# Patient Record
Sex: Female | Born: 1945 | Race: White | Hispanic: No | Smoking: Former smoker
Health system: Southern US, Community
[De-identification: ages and names within clinical notes are randomized; demographics above are authoritative.]

## PROBLEM LIST (undated history)

## (undated) DIAGNOSIS — Z7901 Long term (current) use of anticoagulants: Secondary | ICD-10-CM

## (undated) DIAGNOSIS — K219 Gastro-esophageal reflux disease without esophagitis: Secondary | ICD-10-CM

## (undated) DIAGNOSIS — K589 Irritable bowel syndrome without diarrhea: Secondary | ICD-10-CM

## (undated) DIAGNOSIS — F329 Major depressive disorder, single episode, unspecified: Secondary | ICD-10-CM

## (undated) DIAGNOSIS — F419 Anxiety disorder, unspecified: Secondary | ICD-10-CM

## (undated) DIAGNOSIS — K644 Residual hemorrhoidal skin tags: Secondary | ICD-10-CM

## (undated) DIAGNOSIS — K5790 Diverticulosis of intestine, part unspecified, without perforation or abscess without bleeding: Secondary | ICD-10-CM

## (undated) DIAGNOSIS — I480 Paroxysmal atrial fibrillation: Secondary | ICD-10-CM

## (undated) DIAGNOSIS — K209 Esophagitis, unspecified: Secondary | ICD-10-CM

## (undated) DIAGNOSIS — F32A Depression, unspecified: Secondary | ICD-10-CM

## (undated) DIAGNOSIS — I10 Essential (primary) hypertension: Secondary | ICD-10-CM

## (undated) DIAGNOSIS — T4145XA Adverse effect of unspecified anesthetic, initial encounter: Secondary | ICD-10-CM

## (undated) DIAGNOSIS — M1990 Unspecified osteoarthritis, unspecified site: Secondary | ICD-10-CM

## (undated) DIAGNOSIS — I495 Sick sinus syndrome: Secondary | ICD-10-CM

## (undated) DIAGNOSIS — Z950 Presence of cardiac pacemaker: Secondary | ICD-10-CM

## (undated) DIAGNOSIS — L03317 Cellulitis of buttock: Secondary | ICD-10-CM

## (undated) DIAGNOSIS — E039 Hypothyroidism, unspecified: Secondary | ICD-10-CM

## (undated) DIAGNOSIS — T8859XA Other complications of anesthesia, initial encounter: Secondary | ICD-10-CM

## (undated) DIAGNOSIS — Z9289 Personal history of other medical treatment: Secondary | ICD-10-CM

## (undated) HISTORY — DX: Residual hemorrhoidal skin tags: K64.4

## (undated) HISTORY — PX: OVARIAN CYST REMOVAL: SHX89

## (undated) HISTORY — PX: CATARACT EXTRACTION, BILATERAL: SHX1313

## (undated) HISTORY — DX: Esophagitis, unspecified: K20.9

## (undated) HISTORY — DX: Depression, unspecified: F32.A

## (undated) HISTORY — PX: TONSILLECTOMY: SUR1361

## (undated) HISTORY — PX: FACIAL COSMETIC SURGERY: SHX629

## (undated) HISTORY — DX: Major depressive disorder, single episode, unspecified: F32.9

## (undated) HISTORY — PX: VAGINAL HYSTERECTOMY: SUR661

## (undated) HISTORY — PX: BREAST REDUCTION SURGERY: SHX8

## (undated) HISTORY — PX: FOOT SURGERY: SHX648

## (undated) HISTORY — DX: Irritable bowel syndrome, unspecified: K58.9

## (undated) HISTORY — PX: LIPOSUCTION: SHX10

## (undated) HISTORY — PX: HAND SURGERY: SHX662

## (undated) HISTORY — DX: Diverticulosis of intestine, part unspecified, without perforation or abscess without bleeding: K57.90

---

## 1999-08-08 ENCOUNTER — Encounter: Admission: RE | Admit: 1999-08-08 | Discharge: 1999-08-08 | Payer: Self-pay | Admitting: Orthopedic Surgery

## 1999-08-08 ENCOUNTER — Encounter: Payer: Self-pay | Admitting: Orthopedic Surgery

## 2005-01-10 ENCOUNTER — Emergency Department (HOSPITAL_COMMUNITY): Admission: RE | Admit: 2005-01-10 | Discharge: 2005-01-11 | Payer: Self-pay | Admitting: Internal Medicine

## 2006-04-07 ENCOUNTER — Ambulatory Visit: Payer: Self-pay | Admitting: Gastroenterology

## 2006-07-20 ENCOUNTER — Ambulatory Visit: Payer: Self-pay | Admitting: Gastroenterology

## 2006-09-17 ENCOUNTER — Encounter (INDEPENDENT_AMBULATORY_CARE_PROVIDER_SITE_OTHER): Payer: Self-pay | Admitting: *Deleted

## 2006-09-17 ENCOUNTER — Ambulatory Visit: Payer: Self-pay | Admitting: Gastroenterology

## 2006-09-17 DIAGNOSIS — K209 Esophagitis, unspecified without bleeding: Secondary | ICD-10-CM

## 2006-09-17 HISTORY — DX: Esophagitis, unspecified without bleeding: K20.90

## 2006-12-18 ENCOUNTER — Encounter: Payer: Self-pay | Admitting: Emergency Medicine

## 2006-12-18 ENCOUNTER — Inpatient Hospital Stay (HOSPITAL_COMMUNITY): Admission: EM | Admit: 2006-12-18 | Discharge: 2006-12-20 | Payer: Self-pay | Admitting: Cardiovascular Disease

## 2006-12-29 HISTORY — PX: NM MYOCAR PERF WALL MOTION: HXRAD629

## 2006-12-30 ENCOUNTER — Observation Stay (HOSPITAL_COMMUNITY): Admission: EM | Admit: 2006-12-30 | Discharge: 2006-12-31 | Payer: Self-pay | Admitting: Emergency Medicine

## 2006-12-30 ENCOUNTER — Encounter (INDEPENDENT_AMBULATORY_CARE_PROVIDER_SITE_OTHER): Payer: Self-pay | Admitting: Cardiovascular Disease

## 2008-10-13 IMAGING — CR DG CHEST 1V PORT
1 series · 1 of 1 positions shown · non-contrast
Comparison: None.

CLINICAL DATA: Shortness of breath. 

PORTABLE CHEST - 1 VIEW

[view not recorded]
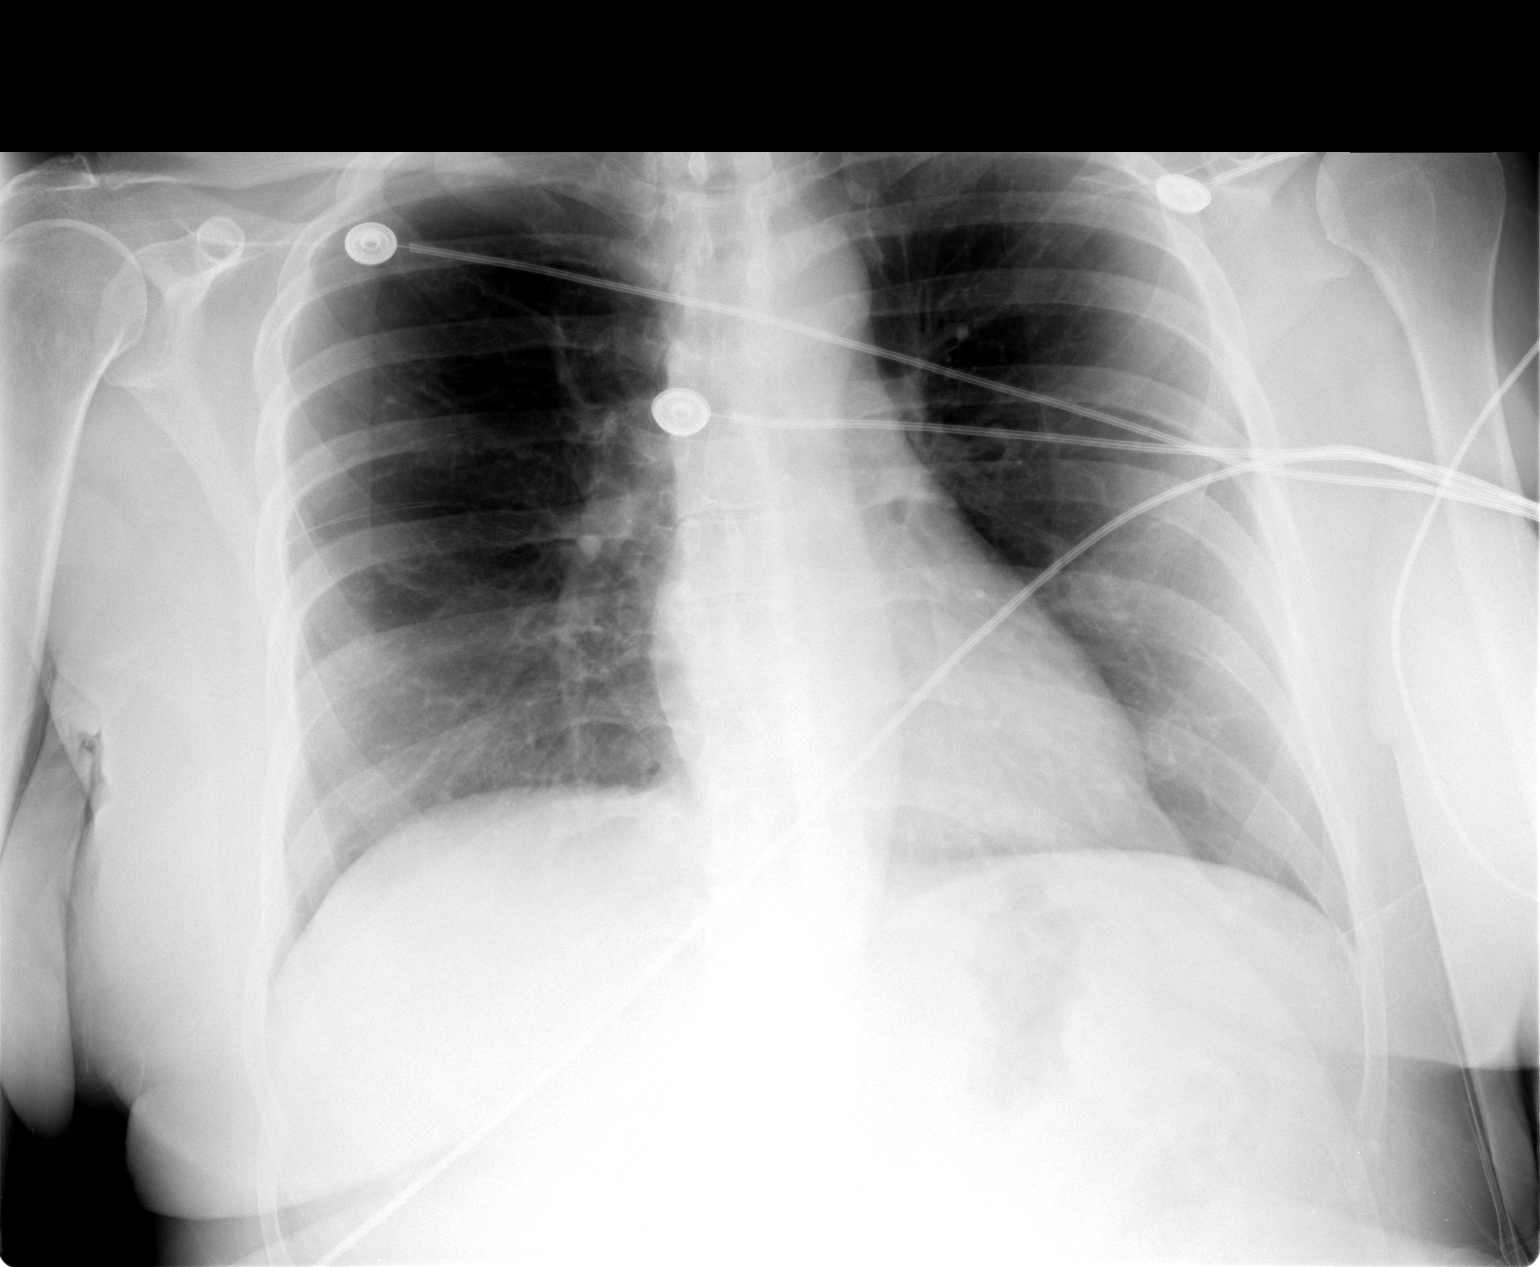

[1 of 1 positions shown; findings below may reference images not displayed]

FINDINGS: Normal sized heart. Clear lungs with normal vascularity. Mildly
tortuous aorta. Minimal scoliosis.  

IMPRESSION

No acute abnormality.

## 2008-12-29 ENCOUNTER — Emergency Department (HOSPITAL_COMMUNITY): Admission: EM | Admit: 2008-12-29 | Discharge: 2008-12-29 | Payer: Self-pay | Admitting: Emergency Medicine

## 2009-12-02 ENCOUNTER — Emergency Department (HOSPITAL_COMMUNITY): Admission: EM | Admit: 2009-12-02 | Discharge: 2009-12-02 | Payer: Self-pay | Admitting: Emergency Medicine

## 2009-12-09 ENCOUNTER — Emergency Department (HOSPITAL_COMMUNITY): Admission: EM | Admit: 2009-12-09 | Discharge: 2009-12-09 | Payer: Self-pay | Admitting: Family Medicine

## 2010-09-01 LAB — CBC
HCT: 37.5 % (ref 36.0–46.0)
Hemoglobin: 12.9 g/dL (ref 12.0–15.0)
MCHC: 34.4 g/dL (ref 30.0–36.0)
RDW: 13.7 % (ref 11.5–15.5)

## 2010-09-01 LAB — DIFFERENTIAL
Basophils Absolute: 0 10*3/uL (ref 0.0–0.1)
Basophils Relative: 1 % (ref 0–1)
Monocytes Relative: 13 % — ABNORMAL HIGH (ref 3–12)
Neutro Abs: 2.7 10*3/uL (ref 1.7–7.7)
Neutrophils Relative %: 56 % (ref 43–77)

## 2010-09-01 LAB — POCT I-STAT, CHEM 8
BUN: 17 mg/dL (ref 6–23)
Calcium, Ion: 1.15 mmol/L (ref 1.12–1.32)
Creatinine, Ser: 1 mg/dL (ref 0.4–1.2)
TCO2: 22 mmol/L (ref 0–100)

## 2010-09-01 LAB — COMPREHENSIVE METABOLIC PANEL
Alkaline Phosphatase: 25 U/L — ABNORMAL LOW (ref 39–117)
BUN: 14 mg/dL (ref 6–23)
Calcium: 9.1 mg/dL (ref 8.4–10.5)
GFR calc non Af Amer: >60 mL/min (ref >60)
Glucose, Bld: 100 mg/dL — ABNORMAL HIGH (ref 70–99)
Potassium: 4.5 mEq/L (ref 3.5–5.1)
Total Protein: 6.1 g/dL (ref 6.0–8.3)

## 2010-09-01 LAB — APTT: aPTT: 26 seconds (ref 24–37)

## 2010-09-01 LAB — PROTIME-INR
INR: 1.28 (ref 0.00–1.49)
Prothrombin Time: 15.9 seconds — ABNORMAL HIGH (ref 11.6–15.2)

## 2010-09-01 LAB — POCT CARDIAC MARKERS: Myoglobin, poc: 49 ng/mL (ref 12–200)

## 2010-09-22 LAB — COMPREHENSIVE METABOLIC PANEL
ALT: 17 U/L (ref 0–35)
AST: 27 U/L (ref 0–37)
Alkaline Phosphatase: 33 U/L — ABNORMAL LOW (ref 39–117)
CO2: 24 mEq/L (ref 19–32)
Chloride: 105 mEq/L (ref 96–112)
GFR calc non Af Amer: >60 mL/min (ref >60)
Glucose, Bld: 96 mg/dL (ref 70–99)
Potassium: 4.2 mEq/L (ref 3.5–5.1)
Sodium: 137 mEq/L (ref 135–145)
Total Bilirubin: 0.3 mg/dL (ref 0.3–1.2)

## 2010-09-22 LAB — PROTIME-INR: Prothrombin Time: 23.5 seconds — ABNORMAL HIGH (ref 11.6–15.2)

## 2010-10-29 NOTE — H&P (Signed)
Jacqueline Kim, Jacqueline Kim                ACCOUNT NO.:  [id number]   MEDICAL RECORD NO.:  [id number]          PATIENT TYPE:  INP   LOCATION:  1823                         FACILITY:  [facility]   PHYSICIAN:  Ulyses Amor, MD DATE OF BIRTH:  01-05-1946   DATE OF ADMISSION:  12/30/2006  DATE OF DISCHARGE:                              HISTORY & PHYSICAL   HISTORY OF PRESENT ILLNESS:  Jacqueline Kim is a 65 year old white woman  who is again admitted to [facility] with a recurrence of atrial  fibrillation with a rapid ventricular rate.  She was last hospitalized  here July 4-6, 2008, for the same issue.  This will make the patient's  third episode of paroxysmal atrial fibrillation.   The patient experienced onset of a rapid and irregular heartbeat this  evening soon after returning home.  There was no associated chest pain,  tightness, heaviness, pressure or squeezing.  No wheezing or any  dyspnea, diaphoresis, nausea, dizziness, lightheadedness, near syncope  or syncope.  She presented to the emergency department where she was  found to be in atrial fibrillation with a rapid ventricular rate.   The patient has no known history of coronary artery disease.  In fact,  she underwent nuclear stress testing yesterday.  The results are  pending.  She does have a history of hypertension.  There is no history  of diabetes mellitus, smoking or dyslipidemia.  There is a family  history of coronary artery disease.  Since presenting to the emergency  department, she has been treated with intravenous Diltiazem which has  resulted in a slowing of her ventricular rate.   OTHER MEDICAL PROBLEMS:  1. Hypothyroidism.  2. Gastroesophageal reflux disease.  3. Glaucoma.   ALLERGIES:  PENICILLIN.   MEDICATIONS:  1. Premarin.  2. Wellbutrin XL.  3. Cozaar.  4. Amlodipine.  5. Toprol XL.  6. Synthroid .  7. Prevacid.  8. Xalatan eye drops.  9. Trusopt eye drops.   SOCIAL HISTORY:  The patient is  married.  She has three children.  She  does not smoke cigarettes.  She drinks occasional alcohol.   FAMILY HISTORY:  Notable for coronary artery disease.   OPERATIONS:  1. Breast surgery.  2. Hand surgery.  3. Left foot surgery.  4. Partial hysterectomy .  5. Tonsillectomy and adenoidectomy.   REVIEW OF SYSTEMS:  No new problems related to her Head, Eyes, Ears,  Nose, Mouth, Throat, Lungs, Gastrointestinal, Genitourinary,  Extremities.  There is no history of neurological/psychiatric disorder.  There is no history of fever, chills or weight loss.   PHYSICAL EXAMINATION:  VITAL SIGNS:  Blood pressure 118/87, pulse 70 and  irregularly irregular, respirations 18, temperature 97.2.  GENERAL:  The patient was a middle age white woman in no discomfort.  She as alert, oriented, appropriate and responsive.  HEENT:  Normal.  NECK:  Without thyromegaly or adenopathy.  Carotid pulses were palpable  bilaterally and without bruits.  CARDIAC:  Irregularly irregular rhythm.  No murmurs, rubs, gallops or  clicks.  No chest wall tenderness was noted.  LUNGS:  Clear.  ABDOMEN:  Soft and nontender.  No mass, hepatosplenomegaly, bruits,  distention, rebound, guarding or rigidity.  Bowel sounds were normal.  BREASTS/PELVIC/RECTAL:  Not performed as not pertinent to the recent  acute care hospitalization.  EXTREMITIES:  Without edema, deviation or deformity.  Radial and  dorsalis pedal pulses were palpable bilaterally.  Brief screening at  neurologic survey was unremarkable.   STUDIES:  Electrocardiogram reveals atrial fibrillation with a  ventricular rate of 132 beats per minute, given nonspecific ST-T wave  changes, probably related to rate.  Potassium 3.2, BUN 11, creatinine  0.8.  Initial set of cardiac markers revealed myoglobin of 34.7, CK MB  less than 1.0, troponin less than 0.05.  Chest radiograph was pending at  the time of this dictation.  The remaining laboratory tests were pending   at the time of this dictation.   IMPRESSION:  1. Recurrent atrial fibrillation with rapid ventricular rate.  History      of paroxysmal atrial fibrillation.  Last hospitalization for atrial      fibrillation was July 4-6, 2008.  2. Hypertension.  3. Hypothyroidism.  4. Gastroesophageal reflux disease.  5. Glaucoma.   PLAN:  1. Telemetry.  2. Serial cardiac enzymes.  3. Aspirin.  4. Intravenous heparin.  5. Intravenous diltiazem.  6. Further measures per Dr. [name].      Ulyses Amor, MD  Electronically Signed     MSC/MEDQ  D:  12/30/2006  T:  12/30/2006  Job:  [id number]   cc:   Nicki Guadalajara, M.D.

## 2010-10-29 NOTE — Discharge Summary (Signed)
Jacqueline Kim, Jacqueline Kim                ACCOUNT NO.:  [id number]   MEDICAL RECORD NO.:  [id number]          PATIENT TYPE:  INP   LOCATION:  2901                         FACILITY:  [facility]   PHYSICIAN:  Dani Gobble, MD       DATE OF BIRTH:  14-May-1946   DATE OF ADMISSION:  12/18/2006  DATE OF DISCHARGE:  12/20/2006                               DISCHARGE SUMMARY   The patient is a 65 year old white married female patient of Dr. Nicki Guadalajara with known paroxysmal atrial fibrillation and hypertension who  came here to [facility] ER with complaints of tachy palpitations.  She  was found to be in atrial fib with rapid ventricular response.  She was  given IV Lopressor 5 mg and did not slow her rhythm and she was then  started on Cardizem. Her heart rate came down after 15 mg an hour. Later  that evening she was transferred to [facility]. At about 7:20, she  apparently converted. However she had a 6-second pause and then a heart  rate in the 30s. Her IV Diltiazem had been at 10.  It was discontinued,  she was given 1/2 Amp atropine and she was seen by Dr. Domingo Sep. She was  then moved to [employer]. Her Toprol was held.  However, she did receive it  December 19, 2006, and her heart rate maintained in the rate of 50-60 and  when she got up and walked it increased to 70.  Her blood pressure was  elevated and her Cozaar was increased and then amlodipine was added on  December 20, 2006. She is pending discharge this afternoon after she gets up  and walks. Her heart rate has been 43 to 50 per nurse at times, this  morning otherwise is running 58-60.  We will decrease his Toprol to 25  mg a day.  Her blood pressure also ranges at rest from 105-130 and Dr.  Domingo Sep suggests outpatient workup including possible catheterization  versus outpatient nuclear test, a 2-D echo and renal Dopplers. All tests  will be deferred to Dr. [name]. She will see him in the office first.   DISCHARGE MEDICATIONS:  1. Premarin 0.3 mg a  day.  2. Wellbutrin XL 300 mg a day.  3. Cozaar 50 mg twice a day.  4. Amlodipine 2.5 mg a day.  5. Toprol XL 25 mg a day.  6. Synthroid 100 mg a day.  7. Prevacid 30 mg a day.  8. Xalatan eye drops both eyes daily.  9. Trusopt eye drops both eyes twice daily.   LABS:  Sodium 142, potassium 3.5, BUN 9, creatinine 0.64, chloride 111,  CO2 27. Her hemoglobin was 11.6, hematocrit 33.8, WBCs 4.7 and platelets  were 197.  Glycosylated hemoglobin was 5.4, CK-MB and troponins were all  negative. Total cholesterol 172, triglycerides 293, HDL was 37, LDL was  76. TSH was 1.886, T3 uptake was 45 which is elevated, normal high range  is 37, and free T4 1.20.   DISCHARGE DIAGNOSES:  1. A paroxysmal atrial fibrillation with rapid ventricular response  recurring.  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  3. Hypertension.  Medications titrated upward.  4. Stress anxiety.  5. Hypothyroidism.  6. Gastroesophageal reflux disease.  7. Chest pain and shortness of breath with palpitations. CK-MB and      troponin negative.  8. History of glaucoma.      Lezlie Octave, N.P.    ______________________________  Dani Gobble, MD    BB/MEDQ  D:  12/20/2006  T:  12/20/2006  Job:  [id number]

## 2010-10-29 NOTE — H&P (Signed)
Jacqueline Kim                ACCOUNT NO.:  [id number]   MEDICAL RECORD NO.:  [id number]          PATIENT TYPE:  OBV   LOCATION:  6523                         FACILITY:  [facility]   PHYSICIAN:  Jacqueline Kim, M.D.     DATE OF BIRTH:  1946/05/20   DATE OF ADMISSION:  12/30/2006  DATE OF DISCHARGE:  12/31/2006                              HISTORY & PHYSICAL   HISTORY OF PRESENT ILLNESS:  Jacqueline Kim is a 65 year old female patient  of Dr. Nicki Kim who came into the hospital again with atrial  fibrillation, rapid ventricular response.  He was seen by Dr. Waldon Kim  who was on call for our service.  She was placed on IV heparin.  She was  given Diltiazem, which slowed her rate.  She was seen by Dr. Tresa Kim and  placed on propafenone 150 mg 2 times a day.  She had converted to sinus  rhythm prior to being started on propafenone.  Her IV Diltiazem was  discontinued.  She underwent 2D echocardiogram pending at this time.  She recently has had a Cardiolite study in the office, which was  negative for any ischemia.  She was seen again by Dr. Tresa Kim on December 31, 2006.  However, her ECG showed normal sinus rhythm.  Her QTc was 469.  Her sodium was 140, her potassium was 3.6.  BUN was 9, and creatinine  was 0.57.  Her blood pressure is 126/52, her pulse is 56, O2 sats were  96%.  Now Dr. Tresa Kim decided to give her a Lovenox injection and continue  her Coumadin that had been started the day before as an outpatient.  She  will not need Lovenox-Coumadin crossover though.  She is maintaining  sinus rhythm.  He will see her in the office on Monday for an EKG.  She  will also need a pro time, INR next week.   DISCHARGE MEDICATIONS:  1. Coumadin 5 mg every day.  2. Premarin 0.3 mg every day.  3. Wellbutrin XL 300 mg every day.  4. Synthroid 100 mcg every day.  5. Aspirin 81 mg every day.  6. Propafenone 150 mg 2 times per day.  7. Cozaar 50 mg every day.  8. Toprol XL 25 mg a day.  9. Xalatan and  Trusopt eye drops, both eyes daily.  Trusopt is twice a      day.  10.Prevacid 30 mg a day.    She will have her blood drawn Monday for Coumadin dosing.  Our office will call her with an appointment to see Dr. Tresa Kim next week,  early.   DISCHARGE DIAGNOSES:  1. Recurrent paroxysmal atrial fibrillation with rapid ventricular      response, now put on antiarrhythmic medication and propafenone.      She is in sinus rhythm at the time of discharge.  2. Anticoagulation:  She was started on Coumadin.  She will have had 2      days of Coumadin by the end of today.  Discharged home on 5 mg a      day.  3. Hypertension.  4. Anxiety.  5. Hypothyroidism.  6. Gastroesophageal reflux disease.  7. Glaucoma.      Jacqueline Kim, N.P.    ______________________________  Jacqueline Kim, M.D.    BB/MEDQ  D:  12/31/2006  T:  01/01/2007  Job:  [id number]   cc:   Jacqueline Kim. Jacqueline Kim, M.D.

## 2010-11-01 NOTE — Assessment & Plan Note (Signed)
[facility]                           GASTROENTEROLOGY OFFICE NOTE   Jacqueline, Kim              MRN:          [id number]  DATE:04/07/2006                            DOB:          1945-09-07    Jacqueline Kim is a patient of Dr. Merri Brunette.   Jacqueline Kim is a patient I have seen in the past and did several procedures on  her.  Unfortunately, I do not have her chart available to me to review her  medical situation at the time, and the results of the procedure.  I will  obtain those, however, to accomplish this.  In any case, she comes in now,  she states that she has had some abdominal pain intermittently with gas and  bloating.  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  She denies any other GI symptoms.  Denies any upper GI  symptoms.   Her family history is noncontributory.   Her past medical history reveals that she has hypertension and has had some  arrhythmias diagnosed six months previous.  She is treated for thyroid  disorder, has some arthritis, and is otherwise in fairly good health.   The social history is only positive for some occasional alcohol.   Review of systems reveals some arthritis, some swelling of her legs,  otherwise is noncontributory.   PHYSICAL EXAMINATION:  She is 5 feet 5-1/2 inches.  Weight is 159.  Blood  pressure 122/70.  Pulse 74 and regular.  Neck, head and extremities are all  basically unremarkable except for her abdomen being somewhat tender to  palpation diffusely.  She had no lymphadenopathy.  Her supraclavicular area  was negative.  Extremities were unremarkable.  Rectal was deferred.   IMPRESSION:  1. Probable irritable bowel syndrome.  2. Minimal gastroesophageal reflux disease symptoms, on Prevacid.  3. Hypertension.  4. Hypothyroidism.  5. Some mild anxiety and depression.  6.  Hypertriglyceridemia.  7. History of allergy to PENICILLIN.  8. Status post tonsillectomy, ovarian cystectomy, foot surgery,      liposuction, multiple hand surgeries, breast reduction, and face lift.   It should be noted that a CT scan was done because of her complaint of  periumbilical pain and showed no  acute findings in the pelvis or the  abdomen.   My recommendation was that we scheduled her for an endo/colon, but I first  would review her previous records.  In the meantime, I would keep her on the  Prevacid or other medications, which include Corgard, Toprol, Prevacid,  Wellbutrin, Synthroid, Premarin, eye drops, multivitamins, fish oil,  Maxzide, and folic acid.            ______________________________  Ulyess Mort, MD      SML/MedQ  DD:  04/07/2006  DT:  04/08/2006  Job #:  [id number]   cc:   Soyla Murphy. Renne Crigler, M.D.

## 2010-11-06 ENCOUNTER — Other Ambulatory Visit: Payer: Self-pay | Admitting: Internal Medicine

## 2010-11-07 ENCOUNTER — Ambulatory Visit
Admission: RE | Admit: 2010-11-07 | Discharge: 2010-11-07 | Disposition: A | Discharge status: Home / Self Care | Payer: Medicare Other | Source: Ambulatory Visit | Attending: Internal Medicine | Admitting: Internal Medicine

## 2010-11-07 MED ORDER — IOHEXOL 300 MG/ML  SOLN
100.0000 mL | Freq: Once | INTRAMUSCULAR | Status: AC | PRN
  Administered 2010-11-07: 100 mL via INTRAVENOUS

## 2011-02-08 ENCOUNTER — Emergency Department (HOSPITAL_COMMUNITY): Payer: Medicare Other

## 2011-02-08 ENCOUNTER — Emergency Department (HOSPITAL_COMMUNITY)
Admission: EM | Admit: 2011-02-08 | Discharge: 2011-02-08 | Disposition: A | Discharge status: Home / Self Care | Payer: Medicare Other | Attending: Emergency Medicine | Admitting: Emergency Medicine

## 2011-02-08 DIAGNOSIS — R000 Tachycardia, unspecified: Secondary | ICD-10-CM | POA: Insufficient documentation

## 2011-02-08 DIAGNOSIS — F329 Major depressive disorder, single episode, unspecified: Secondary | ICD-10-CM | POA: Insufficient documentation

## 2011-02-08 DIAGNOSIS — R9431 Abnormal electrocardiogram [ECG] [EKG]: Secondary | ICD-10-CM | POA: Insufficient documentation

## 2011-02-08 DIAGNOSIS — I10 Essential (primary) hypertension: Secondary | ICD-10-CM | POA: Insufficient documentation

## 2011-02-08 DIAGNOSIS — R42 Dizziness and giddiness: Secondary | ICD-10-CM | POA: Insufficient documentation

## 2011-02-08 DIAGNOSIS — I4891 Unspecified atrial fibrillation: Secondary | ICD-10-CM | POA: Insufficient documentation

## 2011-02-08 DIAGNOSIS — R259 Unspecified abnormal involuntary movements: Secondary | ICD-10-CM | POA: Insufficient documentation

## 2011-02-08 DIAGNOSIS — F3289 Other specified depressive episodes: Secondary | ICD-10-CM | POA: Insufficient documentation

## 2011-02-08 DIAGNOSIS — E039 Hypothyroidism, unspecified: Secondary | ICD-10-CM | POA: Insufficient documentation

## 2011-02-08 DIAGNOSIS — Z79899 Other long term (current) drug therapy: Secondary | ICD-10-CM | POA: Insufficient documentation

## 2011-02-08 LAB — CBC
HCT: 34.9 % — ABNORMAL LOW (ref 36.0–46.0)
Hemoglobin: 12.3 g/dL (ref 12.0–15.0)
MCH: 33 pg (ref 26.0–34.0)
MCHC: 35.2 g/dL (ref 30.0–36.0)
MCV: 93.6 fL (ref 78.0–100.0)
Platelets: 222 K/uL (ref 150–400)
RBC: 3.73 MIL/uL — ABNORMAL LOW (ref 3.87–5.11)
RDW: 13 % (ref 11.5–15.5)
WBC: 4.5 K/uL (ref 4.0–10.5)

## 2011-02-08 LAB — CK TOTAL AND CKMB (NOT AT ARMC): CK, MB: 3.8 ng/mL (ref 0.3–4.0)

## 2011-02-08 LAB — URINALYSIS, MICROSCOPIC ONLY
Glucose, UA: NEGATIVE mg/dL
Ketones, ur: NEGATIVE mg/dL
Leukocytes, UA: NEGATIVE
Nitrite: NEGATIVE
Protein, ur: NEGATIVE mg/dL
pH: 5.5 (ref 5.0–8.0)

## 2011-02-08 LAB — BASIC METABOLIC PANEL
CO2: 24 mEq/L (ref 19–32)
Calcium: 9.7 mg/dL (ref 8.4–10.5)
Chloride: 102 mEq/L (ref 96–112)
Creatinine, Ser: 0.54 mg/dL (ref 0.50–1.10)
Glucose, Bld: 100 mg/dL — ABNORMAL HIGH (ref 70–99)

## 2011-02-08 LAB — TROPONIN I: Troponin I: <0.3 ng/mL (ref <0.30)

## 2011-03-31 LAB — B-NATRIURETIC PEPTIDE (CONVERTED LAB): Pro B Natriuretic peptide (BNP): 125 — ABNORMAL HIGH

## 2011-03-31 LAB — CBC
Hemoglobin: 11.3 — ABNORMAL LOW
Hemoglobin: 11.9 — ABNORMAL LOW
Hemoglobin: 12
MCHC: 33.9
MCHC: 34
Platelets: 243
Platelets: 262
RDW: 13.2
RDW: 13.2
RDW: 13.3

## 2011-03-31 LAB — I-STAT 8, (EC8 V) (CONVERTED LAB)
BUN: 11
Bicarbonate: 20.8
HCT: 39
Operator id: 282201
pCO2, Ven: 35.5 — ABNORMAL LOW
pH, Ven: 7.376 — ABNORMAL HIGH

## 2011-03-31 LAB — CK TOTAL AND CKMB (NOT AT ARMC)
CK, MB: 1.8
Relative Index: INVALID
Total CK: 69

## 2011-03-31 LAB — DIFFERENTIAL
Lymphocytes Relative: 43
Monocytes Absolute: 0.3
Monocytes Relative: 8
Neutro Abs: 1.7
Neutrophils Relative %: 43

## 2011-03-31 LAB — BASIC METABOLIC PANEL
CO2: 23
Calcium: 8.6
Creatinine, Ser: 0.57
GFR calc Af Amer: >60
GFR calc non Af Amer: >60
Glucose, Bld: 98
Sodium: 140

## 2011-03-31 LAB — COMPREHENSIVE METABOLIC PANEL
Albumin: 3.2 — ABNORMAL LOW
BUN: 10
Calcium: 8.6
Creatinine, Ser: 0.71
Total Protein: 5.6 — ABNORMAL LOW

## 2011-03-31 LAB — POCT CARDIAC MARKERS
CKMB, poc: <1 — ABNORMAL LOW
Myoglobin, poc: 34.7
Troponin i, poc: <0.05

## 2011-03-31 LAB — CARDIAC PANEL(CRET KIN+CKTOT+MB+TROPI)
CK, MB: 1.8
CK, MB: 1.8
Relative Index: INVALID
Total CK: 64

## 2011-03-31 LAB — PROTIME-INR
INR: 0.9
INR: 1
Prothrombin Time: 12.7
Prothrombin Time: 13.7

## 2011-03-31 LAB — TSH: TSH: 3.268

## 2011-03-31 LAB — POCT I-STAT CREATININE
Creatinine, Ser: 0.7
Creatinine, Ser: 0.8

## 2011-03-31 LAB — HEPARIN LEVEL (UNFRACTIONATED)
Heparin Unfractionated: 0.37
Heparin Unfractionated: 0.9 — ABNORMAL HIGH

## 2011-03-31 LAB — APTT: aPTT: 24

## 2011-04-01 ENCOUNTER — Observation Stay (HOSPITAL_COMMUNITY)
Admission: EM | Admit: 2011-04-01 | Discharge: 2011-04-02 | Disposition: A | Discharge status: Home / Self Care | Payer: Medicare Other | Attending: Cardiology | Admitting: Cardiology

## 2011-04-01 ENCOUNTER — Emergency Department (HOSPITAL_COMMUNITY): Payer: Medicare Other

## 2011-04-01 DIAGNOSIS — F329 Major depressive disorder, single episode, unspecified: Secondary | ICD-10-CM | POA: Insufficient documentation

## 2011-04-01 DIAGNOSIS — Z9119 Patient's noncompliance with other medical treatment and regimen: Secondary | ICD-10-CM | POA: Insufficient documentation

## 2011-04-01 DIAGNOSIS — F3289 Other specified depressive episodes: Secondary | ICD-10-CM | POA: Insufficient documentation

## 2011-04-01 DIAGNOSIS — Z91199 Patient's noncompliance with other medical treatment and regimen due to unspecified reason: Secondary | ICD-10-CM | POA: Insufficient documentation

## 2011-04-01 DIAGNOSIS — R002 Palpitations: Secondary | ICD-10-CM | POA: Insufficient documentation

## 2011-04-01 DIAGNOSIS — I4891 Unspecified atrial fibrillation: Principal | ICD-10-CM | POA: Insufficient documentation

## 2011-04-01 DIAGNOSIS — R0602 Shortness of breath: Secondary | ICD-10-CM | POA: Insufficient documentation

## 2011-04-01 DIAGNOSIS — E039 Hypothyroidism, unspecified: Secondary | ICD-10-CM | POA: Insufficient documentation

## 2011-04-01 DIAGNOSIS — F411 Generalized anxiety disorder: Secondary | ICD-10-CM | POA: Insufficient documentation

## 2011-04-01 DIAGNOSIS — Z7901 Long term (current) use of anticoagulants: Secondary | ICD-10-CM | POA: Insufficient documentation

## 2011-04-01 DIAGNOSIS — I10 Essential (primary) hypertension: Secondary | ICD-10-CM | POA: Insufficient documentation

## 2011-04-01 LAB — DIFFERENTIAL
Basophils Absolute: 0 10*3/uL (ref 0.0–0.1)
Basophils Relative: 1 % (ref 0–1)
Basophils Relative: 1
Eosinophils Absolute: 0.3 10*3/uL (ref 0.0–0.7)
Eosinophils Absolute: 0.2
Eosinophils Relative: 6 — ABNORMAL HIGH
Lymphocytes Relative: 40
Lymphs Abs: 1.9
Lymphs Abs: 1.9
Monocytes Absolute: 0.4
Monocytes Absolute: 0.4
Monocytes Relative: 15 % — ABNORMAL HIGH (ref 3–12)
Monocytes Relative: 8
Monocytes Relative: 9
Neutro Abs: 1 10*3/uL — ABNORMAL LOW (ref 1.7–7.7)
Neutro Abs: 1.7
Neutrophils Relative %: 33 % — ABNORMAL LOW (ref 43–77)
Neutrophils Relative %: 45

## 2011-04-01 LAB — CBC
HCT: 34.4 — ABNORMAL LOW
Hemoglobin: 12.7 g/dL (ref 12.0–15.0)
Hemoglobin: 11.6 — ABNORMAL LOW
Hemoglobin: 11.8 — ABNORMAL LOW
MCH: 32.4 pg (ref 26.0–34.0)
MCHC: 34.5
MCHC: 34.3
MCV: 90.5
MCV: 91.5
MCV: 92.5
Platelets: 201 10*3/uL (ref 150–400)
RBC: 3.92 MIL/uL (ref 3.87–5.11)
RBC: 4.19
RBC: 3.66 — ABNORMAL LOW
RBC: 3.75 — ABNORMAL LOW
RDW: 13.3
WBC: 3.1 10*3/uL — ABNORMAL LOW (ref 4.0–10.5)
WBC: 4.2

## 2011-04-01 LAB — BASIC METABOLIC PANEL
BUN: 15 mg/dL (ref 6–23)
BUN: 13
CO2: 24 mEq/L (ref 19–32)
CO2: 27
Calcium: 10.1 mg/dL (ref 8.4–10.5)
Calcium: 8.6
Chloride: 106
Chloride: 111
GFR calc Af Amer: >60
GFR calc Af Amer: >60
Glucose, Bld: 92 mg/dL (ref 70–99)
Potassium: 3.5
Potassium: 3.8
Sodium: 139 mEq/L (ref 135–145)
Sodium: 142

## 2011-04-01 LAB — POCT CARDIAC MARKERS
Myoglobin, poc: 34.6
Operator id: 1627
Troponin i, poc: <0.05

## 2011-04-01 LAB — LIPID PANEL
LDL Cholesterol: 76
Total CHOL/HDL Ratio: 4.6
VLDL: 59 — ABNORMAL HIGH

## 2011-04-01 LAB — COMPREHENSIVE METABOLIC PANEL
AST: 29
CO2: 26
Calcium: 9.2
Creatinine, Ser: 0.79
GFR calc Af Amer: >60
GFR calc non Af Amer: >60
Glucose, Bld: 116 — ABNORMAL HIGH
Total Protein: 6.3

## 2011-04-01 LAB — TSH
TSH: 1.293
TSH: 1.886
TSH: 9.156 u[IU]/mL — ABNORMAL HIGH (ref 0.350–4.500)

## 2011-04-01 LAB — CARDIAC PANEL(CRET KIN+CKTOT+MB+TROPI): CK, MB: 1.3

## 2011-04-01 LAB — CK TOTAL AND CKMB (NOT AT ARMC)
CK, MB: 3.2 ng/mL (ref 0.3–4.0)
CK, MB: 2.9 ng/mL (ref 0.3–4.0)
Relative Index: INVALID (ref 0.0–2.5)

## 2011-04-01 LAB — HEPATIC FUNCTION PANEL
Bilirubin, Direct: <0.1 mg/dL (ref 0.0–0.3)
Total Bilirubin: 0.3 mg/dL (ref 0.3–1.2)

## 2011-04-01 LAB — APTT: aPTT: 33 seconds (ref 24–37)

## 2011-04-01 LAB — POCT I-STAT TROPONIN I: Troponin i, poc: 0.01 ng/mL (ref 0.00–0.08)

## 2011-04-01 LAB — HEMOGLOBIN A1C: Mean Plasma Glucose: 105 mg/dL (ref <117)

## 2011-04-01 LAB — PROTIME-INR: INR: 1.12 (ref 0.00–1.49)

## 2011-04-01 LAB — TROPONIN I: Troponin I: <0.3 ng/mL (ref <0.30)

## 2011-04-02 LAB — CK TOTAL AND CKMB (NOT AT ARMC)
CK, MB: 2.5 ng/mL (ref 0.3–4.0)
Relative Index: INVALID (ref 0.0–2.5)

## 2011-04-02 LAB — CBC
HCT: 32.1 % — ABNORMAL LOW (ref 36.0–46.0)
Hemoglobin: 10.7 g/dL — ABNORMAL LOW (ref 12.0–15.0)
RBC: 3.32 MIL/uL — ABNORMAL LOW (ref 3.87–5.11)

## 2011-04-02 LAB — BASIC METABOLIC PANEL
CO2: 26 mEq/L (ref 19–32)
Glucose, Bld: 84 mg/dL (ref 70–99)
Potassium: 3.8 mEq/L (ref 3.5–5.1)
Sodium: 140 mEq/L (ref 135–145)

## 2011-04-02 LAB — TROPONIN I: Troponin I: <0.3 ng/mL (ref <0.30)

## 2011-04-04 NOTE — Discharge Summary (Signed)
Jacqueline Kim, Jacqueline Kim                ACCOUNT NO.:  [id number]  MEDICAL RECORD NO.:  [id number]  LOCATION:  2040                         FACILITY:  [facility]  PHYSICIAN:  Landry Corporal, MD DATE OF BIRTH:  10-Sep-1945  DATE OF ADMISSION:  04/01/2011 DATE OF DISCHARGE:  04/02/2011                              DISCHARGE SUMMARY   DISCHARGE DIAGNOSES: 1. Paroxysmal atrial fibrillation with rapid ventricular response with     conversion to sinus rhythm with an approximate 7.5-second pause. 2. Noncompliance with medications and missing doses of medications. 3. Hypothyroidism. 4. Second episode of paroxysmal atrial fibrillation this year, first     also followed with presumed pause that caused near syncope. 5. Anxiety.  DISCHARGE CONDITION:  Improved.  PROCEDURES:  None.  DISCHARGE MEDICATIONS:  See medication reconciliation.  We did not change any medications.  We did ask her to take medications as instructed.  DISCHARGE INSTRUCTIONS:  Activity as tolerated.  Heart-healthy diet. Follow up with Dr. [name] on previously arranged appointment, April 23, 2011.  HOSPITAL COURSE:  A 65 year old female who came to [facility] Emergency Room on April 01, 2011, after being awakened from sleep around 3 am with palpitations.  She was seen in the ER and started on IV Cardizem for atrial fibrillation with RVR.  She had shortness of breath and minimal chest discomfort with this, otherwise at rest, she was asymptomatic.  With activity, she had dyspnea on exertion.  She had seen Dr. Alanda Amass who was on-call for Dr. [name] in the office after her most recent episode of AFib in August 2012.  Dr. Alanda Amass related in his note that he would prefer the patient to be admitted overnight if she had any recurrent atrial fibrillation, so we could monitor her conversion because she states they are very profound, and she almost passed out.  In the emergency room, she received 10-mg bolus of Cardizem and  a drip at 5 mg.  We then increased it to 10 mg but when she was admitted to tele bed, she would have tachycardia and then would have up to 2-second pauses, so we decreased the rate of the Cardizem to 5 mg an hour.  She was restarted on her usual dose of Rythmol 150 t.i.d. and metoprolol 25 b.i.d. and continued on her Pradaxa.  She then converted later that day to sinus bradycardia and then up to sinus rhythm.  She does relate at home that her pulse normally runs in the 50s, sometimes she is just very fatigued with that and this is probably why she does not take her medications appropriately.  On her Rythmol, she is only taking it twice a day instead of 3 times a day.  On the metoprolol, she forgot to get it refilled and then forgot to pick it up once it was refilled, so she missed a total of 5 doses of Lopressor.  The patient is stable on the morning of October, 17, 2012, and was seen by Dr. Clarene Duke.  He discussed with her possibility of permanent pacemaker with long pauses of conversions.  Strips of that conversion will be taken to Dr. [name], so when he sees her in the office, he  will decide if he wants her to proceed with pacemaker.  LABORATORY DATA:  Sodium 140, potassium 3.8, BUN 14, creatinine 0.81, glucose 84.  Hemoglobin 10.7, hematocrit 32.1, platelets 174, WBC 3.5. TSH is elevated at 9.156.  She has had multiple thyroid adjustments.  We will have her follow up with Dr. Thea Silversmith for follow up of that. Hemoglobin A1c was 5.3.  Magnesium was 1.6.  We will give her a dose of magnesium prior to her discharge.  Dr. Clarene Duke saw her and assessed her and felt she was stable for discharge.  We will have her ambulate in the hall and she will be discharged home with follow up with Dr. [name]. Please note chest x-ray had minimal bronchitic changes, right basilar atelectasis.     Jacqueline Kim, N.P.   ______________________________ Landry Corporal, MD    LRI/MEDQ  D:   04/02/2011  T:  04/02/2011  Job:  [id number]  cc:   Nicki Guadalajara, M.D. Thayer Headings, M.D.  Electronically Signed by Nada Boozer N.P. on 04/03/2011 01:23:04 PM Electronically Signed by Bryan Lemma MD on 04/04/2011 03:14:18 PM

## 2011-04-06 ENCOUNTER — Inpatient Hospital Stay (HOSPITAL_COMMUNITY)
Admission: EM | Admit: 2011-04-06 | Discharge: 2011-04-09 | DRG: 244 | Disposition: A | Discharge status: Home / Self Care | Payer: Medicare Other | Attending: Internal Medicine | Admitting: Internal Medicine

## 2011-04-06 DIAGNOSIS — Z79899 Other long term (current) drug therapy: Secondary | ICD-10-CM

## 2011-04-06 DIAGNOSIS — I10 Essential (primary) hypertension: Secondary | ICD-10-CM | POA: Diagnosis present

## 2011-04-06 DIAGNOSIS — I4891 Unspecified atrial fibrillation: Secondary | ICD-10-CM | POA: Diagnosis present

## 2011-04-06 DIAGNOSIS — K219 Gastro-esophageal reflux disease without esophagitis: Secondary | ICD-10-CM | POA: Diagnosis present

## 2011-04-06 DIAGNOSIS — I498 Other specified cardiac arrhythmias: Principal | ICD-10-CM | POA: Diagnosis present

## 2011-04-06 DIAGNOSIS — E039 Hypothyroidism, unspecified: Secondary | ICD-10-CM | POA: Diagnosis present

## 2011-04-06 LAB — CBC
Hemoglobin: 11.3 g/dL — ABNORMAL LOW (ref 12.0–15.0)
MCHC: 33.7 g/dL (ref 30.0–36.0)
RBC: 3.52 MIL/uL — ABNORMAL LOW (ref 3.87–5.11)
WBC: 3.4 10*3/uL — ABNORMAL LOW (ref 4.0–10.5)

## 2011-04-06 LAB — COMPREHENSIVE METABOLIC PANEL
AST: 20 U/L (ref 0–37)
Albumin: 3.5 g/dL (ref 3.5–5.2)
Chloride: 102 mEq/L (ref 96–112)
Creatinine, Ser: 0.98 mg/dL (ref 0.50–1.10)
Total Bilirubin: 0.4 mg/dL (ref 0.3–1.2)

## 2011-04-06 LAB — DIFFERENTIAL
Basophils Absolute: 0 10*3/uL (ref 0.0–0.1)
Basophils Relative: 1 % (ref 0–1)
Monocytes Absolute: 0.4 10*3/uL (ref 0.1–1.0)
Neutro Abs: 1.4 10*3/uL — ABNORMAL LOW (ref 1.7–7.7)
Neutrophils Relative %: 40 % — ABNORMAL LOW (ref 43–77)

## 2011-04-06 LAB — POCT I-STAT TROPONIN I: Troponin i, poc: 0 ng/mL (ref 0.00–0.08)

## 2011-04-07 LAB — BASIC METABOLIC PANEL
BUN: 17 mg/dL (ref 6–23)
Chloride: 106 mEq/L (ref 96–112)
GFR calc Af Amer: 84 mL/min — ABNORMAL LOW (ref >90)
Potassium: 3.8 mEq/L (ref 3.5–5.1)

## 2011-04-07 LAB — PROTIME-INR
INR: 1.21 (ref 0.00–1.49)
Prothrombin Time: 15.6 seconds — ABNORMAL HIGH (ref 11.6–15.2)

## 2011-04-07 LAB — TSH: TSH: 8.342 u[IU]/mL — ABNORMAL HIGH (ref 0.350–4.500)

## 2011-04-07 LAB — APTT: aPTT: 38 seconds — ABNORMAL HIGH (ref 24–37)

## 2011-04-07 LAB — MAGNESIUM: Magnesium: 1.8 mg/dL (ref 1.5–2.5)

## 2011-04-08 HISTORY — PX: PACEMAKER INSERTION: SHX728

## 2011-04-08 LAB — CBC
Platelets: 201 10*3/uL (ref 150–400)
RBC: 3.7 MIL/uL — ABNORMAL LOW (ref 3.87–5.11)
WBC: 3.6 10*3/uL — ABNORMAL LOW (ref 4.0–10.5)

## 2011-04-08 LAB — BASIC METABOLIC PANEL
Calcium: 9.6 mg/dL (ref 8.4–10.5)
GFR calc non Af Amer: 85 mL/min — ABNORMAL LOW (ref >90)
Sodium: 140 mEq/L (ref 135–145)

## 2011-04-08 LAB — APTT: aPTT: 26 seconds (ref 24–37)

## 2011-04-08 LAB — PROTIME-INR
INR: 0.97 (ref 0.00–1.49)
Prothrombin Time: 13.1 seconds (ref 11.6–15.2)

## 2011-04-08 NOTE — H&P (Signed)
NAMEWYNDI, Jacqueline Kim                ACCOUNT NO.:  [id number]  MEDICAL RECORD NO.:  [id number]  LOCATION:  2041                         FACILITY:  [facility]  PHYSICIAN:  Italy Hilty, MD         DATE OF BIRTH:  1945-12-10  DATE OF ADMISSION:  04/06/2011 DATE OF DISCHARGE:                             HISTORY & PHYSICAL   CHIEF COMPLAINT:  Irregular heart rate, very dizzy.  HISTORY OF PRESENT ILLNESS:  A 65 year old white married female, just discharged from [facility] on April 02, 2011, after admission for paroxysmal AFib with RVR with hospital course of started on Cardizem drip at that time.  Her outpatient meds were restarted.  She had missed 5 doses of Lopressor and was only taking her sotalol twice a day instead of q.8 hours.  Later that same day, she converted to sinus rhythm  after a long pause of approximately 7.5 seconds, though there was a small junctional beat between these pauses I am not sure that it was hemodynamicallyhelpful to her.  Initially, she did well but on April 05, 2011, she called our office and stated she was out shopping and felt suddenly very very dizzy and thought she might pass out.  She thought she had gone back in the AFib and had come out  again, but she was not aware of any AFib.  She was offered to come to the emergency room versus follow up visit Monday, she wanted to wait until Monday if she could, she felt better after she got home and rested.  Then this morning she was up and about and was very dizzy.  Her husband stated she was having 2 beats and then no beat, so we asked her to come in for further evaluation.  She came in and is in symptomatic bradycardia with heart rates down to 39 at times.  When she is lying flat, she has no complaints.  At the last visit, we had discussed permanent pacemaker.  She does have sick sinus syndrome, i.e. tachybrady syndrome with both bradycardia and AFib with rapid ventricular response.  When she takes her  medications appropriately, she develops bradycardia.  Plan will be for permanent pacemaker on Tuesday of this week.  Additionally, the patient had some how come off her Synthroid for her hypothyroidism and on her last admission, her TSH was 9.  She has resumed her Thyroid.  PAST MEDICAL HISTORY:  Paroxysmal AFib as stated.  No coronary disease and had a negative nuclear stress test in July of 2008, with EF of 69%.  Her last echo in May of 2010 revealed mild mitral regurg, mild tricuspid regurg, EF was 55%.  Other history includes hypothyroidism as stated previously, hypertension, and gastroesophageal reflux disease.  She had a history of glaucoma, but after cataracts were removed, her pressures have been stable.  ALLERGY:  Anaphylaxis with PENICILLIN, AMBIEN she cannot take.  FAMILY HISTORY:  No coronary disease.  SOCIAL HISTORY:  Married with children and grandchildren.  Does not use tobacco or alcohol.  She is active, but does not follow exercise plans.  REVIEW OF SYSTEMS:  GENERAL:  Very dizzy today, lightheaded secondary to arrhythmia.  Otherwise no complaints.  No colds or fevers.  SKIN:  No rashes or ulcers.  HEENT:  No blurred vision.  GI:  No diarrhea, constipation, or melena.  GU: No hematuria or dysuria.  ENDOCRINE:  With elevated TSH, she had somehow come off her Synthroid.  No history of diabetes.  MUSCULOSKELETAL:  Negative.  CARDIOVASCULAR:  As stated. PULMONARY:  Negative for shortness of breath.  NEUROLOGIC:  No syncope.  OUTPATIENT MEDICATIONS: 1. Pradaxa 150 mg p.o. b.i.d. 2. Losartan 100 mg half a tablet daily. 3. Metoprolol tartrate 25 mg b.i.d. 4. Bupropion XL 300 mg daily. 5. Rythmol 150 mg 1 every 8 hours. 6. Synthroid 0.1 mg daily. 7. Sonata 10 mg 1 p.o. at bedtime p.r.n. 8. Trazodone 150 mg 1 p.o. at bedtime daily. 9. Biotin over-the-counter 1 daily. 10.Calcium carbonate with vitamin D 1 daily. 11.Multivitamin 1 daily. 12.Mega Red OTC  daily. 13.Omega-3 1 g daily.  PHYSICAL EXAMINATION:  VITAL SIGNS:  Blood pressure 181/85, pulse 56, respirations 16, temp 98.5, oxygen saturation 97%. GENERAL:  Alert, oriented white female, in no acute distress, but extremely anxious. SKIN:  Warm and dry.  Brisk capillary refill. EYES:  Pupils equal, round, and reactive to light.  Acuity, sclerae clear. NECK:  Supple.  No JVD. HEART:  Bradycardic, S1 and S2.  No S3. LUNGS:  Clear without rales, rhonchi, or wheezes. ABDOMEN:  Soft, nontender, positive bowel sounds. EXTREMITIES:  2+ pedals bilaterally.  No edema. NEURO:  Alert and oriented x3.  Moves all extremities.  No focal defects.  IMPRESSION: 1. Symptomatic bradycardia. 2. Atrial fibrillation with tachybrady syndrome with need for     permanent transvenous pacemaker.  PLAN:  We will decrease her Lopressor to 12.5 mg b.i.d. and continue Rythmol per Dr. Rennis Golden.  We will plan pacemaker on Tuesday with Dr. Royann Shivers.  The patient is agreeable to the plan.  Laboratory data is pending.  We will admit her to a telemetry bed as an inpatient for further monitoring, also give her something for her anxiety as well.     Darcella Gasman. Annie Paras, N.P.   ______________________________ [place] Hilty, MD    LRI/MEDQ  D:  04/06/2011  T:  04/06/2011  Job:  [id number]  cc:   Dr. Lorrene Reid, M.D.  Electronically Signed by Nada Boozer N.P. on 04/07/2011 01:50:35 PM Electronically Signed by Kirtland Bouchard. HILTY M.D. on 04/08/2011 08:25:32 AM

## 2011-04-09 ENCOUNTER — Inpatient Hospital Stay (HOSPITAL_COMMUNITY): Payer: Medicare Other

## 2011-04-09 NOTE — Op Note (Signed)
Jacqueline Kim, Jacqueline Kim                ACCOUNT NO.:  [id number]  MEDICAL RECORD NO.:  [id number]  LOCATION:  2041                         FACILITY:  [facility]  PHYSICIAN:  Thurmon Fair, MD     DATE OF BIRTH:  11-Jul-1945  DATE OF PROCEDURE:  04/08/2011 DATE OF DISCHARGE:                              OPERATIVE REPORT   PROCEDURES PERFORMED: 1. Implantation of new dual-chamber permanent pacemaker. 2. Moderate sedation. 3. Fluoroscopy.  REASON FOR THE PROCEDURE: 1. Sinus node dysfunction with sinus pauses in excess of 6 seconds and     syncope. 2. 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 3. Paroxysmal atrial fibrillation with rapid ventricular response. 4. Symptomatic bradycardia due to necessary medications.  MEDICATIONS ADMINISTERED DURING THE PROCEDURE:  Ancef 1 g intravenously, Versed and fentanyl intravenously for moderate sedation, lidocaine 1% 30 mL locally.  SURGEON:  Thurmon Fair, MD  ASSISTANT:  Smokey Twine, RCIS COMPLICATIONS:  None.  ESTIMATED BLOOD LOSS:  Less than 10 mL.  DESCRIPTION OF PROCEDURE:  After risks and benefits of the procedure were described, the patient provided informed consent and was prepped and draped in the usual sterile fashion.  Local anesthesia with 1% lidocaine was administered to the left infraclavicular area and a 5-6-cm horizontal incision was made roughly 3 cm caudal to and parallel with the inferior border of the left clavicle.  Using electrocautery and blunt dissection, a pocket was created down to the level of the pectoralis major fascia with great care being taken to ensure good hemostasis. An antibiotic-soaked sponge was placed in the pocket.  Under fluoroscopic guidance and using 2 separate venipunctures, 2-separate J tipped guidewires were placed in the left subclavian vein and subsequently exchanged for 8-French safe sheaths.  Under fluoroscopic guidance, the ventricular lead was advanced to the level  of the mid-to-apical right ventricular septum and the active fixation helix was deployed.  There was prominent current of injury. Stimulation at maximum device output did not produce any diaphragmatic/phrenic nerve stimulation.  Good sensing and pacing parameters were seen.  The safe sheath was peeled away and the lead was secured in place using 2-0 silk.  In a similar fashion, the atrial lead was advanced to level of the right atrial appendage.  The active fixation helix was deployed.  Prominent current of injury was seen. Good sensing, pacing, and impedance parameters were noted.  The safe sheath was peeled away and the lead was secured in place using 2-0 silk.  The antibiotic-soaked sponge was then removed from the pocket and the pocket was flushed with copious amounts of antibiotic solution. Reinspection showed excellent hemostasis.  The generator was then attached to the leads with appropriate ventricular pacing and subsequently atrial paced ventricular sensed rhythm noted on the monitor.  The generator was placed in the pocket with care being taken that the lead be located deep to the generator.  The pocket was then closed in layers using 2 layers of 2-0 Vicryl and 1 layer of 4-0 Vicryl.  Steri-Strips and then a sterile dressing were applied.  No immediate complications occurred.  DEVICE DETAILS:  The generator is a Medtronic MRI safe Revo dual-chamber pacemaker, model #RVDR01, serial [id number].  The right  atrial lead is an MRI safe lead Medtronic [id number], MRI - 45 cm serial #[id number].  The ventricular lead is a Medtronic [id number], MRI - 52 cm, serial #[id number].  The following electronic parameters were recorded at the end of the case.  Right atrial lead sensed P-waves between 1.7 and 2.1 mV, impedance 1325 ohms, threshold of 0.7 V at 0.5 msec pulse width.  Right ventricular lead sensed R-waves 10 mV, impedance 1321 ohms, threshold 1 V at 0.5 msec pulse  width.     Thurmon Fair, MD     MC/MEDQ  D:  04/08/2011  T:  04/08/2011  Job:  [id number]  cc:   [facility]  Electronically Signed by Thurmon Fair M.D. on 04/09/2011 08:19:42 AM

## 2011-04-17 NOTE — Discharge Summary (Signed)
  NAMEBECKIE, Kim                ACCOUNT NO.:  [id number]  MEDICAL RECORD NO.:  [id number]  LOCATION:  2041                         FACILITY:  [facility]  PHYSICIAN:  Jacqueline Fair, MD     DATE OF BIRTH:  May 22, 1946  DATE OF ADMISSION:  04/06/2011 DATE OF DISCHARGE:  04/09/2011                              DISCHARGE SUMMARY   DISCHARGE DIAGNOSES: 1. Symptomatic bradycardia, status post elective Medtronic pacemaker     implant this admission by Dr. Royann Shivers. 2. Paroxysmal atrial fibrillation with brady-tachy syndrome, the     patient is on Rythmol and Pradaxa. 3. Treated hypertension. 4. Treated hypothyroidism.  HOSPITAL COURSE:  The patient is a 65 year old female who was recently admitted to [facility] with paroxysmal atrial fibrillation.  She had missed several of her outpatient medications.  She converted to sinus rhythm during her last admission after a long pause.  She was sent home in sinus rhythm.  New medications did well until April 05, 2011, when she called our office and said she had a near syncopal spell.  She was urged to go the emergency room then, but she wanted to wait until Monday and see [place] in the office.  She was seen April 06, 2011, and was noted to be somewhat bradycardic.  It is felt she would require a pacemaker. She was admitted.  Her metoprolol was cut back to 25 mg b.i.d., and her Rythmol was continued.  She was set up for pacemaker implant which was done on April 08, 2011, by Dr. Royann Shivers.  She tolerated this well.  We feel she can be discharged April 09, 2011.  We have increased her metoprolol back to her 25 mg b.i.d.  For now, we will continue her Rythmol.  She is also on Pradaxa which had been held on admission.  This will be resumed.  Please see med rec for complete discharge medications.  LABORATORY DATA:  TSH 8.32, apparently her Synthroid had recently been resumed as an outpatient.  Sodium 140, potassium 3.8, BUN 17, creatinine 0.8,  CO2 27, chloride 106 and INR 1.2.  White count 3.6, hemoglobin 11.8, hematocrit 35.6 and platelets 201.  Chest x-ray shows a pacemaker in place with no pneumothorax.  Telemetry is paced.  DISPOSITION:  The patient is discharged in stable condition and will follow up in a week with Dr. Royann Shivers.  She knows to contact us if she has any increased swelling or pain or drainage at her pacer site.     Jacqueline Kim, P.A.   ______________________________ Jacqueline Fair, MD    LKK/MEDQ  D:  04/09/2011  T:  04/09/2011  Job:  [id number]  cc:   Jacqueline Fair, MD Jacqueline Kim, M.D. Jacqueline Kim, M.D.  Electronically Signed by Jacqueline Kim P.A. on 04/14/2011 09:55:11 AM Electronically Signed by Jacqueline Kim M.D. on 04/17/2011 11:07:07 AM

## 2011-05-27 ENCOUNTER — Inpatient Hospital Stay (HOSPITAL_COMMUNITY): Payer: Medicare Other

## 2011-05-27 ENCOUNTER — Encounter (HOSPITAL_COMMUNITY): Payer: Self-pay | Admitting: Cardiology

## 2011-05-27 ENCOUNTER — Inpatient Hospital Stay (HOSPITAL_COMMUNITY)
Admission: AD | Admit: 2011-05-27 | Discharge: 2011-05-28 | DRG: 310 | Disposition: A | Discharge status: Home / Self Care | Payer: Medicare Other | Source: Ambulatory Visit | Attending: Cardiovascular Disease | Admitting: Cardiovascular Disease

## 2011-05-27 DIAGNOSIS — Z7901 Long term (current) use of anticoagulants: Secondary | ICD-10-CM

## 2011-05-27 DIAGNOSIS — Z9849 Cataract extraction status, unspecified eye: Secondary | ICD-10-CM

## 2011-05-27 DIAGNOSIS — I495 Sick sinus syndrome: Secondary | ICD-10-CM | POA: Diagnosis present

## 2011-05-27 DIAGNOSIS — I4891 Unspecified atrial fibrillation: Principal | ICD-10-CM

## 2011-05-27 DIAGNOSIS — R000 Tachycardia, unspecified: Secondary | ICD-10-CM | POA: Diagnosis present

## 2011-05-27 DIAGNOSIS — F411 Generalized anxiety disorder: Secondary | ICD-10-CM | POA: Diagnosis present

## 2011-05-27 DIAGNOSIS — M1990 Unspecified osteoarthritis, unspecified site: Secondary | ICD-10-CM | POA: Diagnosis present

## 2011-05-27 DIAGNOSIS — R51 Headache: Secondary | ICD-10-CM | POA: Diagnosis present

## 2011-05-27 DIAGNOSIS — F419 Anxiety disorder, unspecified: Secondary | ICD-10-CM | POA: Diagnosis present

## 2011-05-27 DIAGNOSIS — H409 Unspecified glaucoma: Secondary | ICD-10-CM | POA: Diagnosis present

## 2011-05-27 DIAGNOSIS — Z79899 Other long term (current) drug therapy: Secondary | ICD-10-CM

## 2011-05-27 DIAGNOSIS — I480 Paroxysmal atrial fibrillation: Secondary | ICD-10-CM | POA: Diagnosis present

## 2011-05-27 DIAGNOSIS — Z950 Presence of cardiac pacemaker: Secondary | ICD-10-CM | POA: Diagnosis present

## 2011-05-27 DIAGNOSIS — I10 Essential (primary) hypertension: Secondary | ICD-10-CM | POA: Diagnosis present

## 2011-05-27 DIAGNOSIS — M129 Arthropathy, unspecified: Secondary | ICD-10-CM | POA: Diagnosis present

## 2011-05-27 DIAGNOSIS — E039 Hypothyroidism, unspecified: Secondary | ICD-10-CM | POA: Diagnosis present

## 2011-05-27 DIAGNOSIS — K219 Gastro-esophageal reflux disease without esophagitis: Secondary | ICD-10-CM | POA: Diagnosis present

## 2011-05-27 HISTORY — DX: Long term (current) use of anticoagulants: Z79.01

## 2011-05-27 HISTORY — DX: Other complications of anesthesia, initial encounter: T88.59XA

## 2011-05-27 HISTORY — DX: Hypothyroidism, unspecified: E03.9

## 2011-05-27 HISTORY — DX: Adverse effect of unspecified anesthetic, initial encounter: T41.45XA

## 2011-05-27 HISTORY — DX: Paroxysmal atrial fibrillation: I48.0

## 2011-05-27 HISTORY — DX: Sick sinus syndrome: I49.5

## 2011-05-27 HISTORY — DX: Gastro-esophageal reflux disease without esophagitis: K21.9

## 2011-05-27 HISTORY — DX: Unspecified osteoarthritis, unspecified site: M19.90

## 2011-05-27 HISTORY — DX: Essential (primary) hypertension: I10

## 2011-05-27 HISTORY — DX: Anxiety disorder, unspecified: F41.9

## 2011-05-27 LAB — CBC
HCT: 37 % (ref 36.0–46.0)
Hemoglobin: 12.5 g/dL (ref 12.0–15.0)
MCV: 94.9 fL (ref 78.0–100.0)
RBC: 3.9 MIL/uL (ref 3.87–5.11)
RDW: 12.6 % (ref 11.5–15.5)
WBC: 4.3 10*3/uL (ref 4.0–10.5)

## 2011-05-27 LAB — DIFFERENTIAL
Basophils Absolute: 0 10*3/uL (ref 0.0–0.1)
Eosinophils Relative: 4 % (ref 0–5)
Lymphocytes Relative: 39 % (ref 12–46)
Lymphs Abs: 1.7 10*3/uL (ref 0.7–4.0)
Monocytes Absolute: 0.6 10*3/uL (ref 0.1–1.0)
Monocytes Relative: 13 % — ABNORMAL HIGH (ref 3–12)
Neutro Abs: 1.9 10*3/uL (ref 1.7–7.7)

## 2011-05-27 LAB — URINALYSIS, ROUTINE W REFLEX MICROSCOPIC
Glucose, UA: NEGATIVE mg/dL
Hgb urine dipstick: NEGATIVE
Specific Gravity, Urine: 1.012 (ref 1.005–1.030)

## 2011-05-27 LAB — URINE MICROSCOPIC-ADD ON

## 2011-05-27 LAB — COMPREHENSIVE METABOLIC PANEL
AST: 22 U/L (ref 0–37)
CO2: 22 mEq/L (ref 19–32)
Chloride: 106 mEq/L (ref 96–112)
Creatinine, Ser: 0.69 mg/dL (ref 0.50–1.10)
GFR calc Af Amer: >90 mL/min (ref >90)
GFR calc non Af Amer: 88 mL/min — ABNORMAL LOW (ref >90)
Glucose, Bld: 84 mg/dL (ref 70–99)
Total Bilirubin: 0.3 mg/dL (ref 0.3–1.2)

## 2011-05-27 LAB — PROTIME-INR: INR: 1.42 (ref 0.00–1.49)

## 2011-05-27 LAB — CARDIAC PANEL(CRET KIN+CKTOT+MB+TROPI)
Relative Index: INVALID (ref 0.0–2.5)
Troponin I: <0.3 ng/mL (ref <0.30)

## 2011-05-27 MED ORDER — DABIGATRAN ETEXILATE MESYLATE 150 MG PO CAPS
150.0000 mg | ORAL_CAPSULE | Freq: Two times a day (BID) | ORAL | Status: DC
  Administered 2011-05-27 – 2011-05-28 (×2): 150 mg via ORAL
  Filled 2011-05-27 (×3): qty 1

## 2011-05-27 MED ORDER — POLYETHYLENE GLYCOL 3350 17 G PO PACK
17.0000 g | PACK | Freq: Every day | ORAL | Status: DC | PRN
  Filled 2011-05-27: qty 1

## 2011-05-27 MED ORDER — SODIUM CHLORIDE 0.9 % IV SOLN
INTRAVENOUS | Status: AC
  Administered 2011-05-27: 15:00:00 via INTRAVENOUS

## 2011-05-27 MED ORDER — LOSARTAN POTASSIUM 50 MG PO TABS
50.0000 mg | ORAL_TABLET | Freq: Every day | ORAL | Status: DC
  Filled 2011-05-27 (×2): qty 1

## 2011-05-27 MED ORDER — PANTOPRAZOLE SODIUM 40 MG PO TBEC
40.0000 mg | DELAYED_RELEASE_TABLET | Freq: Every day | ORAL | Status: DC
  Administered 2011-05-27 – 2011-05-28 (×2): 40 mg via ORAL
  Filled 2011-05-27 (×2): qty 1

## 2011-05-27 MED ORDER — ONDANSETRON HCL 4 MG/2ML IJ SOLN: 4.0000 mg | Freq: Four times a day (QID) | INTRAMUSCULAR | Status: DC | PRN

## 2011-05-27 MED ORDER — DILTIAZEM HCL 100 MG IV SOLR
5.0000 mg/h | INTRAVENOUS | Status: DC
  Administered 2011-05-27: 5 mg/h via INTRAVENOUS
  Administered 2011-05-28: 10 mg/h via INTRAVENOUS
  Filled 2011-05-27 (×3): qty 100

## 2011-05-27 MED ORDER — ESZOPICLONE 2 MG PO TABS
3.0000 mg | ORAL_TABLET | Freq: Every evening | ORAL | Status: DC | PRN
  Administered 2011-05-27: 3 mg via ORAL
  Filled 2011-05-27: qty 3

## 2011-05-27 MED ORDER — PROPAFENONE HCL ER 225 MG PO CP12
225.0000 mg | ORAL_CAPSULE | Freq: Two times a day (BID) | ORAL | Status: DC
  Administered 2011-05-27 – 2011-05-28 (×2): 225 mg via ORAL
  Filled 2011-05-27 (×4): qty 1

## 2011-05-27 MED ORDER — METOPROLOL SUCCINATE ER 50 MG PO TB24
50.0000 mg | ORAL_TABLET | Freq: Every day | ORAL | Status: DC
  Administered 2011-05-28: 50 mg via ORAL
  Filled 2011-05-27 (×2): qty 1

## 2011-05-27 MED ORDER — NON FORMULARY: 3.0000 mg | Freq: Every evening | Status: DC | PRN

## 2011-05-27 MED ORDER — ALUM & MAG HYDROXIDE-SIMETH 200-200-20 MG/5ML PO SUSP: 30.0000 mL | Freq: Four times a day (QID) | ORAL | Status: DC | PRN

## 2011-05-27 MED ORDER — DILTIAZEM LOAD VIA INFUSION
10.0000 mg | Freq: Once | INTRAVENOUS | Status: AC
  Administered 2011-05-27: 10 mg via INTRAVENOUS
  Filled 2011-05-27: qty 10

## 2011-05-27 MED ORDER — TRAZODONE HCL 50 MG PO TABS
50.0000 mg | ORAL_TABLET | Freq: Every evening | ORAL | Status: DC | PRN
  Filled 2011-05-27: qty 1

## 2011-05-27 MED ORDER — BUPROPION HCL ER (XL) 300 MG PO TB24
300.0000 mg | ORAL_TABLET | Freq: Every day | ORAL | Status: DC
  Administered 2011-05-28: 300 mg via ORAL
  Filled 2011-05-27 (×2): qty 1

## 2011-05-27 MED ORDER — THERA M PLUS PO TABS
1.0000 | ORAL_TABLET | Freq: Every day | ORAL | Status: DC
  Administered 2011-05-28: 1 via ORAL
  Filled 2011-05-27 (×2): qty 1

## 2011-05-27 MED ORDER — ALPRAZOLAM 0.25 MG PO TABS
0.2500 mg | ORAL_TABLET | Freq: Three times a day (TID) | ORAL | Status: DC | PRN
  Administered 2011-05-27 – 2011-05-28 (×2): 0.25 mg via ORAL
  Filled 2011-05-27 (×2): qty 1

## 2011-05-27 MED ORDER — BUTALBITAL-APAP-CAFFEINE 50-325-40 MG PO TABS
2.0000 | ORAL_TABLET | Freq: Four times a day (QID) | ORAL | Status: DC | PRN
  Administered 2011-05-27 – 2011-05-28 (×3): 2 via ORAL
  Filled 2011-05-27 (×3): qty 2

## 2011-05-27 MED ORDER — ESTROGENS CONJUGATED 0.3 MG PO TABS
0.3000 mg | ORAL_TABLET | Freq: Every day | ORAL | Status: DC
  Filled 2011-05-27 (×2): qty 1

## 2011-05-27 MED ORDER — SODIUM CHLORIDE 0.9 % IV SOLN
INTRAVENOUS | Status: DC
  Administered 2011-05-27: 12:00:00 via INTRAVENOUS

## 2011-05-27 MED ORDER — ONDANSETRON HCL 4 MG PO TABS: 4.0000 mg | ORAL_TABLET | Freq: Four times a day (QID) | ORAL | Status: DC | PRN

## 2011-05-27 NOTE — H&P (Signed)
65 yoWF, admitted with recurrent Pafib with RVR.  Rythmol d/c'd last week at her request.  Today she woke in A fib, did take a ryhmol and an extra half of toprol.  Was then seen by Dr. Alanda Amass and admitted to hospital.  Currently stable with RVR and a headache and anxiety.  She prefers a. Fib ablation. Will have EP see to evaluate.  Perm. Pacer placed in October of this year, for SSS.  She is on Pradaxa for anticoagulation.  See Dr. Kandis Cocking H&P for full details.  No history of CAD, neg. nuc study July of 2008.  2D ECHO   10/2008 WITH EF 55% Agree with note written by Nada Boozer RNP Runell Gess 05/27/2011 2:36 PM

## 2011-05-27 NOTE — Consult Note (Addendum)
ELECTROPHYSIOLOGY CONSULT NOTE  Primary Care Physician: Thayer Headings, MD, MD Referring Physician:  Dr Rubie Maid  Admit Date: 05/27/2011  Reason for consultation:  Atrial fibrillation  Jacqueline Kim is a 65 y.o. female with a h/o paroxysmal atrial fibrillation and tachycardia bradycardia syndrome who was admitted with recurrent afib.  She reports initially being diagnosed with atrial fibrillation 12/2006.  She was placed on Rhythmol by Dr [name] and did well for some time.  She developed recurrent afib 10/12 for which she was admitted to [facility].  She was observed to have a prolonged post conversion pause.  She subsequently developed worsening bradycardia and therefore had a PPM implanted by Dr Rubie Maid 04/08/11.  She did very well s/p PPM.  She presented to Dr Croitoru's office 05/22/11 doing well.  At that time, she decides to stop rhythmol.  She denies any symptoms with this medications but was concerned about long term side effects.  She took her last dose Friday evening and reports that she returned to afib last night.  She reports symptoms of palpitations and fatigue.  She restarted her Rhythmol this morning but did not convert to sinus rhythm.  She is therefore admitted to [facility] for further management.  Today, she denies symptoms of  chest pain, shortness of breath, orthopnea, PND, lower extremity edema, dizziness, presyncope, syncope, or neurologic sequela. The patient is tolerating medications without difficulties and is otherwise without complaint today.   Past Medical History  Diagnosis Date  . Paroxysmal a-fib diagnosed 2008  . 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 Hypothyroid 05/27/2011  . Pacemaker Medtronic  (REVO) placed10/23/12 04/08/11  . Anxiety   . Hypertension   . GERD (gastroesophageal reflux disease)   . Glaucoma   . Complication of anesthesia     DIFFICULTY WAKING UP  . Shortness of breath     AF LAST 3 EPI1SODES SINCE AUG    . Arthritis   . Headache     OCCASIONALLY  . Anticoagulant long-term use  WITH PRADAXA 05/27/2011  . DJD (degenerative joint disease)    Past Surgical History  Procedure Date  . Pacemaker insertion 04/08/11    MDT Revo implanted by Dr Rubie Maid  . Tonsillectomy   . Eye surgery     CATERACTS  . Abdominal hysterectomy        . buPROPion  300 mg Oral Daily  . dabigatran  150 mg Oral Q12H  . diltiazem  10 mg Intravenous Once  . estrogens (conjugated)  0.3 mg Oral Daily  . losartan  50 mg Oral Daily  . metoprolol succinate  50 mg Oral Daily  . multivitamins ther. w/minerals  1 tablet Oral Daily  . pantoprazole  40 mg Oral Q1200      . sodium chloride 125 mL/hr at 05/27/11 1434  . diltiazem (CARDIZEM) infusion 10 mg/hr (05/27/11 1538)  . DISCONTD: sodium chloride 50 mL/hr at 05/27/11 1215    Allergies  Allergen Reactions  . Penicillins Anaphylaxis    History   Social History  . Marital Status: Married    Spouse Name: N/A    Number of Children: N/A  . Years of Education: N/A   Occupational History  . Not on file.   Social History Main Topics  . Smoking status: Never Smoker   . Smokeless tobacco: Never Used  . Alcohol Use: 6.0 oz/week    10 Glasses of wine per week  . Drug Use: No  . Sexually Active:  Yes   Other Topics Concern  . Not on file   Social History Narrative   Lives in [place],  Former [employer],  Married    Family History  Problem Relation Age of Onset  . Anesthesia problems Mother     ROS- All systems are reviewed and negative except as per the HPI above  Physical Exam: Telemetry: Filed Vitals:   05/27/11 1100 05/27/11 1329 05/27/11 1423 05/27/11 1535  BP: 133/94 121/80 96/62 118/75  Pulse: 120 112 122 121  Temp: 97.4 F (36.3 C) 98.3 F (36.8 C)    TempSrc: Oral Oral    Resp: 20 18    Height:  5\' 5"  (1.651 m)    Weight:  147 lb (66.679 kg)    SpO2: 99% 94% 95%     GEN- The patient is well appearing, alert and  oriented x 3 today.   Head- normocephalic, atraumatic Eyes-  Sclera clear, conjunctiva pink Ears- hearing intact Oropharynx- clear Neck- supple, no JVP Lymph- no cervical lymphadenopathy Lungs- Clear to ausculation bilaterally, normal work of breathing Heart- irregular rate and rhythm, no murmurs, rubs or gallops, PMI not laterally displaced GI- soft, NT, ND, + BS Extremities- no clubbing, cyanosis, or edema Jacqueline- no significant deformity or atrophy Skin- no rash or lesion Psych- euthymic mood, full affect Neuro- strength and sensation are intact  Echo 11/07/08 reveals normal LV size and function, normal LA size, no significant valvular disease  ekg from today reveals afib, V rate 120 bpm, QRS 78 Jacqueline, QTc480,   Labs:   Lab Results  Component Value Date   WBC 4.3 05/27/2011   HGB 12.5 05/27/2011   HCT 37.0 05/27/2011   MCV 94.9 05/27/2011   PLT 178 05/27/2011    Lab 05/27/11 1130  NA 139  K 3.7  CL 106  CO2 22  BUN 15  CREATININE 0.69  CALCIUM 9.5  PROT 6.2  BILITOT 0.3  ALKPHOS 28*  ALT 14  AST 22  GLUCOSE 84   Lab Results  Component Value Date   CKTOTAL 80 05/27/2011   CKMB 3.5 05/27/2011   TROPONINI <0.30 05/27/2011    Lab Results  Component Value Date   CHOL  Value: 172        ATP III CLASSIFICATION:  <200     mg/dL   Desirable  098-119  mg/dL   Borderline High  >=147    mg/dL   High 01/15/9561   Lab Results  Component Value Date   HDL 37* 12/19/2006   Lab Results  Component Value Date   LDLCALC  Value: 76        Total Cholesterol/HDL:CHD Risk Coronary Heart Disease Risk Table                     Men   Women  1/2 Average Risk   3.4   3.3 12/19/2006   Lab Results  Component Value Date   TRIG 293* 12/19/2006   Lab Results  Component Value Date   CHOLHDL 4.6 12/19/2006   No results found for this basename: LDLDIRECT    ASSESSMENT AND PLAN:   Jacqueline Kim is a pleasant 65 yo WF with a h/o HTN, paroxysmal atrial fibrillation, and tachycardia/ bradycardia  syndrome s/p PPM who is now admitted with symptomatic afib.  She recently stopped rhythmol due to concerns about taking this medication long term.  She admits to tolerating the medicine well.  She is complaint with pradaxa.  Therapeutic  strategies for afib including medicine and ablation were discussed in detail with the patient today. We discussed different AAD options including flecainide, tikosyn, multaq and sotalol.  Risk, benefits, and alternatives to EP study and radiofrequency ablation for afib were also discussed in detail today.  At this time, she is very clear that she wishes to restart rhythmol.  She would like to further contemplate afib ablation through the holidays and then follow-up with me in the office for further discussion thereafter.  She is reluctant to consider the procedure at this time.  Recs: 1. Resume rhythmol 150mg  TID 2. Resume pradaxa 150mg  BID 3. Continue IV cardizem overnight 4. Repeat cardioversion tomorrow if she remains in afib 5. Follow-up with me in the office (as per her request in several months to further consider ablation)  I will see as needed while here.  Please call with questions.  Thank you. Hillis Range, MD 05/27/2011 4:52 PM   Pt wishes to try twice daily rhythmol (admits to frequently missing the third daily dose). Will therefore start rhythmol 225mg  BID.    Hillis Range, MD 05/27/2011  4:41 PM

## 2011-05-27 NOTE — Progress Notes (Signed)
Patient admitted from MD office with atrial fibrillation with rapid ventricular response rates 130-150's.  Nada Boozer NP paged an made aware of patient's arrival.  Initiated on IV cardizem drip at 5 mg/hr after 10 mg bolus.  Heart rate continued in 120-130's, SBP down to 90's.  Nada Boozer NP notified and orders received for 250 NS bolus.  Bolus given over 2 hours, SBP up to 110's and Cardizem drip increased to 10 mg/hr.  Heart rate continued in 100-120's.  Dr. Johney Frame up to see patient and ordered Rhythmol.  Rhythmol given, patient heart rate in 80-90's atrial fib.  Will continue to monitor.  Colman Cater

## 2011-05-28 ENCOUNTER — Other Ambulatory Visit: Payer: Self-pay

## 2011-05-28 LAB — BASIC METABOLIC PANEL
CO2: 24 mEq/L (ref 19–32)
Glucose, Bld: 104 mg/dL — ABNORMAL HIGH (ref 70–99)
Potassium: 3.7 mEq/L (ref 3.5–5.1)
Sodium: 141 mEq/L (ref 135–145)

## 2011-05-28 LAB — CBC
Hemoglobin: 12.3 g/dL (ref 12.0–15.0)
MCH: 31.9 pg (ref 26.0–34.0)
MCV: 95.3 fL (ref 78.0–100.0)
RBC: 3.86 MIL/uL — ABNORMAL LOW (ref 3.87–5.11)

## 2011-05-28 MED ORDER — DILTIAZEM HCL 100 MG IV SOLR
5.0000 mg/h | INTRAVENOUS | Status: AC
  Filled 2011-05-28: qty 100

## 2011-05-28 MED ORDER — METOPROLOL SUCCINATE ER 50 MG PO TB24: 50.0000 mg | ORAL_TABLET | Freq: Every day | ORAL | Status: DC

## 2011-05-28 MED ORDER — ESTROGENS CONJUGATED 0.3 MG PO TABS
0.3000 mg | ORAL_TABLET | Freq: Every day | ORAL | Status: DC
  Administered 2011-05-28: 0.3 mg via ORAL
  Filled 2011-05-28: qty 1

## 2011-05-28 MED ORDER — DILTIAZEM HCL ER COATED BEADS 240 MG PO CP24: 240.0000 mg | ORAL_CAPSULE | Freq: Every day | ORAL | Status: DC

## 2011-05-28 MED ORDER — OXYCODONE-ACETAMINOPHEN 5-325 MG PO TABS: 1.0000 | ORAL_TABLET | ORAL | Status: DC | PRN

## 2011-05-28 MED ORDER — DILTIAZEM HCL ER COATED BEADS 240 MG PO CP24
240.0000 mg | ORAL_CAPSULE | Freq: Every day | ORAL | Status: DC
  Administered 2011-05-28: 240 mg via ORAL
  Filled 2011-05-28 (×2): qty 1

## 2011-05-28 MED ORDER — PROPAFENONE HCL ER 225 MG PO CP12: 225.0000 mg | ORAL_CAPSULE | Freq: Two times a day (BID) | ORAL | Status: DC

## 2011-05-28 NOTE — Progress Notes (Signed)
Pt. Seen and examined. Agree with the NP/PA-C note as written. She declines cardioversion. Rate controlled on cardizem. On pradaxa and rhythmol.  Change to po cardizem today.  Follow-up in office and with Dr. Johney Frame.  Chrystie Nose, MD Attending Cardiologist [facility]

## 2011-05-28 NOTE — Discharge Summary (Signed)
Patient ID: Jacqueline Kim,  MRN: [id number], DOB/AGE: May 27, 1946 65 y.o.  Admit date: 05/27/2011 Discharge date: 05/28/2011  Primary Care Provider: Dr Gillermina Phy Primary Cardiologist: Dr [name]  Discharge Diagnoses Active Problems:  Paroxysmal a-fib  Sinus node dysfunction  Tachycardia  Hypothyroid  Pacemaker Medtronic  (REVO) placed10/23/12  Anxiety  Anticoagulant long-term use  WITH PRADAXA    Procedures: None  History of Present Illness:  78 yoWF, admitted with recurrent Pafib with RVR. Rythmol d/c'd last week at her request. Today she woke in A fib, did take a ryhmol and an extra half of toprol. Was then seen by Dr. Alanda Amass and admitted to hospital. Currently stable with RVR and a headache and anxiety. She prefers a. Fib ablation. Will have EP see to evaluate.     Hospital Course :  Ms. Jacqueline Kim is a 65 year old female followed by Dr. [name]. She has no history of coronary disease. She had a low risk Myoview 2007. She has good LV function. She does have atrial fibrillation. She had a Medtronic pacemaker implanted 04/08/2011 because she was having pauses going in and out of fibrillation. She was put on Rythmol to help keep her in sinus rhythm. She had seen Dr. Royann Shivers as an outpatient and requested that the Rythmol be discontinued, she does not like to take medications. He explained that there was a risk of recurrent atrial fibrillation and she accepted that. She showed up in the office on 05/27/2011 in atrial fibrillation. She is protected by Pradaxa. She was admitted to telemetry and started on IV diltiazem for rate control. We asked Dr. Hillis Range to see her in consult as the patient was interested in radiofrequency ablation of her atrial fibrillation. Dr. Johney Frame did see her in consult, at this time the patient will contact Dr. Johney Frame after the holidays if she wished wishes to pursue this. Her rate did come under good control with diltiazem. The Rythmol has been resumed. We feel  she can be discharged later today. She did not want a cardioversion prior to discharge.  Discharge Vitals:  Blood pressure 106/64, pulse 70, temperature 97.5 F (36.4 C), temperature source Oral, resp. rate 18, height 5\' 5"  (1.651 m), weight 66.679 kg (147 lb), SpO2 97.00%.    Labs: Results for orders placed during the hospital encounter of 05/27/11 (from the past 48 hour(s))  CBC     Status: Normal   Collection Time   05/27/11 11:30 AM      Component Value Range Comment   WBC 4.3  4.0 - 10.5 (K/uL)    RBC 3.90  3.87 - 5.11 (MIL/uL)    Hemoglobin 12.5  12.0 - 15.0 (g/dL)    HCT 13.0  86.5 - 78.4 (%)    MCV 94.9  78.0 - 100.0 (fL)    MCH 32.1  26.0 - 34.0 (pg)    MCHC 33.8  30.0 - 36.0 (g/dL)    RDW 69.6  29.5 - 28.4 (%)    Platelets 178  150 - 400 (K/uL)   DIFFERENTIAL     Status: Abnormal   Collection Time   05/27/11 11:30 AM      Component Value Range Comment   Neutrophils Relative 44  43 - 77 (%)    Neutro Abs 1.9  1.7 - 7.7 (K/uL)    Lymphocytes Relative 39  12 - 46 (%)    Lymphs Abs 1.7  0.7 - 4.0 (K/uL)    Monocytes Relative 13 (*) 3 - 12 (%)  Monocytes Absolute 0.6  0.1 - 1.0 (K/uL)    Eosinophils Relative 4  0 - 5 (%)    Eosinophils Absolute 0.2  0.0 - 0.7 (K/uL)    Basophils Relative 1  0 - 1 (%)    Basophils Absolute 0.0  0.0 - 0.1 (K/uL)   COMPREHENSIVE METABOLIC PANEL     Status: Abnormal   Collection Time   05/27/11 11:30 AM      Component Value Range Comment   Sodium 139  135 - 145 (mEq/L)    Potassium 3.7  3.5 - 5.1 (mEq/L)    Chloride 106  96 - 112 (mEq/L)    CO2 22  19 - 32 (mEq/L)    Glucose, Bld 84  70 - 99 (mg/dL)    BUN 15  6 - 23 (mg/dL)    Creatinine, Ser 1.61  0.50 - 1.10 (mg/dL)    Calcium 9.5  8.4 - 10.5 (mg/dL)    Total Protein 6.2  6.0 - 8.3 (g/dL)    Albumin 3.7  3.5 - 5.2 (g/dL)    AST 22  0 - 37 (U/L)    ALT 14  0 - 35 (U/L)    Alkaline Phosphatase 28 (*) 39 - 117 (U/L)    Total Bilirubin 0.3  0.3 - 1.2 (mg/dL)    GFR calc non Af  Amer 88 (*) >90 (mL/min)    GFR calc Af Amer >90  >90 (mL/min)   PROTIME-INR     Status: Abnormal   Collection Time   05/27/11 11:30 AM      Component Value Range Comment   Prothrombin Time 17.6 (*) 11.6 - 15.2 (seconds)    INR 1.42  0.00 - 1.49    APTT     Status: Abnormal   Collection Time   05/27/11 11:30 AM      Component Value Range Comment   aPTT 49 (*) 24 - 37 (seconds)   CARDIAC PANEL(CRET KIN+CKTOT+MB+TROPI)     Status: Normal   Collection Time   05/27/11 11:30 AM      Component Value Range Comment   Total CK 80  7 - 177 (U/L)    CK, MB 3.5  0.3 - 4.0 (ng/mL)    Troponin I <0.30  <0.30 (ng/mL)    Relative Index RELATIVE INDEX IS INVALID  0.0 - 2.5    TSH     Status: Normal   Collection Time   05/27/11 11:30 AM      Component Value Range Comment   TSH 3.203  0.350 - 4.500 (uIU/mL)   URINALYSIS, ROUTINE W REFLEX MICROSCOPIC     Status: Abnormal   Collection Time   05/27/11  5:12 PM      Component Value Range Comment   Color, Urine YELLOW  YELLOW     APPearance HAZY (*) CLEAR     Specific Gravity, Urine 1.012  1.005 - 1.030     pH 6.0  5.0 - 8.0     Glucose, UA NEGATIVE  NEGATIVE (mg/dL)    Hgb urine dipstick NEGATIVE  NEGATIVE     Bilirubin Urine NEGATIVE  NEGATIVE     Ketones, ur NEGATIVE  NEGATIVE (mg/dL)    Protein, ur NEGATIVE  NEGATIVE (mg/dL)    Urobilinogen, UA 0.2  0.0 - 1.0 (mg/dL)    Nitrite NEGATIVE  NEGATIVE     Leukocytes, UA MODERATE (*) NEGATIVE    URINE MICROSCOPIC-ADD ON     Status: Abnormal   Collection Time  05/27/11  5:12 PM      Component Value Range Comment   Squamous Epithelial / LPF FEW (*) RARE     WBC, UA 21-50  <3 (WBC/hpf)    Bacteria, UA FEW (*) RARE     Casts HYALINE CASTS (*) NEGATIVE    BASIC METABOLIC PANEL     Status: Abnormal   Collection Time   05/28/11  6:21 AM      Component Value Range Comment   Sodium 141  135 - 145 (mEq/L)    Potassium 3.7  3.5 - 5.1 (mEq/L)    Chloride 108  96 - 112 (mEq/L)    CO2 24  19 - 32  (mEq/L)    Glucose, Bld 104 (*) 70 - 99 (mg/dL)    BUN 12  6 - 23 (mg/dL)    Creatinine, Ser 6.96  0.50 - 1.10 (mg/dL)    Calcium 8.7  8.4 - 10.5 (mg/dL)    GFR calc non Af Amer 88 (*) >90 (mL/min)    GFR calc Af Amer >90  >90 (mL/min)   CBC     Status: Abnormal   Collection Time   05/28/11  6:21 AM      Component Value Range Comment   WBC 3.9 (*) 4.0 - 10.5 (K/uL)    RBC 3.86 (*) 3.87 - 5.11 (MIL/uL)    Hemoglobin 12.3  12.0 - 15.0 (g/dL)    HCT 29.5  28.4 - 13.2 (%)    MCV 95.3  78.0 - 100.0 (fL)    MCH 31.9  26.0 - 34.0 (pg)    MCHC 33.4  30.0 - 36.0 (g/dL)    RDW 44.0  10.2 - 72.5 (%)    Platelets 188  150 - 400 (K/uL)     Disposition:    Discharge Medications:  Current Discharge Medication List    START taking these medications   Details  diltiazem (CARDIZEM CD) 240 MG 24 hr capsule Take 1 capsule (240 mg total) by mouth daily. Qty: 30 capsule, Refills: 5    propafenone (RYTHMOL SR) 225 MG 12 hr capsule Take 1 capsule (225 mg total) by mouth every 12 (twelve) hours. Qty: 60 capsule, Refills: 5      CONTINUE these medications which have CHANGED   Details  metoprolol (TOPROL-XL) 50 MG 24 hr tablet Take 1 tablet (50 mg total) by mouth daily. Qty: 30 tablet, Refills: 5      CONTINUE these medications which have NOT CHANGED   Details  Biotin 10 MG TABS Take 1 tablet by mouth at bedtime.      buPROPion (WELLBUTRIN XL) 300 MG 24 hr tablet Take 300 mg by mouth daily.      celecoxib (CELEBREX) 200 MG capsule Take 200 mg by mouth daily.      conjugated estrogens (PREMARIN) vaginal cream Place 1.5 g vaginally 3 (three) times a week.      dabigatran (PRADAXA) 150 MG CAPS Take 150 mg by mouth every 12 (twelve) hours.      estrogens, conjugated, (PREMARIN) 0.3 MG tablet Take 0.3 mg by mouth daily. Take daily for 21 days then do not take for 7 days.     hydrocodone-acetaminophen (LORCET-HD) 5-500 MG per capsule Take 1 capsule by mouth every 6 (six) hours as needed. For  pain     levothyroxine (SYNTHROID, LEVOTHROID) 100 MCG tablet Take 100 mcg by mouth daily.      Multiple Vitamins-Minerals (MULTIVITAMINS THER. W/MINERALS) TABS Take 1 tablet by mouth at  bedtime.      traZODone (DESYREL) 150 MG tablet Take 150 mg by mouth at bedtime.      zaleplon (SONATA) 10 MG capsule Take 10 mg by mouth at bedtime.        STOP taking these medications     losartan (COZAAR) 100 MG tablet      propafenone (RYTHMOL) 150 MG tablet         Outstanding Labs/Studies  Duration of Discharge Encounter: Greater than 30 minutes including physician time.  Jolene Provost PA-C 05/28/2011 11:30 AM

## 2011-05-28 NOTE — Discharge Summary (Signed)
Kenneth C. Hilty, MD Attending Cardiologist [facility]  

## 2011-05-28 NOTE — Progress Notes (Signed)
Subjective:  No complaints  Objective:  Vital Signs in the last 24 hours: Temp:  [97.4 F (36.3 C)-98.5 F (36.9 C)] 97.4 F (36.3 C) (12/12 0500) Pulse Rate:  [61-122] 61  (12/12 0500) Resp:  [16-20] 18  (12/12 0500) BP: (95-133)/(62-94) 118/81 mmHg (12/12 0500) SpO2:  [94 %-99 %] 96 % (12/12 0500) Weight:  [66.679 kg (147 lb)] 147 lb (66.679 kg) (12/11 1329)  Intake/Output from previous day:  Intake/Output Summary (Last 24 hours) at 05/28/11 0858 Last data filed at 05/27/11 2005  Gross per 24 hour  Intake 841.67 ml  Output      0 ml  Net 841.67 ml    Physical Exam: General appearance: alert, cooperative and no distress Lungs: clear to auscultation bilaterally Heart: irregularly irregular rhythm   Rate: 78  Rhythm: atrial fibrillation  Lab Results:  Basename 05/28/11 0621 05/27/11 1130  WBC 3.9* 4.3  HGB 12.3 12.5  PLT 188 178    Basename 05/28/11 0621 05/27/11 1130  NA 141 139  K 3.7 3.7  CL 108 106  CO2 24 22  GLUCOSE 104* 84  BUN 12 15  CREATININE 0.70 0.69    Basename 05/27/11 1130  TROPONINI <0.30   Hepatic Function Panel  Basename 05/27/11 1130  PROT 6.2  ALBUMIN 3.7  AST 22  ALT 14  ALKPHOS 28*  BILITOT 0.3  BILIDIR --  IBILI --   No results found for this basename: CHOL in the last 72 hours  Basename 05/27/11 1130  INR 1.42    Imaging: Portable Chest 1 View  05/27/2011  *RADIOLOGY REPORT*  Clinical Data: Atrial fibrillation  PORTABLE CHEST - 1 VIEW  Comparison: 04/09/2011  Findings: Dual lead left subclavian pacemaker appears stable in the frontal projection. Cardiomediastinal silhouette within normal limits for portable technique.  Normal pulmonary vascularity.  The lungs are clear. No visible pleural effusion or pneumothorax.  No acute osseous abnormality  IMPRESSION: . 1.  No acute cardiopulmonary disease. 2.  Dual lead pacemaker.  Original Report Authenticated By: Britta Mccreedy, M.D.    Cardiac Studies:  Assessment/Plan:    Active Problems:  Paroxysmal a-fib  Sinus node dysfunction  Tachycardia  Hypothyroid  Pacemaker Medtronic  (REVO) placed10/23/12  Anxiety  Anticoagulant long-term use  WITH PRADAXA    Plan- Discussed with Dr Tillie Rung. Pt does not want CV today. She wants to go home on Rythmol. She will f/u with Dr Tillie Rung after the [facility] if she wants to pursue RFA. Change IV Dilt to PO. D/C later today. Will stop Cozaar, start PO Diltiazem, (b/p a little soft).  Corine Shelter PA-C 05/28/2011, 8:58 AM

## 2011-05-28 NOTE — Progress Notes (Signed)
Reviewed discharge instructions with patient, she stated her understanding.  Discussed atrial fibrillation and importance of medication, including rythmol.  Patient did not want to another off the beat booklet.  IV discontinued, cath intact.  Patient discharged via wheelchair to car.  Colman Cater

## 2011-09-09 ENCOUNTER — Encounter (HOSPITAL_COMMUNITY): Payer: Self-pay | Admitting: *Deleted

## 2011-09-09 ENCOUNTER — Other Ambulatory Visit: Payer: Self-pay

## 2011-09-09 ENCOUNTER — Inpatient Hospital Stay (HOSPITAL_COMMUNITY)
Admission: EM | Admit: 2011-09-09 | Discharge: 2011-09-11 | DRG: 310 | Disposition: A | Discharge status: Home / Self Care | Payer: Medicare Other | Attending: Cardiovascular Disease | Admitting: Cardiovascular Disease

## 2011-09-09 ENCOUNTER — Emergency Department (HOSPITAL_COMMUNITY): Payer: Medicare Other

## 2011-09-09 DIAGNOSIS — E876 Hypokalemia: Secondary | ICD-10-CM | POA: Diagnosis present

## 2011-09-09 DIAGNOSIS — R7989 Other specified abnormal findings of blood chemistry: Secondary | ICD-10-CM | POA: Diagnosis present

## 2011-09-09 DIAGNOSIS — Z880 Allergy status to penicillin: Secondary | ICD-10-CM

## 2011-09-09 DIAGNOSIS — K219 Gastro-esophageal reflux disease without esophagitis: Secondary | ICD-10-CM | POA: Diagnosis present

## 2011-09-09 DIAGNOSIS — I4891 Unspecified atrial fibrillation: Principal | ICD-10-CM | POA: Diagnosis present

## 2011-09-09 DIAGNOSIS — F411 Generalized anxiety disorder: Secondary | ICD-10-CM | POA: Diagnosis present

## 2011-09-09 DIAGNOSIS — S8010XA Contusion of unspecified lower leg, initial encounter: Secondary | ICD-10-CM | POA: Diagnosis present

## 2011-09-09 DIAGNOSIS — R079 Chest pain, unspecified: Secondary | ICD-10-CM | POA: Diagnosis present

## 2011-09-09 DIAGNOSIS — I059 Rheumatic mitral valve disease, unspecified: Secondary | ICD-10-CM | POA: Diagnosis present

## 2011-09-09 DIAGNOSIS — E039 Hypothyroidism, unspecified: Secondary | ICD-10-CM | POA: Diagnosis present

## 2011-09-09 DIAGNOSIS — Z87892 Personal history of anaphylaxis: Secondary | ICD-10-CM

## 2011-09-09 DIAGNOSIS — Z950 Presence of cardiac pacemaker: Secondary | ICD-10-CM | POA: Diagnosis present

## 2011-09-09 DIAGNOSIS — T46905A Adverse effect of unspecified agents primarily affecting the cardiovascular system, initial encounter: Secondary | ICD-10-CM | POA: Diagnosis present

## 2011-09-09 DIAGNOSIS — M1990 Unspecified osteoarthritis, unspecified site: Secondary | ICD-10-CM | POA: Diagnosis present

## 2011-09-09 DIAGNOSIS — Z9849 Cataract extraction status, unspecified eye: Secondary | ICD-10-CM

## 2011-09-09 DIAGNOSIS — Z79899 Other long term (current) drug therapy: Secondary | ICD-10-CM

## 2011-09-09 DIAGNOSIS — I959 Hypotension, unspecified: Secondary | ICD-10-CM | POA: Diagnosis present

## 2011-09-09 DIAGNOSIS — I079 Rheumatic tricuspid valve disease, unspecified: Secondary | ICD-10-CM | POA: Diagnosis present

## 2011-09-09 DIAGNOSIS — I495 Sick sinus syndrome: Secondary | ICD-10-CM | POA: Diagnosis present

## 2011-09-09 DIAGNOSIS — X58XXXA Exposure to other specified factors, initial encounter: Secondary | ICD-10-CM | POA: Diagnosis present

## 2011-09-09 DIAGNOSIS — Z7901 Long term (current) use of anticoagulants: Secondary | ICD-10-CM

## 2011-09-09 DIAGNOSIS — IMO0002 Reserved for concepts with insufficient information to code with codable children: Secondary | ICD-10-CM

## 2011-09-09 DIAGNOSIS — I10 Essential (primary) hypertension: Secondary | ICD-10-CM | POA: Diagnosis present

## 2011-09-09 DIAGNOSIS — R0789 Other chest pain: Secondary | ICD-10-CM | POA: Diagnosis present

## 2011-09-09 LAB — BASIC METABOLIC PANEL
BUN: 22 mg/dL (ref 6–23)
Creatinine, Ser: 0.73 mg/dL (ref 0.50–1.10)
GFR calc Af Amer: >90 mL/min (ref >90)
GFR calc non Af Amer: 86 mL/min — ABNORMAL LOW (ref >90)

## 2011-09-09 LAB — CBC
HCT: 37.7 % (ref 36.0–46.0)
Hemoglobin: 12.7 g/dL (ref 12.0–15.0)
MCH: 31.8 pg (ref 26.0–34.0)
MCHC: 33.7 g/dL (ref 30.0–36.0)
RDW: 12.8 % (ref 11.5–15.5)

## 2011-09-09 LAB — CARDIAC PANEL(CRET KIN+CKTOT+MB+TROPI)
Relative Index: INVALID (ref 0.0–2.5)
Total CK: 55 U/L (ref 7–177)

## 2011-09-09 LAB — D-DIMER, QUANTITATIVE: D-Dimer, Quant: 0.71 ug/mL-FEU — ABNORMAL HIGH (ref 0.00–0.48)

## 2011-09-09 LAB — MAGNESIUM: Magnesium: 1.6 mg/dL (ref 1.5–2.5)

## 2011-09-09 LAB — PROTIME-INR: Prothrombin Time: 17.3 seconds — ABNORMAL HIGH (ref 11.6–15.2)

## 2011-09-09 LAB — DIFFERENTIAL
Basophils Absolute: 0 10*3/uL (ref 0.0–0.1)
Basophils Relative: 1 % (ref 0–1)
Eosinophils Absolute: 0.3 10*3/uL (ref 0.0–0.7)
Monocytes Absolute: 0.5 10*3/uL (ref 0.1–1.0)
Monocytes Relative: 10 % (ref 3–12)
Neutrophils Relative %: 51 % (ref 43–77)

## 2011-09-09 MED ORDER — ADULT MULTIVITAMIN W/MINERALS CH
1.0000 | ORAL_TABLET | Freq: Every day | ORAL | Status: DC
  Administered 2011-09-10 – 2011-09-11 (×2): 1 via ORAL
  Filled 2011-09-09 (×2): qty 1

## 2011-09-09 MED ORDER — LEVOTHYROXINE SODIUM 100 MCG PO TABS
100.0000 ug | ORAL_TABLET | Freq: Every day | ORAL | Status: DC
  Administered 2011-09-10 – 2011-09-11 (×2): 100 ug via ORAL
  Filled 2011-09-09 (×2): qty 1

## 2011-09-09 MED ORDER — ONDANSETRON HCL 4 MG PO TABS: 4.0000 mg | ORAL_TABLET | Freq: Four times a day (QID) | ORAL | Status: DC | PRN

## 2011-09-09 MED ORDER — BIOTIN 10 MG PO TABS: 1.0000 | ORAL_TABLET | Freq: Every day | ORAL | Status: DC

## 2011-09-09 MED ORDER — THERA M PLUS PO TABS: 1.0000 | ORAL_TABLET | Freq: Every day | ORAL | Status: DC

## 2011-09-09 MED ORDER — PROPAFENONE HCL ER 225 MG PO CP12
225.0000 mg | ORAL_CAPSULE | Freq: Two times a day (BID) | ORAL | Status: DC
  Administered 2011-09-09 – 2011-09-10 (×2): 225 mg via ORAL
  Filled 2011-09-09 (×4): qty 1

## 2011-09-09 MED ORDER — DABIGATRAN ETEXILATE MESYLATE 150 MG PO CAPS
150.0000 mg | ORAL_CAPSULE | Freq: Two times a day (BID) | ORAL | Status: DC
  Administered 2011-09-09 – 2011-09-11 (×4): 150 mg via ORAL
  Filled 2011-09-09 (×5): qty 1

## 2011-09-09 MED ORDER — SODIUM CHLORIDE 0.9 % IJ SOLN
3.0000 mL | Freq: Two times a day (BID) | INTRAMUSCULAR | Status: DC
  Administered 2011-09-10 – 2011-09-11 (×3): 3 mL via INTRAVENOUS

## 2011-09-09 MED ORDER — BUPROPION HCL ER (XL) 300 MG PO TB24
300.0000 mg | ORAL_TABLET | Freq: Every day | ORAL | Status: DC
  Administered 2011-09-10 – 2011-09-11 (×2): 300 mg via ORAL
  Filled 2011-09-09 (×5): qty 1

## 2011-09-09 MED ORDER — ACETAMINOPHEN 325 MG PO TABS
650.0000 mg | ORAL_TABLET | Freq: Once | ORAL | Status: AC
  Administered 2011-09-09: 650 mg via ORAL

## 2011-09-09 MED ORDER — SODIUM CHLORIDE 0.9 % IV BOLUS (SEPSIS)
500.0000 mL | Freq: Once | INTRAVENOUS | Status: AC
  Administered 2011-09-09: 500 mL via INTRAVENOUS

## 2011-09-09 MED ORDER — DILTIAZEM HCL 100 MG IV SOLR
5.0000 mg/h | INTRAVENOUS | Status: DC
  Administered 2011-09-09 – 2011-09-10 (×2): 5 mg/h via INTRAVENOUS
  Filled 2011-09-09: qty 100

## 2011-09-09 MED ORDER — ACETAMINOPHEN 325 MG PO TABS
ORAL_TABLET | ORAL | Status: AC
  Administered 2011-09-09: 650 mg via ORAL
  Filled 2011-09-09: qty 2

## 2011-09-09 MED ORDER — ONDANSETRON HCL 4 MG/2ML IJ SOLN: 4.0000 mg | Freq: Four times a day (QID) | INTRAMUSCULAR | Status: DC | PRN

## 2011-09-09 MED ORDER — ACETAMINOPHEN 325 MG PO TABS: 650.0000 mg | ORAL_TABLET | Freq: Four times a day (QID) | ORAL | Status: DC | PRN

## 2011-09-09 MED ORDER — HYDROCODONE-ACETAMINOPHEN 5-325 MG PO TABS
1.0000 | ORAL_TABLET | Freq: Four times a day (QID) | ORAL | Status: DC | PRN
  Administered 2011-09-09 – 2011-09-10 (×3): 1 via ORAL
  Filled 2011-09-09 (×3): qty 1

## 2011-09-09 MED ORDER — TRAZODONE HCL 150 MG PO TABS
150.0000 mg | ORAL_TABLET | Freq: Every day | ORAL | Status: DC
  Administered 2011-09-09 – 2011-09-10 (×2): 150 mg via ORAL
  Filled 2011-09-09 (×3): qty 1

## 2011-09-09 MED ORDER — HYDROCODONE-ACETAMINOPHEN 5-500 MG PO CAPS: 1.0000 | ORAL_CAPSULE | Freq: Four times a day (QID) | ORAL | Status: DC | PRN

## 2011-09-09 MED ORDER — ZOLPIDEM TARTRATE 5 MG PO TABS
5.0000 mg | ORAL_TABLET | Freq: Every evening | ORAL | Status: DC | PRN
  Administered 2011-09-10 (×2): 5 mg via ORAL
  Filled 2011-09-09 (×2): qty 1

## 2011-09-09 MED ORDER — SODIUM CHLORIDE 0.9 % IJ SOLN
3.0000 mL | Freq: Two times a day (BID) | INTRAMUSCULAR | Status: DC
  Administered 2011-09-10 – 2011-09-11 (×2): 3 mL via INTRAVENOUS

## 2011-09-09 MED ORDER — METOPROLOL SUCCINATE ER 50 MG PO TB24
50.0000 mg | ORAL_TABLET | Freq: Every day | ORAL | Status: DC
  Administered 2011-09-10 – 2011-09-11 (×2): 50 mg via ORAL
  Filled 2011-09-09 (×2): qty 1

## 2011-09-09 MED ORDER — ACETAMINOPHEN 650 MG RE SUPP: 650.0000 mg | Freq: Four times a day (QID) | RECTAL | Status: DC | PRN

## 2011-09-09 MED ORDER — DILTIAZEM HCL 25 MG/5ML IV SOLN
10.0000 mg | Freq: Once | INTRAVENOUS | Status: AC
  Administered 2011-09-09: 10 mg via INTRAVENOUS
  Filled 2011-09-09: qty 5

## 2011-09-09 MED ORDER — DILTIAZEM HCL ER COATED BEADS 240 MG PO CP24
240.0000 mg | ORAL_CAPSULE | Freq: Every day | ORAL | Status: DC
  Administered 2011-09-10 – 2011-09-11 (×2): 240 mg via ORAL
  Filled 2011-09-09 (×2): qty 1

## 2011-09-09 NOTE — ED Notes (Addendum)
Patient reports sudden onset of palpitations.  She states she could not count her pulse due to irregular and fast rate.  Patient forgot to take her meds this morning.  She took her meds at 1530.  Heart rate is 100's to 160's at rest.

## 2011-09-09 NOTE — ED Notes (Signed)
Pt refused wheelchair to bathroom. Pt had to stop three times while ambulating for shooting pains in chest. Wheelchair taken back to bed for patient

## 2011-09-09 NOTE — ED Notes (Signed)
Titrated Cardizem drip started at 5 mg/hr.

## 2011-09-09 NOTE — H&P (Addendum)
Jacqueline Kim is an 66 y.o. female.   Cardiologist - Dr.Weintrobe [facility]. Chief Complaint: Palpitations. HPI: 66 year-old female with known history of tachybradycardia syndrome status post pacemaker placement, hypothyroidism, osteoarthritis presented to the ER because of palpitations. Patient's palpitations are around 4 PM and was persisting. Patient felt slightly short of breath with chest tightness and dizziness at the time of onset. In the ER patient was found to be in A. fib with RVR. The initial Cardizem bolus control the heart rate but after a few minutes it started going back into RVR. Cardizem infusion is started now and patient has been admitted for further management. Cardiologist on call for [facility], Dr. Allyson Sabal was notified by the ER physician and at this time they have requested hospitalist admission. Presently patient is chest pain-free, denies any shortness of breath, dizziness, focal deficits, nausea vomiting abdominal pain, or headaches.  Past Medical History  Diagnosis Date  . Paroxysmal a-fib diagnosed 2008  . 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 Hypothyroid 05/27/2011  . Pacemaker Medtronic  (REVO) placed10/23/12 04/08/11  . Anxiety   . Hypertension   . GERD (gastroesophageal reflux disease)   . Glaucoma   . Complication of anesthesia     DIFFICULTY WAKING UP  . Shortness of breath     AF LAST 3 EPI1SODES SINCE AUG  . Arthritis   . Headache     OCCASIONALLY  . Anticoagulant long-term use  WITH PRADAXA 05/27/2011  . DJD (degenerative joint disease)     Past Surgical History  Procedure Date  . Pacemaker insertion 04/08/11    MDT Revo implanted by Dr Rubie Maid  . Tonsillectomy   . Eye surgery     CATERACTS  . Abdominal hysterectomy     Family History  Problem Relation Age of Onset  . Anesthesia problems Mother    Social History:  reports that she has never smoked. She has never used smokeless tobacco.  She reports that she drinks about 6 ounces of alcohol per week. She reports that she does not use illicit drugs.  Allergies:  Allergies  Allergen Reactions  . Penicillins Anaphylaxis    Medications Prior to Admission  Medication Dose Route Frequency Provider Last Rate Last Dose  . acetaminophen (TYLENOL) tablet 650 mg  650 mg Oral Once Loren Racer, MD   650 mg at 09/09/11 1907  . diltiazem (CARDIZEM) 100 mg in dextrose 5 % 100 mL infusion  5-15 mg/hr Intravenous Titrated Loren Racer, MD 5 mL/hr at 09/09/11 1941 5 mg/hr at 09/09/11 1941  . diltiazem (CARDIZEM) injection 10 mg  10 mg Intravenous Once Loren Racer, MD   10 mg at 09/09/11 1730  . sodium chloride 0.9 % bolus 500 mL  500 mL Intravenous Once Loren Racer, MD   500 mL at 09/09/11 1730   Medications Prior to Admission  Medication Sig Dispense Refill  . Biotin 10 MG TABS Take 1 tablet by mouth daily.       Marland Kitchen buPROPion (WELLBUTRIN XL) 300 MG 24 hr tablet Take 300 mg by mouth daily.        . celecoxib (CELEBREX) 200 MG capsule Take 200 mg by mouth daily.       Marland Kitchen conjugated estrogens (PREMARIN) vaginal cream Place 1.5 g vaginally 3 (three) times a week.        . dabigatran (PRADAXA) 150 MG CAPS Take 150 mg by mouth every 12 (twelve) hours.        Marland Kitchen  estrogens, conjugated, (PREMARIN) 0.3 MG tablet Take 0.3 mg by mouth daily. Take daily for 21 days then do not take for 7 days.       . hydrocodone-acetaminophen (LORCET-HD) 5-500 MG per capsule Take 1 capsule by mouth every 6 (six) hours as needed. For pain       . levothyroxine (SYNTHROID, LEVOTHROID) 100 MCG tablet Take 100 mcg by mouth daily.        . metoprolol succinate (TOPROL-XL) 50 MG 24 hr tablet Take 50 mg by mouth daily.      . Multiple Vitamins-Minerals (MULTIVITAMINS THER. W/MINERALS) TABS Take 1 tablet by mouth at bedtime.        . propafenone (RYTHMOL SR) 225 MG 12 hr capsule Take 225 mg by mouth every 12 (twelve) hours.      . traZODone (DESYREL) 150 MG tablet  Take 150 mg by mouth at bedtime.        . zaleplon (SONATA) 10 MG capsule Take 10 mg by mouth at bedtime.          Results for orders placed during the hospital encounter of 09/09/11 (from the past 48 hour(s))  CBC     Status: Normal   Collection Time   09/09/11  4:55 PM      Component Value Range Comment   WBC 4.6  4.0 - 10.5 (K/uL)    RBC 4.00  3.87 - 5.11 (MIL/uL)    Hemoglobin 12.7  12.0 - 15.0 (g/dL)    HCT 84.6  96.2 - 95.2 (%)    MCV 94.3  78.0 - 100.0 (fL)    MCH 31.8  26.0 - 34.0 (pg)    MCHC 33.7  30.0 - 36.0 (g/dL)    RDW 84.1  32.4 - 40.1 (%)    Platelets 218  150 - 400 (K/uL)   DIFFERENTIAL     Status: Normal   Collection Time   09/09/11  4:55 PM      Component Value Range Comment   Neutrophils Relative 51  43 - 77 (%)    Neutro Abs 2.3  1.7 - 7.7 (K/uL)    Lymphocytes Relative 33  12 - 46 (%)    Lymphs Abs 1.5  0.7 - 4.0 (K/uL)    Monocytes Relative 10  3 - 12 (%)    Monocytes Absolute 0.5  0.1 - 1.0 (K/uL)    Eosinophils Relative 5  0 - 5 (%)    Eosinophils Absolute 0.3  0.0 - 0.7 (K/uL)    Basophils Relative 1  0 - 1 (%)    Basophils Absolute 0.0  0.0 - 0.1 (K/uL)   BASIC METABOLIC PANEL     Status: Abnormal   Collection Time   09/09/11  4:55 PM      Component Value Range Comment   Sodium 136  135 - 145 (mEq/L)    Potassium 3.6  3.5 - 5.1 (mEq/L)    Chloride 102  96 - 112 (mEq/L)    CO2 23  19 - 32 (mEq/L)    Glucose, Bld 130 (*) 70 - 99 (mg/dL)    BUN 22  6 - 23 (mg/dL)    Creatinine, Ser 0.27  0.50 - 1.10 (mg/dL)    Calcium 9.9  8.4 - 10.5 (mg/dL)    GFR calc non Af Amer 86 (*) >90 (mL/min)    GFR calc Af Amer >90  >90 (mL/min)   PROTIME-INR     Status: Abnormal   Collection Time  09/09/11  4:55 PM      Component Value Range Comment   Prothrombin Time 17.3 (*) 11.6 - 15.2 (seconds)    INR 1.39  0.00 - 1.49    APTT     Status: Abnormal   Collection Time   09/09/11  4:55 PM      Component Value Range Comment   aPTT 47 (*) 24 - 37 (seconds)     CARDIAC PANEL(CRET KIN+CKTOT+MB+TROPI)     Status: Normal   Collection Time   09/09/11  4:55 PM      Component Value Range Comment   Total CK 62  7 - 177 (U/L)    CK, MB 2.6  0.3 - 4.0 (ng/mL)    Troponin I <0.30  <0.30 (ng/mL)    Relative Index RELATIVE INDEX IS INVALID  0.0 - 2.5     Dg Chest Port 1 View  09/09/2011  *RADIOLOGY REPORT*  Clinical Data: 66 year old female with palpitations.  PORTABLE CHEST - 1 VIEW  Comparison: 05/27/2011  Findings: The cardiomediastinal silhouette is unremarkable. A left-sided pacemaker is again identified. There is no evidence of focal airspace disease, pulmonary edema, suspicious pulmonary nodule/mass, pleural effusion, or pneumothorax. No acute bony abnormalities are identified.  IMPRESSION: No evidence of active cardiopulmonary disease.  Original Report Authenticated By: Rosendo Gros, M.D.    Review of Systems  Constitutional: Negative.   HENT: Negative.   Eyes: Negative.   Respiratory: Negative.   Cardiovascular: Positive for palpitations.  Gastrointestinal: Negative.   Genitourinary: Negative.   Musculoskeletal: Negative.   Skin: Negative.   Neurological: Positive for dizziness.  Endo/Heme/Allergies: Negative.   Psychiatric/Behavioral: Negative.     Blood pressure 133/90, pulse 114, temperature 98.2 F (36.8 C), temperature source Oral, resp. rate 17, height 5' 4.75" (1.645 m), weight 64.411 kg (142 lb), SpO2 98.00%. Physical Exam  Constitutional: She is oriented to person, place, and time. She appears well-developed and well-nourished. No distress.  HENT:  Head: Normocephalic and atraumatic.  Eyes: Conjunctivae are normal. Pupils are equal, round, and reactive to light. Right eye exhibits no discharge. Left eye exhibits no discharge.  Neck: Normal range of motion. Neck supple.  Cardiovascular:       Irregularly irregular rhythm.  Respiratory: Effort normal and breath sounds normal. No respiratory distress. She has no wheezes. She has  no rales.  GI: Soft. Bowel sounds are normal. She exhibits no distension. There is no tenderness. There is no rebound.  Musculoskeletal: Normal range of motion. She exhibits no edema and no tenderness.  Neurological: She is alert and oriented to person, place, and time.       Moves all extremities.  Skin: Skin is warm and dry. No rash noted. She is not diaphoretic. No erythema.  Psychiatric: Her behavior is normal.     Assessment/Plan #1. Atrial fibrillation with rapid ventricular rate - patient states she did not take the Rythmol until late in the evening which may have precipitated the A. fib with RVR. At this time we will continue the Cardizem infusion and slowly taper off once he takes the regular by mouth medications. Check TSH d-dimer and cardiac enzymes. #2. History of hypothyroidism and osteoarthritis - continue present medications and check TSH. #3.Patient states she drinks 2 to 3 glasses of wine every night but can stay without it. If patient gets agitated or anxious may need ativan.  CODE STATUS - full code.  Eduard Clos. 09/09/2011, 8:59 PM

## 2011-09-09 NOTE — ED Provider Notes (Signed)
History     CSN: [id number]  Arrival date & time 09/09/11  1616   First MD Initiated Contact with Patient 09/09/11 1626      Chief Complaint  Patient presents with  . Irregular Heart Beat    (Consider location/radiation/quality/duration/timing/severity/associated sxs/prior treatment) HPI Pt has history of afib and states she started having palpitations at 1600 today. She denies CP, SOB. Mild dizziness when she sits up. No recent illness. She is followed by Dr [name] [facility].   Past Medical History  Diagnosis Date  . Paroxysmal a-fib diagnosed 2008  . 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 Hypothyroid 05/27/2011  . Pacemaker Medtronic  (REVO) placed10/23/12 04/08/11  . Anxiety   . Hypertension   . GERD (gastroesophageal reflux disease)   . Glaucoma   . Complication of anesthesia     DIFFICULTY WAKING UP  . Shortness of breath     AF LAST 3 EPI1SODES SINCE AUG  . Arthritis   . Headache     OCCASIONALLY  . Anticoagulant long-term use  WITH PRADAXA 05/27/2011  . DJD (degenerative joint disease)     Past Surgical History  Procedure Date  . Pacemaker insertion 04/08/11    MDT Revo implanted by Dr Rubie Maid  . Tonsillectomy   . Eye surgery     CATERACTS  . Abdominal hysterectomy     Family History  Problem Relation Age of Onset  . Anesthesia problems Mother     History  Substance Use Topics  . Smoking status: Never Smoker   . Smokeless tobacco: Never Used  . Alcohol Use: 6.0 oz/week    10 Glasses of wine per week    OB History    Grav Para Term Preterm Abortions TAB SAB Ect Mult Living                  Review of Systems  Constitutional: Negative for fever and chills.  Respiratory: Negative for cough, shortness of breath and wheezing.   Cardiovascular: Positive for palpitations. Negative for chest pain and leg swelling.  Gastrointestinal: Negative for nausea, vomiting and abdominal pain.  Musculoskeletal:  Negative for back pain.  Neurological: Positive for dizziness and light-headedness. Negative for numbness and headaches.    Allergies  Penicillins  Home Medications   Current Outpatient Rx  Name Route Sig Dispense Refill  . BIOTIN 10 MG PO TABS Oral Take 1 tablet by mouth daily.     . BUPROPION HCL ER (XL) 300 MG PO TB24 Oral Take 300 mg by mouth daily.      . CELECOXIB 200 MG PO CAPS Oral Take 200 mg by mouth daily.     Marland Kitchen ESTROGENS, CONJUGATED 0.625 MG/GM VA CREA Vaginal Place 1.5 g vaginally 3 (three) times a week.      Marland Kitchen DABIGATRAN ETEXILATE MESYLATE 150 MG PO CAPS Oral Take 150 mg by mouth every 12 (twelve) hours.      . ESTROGENS CONJUGATED 0.3 MG PO TABS Oral Take 0.3 mg by mouth daily. Take daily for 21 days then do not take for 7 days.     Marland Kitchen HYDROCODONE-ACETAMINOPHEN 5-500 MG PO CAPS Oral Take 1 capsule by mouth every 6 (six) hours as needed. For pain     . LEVOTHYROXINE SODIUM 100 MCG PO TABS Oral Take 100 mcg by mouth daily.      Marland Kitchen METOPROLOL SUCCINATE ER 50 MG PO TB24 Oral Take 50 mg by mouth daily.    Marland Kitchen  THERA M PLUS PO TABS Oral Take 1 tablet by mouth at bedtime.      Marland Kitchen PROPAFENONE HCL ER 225 MG PO CP12 Oral Take 225 mg by mouth every 12 (twelve) hours.    . TRAZODONE HCL 150 MG PO TABS Oral Take 150 mg by mouth at bedtime.      Marland Kitchen ZALEPLON 10 MG PO CAPS Oral Take 10 mg by mouth at bedtime.        BP 132/89  Pulse 117  Temp(Src) 98.2 F (36.8 C) (Oral)  Resp 19  Ht 5' 4.75" (1.645 m)  Wt 142 lb (64.411 kg)  BMI 23.81 kg/m2  SpO2 97%  Physical Exam  Nursing note and vitals reviewed. Constitutional: She is oriented to person, place, and time. She appears well-developed and well-nourished. No distress.  HENT:  Head: Normocephalic and atraumatic.  Mouth/Throat: Oropharynx is clear and moist.  Eyes: EOM are normal. Pupils are equal, round, and reactive to light.  Neck: Normal range of motion. Neck supple.  Cardiovascular:       irreg irreg  Pulmonary/Chest: Effort  normal and breath sounds normal. No respiratory distress. She has no wheezes. She has no rales.  Abdominal: Soft. Bowel sounds are normal. There is no tenderness. There is no rebound and no guarding.  Musculoskeletal: Normal range of motion. She exhibits no edema and no tenderness.  Neurological: She is alert and oriented to person, place, and time.       5/5 motor, sensation intact  Skin: Skin is warm and dry. No rash noted. No erythema.  Psychiatric: She has a normal mood and affect. Her behavior is normal.    ED Course  Procedures (including critical care time)  Labs Reviewed  BASIC METABOLIC PANEL - Abnormal; Notable for the following:    Glucose, Bld 130 (*)    GFR calc non Af Amer 86 (*)    All other components within normal limits  PROTIME-INR - Abnormal; Notable for the following:    Prothrombin Time 17.3 (*)    All other components within normal limits  APTT - Abnormal; Notable for the following:    aPTT 47 (*)    All other components within normal limits  CBC  DIFFERENTIAL  CARDIAC PANEL(CRET KIN+CKTOT+MB+TROPI)   Dg Chest Port 1 View  09/09/2011  *RADIOLOGY REPORT*  Clinical Data: 66 year old female with palpitations.  PORTABLE CHEST - 1 VIEW  Comparison: 05/27/2011  Findings: The cardiomediastinal silhouette is unremarkable. A left-sided pacemaker is again identified. There is no evidence of focal airspace disease, pulmonary edema, suspicious pulmonary nodule/mass, pleural effusion, or pneumothorax. No acute bony abnormalities are identified.  IMPRESSION: No evidence of active cardiopulmonary disease.  Original Report Authenticated By: Rosendo Gros, M.D.     1. Atrial fibrillation with RVR      Date: 09/09/2011  Rate: 109  Rhythm: atrial fibrillation  QRS Axis: normal  Intervals: normal  ST/T Wave abnormalities: normal  Conduction Disutrbances:none  Narrative Interpretation:   Old EKG Reviewed: none available    MDM  Pt HR improved with cardizem bolus to  100's then back up to 140's. IV drip started. Discussed with Dr Allyson Sabal who asked to [facility] and will see in AM.         Loren Racer, MD 09/09/11 [phone]

## 2011-09-09 NOTE — ED Notes (Signed)
Called and gave report to [place].

## 2011-09-09 NOTE — ED Notes (Signed)
Pt helped to the bathroom to void again.  Family is at the bedside.  No distress

## 2011-09-10 ENCOUNTER — Encounter (HOSPITAL_COMMUNITY): Payer: Self-pay | Admitting: Cardiology

## 2011-09-10 LAB — COMPREHENSIVE METABOLIC PANEL
AST: 18 U/L (ref 0–37)
Albumin: 3.2 g/dL — ABNORMAL LOW (ref 3.5–5.2)
Alkaline Phosphatase: 26 U/L — ABNORMAL LOW (ref 39–117)
BUN: 16 mg/dL (ref 6–23)
CO2: 24 mEq/L (ref 19–32)
Chloride: 107 mEq/L (ref 96–112)
GFR calc non Af Amer: 88 mL/min — ABNORMAL LOW (ref >90)
Potassium: 3.3 mEq/L — ABNORMAL LOW (ref 3.5–5.1)
Total Bilirubin: 0.2 mg/dL — ABNORMAL LOW (ref 0.3–1.2)

## 2011-09-10 LAB — CBC
MCH: 32.2 pg (ref 26.0–34.0)
MCV: 94.4 fL (ref 78.0–100.0)
Platelets: 203 10*3/uL (ref 150–400)
RDW: 12.8 % (ref 11.5–15.5)

## 2011-09-10 MED ORDER — PROPAFENONE HCL ER 325 MG PO CP12
325.0000 mg | ORAL_CAPSULE | Freq: Two times a day (BID) | ORAL | Status: DC
  Administered 2011-09-10 – 2011-09-11 (×2): 325 mg via ORAL
  Filled 2011-09-10 (×3): qty 1

## 2011-09-10 MED ORDER — POTASSIUM CHLORIDE CRYS ER 20 MEQ PO TBCR
40.0000 meq | EXTENDED_RELEASE_TABLET | Freq: Once | ORAL | Status: AC
  Administered 2011-09-10: 40 meq via ORAL
  Filled 2011-09-10: qty 2

## 2011-09-10 MED ORDER — PROPAFENONE HCL 150 MG PO TABS
150.0000 mg | ORAL_TABLET | Freq: Once | ORAL | Status: AC
  Administered 2011-09-10: 150 mg via ORAL
  Filled 2011-09-10: qty 1

## 2011-09-10 NOTE — Progress Notes (Signed)
Pt converted from A-fib to A-paced which is her normal rhythm. MD notified and Cardizem gtt has been turned off.

## 2011-09-10 NOTE — Progress Notes (Signed)
UR Completed. Jacqueline Kim, Jacqueline Kim [phone]  

## 2011-09-10 NOTE — Consult Note (Signed)
Reason for Consult: A. Fib with RVR   Referring Physician: Dr. Jackie Plum is an 66 y.o. female.    Chief Complaint: Palpitations with associated alight SOB, chest tightness and dizziness.  HPI: 66 year-old female with known history of tachybradycardia syndrome status post pacemaker placement, hypothyroidism, osteoarthritis presented to the ER  because of palpitations. Patient's palpitations began around 4 PM and was persisting. Patient felt slightly short of breath with chest tightness and dizziness- to near syncope, at the time of onset. She feels this was the worst onset of the atrial fib with the chest pressure and dizziness, and near syncope.  In the ER 09/09/11 patient was found to be in A. fib with RVR. The initial Cardizem bolus controlled the heart rate but after a few minutes it started going back into RVR. Cardizem infusion is started now and patient was admitted for further management. Cardiologist on call for [facility] heart and vascular, Dr. Allyson Sabal was notified by the ER physician and at this time they had requested hospitalist admission. On admit patient was chest pain-free, denied any shortness of breath, dizziness, focal deficits, nausea, vomiting, abdominal pain, or headaches.  She does note that she took her am Rythmol around 3:00 pm yesterday.    She continues in A. Fib.  Heart rate in 90's to 110's.  No chest pain, no SOB. She has occ. Paced beat.  Her cardizem drip was d/c'd due to hypotension down to 86 systolic.  BP has improved since that time.  Cardizem 240 po has been added to her Rythmol and toprol.      Cardiac History: She does have sick sinus syndrome, i.e. tachybrady syndrome with both bradycardia and  AFib with rapid ventricular response. When she took her medications appropriately, she developed bradycardia. She underwent  Implantation of dual chamber PPM with a Medtronic MRI safe Revo pacer, placed 04/07/12 by Dr. Royann Shivers.  She has also had EP  consult for a. Fib ablation with Dr. Johney Frame in December 2012.    At that time she wished to resume her rythmol which she had stopped precipitating an episode of A. Fib.    No History of coronary disease and had a negative nuclear stress test in July of  2008, with EF of 69%. Her last echo in May of 2010 revealed mild mitral  regurg, mild tricuspid regurg, EF was 55%.  Other history includes hypothyroidism as stated previously,  hypertension, and gastroesophageal reflux disease. She had a history of  glaucoma, but after cataracts were removed, her pressures have been  stable.  Past Medical History  Diagnosis Date  . Paroxysmal a-fib diagnosed 2008  . 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 Hypothyroid 05/27/2011  . Pacemaker Medtronic  (REVO) placed10/23/12 04/08/11  . Anxiety   . Hypertension   . GERD (gastroesophageal reflux disease)   . Glaucoma   . Complication of anesthesia     DIFFICULTY WAKING UP  . Shortness of breath     AF LAST 3 [id number] SINCE AUG  . Arthritis   . Headache     OCCASIONALLY  . Anticoagulant long-term use  WITH PRADAXA 05/27/2011  . DJD (degenerative joint disease)     Past Surgical History  Procedure Date  . Pacemaker insertion 04/08/11    MDT Revo implanted by Dr Rubie Maid  . Tonsillectomy   . Eye surgery     CATERACTS  . Abdominal hysterectomy   . Insert / replace /  remove pacemaker     Family History  Problem Relation Age of Onset  . Anesthesia problems Mother    Social History:  reports that she has never smoked. She has never used smokeless tobacco. She reports that she drinks about 6 ounces of alcohol per week. She reports that she does not use illicit drugs. Married, 3 children.  Allergies:  Allergies  Allergen Reactions  . Penicillins Anaphylaxis    Medications Prior to Admission  Medication Dose Route Frequency Provider Last Rate Last Dose  . acetaminophen (TYLENOL) tablet 650 mg  650 mg Oral  Q6H PRN Eduard Clos, MD       Or  . acetaminophen (TYLENOL) suppository 650 mg  650 mg Rectal Q6H PRN Eduard Clos, MD      . acetaminophen (TYLENOL) tablet 650 mg  650 mg Oral Once Loren Racer, MD   650 mg at 09/09/11 1907  . buPROPion (WELLBUTRIN XL) 24 hr tablet 300 mg  300 mg Oral Daily Eduard Clos, MD      . dabigatran (PRADAXA) capsule 150 mg  150 mg Oral Q12H Eduard Clos, MD   150 mg at 09/09/11 2252  . diltiazem (CARDIZEM CD) 24 hr capsule 240 mg  240 mg Oral Daily Eduard Clos, MD      . diltiazem (CARDIZEM) 100 mg in dextrose 5 % 100 mL infusion  5-15 mg/hr Intravenous Titrated Loren Racer, MD 5 mL/hr at 09/10/11 0800 5 mg/hr at 09/10/11 0800  . diltiazem (CARDIZEM) injection 10 mg  10 mg Intravenous Once Loren Racer, MD   10 mg at 09/09/11 1730  . HYDROcodone-acetaminophen (NORCO) 5-325 MG per tablet 1 tablet  1 tablet Oral Q6H PRN Lonia Blood, MD   1 tablet at 09/09/11 2253  . levothyroxine (SYNTHROID, LEVOTHROID) tablet 100 mcg  100 mcg Oral Daily Eduard Clos, MD      . metoprolol succinate (TOPROL-XL) 24 hr tablet 50 mg  50 mg Oral Daily Eduard Clos, MD      . mulitivitamin with minerals tablet 1 tablet  1 tablet Oral Daily Lonia Blood, MD      . ondansetron [facility]) tablet 4 mg  4 mg Oral Q6H PRN Eduard Clos, MD       Or  . ondansetron [facility]) injection 4 mg  4 mg Intravenous Q6H PRN Eduard Clos, MD      . propafenone (RYTHMOL SR) 12 hr capsule 225 mg  225 mg Oral Q12H Eduard Clos, MD   225 mg at 09/09/11 2254  . sodium chloride 0.9 % bolus 500 mL  500 mL Intravenous Once Loren Racer, MD   500 mL at 09/09/11 1730  . sodium chloride 0.9 % injection 3 mL  3 mL Intravenous Q12H Eduard Clos, MD      . sodium chloride 0.9 % injection 3 mL  3 mL Intravenous Q12H Eduard Clos, MD      . traZODone (DESYREL) tablet 150 mg  150 mg Oral QHS Eduard Clos, MD   150 mg  at 09/09/11 2253  . zolpidem (AMBIEN) tablet 5 mg  5 mg Oral QHS PRN Eduard Clos, MD   5 mg at 09/10/11 0008  . DISCONTD: Biotin TABS 10 mg  1 tablet Oral Daily Eduard Clos, MD      . DISCONTD: hydrocodone-acetaminophen (LORCET-HD) 5-500 MG per capsule 1 capsule  1 capsule Oral Q6H PRN Eduard Clos, MD      .  DISCONTD: multivitamins ther. w/minerals tablet 1 tablet  1 tablet Oral QHS Eduard Clos, MD       Medications Prior to Admission  Medication Sig Dispense Refill  . Biotin 10 MG TABS Take 1 tablet by mouth daily.       Marland Kitchen buPROPion (WELLBUTRIN XL) 300 MG 24 hr tablet Take 300 mg by mouth daily.        . celecoxib (CELEBREX) 200 MG capsule Take 200 mg by mouth daily.       Marland Kitchen conjugated estrogens (PREMARIN) vaginal cream Place 1.5 g vaginally 3 (three) times a week.        . dabigatran (PRADAXA) 150 MG CAPS Take 150 mg by mouth every 12 (twelve) hours.        Marland Kitchen estrogens, conjugated, (PREMARIN) 0.3 MG tablet Take 0.3 mg by mouth daily. Take daily for 21 days then do not take for 7 days.       . hydrocodone-acetaminophen (LORCET-HD) 5-500 MG per capsule Take 1 capsule by mouth every 6 (six) hours as needed. For pain       . levothyroxine (SYNTHROID, LEVOTHROID) 100 MCG tablet Take 100 mcg by mouth daily.        . metoprolol succinate (TOPROL-XL) 50 MG 24 hr tablet Take 50 mg by mouth daily.      . Multiple Vitamins-Minerals (MULTIVITAMINS THER. W/MINERALS) TABS Take 1 tablet by mouth at bedtime.        . propafenone (RYTHMOL SR) 225 MG 12 hr capsule Take 225 mg by mouth every 12 (twelve) hours.      . traZODone (DESYREL) 150 MG tablet Take 150 mg by mouth at bedtime.        . zaleplon (SONATA) 10 MG capsule Take 10 mg by mouth at bedtime.          Results for orders placed during the hospital encounter of 09/09/11 (from the past 48 hour(s))  CBC     Status: Normal   Collection Time   09/09/11  4:55 PM      Component Value Range Comment   WBC 4.6  4.0 - 10.5  (K/uL)    RBC 4.00  3.87 - 5.11 (MIL/uL)    Hemoglobin 12.7  12.0 - 15.0 (g/dL)    HCT 16.1  09.6 - 04.5 (%)    MCV 94.3  78.0 - 100.0 (fL)    MCH 31.8  26.0 - 34.0 (pg)    MCHC 33.7  30.0 - 36.0 (g/dL)    RDW 40.9  81.1 - 91.4 (%)    Platelets 218  150 - 400 (K/uL)   DIFFERENTIAL     Status: Normal   Collection Time   09/09/11  4:55 PM      Component Value Range Comment   Neutrophils Relative 51  43 - 77 (%)    Neutro Abs 2.3  1.7 - 7.7 (K/uL)    Lymphocytes Relative 33  12 - 46 (%)    Lymphs Abs 1.5  0.7 - 4.0 (K/uL)    Monocytes Relative 10  3 - 12 (%)    Monocytes Absolute 0.5  0.1 - 1.0 (K/uL)    Eosinophils Relative 5  0 - 5 (%)    Eosinophils Absolute 0.3  0.0 - 0.7 (K/uL)    Basophils Relative 1  0 - 1 (%)    Basophils Absolute 0.0  0.0 - 0.1 (K/uL)   BASIC METABOLIC PANEL     Status: Abnormal   Collection Time  09/09/11  4:55 PM      Component Value Range Comment   Sodium 136  135 - 145 (mEq/L)    Potassium 3.6  3.5 - 5.1 (mEq/L)    Chloride 102  96 - 112 (mEq/L)    CO2 23  19 - 32 (mEq/L)    Glucose, Bld 130 (*) 70 - 99 (mg/dL)    BUN 22  6 - 23 (mg/dL)    Creatinine, Ser 1.61  0.50 - 1.10 (mg/dL)    Calcium 9.9  8.4 - 10.5 (mg/dL)    GFR calc non Af Amer 86 (*) >90 (mL/min)    GFR calc Af Amer >90  >90 (mL/min)   PROTIME-INR     Status: Abnormal   Collection Time   09/09/11  4:55 PM      Component Value Range Comment   Prothrombin Time 17.3 (*) 11.6 - 15.2 (seconds)    INR 1.39  0.00 - 1.49    APTT     Status: Abnormal   Collection Time   09/09/11  4:55 PM      Component Value Range Comment   aPTT 47 (*) 24 - 37 (seconds)   CARDIAC PANEL(CRET KIN+CKTOT+MB+TROPI)     Status: Normal   Collection Time   09/09/11  4:55 PM      Component Value Range Comment   Total CK 62  7 - 177 (U/L)    CK, MB 2.6  0.3 - 4.0 (ng/mL)    Troponin I <0.30  <0.30 (ng/mL)    Relative Index RELATIVE INDEX IS INVALID  0.0 - 2.5    MRSA PCR SCREENING     Status: Normal    Collection Time   09/09/11  9:12 PM      Component Value Range Comment   MRSA by PCR NEGATIVE  NEGATIVE    CARDIAC PANEL(CRET KIN+CKTOT+MB+TROPI)     Status: Normal   Collection Time   09/09/11  9:41 PM      Component Value Range Comment   Total CK 55  7 - 177 (U/L)    CK, MB 2.7  0.3 - 4.0 (ng/mL)    Troponin I <0.30  <0.30 (ng/mL)    Relative Index RELATIVE INDEX IS INVALID  0.0 - 2.5    D-DIMER, QUANTITATIVE     Status: Abnormal   Collection Time   09/09/11  9:41 PM      Component Value Range Comment   D-Dimer, Quant 0.71 (*) 0.00 - 0.48 (ug/mL-FEU)   TSH     Status: Normal   Collection Time   09/09/11  9:41 PM      Component Value Range Comment   TSH 3.226  0.350 - 4.500 (uIU/mL)   MAGNESIUM     Status: Normal   Collection Time   09/09/11  9:41 PM      Component Value Range Comment   Magnesium 1.6  1.5 - 2.5 (mg/dL)   COMPREHENSIVE METABOLIC PANEL     Status: Abnormal   Collection Time   09/10/11  3:50 AM      Component Value Range Comment   Sodium 141  135 - 145 (mEq/L)    Potassium 3.3 (*) 3.5 - 5.1 (mEq/L)    Chloride 107  96 - 112 (mEq/L)    CO2 24  19 - 32 (mEq/L)    Glucose, Bld 150 (*) 70 - 99 (mg/dL)    BUN 16  6 - 23 (mg/dL)    Creatinine, Ser 0.96  0.50 - 1.10 (mg/dL)    Calcium 8.9  8.4 - 10.5 (mg/dL)    Total Protein 5.5 (*) 6.0 - 8.3 (g/dL)    Albumin 3.2 (*) 3.5 - 5.2 (g/dL)    AST 18  0 - 37 (U/L)    ALT 11  0 - 35 (U/L)    Alkaline Phosphatase 26 (*) 39 - 117 (U/L)    Total Bilirubin 0.2 (*) 0.3 - 1.2 (mg/dL)    GFR calc non Af Amer 88 (*) >90 (mL/min)    GFR calc Af Amer >90  >90 (mL/min)   CBC     Status: Normal   Collection Time   09/10/11  3:50 AM      Component Value Range Comment   WBC 4.4  4.0 - 10.5 (K/uL)    RBC 3.95  3.87 - 5.11 (MIL/uL)    Hemoglobin 12.7  12.0 - 15.0 (g/dL)    HCT 16.1  09.6 - 04.5 (%)    MCV 94.4  78.0 - 100.0 (fL)    MCH 32.2  26.0 - 34.0 (pg)    MCHC 34.0  30.0 - 36.0 (g/dL)    RDW 40.9  81.1 - 91.4 (%)     Platelets 203  150 - 400 (K/uL)    Dg Chest Port 1 View  09/09/2011  *RADIOLOGY REPORT*  Clinical Data: 66 year old female with palpitations.  PORTABLE CHEST - 1 VIEW  Comparison: 05/27/2011  Findings: The cardiomediastinal silhouette is unremarkable. A left-sided pacemaker is again identified. There is no evidence of focal airspace disease, pulmonary edema, suspicious pulmonary nodule/mass, pleural effusion, or pneumothorax. No acute bony abnormalities are identified.  IMPRESSION: No evidence of active cardiopulmonary disease.  Original Report Authenticated By: Rosendo Gros, M.D.    ROS: General:No colds or fevers, had been doing well until yesterday, stated she has had more energy since the PPM. Skin:no rashes or ulcers. HEENT:no blurred vision CV:see HPI, but only chest pressure assoc. With the atrial fib.  PUL:See HPI NW:GNFAOZH last week of bright blood in the toilet, was seen by Dr. Ronne Binning and dx. With collapsed hemorrhoid, no bleeding since.  GU:no hematuria, no dysuria MS:no joint pain Neuro:near syncope yesterday with new onset a. Fib, but no other dizziness Endo:Thyroid is stable, no diabetes.   Blood pressure 101/69, pulse 96, temperature 98.6 F (37 C), temperature source Oral, resp. rate 17, height 5\' 5"  (1.651 m), weight 66.1 kg (145 lb 11.6 oz), SpO2 96.00%. PE: General:Alert and oriented X 3, MAE, follows commands, pleasant affect. Skin:W&D, brisk capillary refill HEENT:normocephalic, sclera clear. Neck:supple, no JVD, no carotid bruits Heart:S1S2 Irreg, irreg. No murmur. Lungs: clear without rales, rhonchi or wheezes Abd:+ BS, soft, non tender Ext:no edema, 2+ pedal pulses Neuro:A&O X 3, MAE.    Assessment/Plan Patient Active Problem List  Diagnoses  . Paroxysmal a-fib  . Sinus node dysfunction  . Tachycardia  . Hypothyroid  . Pacemaker Medtronic  (REVO) placed10/23/12  . Anxiety  . Anticoagulant long-term use  WITH PRADAXA  . Atrial fibrillation with RVR     PLAN:Continue current meds.  Negative cardiac enzymes.  No further chest pain.  ? Cause secondary to taking meds late.    She has had BK this Am.  If not converted by am then ?DCCV.  To receive meds at 1000.  Continue Pradaxa.  Monitor for rectal bleeding.    See Dr. Jenel Lucks consult note from 05/2012, copy in shadow chart. ? Out patient stress test once back in SR.  Last Nuc. Stress in 2008.  Dr. [name] to see for further eval.    We will be glad to take pt. On our service if [facility] hospitalist would like.    INGOLD,LAURA R 09/10/2011, 9:12 AM   Patient seen and examined. Agree with assessment and plan.  Pt is well known to me. She presents with recurrent AF late yesterday after missing her am dose of 225 SR propofenone.  Yesterday she was rushed with many commitments and suspect was in setting of increased  Catecholamine stimulation.  Telemetry now show AF rate 100 on cardizem drip at 5 mg/hr.  Will increase her Rhythmol SR to 325 mg bid, but since she has already received  a 225 mg dose this am, will give a short acting dose of 150 mg now. Need to replete K 3.2, and asses Mg. F/U ECG and follow QTc interval.   [name], MD, [facility] 09/10/2011 11:12 AM

## 2011-09-11 DIAGNOSIS — R7989 Other specified abnormal findings of blood chemistry: Secondary | ICD-10-CM | POA: Diagnosis present

## 2011-09-11 DIAGNOSIS — R079 Chest pain, unspecified: Secondary | ICD-10-CM | POA: Diagnosis present

## 2011-09-11 LAB — BASIC METABOLIC PANEL
GFR calc non Af Amer: 87 mL/min — ABNORMAL LOW (ref >90)
Glucose, Bld: 134 mg/dL — ABNORMAL HIGH (ref 70–99)
Potassium: 4.1 mEq/L (ref 3.5–5.1)
Sodium: 138 mEq/L (ref 135–145)

## 2011-09-11 MED ORDER — PROPAFENONE HCL ER 325 MG PO CP12: 325.0000 mg | ORAL_CAPSULE | Freq: Two times a day (BID) | ORAL | Status: DC

## 2011-09-11 MED ORDER — DILTIAZEM HCL ER COATED BEADS 240 MG PO CP24: 240.0000 mg | ORAL_CAPSULE | Freq: Every day | ORAL | Status: DC

## 2011-09-11 NOTE — Progress Notes (Signed)
Subjective: No complaints  Objective: Vital signs in last 24 hours: Temp:  [97.5 F (36.4 C)-98.4 F (36.9 C)] 97.5 F (36.4 C) (03/28 0800) Pulse Rate:  [60-93] 60  (03/28 0400) Resp:  [13-18] 13  (03/28 0400) BP: (107-142)/(66-80) 130/77 mmHg (03/28 0800) SpO2:  [92 %-98 %] 92 % (03/28 0400) Weight change:  Last BM Date: 09/09/11 Intake/Output from previous day: 03/27 0701 - 03/28 0700 In: 3 [I.V.:3] Out: -  Intake/Output this shift:    PE:     Lab Results:  Basename 09/10/11 0350 09/09/11 1655  WBC 4.4 4.6  HGB 12.7 12.7  HCT 37.3 37.7  PLT 203 218   BMET  Basename 09/10/11 0350 09/09/11 1655  NA 141 136  K 3.3* 3.6  CL 107 102  CO2 24 23  GLUCOSE 150* 130*  BUN 16 22  CREATININE 0.67 0.73  CALCIUM 8.9 9.9    Basename 09/09/11 2141 09/09/11 1655  TROPONINI <0.30 <0.30    Lab Results  Component Value Date   CHOL  Value: 172        ATP III CLASSIFICATION:  <200     mg/dL   Desirable  914-782  mg/dL   Borderline High  >=956    mg/dL   High 07/17/3084   HDL 37* 12/19/2006   LDLCALC  Value: 76        Total Cholesterol/HDL:CHD Risk Coronary Heart Disease Risk Table                     Men   Women  1/2 Average Risk   3.4   3.3 12/19/2006   TRIG 293* 12/19/2006   CHOLHDL 4.6 12/19/2006   Lab Results  Component Value Date   HGBA1C 5.3 04/01/2011     Lab Results  Component Value Date   TSH 3.226 09/09/2011    Hepatic Function Panel  Basename 09/10/11 0350  PROT 5.5*  ALBUMIN 3.2*  AST 18  ALT 11  ALKPHOS 26*  BILITOT 0.2*  BILIDIR --  IBILI --   No results found for this basename: CHOL in the last 72 hours No results found for this basename: PROTIME in the last 72 hours    EKG: Orders placed during the hospital encounter of 09/09/11  . EKG 12-LEAD  . EKG  . EKG 12-LEAD    Studies/Results: Dg Chest Port 1 View  09/09/2011  *RADIOLOGY REPORT*  Clinical Data: 66 year old female with palpitations.  PORTABLE CHEST - 1 VIEW  Comparison: 05/27/2011   Findings: The cardiomediastinal silhouette is unremarkable. A left-sided pacemaker is again identified. There is no evidence of focal airspace disease, pulmonary edema, suspicious pulmonary nodule/mass, pleural effusion, or pneumothorax. No acute bony abnormalities are identified.  IMPRESSION: No evidence of active cardiopulmonary disease.  Original Report Authenticated By: Rosendo Gros, M.D.    Medications: I have reviewed the patient's current medications.    Marland Kitchen buPROPion  300 mg Oral Daily  . dabigatran  150 mg Oral Q12H  . diltiazem  240 mg Oral Daily  . levothyroxine  100 mcg Oral Daily  . metoprolol succinate  50 mg Oral Daily  . mulitivitamin with minerals  1 tablet Oral Daily  . potassium chloride  40 mEq Oral Once  . propafenone  325 mg Oral Q12H  . propafenone  150 mg Oral Once  . sodium chloride  3 mL Intravenous Q12H  . sodium chloride  3 mL Intravenous Q12H  . traZODone  150 mg Oral QHS  .  DISCONTD: propafenone  225 mg Oral Q12H   Assessment/Plan: Patient Active Problem List  Diagnoses  . Paroxysmal a-fib  . Sinus node dysfunction  . Hypothyroid  . Pacemaker Medtronic  (REVO) placed10/23/12  . Anxiety  . Anticoagulant long-term use  WITH PRADAXA  . Atrial fibrillation with RVR, converted to SR 09/11/11 with IV cardizem and increased dose rythmol.  . D-dimer, elevated   PLAN:   Recheck K+ level and Magnesium.  Maintaining SR (converted yesterday) with increased rythmol and continuing po cardizem, and toprol. No further chest pain, negative enzymes. Plan for d/c home -continue pradaxa.  Will have pacemaker interrogated prior to discharge.  Pt. Seen and examined by Dr. Royann Shivers.  LOS: 2 days   INGOLD,LAURA R 09/11/2011, 9:25 AM   I have seen and examined the patient along with [facility] R, NP.  I have reviewed the chart, notes and new data.  I agree with NP's note.  Key new complaints: asymptomatic  Key examination changes: small-medium right upper calf  hematoma. No clinical signs of DVT Key new findings / data: Ddimer elevation may be hematoma related.   PLAN: Very low suspicion for VTE. DC after PM check, on higher propafenone dose. Asked her to reconsider RF ablation as an option.  Thurmon Fair, MD, [facility] [phone] 09/11/2011, 10:37 AM

## 2011-09-11 NOTE — Discharge Instructions (Signed)
Atrial Fibrillation Your caregiver has diagnosed you with atrial fibrillation (AFib). The heart normally beats very regularly; AFib is a type of irregular heartbeat. The heart rate may be faster or slower than normal. This can prevent your heart from pumping as well as it should. AFib can be constant (chronic) or intermittent (paroxysmal). CAUSES  Atrial fibrillation may be caused by:  Heart disease, including heart attack, coronary artery disease, heart failure, diseases of the heart valves, and others.   Blood clot in the lungs (pulmonary embolism).   Pneumonia or other infections.   Chronic lung disease.   Thyroid disease.   Toxins. These include alcohol, some medications (such as decongestant medications or diet pills), and caffeine.  In some people, no cause for AFib can be found. This is referred to as Lone Atrial Fibrillation. SYMPTOMS   Palpitations or a fluttering in your chest.   A vague sense of chest discomfort.   Shortness of breath.   Sudden onset of lightheadedness or weakness.  Sometimes, the first sign of AFib can be a complication of the condition. This could be a stroke or heart failure. DIAGNOSIS  Your description of your condition may make your caregiver suspicious of atrial fibrillation. Your caregiver will examine your pulse to determine if fibrillation is present. An EKG (electrocardiogram) will confirm the diagnosis. Further testing may help determine what caused you to have atrial fibrillation. This may include chest x-ray, echocardiogram, blood tests, or CT scans. PREVENTION  If you have previously had atrial fibrillation, your caregiver may advise you to avoid substances known to cause the condition (such as stimulant medications, and possibly caffeine or alcohol). You may be advised to use medications to prevent recurrence. Proper treatment of any underlying condition is important to help prevent recurrence. PROGNOSIS  Atrial fibrillation does tend to  become a chronic condition over time. It can cause significant complications (see below). Atrial fibrillation is not usually immediately life-threatening, but it can shorten your life expectancy. This seems to be worse in women. If you have lone atrial fibrillation and are under 60 years old, the risk of complications is very low, and life expectancy is not shortened. RISKS AND COMPLICATIONS  Complications of atrial fibrillation can include stroke, chest pain, and heart failure. Your caregiver will recommend treatments for the atrial fibrillation, as well as for any underlying conditions, to help minimize risk of complications. TREATMENT  Treatment for AFib is divided into several categories:  Treatment of any underlying condition.   Converting you out of AFib into a regular (sinus) rhythm.   Controlling rapid heart rate.   Prevention of blood clots and stroke.  Medications and procedures are available to convert your atrial fibrillation to sinus rhythm. However, recent studies have shown that this may not offer you any advantage, and cardiac experts are continuing research and debate on this topic. More important is controlling your rapid heartbeat. The rapid heartbeat causes more symptoms, and places strain on your heart. Your caregiver will advise you on the use of medications that can control your heart rate. Atrial fibrillation is a strong stroke risk. You can lessen this risk by taking blood thinning medications such as Coumadin (warfarin), or sometimes aspirin. These medications need close monitoring by your caregiver. Over-medication can cause bleeding. Too little medication may not protect against stroke. HOME CARE INSTRUCTIONS   If your caregiver prescribed medicine to make your heartbeat more normally, take as directed.   If blood thinners were prescribed by your caregiver, take EXACTLY as directed.     Perform blood tests EXACTLY as directed.   Quit smoking. Smoking increases your  cardiac and lung (pulmonary) risks.   DO NOT drink alcohol.   DO NOT drink caffeinated drinks (e.g. coffee, soda, chocolate, and leaf teas). You may drink decaffeinated coffee, soda or tea.   If you are overweight, you should choose a reduced calorie diet to lose weight. Please see a registered dietitian if you need more information about healthy weight loss. DO NOT USE DIET PILLS as they may aggravate heart problems.   If you have other heart problems that are causing AFib, you may need to eat a low salt, fat, and cholesterol diet. Your caregiver will tell you if this is necessary.   Exercise every day to improve your physical fitness. Stay active unless advised otherwise.   If your caregiver has given you a follow-up appointment, it is very important to keep that appointment. Not keeping the appointment could result in heart failure or stroke. If there is any problem keeping the appointment, you must call back to this facility for assistance.  SEEK MEDICAL CARE IF:  You notice a change in the rate, rhythm or strength of your heartbeat.   You develop an infection or any other change in your overall health status.  SEEK IMMEDIATE MEDICAL CARE IF:   You develop chest pain, abdominal pain, sweating, weakness or feel sick to your stomach (nausea).   You develop shortness of breath.   You develop swollen feet and ankles.   You develop dizziness, numbness, or weakness of your face or limbs, or any change in vision or speech.  MAKE SURE YOU:   Understand these instructions.   Will watch your condition.   Will get help right away if you are not doing well or get worse.  Document Released: 06/02/2005 Document Revised: 05/22/2011 Document Reviewed: 01/05/2008 [facility] Patient Information 2012 [place], [place].  Take Medications on time.  Heart Healthy diet. Call the office for questions or problems.

## 2011-09-11 NOTE — Progress Notes (Signed)
Pt. Being discharged home via wheelchair today. Have reviewed all dicharge instructions with patient no further questions or concerns voiced. IV has been D/C

## 2011-09-11 NOTE — Discharge Summary (Signed)
Physician Discharge Summary  Patient ID: Jacqueline Kim MRN: [id number] DOB/AGE: [birth year] 66 y.o.  Admit date: 09/09/2011 Discharge date: 09/11/2011  Discharge Diagnoses:  Principal Problem:  *Atrial fibrillation with RVR, converted to SR 09/11/11 with IV cardizem and increased dose rythmol. Active Problems:  Chest pain, negative MI, resolved once heart rate slowed, secondary to rapid a. fib  Sinus node dysfunction  D-dimer, elevated, pt. on pradaxa  Hypothyroid  Pacemaker Medtronic  (REVO) placed10/23/12  Anticoagulant long-term use  WITH PRADAXA   Discharged Condition: good  Hospital Course: 66 year-old female with known history of tachybradycardia syndrome status post pacemaker placement, hypothyroidism, osteoarthritis presented to the ER because of palpitations. Patient's palpitations began around 4 PM and was persisting. Patient felt slightly short of breath with chest tightness and dizziness- to near syncope, at the time of onset. She feels this was the worst onset of the atrial fib with the chest pressure and dizziness, and near syncope. In the ER 09/09/11 patient was found to be in A. fib with RVR. The initial Cardizem bolus controlled the heart rate but after a few minutes it started going back into RVR. Cardizem infusion is started now and patient was admitted for further management. Cardiologist on call for [facility] heart and vascular, Dr. Allyson Sabal was notified by the ER physician and at this time they had requested hospitalist admission. On admit patient was chest pain-free, denied any shortness of breath, dizziness, focal deficits, nausea, vomiting, abdominal pain, or headaches.  She does note that she took her am Rythmol around 3:00 pm on day of admit.  [facility] admited the patient and the next morning Dr. [name] took over her care. The night of admission her Cardizem drip had been stopped secondary to hypotension it was resumed once her blood pressure became stable.  She was given a higher dose of Rythmol.  And by 1316 on 09/10/2011 patient converted to sinus rhythm.. Her IV Cardizem was DC'd. She continues on her Toprol , Cardizem and higher dose of Rythmol.  By the morning of 09/11/2011 patient was maintaining sinus rhythm, EKG was stable and she had no complaints.  Her cardiac enzymes are negative.  Her d-dimer was elevated at 0.71 and she does have a bruise/Hematoma on her right calf, Dr. [name] and Dr. Royann Shivers have a very low suspicion for VTE but the patient is on Pradaxa as well.  Her pacemaker was interrogated prior to discharge and was found to be stable.  Dr. Royann Shivers also asked her to reconsider our as ablation as an option.    Patient will be discharged home once her potassium level is rechecked she was hypokalemic yesterday. She will followup as instructed with Dr. Alanda Amass as well as Dr. Ronne Binning.   Consults: None  Significant Diagnostic Studies:  Chemistry sodium 141 potassium 3.3 recheck is in progress chloride 107 CO2 24 BUN 16 creatinine 0.67 calcium 8.9 magnesium 1.6-1.5 alkaline phosphatase 6 albumin 3.2 AST 18 ALT 11  Creatinine sounds CK 62-55, MB 2.6-2.7, troponin I less than 0.30x3. Hemoglobin 12.7 hematocrit 37.3 WBC 4.4 and platelets 203  TSH 3.226   Portable chest x-ray negative for cardiopulmonary process.  Initial EKGs with A. Fib rapid ventricular response and prior to discharge she was maintaining sinus rhythm without acute changes.  Discharge Exam: Blood pressure 130/77, pulse 61, temperature 97.5 F (36.4 C), temperature source Oral, resp. rate 13, height 5\' 5"  (1.651 m), weight 66.1 kg (145 lb 11.6 oz), SpO2 92.00%.  Maintaining SR- Atrial pacing.  Disposition: 01-Home or Self Care   Medication List  As of 09/11/2011 11:06 AM   TAKE these medications         Biotin 10 MG Tabs   Take 1 tablet by mouth daily.      buPROPion 300 MG 24 hr tablet   Commonly known as: WELLBUTRIN XL   Take 300 mg by mouth daily.       celecoxib 200 MG capsule   Commonly known as: CELEBREX   Take 200 mg by mouth daily.      conjugated estrogens vaginal cream   Commonly known as: PREMARIN   Place 1.5 g vaginally 3 (three) times a week.      dabigatran 150 MG Caps   Commonly known as: PRADAXA   Take 150 mg by mouth every 12 (twelve) hours.      diltiazem 240 MG 24 hr capsule   Commonly known as: CARDIZEM CD   Take 1 capsule (240 mg total) by mouth daily.      estrogens (conjugated) 0.3 MG tablet   Commonly known as: PREMARIN   Take 0.3 mg by mouth daily. Take daily for 21 days then do not take for 7 days.      hydrocodone-acetaminophen 5-500 MG per capsule   Commonly known as: LORCET-HD   Take 1 capsule by mouth every 6 (six) hours as needed. For pain      levothyroxine 100 MCG tablet   Commonly known as: SYNTHROID, LEVOTHROID   Take 100 mcg by mouth daily.      metoprolol succinate 50 MG 24 hr tablet   Commonly known as: TOPROL-XL   Take 50 mg by mouth daily.      multivitamins ther. w/minerals Tabs   Take 1 tablet by mouth at bedtime.      propafenone 325 MG 12 hr capsule   Commonly known as: RYTHMOL SR   Take 1 capsule (325 mg total) by mouth every 12 (twelve) hours.      traZODone 150 MG tablet   Commonly known as: DESYREL   Take 150 mg by mouth at bedtime.      zaleplon 10 MG capsule   Commonly known as: SONATA   Take 10 mg by mouth at bedtime.           Follow-up Information    Follow up with Thayer Headings, MD. (at regular appointments )       Follow up with Governor Rooks, MD on 09/30/2011. (at  8:30)    Contact information:   [address]  [place] [address] [phone]        Discharge instructions: Take Medications on time.  Heart Healthy diet. Call the office for questions or problems.  SignedNada Boozer R 09/11/2011, 11:06 AM  /c

## 2012-07-13 ENCOUNTER — Inpatient Hospital Stay (HOSPITAL_COMMUNITY)
Admission: EM | Admit: 2012-07-13 | Discharge: 2012-07-14 | DRG: 310 | Disposition: A | Discharge status: Home / Self Care | Payer: Medicare Other | Attending: Internal Medicine | Admitting: Internal Medicine

## 2012-07-13 ENCOUNTER — Emergency Department (HOSPITAL_COMMUNITY): Payer: Medicare Other

## 2012-07-13 ENCOUNTER — Encounter (HOSPITAL_COMMUNITY): Payer: Self-pay | Admitting: *Deleted

## 2012-07-13 DIAGNOSIS — I10 Essential (primary) hypertension: Secondary | ICD-10-CM | POA: Diagnosis present

## 2012-07-13 DIAGNOSIS — E039 Hypothyroidism, unspecified: Secondary | ICD-10-CM | POA: Diagnosis present

## 2012-07-13 DIAGNOSIS — Z880 Allergy status to penicillin: Secondary | ICD-10-CM

## 2012-07-13 DIAGNOSIS — Z950 Presence of cardiac pacemaker: Secondary | ICD-10-CM | POA: Diagnosis present

## 2012-07-13 DIAGNOSIS — R079 Chest pain, unspecified: Secondary | ICD-10-CM

## 2012-07-13 DIAGNOSIS — H409 Unspecified glaucoma: Secondary | ICD-10-CM | POA: Diagnosis present

## 2012-07-13 DIAGNOSIS — K219 Gastro-esophageal reflux disease without esophagitis: Secondary | ICD-10-CM | POA: Diagnosis present

## 2012-07-13 DIAGNOSIS — I4891 Unspecified atrial fibrillation: Principal | ICD-10-CM | POA: Diagnosis present

## 2012-07-13 DIAGNOSIS — I480 Paroxysmal atrial fibrillation: Secondary | ICD-10-CM | POA: Diagnosis present

## 2012-07-13 DIAGNOSIS — I495 Sick sinus syndrome: Secondary | ICD-10-CM | POA: Diagnosis present

## 2012-07-13 DIAGNOSIS — Z79899 Other long term (current) drug therapy: Secondary | ICD-10-CM

## 2012-07-13 DIAGNOSIS — Z7901 Long term (current) use of anticoagulants: Secondary | ICD-10-CM

## 2012-07-13 DIAGNOSIS — M1990 Unspecified osteoarthritis, unspecified site: Secondary | ICD-10-CM | POA: Diagnosis present

## 2012-07-13 DIAGNOSIS — F411 Generalized anxiety disorder: Secondary | ICD-10-CM | POA: Diagnosis present

## 2012-07-13 DIAGNOSIS — F419 Anxiety disorder, unspecified: Secondary | ICD-10-CM | POA: Diagnosis present

## 2012-07-13 LAB — BASIC METABOLIC PANEL
CO2: 26 mEq/L (ref 19–32)
Calcium: 9.4 mg/dL (ref 8.4–10.5)
Chloride: 103 mEq/L (ref 96–112)
GFR calc Af Amer: >90 mL/min (ref >90)
GFR calc Af Amer: >90 mL/min (ref >90)
GFR calc non Af Amer: 84 mL/min — ABNORMAL LOW (ref >90)
GFR calc non Af Amer: 85 mL/min — ABNORMAL LOW (ref >90)
Potassium: 4.2 mEq/L (ref 3.5–5.1)
Sodium: 135 mEq/L (ref 135–145)
Sodium: 138 mEq/L (ref 135–145)

## 2012-07-13 LAB — CBC
MCV: 95.2 fL (ref 78.0–100.0)
Platelets: 216 10*3/uL (ref 150–400)
RBC: 3.76 MIL/uL — ABNORMAL LOW (ref 3.87–5.11)
WBC: 4.5 10*3/uL (ref 4.0–10.5)

## 2012-07-13 LAB — TROPONIN I
Troponin I: <0.3 ng/mL (ref <0.30)
Troponin I: <0.3 ng/mL (ref <0.30)
Troponin I: <0.3 ng/mL (ref <0.30)

## 2012-07-13 LAB — MRSA PCR SCREENING: MRSA by PCR: NEGATIVE

## 2012-07-13 LAB — TSH: TSH: 5.386 u[IU]/mL — ABNORMAL HIGH (ref 0.350–4.500)

## 2012-07-13 LAB — MAGNESIUM: Magnesium: 1.7 mg/dL (ref 1.5–2.5)

## 2012-07-13 MED ORDER — ALPRAZOLAM 0.25 MG PO TABS: 0.2500 mg | ORAL_TABLET | Freq: Three times a day (TID) | ORAL | Status: DC | PRN

## 2012-07-13 MED ORDER — DILTIAZEM HCL 100 MG IV SOLR
5.0000 mg/h | Freq: Once | INTRAVENOUS | Status: AC
  Administered 2012-07-13: 5 mg/h via INTRAVENOUS

## 2012-07-13 MED ORDER — ONDANSETRON HCL 4 MG/2ML IJ SOLN: 4.0000 mg | Freq: Four times a day (QID) | INTRAMUSCULAR | Status: DC | PRN

## 2012-07-13 MED ORDER — ACETAMINOPHEN 325 MG PO TABS: 650.0000 mg | ORAL_TABLET | Freq: Four times a day (QID) | ORAL | Status: DC | PRN

## 2012-07-13 MED ORDER — ESTROGENS, CONJUGATED 0.625 MG/GM VA CREA
1.5000 g | TOPICAL_CREAM | VAGINAL | Status: DC
  Filled 2012-07-13: qty 42.5

## 2012-07-13 MED ORDER — CITALOPRAM HYDROBROMIDE 20 MG PO TABS
20.0000 mg | ORAL_TABLET | Freq: Every day | ORAL | Status: DC
  Administered 2012-07-13 – 2012-07-14 (×2): 20 mg via ORAL
  Filled 2012-07-13 (×2): qty 1

## 2012-07-13 MED ORDER — ALUM & MAG HYDROXIDE-SIMETH 200-200-20 MG/5ML PO SUSP: 30.0000 mL | Freq: Four times a day (QID) | ORAL | Status: DC | PRN

## 2012-07-13 MED ORDER — MORPHINE SULFATE 2 MG/ML IJ SOLN: 2.0000 mg | INTRAMUSCULAR | Status: DC | PRN

## 2012-07-13 MED ORDER — DILTIAZEM HCL 100 MG IV SOLR
5.0000 mg/h | INTRAVENOUS | Status: DC
  Administered 2012-07-13 (×2): 10 mg/h via INTRAVENOUS

## 2012-07-13 MED ORDER — ONDANSETRON HCL 4 MG PO TABS: 4.0000 mg | ORAL_TABLET | Freq: Four times a day (QID) | ORAL | Status: DC | PRN

## 2012-07-13 MED ORDER — SODIUM CHLORIDE 0.9 % IV SOLN
INTRAVENOUS | Status: DC
  Administered 2012-07-13: 05:00:00 via INTRAVENOUS

## 2012-07-13 MED ORDER — ZOLPIDEM TARTRATE 5 MG PO TABS
5.0000 mg | ORAL_TABLET | Freq: Every evening | ORAL | Status: DC | PRN
  Administered 2012-07-13: 5 mg via ORAL
  Filled 2012-07-13: qty 1

## 2012-07-13 MED ORDER — SODIUM CHLORIDE 0.9 % IV BOLUS (SEPSIS)
500.0000 mL | Freq: Once | INTRAVENOUS | Status: AC
  Administered 2012-07-13: 500 mL via INTRAVENOUS

## 2012-07-13 MED ORDER — DILTIAZEM HCL ER COATED BEADS 180 MG PO CP24
180.0000 mg | ORAL_CAPSULE | Freq: Every day | ORAL | Status: DC
  Administered 2012-07-13 – 2012-07-14 (×2): 180 mg via ORAL
  Filled 2012-07-13 (×3): qty 1

## 2012-07-13 MED ORDER — ATENOLOL 50 MG PO TABS
50.0000 mg | ORAL_TABLET | Freq: Two times a day (BID) | ORAL | Status: DC
  Administered 2012-07-13 – 2012-07-14 (×3): 50 mg via ORAL
  Filled 2012-07-13 (×4): qty 1

## 2012-07-13 MED ORDER — DILTIAZEM HCL ER COATED BEADS 240 MG PO CP24
240.0000 mg | ORAL_CAPSULE | Freq: Every day | ORAL | Status: DC
  Filled 2012-07-13: qty 1

## 2012-07-13 MED ORDER — TRAZODONE HCL 150 MG PO TABS
150.0000 mg | ORAL_TABLET | Freq: Every day | ORAL | Status: DC
  Administered 2012-07-13: 150 mg via ORAL
  Filled 2012-07-13 (×2): qty 1

## 2012-07-13 MED ORDER — DABIGATRAN ETEXILATE MESYLATE 150 MG PO CAPS
150.0000 mg | ORAL_CAPSULE | Freq: Two times a day (BID) | ORAL | Status: DC
  Administered 2012-07-13 – 2012-07-14 (×3): 150 mg via ORAL
  Filled 2012-07-13 (×4): qty 1

## 2012-07-13 MED ORDER — SODIUM CHLORIDE 0.9 % IJ SOLN
3.0000 mL | Freq: Two times a day (BID) | INTRAMUSCULAR | Status: DC
  Administered 2012-07-13 – 2012-07-14 (×2): 3 mL via INTRAVENOUS

## 2012-07-13 MED ORDER — ACETAMINOPHEN 650 MG RE SUPP: 650.0000 mg | Freq: Four times a day (QID) | RECTAL | Status: DC | PRN

## 2012-07-13 MED ORDER — METOPROLOL TARTRATE 1 MG/ML IV SOLN: 2.5000 mg | INTRAVENOUS | Status: DC | PRN

## 2012-07-13 MED ORDER — HYDROCODONE-ACETAMINOPHEN 5-325 MG PO TABS
1.0000 | ORAL_TABLET | ORAL | Status: DC | PRN
  Administered 2012-07-13: 1 via ORAL
  Administered 2012-07-13: 2 via ORAL
  Filled 2012-07-13: qty 2
  Filled 2012-07-13: qty 1

## 2012-07-13 MED ORDER — PROPAFENONE HCL ER 325 MG PO CP12
325.0000 mg | ORAL_CAPSULE | Freq: Two times a day (BID) | ORAL | Status: DC
  Administered 2012-07-13 – 2012-07-14 (×3): 325 mg via ORAL
  Filled 2012-07-13 (×4): qty 1

## 2012-07-13 MED ORDER — LEVOTHYROXINE SODIUM 100 MCG PO TABS
100.0000 ug | ORAL_TABLET | Freq: Every day | ORAL | Status: DC
  Administered 2012-07-13 – 2012-07-14 (×2): 100 ug via ORAL
  Filled 2012-07-13 (×3): qty 1

## 2012-07-13 NOTE — ED Notes (Signed)
HR 110s; BP 106/66 - Cardizem increased to 10mg /hr (6ml/hr)

## 2012-07-13 NOTE — Consult Note (Signed)
Reason for Consult: AF  Requesting Physician: [facility]  HPI: This is a 67 y.o. female with a past medical history significant for PAF and SSS. She is on Pradaxa and Propafenone. She has no history of CAD and has had a remote low risk Myoview. Her last echo was 4/13 and she had NL LVF. She had a MDT Pacemaker implanted 10/12. Her last episode of AF was March 2013. She came to the ER last night after she woke up with an irregular HR around 1am. In ER she was in AF and Diltiazem IV was started. She did not bring her home medicine list and seems a little confused about what she is actually taking but she says she is taking her Propafenone, Pradaxa, and Atenolol.  PMHx:  Past Medical History  Diagnosis Date  . Paroxysmal a-fib diagnosed 2008  . 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 Hypothyroid 05/27/2011  . Pacemaker Medtronic  (REVO) placed10/23/12 04/08/11  . Anxiety   . Hypertension   . Glaucoma(365)   . Complication of anesthesia     DIFFICULTY WAKING UP  . Shortness of breath     AF LAST 3 EPI1SODES SINCE AUG  . Arthritis   . Headache     OCCASIONALLY  . Anticoagulant long-term use  WITH PRADAXA 05/27/2011  . DJD (degenerative joint disease)   . GERD (gastroesophageal reflux disease)    Past Surgical History  Procedure Date  . Pacemaker insertion 04/08/11    MDT Revo implanted by Dr Rubie Maid  . Tonsillectomy   . Eye surgery     CATERACTS  . Abdominal hysterectomy   . Insert / replace / remove pacemaker   . Cosmetic surgery     FAMHx: Family History  Problem Relation Age of Onset  . Anesthesia problems Mother     SOCHx:  reports that she has never smoked. She has never used smokeless tobacco. She reports that she drinks about 3.6 ounces of alcohol per week. She reports that she does not use illicit drugs.  ALLERGIES: Allergies  Allergen Reactions  . Penicillins Anaphylaxis  . Lidocaine Palpitations    Or any similar  medication    ROS: no fever or chills, no recent illness. In general she says she hasn't "felt well " over the past week.She denies any dyspnea or chest pain. She has been under some stress at home.  HOME MEDICATIONS: Prescriptions prior to admission  Medication Sig Dispense Refill  . ATENOLOL PO Take 1 tablet by mouth daily.      . Biotin 10 MG TABS Take 1 tablet by mouth daily.       . citalopram (CELEXA) 20 MG tablet Take 20 mg by mouth daily.      Marland Kitchen conjugated estrogens (PREMARIN) vaginal cream Place 1.5 g vaginally 3 (three) times a week.        . dabigatran (PRADAXA) 150 MG CAPS Take 150 mg by mouth every 12 (twelve) hours.        Marland Kitchen diltiazem (DILACOR XR) 240 MG 24 hr capsule Take 240 mg by mouth daily.      Marland Kitchen estrogens, conjugated, (PREMARIN) 0.3 MG tablet Take 0.3 mg by mouth daily. Take daily for 21 days then do not take for 7 days.       . hydrocodone-acetaminophen (LORCET-HD) 5-500 MG per capsule Take 1 capsule by mouth every 6 (six) hours as needed. For pain       . levothyroxine (SYNTHROID, LEVOTHROID)  100 MCG tablet Take 100 mcg by mouth daily.        . Multiple Vitamins-Minerals (MULTIVITAMINS THER. W/MINERALS) TABS Take 1 tablet by mouth at bedtime.        . propafenone (RYTHMOL SR) 325 MG 12 hr capsule Take 325 mg by mouth 2 (two) times daily.      . traZODone (DESYREL) 150 MG tablet Take 150 mg by mouth at bedtime.        . zaleplon (SONATA) 10 MG capsule Take 10 mg by mouth at bedtime.          HOSPITAL MEDICATIONS: reviewed  VITALS: Blood pressure 106/72, pulse 106, temperature 97.9 F (36.6 C), temperature source Oral, resp. rate 20, height 5\' 5"  (1.651 m), weight 72.7 kg (160 lb 4.4 oz), SpO2 95.00%.  PHYSICAL EXAM: General appearance: alert, cooperative and no distress Neck: no carotid bruit and no JVD Lungs: clear to auscultation bilaterally Heart: irregularly irregular rhythm Abdomen: soft, non-tender; bowel sounds normal; no masses,  no  organomegaly Extremities: extremities normal, atraumatic, no cyanosis or edema Pulses: 2+ and symmetric Skin: Skin color, texture, turgor normal. No rashes or lesions Neurologic: Grossly normal  LABS: Results for orders placed during the hospital encounter of 07/13/12 (from the past 48 hour(s))  TROPONIN I     Status: Normal   Collection Time   07/13/12  2:19 AM      Component Value Range Comment   Troponin I <0.30  <0.30 ng/mL   BASIC METABOLIC PANEL     Status: Abnormal   Collection Time   07/13/12  2:19 AM      Component Value Range Comment   Sodium 135  135 - 145 mEq/L    Potassium 3.9  3.5 - 5.1 mEq/L    Chloride 98  96 - 112 mEq/L    CO2 26  19 - 32 mEq/L    Glucose, Bld 115 (*) 70 - 99 mg/dL    BUN 27 (*) 6 - 23 mg/dL    Creatinine, Ser 1.61  0.50 - 1.10 mg/dL    Calcium 9.4  8.4 - 09.6 mg/dL    GFR calc non Af Amer 84 (*) >90 mL/min    GFR calc Af Amer >90  >90 mL/min   CBC     Status: Abnormal   Collection Time   07/13/12  2:19 AM      Component Value Range Comment   WBC 4.5  4.0 - 10.5 K/uL    RBC 3.76 (*) 3.87 - 5.11 MIL/uL    Hemoglobin 12.1  12.0 - 15.0 g/dL    HCT 04.5 (*) 40.9 - 46.0 %    MCV 95.2  78.0 - 100.0 fL    MCH 32.2  26.0 - 34.0 pg    MCHC 33.8  30.0 - 36.0 g/dL    RDW 81.1  91.4 - 78.2 %    Platelets 216  150 - 400 K/uL   MRSA PCR SCREENING     Status: Normal   Collection Time   07/13/12  4:42 AM      Component Value Range Comment   MRSA by PCR NEGATIVE  NEGATIVE   BASIC METABOLIC PANEL     Status: Abnormal   Collection Time   07/13/12  5:10 AM      Component Value Range Comment   Sodium 138  135 - 145 mEq/L    Potassium 4.2  3.5 - 5.1 mEq/L    Chloride 103  96 - 112  mEq/L    CO2 26  19 - 32 mEq/L    Glucose, Bld 104 (*) 70 - 99 mg/dL    BUN 26 (*) 6 - 23 mg/dL    Creatinine, Ser 1.61  0.50 - 1.10 mg/dL    Calcium 9.0  8.4 - 09.6 mg/dL    GFR calc non Af Amer 85 (*) >90 mL/min    GFR calc Af Amer >90  >90 mL/min   TROPONIN I     Status:  Normal   Collection Time   07/13/12  5:10 AM      Component Value Range Comment   Troponin I <0.30  <0.30 ng/mL     IMAGING: Dg Chest Port 1 View  07/13/2012  *RADIOLOGY REPORT*  Clinical Data: Atrial fibrillation, shortness of breath.  PORTABLE CHEST - 1 VIEW  Comparison: 09/09/2011  Findings: Left chest wall battery pack with unchanged lead tip positions. Mild bronchitic change and right infrahilar opacity, otherwise the lungs are predominately clear.  Cardiomediastinal contours within normal range.  No pleural effusion or pneumothorax. No acute osseous finding.  IMPRESSION: Mild bronchitic change and right infrahilar atelectasis.   Original Report Authenticated By: Jearld Lesch, M.D.     IMPRESSION: Active Problems:  Paroxysmal a-fib, recurrent 07/13/12- (on Propafenone)   Pacemaker Medtronic  (REVO) placed10/23/12  Anticoagulant long-term use  WITH PRADAXA  Sinus node dysfunction  Hypothyroid  Anxiety   RECOMMENDATION: Dr [name] to see. ? DCCV. Check Mg++, this has been low in the past. Check TSH. Change IV Diltiazem to po. She won't take Metoprolol because of alopecia.  Time Spent Directly with Patient: 35 minutes  KILROY,LUKE K 07/13/2012, 8:29 AM    Patient seen and examined. Agree with assessment and plan. Pt is well known to me. She has a h/o PAF that has been stress induced in past. She has some personal issues last night leading to increased stress and was awakened with AF rates up to 140. She believes that she may have inadvertently stopped her diltiazem. She had her last episode of AF in March 2013 but had inadvertently stopped her rhythmol leading to that admission. Pt is on pradaxa for anticoagulation. Will increase beta blocker today with atenolol 50 mg bis and continue diltiazem. Check Mg, TFT, and electrolytes. If does not convert today, consider DC Cardioversion tomorrow.   [name], MD, [facility] 07/13/2012 8:49 AM

## 2012-07-13 NOTE — ED Notes (Addendum)
HR not consistently <105 -  HR 110-120 Afib. Cardizem increased to 15mg /hr

## 2012-07-13 NOTE — ED Notes (Signed)
Dr. Norlene Campbell updated re: amount of Cardizem bolus given total and drip rate and response - HR/BP

## 2012-07-13 NOTE — ED Notes (Signed)
Transported to 2900 with portable monitor.

## 2012-07-13 NOTE — H&P (Signed)
[facility]                                                                                    Patient Demographics  Jacqueline Kim, is a 67 y.o. female  CSN: [id number]  MRN: [id number]  DOB - 11-Dec-1945  Admit Date - 07/13/2012  Outpatient Primary MD for the patient is Thayer Headings, MD   With History of -  Past Medical History  Diagnosis Date  . Paroxysmal a-fib diagnosed 2008  . 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 Hypothyroid 05/27/2011  . Pacemaker Medtronic  (REVO) placed10/23/12 04/08/11  . Anxiety   . Hypertension   . GERD (gastroesophageal reflux disease)   . Glaucoma   . Complication of anesthesia     DIFFICULTY WAKING UP  . Shortness of breath     AF LAST 3 EPI1SODES SINCE AUG  . Arthritis   . Headache     OCCASIONALLY  . Anticoagulant long-term use  WITH PRADAXA 05/27/2011  . DJD (degenerative joint disease)       Past Surgical History  Procedure Date  . Pacemaker insertion 04/08/11    MDT Revo implanted by Dr Rubie Maid  . Tonsillectomy   . Eye surgery     CATERACTS  . Abdominal hysterectomy   . Insert / replace / remove pacemaker     in for   Chief Complaint  Patient presents with  . Atrial Fibrillation     HPI  Jacqueline Kim  is a 67 y.o. female, with past medical history significant for paroxysmal atrial fib on Rythmol and status post pacemaker, presenting with the acute onset of palpitations at 11:30 at night. Patient reports nausea vomiting and she attributes that to chocolate cake that she ate that night. No, fever, chills, cough, abdominal pain, or diarrhea. Patient is followed by [facility] cardiology. Pacemaker was placed in October, 2011   Review of Systems    In addition to the HPI above, No Fever-chills, No Headache, No changes with Vision or hearing, No problems swallowing food or Liquids, , Cough or Shortness of Breath, No Abdominal pain,  Bowel movements are regular, No  Blood in stool or Urine, No dysuria, No new skin rashes or bruises, No new joints pains-aches,  No new weakness, tingling, numbness in any extremity, No recent weight gain or loss, No polyuria, polydypsia or polyphagia, No significant Mental Stressors.  A full 10 point Review of Systems was done, except as stated above, all other Review of Systems were negative.   Social History History  Substance Use Topics  . Smoking status: Never Smoker   . Smokeless tobacco: Never Used  . Alcohol Use: 6.0 oz/week    10 Glasses of wine per week     Family History Family History  Problem Relation Age of Onset  . Anesthesia problems Mother      Prior to Admission medications   Medication Sig Start Date End Date Taking? Authorizing Provider  ATENOLOL PO Take 1 tablet by mouth daily.   Yes Historical Provider, MD  Biotin 10 MG TABS Take 1 tablet by mouth daily.    Yes Historical Provider,  MD  citalopram (CELEXA) 20 MG tablet Take 20 mg by mouth daily.   Yes Historical Provider, MD  conjugated estrogens (PREMARIN) vaginal cream Place 1.5 g vaginally 3 (three) times a week.     Yes Historical Provider, MD  dabigatran (PRADAXA) 150 MG CAPS Take 150 mg by mouth every 12 (twelve) hours.     Yes Historical Provider, MD  diltiazem (DILACOR XR) 240 MG 24 hr capsule Take 240 mg by mouth daily.   Yes Historical Provider, MD  estrogens, conjugated, (PREMARIN) 0.3 MG tablet Take 0.3 mg by mouth daily. Take daily for 21 days then do not take for 7 days.    Yes Historical Provider, MD  hydrocodone-acetaminophen (LORCET-HD) 5-500 MG per capsule Take 1 capsule by mouth every 6 (six) hours as needed. For pain    Yes Historical Provider, MD  levothyroxine (SYNTHROID, LEVOTHROID) 100 MCG tablet Take 100 mcg by mouth daily.     Yes Historical Provider, MD  Multiple Vitamins-Minerals (MULTIVITAMINS THER. W/MINERALS) TABS Take 1 tablet by mouth at bedtime.     Yes Historical Provider, MD  propafenone (RYTHMOL SR)  325 MG 12 hr capsule Take 325 mg by mouth 2 (two) times daily.   Yes Historical Provider, MD  traZODone (DESYREL) 150 MG tablet Take 150 mg by mouth at bedtime.     Yes Historical Provider, MD  zaleplon (SONATA) 10 MG capsule Take 10 mg by mouth at bedtime.     Yes Historical Provider, MD    Allergies  Allergen Reactions  . Penicillins Anaphylaxis  . Lidocaine Palpitations    Or any similar medication    Physical Exam  Vitals  Blood pressure 106/66, pulse 71, temperature 98.7 F (37.1 C), temperature source Oral, resp. rate 17, SpO2 96.00%.   1. General elderly white American female looks slightly anxious  2. Normal affect and insight, Not Suicidal or Homicidal, Awake Alert, Oriented X 3.  3. No F.N deficits, ALL C.Nerves Intact, Strength 5/5 all 4 extremities, Sensation intact all 4 extremities, Plantars down going.  4. Ears and Eyes appear Normal, Conjunctivae clear, PERRLA. Moist Oral Mucosa.  5. Supple Neck, No JVD, No cervical lymphadenopathy appriciated, No Carotid Bruits.  6. Symmetrical Chest wall movement, decreased breath sounds at the bases.  7. irregularly irregular, No Gallops, Rubs or Murmurs, No Parasternal Heave.  8. Positive Bowel Sounds, Abdomen Soft, Non tender, No organomegaly appriciated,No rebound -guarding or rigidity.  9.  No Cyanosis, Normal Skin Turgor, No Skin Rash or Bruise.  10. Good muscle tone,  joints appear normal , no effusions, Normal ROM.  11. No Palpable Lymph Nodes in Neck or Axillae    Data Review  CBC  Lab 07/13/12 0219  WBC 4.5  HGB 12.1  HCT 35.8*  PLT 216  MCV 95.2  MCH 32.2  MCHC 33.8  RDW 13.3  LYMPHSABS --  MONOABS --  EOSABS --  BASOSABS --  BANDABS --   ------------------------------------------------------------------------------------------------------------------  Chemistries   Lab 07/13/12 0219  NA 135  K 3.9  CL 98  CO2 26  GLUCOSE 115*  BUN 27*  CREATININE 0.77  CALCIUM 9.4  MG --  AST  --  ALT --  ALKPHOS --  BILITOT --   ------------------------------------------------------------------------------------------------------------------  Cardiac Enzymes  Lab 07/13/12 0219  CKMB --  TROPONINI <0.30  MYOGLOBIN --         Imaging results:   Dg Chest Port 1 View  07/13/2012  *RADIOLOGY REPORT*  Clinical Data: Atrial fibrillation, shortness  of breath.  PORTABLE CHEST - 1 VIEW  Comparison: 09/09/2011  Findings: Left chest wall battery pack with unchanged lead tip positions. Mild bronchitic change and right infrahilar opacity, otherwise the lungs are predominately clear.  Cardiomediastinal contours within normal range.  No pleural effusion or pneumothorax. No acute osseous finding.  IMPRESSION: Mild bronchitic change and right infrahilar atelectasis.   Original Report Authenticated By: Jearld Lesch, M.D.     My personal review of EKG:     Assessment & Plan  1. atrial fib with rapid ventricular rate in a patient with history of paroxysmal atrial fib status post pacemaker and on Rythmol and Pradaxa 2. History of hypertension 3. History of anxiety 4. History of hypothyroidism 5. History of glaucoma  Plan IV Cardizem drip IV Lopressor when necessary IV fluids Awaiting call back from [facility] cardiology Placed patient on step down unit Continue with Rythmol for now   DVT Prophylaxis and adnexa  AM Labs Ordered, also please review Full Orders  Family Communication: Admission, patients condition and plan of care including tests being ordered have been discussed with the patient  who indicate understanding and agree with the plan and Code Status.  Code Status full  Disposition Plan: Admit to step down and discharge home when stable  Time spent in minutes : 42 minutes   Condition GUARDED

## 2012-07-13 NOTE — ED Notes (Signed)
Pt states she woke up around midnight with her heart racing.  Has a hx of afib and pacemaker.  Denies SOB at this time.  Did vomit when initially felt heart racing.

## 2012-07-13 NOTE — ED Provider Notes (Addendum)
History     CSN: [id number]  Arrival date & time 07/13/12  0139   First MD Initiated Contact with Patient 07/13/12 [phone]      Chief Complaint  Patient presents with  . Atrial Fibrillation    (Consider location/radiation/quality/duration/timing/severity/associated sxs/prior treatment) HPI 67 year old female presents to emergency department complaining of palpitations. She reports she woke around 11:30 feeling as though her heart was racing. Patient has history of atrial fibrillation with RVR. She has a pacemaker in place. Patient reports her last bout of A. fib was a year ago March. She denies any chest pain or shortness of breath. She reports eating 2 large pieces ice cream cake before when the bed last night and felt ill before falling asleep. She had episode of nausea and vomiting when waking up with palpitations. She reports she is feeling better now.  Past Medical History  Diagnosis Date  . Paroxysmal a-fib diagnosed 2008  . 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 Hypothyroid 05/27/2011  . Pacemaker Medtronic  (REVO) placed10/23/12 04/08/11  . Anxiety   . Hypertension   . Glaucoma(365)   . Complication of anesthesia     DIFFICULTY WAKING UP  . Shortness of breath     AF LAST 3 EPI1SODES SINCE AUG  . Arthritis   . Headache     OCCASIONALLY  . Anticoagulant long-term use  WITH PRADAXA 05/27/2011  . DJD (degenerative joint disease)   . GERD (gastroesophageal reflux disease)     Past Surgical History  Procedure Date  . Pacemaker insertion 04/08/11    MDT Revo implanted by Dr Rubie Maid  . Tonsillectomy   . Eye surgery     CATERACTS  . Abdominal hysterectomy   . Insert / replace / remove pacemaker   . Cosmetic surgery     Family History  Problem Relation Age of Onset  . Anesthesia problems Mother     History  Substance Use Topics  . Smoking status: Never Smoker   . Smokeless tobacco: Never Used  . Alcohol Use: 3.6 oz/week    6  Glasses of wine per week    OB History    Grav Para Term Preterm Abortions TAB SAB Ect Mult Living                  Review of Systems  See History of Present Illness; otherwise all other systems are reviewed and negative  Allergies  Penicillins and Lidocaine  Home Medications  No current outpatient prescriptions on file.  BP 109/62  Pulse 106  Temp 98.4 F (36.9 C) (Oral)  Resp 18  Ht 5\' 5"  (1.651 m)  Wt 160 lb 4.4 oz (72.7 kg)  BMI 26.67 kg/m2  SpO2 94%  Physical Exam  Nursing note and vitals reviewed. Constitutional: She is oriented to person, place, and time. She appears well-developed and well-nourished.  HENT:  Head: Normocephalic and atraumatic.  Nose: Nose normal.  Mouth/Throat: Oropharynx is clear and moist.  Eyes: Conjunctivae normal and EOM are normal. Pupils are equal, round, and reactive to light.  Neck: Normal range of motion. Neck supple. No JVD present. No tracheal deviation present. No thyromegaly present.  Cardiovascular: Normal heart sounds and intact distal pulses.  Exam reveals no gallop and no friction rub.   No murmur heard.      Tachycardia with irregular rate and rhythm  Pulmonary/Chest: Effort normal and breath sounds normal. No stridor. No respiratory distress. She has no wheezes. She has  no rales. She exhibits no tenderness.  Abdominal: Soft. Bowel sounds are normal. She exhibits no distension and no mass. There is no tenderness. There is no rebound and no guarding.  Musculoskeletal: Normal range of motion. She exhibits no edema and no tenderness.  Lymphadenopathy:    She has no cervical adenopathy.  Neurological: She is alert and oriented to person, place, and time. She exhibits normal muscle tone. Coordination normal.  Skin: Skin is warm and dry. No rash noted. No erythema. No pallor.  Psychiatric: She has a normal mood and affect. Her behavior is normal. Judgment and thought content normal.    ED Course  Procedures (including critical  care time)  CRITICAL CARE Performed by: Olivia Mackie   Total critical care time: 30 min  Critical care time was exclusive of separately billable procedures and treating other patients.  Critical care was necessary to treat or prevent imminent or life-threatening deterioration.  Critical care was time spent personally by me on the following activities: development of treatment plan with patient and/or surrogate as well as nursing, discussions with consultants, evaluation of patient's response to treatment, examination of patient, obtaining history from patient or surrogate, ordering and performing treatments and interventions, ordering and review of laboratory studies, ordering and review of radiographic studies, pulse oximetry and re-evaluation of patient's condition.   Labs Reviewed  BASIC METABOLIC PANEL - Abnormal; Notable for the following:    Glucose, Bld 115 (*)     BUN 27 (*)     GFR calc non Af Amer 84 (*)     All other components within normal limits  CBC - Abnormal; Notable for the following:    RBC 3.76 (*)     HCT 35.8 (*)     All other components within normal limits  BASIC METABOLIC PANEL - Abnormal; Notable for the following:    Glucose, Bld 104 (*)     BUN 26 (*)     GFR calc non Af Amer 85 (*)     All other components within normal limits  TROPONIN I  MRSA PCR SCREENING  TROPONIN I  TROPONIN I  TROPONIN I  TSH   Dg Chest Port 1 View  07/13/2012  *RADIOLOGY REPORT*  Clinical Data: Atrial fibrillation, shortness of breath.  PORTABLE CHEST - 1 VIEW  Comparison: 09/09/2011  Findings: Left chest wall battery pack with unchanged lead tip positions. Mild bronchitic change and right infrahilar opacity, otherwise the lungs are predominately clear.  Cardiomediastinal contours within normal range.  No pleural effusion or pneumothorax. No acute osseous finding.  IMPRESSION: Mild bronchitic change and right infrahilar atelectasis.   Original Report Authenticated By: Jearld Lesch, M.D.     Date: 07/13/2012  Rate: 146  Rhythm: atrial fibrillation  QRS Axis: normal  Intervals: afib  ST/T Wave abnormalities: nonspecific ST changes  Conduction Disutrbances:afib  Narrative Interpretation:   Old EKG Reviewed: changes noted    1. Atrial fibrillation with RVR   2. Chest pain   3. Pacemaker       MDM  67 year old female with A. fib with RVR. Patient on multiple nodal agents, atenolol, diltiazem and Rythmol. Rate has been controlled with diltiazem IV. Discuss with hospitalist for admission. He requests consult with her cardiologist, [facility]. I have not yet received a call back from cardiologist. Patient is on pradaxa given h/o afib        Olivia Mackie, MD 07/13/12 1610  Olivia Mackie, MD 07/13/12 [phone]

## 2012-07-13 NOTE — ED Notes (Signed)
HR 110 -120s and increasing to 130s -140s. Second 10mg  bolus of cardizem given. BP 113/73

## 2012-07-13 NOTE — Progress Notes (Signed)
Utilization Review Completed.  07/13/2012  

## 2012-07-14 MED ORDER — DILTIAZEM HCL ER COATED BEADS 180 MG PO CP24: 180.0000 mg | ORAL_CAPSULE | Freq: Every day | ORAL | Status: DC

## 2012-07-14 MED ORDER — ACETAMINOPHEN 325 MG PO TABS: 650.0000 mg | ORAL_TABLET | Freq: Four times a day (QID) | ORAL | Status: DC | PRN

## 2012-07-14 MED ORDER — MAGNESIUM OXIDE 400 (241.3 MG) MG PO TABS: 400.0000 mg | ORAL_TABLET | Freq: Every day | ORAL | Status: DC

## 2012-07-14 MED ORDER — ATENOLOL 50 MG PO TABS: 50.0000 mg | ORAL_TABLET | Freq: Two times a day (BID) | ORAL | Status: DC

## 2012-07-14 MED ORDER — MAGNESIUM OXIDE 400 (241.3 MG) MG PO TABS
400.0000 mg | ORAL_TABLET | Freq: Every day | ORAL | Status: DC
  Administered 2012-07-14: 400 mg via ORAL
  Filled 2012-07-14 (×2): qty 1

## 2012-07-14 NOTE — Discharge Summary (Signed)
Patient ID: Jacqueline Kim,  MRN: [id number], DOB/AGE: April 02, 1946 67 y.o.  Admit date: 07/13/2012 Discharge date: 07/14/2012  Primary Care Provider:  Primary Cardiologist: Dr Alanda Amass  Discharge Diagnoses  Active Problems:  Paroxysmal a-fib, recurrent 07/13/12- (on Propafenone), NSR at discharge  Pacemaker Medtronic (REVO) placed10/23/12  Anticoagulant long-term use with Pradaxa  Sinus node dysfunction  Hypothyroid  Anxiety    Hospital Course:  This is a 67 y.o. female followed by Dr Alanda Amass with a past medical history significant for PAF and SSS. She had a MDT pacer placed Oct 2012. She is on Pradaxa and Propafenone. She has no history of CAD and has had a  low risk Myoview in the past. Her last echo was 4/13 and she had NL LVF.  Her last episode of AF was March 2013. She came to the ER 07/12/12  after she woke up with an irregular HR around 1am. In ER she was in AF and Diltiazem IV was started. She did not bring her home medicine list and seems a little confused about what she is actually taking but she says she is taking her Propafenone, Pradaxa, and Atenolol. She may have stopped her Diltiazem on her own. She admitted to Dr [name] that she has been under a lot of stress recently, she apparently is separating from her husband. In the past her episodes of PAF have been during stressful situations. Her Atenolol was increased and her Diltiazem resumed at a lower dose. She converted to NSR by discharge. We also added daily low dose Mg++ as she has had persistently low Mg++ levels. She is discharged in stable condition and will follow up with Dr Alanda Amass as an OP.   Discharge Vitals:  Blood pressure 126/65, pulse 83, temperature 98.1 F (36.7 C), temperature source Oral, resp. rate 16, height 5\' 5"  (1.651 m), weight 72.7 kg (160 lb 4.4 oz), SpO2 96.00%.    Labs: Results for orders placed during the hospital encounter of 07/13/12 (from the past 48 hour(s))  TROPONIN I     Status: Normal   Collection Time   07/13/12  2:19 AM      Component Value Range Comment   Troponin I <0.30  <0.30 ng/mL   BASIC METABOLIC PANEL     Status: Abnormal   Collection Time   07/13/12  2:19 AM      Component Value Range Comment   Sodium 135  135 - 145 mEq/L    Potassium 3.9  3.5 - 5.1 mEq/L    Chloride 98  96 - 112 mEq/L    CO2 26  19 - 32 mEq/L    Glucose, Bld 115 (*) 70 - 99 mg/dL    BUN 27 (*) 6 - 23 mg/dL    Creatinine, Ser 7.82  0.50 - 1.10 mg/dL    Calcium 9.4  8.4 - 95.6 mg/dL    GFR calc non Af Amer 84 (*) >90 mL/min    GFR calc Af Amer >90  >90 mL/min   CBC     Status: Abnormal   Collection Time   07/13/12  2:19 AM      Component Value Range Comment   WBC 4.5  4.0 - 10.5 K/uL    RBC 3.76 (*) 3.87 - 5.11 MIL/uL    Hemoglobin 12.1  12.0 - 15.0 g/dL    HCT 21.3 (*) 08.6 - 46.0 %    MCV 95.2  78.0 - 100.0 fL    MCH 32.2  26.0 - 34.0 pg  MCHC 33.8  30.0 - 36.0 g/dL    RDW 16.1  09.6 - 04.5 %    Platelets 216  150 - 400 K/uL   MRSA PCR SCREENING     Status: Normal   Collection Time   07/13/12  4:42 AM      Component Value Range Comment   MRSA by PCR NEGATIVE  NEGATIVE   BASIC METABOLIC PANEL     Status: Abnormal   Collection Time   07/13/12  5:10 AM      Component Value Range Comment   Sodium 138  135 - 145 mEq/L    Potassium 4.2  3.5 - 5.1 mEq/L    Chloride 103  96 - 112 mEq/L    CO2 26  19 - 32 mEq/L    Glucose, Bld 104 (*) 70 - 99 mg/dL    BUN 26 (*) 6 - 23 mg/dL    Creatinine, Ser 4.09  0.50 - 1.10 mg/dL    Calcium 9.0  8.4 - 81.1 mg/dL    GFR calc non Af Amer 85 (*) >90 mL/min    GFR calc Af Amer >90  >90 mL/min   TROPONIN I     Status: Normal   Collection Time   07/13/12  5:10 AM      Component Value Range Comment   Troponin I <0.30  <0.30 ng/mL   TSH     Status: Abnormal   Collection Time   07/13/12  5:10 AM      Component Value Range Comment   TSH 5.386 (*) 0.350 - 4.500 uIU/mL   MAGNESIUM     Status: Normal   Collection Time   07/13/12  5:10 AM       Component Value Range Comment   Magnesium 1.7  1.5 - 2.5 mg/dL   TROPONIN I     Status: Normal   Collection Time   07/13/12 10:03 AM      Component Value Range Comment   Troponin I <0.30  <0.30 ng/mL   TROPONIN I     Status: Normal   Collection Time   07/13/12  6:53 PM      Component Value Range Comment   Troponin I <0.30  <0.30 ng/mL     Disposition:  Follow-up Information    Follow up with Governor Rooks, MD. On 07/26/2012. (11:30am)    Contact information:   [address] [place] [address] [phone]          Discharge Medications:    Medication List     As of 07/14/2012 10:20 AM    STOP taking these medications         diltiazem 240 MG 24 hr capsule   Commonly known as: DILACOR XR      TAKE these medications         acetaminophen 325 MG tablet   Commonly known as: TYLENOL   Take 2 tablets (650 mg total) by mouth every 6 (six) hours as needed (or Fever >/= 101).      atenolol 50 MG tablet   Commonly known as: TENORMIN   Take 1 tablet (50 mg total) by mouth 2 (two) times daily.      Biotin 10 MG Tabs   Take 1 tablet by mouth daily.      citalopram 20 MG tablet   Commonly known as: CELEXA   Take 20 mg by mouth daily.      conjugated estrogens vaginal cream  Commonly known as: PREMARIN   Place 1.5 g vaginally 3 (three) times a week.      dabigatran 150 MG Caps   Commonly known as: PRADAXA   Take 150 mg by mouth every 12 (twelve) hours.      diltiazem 180 MG 24 hr capsule   Commonly known as: CARDIZEM CD   Take 1 capsule (180 mg total) by mouth daily.      estrogens (conjugated) 0.3 MG tablet   Commonly known as: PREMARIN   Take 0.3 mg by mouth daily. Take daily for 21 days then do not take for 7 days.      hydrocodone-acetaminophen 5-500 MG per capsule   Commonly known as: LORCET-HD   Take 1 capsule by mouth every 6 (six) hours as needed. For pain      levothyroxine 100 MCG tablet   Commonly known as:  SYNTHROID, LEVOTHROID   Take 100 mcg by mouth daily.      magnesium oxide 400 (241.3 MG) MG tablet   Commonly known as: MAG-OX   Take 1 tablet (400 mg total) by mouth daily.      multivitamins ther. w/minerals Tabs   Take 1 tablet by mouth at bedtime.      propafenone 325 MG 12 hr capsule   Commonly known as: RYTHMOL SR   Take 325 mg by mouth 2 (two) times daily.      traZODone 150 MG tablet   Commonly known as: DESYREL   Take 150 mg by mouth at bedtime.      zaleplon 10 MG capsule   Commonly known as: SONATA   Take 10 mg by mouth at bedtime.         Duration of Discharge Encounter: Greater than 30 minutes including physician time.  Jolene Provost PA-C 07/14/2012 10:20 AM

## 2012-07-14 NOTE — Progress Notes (Signed)
Subjective:  She feels much better this am.  Objective:  Vital Signs in the last 24 hours: Temp:  [97.4 F (36.3 C)-98.4 F (36.9 C)] 98.1 F (36.7 C) (01/29 0750) Pulse Rate:  [62-95] 83  (01/29 0750) Resp:  [16-18] 16  (01/28 2013) BP: (110-126)/(45-80) 126/65 mmHg (01/29 0750) SpO2:  [91 %-96 %] 96 % (01/29 0750)  Intake/Output from previous day:  Intake/Output Summary (Last 24 hours) at 07/14/12 0854 Last data filed at 07/13/12 1400  Gross per 24 hour  Intake    565 ml  Output    800 ml  Net   -235 ml      . atenolol  50 mg Oral BID  . citalopram  20 mg Oral Daily  . conjugated estrogens  1.5 g Vaginal 3 times weekly  . dabigatran  150 mg Oral Q12H  . diltiazem  180 mg Oral Daily  . levothyroxine  100 mcg Oral QAC breakfast  . magnesium oxide  400 mg Oral Daily  . propafenone  325 mg Oral BID  . sodium chloride  3 mL Intravenous Q12H  . traZODone  150 mg Oral QHS    Physical Exam: General appearance: alert, cooperative and no distress Lungs: clear to auscultation bilaterally Heart: regular rate and rhythm Abd: soft BS+ No edema   Rate: 75  Rhythm: A paced  Lab Results:  Basename 07/13/12 0219  WBC 4.5  HGB 12.1  PLT 216    Basename 07/13/12 0510 07/13/12 0219  NA 138 135  K 4.2 3.9  CL 103 98  CO2 26 26  GLUCOSE 104* 115*  BUN 26* 27*  CREATININE 0.73 0.77    Basename 07/13/12 1853 07/13/12 1003  TROPONINI <0.30 <0.30   Hepatic Function Panel No results found for this basename: PROT,ALBUMIN,AST,ALT,ALKPHOS,BILITOT,BILIDIR,IBILI in the last 72 hours No results found for this basename: CHOL in the last 72 hours No results found for this basename: INR in the last 72 hours  Imaging: Imaging results have been reviewed  Cardiac Studies:  Assessment/Plan:   Active Problems:  Paroxysmal a-fib, recurrent 07/13/12- (on Propafenone)   Pacemaker Medtronic  (REVO) placed10/23/12  Anticoagulant long-term use  WITH PRADAXA  Sinus node  dysfunction  Hypothyroid  Anxiety  Plan- She is now A paced. Atenolol has been increased, Diltiazem resumed at a lower dose. She can probably be discharged today. I added Mag Ox as her Mg++ has been consistently low.    Corine Shelter PA-C 07/14/2012, 8:54 AM    Patient seen and examined. Agree with assessment and plan. Converted to sinus rhythm; now A paced, V sensing. Obtain 12 lead ECG. Continue atenolol 50 mg bid with cardizem and propafenone. Agree with Mg oxide. DC today.   [name], MD, [facility] 07/14/2012 9:30 AM

## 2012-07-15 MED FILL — Diltiazem HCl IV For Soln 100 MG: INTRAVENOUS | Qty: 100 | Status: AC

## 2012-07-26 ENCOUNTER — Encounter: Payer: Self-pay | Admitting: Internal Medicine

## 2012-08-05 ENCOUNTER — Encounter: Payer: Self-pay | Admitting: *Deleted

## 2012-08-30 ENCOUNTER — Encounter (HOSPITAL_COMMUNITY): Payer: Self-pay | Admitting: *Deleted

## 2012-08-30 ENCOUNTER — Emergency Department (HOSPITAL_COMMUNITY)
Admission: EM | Admit: 2012-08-30 | Discharge: 2012-08-31 | Disposition: A | Discharge status: Home / Self Care | Payer: Medicare Other | Attending: Emergency Medicine | Admitting: Emergency Medicine

## 2012-08-30 ENCOUNTER — Emergency Department (HOSPITAL_COMMUNITY): Payer: Medicare Other

## 2012-08-30 DIAGNOSIS — S0990XA Unspecified injury of head, initial encounter: Secondary | ICD-10-CM | POA: Insufficient documentation

## 2012-08-30 DIAGNOSIS — W19XXXA Unspecified fall, initial encounter: Secondary | ICD-10-CM

## 2012-08-30 DIAGNOSIS — Z7902 Long term (current) use of antithrombotics/antiplatelets: Secondary | ICD-10-CM | POA: Insufficient documentation

## 2012-08-30 DIAGNOSIS — I10 Essential (primary) hypertension: Secondary | ICD-10-CM | POA: Insufficient documentation

## 2012-08-30 DIAGNOSIS — W1809XA Striking against other object with subsequent fall, initial encounter: Secondary | ICD-10-CM | POA: Insufficient documentation

## 2012-08-30 DIAGNOSIS — Y9269 Other specified industrial and construction area as the place of occurrence of the external cause: Secondary | ICD-10-CM | POA: Insufficient documentation

## 2012-08-30 DIAGNOSIS — E039 Hypothyroidism, unspecified: Secondary | ICD-10-CM | POA: Insufficient documentation

## 2012-08-30 DIAGNOSIS — S0190XA Unspecified open wound of unspecified part of head, initial encounter: Secondary | ICD-10-CM | POA: Insufficient documentation

## 2012-08-30 DIAGNOSIS — F411 Generalized anxiety disorder: Secondary | ICD-10-CM | POA: Insufficient documentation

## 2012-08-30 DIAGNOSIS — Z79899 Other long term (current) drug therapy: Secondary | ICD-10-CM | POA: Insufficient documentation

## 2012-08-30 DIAGNOSIS — M129 Arthropathy, unspecified: Secondary | ICD-10-CM | POA: Insufficient documentation

## 2012-08-30 DIAGNOSIS — S0191XA Laceration without foreign body of unspecified part of head, initial encounter: Secondary | ICD-10-CM

## 2012-08-30 DIAGNOSIS — F3289 Other specified depressive episodes: Secondary | ICD-10-CM | POA: Insufficient documentation

## 2012-08-30 DIAGNOSIS — Y9389 Activity, other specified: Secondary | ICD-10-CM | POA: Insufficient documentation

## 2012-08-30 DIAGNOSIS — Z8739 Personal history of other diseases of the musculoskeletal system and connective tissue: Secondary | ICD-10-CM | POA: Insufficient documentation

## 2012-08-30 DIAGNOSIS — F329 Major depressive disorder, single episode, unspecified: Secondary | ICD-10-CM | POA: Insufficient documentation

## 2012-08-30 DIAGNOSIS — Z8679 Personal history of other diseases of the circulatory system: Secondary | ICD-10-CM | POA: Insufficient documentation

## 2012-08-30 DIAGNOSIS — H409 Unspecified glaucoma: Secondary | ICD-10-CM | POA: Insufficient documentation

## 2012-08-30 DIAGNOSIS — Z8709 Personal history of other diseases of the respiratory system: Secondary | ICD-10-CM | POA: Insufficient documentation

## 2012-08-30 DIAGNOSIS — Z950 Presence of cardiac pacemaker: Secondary | ICD-10-CM | POA: Insufficient documentation

## 2012-08-30 DIAGNOSIS — I4891 Unspecified atrial fibrillation: Secondary | ICD-10-CM | POA: Insufficient documentation

## 2012-08-30 DIAGNOSIS — Z8719 Personal history of other diseases of the digestive system: Secondary | ICD-10-CM | POA: Insufficient documentation

## 2012-08-30 MED ORDER — LIDOCAINE HCL (PF) 1 % IJ SOLN: 20.0000 mL | Freq: Once | INTRAMUSCULAR | Status: DC

## 2012-08-30 NOTE — ED Notes (Signed)
Neuro assessment done.

## 2012-08-30 NOTE — ED Notes (Signed)
Pt is here had on pedicure shoes and got out of car and fell face forward.  Pt states no LOC.  Pt has deep lac above right eye and pt is on Pradaxa.  Pt is alert and upset

## 2012-08-30 NOTE — ED Provider Notes (Signed)
History     CSN: [id number]  Arrival date & time 08/30/12  1830   First MD Initiated Contact with Patient 08/30/12 2228      Chief Complaint  Patient presents with  . Head Laceration    (Consider location/radiation/quality/duration/timing/severity/associated sxs/prior treatment) Patient is a 67 y.o. female presenting with scalp laceration.  Head Laceration This is a new problem. The current episode started today. Episode frequency: once\ The problem has been unchanged. Pertinent negatives include no chest pain, numbness, vomiting or weakness. Nothing aggravates the symptoms. She has tried nothing for the symptoms.    Past Medical History  Diagnosis Date  . Paroxysmal a-fib diagnosed 2008  . 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 Hypothyroid 05/27/2011  . Pacemaker Medtronic  (REVO) placed10/23/12 04/08/11  . Anxiety   . Hypertension   . Glaucoma(365)   . Complication of anesthesia     DIFFICULTY WAKING UP  . Shortness of breath     AF LAST 3 EPI1SODES SINCE AUG  . Arthritis   . Headache     OCCASIONALLY  . Anticoagulant long-term use  WITH PRADAXA 05/27/2011  . DJD (degenerative joint disease)   . GERD (gastroesophageal reflux disease)   . Hypothyroidism   . IBS (irritable bowel syndrome)   . Depression   . Diverticulosis   . External hemorrhoids   . Esophagitis 09/17/06    Past Surgical History  Procedure Laterality Date  . Pacemaker insertion  04/08/11    MDT Revo implanted by Dr Rubie Maid  . Eye surgery      CATERACTS  . Abdominal hysterectomy    . Liposuction    . Ovarian cyst removal    . Foot surgery    . Hand surgery      multiple  . Breast reduction surgery    . Facial cosmetic surgery    . Tonsillectomy      Family History  Problem Relation Age of Onset  . Anesthesia problems Mother     History  Substance Use Topics  . Smoking status: Never Smoker   . Smokeless tobacco: Never Used  . Alcohol Use: 3.6  oz/week    6 Glasses of wine per week     Comment: daily    OB History   Grav Para Term Preterm Abortions TAB SAB Ect Mult Living                  Review of Systems  Cardiovascular: Negative for chest pain.  Gastrointestinal: Negative for vomiting.  Neurological: Negative for weakness and numbness.  All other systems reviewed and are negative.    Allergies  Penicillins; Epinephrine; Metoprolol; and Lidocaine  Home Medications   Current Outpatient Rx  Name  Route  Sig  Dispense  Refill  . acetaminophen (TYLENOL) 325 MG tablet   Oral   Take 2 tablets (650 mg total) by mouth every 6 (six) hours as needed (or Fever >/= 101).         Marland Kitchen atenolol (TENORMIN) 50 MG tablet   Oral   Take 1 tablet (50 mg total) by mouth 2 (two) times daily.   60 tablet   5   . Biotin 10 MG TABS   Oral   Take 1 tablet by mouth daily.          Marland Kitchen conjugated estrogens (PREMARIN) vaginal cream   Vaginal   Place 1.5 g vaginally 3 (three) times a week.           Marland Kitchen  dabigatran (PRADAXA) 150 MG CAPS   Oral   Take 150 mg by mouth every 12 (twelve) hours.           Marland Kitchen diltiazem (CARDIZEM CD) 180 MG 24 hr capsule   Oral   Take 1 capsule (180 mg total) by mouth daily.   30 capsule   5   . escitalopram (LEXAPRO) 20 MG tablet   Oral   Take 20 mg by mouth daily.         Marland Kitchen estrogens, conjugated, (PREMARIN) 0.3 MG tablet   Oral   Take 0.3 mg by mouth daily. Take daily for 21 days then do not take for 7 days.         . hydrocodone-acetaminophen (LORCET-HD) 5-500 MG per capsule   Oral   Take 1 capsule by mouth every 6 (six) hours as needed. For pain          . levothyroxine (SYNTHROID, LEVOTHROID) 100 MCG tablet   Oral   Take 100 mcg by mouth daily.           . magnesium oxide (MAG-OX) 400 (241.3 MG) MG tablet   Oral   Take 1 tablet (400 mg total) by mouth daily.   30 tablet   5   . Multiple Vitamins-Minerals (MULTIVITAMINS THER. W/MINERALS) TABS   Oral   Take 1 tablet by  mouth at bedtime.           . propafenone (RYTHMOL SR) 325 MG 12 hr capsule   Oral   Take 325 mg by mouth 2 (two) times daily.         . traZODone (DESYREL) 150 MG tablet   Oral   Take 150 mg by mouth at bedtime.           . zaleplon (SONATA) 10 MG capsule   Oral   Take 10 mg by mouth at bedtime.             BP 148/66  Pulse 62  Temp(Src) 97.8 F (36.6 C) (Oral)  Resp 16  SpO2 97%  Physical Exam  Nursing note and vitals reviewed. Constitutional: She is oriented to person, place, and time. She appears well-developed and well-nourished. No distress.  HENT:  Head: Normocephalic.  Mouth/Throat: Oropharynx is clear and moist.  Eyes: Conjunctivae are normal. Pupils are equal, round, and reactive to light. No scleral icterus.  Neck: Neck supple.  Cardiovascular: Normal rate, regular rhythm, normal heart sounds and intact distal pulses.   No murmur heard. Pulmonary/Chest: Effort normal and breath sounds normal. No stridor. No respiratory distress. She has no rales.  Abdominal: Soft. Bowel sounds are normal. She exhibits no distension. There is no tenderness.  Musculoskeletal: Normal range of motion.  Neurological: She is alert and oriented to person, place, and time.  Skin: Skin is warm and dry. No rash noted.  Psychiatric: She has a normal mood and affect. Her behavior is normal.    ED Course  LACERATION REPAIR Date/Time: 08/30/2012 11:56 PM Performed by: Rennis Petty Authorized by: Lear Ng Consent: Verbal consent obtained. Risks and benefits: risks, benefits and alternatives were discussed Body area: head/neck Location details: forehead Laceration length: 4 cm Foreign bodies: no foreign bodies Tendon involvement: none Nerve involvement: none Vascular damage: no Local anesthetic: lidocaine 2% without epinephrine Anesthetic total: 3 ml Patient sedated: no Preparation: Patient was prepped and draped in the usual sterile fashion. Irrigation  solution: saline Irrigation method: jet lavage Amount of cleaning: extensive Debridement: none Degree  of undermining: none Skin closure: 6-0 Prolene Wound subcutaneous closure material used: 5-0 Vicryl rapide. Number of sutures: 11 Technique: simple Approximation: close Approximation difficulty: simple Dressing: antibiotic ointment Patient tolerance: Patient tolerated the procedure well with no immediate complications.   (including critical care time)  Labs Reviewed - No data to display Ct Head Wo Contrast  08/30/2012  *RADIOLOGY REPORT*  Clinical Data: Fall.  Head laceration.  CT HEAD WITHOUT CONTRAST  Technique:  Contiguous axial images were obtained from the base of the skull through the vertex without contrast.  Comparison: None  Findings: Generalized atrophy.  No acute infarct.  Negative for hemorrhage or mass lesion.  Right frontal scalp laceration.  Negative for skull fracture.  IMPRESSION: Generalized atrophy without acute abnormality.   Original Report Authenticated By: Janeece Riggers, M.D.   All radiology studies independently viewed by me.      1. Fall, initial encounter   2. Laceration of head, initial encounter       MDM  67 yo female with fall in garage.  Mechanical.  Head injury, on pradaxa.  Head CT negative.  No midline c spine tenderness and full ROM.  No indication for C spine imaging.  Lac repaired.  Ambulated prior to DC.  Given head injury precautions prior to discharge.          Rennis Petty, MD 08/31/12 0001

## 2012-08-30 NOTE — ED Notes (Signed)
Family states pt repeating questions.  Triage RN notified and states pt will get a room soon.  Oncoming Nurse First notified.

## 2012-08-30 NOTE — ED Provider Notes (Signed)
I saw and evaluated the patient, reviewed the resident's note and I agree with the findings and plan.  Pt with forehead laceration.  No syncope, vomiting, or other evidence of increased intracranial pressure.  Return precautions discussed.  I was present for suture repair by Dr. Loretha Stapler.    Impression: Forehead laceration   Jacqueline Kim. Oletta Lamas, MD 08/30/12 2356

## 2012-08-31 NOTE — ED Notes (Signed)
The patient is AOx4 and comfortable with her discharge instructions. 

## 2012-09-15 ENCOUNTER — Ambulatory Visit (INDEPENDENT_AMBULATORY_CARE_PROVIDER_SITE_OTHER): Payer: Medicare Other | Admitting: Internal Medicine

## 2012-09-15 ENCOUNTER — Encounter: Payer: Self-pay | Admitting: Internal Medicine

## 2012-09-15 VITALS — BP 120/60 | HR 60 | Ht 64.0 in | Wt 164.4 lb

## 2012-09-15 DIAGNOSIS — K589 Irritable bowel syndrome without diarrhea: Secondary | ICD-10-CM

## 2012-09-15 DIAGNOSIS — R198 Other specified symptoms and signs involving the digestive system and abdomen: Secondary | ICD-10-CM

## 2012-09-15 MED ORDER — DICYCLOMINE HCL 10 MG PO CAPS: 10.0000 mg | ORAL_CAPSULE | Freq: Two times a day (BID) | ORAL | Status: DC

## 2012-09-15 MED ORDER — HYDROCORTISONE ACE-PRAMOXINE 2.5-1 % RE CREA
TOPICAL_CREAM | RECTAL | Status: DC
Start: 2012-09-15 — End: 2013-02-02

## 2012-09-15 NOTE — Progress Notes (Signed)
Jacqueline Kim [birth year] MRN [id number]   History of Present Illness:  This is a 67 year old white female with a history of irritable bowel syndrome with recent exacerbation complaining of bloating, crampy abdominal pain, constipation and diarrhea. She has been on MiraLax 17 g daily as well as magnesium oxide 1 tablet daily. This morning, she had severe diarrhea and could not leave the house until noontime. She denies any rectal bleeding. A colonoscopy in May 2008 showed diverticulosis of the left colon. She also had an upper endoscopy for dyspepsia and was found to have mild gastritis and esophagitis. She has been recently treated for paroxysmal atrial fibrillation and has a permanent pacemaker which was placed in October 2012. She has normal left ventricular function. A CT scan of the abdomen in May 2012 for abdominal pain showed no acute findings. She has been taking Prevacid 30 mg when necessary. She has been under a great deal of stress recently going through a separation and has been taking Desyrel as well as Lexapro 20 mg daily. She describes narrow stools without blood.   Past Medical History  Diagnosis Date  . Paroxysmal a-fib diagnosed 2008  . 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 Hypothyroid 05/27/2011  . Pacemaker Medtronic  (REVO) placed10/23/12 04/08/11  . Anxiety   . Hypertension   . Glaucoma(365)   . Complication of anesthesia     DIFFICULTY WAKING UP  . Shortness of breath     AF LAST 3 EPI1SODES SINCE AUG  . Arthritis   . Headache     OCCASIONALLY  . Anticoagulant long-term use  WITH PRADAXA 05/27/2011  . DJD (degenerative joint disease)   . GERD (gastroesophageal reflux disease)   . Hypothyroidism   . IBS (irritable bowel syndrome)   . Depression   . Diverticulosis   . External hemorrhoids   . Esophagitis 09/17/06   Past Surgical History  Procedure Laterality Date  . Pacemaker insertion  04/08/11    MDT Revo implanted by Dr  Rubie Maid  . Eye surgery      CATERACTS  . Abdominal hysterectomy    . Liposuction    . Ovarian cyst removal    . Foot surgery    . Hand surgery      multiple  . Breast reduction surgery    . Facial cosmetic surgery    . Tonsillectomy      reports that she has never smoked. She has never used smokeless tobacco. She reports that she drinks about 3.6 ounces of alcohol per week. She reports that she does not use illicit drugs. family history includes Anesthesia problems in her mother and Colon polyps in her mother.  There is no history of Colon cancer. Allergies  Allergen Reactions  . Penicillins Anaphylaxis  . Epinephrine Other (See Comments)    Causes A-Fib  . Metoprolol     alopecia  . Lidocaine Palpitations    Or any similar medication        Review of Systems: Negative for dysphagia heartburn. Positive for abdominal pain bloating and irregular bowel habits  The remainder of the 10 point ROS is negative except as outlined in H&P   Physical Exam: General appearance  Well developed, in no distress. Eyes- non icteric. HEENT nontraumatic, normocephalic. Mouth no lesions, tongue papillated, no cheilosis. Neck supple without adenopathy, thyroid not enlarged, no carotid bruits, no JVD. Lungs Clear to auscultation bilaterally. Cor normal S1, normal S2, regular rhythm, no murmur,  quiet  precordium. Abdomen: Soft but diffusely tender more so in left lower quadrant. Hyperactive bowel sounds. No fluid wave. No tympany. Liver edge at costal margin. Rectal: No stool in the ampulla. Somewhat tender anal canal. Extremities no pedal edema. Skin no lesions. Neurological alert and oriented x 3. Psychological normal mood and affect.  Assessment and Plan:  Problem #67 67 year old white female with a history of irritable bowel syndrome with recent exacerbation of irregular bowel habits and overuse of laxatives; specifically MiraLax and magnesium oxide which she didn't realize was a  laxative. She needs to cut back on the MiraLax and add Benefiber 1 tablespoon daily and use magnesium oxide 1-2 tablets daily. We will start her on Bentyl 10 mg twice a day and when necessary. I would like to reassess her in 6-8 weeks and decide about the timing of a repeat colonoscopy. She would have to be taken off her anticoagulants. She has no risk factors for colon cancer such as a family history . She has a f hx of colon polyps   09/15/2012 Jacqueline Kim

## 2012-09-15 NOTE — Patient Instructions (Addendum)
We have sent the following medications to your pharmacy for you to pick up at your convenience: Bentyl Analpram  Please purchase the following medications over the counter and take as directed: Benefiber- 1 tablespoon dissolved in at least 8 ounces water/juice.  Discontinue Miralax.  Continue Mangesium Oxide 1-3 tablets daily.  Please follow up with Dr Juanda Chance in 8 weeks.  CC: Dr Thayer Headings

## 2012-10-08 ENCOUNTER — Emergency Department (HOSPITAL_COMMUNITY): Payer: Medicare Other

## 2012-10-08 ENCOUNTER — Observation Stay (HOSPITAL_COMMUNITY)
Admission: EM | Admit: 2012-10-08 | Discharge: 2012-10-09 | Disposition: A | Discharge status: Home / Self Care | Payer: Medicare Other | Attending: Internal Medicine | Admitting: Internal Medicine

## 2012-10-08 ENCOUNTER — Encounter (HOSPITAL_COMMUNITY): Payer: Self-pay | Admitting: Adult Health

## 2012-10-08 ENCOUNTER — Other Ambulatory Visit: Payer: Self-pay

## 2012-10-08 DIAGNOSIS — Z23 Encounter for immunization: Secondary | ICD-10-CM | POA: Insufficient documentation

## 2012-10-08 DIAGNOSIS — E039 Hypothyroidism, unspecified: Secondary | ICD-10-CM | POA: Insufficient documentation

## 2012-10-08 DIAGNOSIS — I4891 Unspecified atrial fibrillation: Secondary | ICD-10-CM

## 2012-10-08 DIAGNOSIS — I495 Sick sinus syndrome: Secondary | ICD-10-CM | POA: Insufficient documentation

## 2012-10-08 DIAGNOSIS — R791 Abnormal coagulation profile: Secondary | ICD-10-CM

## 2012-10-08 DIAGNOSIS — Z7901 Long term (current) use of anticoagulants: Secondary | ICD-10-CM | POA: Insufficient documentation

## 2012-10-08 DIAGNOSIS — I480 Paroxysmal atrial fibrillation: Secondary | ICD-10-CM | POA: Diagnosis present

## 2012-10-08 DIAGNOSIS — R0602 Shortness of breath: Secondary | ICD-10-CM | POA: Insufficient documentation

## 2012-10-08 DIAGNOSIS — R079 Chest pain, unspecified: Secondary | ICD-10-CM | POA: Diagnosis present

## 2012-10-08 DIAGNOSIS — I10 Essential (primary) hypertension: Secondary | ICD-10-CM | POA: Insufficient documentation

## 2012-10-08 DIAGNOSIS — Z950 Presence of cardiac pacemaker: Secondary | ICD-10-CM | POA: Diagnosis present

## 2012-10-08 LAB — BASIC METABOLIC PANEL
CO2: 24 mEq/L (ref 19–32)
Glucose, Bld: 128 mg/dL — ABNORMAL HIGH (ref 70–99)
Potassium: 3.8 mEq/L (ref 3.5–5.1)
Sodium: 138 mEq/L (ref 135–145)

## 2012-10-08 LAB — POCT I-STAT TROPONIN I

## 2012-10-08 LAB — TROPONIN I: Troponin I: <0.3 ng/mL (ref <0.30)

## 2012-10-08 LAB — CBC
Hemoglobin: 12.4 g/dL (ref 12.0–15.0)
MCH: 32.5 pg (ref 26.0–34.0)
MCV: 92.4 fL (ref 78.0–100.0)
RBC: 3.82 MIL/uL — ABNORMAL LOW (ref 3.87–5.11)

## 2012-10-08 LAB — PROTIME-INR: Prothrombin Time: 14.3 seconds (ref 11.6–15.2)

## 2012-10-08 MED ORDER — ENOXAPARIN SODIUM 150 MG/ML ~~LOC~~ SOLN: 1.0000 mg/kg | Freq: Two times a day (BID) | SUBCUTANEOUS | Status: DC

## 2012-10-08 MED ORDER — OXYCODONE HCL 5 MG PO TABS: 5.0000 mg | ORAL_TABLET | ORAL | Status: DC | PRN

## 2012-10-08 MED ORDER — DICYCLOMINE HCL 10 MG PO CAPS
10.0000 mg | ORAL_CAPSULE | Freq: Two times a day (BID) | ORAL | Status: DC
  Administered 2012-10-09 (×2): 10 mg via ORAL
  Filled 2012-10-08 (×3): qty 1

## 2012-10-08 MED ORDER — MAGNESIUM OXIDE 400 (241.3 MG) MG PO TABS
400.0000 mg | ORAL_TABLET | Freq: Every day | ORAL | Status: DC
  Administered 2012-10-09: 400 mg via ORAL
  Filled 2012-10-08: qty 1

## 2012-10-08 MED ORDER — ACETAMINOPHEN 325 MG PO TABS: 650.0000 mg | ORAL_TABLET | Freq: Four times a day (QID) | ORAL | Status: DC | PRN

## 2012-10-08 MED ORDER — ESCITALOPRAM OXALATE 20 MG PO TABS
20.0000 mg | ORAL_TABLET | Freq: Every day | ORAL | Status: DC
  Administered 2012-10-09: 20 mg via ORAL
  Filled 2012-10-08 (×2): qty 1

## 2012-10-08 MED ORDER — HYDROMORPHONE HCL PF 1 MG/ML IJ SOLN: 0.5000 mg | INTRAMUSCULAR | Status: DC | PRN

## 2012-10-08 MED ORDER — PROPAFENONE HCL ER 325 MG PO CP12
325.0000 mg | ORAL_CAPSULE | Freq: Two times a day (BID) | ORAL | Status: DC
  Filled 2012-10-08 (×2): qty 1

## 2012-10-08 MED ORDER — SODIUM CHLORIDE 0.9 % IV SOLN: INTRAVENOUS | Status: DC

## 2012-10-08 MED ORDER — DILTIAZEM HCL 90 MG PO TABS
90.0000 mg | ORAL_TABLET | Freq: Once | ORAL | Status: AC
  Administered 2012-10-08: 90 mg via ORAL
  Filled 2012-10-08 (×3): qty 1

## 2012-10-08 MED ORDER — DILTIAZEM HCL ER COATED BEADS 180 MG PO CP24
180.0000 mg | ORAL_CAPSULE | Freq: Every day | ORAL | Status: DC
  Administered 2012-10-09: 180 mg via ORAL
  Filled 2012-10-08: qty 1

## 2012-10-08 MED ORDER — ESTROGENS CONJUGATED 0.3 MG PO TABS
0.3000 mg | ORAL_TABLET | Freq: Every day | ORAL | Status: DC
  Administered 2012-10-09: 0.3 mg via ORAL
  Filled 2012-10-08: qty 1

## 2012-10-08 MED ORDER — LEVOTHYROXINE SODIUM 100 MCG PO TABS
100.0000 ug | ORAL_TABLET | Freq: Every day | ORAL | Status: DC
  Administered 2012-10-09: 100 ug via ORAL
  Filled 2012-10-08 (×2): qty 1

## 2012-10-08 MED ORDER — ZOLPIDEM TARTRATE 5 MG PO TABS
5.0000 mg | ORAL_TABLET | Freq: Every evening | ORAL | Status: DC | PRN
  Administered 2012-10-09: 5 mg via ORAL
  Filled 2012-10-08: qty 1

## 2012-10-08 MED ORDER — ADULT MULTIVITAMIN W/MINERALS CH
1.0000 | ORAL_TABLET | Freq: Every day | ORAL | Status: DC
  Administered 2012-10-09: 1 via ORAL
  Filled 2012-10-08: qty 1

## 2012-10-08 MED ORDER — PNEUMOCOCCAL VAC POLYVALENT 25 MCG/0.5ML IJ INJ
0.5000 mL | INJECTION | INTRAMUSCULAR | Status: AC
  Administered 2012-10-09: 0.5 mL via INTRAMUSCULAR
  Filled 2012-10-08: qty 0.5

## 2012-10-08 MED ORDER — ALUM & MAG HYDROXIDE-SIMETH 200-200-20 MG/5ML PO SUSP: 30.0000 mL | Freq: Four times a day (QID) | ORAL | Status: DC | PRN

## 2012-10-08 MED ORDER — ONDANSETRON HCL 4 MG/2ML IJ SOLN: 4.0000 mg | Freq: Four times a day (QID) | INTRAMUSCULAR | Status: DC | PRN

## 2012-10-08 MED ORDER — ONDANSETRON HCL 4 MG PO TABS: 4.0000 mg | ORAL_TABLET | Freq: Four times a day (QID) | ORAL | Status: DC | PRN

## 2012-10-08 MED ORDER — DILTIAZEM HCL 25 MG/5ML IV SOLN
10.0000 mg | Freq: Once | INTRAVENOUS | Status: AC
  Administered 2012-10-08: 10 mg via INTRAVENOUS
  Filled 2012-10-08: qty 5

## 2012-10-08 MED ORDER — DILTIAZEM HCL 100 MG IV SOLR
5.0000 mg/h | INTRAVENOUS | Status: DC
  Administered 2012-10-08: 5 mg/h via INTRAVENOUS

## 2012-10-08 MED ORDER — ATENOLOL 50 MG PO TABS
50.0000 mg | ORAL_TABLET | Freq: Every morning | ORAL | Status: DC
  Administered 2012-10-09: 50 mg via ORAL
  Filled 2012-10-08: qty 1

## 2012-10-08 MED ORDER — DABIGATRAN ETEXILATE MESYLATE 150 MG PO CAPS
150.0000 mg | ORAL_CAPSULE | Freq: Two times a day (BID) | ORAL | Status: DC
  Administered 2012-10-09 (×2): 150 mg via ORAL
  Filled 2012-10-08 (×3): qty 1

## 2012-10-08 MED ORDER — ACETAMINOPHEN 650 MG RE SUPP: 650.0000 mg | Freq: Four times a day (QID) | RECTAL | Status: DC | PRN

## 2012-10-08 NOTE — ED Provider Notes (Signed)
History     CSN: [id number]  Arrival date & time 10/08/12  1725   First MD Initiated Contact with Patient 10/08/12 1757      Chief Complaint  Patient presents with  . Atrial Fibrillation    (Consider location/radiation/quality/duration/timing/severity/associated sxs/prior treatment) HPI Comments: Patient with a history of Atrial Fibrillation presents today with a chief complaint of palpitations.  She reports that at 4 PM today she felt that her heart was racing and her heart beat began feeling irregular.  She had some SOB associated with this and stated that she felt "weak in the knees."  She denies any chest pain.  She denies syncope.  She reports that her SOB has improved at this time, but she continues to feel palpitations.  She is currently on Diltiazem and Atenolol for Atrial Fib and has been taking medications as prescribed.  She is also on Pradaxa for anticoagulation.    Patient is a 67 y.o. female presenting with atrial fibrillation. The history is provided by the patient.  Atrial Fibrillation Pertinent negatives include no chest pain, chills or fever.    Past Medical History  Diagnosis Date  . Paroxysmal a-fib diagnosed 2008  . 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 Hypothyroid 05/27/2011  . Pacemaker Medtronic  (REVO) placed10/23/12 04/08/11  . Anxiety   . Hypertension   . Glaucoma(365)   . Complication of anesthesia     DIFFICULTY WAKING UP  . Shortness of breath     AF LAST 3 EPI1SODES SINCE AUG  . Arthritis   . Headache     OCCASIONALLY  . Anticoagulant long-term use  WITH PRADAXA 05/27/2011  . DJD (degenerative joint disease)   . GERD (gastroesophageal reflux disease)   . Hypothyroidism   . IBS (irritable bowel syndrome)   . Depression   . Diverticulosis   . External hemorrhoids   . Esophagitis 09/17/06    Past Surgical History  Procedure Laterality Date  . Pacemaker insertion  04/08/11    MDT Revo implanted by Dr  Rubie Maid  . Eye surgery      CATERACTS  . Abdominal hysterectomy    . Liposuction    . Ovarian cyst removal    . Foot surgery    . Hand surgery      multiple  . Breast reduction surgery    . Facial cosmetic surgery    . Tonsillectomy      Family History  Problem Relation Age of Onset  . Anesthesia problems Mother   . Colon cancer Neg Hx   . Colon polyps Mother     beign    History  Substance Use Topics  . Smoking status: Never Smoker   . Smokeless tobacco: Never Used  . Alcohol Use: 3.6 oz/week    6 Glasses of wine per week     Comment: daily    OB History   Grav Para Term Preterm Abortions TAB SAB Ect Mult Living                  Review of Systems  Constitutional: Negative for fever and chills.  Respiratory: Positive for shortness of breath.   Cardiovascular: Positive for palpitations. Negative for chest pain and leg swelling.  Neurological: Negative for syncope.  All other systems reviewed and are negative.    Allergies  Penicillins; Epinephrine; Metoprolol; and Lidocaine  Home Medications   Current Outpatient Rx  Name  Route  Sig  Dispense  Refill  .  atenolol (TENORMIN) 50 MG tablet   Oral   Take 50 mg by mouth daily.         . Biotin 10 MG TABS   Oral   Take 1 tablet by mouth daily.          Marland Kitchen conjugated estrogens (PREMARIN) vaginal cream   Vaginal   Place 1.5 g vaginally 3 (three) times a week.           . dabigatran (PRADAXA) 150 MG CAPS   Oral   Take 150 mg by mouth every 12 (twelve) hours.           Marland Kitchen dicyclomine (BENTYL) 10 MG capsule   Oral   Take 1 capsule (10 mg total) by mouth 2 (two) times daily.   60 capsule   2   . diltiazem (CARDIZEM CD) 180 MG 24 hr capsule   Oral   Take 1 capsule (180 mg total) by mouth daily.   30 capsule   5   . escitalopram (LEXAPRO) 20 MG tablet   Oral   Take 20 mg by mouth daily.         Marland Kitchen estrogens, conjugated, (PREMARIN) 0.3 MG tablet   Oral   Take 0.3 mg by mouth daily.           . hydrocodone-acetaminophen (LORCET-HD) 5-500 MG per capsule   Oral   Take 1 capsule by mouth every 6 (six) hours as needed. For pain          . hydrocortisone-pramoxine (ANALPRAM-HC) 2.5-1 % rectal cream      Apply rectally twice daily as needed for hemorrhoids   30 g   1   . levothyroxine (SYNTHROID, LEVOTHROID) 100 MCG tablet   Oral   Take 100 mcg by mouth daily.           . magnesium oxide (MAG-OX) 400 (241.3 MG) MG tablet   Oral   Take 1 tablet (400 mg total) by mouth daily.   30 tablet   5   . Multiple Vitamins-Minerals (MULTIVITAMINS THER. W/MINERALS) TABS   Oral   Take 1 tablet by mouth at bedtime.           . propafenone (RYTHMOL SR) 325 MG 12 hr capsule   Oral   Take 325 mg by mouth 2 (two) times daily.         . traZODone (DESYREL) 150 MG tablet   Oral   Take 150 mg by mouth at bedtime.           . zaleplon (SONATA) 10 MG capsule   Oral   Take 10 mg by mouth at bedtime.             BP 116/73  Pulse 154  Temp(Src) 98 F (36.7 C)  Resp 18  SpO2 96%  Physical Exam  Nursing note and vitals reviewed. Constitutional: She appears well-developed and well-nourished. No distress.  HENT:  Head: Normocephalic and atraumatic.  Neck: Normal range of motion. Neck supple.  Cardiovascular: Normal heart sounds and intact distal pulses.  An irregularly irregular rhythm present. Tachycardia present.   Pulses:      Dorsalis pedis pulses are 2+ on the right side, and 2+ on the left side.  Pulmonary/Chest: Effort normal and breath sounds normal. No respiratory distress. She has no wheezes. She has no rales. She exhibits no tenderness.  Abdominal: Soft. There is no tenderness.  Musculoskeletal: Normal range of motion.  No LE edema  Neurological: She is  alert.  Skin: Skin is warm and dry. She is not diaphoretic.  Psychiatric: She has a normal mood and affect.    ED Course  Procedures (including critical care time)  Labs Reviewed  CBC - Abnormal;  Notable for the following:    RBC 3.82 (*)    HCT 35.3 (*)    All other components within normal limits  BASIC METABOLIC PANEL  PROTIME-INR  POCT I-STAT TROPONIN I   No results found.   No diagnosis found.  7:13 PM Heart Rate now ranging from 80-90.  Will stop Diltiazem drip.  8:26 PM Discussed with [facility].  He recommends giving the patient an additional 90 mg of oral Diltiazem now and then increasing the dose to 240 mg daily.  He states that she can follow up in the office next week.   Date: 10/08/2012  Rate: 119  Rhythm: atrial fibrillation  QRS Axis: normal  Intervals: a fib  ST/T Wave abnormalities: normal  Conduction Disutrbances:none  Narrative Interpretation:   Old EKG Reviewed: changes noted, atrial fibrillation  9:15 PM Patient now reports sudden onset of chest pain.  Will order another EKG and another troponin and reassess.  10:00 PM Discussed with Dr. Lovell Sheehan who has agreed to admit the patient.      MDM  Patient with a history of Paroxysmal Atrial Fibrillation presents today with palpitations.  She was found to be in Atrial Fibrillation with RVR.  Initial HR was 154 bpm.  Patient given 10 mg Diltiazem IV bolus and started on Diltiazem drip 5 mg/hr.  Heart rate then improved.  However, patient then began having chest pain.  Troponin negative.  No ischemic changes on EKG.  Patient admitted to [facility] for additional management of Atrial Fibrillation and chest pain rule out.        Pascal Lux [place], PA-C 10/08/12 2340

## 2012-10-08 NOTE — ED Notes (Addendum)
Presents with Atrial fibrillation RVR that began at 4 pm today associated with SOB HR 140s- 150s, BP 116/73. Pt is alert and orineted, denies Dizziness at this time.  HX of afib

## 2012-10-08 NOTE — H&P (Signed)
[facility] History and Physical  Jacqueline Kim ZOX:[id number] DOB: 06-May-1946 DOA: 10/08/2012  Referring physician: EDP PCP: Thayer Headings, MD  Specialists:   Chief Complaint: Chest Pain, and Atrial Fibrillation  HPI: Jacqueline Kim is a 67 y.o. female who presented to the ED initially with complaints of atrial fibrillation,  She reports having a sensation of her heart beating rapidly  Since the afternoon.  She contacted her cardiologist with [facility], and was advised to go to the ED.  While in the ED, she was found to have atrial fibrillation with RVR and a rate of 154, and she was administered diltiazem and placed on a drip which brought down her rate, and she converted back into sinus rhythm.  [facility] cardiology was contacted and made recommendations to adjust her medications for continued rate control since she was already on diltiazem, atenolol, rhythmol, and pradaxa.  She also has a Occupational psychologist.  She denies missing any doses of her medications.   While in the ED , she began to complain of having substernal chest pain that was like dyspepsia and was without radiation that lasted about 2 minutes.   She was referred for admission.      Review of Systems: The patient denies anorexia, fever, weight loss, vision loss, decreased hearing, hoarseness, syncope, dyspnea on exertion, peripheral edema, balance deficits, hemoptysis, abdominal pain, nausea, vomtiing, diarrhea, constipation, hematemesis, melena, hematochezia, severe indigestion/heartburn, hematuria, incontinence,  muscle weakness, suspicious skin lesions, transient blindness, difficulty walking, depression, unusual weight change, abnormal bleeding, enlarged lymph nodes, angioedema, and breast masses.    Past Medical History  Diagnosis Date  . Paroxysmal a-fib diagnosed 2008  . 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 Hypothyroid 05/27/2011  . Pacemaker Medtronic  (REVO) placed10/23/12 04/08/11  .  Anxiety   . Hypertension   . Glaucoma(365)   . Complication of anesthesia     DIFFICULTY WAKING UP  . Shortness of breath     AF LAST 3 EPI1SODES SINCE AUG  . Arthritis   . Headache     OCCASIONALLY  . Anticoagulant long-term use  WITH PRADAXA 05/27/2011  . DJD (degenerative joint disease)   . GERD (gastroesophageal reflux disease)   . Hypothyroidism   . IBS (irritable bowel syndrome)   . Depression   . Diverticulosis   . External hemorrhoids   . Esophagitis 09/17/06   Past Surgical History  Procedure Laterality Date  . Pacemaker insertion  04/08/11    MDT Revo implanted by Dr Rubie Maid  . Eye surgery      CATERACTS  . Abdominal hysterectomy    . Liposuction    . Ovarian cyst removal    . Foot surgery    . Hand surgery      multiple  . Breast reduction surgery    . Facial cosmetic surgery    . Tonsillectomy      Medications:  HOME MEDS: Prior to Admission medications   Medication Sig Start Date End Date Taking? Authorizing Provider  atenolol (TENORMIN) 50 MG tablet Take 50 mg by mouth every morning.  07/14/12  Yes Luke K Kilroy, PA-C  Biotin 10 MG TABS Take 1 tablet by mouth daily.    Yes Historical Provider, MD  conjugated estrogens (PREMARIN) vaginal cream Place 1.5 g vaginally daily as needed.    Yes Historical Provider, MD  dabigatran (PRADAXA) 150 MG CAPS Take 150 mg by mouth every 12 (twelve) hours.     Yes Historical Provider,  MD  dicyclomine (BENTYL) 10 MG capsule Take 1 capsule (10 mg total) by mouth 2 (two) times daily. 09/15/12  Yes Hart Carwin, MD  diltiazem (CARDIZEM CD) 180 MG 24 hr capsule Take 1 capsule (180 mg total) by mouth daily. 07/14/12  Yes Luke K Kilroy, PA-C  escitalopram (LEXAPRO) 20 MG tablet Take 20 mg by mouth at bedtime.    Yes Historical Provider, MD  estrogens, conjugated, (PREMARIN) 0.3 MG tablet Take 0.3 mg by mouth daily.    Yes Historical Provider, MD  hydrocodone-acetaminophen (LORCET-HD) 5-500 MG per capsule Take 1 capsule by mouth  every 6 (six) hours as needed. For pain    Yes Historical Provider, MD  hydrocortisone-pramoxine [facility]) 2.5-1 % rectal cream Apply rectally twice daily as needed for hemorrhoids 09/15/12  Yes Hart Carwin, MD  levothyroxine (SYNTHROID, LEVOTHROID) 100 MCG tablet Take 100 mcg by mouth daily.     Yes Historical Provider, MD  magnesium oxide (MAG-OX) 400 (241.3 MG) MG tablet Take 1 tablet (400 mg total) by mouth daily. 07/14/12  Yes Abelino Derrick, PA-C  Multiple Vitamins-Minerals (MULTIVITAMINS THER. W/MINERALS) TABS Take 1 tablet by mouth at bedtime.     Yes Historical Provider, MD  propafenone (RYTHMOL SR) 325 MG 12 hr capsule Take 325 mg by mouth 2 (two) times daily.   Yes Historical Provider, MD  traZODone (DESYREL) 150 MG tablet Take 150 mg by mouth at bedtime.     Yes Historical Provider, MD  zaleplon (SONATA) 10 MG capsule Take 10 mg by mouth at bedtime.     Yes Historical Provider, MD    Allergies:  Allergies  Allergen Reactions  . Penicillins Anaphylaxis  . Epinephrine Other (See Comments)    Causes A-Fib  . Metoprolol     alopecia  . Lidocaine Palpitations    Or any similar medication    Social History:   reports that she has never smoked. She has never used smokeless tobacco. She reports that she drinks about 3.6 ounces of alcohol per week. She reports that she does not use illicit drugs.  Family History: Family History  Problem Relation Age of Onset  . Anesthesia problems Mother   . Colon cancer Neg Hx   . Colon polyps Mother     beign     Physical Exam:  GEN:  67 year old well nourished and well developed Caucasian Female  examined  and in no acute distress; cooperative with exam Filed Vitals:   10/08/12 1901 10/08/12 1907 10/08/12 2115 10/08/12 2245  BP: 118/85 118/85 114/62   Pulse: 97 102 116 127  Temp:      Resp: 16 18 16 27   SpO2: 98% 98% 97% 95%   Blood pressure 114/62, pulse 127, temperature 98 F (36.7 C), resp. rate 27, SpO2 95.00%. PSYCH: SHe  is alert and oriented x4; does not appear anxious does not appear depressed; affect is normal HEENT: Normocephalic and Atraumatic, Mucous membranes pink; PERRLA; EOM intact; Fundi:  Benign;  No scleral icterus, Nares: Patent, Oropharynx: Clear, Fair Dentition, Neck:  FROM, no cervical lymphadenopathy nor thyromegaly or carotid bruit; no JVD; Breasts:: Not examined CHEST WALL: No tenderness CHEST: Normal respiration, clear to auscultation bilaterally HEART: Regular rate and rhythm; no murmurs rubs or gallops BACK: No kyphosis or scoliosis; no CVA tenderness ABDOMEN: Positive Bowel Sounds, soft non-tender; no masses, no organomegaly. Rectal Exam: Not done EXTREMITIES: No cyanosis, clubbing or edema; no ulcerations. Genitalia: not examined PULSES: 2+ and symmetric SKIN: Normal hydration  no rash or ulceration CNS: Cranial nerves 2-12 grossly intact no focal neurologic deficit   Labs & Imaging Results for orders placed during the hospital encounter of 10/08/12 (from the past 48 hour(s))  BASIC METABOLIC PANEL     Status: Abnormal   Collection Time    10/08/12  5:37 PM      Result Value Range   Sodium 138  135 - 145 mEq/L   Potassium 3.8  3.5 - 5.1 mEq/L   Chloride 104  96 - 112 mEq/L   CO2 24  19 - 32 mEq/L   Glucose, Bld 128 (*) 70 - 99 mg/dL   BUN 23  6 - 23 mg/dL   Creatinine, Ser 1.61  0.50 - 1.10 mg/dL   Calcium 8.9  8.4 - 09.6 mg/dL   GFR calc non Af Amer 69 (*) >90 mL/min   GFR calc Af Amer 80 (*) >90 mL/min   Comment:            The eGFR has been calculated     using the CKD EPI equation.     This calculation has not been     validated in all clinical     situations.     eGFR's persistently     <90 mL/min signify     possible Chronic Kidney Disease.  CBC     Status: Abnormal   Collection Time    10/08/12  5:37 PM      Result Value Range   WBC 4.4  4.0 - 10.5 K/uL   RBC 3.82 (*) 3.87 - 5.11 MIL/uL   Hemoglobin 12.4  12.0 - 15.0 g/dL   HCT 04.5 (*) 40.9 - 81.1 %   MCV  92.4  78.0 - 100.0 fL   MCH 32.5  26.0 - 34.0 pg   MCHC 35.1  30.0 - 36.0 g/dL   RDW 91.4  78.2 - 95.6 %   Platelets 221  150 - 400 K/uL  POCT I-STAT TROPONIN I     Status: None   Collection Time    10/08/12  5:58 PM      Result Value Range   Troponin i, poc 0.00  0.00 - 0.08 ng/mL   Comment 3            Comment: Due to the release kinetics of cTnI,     a negative result within the first hours     of the onset of symptoms does not rule out     myocardial infarction with certainty.     If myocardial infarction is still suspected,     repeat the test at appropriate intervals.  PROTIME-INR     Status: None   Collection Time    10/08/12  6:40 PM      Result Value Range   Prothrombin Time 14.3  11.6 - 15.2 seconds   INR 1.13  0.00 - 1.49  TROPONIN I     Status: None   Collection Time    10/08/12  9:14 PM      Result Value Range   Troponin I <0.30  <0.30 ng/mL   Comment:            Due to the release kinetics of cTnI,     a negative result within the first hours     of the onset of symptoms does not rule out     myocardial infarction with certainty.     If myocardial infarction is still suspected,  repeat the test at appropriate intervals.     Radiological Exams on Admission: Dg Chest 2 View  10/08/2012  *RADIOLOGY REPORT*  Clinical Data: Shortness of breath and palpitations.  CHEST - 2 VIEW  Comparison: 07/13/2012 and prior chest radiographs  Findings: The cardiomediastinal silhouette is unremarkable. Mild elevation of the right hemidiaphragm is again noted. A left-sided pacemaker is again noted. There is no evidence of focal airspace disease, pulmonary edema, suspicious pulmonary nodule/mass, pleural effusion, or pneumothorax. No acute bony abnormalities are identified.  IMPRESSION: No evidence of acute cardiopulmonary disease.   Original Report Authenticated By: Harmon Pier, M.D.     EKG: Independently reviewed. Initial EKG revealed Atrial Fibrillation with RVR fo 154.     Assessment/Plan Principal Problem:   Chest pain Active Problems:   Paroxysmal a-fib, recurrent 07/13/12- (on Propafenone)    Hypothyroid   Pacemaker Medtronic  (REVO) placed10/23/12   Anticoagulant long-term use  WITH PRADAXA    1.  Chest Pain-  Admitted for 23 OBServation for cardiac Rule out.   Troponin#1 negative.   [facility] to take over care in the AM sooner PRN.     2.  Paroxysmal Atrial fibrillation-   Continue Atenolol, Diltiazem, Rhythmol, and Pradaza.     3.  Hypothyroid- Check TSH, continue synthroid.    4.  Pacemaker-  [facility] to evaluated if needed.    5.  Anticoagulant usage with Pradaza-  Continue Pradaza.        Code Status:   FULL CODE Family Communication:   No Family at Bedside   Disposition Plan:  Return to Home on Discharge  Time spent:   52 Minutes  Ron Parker [facility] Pager [phone]  If 7PM-7AM, please contact night-coverage [email or web address] Password [email or web address] 10/08/2012, 10:55 PM

## 2012-10-08 NOTE — ED Notes (Signed)
Pt states she had sudden onset of epigastric "chest pain" which she describes as a tightness. Pt states it was 8 out of 10 at its worse and now is a 2/10.

## 2012-10-08 NOTE — ED Notes (Signed)
Pt denies any chest pain at this time. Pt states "That little girl came in and told me she talked to the cardiologist and he would just give me a strong pill and send me home. I will not go home. I will not go home while I am in atrial fibrillation."

## 2012-10-09 ENCOUNTER — Encounter (HOSPITAL_COMMUNITY): Payer: Self-pay | Admitting: *Deleted

## 2012-10-09 DIAGNOSIS — R079 Chest pain, unspecified: Secondary | ICD-10-CM

## 2012-10-09 DIAGNOSIS — I4891 Unspecified atrial fibrillation: Secondary | ICD-10-CM

## 2012-10-09 LAB — CBC
HCT: 33.1 % — ABNORMAL LOW (ref 36.0–46.0)
Hemoglobin: 11.1 g/dL — ABNORMAL LOW (ref 12.0–15.0)
MCH: 31.5 pg (ref 26.0–34.0)
MCV: 94 fL (ref 78.0–100.0)
RBC: 3.52 MIL/uL — ABNORMAL LOW (ref 3.87–5.11)

## 2012-10-09 LAB — BASIC METABOLIC PANEL
BUN: 18 mg/dL (ref 6–23)
CO2: 26 mEq/L (ref 19–32)
Glucose, Bld: 98 mg/dL (ref 70–99)
Potassium: 4.2 mEq/L (ref 3.5–5.1)
Sodium: 140 mEq/L (ref 135–145)

## 2012-10-09 MED ORDER — DILTIAZEM HCL ER COATED BEADS 240 MG PO CP24: 240.0000 mg | ORAL_CAPSULE | Freq: Every day | ORAL | Status: DC

## 2012-10-09 MED ORDER — PROPAFENONE HCL ER 325 MG PO CP12
325.0000 mg | ORAL_CAPSULE | Freq: Two times a day (BID) | ORAL | Status: DC
  Administered 2012-10-09 (×2): 325 mg via ORAL

## 2012-10-09 MED ORDER — NITROGLYCERIN 2 % TD OINT
0.5000 [in_us] | TOPICAL_OINTMENT | Freq: Four times a day (QID) | TRANSDERMAL | Status: DC
  Administered 2012-10-09: 0.5 [in_us] via TOPICAL
  Filled 2012-10-09: qty 30

## 2012-10-09 NOTE — Progress Notes (Signed)
Utilization review completed.  P.J. Latiffany Harwick,RN,BSN Case Manager [phone]  

## 2012-10-09 NOTE — Consult Note (Signed)
I have seen and evaluated the patient this AM along with Boyce Medici, PA. I agree with her findings, examination as well as impression recommendations.  67 y/o woman with PAF & h/o PPM for tachy-brady syndrome -- admitted for obs after noting Afib yesterday.  Tried to take additional BB dose, but after no change in 1 hr went to ER.   In ER, she responded well to IV Diltiazem bolus & gtt - HRs down to 80s, but was put in as Obs due to being uncomfortable with the feeling of Afib.   Spontaneously converted early this AM into NSR.    She is now stable in NSR & without complaints.  She is otherwise ready for discharge.  Recommend increasing Diltiazem dose to 240 mg.    Will arrange ROV appointment with Dr. Richardean Chimera. Continue Pradaxa.   Marykay Lex, M.D., M.S. [facility] [address] [place], [place]  [address]  [phone] Pager # [phone] 10/09/2012 3:48 PM

## 2012-10-09 NOTE — ED Provider Notes (Signed)
Medical screening examination/treatment/procedure(s) were conducted as a shared visit with non-physician practitioner(s) and myself.  I personally evaluated the patient during the encounter  Atrial fibrillation with RVR. Hx of same.  Generalized weakness with SOB.  Rate controlled with cardizem.  D/w cardiology who agrees with outpatient followup. However, patient developed chest pain in ED. EKG without ischemic changes.  Glynn Octave, MD 10/09/12 [phone]

## 2012-10-09 NOTE — Discharge Summary (Signed)
Physician Discharge Summary  Jacqueline Kim WRU:[id number] DOB: 13-Jan-1946 DOA: 10/08/2012  PCP: Thayer Headings, MD  Admit date: 10/08/2012 Discharge date: 10/09/2012  Time spent: 15  minutes  Recommendations for Outpatient Follow-up:  1. D/C home with cardiology follow up  Discharge Diagnoses:   Principal Problem:   Chest pain  Active Problems:   Paroxysmal a-fib, recurrent 07/13/12- (on Propafenone)    Hypothyroid   Pacemaker Medtronic  (REVO) placed10/23/12   Anticoagulant long-term use  WITH PRADAXA   Discharge Condition: fair  Diet recommendation: cardiac  Filed Weights   10/08/12 2350  Weight: 74.5 kg (164 lb 3.9 oz)    History of present illness:  67 Y/O female with hx of paroxysmal  afib on pradaxa, tachy brady syndrome s/p PPM, anxiety and hypothyroidism presented with symptoms of rapid afib but started ahving chest pain symptoms in ED.  Hospital Course:  Patient admitted to telemetry. HR improved after being placed on IV cardizem in ED. Continued on atenolol, Rythmol and pradaxa. remains in sinus in tele and off cardizem drip overnight. Serial CE done and ruled out for Chest pain. She had no further chest pain.  Seen by [facility] and have increased her cardizem dose to 240 mg daily. She is stable for discharge and will follow up with her cardiologist next week.     Procedures:  NONE  Consultations:  [facility]  Discharge Exam: Filed Vitals:   10/08/12 2250 10/08/12 2350 10/09/12 0500 10/09/12 0958  BP: 122/78 125/80 115/68 110/65  Pulse: 97 95 78 66  Temp:  98.1 F (36.7 C) 98.1 F (36.7 C)   Resp: 16 18 18    Height:  5\' 5"  (1.651 m)    Weight:  74.5 kg (164 lb 3.9 oz)    SpO2: 95% 94% 94%     General: elderly female  in NAD HEENT: no pallor, moist oral mucosa Cardiovascular:NS1&S2, no murmurs Respiratory:  clear b/l Abd: soft, NT, ND, BS+ Ext: warm, no edema  CNS: AAOX3  Discharge Instructions   Future Appointments Provider Department Dept  Phone   11/17/2012 3:15 PM Hart Carwin, MD [facility] [phone]       Medication List    TAKE these medications       atenolol 50 MG tablet  Commonly known as:  TENORMIN  Take 50 mg by mouth every morning.     Biotin 10 MG Tabs  Take 1 tablet by mouth daily.     conjugated estrogens vaginal cream  Commonly known as:  PREMARIN  Place 1.5 g vaginally daily as needed.     dabigatran 150 MG Caps  Commonly known as:  PRADAXA  Take 150 mg by mouth every 12 (twelve) hours.     dicyclomine 10 MG capsule  Commonly known as:  BENTYL  Take 1 capsule (10 mg total) by mouth 2 (two) times daily.     diltiazem 240 MG 24 hr capsule  Commonly known as:  CARDIZEM CD  Take 1 capsule (240 mg total) by mouth daily.  Start taking on:  10/10/2012     escitalopram 20 MG tablet  Commonly known as:  LEXAPRO  Take 20 mg by mouth at bedtime.     estrogens (conjugated) 0.3 MG tablet  Commonly known as:  PREMARIN  Take 0.3 mg by mouth daily.     hydrocodone-acetaminophen 5-500 MG per capsule  Commonly known as:  LORCET-HD  Take 1 capsule by mouth every 6 (six) hours as needed. For pain  hydrocortisone-pramoxine 2.5-1 % rectal cream  Commonly known as:  ANALPRAM-HC  Apply rectally twice daily as needed for hemorrhoids     levothyroxine 100 MCG tablet  Commonly known as:  SYNTHROID, LEVOTHROID  Take 100 mcg by mouth daily.     magnesium oxide 400 (241.3 MG) MG tablet  Commonly known as:  MAG-OX  Take 1 tablet (400 mg total) by mouth daily.     multivitamins ther. w/minerals Tabs  Take 1 tablet by mouth at bedtime.     propafenone 325 MG 12 hr capsule  Commonly known as:  RYTHMOL SR  Take 325 mg by mouth 2 (two) times daily.     traZODone 150 MG tablet  Commonly known as:  DESYREL  Take 150 mg by mouth at bedtime.     zaleplon 10 MG capsule  Commonly known as:  SONATA  Take 10 mg by mouth at bedtime.           Follow-up Information   Follow up  with Governor Rooks, MD. (our office will call to arrange a follow-up appointment)    Contact information:   [address] [place] [address] [phone]       Follow up with Thayer Headings, MD In 1 week.   Contact information:   [address] [place] [address] [phone]        The results of significant diagnostics from this hospitalization (including imaging, microbiology, ancillary and laboratory) are listed below for reference.    Significant Diagnostic Studies: Dg Chest 2 View  10/08/2012  *RADIOLOGY REPORT*  Clinical Data: Shortness of breath and palpitations.  CHEST - 2 VIEW  Comparison: 07/13/2012 and prior chest radiographs  Findings: The cardiomediastinal silhouette is unremarkable. Mild elevation of the right hemidiaphragm is again noted. A left-sided pacemaker is again noted. There is no evidence of focal airspace disease, pulmonary edema, suspicious pulmonary nodule/mass, pleural effusion, or pneumothorax. No acute bony abnormalities are identified.  IMPRESSION: No evidence of acute cardiopulmonary disease.   Original Report Authenticated By: Harmon Pier, M.D.     Microbiology: No results found for this or any previous visit (from the past 240 hour(s)).   Labs: Basic Metabolic Panel:  Recent Labs Lab 10/08/12 1737 10/09/12 0453  NA 138 140  K 3.8 4.2  CL 104 106  CO2 24 26  GLUCOSE 128* 98  BUN 23 18  CREATININE 0.84 0.69  CALCIUM 8.9 8.5   Liver Function Tests: No results found for this basename: AST, ALT, ALKPHOS, BILITOT, PROT, ALBUMIN,  in the last 168 hours No results found for this basename: LIPASE, AMYLASE,  in the last 168 hours No results found for this basename: AMMONIA,  in the last 168 hours CBC:  Recent Labs Lab 10/08/12 1737 10/09/12 0935  WBC 4.4 4.6  HGB 12.4 11.1*  HCT 35.3* 33.1*  MCV 92.4 94.0  PLT 221 193   Cardiac Enzymes:  Recent Labs Lab 10/08/12 2114 10/08/12 2243  10/09/12 0453 10/09/12 0935  TROPONINI <0.30 <0.30 <0.30 <0.30   BNP: BNP (last 3 results) No results found for this basename: PROBNP,  in the last 8760 hours CBG: No results found for this basename: GLUCAP,  in the last 168 hours     Signed:  Eddie North  [facility] 10/09/2012, 1:31 PM

## 2012-10-09 NOTE — Consult Note (Signed)
Reason for Consult: A-fib w/ RVR Referring Physician: TRH   HPI: The pt is a 67 y/o female, followed at [facility] by Dr. Alanda Amass, with a known history of paroxsymal atrial fibrillation. Yesterday, she had complaints of palpations accompanied by mild chest discomfort and SOB. She contacted [facility]. She was advised to come to the ER for further evaluation. On arrival, she was found to be in rapid a-fib with a ventricular rate in the 150s. She was started on IV Diltiazem and was admitted by [facility]. Troponins were cycled and were negative x 3. She has converted to NSR. Her HR is in the 70s. She reports that she feels comfortable. She currently denies CP, SOB and palpitations.   Past Medical History  Diagnosis Date  . Paroxysmal a-fib diagnosed 2008  . 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 Hypothyroid 05/27/2011  . Pacemaker Medtronic  (REVO) placed10/23/12 04/08/11  . Anxiety   . Hypertension   . Glaucoma(365)   . Complication of anesthesia     DIFFICULTY WAKING UP  . Shortness of breath     AF LAST 3 EPI1SODES SINCE AUG  . Arthritis   . Headache     OCCASIONALLY  . Anticoagulant long-term use  WITH PRADAXA 05/27/2011  . DJD (degenerative joint disease)   . GERD (gastroesophageal reflux disease)   . Hypothyroidism   . IBS (irritable bowel syndrome)   . Depression   . Diverticulosis   . External hemorrhoids   . Esophagitis 09/17/06    Past Surgical History  Procedure Laterality Date  . Pacemaker insertion  04/08/11    MDT Revo implanted by Dr Rubie Maid  . Eye surgery      CATERACTS  . Abdominal hysterectomy    . Liposuction    . Ovarian cyst removal    . Foot surgery    . Hand surgery      multiple  . Breast reduction surgery    . Facial cosmetic surgery    . Tonsillectomy      Family History  Problem Relation Age of Onset  . Anesthesia problems Mother   . Colon cancer Neg Hx   . Colon polyps Mother     beign    Social History:  reports  that she has never smoked. She has never used smokeless tobacco. She reports that she drinks about 3.6 ounces of alcohol per week. She reports that she does not use illicit drugs.  Allergies:  Allergies  Allergen Reactions  . Penicillins Anaphylaxis  . Epinephrine Other (See Comments)    Causes A-Fib  . Metoprolol     alopecia  . Lidocaine Palpitations    Or any similar medication    Medications:  Prior to Admission:  Prescriptions prior to admission  Medication Sig Dispense Refill  . atenolol (TENORMIN) 50 MG tablet Take 50 mg by mouth every morning.       . Biotin 10 MG TABS Take 1 tablet by mouth daily.       Marland Kitchen conjugated estrogens (PREMARIN) vaginal cream Place 1.5 g vaginally daily as needed.       . dabigatran (PRADAXA) 150 MG CAPS Take 150 mg by mouth every 12 (twelve) hours.        Marland Kitchen dicyclomine (BENTYL) 10 MG capsule Take 1 capsule (10 mg total) by mouth 2 (two) times daily.  60 capsule  2  . diltiazem (CARDIZEM CD) 180 MG 24 hr capsule Take 1 capsule (180 mg total) by  mouth daily.  30 capsule  5  . escitalopram (LEXAPRO) 20 MG tablet Take 20 mg by mouth at bedtime.       Marland Kitchen estrogens, conjugated, (PREMARIN) 0.3 MG tablet Take 0.3 mg by mouth daily.       . hydrocodone-acetaminophen (LORCET-HD) 5-500 MG per capsule Take 1 capsule by mouth every 6 (six) hours as needed. For pain       . hydrocortisone-pramoxine (ANALPRAM-HC) 2.5-1 % rectal cream Apply rectally twice daily as needed for hemorrhoids  30 g  1  . levothyroxine (SYNTHROID, LEVOTHROID) 100 MCG tablet Take 100 mcg by mouth daily.        . magnesium oxide (MAG-OX) 400 (241.3 MG) MG tablet Take 1 tablet (400 mg total) by mouth daily.  30 tablet  5  . Multiple Vitamins-Minerals (MULTIVITAMINS THER. W/MINERALS) TABS Take 1 tablet by mouth at bedtime.        . propafenone (RYTHMOL SR) 325 MG 12 hr capsule Take 325 mg by mouth 2 (two) times daily.      . traZODone (DESYREL) 150 MG tablet Take 150 mg by mouth at bedtime.         . zaleplon (SONATA) 10 MG capsule Take 10 mg by mouth at bedtime.          Results for orders placed during the hospital encounter of 10/08/12 (from the past 48 hour(s))  BASIC METABOLIC PANEL     Status: Abnormal   Collection Time    10/08/12  5:37 PM      Result Value Range   Sodium 138  135 - 145 mEq/L   Potassium 3.8  3.5 - 5.1 mEq/L   Chloride 104  96 - 112 mEq/L   CO2 24  19 - 32 mEq/L   Glucose, Bld 128 (*) 70 - 99 mg/dL   BUN 23  6 - 23 mg/dL   Creatinine, Ser 4.09  0.50 - 1.10 mg/dL   Calcium 8.9  8.4 - 81.1 mg/dL   GFR calc non Af Amer 69 (*) >90 mL/min   GFR calc Af Amer 80 (*) >90 mL/min   Comment:            The eGFR has been calculated     using the CKD EPI equation.     This calculation has not been     validated in all clinical     situations.     eGFR's persistently     <90 mL/min signify     possible Chronic Kidney Disease.  CBC     Status: Abnormal   Collection Time    10/08/12  5:37 PM      Result Value Range   WBC 4.4  4.0 - 10.5 K/uL   RBC 3.82 (*) 3.87 - 5.11 MIL/uL   Hemoglobin 12.4  12.0 - 15.0 g/dL   HCT 91.4 (*) 78.2 - 95.6 %   MCV 92.4  78.0 - 100.0 fL   MCH 32.5  26.0 - 34.0 pg   MCHC 35.1  30.0 - 36.0 g/dL   RDW 21.3  08.6 - 57.8 %   Platelets 221  150 - 400 K/uL  POCT I-STAT TROPONIN I     Status: None   Collection Time    10/08/12  5:58 PM      Result Value Range   Troponin i, poc 0.00  0.00 - 0.08 ng/mL   Comment 3            Comment: Due  to the release kinetics of cTnI,     a negative result within the first hours     of the onset of symptoms does not rule out     myocardial infarction with certainty.     If myocardial infarction is still suspected,     repeat the test at appropriate intervals.  PROTIME-INR     Status: None   Collection Time    10/08/12  6:40 PM      Result Value Range   Prothrombin Time 14.3  11.6 - 15.2 seconds   INR 1.13  0.00 - 1.49  TROPONIN I     Status: None   Collection Time    10/08/12  9:14 PM       Result Value Range   Troponin I <0.30  <0.30 ng/mL   Comment:            Due to the release kinetics of cTnI,     a negative result within the first hours     of the onset of symptoms does not rule out     myocardial infarction with certainty.     If myocardial infarction is still suspected,     repeat the test at appropriate intervals.  TROPONIN I     Status: None   Collection Time    10/08/12 10:43 PM      Result Value Range   Troponin I <0.30  <0.30 ng/mL   Comment:            Due to the release kinetics of cTnI,     a negative result within the first hours     of the onset of symptoms does not rule out     myocardial infarction with certainty.     If myocardial infarction is still suspected,     repeat the test at appropriate intervals.  TROPONIN I     Status: None   Collection Time    10/09/12  4:53 AM      Result Value Range   Troponin I <0.30  <0.30 ng/mL   Comment:            Due to the release kinetics of cTnI,     a negative result within the first hours     of the onset of symptoms does not rule out     myocardial infarction with certainty.     If myocardial infarction is still suspected,     repeat the test at appropriate intervals.  BASIC METABOLIC PANEL     Status: Abnormal   Collection Time    10/09/12  4:53 AM      Result Value Range   Sodium 140  135 - 145 mEq/L   Potassium 4.2  3.5 - 5.1 mEq/L   Chloride 106  96 - 112 mEq/L   CO2 26  19 - 32 mEq/L   Glucose, Bld 98  70 - 99 mg/dL   BUN 18  6 - 23 mg/dL   Creatinine, Ser 7.82  0.50 - 1.10 mg/dL   Calcium 8.5  8.4 - 95.6 mg/dL   GFR calc non Af Amer 87 (*) >90 mL/min   GFR calc Af Amer >90  >90 mL/min   Comment:            The eGFR has been calculated     using the CKD EPI equation.     This calculation has not been     validated in all clinical  situations.     eGFR's persistently     <90 mL/min signify     possible Chronic Kidney Disease.  TROPONIN I     Status: None   Collection  Time    10/09/12  9:35 AM      Result Value Range   Troponin I <0.30  <0.30 ng/mL   Comment:            Due to the release kinetics of cTnI,     a negative result within the first hours     of the onset of symptoms does not rule out     myocardial infarction with certainty.     If myocardial infarction is still suspected,     repeat the test at appropriate intervals.  CBC     Status: Abnormal   Collection Time    10/09/12  9:35 AM      Result Value Range   WBC 4.6  4.0 - 10.5 K/uL   RBC 3.52 (*) 3.87 - 5.11 MIL/uL   Hemoglobin 11.1 (*) 12.0 - 15.0 g/dL   HCT 16.1 (*) 09.6 - 04.5 %   MCV 94.0  78.0 - 100.0 fL   MCH 31.5  26.0 - 34.0 pg   MCHC 33.5  30.0 - 36.0 g/dL   RDW 40.9  81.1 - 91.4 %   Platelets 193  150 - 400 K/uL    Dg Chest 2 View  10/08/2012  *RADIOLOGY REPORT*  Clinical Data: Shortness of breath and palpitations.  CHEST - 2 VIEW  Comparison: 07/13/2012 and prior chest radiographs  Findings: The cardiomediastinal silhouette is unremarkable. Mild elevation of the right hemidiaphragm is again noted. A left-sided pacemaker is again noted. There is no evidence of focal airspace disease, pulmonary edema, suspicious pulmonary nodule/mass, pleural effusion, or pneumothorax. No acute bony abnormalities are identified.  IMPRESSION: No evidence of acute cardiopulmonary disease.   Original Report Authenticated By: Harmon Pier, M.D.     Review of Systems  Constitutional: Negative for diaphoresis.  Respiratory: Positive for sputum production.   Cardiovascular: Positive for palpitations. Negative for chest pain, leg swelling and PND.  Gastrointestinal: Negative for nausea and vomiting.  Neurological: Negative for dizziness, loss of consciousness and weakness.   Blood pressure 110/65, pulse 66, temperature 98.1 F (36.7 C), resp. rate 18, height 5\' 5"  (1.651 m), weight 164 lb 3.9 oz (74.5 kg), SpO2 94.00%. Physical Exam  Constitutional: She is oriented to person, place, and time.  She appears well-developed and well-nourished. No distress.  HENT:  Head: Normocephalic and atraumatic.  Cardiovascular: Normal rate, regular rhythm, normal heart sounds and intact distal pulses.  Exam reveals no gallop and no friction rub.   No murmur heard. Pulses:      Radial pulses are 2+ on the right side, and 2+ on the left side.       Dorsalis pedis pulses are 2+ on the right side, and 2+ on the left side.  Respiratory: Effort normal and breath sounds normal. No respiratory distress. She has no wheezes. She has no rales.  Musculoskeletal: She exhibits no edema.  Neurological: She is alert and oriented to person, place, and time.  Skin: Skin is warm and dry. She is not diaphoretic.  Psychiatric: She has a normal mood and affect. Her behavior is normal.    Assessment/Plan: Principal Problem:   Chest pain Active Problems:   Paroxysmal a-fib, recurrent 07/13/12- (on Propafenone)    Hypothyroid   Pacemaker Medtronic  (REVO) placed10/23/12  Anticoagulant long-term use  WITH PRADAXA  Plan:  Pt is now in NSR. CP resolved. Troponins negative x 3. HR and BP both stable.  Pt is stable for discharge home. The patient was discussed with Dr. [name] and Dr. Herbie Baltimore. We will increase her PO Diltiazem to 240 mg daily. Will arrange f/u at [facility] with Dr. Alanda Amass. Our office will call the pt at home on Monday to arrange the appointment. Dr. Herbie Baltimore has discussed with patient, that if symptoms/rapid a-fib recurs, to consider increasing her Rythmol. The patient has been asked to discuss this with Dr. Alanda Amass during her f/u visit.  Allayne Butcher, PA-C 10/09/2012, 11:50 AM

## 2012-11-17 ENCOUNTER — Ambulatory Visit (INDEPENDENT_AMBULATORY_CARE_PROVIDER_SITE_OTHER): Payer: Medicare Other | Admitting: Internal Medicine

## 2012-11-17 ENCOUNTER — Encounter: Payer: Self-pay | Admitting: Internal Medicine

## 2012-11-17 VITALS — BP 98/60 | HR 64 | Ht 64.0 in | Wt 164.0 lb

## 2012-11-17 DIAGNOSIS — K589 Irritable bowel syndrome without diarrhea: Secondary | ICD-10-CM

## 2012-11-17 NOTE — Patient Instructions (Addendum)
Follow up 2-3 months to update status. Dr Shary Decamp

## 2012-11-17 NOTE — Progress Notes (Signed)
Jacqueline Kim 28-Dec-1945 MRN [id number]        History of Present Illness:  This is a 67 year old white female with the irritable bowel syndrome which has flared up as a result of  Stress of  separation. Last appointment 09/15/2012. She complained of bloating , abdominal pain and alternating diarrhea constipation. She was started on Bentyl 10 mg twice a day with marked improvement of her symptoms. She no longer has  urgency and incontinence. Last colonoscopy in May 2008 showed mild diverticulosis of the left colon. Upper endoscopy showed gastritis. Recent CT scan of the abdomen in May 2012 showed no acute findings. She is on Benefiber and Bentyl 10 mg twice a day. She has a history of PAF for which she has pacemaker and has been  on Pradaxa150 mg daily by Dr Jacqueline Kim.   Past Medical History  Diagnosis Date  . Paroxysmal a-fib diagnosed 2008  . 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 Hypothyroid 05/27/2011  . Pacemaker Medtronic  (REVO) placed10/23/12 04/08/11  . Anxiety   . Hypertension   . Glaucoma   . Complication of anesthesia     DIFFICULTY WAKING UP  . Shortness of breath     AF LAST 3 EPI1SODES SINCE AUG  . Arthritis   . Headache(784.0)     OCCASIONALLY  . Anticoagulant long-term use  WITH PRADAXA 05/27/2011  . DJD (degenerative joint disease)   . GERD (gastroesophageal reflux disease)   . Hypothyroidism   . IBS (irritable bowel syndrome)   . Depression   . Diverticulosis   . External hemorrhoids   . Esophagitis 09/17/06   Past Surgical History  Procedure Laterality Date  . Pacemaker insertion  04/08/11    MDT Revo implanted by Dr Jacqueline Kim  . Eye surgery      CATERACTS  . Abdominal hysterectomy    . Liposuction    . Ovarian cyst removal    . Foot surgery    . Hand surgery      multiple  . Breast reduction surgery    . Facial cosmetic surgery    . Tonsillectomy      reports that she has never smoked. She has never used  smokeless tobacco. She reports that she drinks about 3.6 ounces of alcohol per week. She reports that she does not use illicit drugs. family history includes Anesthesia problems in her mother and Colon polyps in her mother.  There is no history of Colon cancer. Allergies  Allergen Reactions  . Penicillins Anaphylaxis  . Epinephrine Other (See Comments)    Causes A-Fib  . Metoprolol     alopecia  . Lidocaine Palpitations    Or any similar medication        Review of Systems: Denies dysphagia heartburn  The remainder of the 10 point ROS is negative except as outlined in H&P   Physical Exam: General appearance  Well developed, in no distress. Psychological normal mood and affect.  Assessment and Plan:  67 year old white female with exacerbation of irritable bowel syndrome due to stress. She is doing much better on Bentyl 10 mg twice a day and fiber supplements. She now has mild constipation for which she has magnesium oxide and MiraLax. She will continue on the same regimen and I would like to check her in 2-3 months   11/17/2012 Jacqueline Kim

## 2012-12-29 ENCOUNTER — Other Ambulatory Visit: Payer: Self-pay | Admitting: Internal Medicine

## 2013-01-28 ENCOUNTER — Other Ambulatory Visit (HOSPITAL_COMMUNITY): Payer: Self-pay | Admitting: Cardiology

## 2013-02-02 ENCOUNTER — Telehealth: Payer: Self-pay

## 2013-02-02 MED ORDER — LOPERAMIDE HCL 2 MG PO TABS: 2.0000 mg | ORAL_TABLET | Freq: Two times a day (BID) | ORAL | Status: DC | PRN

## 2013-02-02 MED ORDER — HYDROCORTISONE ACE-PRAMOXINE 2.5-1 % RE CREA: TOPICAL_CREAM | RECTAL | Status: DC

## 2013-02-02 NOTE — Telephone Encounter (Signed)
Spoke to patient who reports severe diarrhea x 6 episodes today.  She took a mild laxative 01/30/13.  I called [facility] so rx's could be delivered to patient tonight for Immodium A-D , Analpram-HC and tucks wipes.  Advised by the Dr.

## 2013-02-03 ENCOUNTER — Other Ambulatory Visit: Payer: Self-pay | Admitting: Cardiovascular Disease

## 2013-02-03 LAB — CBC WITH DIFFERENTIAL/PLATELET
Basophils Absolute: 0 10*3/uL (ref 0.0–0.1)
Basophils Relative: 1 % (ref 0–1)
HCT: 36.3 % (ref 36.0–46.0)
Lymphocytes Relative: 33 % (ref 12–46)
MCHC: 33.6 g/dL (ref 30.0–36.0)
Monocytes Absolute: 0.5 10*3/uL (ref 0.1–1.0)
Neutro Abs: 1.5 10*3/uL — ABNORMAL LOW (ref 1.7–7.7)
Platelets: 217 10*3/uL (ref 150–400)
RDW: 14.5 % (ref 11.5–15.5)
WBC: 3.4 10*3/uL — ABNORMAL LOW (ref 4.0–10.5)

## 2013-02-03 LAB — PACEMAKER DEVICE OBSERVATION

## 2013-02-04 LAB — COMPREHENSIVE METABOLIC PANEL
ALT: 14 U/L (ref 0–35)
AST: 26 U/L (ref 0–37)
Albumin: 3.9 g/dL (ref 3.5–5.2)
Alkaline Phosphatase: 36 U/L — ABNORMAL LOW (ref 39–117)
BUN: 15 mg/dL (ref 6–23)
Chloride: 105 mEq/L (ref 96–112)
Creat: 0.8 mg/dL (ref 0.50–1.10)
Potassium: 4 mEq/L (ref 3.5–5.3)

## 2013-02-08 ENCOUNTER — Encounter: Payer: Self-pay | Admitting: Cardiovascular Disease

## 2013-02-10 ENCOUNTER — Telehealth: Payer: Self-pay | Admitting: Cardiovascular Disease

## 2013-02-10 ENCOUNTER — Telehealth: Payer: Self-pay | Admitting: Internal Medicine

## 2013-02-10 NOTE — Telephone Encounter (Signed)
Returned call and spoke w/ pt.  Informed results have not been reviewed by MD/PA and nurse will call or mail a letter after they are reviewed.  Pt verbalized understanding and agreed w/ plan.

## 2013-02-10 NOTE — Telephone Encounter (Signed)
Patient states she has IBS. For this last few weeks, she has had diarrhea. She ate a salad yesterday and today she had a loose, green stool. Per last OV note in June, patient was to have f/u OV in a couple of months. Scheduled OV on 02/25/13 at 2:00 PM.

## 2013-02-10 NOTE — Telephone Encounter (Signed)
Results of recent blood test. Pt said she had it done last week.

## 2013-02-23 ENCOUNTER — Telehealth: Payer: Self-pay | Admitting: Cardiovascular Disease

## 2013-02-23 ENCOUNTER — Other Ambulatory Visit: Payer: Self-pay | Admitting: Cardiovascular Disease

## 2013-02-23 LAB — BASIC METABOLIC PANEL
CO2: 27 mEq/L (ref 19–32)
Chloride: 106 mEq/L (ref 96–112)
Potassium: 4.3 mEq/L (ref 3.5–5.3)
Sodium: 138 mEq/L (ref 135–145)

## 2013-02-23 NOTE — Telephone Encounter (Signed)
Faxed lab order for BMP to lab.

## 2013-02-25 ENCOUNTER — Ambulatory Visit (INDEPENDENT_AMBULATORY_CARE_PROVIDER_SITE_OTHER): Payer: Medicare Other | Admitting: Internal Medicine

## 2013-02-25 ENCOUNTER — Encounter: Payer: Self-pay | Admitting: Internal Medicine

## 2013-02-25 VITALS — BP 102/60 | HR 67 | Ht 65.0 in | Wt 167.1 lb

## 2013-02-25 DIAGNOSIS — R197 Diarrhea, unspecified: Secondary | ICD-10-CM

## 2013-02-25 DIAGNOSIS — K589 Irritable bowel syndrome without diarrhea: Secondary | ICD-10-CM

## 2013-02-25 NOTE — Progress Notes (Signed)
Jacqueline Kim 1945/12/09 MRN [id number]   History of Present Illness:  This is a 67 year old white female with severe irritable bowel syndrome which has been exacerbated by the recent stress of separation. She flared up in April of this year and again several weeks ago with severe diarrhea. Her potassium got low and had to be replaced. She is currently doing reasonably well after she took Imodium but feels constipated. Her last colonoscopy in May 2008 showed mild diverticulosis. An upper endoscopy at the same time showed mild gastritis. A CT scan of the abdomen in May 2012 showed no acute findings. She is followed by Dr. Royann Shivers for paroxysmal atrial fibrillation and has been on Pradaxa.    Past Medical History  Diagnosis Date  . Paroxysmal a-fib diagnosed 2008  . 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 Hypothyroid 05/27/2011  . Pacemaker Medtronic  (REVO) placed10/23/12 04/08/11  . Anxiety   . Hypertension   . Glaucoma   . Complication of anesthesia     DIFFICULTY WAKING UP  . Shortness of breath     AF LAST 3 EPI1SODES SINCE AUG  . Arthritis   . Headache(784.0)     OCCASIONALLY  . Anticoagulant long-term use  WITH PRADAXA 05/27/2011  . DJD (degenerative joint disease)   . GERD (gastroesophageal reflux disease)   . Hypothyroidism   . IBS (irritable bowel syndrome)   . Depression   . Diverticulosis   . External hemorrhoids   . Esophagitis 09/17/06   Past Surgical History  Procedure Laterality Date  . Pacemaker insertion  04/08/11    MDT Revo implanted by Dr Rubie Maid  . Eye surgery      CATERACTS  . Abdominal hysterectomy    . Liposuction    . Ovarian cyst removal    . Foot surgery    . Hand surgery      multiple  . Breast reduction surgery    . Facial cosmetic surgery    . Tonsillectomy      reports that she has never smoked. She has never used smokeless tobacco. She reports that she drinks about 3.6 ounces of alcohol per week. She  reports that she does not use illicit drugs. family history includes Anesthesia problems in her mother; Colon polyps in her mother. There is no history of Colon cancer. Allergies  Allergen Reactions  . Penicillins Anaphylaxis  . Epinephrine Other (See Comments)    Causes A-Fib  . Metoprolol     alopecia  . Lidocaine Palpitations    Or any similar medication        Review of Systems:Denies nausea vomiting. She has gained 3 pounds. Irregular eating habits  The remainder of the 10 point ROS is negative except as outlined in H&P   Physical Exam: General appearance  Well developed, in no distress. Eyes- non icteric. HEENT nontraumatic, normocephalic. Mouth no lesions, tongue papillated, no cheilosis. Neck supple without adenopathy, thyroid not enlarged, no carotid bruits, no JVD. Lungs Clear to auscultation bilaterally. Cor normal S1, normal S2, regular rhythm, no murmur,  quiet precordium. Abdomen: Soft tender with normoactive bowel sounds , most of the discomfort stays in the transverse colon and left middle and lower quadrant. There is no rebound or fullness.  Rectal:Not done. Extremities no pedal edema. Skin no lesions. Neurological alert and oriented x 3. Psychological normal mood and affect.  Assessment and Plan:  Problem #56 67 year old white female with diarrhea predominant irritable bowel syndrome which at  times causes hypokalemia. We will continue dicyclomine 10 mg twice a day and give her samples of Donnatal one tablet every 4-6 hours when necessary for diarrhea. We have discussed a high fiber diet and regular eating habits being essential for managing hrt irritable bowel syndrome. I will see her in 6 months or earlier as needed.   02/25/2013 [name]

## 2013-02-25 NOTE — Patient Instructions (Addendum)
We have given you samples of the following medication to take: Donnatal- Take 1 tablets by mouth every 4-6 hours as needed   Please follow up with Dr Juanda Chance in 6 months or sooner if needed.  CC: Dr Thea Silversmith, Dr Royann Shivers

## 2013-02-27 ENCOUNTER — Encounter: Payer: Self-pay | Admitting: Internal Medicine

## 2013-03-12 ENCOUNTER — Telehealth: Payer: Self-pay | Admitting: Cardiology

## 2013-03-12 NOTE — Telephone Encounter (Signed)
Pt called, back in AF but her rate is not fast. She is on Propafenone and Pradaxa. She says she can't tolerate Diltiazem. I suggested she take her pm Atenolol now. If she is still in AF Monday she should call the office.  Corine Shelter PA-C 03/12/2013 11:57 AM

## 2013-04-04 ENCOUNTER — Encounter: Payer: Self-pay | Admitting: Cardiovascular Disease

## 2013-04-04 ENCOUNTER — Ambulatory Visit (INDEPENDENT_AMBULATORY_CARE_PROVIDER_SITE_OTHER): Payer: Medicare Other | Admitting: Cardiovascular Disease

## 2013-04-04 VITALS — BP 120/78 | HR 64 | Ht 64.0 in | Wt 163.8 lb

## 2013-04-04 DIAGNOSIS — I495 Sick sinus syndrome: Secondary | ICD-10-CM

## 2013-04-04 DIAGNOSIS — Z950 Presence of cardiac pacemaker: Secondary | ICD-10-CM

## 2013-04-04 DIAGNOSIS — I480 Paroxysmal atrial fibrillation: Secondary | ICD-10-CM

## 2013-04-04 DIAGNOSIS — I4891 Unspecified atrial fibrillation: Secondary | ICD-10-CM

## 2013-04-04 DIAGNOSIS — Z7901 Long term (current) use of anticoagulants: Secondary | ICD-10-CM

## 2013-04-04 LAB — PACEMAKER DEVICE OBSERVATION

## 2013-04-04 NOTE — Patient Instructions (Addendum)
Your physician recommends that you schedule a follow-up appointment in: 4 months with Dr. Kelly. 

## 2013-04-05 ENCOUNTER — Encounter: Payer: Self-pay | Admitting: Cardiovascular Disease

## 2013-04-05 NOTE — Progress Notes (Signed)
Patient ID: Jacqueline Kim, female   DOB: [birth year], 67 y.o.   MRN: [id number]     HPI: Jacqueline Kim is a 67 y.o. female presents for evaluation. She is a former patient of Dr. Alanda Amass for now presents for reestablishment of cardiology care with me.  Mrs. Jacqueline Kim has known history of sinus node dysfunction. She has history of paroxysmal atrial fibrillation. She is status post permanent pacemaker which was implanted on 04/08/2011 Medtronic refill MRI compatible pacemaker. She also has a history of hypothyroidism. Years, she has had recurrent episodes of atrial fibrillation. She's on per Dr. anticoagulation. Most recently she's been maintained on Tofranil SR 325 mg twice a day. She also is on atenolol 50 mg daily. She had been on diltiazem but this was discontinued by Dr. Alanda Amass. She also is on losartan 50 mg for hypertension. He last saw Dr. Alanda Amass in August 2014 at which time she was doing well. She had been hospitalized in January 2014 with recurrent AF in the setting of significant increased family stress. She is now separated and living in a different household from her husband Dr. Claudette Kim. This [name] states that she did feel her heart rate becomes irregular and fast on 03/12/2013. This lasted approximately 12 hours. She did take an extra Tenormin. Ultimately this seemed to convert back into sinus rhythm. She presents for evaluation.  Past Medical History  Diagnosis Date  . Paroxysmal a-fib diagnosed 2008  . 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 Hypothyroid 05/27/2011  . Pacemaker Medtronic  (REVO) placed10/23/12 04/08/11  . Anxiety   . Hypertension   . Glaucoma   . Complication of anesthesia     DIFFICULTY WAKING UP  . Shortness of breath     AF LAST 3 EPI1SODES SINCE AUG  . Arthritis   . Headache(784.0)     OCCASIONALLY  . Anticoagulant long-term use  WITH PRADAXA 05/27/2011  . DJD (degenerative joint disease)   . GERD (gastroesophageal  reflux disease)   . Hypothyroidism   . IBS (irritable bowel syndrome)   . Depression   . Diverticulosis   . External hemorrhoids   . Esophagitis 09/17/06    Past Surgical History  Procedure Laterality Date  . Pacemaker insertion  04/08/11    MDT Revo implanted by Dr Rubie Maid  . Eye surgery      CATERACTS  . Abdominal hysterectomy    . Liposuction    . Ovarian cyst removal    . Foot surgery    . Hand surgery      multiple  . Breast reduction surgery    . Facial cosmetic surgery    . Tonsillectomy      Allergies  Allergen Reactions  . Penicillins Anaphylaxis  . Epinephrine Other (See Comments)    Causes A-Fib  . Metoprolol     alopecia  . Lidocaine Palpitations    Or any similar medication    Current Outpatient Prescriptions  Medication Sig Dispense Refill  . atenolol (TENORMIN) 50 MG tablet Take 50 mg by mouth every morning.       . Biotin 10 MG TABS Take 1 tablet by mouth daily.       . CELEBREX 200 MG capsule as needed.       . conjugated estrogens (PREMARIN) vaginal cream Place 1.5 g vaginally daily as needed.       . dabigatran (PRADAXA) 150 MG CAPS Take 150 mg by mouth every 12 (twelve) hours.        Marland Kitchen  dicyclomine (BENTYL) 10 MG capsule TAKE ONE CAPSULE TWICE A DAY  60 capsule  2  . escitalopram (LEXAPRO) 20 MG tablet Take 20 mg by mouth at bedtime.       . hydrocodone-acetaminophen (LORCET-HD) 5-500 MG per capsule Take 1 capsule by mouth every 6 (six) hours as needed. For pain      . hydrocortisone-pramoxine (ANALPRAM-HC) 2.5-1 % rectal cream Apply rectally twice daily as needed for hemorrhoids  30 g  0  . levothyroxine (SYNTHROID, LEVOTHROID) 112 MCG tablet Take 112 mcg by mouth daily before breakfast.      . losartan (COZAAR) 50 MG tablet       . magnesium oxide (MAG-OX) 400 (241.3 MG) MG tablet Take 1 tablet (400 mg total) by mouth daily.  30 tablet  5  . Multiple Vitamins-Minerals (MULTIVITAMINS THER. W/MINERALS) TABS Take 1 tablet by mouth at bedtime.          Marland Kitchen omeprazole (PRILOSEC) 40 MG capsule Take 40 mg by mouth as needed.       . propafenone (RYTHMOL SR) 325 MG 12 hr capsule Take 325 mg by mouth 2 (two) times daily.      . traZODone (DESYREL) 150 MG tablet Take 150 mg by mouth at bedtime.        . zaleplon (SONATA) 10 MG capsule Take 10 mg by mouth at bedtime.        Marland Kitchen estrogens, conjugated, (PREMARIN) 0.3 MG tablet Take 0.3 mg by mouth daily.        No current facility-administered medications for this visit.    History   Social History  . Marital Status: Married    Spouse Name: N/A    Number of Children: 3  . Years of Education: N/A   Occupational History  . retired    Social History Main Topics  . Smoking status: Never Smoker   . Smokeless tobacco: Never Used  . Alcohol Use: 3.6 oz/week    6 Glasses of wine per week     Comment: daily  . Drug Use: No  . Sexual Activity: Yes   Other Topics Concern  . Not on file   Social History Narrative   Lives in [place],  Former Real Chief Technology Officer,  Married    Family History  Problem Relation Age of Onset  . Anesthesia problems Mother   . Colon cancer Neg Hx   . Colon polyps Mother     beign    ROS is negative for fevers, chills or night sweats. She denies cough, wheezing or significant sputum production. She denies further episodes of dictation since the day of September 27 but she felt that she was back out of rhythm. She denies recent chest pain. She denies abdominal pain. She denies nausea vomiting or diarrhea. She denies hematuria or hematochezia. She denies claudication. She denies myalgias. She denies tremors. She denies significant change in weight.  Other comprehensive 12 point system review is negative.  PE BP 120/78  Pulse 64  Ht 5\' 4"  (1.626 m)  Wt 163 lb 12.8 oz (74.299 kg)  BMI 28.1 kg/m2  General: Alert, oriented, no distress.  Skin: normal turgor, no rashes HEENT: Normocephalic, atraumatic. Pupils round and reactive; sclera anicteric;no lid lag.   Nose without nasal septal hypertrophy Mouth/Parynx benign; Mallinpatti scale 2 Neck: No JVD, no carotid briuts Lungs: clear to ausculatation and percussion; no wheezing or rales Heart: RRR, s1 s2 normal 1/6 systolic murmur Abdomen: soft, nontender; no hepatosplenomehaly, BS+; abdominal aorta nontender  and not dilated by palpation. Pulses 2+ Extremities: no clubbing cyanosis or edema, Homan's sign negative  Neurologic: grossly nonfocal Psychologic: normal affect and mood.  ECG: Atrial paced rhythm at 64 beats per minute with ventricular sensing.  I did interrogate her pacemaker today. This reveals approximate 12 hour episode of atrial flutter lasting from 7:29 AM to 6:55 PM. The patient was symptomatic.  LABS:  BMET    Component Value Date/Time   NA 138 02/23/2013 1550   K 4.3 02/23/2013 1550   CL 106 02/23/2013 1550   CO2 27 02/23/2013 1550   GLUCOSE 95 02/23/2013 1550   BUN 22 02/23/2013 1550   CREATININE 1.05 02/23/2013 1550   CREATININE 0.69 10/09/2012 0453   CALCIUM 9.7 02/23/2013 1550   GFRNONAA 87* 10/09/2012 0453   GFRAA >90 10/09/2012 0453     Hepatic Function Panel     Component Value Date/Time   PROT 6.3 02/03/2013 1549   ALBUMIN 3.9 02/03/2013 1549   AST 26 02/03/2013 1549   ALT 14 02/03/2013 1549   ALKPHOS 36* 02/03/2013 1549   BILITOT 0.3 02/03/2013 1549   BILIDIR <0.1 04/01/2011 1147   IBILI NOT CALCULATED 04/01/2011 1147     CBC    Component Value Date/Time   WBC 3.4* 02/03/2013 1549   RBC 3.96 02/03/2013 1549   HGB 12.2 02/03/2013 1549   HCT 36.3 02/03/2013 1549   PLT 217 02/03/2013 1549   MCV 91.7 02/03/2013 1549   MCH 30.8 02/03/2013 1549   MCHC 33.6 02/03/2013 1549   RDW 14.5 02/03/2013 1549   LYMPHSABS 1.1 02/03/2013 1549   MONOABS 0.5 02/03/2013 1549   EOSABS 0.2 02/03/2013 1549   BASOSABS 0.0 02/03/2013 1549     BNP    Component Value Date/Time   PROBNP 125.0* 12/30/2006 0530    Lipid Panel     Component Value Date/Time   CHOL  Value: 172         ATP III CLASSIFICATION:  <200     mg/dL   Desirable  644-034  mg/dL   Borderline High  >=742    mg/dL   High 10/22/5636 7564   TRIG 293* 12/19/2006 0315   HDL 37* 12/19/2006 0315   CHOLHDL 4.6 12/19/2006 0315   VLDL 59* 12/19/2006 0315   LDLCALC  Value: 76        Total Cholesterol/HDL:CHD Risk Coronary Heart Disease Risk Table                     Men   Women  1/2 Average Risk   3.4   3.3 12/19/2006 0315     RADIOLOGY: No results found.    ASSESSMENT AND PLAN: Ms. Meshell Abdulaziz has a history of PAF and status post prior pacemaker first sinus node dysfunction. She is anticoagulated with her Pradaxa/. She did have a 12 hour episode of atrial tachycardia with a maximum rate of 176 beats per minute on 03/12/2013. She is on Propofenone SR 325 twice a day. She stopped taking her diltiazem since this did not make her feel well and she is on atenolol 50 mg. I have suggested that she can continue to take her atenolol up to 50 twice a day if necessary or just when necessary return episode of tachycardia dysrhythmia. Her blood pressure is controlled. Did review her recent laboratory from September 2014. Electrolytes and renal function were normal. Hemoglobin 12.2 hematocrit 36.3. I believe she is followed by Dr. Leslie Dales for her thyroid abnormality.  She prefers not to do home telephonic monitoring of her pacemaker, as such she will be seen in 4 months for followup evaluation which time her pacemaker will be reinterrogated.     [name], MD, [facility]  04/05/2013 7:58 AM

## 2013-04-20 ENCOUNTER — Other Ambulatory Visit: Payer: Self-pay | Admitting: *Deleted

## 2013-04-20 MED ORDER — LOSARTAN POTASSIUM 50 MG PO TABS: 50.0000 mg | ORAL_TABLET | Freq: Every day | ORAL | Status: DC

## 2013-04-20 NOTE — Telephone Encounter (Signed)
Rx was sent to pharmacy electronically. 

## 2013-05-10 ENCOUNTER — Other Ambulatory Visit: Payer: Self-pay | Admitting: Internal Medicine

## 2013-05-16 ENCOUNTER — Telehealth: Payer: Self-pay | Admitting: Internal Medicine

## 2013-05-16 NOTE — Telephone Encounter (Signed)
Patient states she has IBS and had problems over the holiday. She also has hemorrhoids and the diarrhea she had has caused bleeding. She is on a blood thinner also. She states the blood is dripping into toilet and down her legs when she goes to the bathroom. Offered OV in AM but she had rather have an afternoon OV. Scheduled with Mike Gip, PA tomorrow at 3:00 PM.

## 2013-05-16 NOTE — Telephone Encounter (Signed)
Left a message for patient to call back. 

## 2013-05-17 ENCOUNTER — Ambulatory Visit: Payer: Medicare Other | Admitting: Physician Assistant

## 2013-05-27 ENCOUNTER — Telehealth: Payer: Self-pay | Admitting: Cardiovascular Disease

## 2013-05-27 MED ORDER — PROPAFENONE HCL ER 325 MG PO CP12: 325.0000 mg | ORAL_CAPSULE | Freq: Two times a day (BID) | ORAL | Status: DC

## 2013-05-27 MED ORDER — DABIGATRAN ETEXILATE MESYLATE 150 MG PO CAPS: 150.0000 mg | ORAL_CAPSULE | Freq: Two times a day (BID) | ORAL | Status: DC

## 2013-05-27 NOTE — Telephone Encounter (Signed)
Would like a 90 day prescription written for her Pradaxa 150 mg capsules twice a day and Propasenone HCl ER.. 320 mg because it is less expensive for her to get the 90 day.Michae Kava on [address][phone].Marland KitchenPlease Call if you have any questions

## 2013-05-27 NOTE — Telephone Encounter (Signed)
Refill(s) sent to pharmacy for 90-day supplies.

## 2013-07-12 ENCOUNTER — Encounter: Payer: Self-pay | Admitting: *Deleted

## 2013-07-15 ENCOUNTER — Encounter: Payer: Medicare Other | Admitting: Cardiovascular Disease

## 2013-07-28 ENCOUNTER — Other Ambulatory Visit: Payer: Self-pay | Admitting: Cardiovascular Disease

## 2013-07-28 NOTE — Telephone Encounter (Signed)
Rx was sent to pharmacy electronically. 

## 2013-07-29 ENCOUNTER — Other Ambulatory Visit: Payer: Self-pay | Admitting: *Deleted

## 2013-07-29 MED ORDER — ATENOLOL 50 MG PO TABS: 50.0000 mg | ORAL_TABLET | Freq: Every day | ORAL | Status: DC

## 2013-08-03 ENCOUNTER — Ambulatory Visit: Payer: Medicare Other | Admitting: Cardiovascular Disease

## 2013-08-11 ENCOUNTER — Encounter: Payer: Medicare Other | Admitting: Cardiovascular Disease

## 2013-08-12 ENCOUNTER — Ambulatory Visit: Payer: Medicare Other | Admitting: Cardiovascular Disease

## 2013-09-01 ENCOUNTER — Encounter: Payer: Medicare Other | Admitting: Cardiovascular Disease

## 2013-09-05 ENCOUNTER — Encounter: Payer: Self-pay | Admitting: Cardiovascular Disease

## 2013-09-05 ENCOUNTER — Ambulatory Visit (INDEPENDENT_AMBULATORY_CARE_PROVIDER_SITE_OTHER): Payer: Medicare Other | Admitting: Cardiovascular Disease

## 2013-09-05 VITALS — BP 120/74 | HR 66 | Resp 16 | Ht 65.0 in | Wt 156.9 lb

## 2013-09-05 DIAGNOSIS — I495 Sick sinus syndrome: Secondary | ICD-10-CM

## 2013-09-05 DIAGNOSIS — I480 Paroxysmal atrial fibrillation: Secondary | ICD-10-CM

## 2013-09-05 DIAGNOSIS — R079 Chest pain, unspecified: Secondary | ICD-10-CM

## 2013-09-05 DIAGNOSIS — I4891 Unspecified atrial fibrillation: Secondary | ICD-10-CM

## 2013-09-05 LAB — MDC_IDC_ENUM_SESS_TYPE_INCLINIC
Battery Voltage: 3.02 V
Brady Statistic RA Percent Paced: 37.5 %
Brady Statistic RV Percent Paced: 0 %
Lead Channel Impedance Value: 456 Ohm
Lead Channel Pacing Threshold Amplitude: 1 V
Lead Channel Pacing Threshold Pulse Width: 0.4 ms
Lead Channel Sensing Intrinsic Amplitude: 2.2 mV
Lead Channel Sensing Intrinsic Amplitude: 9.7 mV
Lead Channel Setting Pacing Amplitude: 2 V
Lead Channel Setting Pacing Pulse Width: 0.4 ms
MDC IDC MSMT LEADCHNL RV IMPEDANCE VALUE: 424 Ohm
MDC IDC MSMT LEADCHNL RV PACING THRESHOLD AMPLITUDE: 1 V
MDC IDC MSMT LEADCHNL RV PACING THRESHOLD PULSEWIDTH: 0.4 ms
MDC IDC SET LEADCHNL RA PACING AMPLITUDE: 2 V
MDC IDC SET LEADCHNL RV SENSING SENSITIVITY: 0.9 mV

## 2013-09-05 LAB — PACEMAKER DEVICE OBSERVATION

## 2013-09-05 NOTE — Patient Instructions (Signed)
Your physician recommends that you schedule a follow-up appointment in: With Shakila in 3 months.  Your physician recommends that you schedule a follow-up appointment in: One year with Dr. Royann Shivers.

## 2013-09-09 ENCOUNTER — Encounter: Payer: Self-pay | Admitting: Cardiovascular Disease

## 2013-09-09 NOTE — Progress Notes (Signed)
Patient ID: Jacqueline Kim, female   DOB: 07-19-1945, 68 y.o.   MRN: [id number]     Reason for office visit Sinus node dysfunction, atrial fibrillation, dual chamber pacemaker followup  Jacqueline Kim has no complaints at this time. She has not had any clinical evidence of atrial fibrillation and interrogation of her device confirms no episodes since last September. Reduce frequency of atrial fibrillation is noticed since she stopped taking diltiazem. She has not had syncope, dizziness, shortness of breath or angina. She denies ankle swelling.  Allergies  Allergen Reactions  . Penicillins Anaphylaxis  . Epinephrine Other (See Comments)    Causes A-Fib  . Metoprolol     alopecia  . Lidocaine Palpitations    Or any similar medication    Current Outpatient Prescriptions  Medication Sig Dispense Refill  . atenolol (TENORMIN) 50 MG tablet Take 1 tablet (50 mg total) by mouth daily.  90 tablet  2  . Biotin 10 MG TABS Take 1 tablet by mouth daily.       . CELEBREX 200 MG capsule as needed.       . conjugated estrogens (PREMARIN) vaginal cream Place 1.5 g vaginally daily as needed.       . dabigatran (PRADAXA) 150 MG CAPS capsule Take 1 capsule (150 mg total) by mouth every 12 (twelve) hours.  180 capsule  2  . dicyclomine (BENTYL) 10 MG capsule TAKE ONE CAPSULE TWICE A DAY  60 capsule  2  . escitalopram (LEXAPRO) 20 MG tablet Take 20 mg by mouth at bedtime.       Marland Kitchen estrogens, conjugated, (PREMARIN) 0.3 MG tablet Take 0.3 mg by mouth daily.       . hydrocodone-acetaminophen (LORCET-HD) 5-500 MG per capsule Take 1 capsule by mouth every 6 (six) hours as needed. For pain      . hydrocortisone-pramoxine (ANALPRAM-HC) 2.5-1 % rectal cream Apply rectally twice daily as needed for hemorrhoids  30 g  0  . levothyroxine (SYNTHROID, LEVOTHROID) 112 MCG tablet Take 112 mcg by mouth daily before breakfast.      . losartan (COZAAR) 50 MG tablet Take 1 tablet (50 mg total) by mouth daily.  90 tablet  3  .  magnesium oxide (MAG-OX) 400 (241.3 MG) MG tablet Take 1 tablet (400 mg total) by mouth daily.  30 tablet  5  . Multiple Vitamins-Minerals (MULTIVITAMINS THER. W/MINERALS) TABS Take 1 tablet by mouth at bedtime.        . propafenone (RYTHMOL SR) 325 MG 12 hr capsule Take 1 capsule (325 mg total) by mouth 2 (two) times daily.  180 capsule  2  . traZODone (DESYREL) 150 MG tablet Take 150 mg by mouth at bedtime.        . zaleplon (SONATA) 10 MG capsule Take 10 mg by mouth at bedtime.         No current facility-administered medications for this visit.    Past Medical History  Diagnosis Date  . Paroxysmal a-fib diagnosed 2008  . 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 Hypothyroid 05/27/2011  . Pacemaker Medtronic  (REVO) placed10/23/12 04/08/11  . Anxiety   . Hypertension   . Glaucoma   . Complication of anesthesia     DIFFICULTY WAKING UP  . Shortness of breath     AF LAST 3 EPI1SODES SINCE AUG  . Arthritis   . Headache(784.0)     OCCASIONALLY  . Anticoagulant long-term use  WITH PRADAXA 05/27/2011  .  DJD (degenerative joint disease)   . GERD (gastroesophageal reflux disease)   . Hypothyroidism   . IBS (irritable bowel syndrome)   . Depression   . Diverticulosis   . External hemorrhoids   . Esophagitis 09/17/06    Past Surgical History  Procedure Laterality Date  . Pacemaker insertion  04/08/11    MDT Revo implanted by Dr Rubie Maid  . Eye surgery      CATERACTS  . Abdominal hysterectomy    . Liposuction    . Ovarian cyst removal    . Foot surgery    . Hand surgery      multiple  . Breast reduction surgery    . Facial cosmetic surgery    . Tonsillectomy    . US echocardiography  10/01/2011    LA mild-mod dilated,borderline RA enlargement,mild MR,trace TR & PI  . Nm myocar perf wall motion  12/29/2006    No significant ischemia; EF 69%    Family History  Problem Relation Age of Onset  . Anesthesia problems Mother   . Colon cancer Neg  Hx   . Colon polyps Mother     beign    History   Social History  . Marital Status: Married    Spouse Name: N/A    Number of Children: 3  . Years of Education: N/A   Occupational History  . retired    Social History Main Topics  . Smoking status: Never Smoker   . Smokeless tobacco: Never Used  . Alcohol Use: 3.6 oz/week    6 Glasses of wine per week     Comment: daily  . Drug Use: No  . Sexual Activity: Yes   Other Topics Concern  . Not on file   Social History Narrative   Lives in [place],  Former Engineer, civil (consulting),  Married    Review of systems: The patient specifically denies any chest pain at rest or with exertion, dyspnea at rest or with exertion, orthopnea, paroxysmal nocturnal dyspnea, syncope, palpitations, focal neurological deficits, intermittent claudication, lower extremity edema, unexplained weight gain, cough, hemoptysis or wheezing.  The patient also denies abdominal pain, nausea, vomiting, dysphagia, diarrhea, constipation, polyuria, polydipsia, dysuria, hematuria, frequency, urgency, abnormal bleeding or bruising, fever, chills, unexpected weight changes, mood swings, change in skin or hair texture, change in voice quality, auditory or visual problems, allergic reactions or rashes, new musculoskeletal complaints other than usual "aches and pains".   PHYSICAL EXAM BP 120/74  Pulse 66  Resp 16  Ht 5\' 5"  (1.651 m)  Wt 71.169 kg (156 lb 14.4 oz)  BMI 26.11 kg/m2  General: Alert, oriented x3, no distress Head: no evidence of trauma, PERRL, EOMI, no exophtalmos or lid lag, no myxedema, no xanthelasma; normal ears, nose and oropharynx Neck: normal jugular venous pulsations and no hepatojugular reflux; brisk carotid pulses without delay and no carotid bruits Chest: clear to auscultation, no signs of consolidation by percussion or palpation, normal fremitus, symmetrical and full respiratory excursions, healthy left subclavian pacemaker  site Cardiovascular: normal position and quality of the apical impulse, regular rhythm, normal first and second heart sounds, no murmurs, rubs or gallops Abdomen: no tenderness or distention, no masses by palpation, no abnormal pulsatility or arterial bruits, normal bowel sounds, no hepatosplenomegaly Extremities: no clubbing, cyanosis or edema; 2+ radial, ulnar and brachial pulses bilaterally; 2+ right femoral, posterior tibial and dorsalis pedis pulses; 2+ left femoral, posterior tibial and dorsalis pedis pulses; no subclavian or femoral bruits Neurological: grossly nonfocal  EKG: atrial paced ventricular sensed  Lipid Panel     Component Value Date/Time   CHOL  Value: 172        ATP III CLASSIFICATION:  <200     mg/dL   Desirable  284-132  mg/dL   Borderline High  >=440    mg/dL   High 1/0/2725 3664   TRIG 293* 12/19/2006 0315   HDL 37* 12/19/2006 0315   CHOLHDL 4.6 12/19/2006 0315   VLDL 59* 12/19/2006 0315   LDLCALC  Value: 76        Total Cholesterol/HDL:CHD Risk Coronary Heart Disease Risk Table                     Men   Women  1/2 Average Risk   3.4   3.3 12/19/2006 0315    BMET    Component Value Date/Time   NA 138 02/23/2013 1550   K 4.3 02/23/2013 1550   CL 106 02/23/2013 1550   CO2 27 02/23/2013 1550   GLUCOSE 95 02/23/2013 1550   BUN 22 02/23/2013 1550   CREATININE 1.05 02/23/2013 1550   CREATININE 0.69 10/09/2012 0453   CALCIUM 9.7 02/23/2013 1550   GFRNONAA 87* 10/09/2012 0453   GFRAA >90 10/09/2012 0453     ASSESSMENT AND PLAN  Normal dual chamber permanent pacemaker function. Very infrequent episodes of atrial fibrillation without documented recurrence in 6 months. Encouraged her to consider remote pacemaker monitoring but she prefers to come to the office every 3 months. I gently pointed out that she has missed a few appointments. She might actually find remote monitoring easier to comply with. Her last pacemaker check was last October.  She has been paying a large sum of money  for anticoagulant ($130 for Pradaxa). She definitely does not want to switch to Coumadin. I gave her the names and doses of alternative agents that may be less expensive.  Orders Placed This Encounter  Procedures  . EKG 12-Lead   No orders of the defined types were placed in this encounter.    Junious Silk, MD, [facility] [phone] office [phone] pager

## 2013-09-17 ENCOUNTER — Observation Stay (HOSPITAL_COMMUNITY)
Admission: EM | Admit: 2013-09-17 | Discharge: 2013-09-19 | Disposition: A | Discharge status: Home / Self Care | Payer: Medicare Other | Attending: Cardiology | Admitting: Cardiology

## 2013-09-17 ENCOUNTER — Other Ambulatory Visit: Payer: Self-pay

## 2013-09-17 ENCOUNTER — Encounter (HOSPITAL_COMMUNITY): Payer: Self-pay | Admitting: Emergency Medicine

## 2013-09-17 ENCOUNTER — Emergency Department (HOSPITAL_COMMUNITY): Payer: Medicare Other

## 2013-09-17 DIAGNOSIS — R0602 Shortness of breath: Secondary | ICD-10-CM | POA: Insufficient documentation

## 2013-09-17 DIAGNOSIS — F3289 Other specified depressive episodes: Secondary | ICD-10-CM | POA: Insufficient documentation

## 2013-09-17 DIAGNOSIS — I480 Paroxysmal atrial fibrillation: Secondary | ICD-10-CM

## 2013-09-17 DIAGNOSIS — K219 Gastro-esophageal reflux disease without esophagitis: Secondary | ICD-10-CM | POA: Insufficient documentation

## 2013-09-17 DIAGNOSIS — E039 Hypothyroidism, unspecified: Secondary | ICD-10-CM | POA: Insufficient documentation

## 2013-09-17 DIAGNOSIS — K644 Residual hemorrhoidal skin tags: Secondary | ICD-10-CM | POA: Insufficient documentation

## 2013-09-17 DIAGNOSIS — M1990 Unspecified osteoarthritis, unspecified site: Secondary | ICD-10-CM | POA: Insufficient documentation

## 2013-09-17 DIAGNOSIS — R079 Chest pain, unspecified: Secondary | ICD-10-CM

## 2013-09-17 DIAGNOSIS — Z950 Presence of cardiac pacemaker: Secondary | ICD-10-CM | POA: Diagnosis present

## 2013-09-17 DIAGNOSIS — I495 Sick sinus syndrome: Secondary | ICD-10-CM | POA: Insufficient documentation

## 2013-09-17 DIAGNOSIS — K5730 Diverticulosis of large intestine without perforation or abscess without bleeding: Secondary | ICD-10-CM | POA: Insufficient documentation

## 2013-09-17 DIAGNOSIS — F329 Major depressive disorder, single episode, unspecified: Secondary | ICD-10-CM | POA: Insufficient documentation

## 2013-09-17 DIAGNOSIS — F411 Generalized anxiety disorder: Secondary | ICD-10-CM | POA: Insufficient documentation

## 2013-09-17 DIAGNOSIS — H409 Unspecified glaucoma: Secondary | ICD-10-CM | POA: Insufficient documentation

## 2013-09-17 DIAGNOSIS — I10 Essential (primary) hypertension: Secondary | ICD-10-CM | POA: Insufficient documentation

## 2013-09-17 DIAGNOSIS — I4891 Unspecified atrial fibrillation: Secondary | ICD-10-CM

## 2013-09-17 DIAGNOSIS — K209 Esophagitis, unspecified without bleeding: Secondary | ICD-10-CM | POA: Insufficient documentation

## 2013-09-17 DIAGNOSIS — Z87891 Personal history of nicotine dependence: Secondary | ICD-10-CM | POA: Insufficient documentation

## 2013-09-17 DIAGNOSIS — Z7901 Long term (current) use of anticoagulants: Secondary | ICD-10-CM

## 2013-09-17 DIAGNOSIS — K589 Irritable bowel syndrome without diarrhea: Secondary | ICD-10-CM | POA: Insufficient documentation

## 2013-09-17 DIAGNOSIS — F419 Anxiety disorder, unspecified: Secondary | ICD-10-CM

## 2013-09-17 HISTORY — DX: Personal history of other medical treatment: Z92.89

## 2013-09-17 LAB — BASIC METABOLIC PANEL
BUN: 21 mg/dL (ref 6–23)
CHLORIDE: 98 meq/L (ref 96–112)
CO2: 22 meq/L (ref 19–32)
Calcium: 9.6 mg/dL (ref 8.4–10.5)
Creatinine, Ser: 0.99 mg/dL (ref 0.50–1.10)
GFR calc Af Amer: 65 mL/min — ABNORMAL LOW (ref >90)
GFR calc non Af Amer: 56 mL/min — ABNORMAL LOW (ref >90)
Glucose, Bld: 85 mg/dL (ref 70–99)
Potassium: 4.1 mEq/L (ref 3.7–5.3)
SODIUM: 139 meq/L (ref 137–147)

## 2013-09-17 LAB — CBC
HEMATOCRIT: 36.9 % (ref 36.0–46.0)
Hemoglobin: 12.4 g/dL (ref 12.0–15.0)
MCH: 32 pg (ref 26.0–34.0)
MCHC: 33.6 g/dL (ref 30.0–36.0)
MCV: 95.3 fL (ref 78.0–100.0)
PLATELETS: 234 10*3/uL (ref 150–400)
RBC: 3.87 MIL/uL (ref 3.87–5.11)
RDW: 13.3 % (ref 11.5–15.5)
WBC: 6.6 10*3/uL (ref 4.0–10.5)

## 2013-09-17 LAB — I-STAT TROPONIN, ED: Troponin i, poc: 0 ng/mL (ref 0.00–0.08)

## 2013-09-17 MED ORDER — GI COCKTAIL ~~LOC~~
30.0000 mL | Freq: Once | ORAL | Status: AC
  Administered 2013-09-17: 30 mL via ORAL
  Filled 2013-09-17: qty 30

## 2013-09-17 MED ORDER — ASPIRIN 325 MG PO TABS
325.0000 mg | ORAL_TABLET | Freq: Once | ORAL | Status: AC
  Administered 2013-09-17: 325 mg via ORAL
  Filled 2013-09-17: qty 1

## 2013-09-17 MED ORDER — DILTIAZEM HCL 25 MG/5ML IV SOLN
20.0000 mg | Freq: Once | INTRAVENOUS | Status: AC
  Administered 2013-09-17: 20 mg via INTRAVENOUS
  Filled 2013-09-17: qty 5

## 2013-09-17 MED ORDER — DILTIAZEM HCL 100 MG IV SOLR
5.0000 mg/h | Freq: Once | INTRAVENOUS | Status: AC
  Administered 2013-09-17: 5 mg/h via INTRAVENOUS

## 2013-09-17 NOTE — ED Notes (Addendum)
Pt. reports mid chest pain with palpitations onset this evening with SOB , denies nausea or diaphoresis . Pt. stated history of Atrial Fibrillation / pacemaker - her cardiologist is Dr. Bishop Limbo .

## 2013-09-17 NOTE — ED Provider Notes (Signed)
CSN: [id number]     Arrival date & time 09/17/13  1924 History   First MD Initiated Contact with Patient 09/17/13 2021     Chief Complaint  Patient presents with  . Chest Pain  . Palpitations     (Consider location/radiation/quality/duration/timing/severity/associated sxs/prior Treatment) Patient is a 68 y.o. female presenting with chest pain. The history is provided by the patient. No language interpreter was used.  Chest Pain Pain location:  Substernal area Pain quality: pressure   Pain radiates to:  L shoulder, R shoulder and upper back Pain radiates to the back: yes   Pain severity:  Moderate Onset quality:  Sudden Duration:  2 hours Timing:  Constant Progression:  Unchanged Chronicity:  Recurrent Context: at rest   Relieved by:  Nothing Worsened by:  Nothing tried Ineffective treatments:  None tried Associated symptoms: anxiety, back pain and palpitations   Associated symptoms: no abdominal pain, no anorexia, no cough, no diaphoresis, no dizziness, no fatigue, no fever, no headache, no lower extremity edema, no nausea, no numbness, no shortness of breath, not vomiting and no weakness   Risk factors: hypertension   Risk factors comment:  Tachy-brady syndrome, parox afib   Past Medical History  Diagnosis Date  . Paroxysmal a-fib diagnosed 2008  . 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 Hypothyroid 05/27/2011  . Pacemaker [employer]  (REVO) placed10/23/12 04/08/11  . Anxiety   . Hypertension   . Glaucoma   . Complication of anesthesia     DIFFICULTY WAKING UP  . Shortness of breath     AF LAST 3 [id number] SINCE AUG  . Arthritis   . Headache(784.0)     OCCASIONALLY  . Anticoagulant long-term use  WITH PRADAXA 05/27/2011  . DJD (degenerative joint disease)   . GERD (gastroesophageal reflux disease)   . Hypothyroidism   . IBS (irritable bowel syndrome)   . Depression   . Diverticulosis   . External hemorrhoids   . Esophagitis  09/17/06   Past Surgical History  Procedure Laterality Date  . Pacemaker insertion  04/08/11    MDT Revo implanted by Dr Rubie Maid  . Eye surgery      CATERACTS  . Abdominal hysterectomy    . Liposuction    . Ovarian cyst removal    . Foot surgery    . Hand surgery      multiple  . Breast reduction surgery    . Facial cosmetic surgery    . Tonsillectomy    . US echocardiography  10/01/2011    LA mild-mod dilated,borderline RA enlargement,mild MR,trace TR & PI  . Nm myocar perf wall motion  12/29/2006    No significant ischemia; EF 69%   Family History  Problem Relation Age of Onset  . Anesthesia problems Mother   . Colon cancer Neg Hx   . Colon polyps Mother     beign   History  Substance Use Topics  . Smoking status: Never Smoker   . Smokeless tobacco: Never Used  . Alcohol Use: 3.6 oz/week    6 Glasses of wine per week     Comment: daily   OB History   Grav Para Term Preterm Abortions TAB SAB Ect Mult Living                 Review of Systems  Constitutional: Negative for fever, chills, diaphoresis, activity change, appetite change and fatigue.  HENT: Negative for congestion, facial swelling, rhinorrhea and sore throat.  Eyes: Negative for photophobia and discharge.  Respiratory: Negative for cough, chest tightness and shortness of breath.   Cardiovascular: Positive for chest pain and palpitations. Negative for leg swelling.  Gastrointestinal: Negative for nausea, vomiting, abdominal pain, diarrhea and anorexia.  Endocrine: Negative for polydipsia and polyuria.  Genitourinary: Negative for dysuria, frequency, difficulty urinating and pelvic pain.  Musculoskeletal: Positive for back pain. Negative for arthralgias, neck pain and neck stiffness.  Skin: Negative for color change and wound.  Allergic/Immunologic: Negative for immunocompromised state.  Neurological: Negative for dizziness, facial asymmetry, weakness, numbness and headaches.  Hematological: Does not  bruise/bleed easily.  Psychiatric/Behavioral: Negative for confusion and agitation.      Allergies  Penicillins; Epinephrine; Metoprolol; and Lidocaine  Home Medications   Current Outpatient Rx  Name  Route  Sig  Dispense  Refill  . atenolol (TENORMIN) 50 MG tablet   Oral   Take 1 tablet (50 mg total) by mouth daily.   90 tablet   2   . Biotin 10 MG TABS   Oral   Take 1 tablet by mouth daily.          . CELEBREX 200 MG capsule      as needed.          . conjugated estrogens (PREMARIN) vaginal cream   Vaginal   Place 1.5 g vaginally daily as needed.          . dabigatran (PRADAXA) 150 MG CAPS capsule   Oral   Take 1 capsule (150 mg total) by mouth every 12 (twelve) hours.   180 capsule   2   . dicyclomine (BENTYL) 10 MG capsule      TAKE ONE CAPSULE TWICE A DAY   60 capsule   2   . escitalopram (LEXAPRO) 20 MG tablet   Oral   Take 20 mg by mouth at bedtime.          Marland Kitchen estrogens, conjugated, (PREMARIN) 0.3 MG tablet   Oral   Take 0.3 mg by mouth daily.          . hydrocodone-acetaminophen (LORCET-HD) 5-500 MG per capsule   Oral   Take 1 capsule by mouth every 6 (six) hours as needed. For pain         . hydrocortisone-pramoxine (ANALPRAM-HC) 2.5-1 % rectal cream      Apply rectally twice daily as needed for hemorrhoids   30 g   0   . levothyroxine (SYNTHROID, LEVOTHROID) 112 MCG tablet   Oral   Take 112 mcg by mouth daily before breakfast.         . losartan (COZAAR) 50 MG tablet   Oral   Take 1 tablet (50 mg total) by mouth daily.   90 tablet   3   . magnesium oxide (MAG-OX) 400 (241.3 MG) MG tablet   Oral   Take 1 tablet (400 mg total) by mouth daily.   30 tablet   5   . Multiple Vitamins-Minerals (MULTIVITAMINS THER. W/MINERALS) TABS   Oral   Take 1 tablet by mouth at bedtime.           . propafenone (RYTHMOL SR) 325 MG 12 hr capsule   Oral   Take 1 capsule (325 mg total) by mouth 2 (two) times daily.   180 capsule    2   . traZODone (DESYREL) 150 MG tablet   Oral   Take 150 mg by mouth at bedtime.           Marland Kitchen  zaleplon (SONATA) 10 MG capsule   Oral   Take 10 mg by mouth at bedtime.            BP 142/79  Pulse 153  Temp(Src) 98.8 F (37.1 C) (Oral)  Resp 18  Ht 5\' 4"  (1.626 m)  Wt 152 lb (68.947 kg)  BMI 26.08 kg/m2  SpO2 97% Physical Exam  Constitutional: She is oriented to person, place, and time. She appears well-developed and well-nourished. No distress.  HENT:  Head: Normocephalic and atraumatic.  Mouth/Throat: No oropharyngeal exudate.  Eyes: Pupils are equal, round, and reactive to light.  Neck: Normal range of motion. Neck supple.  Cardiovascular: Normal heart sounds.  An irregularly irregular rhythm present. Tachycardia present.  Exam reveals no gallop and no friction rub.   No murmur heard. Pulmonary/Chest: Effort normal and breath sounds normal. No respiratory distress. She has no wheezes. She has no rales.  Abdominal: Soft. Bowel sounds are normal. She exhibits no distension and no mass. There is no tenderness. There is no rebound and no guarding.  Musculoskeletal: Normal range of motion. She exhibits no edema and no tenderness.  Neurological: She is alert and oriented to person, place, and time.  Skin: Skin is warm and dry.  Psychiatric: She has a normal mood and affect.    ED Course  Procedures (including critical care time) Labs Review Labs Reviewed  CBC  PRO B NATRIURETIC PEPTIDE  BASIC METABOLIC PANEL  I-STAT TROPOININ, ED  I-STAT TROPOININ, ED   Imaging Review No results found.   EKG Interpretation None      Date: 09/17/2013  Rate: 154  Rhythm: atrial fibrillation  QRS Axis: normal  Intervals: indeterminate PR due to a fib  ST/T Wave abnormalities: nonspecific ST/T changes  Conduction Disutrbances:a fib  Narrative Interpretation:   Old EKG Reviewed: a fib has replaced atrial paced rhythm.    MDM   Final diagnoses:  Atrial fibrillation with  rapid ventricular response    Pt is a 68 y.o. female with Pmhx as above including tachy-brady syndrome, paroxysmal afib, who presents with CP described as central chest tightness and palpitaitons starting suddenly at 6:45PM tonight, c/w prior episodes of paroxysmal afib. On PE, pt in afib w/ RVR HR 130-150's, BP stable, nml mentation. Lungs clear, no LE edema. ASA, 20-mg IV dilt to be given.   Pt had brief response to dilt, then HR back up to 120-130's.  Dilt gtt started, titrated up to 10.  Cardiology consulted for admission. CP has since resolved. First trop neg, CXR nml.         64, MD 09/18/13 [phone]

## 2013-09-17 NOTE — H&P (Signed)
CARDIOLOGY ADMISSION NOTE  Patient ID: Jacqueline Kim MRN: [id number] DOB/AGE: [birth year] 70 y.o.  Admit date: 09/17/2013 Primary Physician   Thayer Headings, MD Primary Cardiologist   Dr. Harlin Heys. [name] Chief Complaint    Palpitations and chest pain  HPI:  The patient presented to the ED with palpitations starting about 6:45 this evening.  This was her usual atrial fibrillation with rapid heart rate.  However, this time she had chest pain.  She has not had this symptom before.  She reports 5/10 mid sternal discomfort with no radiation.  It was heavy.  She did have some dyspnea.  She had no radiation to her jaw or her arms.  She has never had this kind of discomfort.  She was essentially pain free after presenting to the Ed. Upon presentation the rhythm was atrial fib with rate in the 150s.  In the ED the troponin was not elevated.  EKG shows no acute ST T wave changes.  She did exert herself today carrying groceries up a flight of stairs.  However, she has not had any symptoms this this kind of activity other than some mild DOE.  She does not have PND or orthopnea.  She has not had palpitations since last fall and has had no syncope or presyncope.  She has had some diarrhea recently which she ascribes to something she has eaten.    Past Medical History  Diagnosis Date  . Paroxysmal a-fib diagnosed 2008  . 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 Hypothyroid 05/27/2011  . Anxiety   . Hypertension   . Glaucoma   . Complication of anesthesia     DIFFICULTY WAKING UP  . Arthritis   . Anticoagulant long-term use  WITH PRADAXA 05/27/2011  . DJD (degenerative joint disease)   . GERD (gastroesophageal reflux disease)   . IBS (irritable bowel syndrome)   . Depression   . Diverticulosis   . External hemorrhoids   . Esophagitis 09/17/06    Past Surgical History  Procedure Laterality Date  . Pacemaker insertion  04/08/11    MDT Revo implanted by Dr  Rubie Maid  . Eye surgery      cataracts  . Abdominal hysterectomy    . Liposuction    . Ovarian cyst removal    . Foot surgery    . Hand surgery      multiple  . Breast reduction surgery    . Facial cosmetic surgery    . Tonsillectomy    . Nm myocar perf wall motion  12/29/2006    No significant ischemia; EF 69%    Allergies  Allergen Reactions  . Penicillins Anaphylaxis  . Epinephrine Other (See Comments)    Causes A-Fib  . Metoprolol     alopecia  . Lidocaine Palpitations    Or any similar medication   No current facility-administered medications on file prior to encounter.   Current Outpatient Prescriptions on File Prior to Encounter  Medication Sig Dispense Refill  . atenolol (TENORMIN) 50 MG tablet Take 1 tablet (50 mg total) by mouth daily.  90 tablet  2  . Biotin 10 MG TABS Take 1 tablet by mouth daily.       . CELEBREX 200 MG capsule Take 200 mg by mouth as needed.       . conjugated estrogens (PREMARIN) vaginal cream Place 1.5 g vaginally daily as needed.       . dabigatran (PRADAXA) 150 MG CAPS capsule Take  1 capsule (150 mg total) by mouth every 12 (twelve) hours.  180 capsule  2  . dicyclomine (BENTYL) 10 MG capsule TAKE ONE CAPSULE TWICE A DAY  60 capsule  2  . escitalopram (LEXAPRO) 20 MG tablet Take 20 mg by mouth at bedtime.       Marland Kitchen estrogens, conjugated, (PREMARIN) 0.3 MG tablet Take 0.3 mg by mouth daily.       Marland Kitchen levothyroxine (SYNTHROID, LEVOTHROID) 112 MCG tablet Take 112 mcg by mouth daily before breakfast.      . losartan (COZAAR) 50 MG tablet Take 1 tablet (50 mg total) by mouth daily.  90 tablet  3  . magnesium oxide (MAG-OX) 400 (241.3 MG) MG tablet Take 1 tablet (400 mg total) by mouth daily.  30 tablet  5  . Multiple Vitamins-Minerals (MULTIVITAMINS THER. W/MINERALS) TABS Take 1 tablet by mouth at bedtime.        . propafenone (RYTHMOL SR) 325 MG 12 hr capsule Take 1 capsule (325 mg total) by mouth 2 (two) times daily.  180 capsule  2  . traZODone  (DESYREL) 150 MG tablet Take 150 mg by mouth at bedtime.        . zaleplon (SONATA) 10 MG capsule Take 10 mg by mouth at bedtime.         History   Social History  . Marital Status: Married    Spouse Name: N/A    Number of Children: 3  . Years of Education: N/A   Occupational History  . retired    Social History Main Topics  . Smoking status: Former Games developer  . Smokeless tobacco: Never Used     Comment: Quit 15 years ago  . Alcohol Use: 3.6 oz/week    6 Glasses of wine per week     Comment: daily  . Drug Use: No  . Sexual Activity: Yes   Other Topics Concern  . Not on file   Social History Narrative   Lives in [place],  Former [employer],  Separated.      Family History  Problem Relation Age of Onset  . Anesthesia problems Mother   . Colon cancer Neg Hx   . Colon polyps Mother     beign     ROS:  Diahrea and bleeding hemorrhoids.  Otherwise as stated in the HPI and negative for all other systems.  Physical Exam: Blood pressure 123/79, pulse 107, temperature 98.8 F (37.1 C), temperature source Oral, resp. rate 19, height 5\' 4"  (1.626 m), weight 152 lb (68.947 kg), SpO2 95.00%.  GENERAL:  Well appearing HEENT:  Pupils equal round and reactive, fundi not visualized, oral mucosa unremarkable NECK:  No jugular venous distention, waveform within normal limits, carotid upstroke brisk and symmetric, no bruits, no thyromegaly LYMPHATICS:  No cervical, inguinal adenopathy LUNGS:  Clear to auscultation bilaterally BACK:  No CVA tenderness CHEST:  Unremarkable HEART:  PMI not displaced or sustained,S1 and S2 within normal limits, no S3,  no clicks, no rubs, no murmurs, irregular ABD:  Flat, positive bowel sounds normal in frequency in pitch, no bruits, no rebound, no guarding, no midline pulsatile mass, no hepatomegaly, no splenomegaly EXT:  2 plus pulses throughout, no edema, no cyanosis no clubbing SKIN:  No rashes no nodules NEURO:  Cranial nerves II through  XII grossly intact, motor grossly intact throughout PSYCH:  Cognitively intact, oriented to person place and time  Labs: Lab Results  Component Value Date   BUN 21 09/17/2013  Lab Results  Component Value Date   CREATININE 0.99 09/17/2013   Lab Results  Component Value Date   NA 139 09/17/2013   K 4.1 09/17/2013   CL 98 09/17/2013   CO2 22 09/17/2013    Lab Results  Component Value Date   WBC 6.6 09/17/2013   HGB 12.4 09/17/2013   HCT 36.9 09/17/2013   MCV 95.3 09/17/2013   PLT 234 09/17/2013    Radiology:  CXR:  Lungs are clear. Heart size and pulmonary vascularity are normal. No  adenopathy. Pacemaker leads are attached to the right atrium and  right ventricle. No pneumothorax. There is thoracolumbar  levoscoliosis.    EKG:  Atrial fib, rate 154, possible old anteroseptal MI, QT borderline prolonged.  No acute ST T wave changes.    ASSESSMENT AND PLAN:    ATRIAL FIB: I will observe the patient overnight on IV diltiazem.  I suspect that she will convert to NSR.  It is unlikely that she will need DCCV.  For now she will continue her previous meds.  Of note she has been taken off of Cardizem to avoid over utilization of her pacemaker by her report.   CHEST PAIN:  Cycle enzymes.  If she has negative enzymes and no further pain I would not suggest that further ischemia work up would be indicated.    SignedRollene Rotunda 09/18/2013, 12:10 AM

## 2013-09-17 NOTE — ED Notes (Signed)
Dr Micheline Maze at the bedside updating the pt.

## 2013-09-18 ENCOUNTER — Encounter (HOSPITAL_COMMUNITY): Payer: Self-pay

## 2013-09-18 DIAGNOSIS — I4891 Unspecified atrial fibrillation: Secondary | ICD-10-CM | POA: Diagnosis present

## 2013-09-18 LAB — TROPONIN I
Troponin I: <0.3 ng/mL (ref <0.30)
Troponin I: <0.3 ng/mL (ref <0.30)
Troponin I: <0.3 ng/mL (ref <0.30)

## 2013-09-18 LAB — T4, FREE: Free T4: 1.2 ng/dL (ref 0.80–1.80)

## 2013-09-18 LAB — TSH: TSH: 4.26 u[IU]/mL (ref 0.350–4.500)

## 2013-09-18 LAB — PRO B NATRIURETIC PEPTIDE: Pro B Natriuretic peptide (BNP): 1110 pg/mL — ABNORMAL HIGH (ref 0–125)

## 2013-09-18 MED ORDER — LEVOTHYROXINE SODIUM 112 MCG PO TABS
112.0000 ug | ORAL_TABLET | Freq: Every day | ORAL | Status: DC
  Administered 2013-09-18 – 2013-09-19 (×2): 112 ug via ORAL
  Filled 2013-09-18 (×3): qty 1

## 2013-09-18 MED ORDER — ATENOLOL 50 MG PO TABS
50.0000 mg | ORAL_TABLET | Freq: Every day | ORAL | Status: DC
  Administered 2013-09-18: 50 mg via ORAL
  Filled 2013-09-18 (×2): qty 1

## 2013-09-18 MED ORDER — DILTIAZEM HCL 100 MG IV SOLR
5.0000 mg/h | INTRAVENOUS | Status: DC
  Administered 2013-09-18: 15 mg/h via INTRAVENOUS
  Filled 2013-09-18: qty 100

## 2013-09-18 MED ORDER — DILTIAZEM HCL 60 MG PO TABS
60.0000 mg | ORAL_TABLET | Freq: Four times a day (QID) | ORAL | Status: DC
  Administered 2013-09-18 – 2013-09-19 (×4): 60 mg via ORAL
  Filled 2013-09-18 (×8): qty 1

## 2013-09-18 MED ORDER — LOSARTAN POTASSIUM 50 MG PO TABS
50.0000 mg | ORAL_TABLET | Freq: Every day | ORAL | Status: DC
  Filled 2013-09-18: qty 1

## 2013-09-18 MED ORDER — MAGNESIUM OXIDE 400 (241.3 MG) MG PO TABS
400.0000 mg | ORAL_TABLET | Freq: Every day | ORAL | Status: DC
  Administered 2013-09-18: 400 mg via ORAL
  Filled 2013-09-18 (×2): qty 1

## 2013-09-18 MED ORDER — PROPAFENONE HCL ER 325 MG PO CP12: 325.0000 mg | ORAL_CAPSULE | Freq: Two times a day (BID) | ORAL | Status: DC

## 2013-09-18 MED ORDER — DABIGATRAN ETEXILATE MESYLATE 150 MG PO CAPS: 150.0000 mg | ORAL_CAPSULE | Freq: Two times a day (BID) | ORAL | Status: DC

## 2013-09-18 MED ORDER — ESCITALOPRAM OXALATE 20 MG PO TABS
20.0000 mg | ORAL_TABLET | Freq: Every day | ORAL | Status: DC
  Administered 2013-09-18 (×2): 20 mg via ORAL
  Filled 2013-09-18 (×3): qty 1

## 2013-09-18 MED ORDER — BIOTIN 10 MG PO TABS: 1.0000 | ORAL_TABLET | Freq: Every day | ORAL | Status: DC

## 2013-09-18 MED ORDER — ESCITALOPRAM OXALATE 20 MG PO TABS: 20.0000 mg | ORAL_TABLET | Freq: Every day | ORAL | Status: DC

## 2013-09-18 MED ORDER — ZOLPIDEM TARTRATE 5 MG PO TABS
5.0000 mg | ORAL_TABLET | Freq: Every evening | ORAL | Status: DC | PRN
  Administered 2013-09-18: 5 mg via ORAL
  Filled 2013-09-18: qty 1

## 2013-09-18 MED ORDER — DICYCLOMINE HCL 10 MG PO CAPS
10.0000 mg | ORAL_CAPSULE | Freq: Two times a day (BID) | ORAL | Status: DC
  Administered 2013-09-18 (×2): 10 mg via ORAL
  Filled 2013-09-18 (×4): qty 1

## 2013-09-18 MED ORDER — HYDROCODONE-ACETAMINOPHEN 5-325 MG PO TABS
1.0000 | ORAL_TABLET | Freq: Four times a day (QID) | ORAL | Status: DC | PRN
  Administered 2013-09-18 (×2): 1 via ORAL
  Filled 2013-09-18 (×2): qty 1

## 2013-09-18 MED ORDER — TRAZODONE HCL 150 MG PO TABS
150.0000 mg | ORAL_TABLET | Freq: Every day | ORAL | Status: DC
  Administered 2013-09-18 (×2): 150 mg via ORAL
  Filled 2013-09-18 (×3): qty 1

## 2013-09-18 MED ORDER — DABIGATRAN ETEXILATE MESYLATE 150 MG PO CAPS
150.0000 mg | ORAL_CAPSULE | Freq: Two times a day (BID) | ORAL | Status: DC
  Administered 2013-09-18 (×3): 150 mg via ORAL
  Filled 2013-09-18 (×5): qty 1

## 2013-09-18 MED ORDER — PROPAFENONE HCL ER 325 MG PO CP12
325.0000 mg | ORAL_CAPSULE | Freq: Two times a day (BID) | ORAL | Status: DC
  Administered 2013-09-18 (×3): 325 mg via ORAL
  Filled 2013-09-18 (×5): qty 1

## 2013-09-18 NOTE — Progress Notes (Signed)
Utilization review completed.  

## 2013-09-18 NOTE — Progress Notes (Addendum)
Patient: Jacqueline Kim / Admit Date: 09/17/2013 / Date of Encounter: 09/18/2013, 8:20 AM  Subjective  No longer away of palpitations - only when she counts her pulse. No orthopnea, chest pain, LEE. Ran out of her hydrocodone on Friday and awaiting PCP refill on Monday. Did not sleep well because she did not get her sleeping pill.  Objective   Telemetry: remains in AF, rate gradually controlled. Currently 65s  Physical Exam: Blood pressure 121/71, pulse 65, temperature 97.8 F (36.6 C), temperature source Oral, resp. rate 18, height 5\' 5"  (1.651 m), weight 153 lb (69.4 kg), SpO2 93.00%. General: Well developed, well nourished WF in no acute distress. Head: Normocephalic, atraumatic, sclera non-icteric, no xanthomas, nares are without discharge. Neck: Negative for carotid bruits. JVP not elevated. Lungs: Clear bilaterally to auscultation without wheezes, rales, or rhonchi. Breathing is unlabored. Heart: Irreg irreg, rate controlled, S1 S2 without murmurs, rubs, or gallops.  Abdomen: Soft, non-tender, non-distended with normoactive bowel sounds. No rebound/guarding. Extremities: No clubbing or cyanosis. No edema. Distal pedal pulses are 2+ and equal bilaterally. Neuro: Alert and oriented X 3. Moves all extremities spontaneously. Psych:  Responds to questions appropriately with a normal affect.   Intake/Output Summary (Last 24 hours) at 09/18/13 0820 Last data filed at 09/18/13 0800  Gross per 24 hour  Intake 135.92 ml  Output    600 ml  Net -464.08 ml    Inpatient Medications:  . atenolol  50 mg Oral Daily  . dabigatran  150 mg Oral Q12H  . dicyclomine  10 mg Oral BID  . escitalopram  20 mg Oral QHS  . levothyroxine  112 mcg Oral QAC breakfast  . losartan  50 mg Oral Daily  . magnesium oxide  400 mg Oral Daily  . propafenone  325 mg Oral BID  . traZODone  150 mg Oral QHS   Infusions:  . diltiazem (CARDIZEM) infusion 10 mg/hr (09/18/13 0800)    Labs:  Recent Labs   09/17/13 2010  NA 139  K 4.1  CL 98  CO2 22  GLUCOSE 85  BUN 21  CREATININE 0.99  CALCIUM 9.6   No results found for this basename: AST, ALT, ALKPHOS, BILITOT, PROT, ALBUMIN,  in the last 72 hours  Recent Labs  09/17/13 2010  WBC 6.6  HGB 12.4  HCT 36.9  MCV 95.3  PLT 234    Recent Labs  09/18/13 0332  TROPONINI <0.30   No components found with this basename: POCBNP,  No results found for this basename: HGBA1C,  in the last 72 hours   Radiology/Studies:  Dg Chest 2 View  09/17/2013   CLINICAL DATA:  Atrial fibrillation  EXAM: CHEST  2 VIEW  COMPARISON:  October 08, 2012  FINDINGS: Lungs are clear. Heart size and pulmonary vascularity are normal. No adenopathy. Pacemaker leads are attached to the right atrium and right ventricle. No pneumothorax. There is thoracolumbar levoscoliosis.  IMPRESSION: No edema or consolidation.   Electronically Signed   By: October 10, 2012 M.D.   On: 09/17/2013 21:12     Assessment and Plan  1. Recurrent paroxysmal atrial fibrillation - remains in AF this AM. Has been taken off Cardizem by Dr. 11/17/2013 in the past to avoid overutilization of pacemaker. Remains on IV dilt. Last pacer check indicates infrequent episodes of AF without documented recurrence in 6 months - until now. It sounds like she tends to spontaneously convert when she does go into this. She is on propafenone and Pradaxa. Will  discuss further plans with MD.  2. Tachybrady syndrome s/p PPM - followed by Dr. Salena Saner, pacemaker function OK 09/05/13. 3. Chest pain - troponins negative x 2 thus far. No further CP. F/u troponins pending. 4. Hypothyroidism - normal TSH. 5. Elevated pBNP - no clinical evidence of CHF. May be rate related to PAF. Can consider echo (last in 2013), may be able to do as outpt. 6. Arthritis - will ask pharmacy to clarify hydrocodone and add to med list if appropriate. Have ordered her Remus Loffler in place of home Sonata in case she stays tonight.  Signed, Ronie Spies  PA-C  As above; patient seen and examined; patient with no chest pain and no dyspnea; remains in atrial fibrillation. Plan change cardizem to po. Continue pradaxa (she has not missed any doses). Keep NPO after MN. If she does not convert, proceed with DCCV in AM. Continue propafenone. If atrial fibrillation becomes more frequent, change antiarrhythmics or consider ablation in the future. Given addition of cardizem, DC cozaar and follow BP. Olga Millers

## 2013-09-19 ENCOUNTER — Encounter (HOSPITAL_COMMUNITY): Payer: Self-pay | Admitting: Physician Assistant

## 2013-09-19 ENCOUNTER — Encounter (HOSPITAL_COMMUNITY)
Admission: EM | Disposition: A | Discharge status: Home / Self Care | Payer: Self-pay | Source: Home / Self Care | Attending: Emergency Medicine

## 2013-09-19 ENCOUNTER — Telehealth: Payer: Self-pay | Admitting: Internal Medicine

## 2013-09-19 DIAGNOSIS — R079 Chest pain, unspecified: Secondary | ICD-10-CM

## 2013-09-19 DIAGNOSIS — F411 Generalized anxiety disorder: Secondary | ICD-10-CM

## 2013-09-19 DIAGNOSIS — Z7901 Long term (current) use of anticoagulants: Secondary | ICD-10-CM

## 2013-09-19 DIAGNOSIS — Z950 Presence of cardiac pacemaker: Secondary | ICD-10-CM

## 2013-09-19 SURGERY — CARDIOVERSION
Anesthesia: LOCAL

## 2013-09-19 MED ORDER — DICYCLOMINE HCL 10 MG PO CAPS: ORAL_CAPSULE | ORAL | Status: DC

## 2013-09-19 NOTE — Progress Notes (Signed)
Patient Name: Jacqueline Kim Date of Encounter: 09/19/2013  Active Problems:   Atrial fibrillation   Patient Profile: 68 yo female with PAF and MDT PPM for tachy/brady was admitted 04/04 w/ RVR and chest pain.   SUBJECTIVE: No more palpitations, no chest pain.   OBJECTIVE Filed Vitals:   09/18/13 0539 09/18/13 1400 09/18/13 2124 09/19/13 0603  BP: 121/71 105/68 108/60 123/70  Pulse: 65 72 63 63  Temp: 97.8 F (36.6 C) 97.7 F (36.5 C) 97.8 F (36.6 C) 97.3 F (36.3 C)  TempSrc: Oral Oral Oral Oral  Resp: 18 18 18 16   Height:      Weight:      SpO2: 93% 95% 97% 96%   No intake or output data in the 24 hours ending 09/19/13 1009 Filed Weights   09/17/13 1932 09/18/13 0112  Weight: 152 lb (68.947 kg) 153 lb (69.4 kg)    PHYSICAL EXAM General: Well developed, well nourished, female in no acute distress. Head: Normocephalic, atraumatic.  Neck: Supple without bruits, JVD not elevated . Lungs:  Resp regular and unlabored, CTA. Heart: RRR, S1, S2, no S3, S4, or murmur; no rub. Abdomen: Soft, non-tender, non-distended, BS + x 4.  Extremities: No clubbing, cyanosis, no edema.  Neuro: Alert and oriented X 3. Moves all extremities spontaneously. Psych: Normal affect.  LABS: CBC: Recent Labs  09/17/13 2010  WBC 6.6  HGB 12.4  HCT 36.9  MCV 95.3  PLT 234   Basic Metabolic Panel: Recent Labs  09/17/13 2010  NA 139  K 4.1  CL 98  CO2 22  GLUCOSE 85  BUN 21  CREATININE 0.99  CALCIUM 9.6   Cardiac Enzymes: Recent Labs  09/18/13 0332 09/18/13 0845 09/18/13 1311  TROPONINI <0.30 <0.30 <0.30    Recent Labs  09/17/13 2028  TROPIPOC 0.00   BNP: Pro B Natriuretic peptide (BNP)  Date/Time Value Ref Range Status  09/18/2013  3:32 AM 1110.0* 0 - 125 pg/mL Final  12/30/2006  5:30 AM 125.0*  Final   Thyroid Function Tests: Recent Labs  09/18/13 0330  TSH 4.260   TELE:  Spontaneous conversion to SR.      Radiology/Studies: Dg Chest 2  View 09/17/2013   CLINICAL DATA:  Atrial fibrillation  EXAM: CHEST  2 VIEW  COMPARISON:  October 08, 2012  FINDINGS: Lungs are clear. Heart size and pulmonary vascularity are normal. No adenopathy. Pacemaker leads are attached to the right atrium and right ventricle. No pneumothorax. There is thoracolumbar levoscoliosis.  IMPRESSION: No edema or consolidation.   Electronically Signed   By: October 10, 2012 M.D.   On: 09/17/2013 21:12     Current Medications:  . atenolol  50 mg Oral Daily  . dabigatran  150 mg Oral Q12H  . dicyclomine  10 mg Oral BID  . diltiazem  60 mg Oral 4 times per day  . escitalopram  20 mg Oral QHS  . levothyroxine  112 mcg Oral QAC breakfast  . magnesium oxide  400 mg Oral Daily  . propafenone  325 mg Oral BID  . traZODone  150 mg Oral QHS      ASSESSMENT AND PLAN: Active Problems:   Atrial fibrillation - paroxysmal - spontaneously converted to SR. No further inpatient workup. She does not want long-acting Cardizem because she had bradycardia and side effects. Continue Atenolol at current dose, she is not tachycardic when in SR. F/u w/ Dr. 11/17/2013 as scheduled. Continue Pradaxa.    Chronic  anticoagulation - She is concerned about cost of Pradaxa, but C.M. Checked at her copay is 45$/mo. Discussed use of Coumadin, she currently prefers Pradaxa. Everything else would be more.     S/p MDT PPM - f/u w/ Dr. Royann Shivers as scheduled.  Plan - D/C today and f/u w/ Dr. [name].  SignedTheodore Demark , PA-C 10:09 AM 09/19/2013  Agree with note by Theodore Demark PA-C. Pt of Drs TK and MCR with PAF s/p PTVPM on Pradaxa and propafenone. Converted to NSR. OK for D/C home this AM. ROV with TK.  Runell Gess, M.D., FACP, [facility], [facility] [place] [address] [place], [place]  [address]  [phone] 09/19/2013 11:03 AM

## 2013-09-19 NOTE — Care Management Note (Signed)
    Page 1 of 1   09/19/2013     2:23:43 PM   CARE MANAGEMENT NOTE 09/19/2013  Patient:  Jacqueline Kim, Jacqueline Kim   Account Number:  [id number]  Date Initiated:  09/19/2013  Documentation initiated by:  Angelina Venard  Subjective/Objective Assessment:   PT ADM ON 4/4 WITH AFIB     Action/Plan:   PT CONCERNED ABOUT COST OF PRADAXA, PER CARDIOLOGY PA   Anticipated DC Date:  09/19/2013   Anticipated DC Plan:  HOME/SELF CARE      DC Planning Services  CM consult  Medication Assistance      Choice offered to / List presented to:             Status of service:  Completed, signed off Medicare Important Message given?   (If response is "NO", the following Medicare IM given date fields will be blank) Date Medicare IM given:   Date Additional Medicare IM given:    Discharge Disposition:  HOME/SELF CARE  Per UR Regulation:  Reviewed for med. necessity/level of care/duration of stay  If discussed at Long Length of Stay Meetings, dates discussed:    Comments:  09/19/13 Myrle Dues,RN,BSN [phone] CHECKED WITH PT'S PHARMACY, [facility]: PHARMACIST STATES PT PICKED UP 90 DAY SUPPLY OF PRADAX ABOUT 2 WEEKS AGO, FOR THE COST OF $135.  PA UPDATED.

## 2013-09-19 NOTE — Progress Notes (Signed)
Patient discharged to home.  Patient alert, oriented, verbally responsive, breathing regular and non-labored throughout, no s/s of distress noted throughout, no c/o pain throughout.  Discharge instructions verbalized to patient.  Patient verbalized understanding throughout.  Patient refused all morning medicines when offered and stated that she would take them at home.  Patient informed of all risks associated with refusing.  Patient left unit per wheelchair accompanied by volunteer transporter.  VS WNL.  [facility] 09/19/2013 12:54 PM

## 2013-09-19 NOTE — Discharge Summary (Signed)
CARDIOLOGY DISCHARGE SUMMARY   Patient ID: Jacqueline Kim MRN: [id number] DOB/AGE: 1945/11/20 68 y.o.  Admit date: 09/17/2013 Discharge date: 09/19/2013  PCP: Thayer Headings, MD Primary Cardiologist:  Dr. Royann Shivers, Dr. [name]  Primary Discharge Diagnosis:  Atrial fibrillation with rapid ventricular response; paroxysmal Secondary Discharge Diagnosis:    Pacemaker Medtronic  (REVO) placed10/23/12   Anticoagulant long-term use  WITH PRADAXA   Hypothyroidism  Hospital Course: Jacqueline Kim is a 68 y.o. female with no history of CAD. She had sudden onset of rapid palpitations on the evening of admission. It was associated with 5/10 chest pain. She came to the emergency room and was in atrial fibrillation with a heart rate in the 150s. Her initial troponin was not elevated and she had no acute ischemic ECG changes. She was admitted for further evaluation and treatment.  She was started on IV Cardizem for rate control. She is reluctant to take this medication long term because of bradycardia and side effects, but it is helpful short-term for rate control. She was continued on her home dose of atenolol. Her pacemaker was interrogated 2 weeks ago and she had had no atrial fibrillation recurrence in 6 months.  Her BNP was elevated but her chest x-ray and physical exam did not demonstrate volume overload. Her last echocardiogram was in 2013 and Dr. [name] is to make a decision about this as an outpatient. Her TSH was checked and was normal. She was continued on her home dose of Synthroid. She was continued on her Pradaxa for anticoagulation. A case management consult was called to help her evaluate the cost of the different anticoagulants but her copay is $45/mo for the Pradaxa and she is tolerating it well. No changes will be made at this time.  On 4/6, she spontaneously converted to sinus rhythm. Her chest pain resolved once her heart rate slowed and her cardiac enzymes remain negative. A decision  on stress testing can be made as an outpatient as she has no history of exertional chest pain. No further inpatient workup is indicated and she is considered stable for discharge, to follow up as an outpatient as scheduled.  Labs:  Lab Results  Component Value Date   WBC 6.6 09/17/2013   HGB 12.4 09/17/2013   HCT 36.9 09/17/2013   MCV 95.3 09/17/2013   PLT 234 09/17/2013     Recent Labs Lab 09/17/13 2010  NA 139  K 4.1  CL 98  CO2 22  BUN 21  CREATININE 0.99  CALCIUM 9.6  GLUCOSE 85    Recent Labs  09/18/13 0332 09/18/13 0845 09/18/13 1311  TROPONINI <0.30 <0.30 <0.30   Pro B Natriuretic peptide (BNP)  Date/Time Value Ref Range Status  09/18/2013  3:32 AM 1110.0* 0 - 125 pg/mL Final  12/30/2006  5:30 AM 125.0*  Final   Lab Results  Component Value Date   TSH 4.260 09/18/2013      Radiology: Dg Chest 2 View 09/17/2013   CLINICAL DATA:  Atrial fibrillation  EXAM: CHEST  2 VIEW  COMPARISON:  October 08, 2012  FINDINGS: Lungs are clear. Heart size and pulmonary vascularity are normal. No adenopathy. Pacemaker leads are attached to the right atrium and right ventricle. No pneumothorax. There is thoracolumbar levoscoliosis.  IMPRESSION: No edema or consolidation.   Electronically Signed   By: Bretta Bang M.D.   On: 09/17/2013 21:12   EKG: 17-Sep-2013 19:29:44 Atrial fibrillation with rapid ventricular response Nonspecific ST and T wave abnormality ,  probably digitalis effect Vent. rate 154 BPM PR interval * ms QRS duration 80 ms QT/QTc 294/470 ms P-R-T axes * 8 10511:   FOLLOW UP PLANS AND APPOINTMENTS Allergies  Allergen Reactions  . Penicillins Anaphylaxis  . Epinephrine Other (See Comments)    Causes A-Fib  . Metoprolol     alopecia  . Lidocaine Palpitations    Or any similar medication     Medication List         atenolol 50 MG tablet  Commonly known as:  TENORMIN  Take 1 tablet (50 mg total) by mouth daily.     Biotin 10 MG Tabs  Take 1 tablet by mouth  daily.     CELEBREX 200 MG capsule  Generic drug:  celecoxib  Take 200 mg by mouth as needed.     conjugated estrogens vaginal cream  Commonly known as:  PREMARIN  Place 1.5 g vaginally daily as needed.     dabigatran 150 MG Caps capsule  Commonly known as:  PRADAXA  Take 1 capsule (150 mg total) by mouth every 12 (twelve) hours.     escitalopram 20 MG tablet  Commonly known as:  LEXAPRO  Take 20 mg by mouth at bedtime.     estrogens (conjugated) 0.3 MG tablet  Commonly known as:  PREMARIN  Take 0.3 mg by mouth daily.     levothyroxine 112 MCG tablet  Commonly known as:  SYNTHROID, LEVOTHROID  Take 112 mcg by mouth daily before breakfast.     losartan 50 MG tablet  Commonly known as:  COZAAR  Take 1 tablet (50 mg total) by mouth daily.     magnesium oxide 400 (241.3 MG) MG tablet  Commonly known as:  MAG-OX  Take 1 tablet (400 mg total) by mouth daily.     multivitamins ther. w/minerals Tabs tablet  Take 1 tablet by mouth at bedtime.     propafenone 325 MG 12 hr capsule  Commonly known as:  RYTHMOL SR  Take 1 capsule (325 mg total) by mouth 2 (two) times daily.     traZODone 150 MG tablet  Commonly known as:  DESYREL  Take 150 mg by mouth at bedtime.     zaleplon 10 MG capsule  Commonly known as:  SONATA  Take 10 mg by mouth at bedtime.        Discharge Orders   Future Appointments Provider Department Dept Phone   09/22/2013 11:30 AM [name], MD [facility] [phone]   Future Orders Complete By Expires   Diet - low sodium heart healthy  As directed    Increase activity slowly  As directed      Follow-up Information   Follow up with [name], MD On 09/22/2013. (at 11:30 am)    Specialty:  Cardiology   Contact information:   [address] [place] [address] [phone]       BRING ALL MEDICATIONS WITH YOU TO FOLLOW UP APPOINTMENTS  Time spent with patient to include physician time: 36  min Signed: Theodore Demark, PA-C 09/19/2013, 3:15 PM Co-Sign MD

## 2013-09-19 NOTE — Telephone Encounter (Signed)
Rx sent. Patient needs office visit for further refills. 

## 2013-09-22 ENCOUNTER — Encounter: Payer: Self-pay | Admitting: Cardiovascular Disease

## 2013-09-22 ENCOUNTER — Ambulatory Visit (INDEPENDENT_AMBULATORY_CARE_PROVIDER_SITE_OTHER): Payer: Medicare Other | Admitting: Cardiovascular Disease

## 2013-09-22 VITALS — BP 159/74 | HR 63 | Ht 65.0 in | Wt 161.5 lb

## 2013-09-22 DIAGNOSIS — I480 Paroxysmal atrial fibrillation: Secondary | ICD-10-CM

## 2013-09-22 DIAGNOSIS — I10 Essential (primary) hypertension: Secondary | ICD-10-CM

## 2013-09-22 DIAGNOSIS — Z7901 Long term (current) use of anticoagulants: Secondary | ICD-10-CM

## 2013-09-22 DIAGNOSIS — I4891 Unspecified atrial fibrillation: Secondary | ICD-10-CM

## 2013-09-22 DIAGNOSIS — Z950 Presence of cardiac pacemaker: Secondary | ICD-10-CM

## 2013-09-22 DIAGNOSIS — I495 Sick sinus syndrome: Secondary | ICD-10-CM

## 2013-09-22 DIAGNOSIS — E039 Hypothyroidism, unspecified: Secondary | ICD-10-CM

## 2013-09-22 NOTE — Patient Instructions (Signed)
Your physician recommends that you schedule a follow-up appointment in: 3 Months  You have been referred to Dr Hillis Range  Your physician has recommended you make the following change in your medication: Increase :Losartan to 50 mg twice a day

## 2013-09-27 ENCOUNTER — Encounter: Payer: Medicare Other | Admitting: Cardiovascular Disease

## 2013-09-28 ENCOUNTER — Encounter: Payer: Self-pay | Admitting: Cardiovascular Disease

## 2013-09-28 DIAGNOSIS — I10 Essential (primary) hypertension: Secondary | ICD-10-CM | POA: Insufficient documentation

## 2013-09-28 NOTE — Progress Notes (Signed)
Patient ID: [name], female   DOB: December 11, 1945, 68 y.o.   MRN: [id number]      HPI: Jacqueline Kim is a 68 y.o. female  who presents for Cardiologic followup evaluation after recently being hospital with recurrent AF.  Jacqueline Kim has known history of sinus node dysfunction. She has history of paroxysmal atrial fibrillation. She is status post permanent pacemaker which was implanted on 04/08/2011 Medtronic Revo MRI compatible pacemaker. She also has a history of hypothyroidism. She has had recurrent episodes of paroxysmal atrial fibrillation and has been on pradaxa anticoagulation for several years after initially having been on warfarin.  Most recently she's been maintained on rhythmol SR 325 mg twice a day. She also is on atenolol 50 mg daily. She had been on diltiazem but this was discontinued by Dr. Alanda Amass. She also is on losartan 50 mg for hypertension. He last saw Dr. Alanda Amass in August 2014 at which time she was doing well. She had been hospitalized in January 2014 with recurrent AF in the setting of significant increased family stress. She is now separated and living in a different household from her husband Dr. [name]. Jacqueline Kim states that she did feel her heart rate becomes irregular and fast on 03/12/2013. This lasted approximately 12 hours. She did take an extra Tenormin. Ultimately this seemed to convert back into sinus rhythm.  I had seen her last in the office several months ago, when she reestablish care with me after Dr. Kandis Cocking retirement.  On 09/17/2013, she recurrent rapid palpitations when she was prepared for disease.  Her dinner with family members.  She was hospitalized and presented with a heart rate at 150.  She was started on IV Cardizem for rate control.  She spontaneously cardioverted to sinus rhythm on April 6 prior to a planned TEE cardioversion.  She presents now for followup evaluation.  She denies any chest pressure.  She is unaware of any recurrent AF since  been discharged.  She believes she is sleeping well.  She is unaware of any apneic spells.  She denies residual daytime sleepiness or nonrestorative sleep.  Past Medical History  Diagnosis Date  . Paroxysmal a-fib diagnosed 2008    a. On propafenone, Pradaxa.  . 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 Hypothyroid   . Anxiety   . Hypertension   . Glaucoma   . Complication of anesthesia     DIFFICULTY WAKING UP  . Arthritis   . Anticoagulant long-term use  WITH PRADAXA 05/27/2011  . DJD (degenerative joint disease)   . GERD (gastroesophageal reflux disease)   . IBS (irritable bowel syndrome)   . Depression   . Diverticulosis   . External hemorrhoids   . Esophagitis 09/17/06  . History of stress test     a. 2008 - normal nuc.    Past Surgical History  Procedure Laterality Date  . Pacemaker insertion  04/08/11    MDT Revo implanted by Dr Rubie Maid  . Eye surgery      cataracts  . Abdominal hysterectomy    . Liposuction    . Ovarian cyst removal    . Foot surgery    . Hand surgery      multiple  . Breast reduction surgery    . Facial cosmetic surgery    . Tonsillectomy    . Nm myocar perf wall motion  12/29/2006    No significant ischemia; EF 69%  Allergies  Allergen Reactions  . Penicillins Anaphylaxis  . Epinephrine Other (See Comments)    Causes A-Fib  . Metoprolol     alopecia  . Lidocaine Palpitations    Or any similar medication    Current Outpatient Prescriptions  Medication Sig Dispense Refill  . atenolol (TENORMIN) 50 MG tablet Take 1 tablet (50 mg total) by mouth daily.  90 tablet  2  . Biotin 10 MG TABS Take 1 tablet by mouth daily.       . CELEBREX 200 MG capsule Take 200 mg by mouth as needed.       . conjugated estrogens (PREMARIN) vaginal cream Place 1.5 g vaginally daily as needed.       . dabigatran (PRADAXA) 150 MG CAPS capsule Take 1 capsule (150 mg total) by mouth every 12 (twelve) hours.  180 capsule  2    . dicyclomine (BENTYL) 10 MG capsule TAKE ONE CAPSULE TWICE A DAY. MUST HAVE OFFICE VISIT FOR FURTHER REFILLS  60 capsule  0  . escitalopram (LEXAPRO) 20 MG tablet Take 20 mg by mouth at bedtime.       Marland Kitchen estrogens, conjugated, (PREMARIN) 0.3 MG tablet Take 0.3 mg by mouth daily.       . furosemide (LASIX) 20 MG tablet Take 20 mg by mouth as needed.      Marland Kitchen HYDROcodone-acetaminophen (NORCO/VICODIN) 5-325 MG per tablet Take 1 tablet by mouth every 6 (six) hours as needed.      Marland Kitchen levothyroxine (SYNTHROID, LEVOTHROID) 112 MCG tablet Take 112 mcg by mouth daily before breakfast.      . losartan (COZAAR) 50 MG tablet Take 50 mg by mouth 2 (two) times daily.      . magnesium oxide (MAG-OX) 400 (241.3 MG) MG tablet Take 1 tablet (400 mg total) by mouth daily.  30 tablet  5  . Multiple Vitamins-Minerals (MULTIVITAMINS THER. W/MINERALS) TABS Take 1 tablet by mouth at bedtime.        . propafenone (RYTHMOL SR) 325 MG 12 hr capsule Take 1 capsule (325 mg total) by mouth 2 (two) times daily.  180 capsule  2  . traZODone (DESYREL) 150 MG tablet Take 150 mg by mouth at bedtime.        . zaleplon (SONATA) 10 MG capsule Take 10 mg by mouth at bedtime.        No current facility-administered medications for this visit.    History   Social History  . Marital Status: Married    Spouse Name: N/A    Number of Children: 3  . Years of Education: N/A   Occupational History  . retired    Social History Main Topics  . Smoking status: Former Games developer  . Smokeless tobacco: Never Used     Comment: Quit 15 years ago  . Alcohol Use: 3.6 oz/week    6 Glasses of wine per week     Comment: daily  . Drug Use: No  . Sexual Activity: Yes   Other Topics Concern  . Not on file   Social History Narrative   Lives in [place],  Former [employer],  Separated.      Family History  Problem Relation Age of Onset  . Anesthesia problems Mother   . Colon cancer Neg Hx   . Colon polyps Mother     beign     ROS is negative for fevers, chills or night sweats.  He denies change in vision or hearing.  She is  unaware of lymphadenopathy.  She denies cough, wheezing or significant sputum production. S episodes of dictation  She denies recent chest pain. She denies abdominal pain. She denies nausea vomiting or diarrhea. She denies hematuria or hematochezia. She denies claudication. She denies myalgias. She denies tremors. She denies significant change in weight.  Other comprehensive 14 point system review is negative.  PE BP 159/74  Pulse 63  Ht 5\' 5"  (1.651 m)  Wt 161 lb 8 oz (73.256 kg)  BMI 26.88 kg/m2  General: Alert, oriented, no distress.  Skin: normal turgor, no rashes HEENT: Normocephalic, atraumatic. Pupils round and reactive; sclera anicteric;no lid lag.  Nose without nasal septal hypertrophy Mouth/Parynx benign; Mallinpatti scale 2 Neck: No JVD, no carotid briuts Lungs: clear to ausculatation and percussion; no wheezing or rales Heart: RRR, s1 s2 normal 1/6 systolic murmur Abdomen: soft, nontender; no hepatosplenomehaly, BS+; abdominal aorta nontender and not dilated by palpation. Pulses 2+ Extremities: no clubbing cyanosis or edema, Homan's sign negative  Neurologic: grossly nonfocal Psychologic: normal affect and mood.  ECG (independently read by me): Atrial paced rhythm at 63 beats per minute with ventricular sensing.   LABS:  BMET    Component Value Date/Time   NA 139 09/17/2013 2010   K 4.1 09/17/2013 2010   CL 98 09/17/2013 2010   CO2 22 09/17/2013 2010   GLUCOSE 85 09/17/2013 2010   BUN 21 09/17/2013 2010   CREATININE 0.99 09/17/2013 2010   CREATININE 1.05 02/23/2013 1550   CALCIUM 9.6 09/17/2013 2010   GFRNONAA 56* 09/17/2013 2010   GFRAA 65* 09/17/2013 2010     Hepatic Function Panel     Component Value Date/Time   PROT 6.3 02/03/2013 1549   ALBUMIN 3.9 02/03/2013 1549   AST 26 02/03/2013 1549   ALT 14 02/03/2013 1549   ALKPHOS 36* 02/03/2013 1549   BILITOT 0.3 02/03/2013  1549   BILIDIR <0.1 04/01/2011 1147   IBILI NOT CALCULATED 04/01/2011 1147     CBC    Component Value Date/Time   WBC 6.6 09/17/2013 2010   RBC 3.87 09/17/2013 2010   HGB 12.4 09/17/2013 2010   HCT 36.9 09/17/2013 2010   PLT 234 09/17/2013 2010   MCV 95.3 09/17/2013 2010   MCH 32.0 09/17/2013 2010   MCHC 33.6 09/17/2013 2010   RDW 13.3 09/17/2013 2010   LYMPHSABS 1.1 02/03/2013 1549   MONOABS 0.5 02/03/2013 1549   EOSABS 0.2 02/03/2013 1549   BASOSABS 0.0 02/03/2013 1549     BNP    Component Value Date/Time   PROBNP 1110.0* 09/18/2013 0332    Lipid Panel     Component Value Date/Time   CHOL  Value: 172        ATP III CLASSIFICATION:  <200     mg/dL   Desirable  989-211  mg/dL   Borderline High  >=941    mg/dL   High 12/17/812 4818   TRIG 293* 12/19/2006 0315   HDL 37* 12/19/2006 0315   CHOLHDL 4.6 12/19/2006 0315   VLDL 59* 12/19/2006 0315   LDLCALC  Value: 76        Total Cholesterol/HDL:CHD Risk Coronary Heart Disease Risk Table                     Men   Women  1/2 Average Risk   3.4   3.3 12/19/2006 0315     RADIOLOGY: No results found.    ASSESSMENT AND PLAN: Jacqueline Kim has a  history of PAF and status post permanent pacemaker for sinus node dysfunction. She is anticoagulated with her Pradaxa/.  When I last saw her in the office in October  I interrogated her pacemaker at that time found that she had  a 12 hour episode of atrial tachycardia with a maximum rate of 176 beats per minute on 03/12/2013. She is on Propofenone SR 325 twice a day. She stopped taking her diltiazem since this did not make her feel well and she is on atenolol 50 mg. she again has had a recurrent episode of AF with a rate up to 150 beats per minute, leading to a two-day hospitalization from April 4 through 09/19/2013.  She is anticoagulated.  Blood pressure today is elevated and I recommended titration of her losartan from 50 mg daily to 50 mg twice a day.  Most often her AF episodes are precipitated by stress.   I discussed with her evaluation for potential candidacy for atrial fibrillation ablation.  I have suggested that she see Dr. Hillis Range to discuss this matter in more detail.  We will try to arrange this to be scheduled over the next several weeks.    [name], MD, [facility]  09/28/2013 5:49 PM

## 2013-10-13 ENCOUNTER — Encounter: Payer: Self-pay | Admitting: Internal Medicine

## 2013-10-13 ENCOUNTER — Ambulatory Visit (INDEPENDENT_AMBULATORY_CARE_PROVIDER_SITE_OTHER): Payer: Medicare Other | Admitting: Internal Medicine

## 2013-10-13 ENCOUNTER — Other Ambulatory Visit: Payer: Self-pay

## 2013-10-13 VITALS — BP 109/66 | HR 64 | Ht 65.0 in | Wt 154.0 lb

## 2013-10-13 DIAGNOSIS — I10 Essential (primary) hypertension: Secondary | ICD-10-CM

## 2013-10-13 DIAGNOSIS — I4891 Unspecified atrial fibrillation: Secondary | ICD-10-CM

## 2013-10-13 DIAGNOSIS — Z950 Presence of cardiac pacemaker: Secondary | ICD-10-CM

## 2013-10-13 DIAGNOSIS — I480 Paroxysmal atrial fibrillation: Secondary | ICD-10-CM

## 2013-10-13 DIAGNOSIS — I495 Sick sinus syndrome: Secondary | ICD-10-CM

## 2013-10-13 MED ORDER — LOSARTAN POTASSIUM 50 MG PO TABS: 50.0000 mg | ORAL_TABLET | Freq: Two times a day (BID) | ORAL | Status: DC

## 2013-10-13 NOTE — Telephone Encounter (Signed)
Rx was sent to pharmacy electronically. 

## 2013-10-13 NOTE — Patient Instructions (Signed)
Your physician has recommended that you have an ablation. Catheter ablation is a medical procedure used to treat some cardiac arrhythmias (irregular heartbeats). During catheter ablation, a long, thin, flexible tube is put into a blood vessel in your groin (upper thigh), or neck. This tube is called an ablation catheter. It is then guided to your heart through the blood vessel. Radio frequency waves destroy small areas of heart tissue where abnormal heartbeats may cause an arrhythmia to start. Please see the instruction sheet given to you today.  CAll [phone] if you decide to proceed with procedure    Your physician recommends that you schedule a follow-up appointment as needed with Dr Johney Frame ans as scheduled with Dr [name]  Your physician has requested that you have an echocardiogram. Echocardiography is a painless test that uses sound waves to create images of your heart. It provides your doctor with information about the size and shape of your heart and how well your heart's chambers and valves are working. This procedure takes approximately one hour. There are no restrictions for this procedure.

## 2013-10-16 NOTE — Progress Notes (Signed)
Primary Care Physician: Jacqueline Headings, MD Referring Physician:  Dr Livia Snellen is a 68 y.o. female with a h/o paroxysmal atrial fibrillation and tachycardia bradycardia syndrome who presents today for EP consultation.  She reports having atrial fibrillation for several years.  She underwent PPM (MDT) implant in 2012 for tachy/brady syndrome.  She has done well since that time with only rare afib.  She takes propafenone twice daily with good control typically.  She is appropriately anticoagulated.  She reports having afi 1/14, 9/14, and most recently 4/15.  During afib, she has tachypalpitations with chest discomfort.  During her most recent episodes, she spontaneously converted to sinus rhythm.  She has been reluctant to consider changes in her medicine. Today, she denies symptoms of  Exertional chest pain, shortness of breath, orthopnea, PND, lower extremity edema, dizziness, presyncope, syncope, or neurologic sequela. The patient is tolerating medications without difficulties and is otherwise without complaint today.   Past Medical History  Diagnosis Date  . Paroxysmal a-fib diagnosed 2008    a. On propafenone, Pradaxa.  . 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 Hypothyroid   . Anxiety   . Hypertension   . Glaucoma   . Complication of anesthesia     DIFFICULTY WAKING UP  . Arthritis   . Anticoagulant long-term use  WITH PRADAXA 05/27/2011  . DJD (degenerative joint disease)   . GERD (gastroesophageal reflux disease)   . IBS (irritable bowel syndrome)   . Depression   . Diverticulosis   . External hemorrhoids   . Esophagitis 09/17/06  . History of stress test     a. 2008 - normal nuc.   Past Surgical History  Procedure Laterality Date  . Pacemaker insertion  04/08/11    MDT Revo implanted by Dr Rubie Maid  . Eye surgery      cataracts  . Abdominal hysterectomy    . Liposuction    . Ovarian cyst removal    . Foot surgery    .  Hand surgery      multiple  . Breast reduction surgery    . Facial cosmetic surgery    . Tonsillectomy    . Nm myocar perf wall motion  12/29/2006    No significant ischemia; EF 69%    Current Outpatient Prescriptions  Medication Sig Dispense Refill  . atenolol (TENORMIN) 50 MG tablet Take 1 tablet (50 mg total) by mouth daily.  90 tablet  2  . Biotin 5000 MCG TABS Take 1 tablet by mouth daily.      . CELEBREX 200 MG capsule Take 200 mg by mouth as needed.       . conjugated estrogens (PREMARIN) vaginal cream Place 1.5 g vaginally daily as needed.       . dabigatran (PRADAXA) 150 MG CAPS capsule Take 1 capsule (150 mg total) by mouth every 12 (twelve) hours.  180 capsule  2  . dicyclomine (BENTYL) 10 MG capsule TAKE ONE CAPSULE TWICE A DAY.      Marland Kitchen escitalopram (LEXAPRO) 20 MG tablet Take 20 mg by mouth daily.       Marland Kitchen estrogens, conjugated, (PREMARIN) 0.3 MG tablet Take 0.3 mg by mouth daily.       . furosemide (LASIX) 20 MG tablet Take 20 mg by mouth as needed.      Marland Kitchen HYDROcodone-acetaminophen (NORCO/VICODIN) 5-325 MG per tablet Take 1 tablet by mouth every 6 (six) hours as needed.      Marland Kitchen  levothyroxine (SYNTHROID, LEVOTHROID) 112 MCG tablet Take 112 mcg by mouth daily before breakfast.      . losartan (COZAAR) 50 MG tablet Take 1 tablet (50 mg total) by mouth 2 (two) times daily.  180 tablet  3  . magnesium oxide (MAG-OX) 400 (241.3 MG) MG tablet Take 1 tablet (400 mg total) by mouth daily.  30 tablet  5  . propafenone (RYTHMOL SR) 325 MG 12 hr capsule Take 1 capsule (325 mg total) by mouth 2 (two) times daily.  180 capsule  2  . traZODone (DESYREL) 150 MG tablet Take 150 mg by mouth at bedtime.        . zaleplon (SONATA) 10 MG capsule Take 10 mg by mouth at bedtime.        No current facility-administered medications for this visit.    Allergies  Allergen Reactions  . Penicillins Anaphylaxis  . Epinephrine Other (See Comments)    Causes A-Fib  . Metoprolol     alopecia  .  Lidocaine Palpitations    Or any similar medication    History   Social History  . Marital Status: Married    Spouse Name: N/A    Number of Children: 3  . Years of Education: N/A   Occupational History  . retired    Social History Main Topics  . Smoking status: Former Games developer  . Smokeless tobacco: Never Used     Comment: Quit 15 years ago  . Alcohol Use: 3.6 oz/week    6 Glasses of wine per week     Comment: daily  . Drug Use: No  . Sexual Activity: Yes   Other Topics Concern  . Not on file   Social History Narrative   Lives in [place],  Former [facility] [employer],  Separated.      Family History  Problem Relation Age of Onset  . Anesthesia problems Mother   . Colon cancer Neg Hx   . Colon polyps Mother     beign    ROS- All systems are reviewed and negative except as per the HPI above  Physical Exam: Filed Vitals:   10/13/13 1421  BP: 109/66  Pulse: 64  Height: 5\' 5"  (1.651 m)  Weight: 154 lb (69.854 kg)    GEN- The patient is well appearing, alert and oriented x 3 today.   Head- normocephalic, atraumatic Eyes-  Sclera clear, conjunctiva pink Ears- hearing intact Oropharynx- clear Neck- supple, no JVP Lymph- no cervical lymphadenopathy Lungs- Clear to ausculation bilaterally, normal work of breathing Heart- Regular rate and rhythm, no murmurs, rubs or gallops, PMI not laterally displaced GI- soft, NT, ND, + BS Extremities- no clubbing, cyanosis, or edema MS- no significant deformity or atrophy Skin- no rash or lesion Psych- euthymic mood, full affect Neuro- strength and sensation are intact  EKG today reveals atrial pacing, PR 184 msec, poor R wave progression Epic records including Dr notes and recent hospital records are reviewed PPM interrogation today is reviewed (See paceart).  Afib burden is 2.4%   Assessment and Plan:  1. Paroxysmal atrial fibrillation The patient has symptomatic recurrent paroxysmal atrial fibrillation.   Her afib is reasonably controlled with propafenone.  Her Chads2vasc score is at least 3.  She is appropriately anticoagulated with pradaxa. Therapeutic strategies for afib including medicine and ablation were discussed in detail with the patient today. Risk, benefits, and alternatives to EP study and radiofrequency ablation for afib were also discussed in detail today. She is clear that  she does not wish to pursue ablation at this time.  She would prefer to continue her current medicine strategy without adjustment.  I have therefore made no changes today.  2. Tachy/brady syndrome Normal pacemaker function See Pace Art report No changes today  3. HTN Stable No change required today  She will follow-up with Dr [name] as scheduled and I will see as needed going forward

## 2013-10-17 ENCOUNTER — Inpatient Hospital Stay (HOSPITAL_COMMUNITY): Admission: RE | Admit: 2013-10-17 | Payer: Medicare Other | Source: Ambulatory Visit

## 2013-10-21 ENCOUNTER — Encounter: Payer: Self-pay | Admitting: Internal Medicine

## 2013-10-21 ENCOUNTER — Ambulatory Visit (INDEPENDENT_AMBULATORY_CARE_PROVIDER_SITE_OTHER): Payer: Medicare Other | Admitting: Internal Medicine

## 2013-10-21 VITALS — BP 100/70 | HR 66 | Ht 65.0 in | Wt 155.6 lb

## 2013-10-21 DIAGNOSIS — R197 Diarrhea, unspecified: Secondary | ICD-10-CM

## 2013-10-21 DIAGNOSIS — K589 Irritable bowel syndrome without diarrhea: Secondary | ICD-10-CM

## 2013-10-21 MED ORDER — DICYCLOMINE HCL 10 MG PO CAPS: 10.0000 mg | ORAL_CAPSULE | Freq: Three times a day (TID) | ORAL | Status: DC

## 2013-10-21 NOTE — Patient Instructions (Signed)
Please discontinue magnesium oxide.  We have sent the following medications to your pharmacy for you to pick up at your convenience: Bentyl 10 mg three times daily  CC:Dr Thayer Headings

## 2013-10-21 NOTE — Progress Notes (Signed)
Jacqueline Kim [birth year] [id number]  Note: This dictation was prepared with Dragon digital system. Any transcriptional errors that result from this procedure are unintentional.   History of Present Illness: This is a 68 year old white female with  severe irritable bowel syndrome. Comes for refills of dicyclomine. She has a history of paroxysmal atrial fibrillation for which she takes Pradaxa. Last colonoscopy in May 2008 showed mild diverticulosis. Last CT scan of the abdomen in 2012 showed no active process. She continues to have a crampy abdominal pain and constipation alternating with diarrhea. She has been on magnesium oxide 400 mg daily which she does not realize is a laxative. She denies  rectal bleeding. She has intentionally lost about 10 pounds. She takes Bentyl 10 mg twice a day with the some relief but ran out of it 3 days ago. Today she was late for her appointment because of severe crampy abdominal pain just before she left the house. She had to sit down on her bed and wit till  her cramps eased up. .    Past Medical History  Diagnosis Date  . Paroxysmal a-fib diagnosed 2008    a. On propafenone, Pradaxa.  . 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 Hypothyroid   . Anxiety   . Hypertension   . Glaucoma   . Complication of anesthesia     DIFFICULTY WAKING UP  . Arthritis   . Anticoagulant long-term use  WITH PRADAXA 05/27/2011  . DJD (degenerative joint disease)   . GERD (gastroesophageal reflux disease)   . IBS (irritable bowel syndrome)   . Depression   . Diverticulosis   . External hemorrhoids   . Esophagitis 09/17/06  . History of stress test     a. 2008 - normal nuc.    Past Surgical History  Procedure Laterality Date  . Pacemaker insertion  04/08/11    MDT Revo implanted by Dr Rubie Maid  . Eye surgery Bilateral     cataracts  . Vaginal hysterectomy    . Liposuction    . Ovarian cyst removal    . Foot surgery    . Hand  surgery      multiple  . Breast reduction surgery    . Facial cosmetic surgery    . Tonsillectomy    . Nm myocar perf wall motion  12/29/2006    No significant ischemia; EF 69%    Allergies  Allergen Reactions  . Penicillins Anaphylaxis  . Epinephrine Other (See Comments)    Causes A-Fib  . Metoprolol     alopecia  . Lidocaine Palpitations    Or any similar medication    Family history and social history have been reviewed.  Review of Systems:   The remainder of the 10 point ROS is negative except as outlined in the H&P  Physical Exam: General Appearance Well developed, in no distress Eyes  Non icteric  HEENT  Non traumatic, normocephalic  Mouth No lesion, tongue papillated, no cheilosis Neck Supple without adenopathy, thyroid not enlarged, no carotid bruits, no JVD Lungs Clear to auscultation bilaterally COR Normal S1, normal S2, regular rhythm, no murmur, quiet precordium Abdomen soft nontender abdomen with hyperactive bowel sounds. No distention or tympany. Rectal not done Extremities  No pedal edema Skin No lesions Neurological Alert and oriented x 3 Psychological Normal mood and affect  Assessment and Plan:   68 year old white female with irritable bowel syndrome with predominant diarrhea and  crampy abdominal pain. We  will refill Bentyl and will increase the dose to 10 mg 3 times a day. If the symptoms continue consider repeat colonoscopy to rule out microscopic colitis. Shewill discontinue magnesium oxide for now and take it only for  constipation    Hart Carwin 10/21/2013

## 2013-10-22 ENCOUNTER — Encounter: Payer: Self-pay | Admitting: Internal Medicine

## 2013-10-24 ENCOUNTER — Ambulatory Visit (HOSPITAL_COMMUNITY)
Admission: RE | Admit: 2013-10-24 | Discharge: 2013-10-24 | Disposition: A | Discharge status: Home / Self Care | Payer: Medicare Other | Source: Ambulatory Visit | Attending: Internal Medicine | Admitting: Internal Medicine

## 2013-10-24 DIAGNOSIS — I369 Nonrheumatic tricuspid valve disorder, unspecified: Secondary | ICD-10-CM

## 2013-10-24 DIAGNOSIS — I4891 Unspecified atrial fibrillation: Secondary | ICD-10-CM

## 2013-10-24 NOTE — Progress Notes (Signed)
2D Echo Performed 10/24/2013    Kista Robb, RCS  

## 2013-11-09 ENCOUNTER — Encounter: Payer: Self-pay | Admitting: Cardiovascular Disease

## 2013-12-08 ENCOUNTER — Encounter: Payer: Self-pay | Admitting: Cardiovascular Disease

## 2013-12-08 ENCOUNTER — Ambulatory Visit (INDEPENDENT_AMBULATORY_CARE_PROVIDER_SITE_OTHER): Payer: Medicare Other | Admitting: Cardiovascular Disease

## 2013-12-08 VITALS — BP 147/80 | HR 62 | Ht 65.0 in | Wt 155.0 lb

## 2013-12-08 DIAGNOSIS — E039 Hypothyroidism, unspecified: Secondary | ICD-10-CM

## 2013-12-08 DIAGNOSIS — Z950 Presence of cardiac pacemaker: Secondary | ICD-10-CM

## 2013-12-08 DIAGNOSIS — I495 Sick sinus syndrome: Secondary | ICD-10-CM

## 2013-12-08 DIAGNOSIS — I10 Essential (primary) hypertension: Secondary | ICD-10-CM

## 2013-12-08 DIAGNOSIS — I4891 Unspecified atrial fibrillation: Secondary | ICD-10-CM

## 2013-12-08 DIAGNOSIS — Z7901 Long term (current) use of anticoagulants: Secondary | ICD-10-CM

## 2013-12-08 NOTE — Patient Instructions (Signed)
Your physician recommends that you schedule a follow-up appointment in: 6 months. No changes were made today in your therapy. 

## 2013-12-09 ENCOUNTER — Encounter: Payer: Self-pay | Admitting: Cardiovascular Disease

## 2013-12-09 NOTE — Progress Notes (Signed)
Patient ID: Jacqueline Kim, female   DOB: 21-Jul-1945, 68 y.o.   MRN: [id number]      HPI: Jacqueline Kim is a 68 y.o. female  who presents for follow-up cardiologic evaluation.  Since I last saw her she did see Dr. Johney Frame for EP evaluation.  Jacqueline Kim has known history of sinus node dysfunction. She has history of paroxysmal atrial fibrillation. She is status post permanent pacemaker implanted on 04/08/2011 Medtronic Revo MRI compatible pacemaker. She also has a history of hypothyroidism. She has had recurrent episodes of paroxysmal atrial fibrillation and has been on pradaxa anticoagulation for several years after initially having been on warfarin.  Most recently she's been maintained on rhythmol SR 325 mg twice a day. She also is on atenolol 50 mg daily. She had been on diltiazem but this was discontinued by Dr. Alanda Amass. She also is on losartan 50 mg for hypertension. He last saw Dr. Alanda Amass in August 2014 at which time she was doing well. She had been hospitalized in January 2014 with recurrent AF in the setting of significant increased family stress. She is now separated and living in a different household from her husband Dr. [name].  Jacqueline Kim states that she did feel her heart rate becomes irregular and fast on 03/12/2013. This lasted approximately 12 hours.  Her last episode of PAF on the Saturday before Easter when she was rushing with family members and trying to prepare dinner.  She was hospitalized and presented with a heart rate at 150.  She was started on IV Cardizem for rate control.  She spontaneously cardioverted to sinus rhythm on April 6 prior to a planned TEE cardioversion.  I saw her in followup of that hospitalization, I did suggest an EP evaluation.  She subsequently did see Dr. Johney Frame for discussion concerning atrial fibrillation ablation.  Apparently, at that time, the patient was feeling well.  She opted against pursuing AF ablation presently.  She presents now for followup  evaluation.  She has not had any further episodes of AF since April.  She feels that there is less stress in her life now.  She is living in [facility].  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  She is still working on her book and is hopeful that this is a final preparation prior to trying to get it published.  Did have a followup echo Doppler study on 10/24/2013.  He showed an ejection fraction of 60-65% and there was evidence for grade 1 diastolic function without wall motion abnormalities.  Her left atrial dimension was normal.  She presents now for evaluation.   Past Medical History  Diagnosis Date  . Paroxysmal a-fib diagnosed 2008    a. On propafenone, Pradaxa.  . 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 Hypothyroid   . Anxiety   . Hypertension   . Glaucoma   . Complication of anesthesia     DIFFICULTY WAKING UP  . Arthritis   . Anticoagulant long-term use  WITH PRADAXA 05/27/2011  . DJD (degenerative joint disease)   . GERD (gastroesophageal reflux disease)   . IBS (irritable bowel syndrome)   . Depression   . Diverticulosis   . External hemorrhoids   . Esophagitis 09/17/06  . History of stress test     a. 2008 - normal nuc.    Past Surgical History  Procedure Laterality  Date  . Pacemaker insertion  04/08/11    MDT Revo implanted by Dr Rubie Maid  . Eye surgery Bilateral     cataracts  . Vaginal hysterectomy    . Liposuction    . Ovarian cyst removal    . Foot surgery    . Hand surgery      multiple  . Breast reduction surgery    . Facial cosmetic surgery    . Tonsillectomy    . Nm myocar perf wall motion  12/29/2006    No significant ischemia; EF 69%    Allergies  Allergen Reactions  . Penicillins Anaphylaxis  . Epinephrine Other (See Comments)    Causes A-Fib  . Metoprolol     alopecia  . Lidocaine Palpitations    Or any similar medication     Current Outpatient Prescriptions  Medication Sig Dispense Refill  . atenolol (TENORMIN) 50 MG tablet Take 1 tablet (50 mg total) by mouth daily.  90 tablet  2  . Biotin 5000 MCG TABS Take 1 tablet by mouth daily.      . CELEBREX 200 MG capsule Take 200 mg by mouth as needed.       . conjugated estrogens (PREMARIN) vaginal cream Place 1.5 g vaginally daily as needed.       . dabigatran (PRADAXA) 150 MG CAPS capsule Take 1 capsule (150 mg total) by mouth every 12 (twelve) hours.  180 capsule  2  . dicyclomine (BENTYL) 10 MG capsule Take 1 capsule (10 mg total) by mouth 3 (three) times daily. TAKE ONE CAPSULE TWICE A DAY.  90 capsule  2  . escitalopram (LEXAPRO) 20 MG tablet Take 20 mg by mouth daily.       Marland Kitchen estrogens, conjugated, (PREMARIN) 0.3 MG tablet Take 0.3 mg by mouth daily.       . furosemide (LASIX) 20 MG tablet Take 20 mg by mouth as needed.      Marland Kitchen HYDROcodone-acetaminophen (NORCO/VICODIN) 5-325 MG per tablet Take 1 tablet by mouth every 6 (six) hours as needed.      Marland Kitchen levothyroxine (SYNTHROID, LEVOTHROID) 112 MCG tablet Take 112 mcg by mouth daily before breakfast.      . losartan (COZAAR) 50 MG tablet Take 1 tablet (50 mg total) by mouth 2 (two) times daily.  180 tablet  3  . propafenone (RYTHMOL SR) 325 MG 12 hr capsule Take 1 capsule (325 mg total) by mouth 2 (two) times daily.  180 capsule  2  . traZODone (DESYREL) 150 MG tablet Take 150 mg by mouth at bedtime.        . zaleplon (SONATA) 10 MG capsule Take 10 mg by mouth at bedtime.        No current facility-administered medications for this visit.    History   Social History  . Marital Status: Legally Separated    Spouse Name: N/A    Number of Children: 3  . Years of Education: N/A   Occupational History  . retired    Social History Main Topics  . Smoking status: Former Games developer  . Smokeless tobacco: Never Used     Comment: Quit 15 years ago  . Alcohol Use: 3.6 oz/week    6 Glasses of wine per week      Comment: daily  . Drug Use: No  . Sexual Activity: Yes   Other Topics Concern  . Not on file   Social History Narrative   Lives in [place],  Former [employer],  Separated.      Family History  Problem Relation Age of Onset  . Anesthesia problems Mother   . Colon cancer Neg Hx   . Colon polyps Mother     beign   ROS General: Negative; No fevers, chills, or night sweats;  HEENT: Negative; No changes in vision or hearing, sinus congestion, difficulty swallowing Pulmonary: Negative; No cough, wheezing, shortness of breath, hemoptysis Cardiovascular: Negative; No chest pain, presyncope, syncope, palpatations; no awareness of her current AF GI: Negative; No nausea, vomiting, diarrhea, or abdominal pain GU: Negative; No dysuria, hematuria, or difficulty voiding Musculoskeletal: Negative; no myalgias, joint pain, or weakness Hematologic/Oncology: Negative; no easy bruising, bleeding Endocrine: Negative; no heat/cold intolerance; no diabetes Neuro: Negative; no changes in balance, headaches Skin: Negative; No rashes or skin lesions Psychiatric: There is less stress and anxiety; No behavioral problems, depression Sleep: Negative; No snoring, daytime sleepiness, hypersomnolence, bruxism, restless legs, hypnogognic hallucinations, no cataplexy Other comprehensive 14 point system review is negative.   PE BP 147/80  Pulse 62  Ht 5\' 5"  (1.651 m)  Wt 155 lb (70.308 kg)  BMI 25.79 kg/m2  General: Alert, oriented, no distress.  Skin: normal turgor, no rashes HEENT: Normocephalic, atraumatic. Pupils round and reactive; sclera anicteric;no lid lag.  Nose without nasal septal hypertrophy Mouth/Parynx benign; Mallinpatti scale 2 Neck: No JVD, no carotid bruits with normal carotid upstroke. Lungs: clear to ausculatation and percussion; no wheezing or rales Chest wall: Nontender to palpation Heart: PMI is not displaced RRR, s1 s2 normal 1/6 systolic murmur; no S3 or S4 gallop.   No rubs, thrills or heaves. Abdomen: soft, nontender; no hepatosplenomehaly, BS+; abdominal aorta nontender and not dilated by palpation. Back: No CVA tenderness Pulses 2+ Extremities: no clubbing cyanosis or edema, Homan's sign negative  Neurologic: grossly nonfocal Psychologic: normal affect and mood.  ECG (independently read by me): Atrially paced rhythm at 62 beats per minute with prolonged AV conduction with a PR interval of 244 Jacqueline.  QTc interval 444 Jacqueline.  Prior 09/22/2013 ECG (independently read by me): Atrial paced rhythm at 63 beats per minute with ventricular sensing.   LABS:  BMET    Component Value Date/Time   NA 139 09/17/2013 2010   K 4.1 09/17/2013 2010   CL 98 09/17/2013 2010   CO2 22 09/17/2013 2010   GLUCOSE 85 09/17/2013 2010   BUN 21 09/17/2013 2010   CREATININE 0.99 09/17/2013 2010   CREATININE 1.05 02/23/2013 1550   CALCIUM 9.6 09/17/2013 2010   GFRNONAA 56* 09/17/2013 2010   GFRAA 65* 09/17/2013 2010     Hepatic Function Panel     Component Value Date/Time   PROT 6.3 02/03/2013 1549   ALBUMIN 3.9 02/03/2013 1549   AST 26 02/03/2013 1549   ALT 14 02/03/2013 1549   ALKPHOS 36* 02/03/2013 1549   BILITOT 0.3 02/03/2013 1549   BILIDIR <0.1 04/01/2011 1147   IBILI NOT CALCULATED 04/01/2011 1147     CBC    Component Value Date/Time   WBC 6.6 09/17/2013 2010   RBC 3.87 09/17/2013 2010   HGB 12.4 09/17/2013 2010   HCT 36.9 09/17/2013 2010   PLT 234 09/17/2013 2010   MCV 95.3 09/17/2013 2010   MCH 32.0 09/17/2013 2010   MCHC 33.6 09/17/2013 2010   RDW 13.3 09/17/2013 2010   LYMPHSABS 1.1 02/03/2013 1549   MONOABS 0.5 02/03/2013 1549   EOSABS 0.2 02/03/2013 1549   BASOSABS 0.0 02/03/2013 1549     BNP  Component Value Date/Time   PROBNP 1110.0* 09/18/2013 0332    Lipid Panel     Component Value Date/Time   CHOL  Value: 172        ATP III CLASSIFICATION:  <200     mg/dL   Desirable  161-096200-239  mg/dL   Borderline High  >=045>=240    mg/dL   High 4/0/98117/10/2006 [id number]   TRIG 293* 12/19/2006 0315    HDL 37* 12/19/2006 0315   CHOLHDL 4.6 12/19/2006 0315   VLDL 59* 12/19/2006 0315   LDLCALC  Value: 76        Total Cholesterol/HDL:CHD Risk Coronary Heart Disease Risk Table                     Men   Women  1/2 Average Risk   3.4   3.3 12/19/2006 0315     RADIOLOGY: No results found.    ASSESSMENT AND PLAN: Jacqueline Kim has a history of PAF and status post permanent pacemaker for sinus node dysfunction. She is anticoagulated with her Pradaxa and has a Chads2vasc score of at least 3.  I did review her echo Doppler study, which was done on 10/24/2013.  This continued to show normal systolic function with an ejection fraction of 60-65%.  There was grade 1 diastolic dysfunction.  There were no regional wall motion abnormalities.  The left atrium was normal in size.  She did see Dr. Johney FrameAllred.  Presently, she feels that she is doing better and has less stress in her life.  Previously, her recurrence of AF T. typically have been in situations where there has been significantly increased stress and anxiety.  She would prefer to stand.  Her current therapy.  I did discuss with her that if she did feel more palpitations.  She can take an extra atenolol at the start of this since we do not have to worry about her heart rate getting too slow with her pacemaker in place.  Her blood pressure is stable on current therapy although initially today was elevated, but she had not taken her morning dose of atenolol.  If she does develop recurrent AF, it may be worthwhile to consider switching her to, flecainide.  We'll consider Tikosyn if she is not wishing to pursue atrial fibrillation ablation.  As long as she remains, stable we'll see her in 6 months for followup evaluation.  Lennette Biharihomas A. Kelly, MD, [facility]  12/09/2013 9:44 AM

## 2014-01-23 ENCOUNTER — Other Ambulatory Visit (HOSPITAL_COMMUNITY): Payer: Self-pay | Admitting: Cardiology

## 2014-01-23 NOTE — Telephone Encounter (Signed)
Rx refill denied to patient pharmacy   

## 2014-02-06 ENCOUNTER — Other Ambulatory Visit: Payer: Self-pay | Admitting: Cardiovascular Disease

## 2014-02-06 NOTE — Telephone Encounter (Signed)
Rx refill sent to patient pharmacy   

## 2014-03-03 ENCOUNTER — Telehealth: Payer: Self-pay | Admitting: Cardiovascular Disease

## 2014-03-03 NOTE — Telephone Encounter (Signed)
Message sent to scheduler to set up appointment. Patient was agreeable to seeing an APP (next week), depending on who had soonest available appointment.

## 2014-03-03 NOTE — Telephone Encounter (Signed)
Returned call to patient. Complaints are as follows:  1. 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 Lightheadedness with change in position (as in when she stands up) 3. She reports while doing housework, taking out trash, etc that she got SOB and she got worried 4. She reports her BP was low last week when she took it at drug store 99/70?, today was 176/99 (before meds) 5. Chest tightness, but she thinks is related to her back 6. She reports O2 of 98% (taken at her place of work)  Patient would like to see Dr. [name] in the office next week if possible.   She is scheduled for a device check on 10/13 (she missed her 3 month check)

## 2014-03-03 NOTE — Telephone Encounter (Signed)
Jacqueline Kim is calling because she is having some shortness of breath and sweating , and a little dizziness, states that her oxygen level is high , please call   Thanks

## 2014-03-06 ENCOUNTER — Ambulatory Visit (INDEPENDENT_AMBULATORY_CARE_PROVIDER_SITE_OTHER): Payer: Medicare Other | Admitting: Physician Assistant

## 2014-03-06 ENCOUNTER — Encounter: Payer: Self-pay | Admitting: Physician Assistant

## 2014-03-06 ENCOUNTER — Other Ambulatory Visit: Payer: Self-pay | Admitting: Internal Medicine

## 2014-03-06 VITALS — BP 114/78 | HR 61 | Ht 66.0 in | Wt 154.2 lb

## 2014-03-06 DIAGNOSIS — I480 Paroxysmal atrial fibrillation: Secondary | ICD-10-CM

## 2014-03-06 DIAGNOSIS — Z7901 Long term (current) use of anticoagulants: Secondary | ICD-10-CM

## 2014-03-06 DIAGNOSIS — I4891 Unspecified atrial fibrillation: Secondary | ICD-10-CM

## 2014-03-06 DIAGNOSIS — I10 Essential (primary) hypertension: Secondary | ICD-10-CM

## 2014-03-06 DIAGNOSIS — R5383 Other fatigue: Secondary | ICD-10-CM | POA: Insufficient documentation

## 2014-03-06 DIAGNOSIS — R5381 Other malaise: Secondary | ICD-10-CM

## 2014-03-06 NOTE — Assessment & Plan Note (Signed)
Well controlled 

## 2014-03-06 NOTE — Assessment & Plan Note (Signed)
Maintaining NSR 61 beats per minute

## 2014-03-06 NOTE — Progress Notes (Signed)
Date:  03/06/2014   ID:  [name], DOB January 29, 1946, MRN [id number]  PCP:  Thayer Headings, MD  Primary Cardiologist:  [name]    History of Present Illness: Jacqueline Kim is a 68 y.o. female with a history of sinus node dysfunction. She has history of paroxysmal atrial fibrillation for which she takes propafanone. She is status post permanent pacemaker which was implanted on 04/08/2011 [employer] refill MRI compatible pacemaker. She also has a history of hypothyroidism. Years, she has had recurrent episodes of atrial fibrillation. She's on anticoagulation with pradaxa.  She also is on atenolol 50 mg daily.She had been hospitalized in January 2014 with recurrent AF in the setting of significant increased family stress.     Patient presents today for evaluation of fatigue. She states for the last 2 weeks she is still tired and perhaps feels a little more short of breath when walking up 2 flights of stairs to her apartment. She denies any chest pain when this happens. She also denies any orthopnea PND or lower extremity edema. 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 She reports over the weekend she actually felt better. She also denies nausea, vomiting, fever,  dizziness, cough, congestion, abdominal pain, hematochezia, melena, lower extremity edema, claudication.  Wt Readings from Last 3 Encounters:  03/06/14 154 lb 3.2 oz (69.945 kg)  12/08/13 155 lb (70.308 kg)  10/21/13 155 lb 9.6 oz (70.58 kg)     Past Medical History  Diagnosis Date  . Paroxysmal a-fib diagnosed 2008    a. On propafenone, Pradaxa.  . 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 Hypothyroid   . Anxiety   . Hypertension   . Glaucoma   . Complication of anesthesia     DIFFICULTY WAKING UP  . Arthritis   . Anticoagulant long-term use  WITH PRADAXA 05/27/2011  . DJD (degenerative joint disease)   . GERD (gastroesophageal reflux disease)   . IBS (irritable bowel  syndrome)   . Depression   . Diverticulosis   . External hemorrhoids   . Esophagitis 09/17/06  . History of stress test     a. 2008 - normal nuc.    Current Outpatient Prescriptions  Medication Sig Dispense Refill  . atenolol (TENORMIN) 50 MG tablet Take 1 tablet (50 mg total) by mouth daily.  90 tablet  2  . Biotin 5000 MCG TABS Take 1 tablet by mouth daily.      . CELEBREX 200 MG capsule Take 200 mg by mouth as needed.       . conjugated estrogens (PREMARIN) vaginal cream Place 1.5 g vaginally daily as needed.       . dicyclomine (BENTYL) 10 MG capsule Take 1 capsule (10 mg total) by mouth 3 (three) times daily before meals.  90 capsule  2  . escitalopram (LEXAPRO) 20 MG tablet Take 20 mg by mouth daily.       Marland Kitchen estrogens, conjugated, (PREMARIN) 0.3 MG tablet Take 0.3 mg by mouth daily.       . furosemide (LASIX) 20 MG tablet Take 20 mg by mouth as needed.      Marland Kitchen HYDROcodone-acetaminophen (NORCO/VICODIN) 5-325 MG per tablet Take 1 tablet by mouth every 6 (six) hours as needed.      Marland Kitchen levothyroxine (SYNTHROID, LEVOTHROID) 112 MCG tablet Take 112 mcg by mouth daily before breakfast.      . losartan (COZAAR) 50 MG tablet Take 1  tablet (50 mg total) by mouth 2 (two) times daily.  180 tablet  3  . PRADAXA 150 MG CAPS capsule TAKE ONE CAPSULE EVERY 12 HOURS  180 capsule  1  . propafenone (RYTHMOL SR) 325 MG 12 hr capsule Take 1 capsule (325 mg total) by mouth 2 (two) times daily.  180 capsule  2  . traZODone (DESYREL) 150 MG tablet Take 150 mg by mouth at bedtime.        . zaleplon (SONATA) 10 MG capsule Take 10 mg by mouth at bedtime.        No current facility-administered medications for this visit.    Allergies:    Allergies  Allergen Reactions  . Penicillins Anaphylaxis  . Epinephrine Other (See Comments)    Causes A-Fib  . Metoprolol     alopecia  . Lidocaine Palpitations    Or any similar medication    Social History:  The patient  reports that she has quit smoking. She has  never used smokeless tobacco. She reports that she drinks about 3.6 ounces of alcohol per week. She reports that she does not use illicit drugs.   Family history:   Family History  Problem Relation Age of Onset  . Anesthesia problems Mother   . Colon cancer Neg Hx   . Colon polyps Mother     beign    ROS:  Please see the history of present illness.  All other systems reviewed and negative.   PHYSICAL EXAM: VS:  BP 114/78  Pulse 61  Ht 5\' 6"  (1.676 m)  Wt 154 lb 3.2 oz (69.945 kg)  BMI 24.90 kg/m2 Well nourished, well developed, in no acute distress HEENT: Pupils are equal round react to light accommodation extraocular movements are intact.  Neck: no JVDNo cervical lymphadenopathy. Cardiac: Regular rate and rhythm without murmurs rubs or gallops. Lungs:  clear to auscultation bilaterally, no wheezing, rhonchi or rales Abd: soft, nontender, positive bowel sounds all quadrants, no hepatosplenomegaly Ext: no lower extremity edema.  2+ radial and dorsalis pedis pulses.  No cyanosis or clubbing. Conjunctiva are pink. Skin: warm and dry Neuro:  Grossly normal  EKG:  Normal sinus rhythm with a rate of 61 beats per minute  ASSESSMENT AND PLAN:  Problem List Items Addressed This Visit   Paroxysmal a-fib, recurrent 07/13/12- (on Propafenone)  (Chronic)     Maintaining NSR 61 beats per minute    Anticoagulant long-term use  WITH PRADAXA (Chronic)   HTN (hypertension)     Well controlled    Fatigue     No signs of anemia, CHF, ischemia.  She felt better over the weekend.  I think she is probably just deconditioned.  Recommended working up to 60 minutes of cardiovascular work daily and also doing 2-3 days of weight training.  She has not been getting much sunlight and vitamin D may be helpful. She is due for labs for annual physical in October. I do not think it is required now.      Other Visit Diagnoses   PAF (paroxysmal atrial fibrillation)    -  Primary    Relevant Orders        EKG 12-Lead

## 2014-03-06 NOTE — Patient Instructions (Signed)
1.  Start exercising!!  You should be doing cardio for 60 everyday.  Work up to it.  Also, 2-3 days a week of weights.  2.  Follow up with Dr. [name]

## 2014-03-06 NOTE — Assessment & Plan Note (Signed)
No signs of anemia, CHF, ischemia.  She felt better over the weekend.  I think she is probably just deconditioned.  Recommended working up to 60 minutes of cardiovascular work daily and also doing 2-3 days of weight training.  She has not been getting much sunlight and vitamin D may be helpful. She is due for labs for annual physical in October. I do not think it is required now.

## 2014-03-17 ENCOUNTER — Other Ambulatory Visit: Payer: Self-pay | Admitting: Cardiovascular Disease

## 2014-03-17 NOTE — Telephone Encounter (Signed)
Rx was sent to pharmacy electronically. 

## 2014-03-24 ENCOUNTER — Other Ambulatory Visit: Payer: Self-pay | Admitting: Cardiovascular Disease

## 2014-03-24 NOTE — Telephone Encounter (Signed)
SPOKE WITH DR [name]. FILL LOSARTAN 50 MG BID, INSTEAD OF ONCE DAY MEDICATION.  INFORMED PHARMACIST - MARY ANN -TO FILL LOSARTAN BID (IT WAS ON FILE FROM 4/15) PATIENT AWARE.

## 2014-03-26 ENCOUNTER — Emergency Department (HOSPITAL_COMMUNITY): Payer: Medicare Other

## 2014-03-26 ENCOUNTER — Encounter (HOSPITAL_COMMUNITY): Payer: Self-pay | Admitting: Emergency Medicine

## 2014-03-26 ENCOUNTER — Emergency Department (HOSPITAL_COMMUNITY)
Admission: EM | Admit: 2014-03-26 | Discharge: 2014-03-26 | Discharge status: Absent without leave | Payer: Medicare Other | Source: Home / Self Care

## 2014-03-26 ENCOUNTER — Emergency Department (HOSPITAL_COMMUNITY)
Admission: EM | Admit: 2014-03-26 | Discharge: 2014-03-26 | Disposition: A | Discharge status: Home / Self Care | Payer: Medicare Other | Attending: Emergency Medicine | Admitting: Emergency Medicine

## 2014-03-26 DIAGNOSIS — I480 Paroxysmal atrial fibrillation: Secondary | ICD-10-CM | POA: Diagnosis not present

## 2014-03-26 DIAGNOSIS — J209 Acute bronchitis, unspecified: Secondary | ICD-10-CM | POA: Insufficient documentation

## 2014-03-26 DIAGNOSIS — Z950 Presence of cardiac pacemaker: Secondary | ICD-10-CM | POA: Insufficient documentation

## 2014-03-26 DIAGNOSIS — I10 Essential (primary) hypertension: Secondary | ICD-10-CM | POA: Insufficient documentation

## 2014-03-26 DIAGNOSIS — R05 Cough: Secondary | ICD-10-CM | POA: Diagnosis present

## 2014-03-26 DIAGNOSIS — M1990 Unspecified osteoarthritis, unspecified site: Secondary | ICD-10-CM | POA: Insufficient documentation

## 2014-03-26 DIAGNOSIS — K589 Irritable bowel syndrome without diarrhea: Secondary | ICD-10-CM | POA: Diagnosis not present

## 2014-03-26 DIAGNOSIS — J40 Bronchitis, not specified as acute or chronic: Secondary | ICD-10-CM

## 2014-03-26 DIAGNOSIS — Z87891 Personal history of nicotine dependence: Secondary | ICD-10-CM | POA: Diagnosis not present

## 2014-03-26 DIAGNOSIS — Z7902 Long term (current) use of antithrombotics/antiplatelets: Secondary | ICD-10-CM | POA: Insufficient documentation

## 2014-03-26 DIAGNOSIS — Z880 Allergy status to penicillin: Secondary | ICD-10-CM | POA: Diagnosis not present

## 2014-03-26 DIAGNOSIS — Z79899 Other long term (current) drug therapy: Secondary | ICD-10-CM | POA: Diagnosis not present

## 2014-03-26 DIAGNOSIS — F419 Anxiety disorder, unspecified: Secondary | ICD-10-CM | POA: Diagnosis not present

## 2014-03-26 DIAGNOSIS — E039 Hypothyroidism, unspecified: Secondary | ICD-10-CM | POA: Insufficient documentation

## 2014-03-26 DIAGNOSIS — F329 Major depressive disorder, single episode, unspecified: Secondary | ICD-10-CM | POA: Diagnosis not present

## 2014-03-26 LAB — CBC
HCT: 34.2 % — ABNORMAL LOW (ref 36.0–46.0)
Hemoglobin: 11.3 g/dL — ABNORMAL LOW (ref 12.0–15.0)
MCH: 31.3 pg (ref 26.0–34.0)
MCHC: 33 g/dL (ref 30.0–36.0)
MCV: 94.7 fL (ref 78.0–100.0)
Platelets: 245 10*3/uL (ref 150–400)
RBC: 3.61 MIL/uL — ABNORMAL LOW (ref 3.87–5.11)
RDW: 12.9 % (ref 11.5–15.5)
WBC: 6.2 10*3/uL (ref 4.0–10.5)

## 2014-03-26 LAB — BASIC METABOLIC PANEL
Anion gap: 15 (ref 5–15)
BUN: 16 mg/dL (ref 6–23)
CO2: 24 mEq/L (ref 19–32)
Calcium: 9.2 mg/dL (ref 8.4–10.5)
Chloride: 99 mEq/L (ref 96–112)
Creatinine, Ser: 0.91 mg/dL (ref 0.50–1.10)
GFR calc Af Amer: 72 mL/min — ABNORMAL LOW (ref >90)
GFR calc non Af Amer: 62 mL/min — ABNORMAL LOW (ref >90)
Glucose, Bld: 84 mg/dL (ref 70–99)
Potassium: 4.5 mEq/L (ref 3.7–5.3)
Sodium: 138 mEq/L (ref 137–147)

## 2014-03-26 LAB — I-STAT TROPONIN, ED: Troponin i, poc: 0 ng/mL (ref 0.00–0.08)

## 2014-03-26 MED ORDER — BENZONATATE 100 MG PO CAPS: 100.0000 mg | ORAL_CAPSULE | Freq: Three times a day (TID) | ORAL | Status: DC

## 2014-03-26 MED ORDER — PREDNISONE 10 MG PO TABS: 20.0000 mg | ORAL_TABLET | Freq: Every day | ORAL | Status: DC

## 2014-03-26 MED ORDER — PREDNISONE 20 MG PO TABS
60.0000 mg | ORAL_TABLET | Freq: Once | ORAL | Status: AC
  Administered 2014-03-26: 60 mg via ORAL
  Filled 2014-03-26: qty 3

## 2014-03-26 MED ORDER — DOXYCYCLINE HYCLATE 100 MG PO CAPS: 100.0000 mg | ORAL_CAPSULE | Freq: Two times a day (BID) | ORAL | Status: DC

## 2014-03-26 MED ORDER — LEVALBUTEROL TARTRATE 45 MCG/ACT IN AERO: 2.0000 | INHALATION_SPRAY | Freq: Four times a day (QID) | RESPIRATORY_TRACT | Status: DC

## 2014-03-26 MED ORDER — ALBUTEROL SULFATE HFA 108 (90 BASE) MCG/ACT IN AERS: 2.0000 | INHALATION_SPRAY | Freq: Once | RESPIRATORY_TRACT | Status: DC

## 2014-03-26 MED ORDER — LEVALBUTEROL TARTRATE 45 MCG/ACT IN AERO: 2.0000 | INHALATION_SPRAY | RESPIRATORY_TRACT | Status: DC | PRN

## 2014-03-26 NOTE — ED Provider Notes (Signed)
CSN: [id number]     Arrival date & time 03/26/14  1543 History   First MD Initiated Contact with Patient 03/26/14 1847     Chief Complaint  Patient presents with  . Cough  . Chest Pain     (Consider location/radiation/quality/duration/timing/severity/associated sxs/prior Treatment) HPI Jacqueline Kim is a 68 y.o. female with history of A. fib, tachybrady syndrome, hypertension, anxiety, who presents to emergency department complaining of cough and congestion. Patient states she developed body aches, nasal congestion, sore throat, cough 4 days ago. Patient states that she has not been taking anything for her symptoms. She is a former smoker. States she's coughing up thick green sputum. She denies any shortness of breath. She states she is having chest pain but only when she's coughing. Denies any back pain. No fever but admits to chills. No recent travel or surgeries. No swelling in her extremities. States her mother just got over pneumonia last week. She is worried that she may have the same.  Past Medical History  Diagnosis Date  . Paroxysmal a-fib diagnosed 2008    a. On propafenone, Pradaxa.  . 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 Hypothyroid   . Anxiety   . Hypertension   . Glaucoma   . Complication of anesthesia     DIFFICULTY WAKING UP  . Arthritis   . Anticoagulant long-term use  WITH PRADAXA 05/27/2011  . DJD (degenerative joint disease)   . GERD (gastroesophageal reflux disease)   . IBS (irritable bowel syndrome)   . Depression   . Diverticulosis   . External hemorrhoids   . Esophagitis 09/17/06  . History of stress test     a. 2008 - normal nuc.   Past Surgical History  Procedure Laterality Date  . Pacemaker insertion  04/08/11    MDT Revo implanted by Dr Rubie Maid  . Eye surgery Bilateral     cataracts  . Vaginal hysterectomy    . Liposuction    . Ovarian cyst removal    . Foot surgery    . Hand surgery      multiple   . Breast reduction surgery    . Facial cosmetic surgery    . Tonsillectomy    . Nm myocar perf wall motion  12/29/2006    No significant ischemia; EF 69%   Family History  Problem Relation Age of Onset  . Anesthesia problems Mother   . Colon cancer Neg Hx   . Colon polyps Mother     beign   History  Substance Use Topics  . Smoking status: Former Games developer  . Smokeless tobacco: Never Used     Comment: Quit 15 years ago  . Alcohol Use: 3.6 oz/week    6 Glasses of wine per week     Comment: daily   OB History   Grav Para Term Preterm Abortions TAB SAB Ect Mult Living                 Review of Systems  Constitutional: Positive for chills. Negative for fever.  HENT: Positive for congestion and sore throat. Negative for ear pain.   Respiratory: Positive for cough. Negative for chest tightness, shortness of breath, wheezing and stridor.   Cardiovascular: Positive for chest pain. Negative for palpitations and leg swelling.  Gastrointestinal: Negative for nausea and abdominal pain.  Genitourinary: Negative for dysuria.  Musculoskeletal: Negative for arthralgias and myalgias.  Skin: Negative for rash.  Neurological: Negative for dizziness,  weakness and numbness.  All other systems reviewed and are negative.     Allergies  Penicillins; Epinephrine; Metoprolol; and Lidocaine  Home Medications   Prior to Admission medications   Medication Sig Start Date End Date Taking? Authorizing Provider  atenolol (TENORMIN) 50 MG tablet Take 1 tablet (50 mg total) by mouth daily. 07/29/13   Chrystie Nose, MD  Biotin 5000 MCG TABS Take 1 tablet by mouth daily.    Historical Provider, MD  CELEBREX 200 MG capsule Take 200 mg by mouth as needed.  10/28/12   Historical Provider, MD  conjugated estrogens (PREMARIN) vaginal cream Place 1.5 g vaginally daily as needed.     Historical Provider, MD  dicyclomine (BENTYL) 10 MG capsule Take 1 capsule (10 mg total) by mouth 3 (three) times daily before  meals. 03/06/14   Hart Carwin, MD  escitalopram (LEXAPRO) 20 MG tablet Take 20 mg by mouth daily.     Historical Provider, MD  estrogens, conjugated, (PREMARIN) 0.3 MG tablet Take 0.3 mg by mouth daily.     Historical Provider, MD  furosemide (LASIX) 20 MG tablet Take 20 mg by mouth as needed. 09/21/13   Historical Provider, MD  HYDROcodone-acetaminophen (NORCO/VICODIN) 5-325 MG per tablet Take 1 tablet by mouth every 6 (six) hours as needed. 09/19/13   Historical Provider, MD  levothyroxine (SYNTHROID, LEVOTHROID) 112 MCG tablet Take 112 mcg by mouth daily before breakfast.    Historical Provider, MD  losartan (COZAAR) 50 MG tablet Take 1 tablet (50 mg total) by mouth 2 (two) times daily. 10/13/13   [name], MD  PRADAXA 150 MG CAPS capsule TAKE ONE CAPSULE EVERY 12 HOURS 02/06/14   [name], MD  propafenone (RYTHMOL SR) 325 MG 12 hr capsule Take 1 capsule (325 mg total) by mouth 2 (two) times daily. 03/17/14   [name], MD  traZODone (DESYREL) 150 MG tablet Take 150 mg by mouth at bedtime.      Historical Provider, MD  zaleplon (SONATA) 10 MG capsule Take 10 mg by mouth at bedtime.     Historical Provider, MD   BP 96/62  Pulse 64  Temp(Src) 99.2 F (37.3 C) (Oral)  Resp 18  Ht 5\' 6"  (1.676 m)  Wt 150 lb 1.6 oz (68.085 kg)  BMI 24.24 kg/m2  SpO2 94% Physical Exam  Nursing note and vitals reviewed. Constitutional: She is oriented to person, place, and time. She appears well-developed and well-nourished. No distress.  HENT:  Head: Normocephalic.  Right Ear: Tympanic membrane, external ear and ear canal normal.  Left Ear: Tympanic membrane, external ear and ear canal normal.  Nose: Rhinorrhea present.  Mouth/Throat: Uvula is midline, oropharynx is clear and moist and mucous membranes are normal.  Postnasal drainage present  Eyes: Conjunctivae are normal.  Neck: Normal range of motion. Neck supple.  Cardiovascular: Normal rate, regular rhythm and normal heart sounds.    Pulmonary/Chest: Effort normal and breath sounds normal. No respiratory distress. She has no wheezes. She has no rales. She exhibits no tenderness.  Abdominal: Soft. Bowel sounds are normal. She exhibits no distension. There is no tenderness. There is no rebound.  Musculoskeletal: She exhibits no edema.  Neurological: She is alert and oriented to person, place, and time.  Skin: Skin is warm and dry.  Psychiatric: She has a normal mood and affect. Her behavior is normal.    ED Course  Procedures (including critical care time) Labs Review Labs Reviewed  CBC - Abnormal;  Notable for the following:    RBC 3.61 (*)    Hemoglobin 11.3 (*)    HCT 34.2 (*)    All other components within normal limits  BASIC METABOLIC PANEL - Abnormal; Notable for the following:    GFR calc non Af Amer 62 (*)    GFR calc Af Amer 72 (*)    All other components within normal limits  I-STAT TROPOININ, ED    Imaging Review Dg Chest 2 View  03/26/2014   CLINICAL DATA:  Chest pain, headache, productive cough which mucus for 4 days  EXAM: CHEST  2 VIEW  COMPARISON:  09/17/2013  FINDINGS: Cardiomediastinal silhouette is stable. No acute infiltrate or pleural effusion. No pulmonary edema. Dual lead cardiac pacemaker with leads in right atrium and right ventricle. Mild degenerative changes thoracic spine.  IMPRESSION: No active cardiopulmonary disease. Dual lead cardiac pacemaker in place.   Electronically Signed   By: Natasha Mead M.D.   On: 03/26/2014 17:09     EKG Interpretation   Date/Time:  Sunday March 26 2014 15:59:28 EDT Ventricular Rate:  63 PR Interval:  204 QRS Duration: 80 QT Interval:  428 QTC Calculation: 437 R Axis:   43 Text Interpretation:  Atrial-paced rhythm Low voltage QRS Septal infarct ,  age undetermined Abnormal ECG Confirmed by KOHUT  MD, STEPHEN ([id number]) on  03/26/2014 11:29:35 PM      MDM   Final diagnoses:  Bronchitis    patient with URI symptoms cough with thick green  productive sputum. Chest x-ray negative. Labs unremarkable. No chest pain unless she's coughing. Doubt coronary disease. Vital signs are normal doubt PE. Will treat with bronchodilators, Xopenex given history of A. fib, will try a short course of prednisone, and Zithromax. At this time stable for discharge home with close followup with her primary care Dr. Return precautions discussed. Patient was also discussed with Dr. Juleen China who agrees to plan.  Filed Vitals:   03/26/14 1600 03/26/14 1919 03/26/14 1923 03/26/14 1924  BP: 96/62 127/69  127/69  Pulse: 64  60 61  Temp: 99.2 F (37.3 C)   98 F (36.7 C)  TempSrc: Oral   Oral  Resp: 18  11 13   Height: 5\' 6"  (1.676 m)     Weight: 150 lb 1.6 oz (68.085 kg)     SpO2: 94%  96% 100%     Jymir Dunaj A Giulian Goldring, PA-C 03/27/14 0000

## 2014-03-26 NOTE — Discharge Instructions (Signed)
Doxycycline until all gone for infection. Prednisone for cough and inflammation. Tessalon as needed for cough. Inhaler 2 puffs every  4-6 hrs for cough. Follow up with your doctor.   Acute Bronchitis Bronchitis is inflammation of the airways that extend from the windpipe into the lungs (bronchi). The inflammation often causes mucus to develop. This leads to a cough, which is the most common symptom of bronchitis.  In acute bronchitis, the condition usually develops suddenly and goes away over time, usually in a couple weeks. Smoking, allergies, and asthma can make bronchitis worse. Repeated episodes of bronchitis may cause further lung problems.  CAUSES Acute bronchitis is most often caused by the same virus that causes a cold. The virus can spread from person to person (contagious) through coughing, sneezing, and touching contaminated objects. SIGNS AND SYMPTOMS   Cough.   Fever.   Coughing up mucus.   Body aches.   Chest congestion.   Chills.   Shortness of breath.   Sore throat.  DIAGNOSIS  Acute bronchitis is usually diagnosed through a physical exam. Your health care provider will also ask you questions about your medical history. Tests, such as chest X-rays, are sometimes done to rule out other conditions.  TREATMENT  Acute bronchitis usually goes away in a couple weeks. Oftentimes, no medical treatment is necessary. Medicines are sometimes given for relief of fever or cough. Antibiotic medicines are usually not needed but may be prescribed in certain situations. In some cases, an inhaler may be recommended to help reduce shortness of breath and control the cough. A cool mist vaporizer may also be used to help thin bronchial secretions and make it easier to clear the chest.  HOME CARE INSTRUCTIONS  Get plenty of rest.   Drink enough fluids to keep your urine clear or pale yellow (unless you have a medical condition that requires fluid restriction). Increasing fluids may  help thin your respiratory secretions (sputum) and reduce chest congestion, and it will prevent dehydration.   Take medicines only as directed by your health care provider.  If you were prescribed an antibiotic medicine, finish it all even if you start to feel better.  Avoid smoking and secondhand smoke. Exposure to cigarette smoke or irritating chemicals will make bronchitis worse. If you are a smoker, consider using nicotine gum or skin patches to help control withdrawal symptoms. Quitting smoking will help your lungs heal faster.   Reduce the chances of another bout of acute bronchitis by washing your hands frequently, avoiding people with cold symptoms, and trying not to touch your hands to your mouth, nose, or eyes.   Keep all follow-up visits as directed by your health care provider.  SEEK MEDICAL CARE IF: Your symptoms do not improve after 1 week of treatment.  SEEK IMMEDIATE MEDICAL CARE IF:  You develop an increased fever or chills.   You have chest pain.   You have severe shortness of breath.  You have bloody sputum.   You develop dehydration.  You faint or repeatedly feel like you are going to pass out.  You develop repeated vomiting.  You develop a severe headache. MAKE SURE YOU:   Understand these instructions.  Will watch your condition.  Will get help right away if you are not doing well or get worse. Document Released: 07/10/2004 Document Revised: 10/17/2013 Document Reviewed: 11/23/2012 [facility] Patient Information 2015 [place], [place]. This information is not intended to replace advice given to you by your health care provider. Make sure you discuss any  questions you have with your health care provider. ° °

## 2014-03-26 NOTE — ED Notes (Signed)
Pt states she started coughing Wednesday, had blood work last week. Pt has been sweaty and clammy but doesn't think she has been running a fever. Has a productive cough with green sputum.

## 2014-03-28 ENCOUNTER — Ambulatory Visit (INDEPENDENT_AMBULATORY_CARE_PROVIDER_SITE_OTHER): Payer: Medicare Other | Admitting: Cardiovascular Disease

## 2014-03-28 ENCOUNTER — Encounter: Payer: Self-pay | Admitting: Cardiovascular Disease

## 2014-03-28 VITALS — BP 178/96 | HR 61 | Resp 22 | Ht 66.0 in | Wt 153.0 lb

## 2014-03-28 DIAGNOSIS — I495 Sick sinus syndrome: Secondary | ICD-10-CM

## 2014-03-28 DIAGNOSIS — Z950 Presence of cardiac pacemaker: Secondary | ICD-10-CM

## 2014-03-28 DIAGNOSIS — Z7901 Long term (current) use of anticoagulants: Secondary | ICD-10-CM

## 2014-03-28 DIAGNOSIS — I480 Paroxysmal atrial fibrillation: Secondary | ICD-10-CM

## 2014-03-28 DIAGNOSIS — I10 Essential (primary) hypertension: Secondary | ICD-10-CM

## 2014-03-28 DIAGNOSIS — I4891 Unspecified atrial fibrillation: Secondary | ICD-10-CM

## 2014-03-28 NOTE — Progress Notes (Signed)
Patient ID: Jacqueline Kim, female   DOB: 10/20/1945, 68 y.o.   MRN: [id number]     Reason for office visit Pacemaker followup, paroxysmal mitral fibrillation  Jacqueline Kim returns for routine followup, but has been suffering from acute bronchitis for several days. She has been prescribed Xopenex inhalers but has been reluctant to use them secondary to concern about their arrhythmogenic potential. She does have wheezing, cough productive of scanty amounts of thick sputum. She does not have orthopnea or PND or lower extremity edema. She has not had fever or chills. Her chest x-ray as they did not show evidence of pneumonia or pulmonary edema.  She has not had palpitations since late April when she had a couple of long episodes of atrial fibrillation lasting almost 24 hours in aggregate. No arrhythmia has been detected since.  Interrogation of her pacemaker shows roughly 45% atrial paced rhythm and no ventricular pacing. In alignment of the severe sinus bradycardia around 36 beats per minute. Lead parameters all appear stable and heart rate histogram distribution is normal.  Her rhythm today is atrial paced ventricular sensed.  Allergies  Allergen Reactions  . Penicillins Anaphylaxis  . Azithromycin Other (See Comments)  . Epinephrine Other (See Comments)    Causes A-Fib  . Metoprolol     alopecia  . Lidocaine Palpitations    Or any similar medication    Current Outpatient Prescriptions  Medication Sig Dispense Refill  . atenolol (TENORMIN) 50 MG tablet Take 1 tablet (50 mg total) by mouth daily.  90 tablet  2  . benzonatate (TESSALON) 100 MG capsule Take 1 capsule (100 mg total) by mouth every 8 (eight) hours.  21 capsule  0  . Biotin 5000 MCG TABS Take 1 tablet by mouth daily.      . CELEBREX 200 MG capsule Take 200 mg by mouth as needed.       . conjugated estrogens (PREMARIN) vaginal cream Place 1.5 g vaginally daily as needed.       . dicyclomine (BENTYL) 10 MG capsule Take 1  capsule (10 mg total) by mouth 3 (three) times daily before meals.  90 capsule  2  . doxycycline (VIBRAMYCIN) 100 MG capsule Take 1 capsule (100 mg total) by mouth 2 (two) times daily.  14 capsule  0  . escitalopram (LEXAPRO) 20 MG tablet Take 20 mg by mouth daily.       Marland Kitchen estrogens, conjugated, (PREMARIN) 0.3 MG tablet Take 0.3 mg by mouth daily.       . furosemide (LASIX) 20 MG tablet Take 20 mg by mouth as needed.      Marland Kitchen HYDROcodone-acetaminophen (NORCO/VICODIN) 5-325 MG per tablet Take 1 tablet by mouth every 6 (six) hours as needed.      Marland Kitchen levothyroxine (SYNTHROID, LEVOTHROID) 112 MCG tablet Take 112 mcg by mouth daily before breakfast.      . losartan (COZAAR) 50 MG tablet Take 1 tablet (50 mg total) by mouth 2 (two) times daily.  180 tablet  3  . PRADAXA 150 MG CAPS capsule TAKE ONE CAPSULE EVERY 12 HOURS  180 capsule  1  . predniSONE (DELTASONE) 10 MG tablet Take 2 tablets (20 mg total) by mouth daily.  8 tablet  0  . propafenone (RYTHMOL SR) 325 MG 12 hr capsule Take 1 capsule (325 mg total) by mouth 2 (two) times daily.  180 capsule  2  . traZODone (DESYREL) 150 MG tablet Take 150 mg by mouth at bedtime.        Marland Kitchen  zaleplon (SONATA) 10 MG capsule Take 10 mg by mouth at bedtime.       . levalbuterol (XOPENEX HFA) 45 MCG/ACT inhaler Inhale 2 puffs into the lungs every 4 (four) hours as needed for wheezing.  1 Inhaler  12   No current facility-administered medications for this visit.    Past Medical History  Diagnosis Date  . Paroxysmal a-fib diagnosed 2008    a. On propafenone, Pradaxa.  . 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 Hypothyroid   . Anxiety   . Hypertension   . Glaucoma   . Complication of anesthesia     DIFFICULTY WAKING UP  . Arthritis   . Anticoagulant long-term use  WITH PRADAXA 05/27/2011  . DJD (degenerative joint disease)   . GERD (gastroesophageal reflux disease)   . IBS (irritable bowel syndrome)   . Depression   .  Diverticulosis   . External hemorrhoids   . Esophagitis 09/17/06  . History of stress test     a. 2008 - normal nuc.    Past Surgical History  Procedure Laterality Date  . Pacemaker insertion  04/08/11    MDT Revo implanted by Dr Rubie Maid  . Eye surgery Bilateral     cataracts  . Vaginal hysterectomy    . Liposuction    . Ovarian cyst removal    . Foot surgery    . Hand surgery      multiple  . Breast reduction surgery    . Facial cosmetic surgery    . Tonsillectomy    . Nm myocar perf wall motion  12/29/2006    No significant ischemia; EF 69%    Family History  Problem Relation Age of Onset  . Anesthesia problems Mother   . Colon cancer Neg Hx   . Colon polyps Mother     beign    History   Social History  . Marital Status: Legally Separated    Spouse Name: N/A    Number of Children: 3  . Years of Education: N/A   Occupational History  . retired    Social History Main Topics  . Smoking status: Former Games developer  . Smokeless tobacco: Never Used     Comment: Quit 15 years ago  . Alcohol Use: 3.6 oz/week    6 Glasses of wine per week     Comment: daily  . Drug Use: No  . Sexual Activity: Yes   Other Topics Concern  . Not on file   Social History Narrative   Lives in [place],  Former [employer],  Separated.      Review of systems: Cough, wheezing, scanty sputum production, mild dyspnea The patient specifically denies any chest pain at rest or with exertion, orthopnea, paroxysmal nocturnal dyspnea, syncope, palpitations, focal neurological deficits, intermittent claudication, lower extremity edema, unexplained weight gain, hemoptysis.  The patient also denies abdominal pain, nausea, vomiting, dysphagia, diarrhea, constipation, polyuria, polydipsia, dysuria, hematuria, frequency, urgency, abnormal bleeding or bruising, fever, chills, unexpected weight changes, mood swings, change in skin or hair texture, change in voice quality, auditory or visual  problems, allergic reactions or rashes, new musculoskeletal complaints other than usual "aches and pains".   PHYSICAL EXAM BP 178/96  Pulse 61  Resp 22  Ht 5\' 6"  (1.676 m)  Wt 69.4 kg (153 lb)  BMI 24.71 kg/m2 General: Alert, oriented x3, no distress  Head: no evidence of trauma, PERRL, EOMI, no exophtalmos or lid lag, no myxedema,  no xanthelasma; normal ears, nose and oropharynx  Neck: normal jugular venous pulsations and no hepatojugular reflux; brisk carotid pulses without delay and no carotid bruits  Chest: Scattered bilateral wheezing, no rales, no signs of consolidation by percussion or palpation, normal fremitus, symmetrical and full respiratory excursions, healthy left subclavian pacemaker site  Cardiovascular: normal position and quality of the apical impulse, regular rhythm, normal first and second heart sounds, no murmurs, rubs or gallops  Abdomen: no tenderness or distention, no masses by palpation, no abnormal pulsatility or arterial bruits, normal bowel sounds, no hepatosplenomegaly  Extremities: no clubbing, cyanosis or edema; 2+ radial, ulnar and brachial pulses bilaterally; 2+ right femoral, posterior tibial and dorsalis pedis pulses; 2+ left femoral, posterior tibial and dorsalis pedis pulses; no subclavian or femoral bruits  Neurological: grossly nonfocal   EKG: Atrial paced, ventricular sensed, QTC 448 ms, otherwise normal  Lipid Panel     Component Value Date/Time   CHOL  Value: 172        ATP III CLASSIFICATION:  <200     mg/dL   Desirable  601-093  mg/dL   Borderline High  >=235    mg/dL   High 10/21/3218 2542   TRIG 293* 12/19/2006 0315   HDL 37* 12/19/2006 0315   CHOLHDL 4.6 12/19/2006 0315   VLDL 59* 12/19/2006 0315   LDLCALC  Value: 76        Total Cholesterol/HDL:CHD Risk Coronary Heart Disease Risk Table                     Men   Women  1/2 Average Risk   3.4   3.3 12/19/2006 0315    BMET    Component Value Date/Time   NA 138 03/26/2014 1614   K 4.5 03/26/2014  1614   CL 99 03/26/2014 1614   CO2 24 03/26/2014 1614   GLUCOSE 84 03/26/2014 1614   BUN 16 03/26/2014 1614   CREATININE 0.91 03/26/2014 1614   CREATININE 1.05 02/23/2013 1550   CALCIUM 9.2 03/26/2014 1614   GFRNONAA 62* 03/26/2014 1614   GFRAA 72* 03/26/2014 1614     ASSESSMENT AND PLAN Normal dual chamber permanent pacemaker function. She does not like to do remote pacemaker monitoring and will return in 3 months for another pacemaker check Infrequent and well rate-controlled paroxysmal mitral fibrillation, no recurrence in the last 6 months or so. Acute bronchitis with bronchospasm. I told her that she can use the Xopenex and that this will have a lesser chance of causing arrhythmia compared to albuterol.  Patient Instructions  Your physician recommends that you schedule a follow-up appointment in: 3 months with Dr.Sevan Mcbroom      Aryanah Enslow  Thurmon Fair, MD, [facility] [phone] office [phone] pager

## 2014-03-28 NOTE — Patient Instructions (Signed)
Your physician recommends that you schedule a follow-up appointment in: 3 months with Dr. Croitoru.  

## 2014-03-31 LAB — MDC_IDC_ENUM_SESS_TYPE_INCLINIC
Battery Voltage: 3 V
Brady Statistic AP VP Percent: 0 %
Brady Statistic AP VS Percent: 43.72 %
Brady Statistic AS VS Percent: 56.28 %
Brady Statistic RA Percent Paced: 43.72 %
Brady Statistic RV Percent Paced: 0 %
Lead Channel Impedance Value: 424 Ohm
Lead Channel Impedance Value: 464 Ohm
Lead Channel Pacing Threshold Amplitude: 1 V
Lead Channel Pacing Threshold Amplitude: 1 V
Lead Channel Pacing Threshold Pulse Width: 0.4 ms
Lead Channel Sensing Intrinsic Amplitude: 3.956
Lead Channel Setting Pacing Amplitude: 2 V
Lead Channel Setting Pacing Amplitude: 2.5 V
Lead Channel Setting Pacing Pulse Width: 0.6 ms
MDC IDC MSMT LEADCHNL RV PACING THRESHOLD PULSEWIDTH: 0.6 ms
MDC IDC MSMT LEADCHNL RV SENSING INTR AMPL: 10.3763
MDC IDC SESS DTM: 20151013131105
MDC IDC SET LEADCHNL RV SENSING SENSITIVITY: 0.9 mV
MDC IDC SET ZONE DETECTION INTERVAL: 400 ms
MDC IDC STAT BRADY AS VP PERCENT: 0 %
Zone Setting Detection Interval: 350 ms

## 2014-04-02 NOTE — ED Provider Notes (Signed)
Medical screening examination/treatment/procedure(s) were performed by non-physician practitioner and as supervising physician I was immediately available for consultation/collaboration.   EKG Interpretation   Date/Time:  Sunday March 26 2014 15:59:28 EDT Ventricular Rate:  63 PR Interval:  204 QRS Duration: 80 QT Interval:  428 QTC Calculation: 437 R Axis:   43 Text Interpretation:  Atrial-paced rhythm Low voltage QRS Septal infarct ,  age undetermined Abnormal ECG Confirmed by Juleen China  MD, Sanjay Broadfoot ([id number]) on  03/26/2014 11:29:35 PM       Raeford Razor, MD 04/02/14 [phone]

## 2014-04-20 ENCOUNTER — Other Ambulatory Visit: Payer: Self-pay | Admitting: Cardiovascular Disease

## 2014-04-20 ENCOUNTER — Encounter: Payer: Self-pay | Admitting: Cardiovascular Disease

## 2014-05-25 ENCOUNTER — Other Ambulatory Visit: Payer: Self-pay | Admitting: Internal Medicine

## 2014-06-10 ENCOUNTER — Other Ambulatory Visit: Payer: Self-pay | Admitting: Cardiovascular Disease

## 2014-06-12 ENCOUNTER — Ambulatory Visit (INDEPENDENT_AMBULATORY_CARE_PROVIDER_SITE_OTHER): Payer: Medicare Other | Admitting: Cardiovascular Disease

## 2014-06-12 ENCOUNTER — Encounter: Payer: Self-pay | Admitting: Cardiovascular Disease

## 2014-06-12 VITALS — BP 90/62 | HR 65 | Ht 66.0 in | Wt 155.6 lb

## 2014-06-12 DIAGNOSIS — Z950 Presence of cardiac pacemaker: Secondary | ICD-10-CM

## 2014-06-12 DIAGNOSIS — I495 Sick sinus syndrome: Secondary | ICD-10-CM

## 2014-06-12 DIAGNOSIS — Z7901 Long term (current) use of anticoagulants: Secondary | ICD-10-CM

## 2014-06-12 DIAGNOSIS — E039 Hypothyroidism, unspecified: Secondary | ICD-10-CM

## 2014-06-12 DIAGNOSIS — K589 Irritable bowel syndrome without diarrhea: Secondary | ICD-10-CM

## 2014-06-12 DIAGNOSIS — I10 Essential (primary) hypertension: Secondary | ICD-10-CM

## 2014-06-12 DIAGNOSIS — I480 Paroxysmal atrial fibrillation: Secondary | ICD-10-CM

## 2014-06-12 DIAGNOSIS — I4891 Unspecified atrial fibrillation: Secondary | ICD-10-CM

## 2014-06-12 NOTE — Patient Instructions (Signed)
NO CHANGE IN MEDICATIONS  SEE DR Royann Shivers- IN FEB 2016  Your physician wants you to follow-up in 6 MONTH DR [name] You will receive a reminder letter in the mail two months in advance. If you don't receive a letter, please call our office to schedule the follow-up appointment.

## 2014-06-12 NOTE — Progress Notes (Signed)
Patient ID: [name], female   DOB: February 04, 1946, 68 y.o.   MRN: [id number]      HPI: TREYANA HARD is a 68 y.o. female  who presents for follow-up cardiologic evaluation.    Mrs. Hellums has known history of sinus node dysfunction. She has history of paroxysmal atrial fibrillation. She is status post permanent pacemaker implanted on 04/08/2011 Medtronic Revo MRI compatible pacemaker. She also has a history of hypothyroidism. She has had recurrent episodes of paroxysmal atrial fibrillation and has been on pradaxa anticoagulation for several years after initially having been on warfarin.  Most recently she's been maintained on rhythmol SR 325 mg twice a day. She also is on atenolol 50 mg daily. She had been on diltiazem but this was discontinued by Dr. Alanda Amass. She also is on losartan 50 mg for hypertension. He last saw Dr. Alanda Amass in August 2014 at which time she was doing well. She had been hospitalized in January 2014 with recurrent AF in the setting of significant increased family stress. She is now separated and living in a different household from her husband Dr. [name].  Ms Catallo states that she did feel her heart rate becomes irregular and fast on 03/12/2013. This lasted approximately 12 hours.  Her last episode of PAF on the Saturday before Easter when she was rushing with family members and trying to prepare dinner.  She was hospitalized and presented with a heart rate at 150.  She was started on IV Cardizem for rate control.  She spontaneously cardioverted to sinus rhythm on April 6 prior to a planned TEE cardioversion.  I saw her in followup of that hospitalization and suggested an EP evaluation.  She saw Dr. Johney Frame for discussion concerning atrial fibrillation ablation.  Apparently, at that time, the patient was feeling well.  She opted against pursuing AF ablation presently.  She presents now for followup evaluation.  She has not had any further episodes of AF since April.  She feels  that there is less stress in her life now.  She is living in [facility].  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  She is still working on her book and is hopeful that this is a final preparation prior to trying to get it published.  A followup echo Doppler study on 10/24/2013.  He showed an ejection fraction of 60-65% and there was evidence for grade 1 diastolic function without wall motion abnormalities.  Her left atrial dimension was normal.    Since I last saw her, she has seen Dr. Lieutenant Diego for pacemaker interrogation on 03/28/2014.  No further episodes of atrial fibrillation since April or demonstrated.  Interrogation of her pacemaker revealed a proximally 45%, atrially paced rhythm and no ventricular pacing.  Lead parameters were stable and she had a normal heart rate histogram distribution.  She denies any chest pain.  She is unaware of recent palpitations.  She does have irritable bowel syndrome.  She has a maintained on Rythmol SR 325 mg twice a day and is tolerating this well.  She denies edema, but takes Lasix if needed.  She has been on Synthroid 112 g for hypothyroidism.  She states her blood pressure has been controlled on losartan 50 g twice a day in addition to her atenolol 50 mg daily.   Past Medical History  Diagnosis Date  . Paroxysmal a-fib diagnosed 2008    a. On propafenone,  Pradaxa.  . 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 Hypothyroid   . Anxiety   . Hypertension   . Glaucoma   . Complication of anesthesia     DIFFICULTY WAKING UP  . Arthritis   . Anticoagulant long-term use  WITH PRADAXA 05/27/2011  . DJD (degenerative joint disease)   . GERD (gastroesophageal reflux disease)   . IBS (irritable bowel syndrome)   . Depression   . Diverticulosis   . External hemorrhoids   . Esophagitis 09/17/06  . History of stress test     a. 2008 - normal nuc.    Past  Surgical History  Procedure Laterality Date  . Pacemaker insertion  04/08/11    MDT Revo implanted by Dr Rubie Maid  . Eye surgery Bilateral     cataracts  . Vaginal hysterectomy    . Liposuction    . Ovarian cyst removal    . Foot surgery    . Hand surgery      multiple  . Breast reduction surgery    . Facial cosmetic surgery    . Tonsillectomy    . Nm myocar perf wall motion  12/29/2006    No significant ischemia; EF 69%    Allergies  Allergen Reactions  . Penicillins Anaphylaxis  . Azithromycin Other (See Comments)  . Epinephrine Other (See Comments)    Causes A-Fib  . Metoprolol     alopecia  . Lidocaine Palpitations    Or any similar medication    Current Outpatient Prescriptions  Medication Sig Dispense Refill  . atenolol (TENORMIN) 50 MG tablet Take 1 tablet (50 mg total) by mouth daily. 90 tablet 2  . atenolol (TENORMIN) 50 MG tablet TAKE ONE TABLET EACH DAY 90 tablet 2  . benzonatate (TESSALON) 100 MG capsule Take 1 capsule (100 mg total) by mouth every 8 (eight) hours. 21 capsule 0  . Biotin 5000 MCG TABS Take 1 tablet by mouth daily.    . CELEBREX 200 MG capsule Take 200 mg by mouth as needed.     . conjugated estrogens (PREMARIN) vaginal cream Place 1.5 g vaginally daily as needed.     . dicyclomine (BENTYL) 10 MG capsule TAKE ONE CAPSULE 3 TIMES DAILY BEFORE MEALS 90 capsule 2  . doxycycline (VIBRAMYCIN) 100 MG capsule Take 1 capsule (100 mg total) by mouth 2 (two) times daily. 14 capsule 0  . escitalopram (LEXAPRO) 20 MG tablet Take 20 mg by mouth daily.     Marland Kitchen estrogens, conjugated, (PREMARIN) 0.3 MG tablet Take 0.3 mg by mouth daily.     . furosemide (LASIX) 20 MG tablet Take 20 mg by mouth as needed.    Marland Kitchen HYDROcodone-acetaminophen (NORCO/VICODIN) 5-325 MG per tablet Take 1 tablet by mouth every 6 (six) hours as needed.    . levalbuterol (XOPENEX HFA) 45 MCG/ACT inhaler Inhale 2 puffs into the lungs every 4 (four) hours as needed for wheezing. 1 Inhaler 12  .  levothyroxine (SYNTHROID, LEVOTHROID) 112 MCG tablet Take 112 mcg by mouth daily before breakfast.    . losartan (COZAAR) 50 MG tablet Take 1 tablet (50 mg total) by mouth 2 (two) times daily. 180 tablet 3  . PRADAXA 150 MG CAPS capsule TAKE ONE CAPSULE EVERY 12 HOURS 180 capsule 1  . predniSONE (DELTASONE) 10 MG tablet Take 2 tablets (20 mg total) by mouth daily. 8 tablet 0  . propafenone (RYTHMOL SR) 325 MG 12 hr capsule Take 1 capsule (  325 mg total) by mouth 2 (two) times daily. 180 capsule 2  . traZODone (DESYREL) 150 MG tablet Take 150 mg by mouth at bedtime.      . zaleplon (SONATA) 10 MG capsule Take 10 mg by mouth at bedtime.      No current facility-administered medications for this visit.    History   Social History  . Marital Status: Legally Separated    Spouse Name: N/A    Number of Children: 3  . Years of Education: N/A   Occupational History  . retired    Social History Main Topics  . Smoking status: Former Games developer  . Smokeless tobacco: Never Used     Comment: Quit 15 years ago  . Alcohol Use: 3.6 oz/week    6 Glasses of wine per week     Comment: daily  . Drug Use: No  . Sexual Activity: Yes   Other Topics Concern  . Not on file   Social History Narrative   Lives in [place],  Former [employer],  Separated.      Family History  Problem Relation Age of Onset  . Anesthesia problems Mother   . Colon cancer Neg Hx   . Colon polyps Mother     beign   ROS General: Negative; No fevers, chills, or night sweats;  HEENT: Negative; No changes in vision or hearing, sinus congestion, difficulty swallowing Pulmonary: Negative; No cough, wheezing, shortness of breath, hemoptysis Cardiovascular: Negative; No chest pain, presyncope, syncope, palpatations; no awareness of recurrent AF GI: Positive for irritable bowel syndrome for which she takes Bentyl before meals;  GU: Negative; No dysuria, hematuria, or difficulty voiding Musculoskeletal: Negative; no  myalgias, joint pain, or weakness Hematologic/Oncology: Negative; no easy bruising, bleeding Endocrine: Negative; no heat/cold intolerance; no diabetes Neuro: Negative; no changes in balance, headaches Skin: Negative; No rashes or skin lesions Psychiatric: There is less stress and anxiety; No behavioral problems, depression Sleep: Negative; No snoring, daytime sleepiness, hypersomnolence, bruxism, restless legs, hypnogognic hallucinations, no cataplexy Other comprehensive 14 point system review is negative.   PE BP 90/62 mmHg  Pulse 65  Ht 5\' 6"  (1.676 m)  Wt 155 lb 9.6 oz (70.58 kg)  BMI 25.13 kg/m2  General: Alert, oriented, no distress.  Skin: normal turgor, no rashes HEENT: Normocephalic, atraumatic. Pupils round and reactive; sclera anicteric;no lid lag.  Nose without nasal septal hypertrophy Mouth/Parynx benign; Mallinpatti scale 2 Neck: No JVD, no carotid bruits with normal carotid upstroke. Lungs: clear to ausculatation and percussion; no wheezing or rales Chest wall: Nontender to palpation Heart: PMI is not displaced RRR, s1 s2 normal 1/6 systolic murmur; no S3 or S4 gallop.  No rubs, thrills or heaves. Abdomen: soft, nontender; no hepatosplenomehaly, BS+; abdominal aorta nontender and not dilated by palpation. Back: No CVA tenderness Pulses 2+ Extremities: no clubbing cyanosis or edema, Homan's sign negative  Neurologic: grossly nonfocal Psychologic: normal affect and mood.  ECG (independently read by me): Atrially paced rhythm at 65 with prolonged AV conduction with a PR interval at 270 ms.  No significant ST segment changes.   June 2015 ECG (independently read by me): Atrially paced rhythm at 62 beats per minute with prolonged AV conduction with a PR interval of 244 ms.  QTc interval 444 ms.  Prior 09/22/2013 ECG (independently read by me): Atrial paced rhythm at 63 beats per minute with ventricular sensing.   LABS:  BMET    Component Value Date/Time   NA 138  03/26/2014 1614  K 4.5 03/26/2014 1614   CL 99 03/26/2014 1614   CO2 24 03/26/2014 1614   GLUCOSE 84 03/26/2014 1614   BUN 16 03/26/2014 1614   CREATININE 0.91 03/26/2014 1614   CREATININE 1.05 02/23/2013 1550   CALCIUM 9.2 03/26/2014 1614   GFRNONAA 62* 03/26/2014 1614   GFRAA 72* 03/26/2014 1614     Hepatic Function Panel     Component Value Date/Time   PROT 6.3 02/03/2013 1549   ALBUMIN 3.9 02/03/2013 1549   AST 26 02/03/2013 1549   ALT 14 02/03/2013 1549   ALKPHOS 36* 02/03/2013 1549   BILITOT 0.3 02/03/2013 1549   BILIDIR <0.1 04/01/2011 1147   IBILI NOT CALCULATED 04/01/2011 1147     CBC    Component Value Date/Time   WBC 6.2 03/26/2014 1614   RBC 3.61* 03/26/2014 1614   HGB 11.3* 03/26/2014 1614   HCT 34.2* 03/26/2014 1614   PLT 245 03/26/2014 1614   MCV 94.7 03/26/2014 1614   MCH 31.3 03/26/2014 1614   MCHC 33.0 03/26/2014 1614   RDW 12.9 03/26/2014 1614   LYMPHSABS 1.1 02/03/2013 1549   MONOABS 0.5 02/03/2013 1549   EOSABS 0.2 02/03/2013 1549   BASOSABS 0.0 02/03/2013 1549     BNP    Component Value Date/Time   PROBNP 1110.0* 09/18/2013 0332    Lipid Panel     Component Value Date/Time   CHOL  12/19/2006 0315    172        ATP III CLASSIFICATION:  <200     mg/dL   Desirable  161-096  mg/dL   Borderline High  >=045    mg/dL   High   TRIG 409* 81/19/1478 0315   HDL 37* 12/19/2006 0315   CHOLHDL 4.6 12/19/2006 0315   VLDL 59* 12/19/2006 0315   LDLCALC  12/19/2006 0315    76        Total Cholesterol/HDL:CHD Risk Coronary Heart Disease Risk Table                     Men   Women  1/2 Average Risk   3.4   3.3     RADIOLOGY: No results found.    ASSESSMENT AND PLAN: Ms. Karenza Elbon has a history of PAF and status post permanent pacemaker for sinus node dysfunction. She is anticoagulated with her Pradaxa and has a Chads2vasc score of at least 3. Her echo Doppler study on 10/24/2013 showed normal systolic function with an  ejection fraction of 60-65%.  There was grade 1 diastolic dysfunction.  There were no regional wall motion abnormalities.  The left atrium was normal in size.  She has not had any recurrent episodes of atrial fibrillation since April when she was under increased stress around Easter.  She is tolerating her chronic anticoagulation with Pradaxa without bleeding.  She has a history of hypothyroidism but stable on Synthroid replacement at 112 g.  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  She is separated from her husband, Dr. Dione Booze and seems to have less stress now that she is by herself, not caring for multiple children.  She is tolerating per [place] known.  Her QTc interval is 465 ms.  Her blood pressure today is stable with losartan.  In addition to atenolol.  I again discussed that she can take an extra 25-50 mg of atenolol on a when necessary basis.  If recurrent palpitations were  to develop.  She does not want to have telephonic recording.  As result, will be seen Dr. Royann Shivers  forpacemaker reevaluation in February.  I will see her in 6 months for cardiology reevaluation.  Time spent: 25 minutes  [name], MD, [facility]  06/12/2014 2:28 PM

## 2014-06-14 DIAGNOSIS — K589 Irritable bowel syndrome without diarrhea: Secondary | ICD-10-CM | POA: Insufficient documentation

## 2014-07-03 ENCOUNTER — Other Ambulatory Visit (HOSPITAL_COMMUNITY): Payer: Self-pay | Admitting: Internal Medicine

## 2014-07-03 ENCOUNTER — Other Ambulatory Visit (HOSPITAL_COMMUNITY): Payer: Self-pay | Admitting: Unknown Physician Specialty

## 2014-07-03 ENCOUNTER — Other Ambulatory Visit: Payer: Self-pay | Admitting: Internal Medicine

## 2014-07-03 DIAGNOSIS — M25562 Pain in left knee: Secondary | ICD-10-CM

## 2014-07-03 DIAGNOSIS — M7989 Other specified soft tissue disorders: Secondary | ICD-10-CM

## 2014-07-04 ENCOUNTER — Ambulatory Visit (HOSPITAL_COMMUNITY): Payer: Medicare Other

## 2014-07-05 ENCOUNTER — Ambulatory Visit (HOSPITAL_COMMUNITY)
Admission: RE | Admit: 2014-07-05 | Discharge: 2014-07-05 | Disposition: A | Discharge status: Home / Self Care | Payer: Medicare Other | Source: Ambulatory Visit | Attending: Internal Medicine | Admitting: Internal Medicine

## 2014-07-05 DIAGNOSIS — M25562 Pain in left knee: Secondary | ICD-10-CM

## 2014-07-05 DIAGNOSIS — M7989 Other specified soft tissue disorders: Secondary | ICD-10-CM | POA: Insufficient documentation

## 2014-07-05 DIAGNOSIS — Z7901 Long term (current) use of anticoagulants: Secondary | ICD-10-CM

## 2014-07-05 NOTE — Progress Notes (Signed)
Left lower extremity venous duplex completed.  Left:  No evidence of DVT, superficial thrombosis, or Baker's cyst.  Right:  Negative for DVT in the common femoral vein.  

## 2014-07-31 ENCOUNTER — Encounter (HOSPITAL_COMMUNITY): Payer: Self-pay | Admitting: Emergency Medicine

## 2014-07-31 ENCOUNTER — Inpatient Hospital Stay (HOSPITAL_COMMUNITY)
Admission: EM | Admit: 2014-07-31 | Discharge: 2014-08-02 | DRG: 310 | Disposition: A | Discharge status: Home / Self Care | Payer: Medicare Other | Attending: Cardiology | Admitting: Cardiology

## 2014-07-31 DIAGNOSIS — R002 Palpitations: Secondary | ICD-10-CM | POA: Diagnosis not present

## 2014-07-31 DIAGNOSIS — Z87891 Personal history of nicotine dependence: Secondary | ICD-10-CM

## 2014-07-31 DIAGNOSIS — K589 Irritable bowel syndrome without diarrhea: Secondary | ICD-10-CM | POA: Diagnosis present

## 2014-07-31 DIAGNOSIS — I4891 Unspecified atrial fibrillation: Secondary | ICD-10-CM

## 2014-07-31 DIAGNOSIS — Z95810 Presence of automatic (implantable) cardiac defibrillator: Secondary | ICD-10-CM

## 2014-07-31 DIAGNOSIS — E039 Hypothyroidism, unspecified: Secondary | ICD-10-CM | POA: Diagnosis present

## 2014-07-31 DIAGNOSIS — K219 Gastro-esophageal reflux disease without esophagitis: Secondary | ICD-10-CM | POA: Diagnosis present

## 2014-07-31 DIAGNOSIS — Z7901 Long term (current) use of anticoagulants: Secondary | ICD-10-CM

## 2014-07-31 DIAGNOSIS — I495 Sick sinus syndrome: Secondary | ICD-10-CM | POA: Diagnosis present

## 2014-07-31 DIAGNOSIS — D649 Anemia, unspecified: Secondary | ICD-10-CM | POA: Diagnosis present

## 2014-07-31 DIAGNOSIS — Z791 Long term (current) use of non-steroidal anti-inflammatories (NSAID): Secondary | ICD-10-CM

## 2014-07-31 DIAGNOSIS — Z950 Presence of cardiac pacemaker: Secondary | ICD-10-CM | POA: Diagnosis present

## 2014-07-31 DIAGNOSIS — Z880 Allergy status to penicillin: Secondary | ICD-10-CM

## 2014-07-31 DIAGNOSIS — Z881 Allergy status to other antibiotic agents status: Secondary | ICD-10-CM

## 2014-07-31 DIAGNOSIS — Z79899 Other long term (current) drug therapy: Secondary | ICD-10-CM

## 2014-07-31 DIAGNOSIS — I480 Paroxysmal atrial fibrillation: Principal | ICD-10-CM | POA: Diagnosis present

## 2014-07-31 DIAGNOSIS — I10 Essential (primary) hypertension: Secondary | ICD-10-CM | POA: Diagnosis present

## 2014-07-31 DIAGNOSIS — Z888 Allergy status to other drugs, medicaments and biological substances status: Secondary | ICD-10-CM

## 2014-07-31 DIAGNOSIS — F419 Anxiety disorder, unspecified: Secondary | ICD-10-CM | POA: Diagnosis present

## 2014-07-31 LAB — CBC WITH DIFFERENTIAL/PLATELET
BASOS ABS: 0 10*3/uL (ref 0.0–0.1)
Basophils Relative: 0 % (ref 0–1)
EOS ABS: 0 10*3/uL (ref 0.0–0.7)
EOS PCT: 0 % (ref 0–5)
HEMATOCRIT: 31.5 % — AB (ref 36.0–46.0)
HEMOGLOBIN: 10.3 g/dL — AB (ref 12.0–15.0)
LYMPHS ABS: 0.8 10*3/uL (ref 0.7–4.0)
Lymphocytes Relative: 8 % — ABNORMAL LOW (ref 12–46)
MCH: 30.8 pg (ref 26.0–34.0)
MCHC: 32.7 g/dL (ref 30.0–36.0)
MCV: 94.3 fL (ref 78.0–100.0)
Monocytes Absolute: 0.9 10*3/uL (ref 0.1–1.0)
Monocytes Relative: 9 % (ref 3–12)
NEUTROS ABS: 8.4 10*3/uL — AB (ref 1.7–7.7)
Neutrophils Relative %: 83 % — ABNORMAL HIGH (ref 43–77)
Platelets: 297 10*3/uL (ref 150–400)
RBC: 3.34 MIL/uL — AB (ref 3.87–5.11)
RDW: 13.6 % (ref 11.5–15.5)
WBC: 10.1 10*3/uL (ref 4.0–10.5)

## 2014-07-31 LAB — I-STAT TROPONIN, ED: Troponin i, poc: 0.01 ng/mL (ref 0.00–0.08)

## 2014-07-31 MED ORDER — DILTIAZEM HCL 100 MG IV SOLR
5.0000 mg/h | Freq: Once | INTRAVENOUS | Status: AC
  Administered 2014-07-31: 5 mg/h via INTRAVENOUS

## 2014-07-31 MED ORDER — DILTIAZEM HCL 25 MG/5ML IV SOLN
10.0000 mg | Freq: Once | INTRAVENOUS | Status: AC
  Administered 2014-07-31: 10 mg via INTRAVENOUS
  Filled 2014-07-31: qty 5

## 2014-07-31 NOTE — ED Notes (Addendum)
Per EMS: pt reports a-fib starting tonight with flutter in her chest.  Pt has permanent pacemaker paced at 60, HR on arrival was 178 and dropped to 109 after 10mg  cardizem.  Pt also has left knee injury and takes hydrocodone for pain.  Pt reports sob on exertion, no cp.

## 2014-07-31 NOTE — ED Provider Notes (Signed)
CSN: [id number]     Arrival date & time 07/31/14  2249 History   First MD Initiated Contact with Patient 07/31/14 2256     Chief Complaint  Patient presents with  . Atrial Fibrillation     Patient is a 69 y.o. female presenting with atrial fibrillation. The history is provided by the patient.  Atrial Fibrillation This is a recurrent problem. The current episode started 1 to 2 hours ago. The problem occurs constantly. The problem has been gradually worsening. Associated symptoms include shortness of breath. Pertinent negatives include no chest pain and no abdominal pain. Nothing aggravates the symptoms. Nothing relieves the symptoms. Treatments tried: cardizem. The treatment provided no relief.  Patient presents from home for atrial fibrillation that started approximately 90 minutes ago She reports this occurred at home She has h/o atrial fibrillation and this is similar to prior episodes She reports palpitations and shortness of breath She denies CP She denies syncope She called ambulance for her atrial fibrillation She was given cardizem by EMS without any continued response   She also reports she takes vicodin everyday for knee pain.  She reports today she may "have taken too much" but not intentionally to harm herself.    Past Medical History  Diagnosis Date  . Paroxysmal a-fib diagnosed 2008    a. On propafenone, Pradaxa.  . 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 Hypothyroid   . Anxiety   . Hypertension   . Glaucoma   . Complication of anesthesia     DIFFICULTY WAKING UP  . Arthritis   . Anticoagulant long-term use  WITH PRADAXA 05/27/2011  . DJD (degenerative joint disease)   . GERD (gastroesophageal reflux disease)   . IBS (irritable bowel syndrome)   . Depression   . Diverticulosis   . External hemorrhoids   . Esophagitis 09/17/06  . History of stress test     a. 2008 - normal nuc.   Past Surgical History  Procedure  Laterality Date  . Pacemaker insertion  04/08/11    MDT Revo implanted by Dr Rubie Maid  . Eye surgery Bilateral     cataracts  . Vaginal hysterectomy    . Liposuction    . Ovarian cyst removal    . Foot surgery    . Hand surgery      multiple  . Breast reduction surgery    . Facial cosmetic surgery    . Tonsillectomy    . Nm myocar perf wall motion  12/29/2006    No significant ischemia; EF 69%   Family History  Problem Relation Age of Onset  . Anesthesia problems Mother   . Colon cancer Neg Hx   . Colon polyps Mother     beign   History  Substance Use Topics  . Smoking status: Former Games developer  . Smokeless tobacco: Never Used     Comment: Quit 15 years ago  . Alcohol Use: 3.6 oz/week    6 Glasses of wine per week     Comment: daily   OB History    No data available     Review of Systems  Constitutional: Negative for fever.  Respiratory: Positive for shortness of breath.   Cardiovascular: Negative for chest pain.  Gastrointestinal: Negative for abdominal pain.  Musculoskeletal: Positive for arthralgias. Negative for back pain.  Psychiatric/Behavioral: The patient is nervous/anxious.   All other systems reviewed and are negative.     Allergies  Penicillins; Azithromycin; Epinephrine;  Metoprolol; and Lidocaine  Home Medications   Prior to Admission medications   Medication Sig Start Date End Date Taking? Authorizing Provider  atenolol (TENORMIN) 50 MG tablet Take 1 tablet (50 mg total) by mouth daily. 07/29/13   Chrystie Nose, MD  benzonatate (TESSALON) 100 MG capsule Take 1 capsule (100 mg total) by mouth every 8 (eight) hours. 03/26/14   Tatyana A Kirichenko, PA-C  Biotin 5000 MCG TABS Take 1 tablet by mouth daily.    Historical Provider, MD  CELEBREX 200 MG capsule Take 200 mg by mouth as needed.  10/28/12   Historical Provider, MD  conjugated estrogens (PREMARIN) vaginal cream Place 1.5 g vaginally daily as needed.     Historical Provider, MD  escitalopram  (LEXAPRO) 20 MG tablet Take 20 mg by mouth daily.     Historical Provider, MD  estrogens, conjugated, (PREMARIN) 0.3 MG tablet Take 0.3 mg by mouth daily.     Historical Provider, MD  furosemide (LASIX) 20 MG tablet Take 20 mg by mouth as needed. 09/21/13   Historical Provider, MD  HYDROcodone-acetaminophen (NORCO/VICODIN) 5-325 MG per tablet Take 1 tablet by mouth every 6 (six) hours as needed. 09/19/13   Historical Provider, MD  levothyroxine (SYNTHROID, LEVOTHROID) 112 MCG tablet Take 112 mcg by mouth daily before breakfast.    Historical Provider, MD  losartan (COZAAR) 50 MG tablet Take 1 tablet (50 mg total) by mouth 2 (two) times daily. 10/13/13   [name], MD  predniSONE (DELTASONE) 10 MG tablet Take 2 tablets (20 mg total) by mouth daily. 03/26/14   Tatyana A Kirichenko, PA-C  propafenone (RYTHMOL SR) 325 MG 12 hr capsule Take 1 capsule (325 mg total) by mouth 2 (two) times daily. 03/17/14   [name], MD  traZODone (DESYREL) 150 MG tablet Take 150 mg by mouth at bedtime.      Historical Provider, MD  zaleplon (SONATA) 10 MG capsule Take 10 mg by mouth at bedtime.     Historical Provider, MD  atenolol (TENORMIN) 50 MG tablet TAKE ONE TABLET EACH DAY 04/20/14   [name], MD  dicyclomine (BENTYL) 10 MG capsule TAKE ONE CAPSULE 3 TIMES DAILY BEFORE MEALS 05/25/14   Hart Carwin, MD  levalbuterol [facility] HFA) 45 MCG/ACT inhaler Inhale 2 puffs into the lungs every 4 (four) hours as needed for wheezing. 03/26/14   Tatyana A Kirichenko, PA-C  PRADAXA 150 MG CAPS capsule TAKE ONE CAPSULE EVERY 12 HOURS 06/12/14   [name], MD   BP 132/83 mmHg  Pulse 128  Temp(Src) 98 F (36.7 C) (Oral)  Resp 14  Ht 5\' 5"  (1.651 m)  Wt 152 lb (68.947 kg)  BMI 25.29 kg/m2  SpO2 100% Physical Exam CONSTITUTIONAL: Well developed/well nourished HEAD: Normocephalic/atraumatic EYES: EOMI/PERRL ENMT: Mucous membranes moist NECK: supple no meningeal signs SPINE/BACK:entire spine nontender CV:  tachycardic, irregular Chest - pacemaker to left upper chest LUNGS: Lungs are clear to auscultation bilaterally, no apparent distress ABDOMEN: soft, nontender, no rebound or guarding, bowel sounds noted throughout abdomen GU:no cva tenderness NEURO: Pt is awake/alert/appropriate, moves all extremitiesx4.   EXTREMITIES: pulses normal/equal, full ROM, no LE edema is noted SKIN: warm, color normal PSYCH: she appears anxious  ED Course  Procedures  CRITICAL CARE Performed by: Total critical care time: 33 Critical care time was exclusive of separately billable procedures and treating other patients. Critical care was necessary to treat or prevent imminent or life-threatening deterioration. Critical care was time  spent personally by me on the following activities: development of treatment plan with patient and/or surrogate as well as nursing, discussions with consultants, evaluation of patient's response to treatment, examination of patient, obtaining history from patient or surrogate, ordering and performing treatments and interventions, ordering and review of laboratory studies, ordering and review of radiographic studies, pulse oximetry and re-evaluation of patient's condition. PATIENT WITH ATRIAL FIBRILLATION WITH RVR, HR>135, SHE REQUIRED IV CARDIZEM BOLUS AS WELL AS IV CARDIZEM DRIP.  CARDIOLOGY CONSULTED  Medications  diltiazem (CARDIZEM) 100 mg in dextrose 5 % 100 mL (1 mg/mL) infusion (10 mg/hr Intravenous Rate/Dose Change 08/01/14 0015)  diltiazem (CARDIZEM) injection 10 mg (0 mg Intravenous Stopped 07/31/14 2328)    Labs Review Labs Reviewed  COMPREHENSIVE METABOLIC PANEL - Abnormal; Notable for the following:    Glucose, Bld 114 (*)    BUN 29 (*)    Total Protein 5.9 (*)    Albumin 3.3 (*)    GFR calc non Af Amer 55 (*)    GFR calc Af Amer 64 (*)    All other components within normal limits  CBC WITH DIFFERENTIAL/PLATELET - Abnormal; Notable for the following:     RBC 3.34 (*)    Hemoglobin 10.3 (*)    HCT 31.5 (*)    Neutrophils Relative % 83 (*)    Neutro Abs 8.4 (*)    Lymphocytes Relative 8 (*)    All other components within normal limits  ACETAMINOPHEN LEVEL - Abnormal; Notable for the following:    Acetaminophen (Tylenol), Serum <10.0 (*)    All other components within normal limits  I-STAT TROPOININ, ED      EKG Interpretation   Date/Time:  Monday July 31 2014 22:59:52 EST Ventricular Rate:  116 PR Interval:    QRS Duration: 81 QT Interval:  320 QTC Calculation: 444 R Axis:   10 Text Interpretation:  Atrial fibrillation Non-specific ST-t changes pt now  in atrial fibrillation when compared to prior Confirmed by [facility]  MD,  Natalyah Cummiskey ([id number]) on 07/31/2014 11:13:04 PM     11:24 PM Pt presents with atrial fibrillation with RVR with HR >130 She did not respond to cardizem bolus by EMS, will give another 10mg  cardizem bolus and start cardizem drip She also reports taking vicodin and reports "losing track" of how much she took but did not intentionally harm herself.  Last usage was over 3 hrs ago.  APAP/LFTs ordered. 12:17 AM PT STILL TACHYCARDIC I HAVE ASKED TO ESCALATE CARDIZEM DRIP TO 10MG /HR 12:24 AM D/w cardiology on call to evaluate patient BP 139/90 mmHg  Pulse 126  Temp(Src) 98 F (36.7 C) (Oral)  Resp 24  Ht 5\' 5"  (1.651 m)  Wt 152 lb (68.947 kg)  BMI 25.29 kg/m2  SpO2 98%  MDM   Final diagnoses:  Atrial fibrillation with rapid ventricular response    Nursing notes including past medical history and social history reviewed and considered in documentation Labs/vital reviewed myself and considered during evaluation Previous records reviewed and considered     , MD 08/01/14 [phone]

## 2014-07-31 NOTE — ED Notes (Signed)
pt reports a-fib starting tonight with flutter in her chest.  Pt has permanent pacemaker paced at 60, HR on arrival was 178 and dropped to 109 after 10mg  cardizem.  Pt also has left knee injury and takes hydrocodone for pain.  Pt reports sob on exertion, no cp.

## 2014-08-01 ENCOUNTER — Observation Stay (HOSPITAL_COMMUNITY): Payer: Medicare Other

## 2014-08-01 DIAGNOSIS — Z87891 Personal history of nicotine dependence: Secondary | ICD-10-CM | POA: Diagnosis not present

## 2014-08-01 DIAGNOSIS — K589 Irritable bowel syndrome without diarrhea: Secondary | ICD-10-CM | POA: Diagnosis present

## 2014-08-01 DIAGNOSIS — R002 Palpitations: Secondary | ICD-10-CM | POA: Diagnosis present

## 2014-08-01 DIAGNOSIS — Z95810 Presence of automatic (implantable) cardiac defibrillator: Secondary | ICD-10-CM | POA: Diagnosis not present

## 2014-08-01 DIAGNOSIS — Z880 Allergy status to penicillin: Secondary | ICD-10-CM | POA: Diagnosis not present

## 2014-08-01 DIAGNOSIS — F419 Anxiety disorder, unspecified: Secondary | ICD-10-CM | POA: Diagnosis present

## 2014-08-01 DIAGNOSIS — Z791 Long term (current) use of non-steroidal anti-inflammatories (NSAID): Secondary | ICD-10-CM | POA: Diagnosis not present

## 2014-08-01 DIAGNOSIS — I4891 Unspecified atrial fibrillation: Secondary | ICD-10-CM

## 2014-08-01 DIAGNOSIS — I495 Sick sinus syndrome: Secondary | ICD-10-CM

## 2014-08-01 DIAGNOSIS — Z7901 Long term (current) use of anticoagulants: Secondary | ICD-10-CM | POA: Diagnosis not present

## 2014-08-01 DIAGNOSIS — Z79899 Other long term (current) drug therapy: Secondary | ICD-10-CM | POA: Diagnosis not present

## 2014-08-01 DIAGNOSIS — D649 Anemia, unspecified: Secondary | ICD-10-CM | POA: Diagnosis present

## 2014-08-01 DIAGNOSIS — Z881 Allergy status to other antibiotic agents status: Secondary | ICD-10-CM | POA: Diagnosis not present

## 2014-08-01 DIAGNOSIS — I480 Paroxysmal atrial fibrillation: Secondary | ICD-10-CM | POA: Diagnosis present

## 2014-08-01 DIAGNOSIS — I10 Essential (primary) hypertension: Secondary | ICD-10-CM | POA: Diagnosis present

## 2014-08-01 DIAGNOSIS — K219 Gastro-esophageal reflux disease without esophagitis: Secondary | ICD-10-CM | POA: Diagnosis present

## 2014-08-01 DIAGNOSIS — Z888 Allergy status to other drugs, medicaments and biological substances status: Secondary | ICD-10-CM | POA: Diagnosis not present

## 2014-08-01 DIAGNOSIS — E039 Hypothyroidism, unspecified: Secondary | ICD-10-CM | POA: Diagnosis present

## 2014-08-01 LAB — TSH: TSH: 6.378 u[IU]/mL — ABNORMAL HIGH (ref 0.350–4.500)

## 2014-08-01 LAB — COMPREHENSIVE METABOLIC PANEL
ALT: 11 U/L (ref 0–35)
AST: 16 U/L (ref 0–37)
Albumin: 3.3 g/dL — ABNORMAL LOW (ref 3.5–5.2)
Alkaline Phosphatase: 39 U/L (ref 39–117)
Anion gap: 5 (ref 5–15)
BILIRUBIN TOTAL: 0.4 mg/dL (ref 0.3–1.2)
BUN: 29 mg/dL — ABNORMAL HIGH (ref 6–23)
CHLORIDE: 108 mmol/L (ref 96–112)
CO2: 24 mmol/L (ref 19–32)
CREATININE: 1 mg/dL (ref 0.50–1.10)
Calcium: 8.9 mg/dL (ref 8.4–10.5)
GFR calc Af Amer: 64 mL/min — ABNORMAL LOW (ref >90)
GFR, EST NON AFRICAN AMERICAN: 55 mL/min — AB (ref >90)
GLUCOSE: 114 mg/dL — AB (ref 70–99)
Potassium: 4.1 mmol/L (ref 3.5–5.1)
SODIUM: 137 mmol/L (ref 135–145)
Total Protein: 5.9 g/dL — ABNORMAL LOW (ref 6.0–8.3)

## 2014-08-01 LAB — ACETAMINOPHEN LEVEL: Acetaminophen (Tylenol), Serum: <10 ug/mL — ABNORMAL LOW (ref 10–30)

## 2014-08-01 LAB — CBC
HCT: 31.8 % — ABNORMAL LOW (ref 36.0–46.0)
Hemoglobin: 10.3 g/dL — ABNORMAL LOW (ref 12.0–15.0)
MCH: 30.8 pg (ref 26.0–34.0)
MCHC: 32.4 g/dL (ref 30.0–36.0)
MCV: 95.2 fL (ref 78.0–100.0)
Platelets: 316 10*3/uL (ref 150–400)
RBC: 3.34 MIL/uL — AB (ref 3.87–5.11)
RDW: 13.5 % (ref 11.5–15.5)
WBC: 9.6 10*3/uL (ref 4.0–10.5)

## 2014-08-01 LAB — T4, FREE: FREE T4: 0.9 ng/dL (ref 0.80–1.80)

## 2014-08-01 LAB — TROPONIN I

## 2014-08-01 LAB — FERRITIN: FERRITIN: 32 ng/mL (ref 10–291)

## 2014-08-01 MED ORDER — SODIUM CHLORIDE 0.9 % IV SOLN: 250.0000 mL | INTRAVENOUS | Status: DC

## 2014-08-01 MED ORDER — ESCITALOPRAM OXALATE 10 MG PO TABS
20.0000 mg | ORAL_TABLET | Freq: Every day | ORAL | Status: DC
  Administered 2014-08-01 – 2014-08-02 (×2): 20 mg via ORAL
  Filled 2014-08-01 (×2): qty 2

## 2014-08-01 MED ORDER — SODIUM CHLORIDE 0.9 % IJ SOLN
3.0000 mL | Freq: Two times a day (BID) | INTRAMUSCULAR | Status: DC
Start: 2014-08-01 — End: 2014-08-02

## 2014-08-01 MED ORDER — PROPAFENONE HCL ER 325 MG PO CP12
325.0000 mg | ORAL_CAPSULE | Freq: Two times a day (BID) | ORAL | Status: DC
  Filled 2014-08-01: qty 1

## 2014-08-01 MED ORDER — DABIGATRAN ETEXILATE MESYLATE 150 MG PO CAPS
150.0000 mg | ORAL_CAPSULE | Freq: Two times a day (BID) | ORAL | Status: DC
  Administered 2014-08-01 – 2014-08-02 (×4): 150 mg via ORAL
  Filled 2014-08-01 (×6): qty 1

## 2014-08-01 MED ORDER — SODIUM CHLORIDE 0.9 % IJ SOLN: 3.0000 mL | INTRAMUSCULAR | Status: DC | PRN

## 2014-08-01 MED ORDER — ESTROGENS CONJUGATED 0.3 MG PO TABS
0.3000 mg | ORAL_TABLET | Freq: Every day | ORAL | Status: DC
  Administered 2014-08-01 – 2014-08-02 (×2): 0.3 mg via ORAL
  Filled 2014-08-01 (×2): qty 1

## 2014-08-01 MED ORDER — SODIUM CHLORIDE 0.9 % IJ SOLN: 3.0000 mL | Freq: Two times a day (BID) | INTRAMUSCULAR | Status: DC

## 2014-08-01 MED ORDER — ATENOLOL 25 MG PO TABS
50.0000 mg | ORAL_TABLET | Freq: Every day | ORAL | Status: DC
  Administered 2014-08-01 – 2014-08-02 (×2): 50 mg via ORAL
  Filled 2014-08-01 (×2): qty 2

## 2014-08-01 MED ORDER — DILTIAZEM HCL 100 MG IV SOLR: 5.0000 mg/h | Freq: Once | INTRAVENOUS | Status: DC

## 2014-08-01 MED ORDER — PROPAFENONE HCL ER 325 MG PO CP12
325.0000 mg | ORAL_CAPSULE | Freq: Two times a day (BID) | ORAL | Status: AC
  Administered 2014-08-01: 325 mg via ORAL
  Filled 2014-08-01: qty 1

## 2014-08-01 MED ORDER — PROPAFENONE HCL ER 225 MG PO CP12
225.0000 mg | ORAL_CAPSULE | Freq: Two times a day (BID) | ORAL | Status: AC
  Administered 2014-08-01: 225 mg via ORAL
  Filled 2014-08-01: qty 1

## 2014-08-01 MED ORDER — ZOLPIDEM TARTRATE 5 MG PO TABS
5.0000 mg | ORAL_TABLET | Freq: Every evening | ORAL | Status: DC | PRN
  Administered 2014-08-01: 5 mg via ORAL
  Filled 2014-08-01: qty 1

## 2014-08-01 MED ORDER — SODIUM CHLORIDE 0.9 % IV SOLN
250.0000 mL | INTRAVENOUS | Status: DC | PRN
  Administered 2014-08-02: 10:00:00 via INTRAVENOUS

## 2014-08-01 MED ORDER — HYDROCODONE-ACETAMINOPHEN 5-325 MG PO TABS
1.0000 | ORAL_TABLET | Freq: Four times a day (QID) | ORAL | Status: DC | PRN
  Administered 2014-08-01 (×3): 1 via ORAL
  Filled 2014-08-01 (×3): qty 1

## 2014-08-01 MED ORDER — TRAZODONE HCL 100 MG PO TABS
150.0000 mg | ORAL_TABLET | Freq: Every evening | ORAL | Status: DC | PRN
  Administered 2014-08-01: 150 mg via ORAL
  Filled 2014-08-01 (×2): qty 1

## 2014-08-01 MED ORDER — PROPAFENONE HCL ER 325 MG PO CP12
325.0000 mg | ORAL_CAPSULE | Freq: Two times a day (BID) | ORAL | Status: DC
  Administered 2014-08-01: 325 mg via ORAL
  Filled 2014-08-01 (×4): qty 1

## 2014-08-01 MED ORDER — PROPAFENONE HCL ER 325 MG PO CP12
325.0000 mg | ORAL_CAPSULE | Freq: Two times a day (BID) | ORAL | Status: DC
  Administered 2014-08-02: 325 mg via ORAL
  Filled 2014-08-01 (×2): qty 1

## 2014-08-01 MED ORDER — DICYCLOMINE HCL 10 MG PO CAPS
10.0000 mg | ORAL_CAPSULE | Freq: Three times a day (TID) | ORAL | Status: DC | PRN
  Administered 2014-08-01: 10 mg via ORAL
  Filled 2014-08-01: qty 1

## 2014-08-01 MED ORDER — OFF THE BEAT BOOK
Freq: Once | Status: DC
  Filled 2014-08-01: qty 1

## 2014-08-01 MED ORDER — LEVOTHYROXINE SODIUM 112 MCG PO TABS
112.0000 ug | ORAL_TABLET | Freq: Every day | ORAL | Status: DC
  Administered 2014-08-01 – 2014-08-02 (×2): 112 ug via ORAL
  Filled 2014-08-01 (×2): qty 1

## 2014-08-01 MED ORDER — DILTIAZEM HCL 100 MG IV SOLR
5.0000 mg/h | INTRAVENOUS | Status: DC
  Administered 2014-08-01 – 2014-08-02 (×2): 10 mg/h via INTRAVENOUS

## 2014-08-01 NOTE — ED Notes (Signed)
Cardizem drip increased to 15 per cardiology, unable to document rate/dose change due to others in chart.

## 2014-08-01 NOTE — H&P (Signed)
History and Physical  Patient ID: Jacqueline Kim MRN: [id number], SOB: December 28, 1945 69 y.o. Date of Encounter: 08/01/2014, 1:17 AM  Primary Physician: Thayer Headings, MD Primary Cardiologist: Dr. Nicholaus Bloom  Chief Complaint: palpitations  HPI: 69 y.o. female w/ PMHx significant for paroxysmal afib on pradaxa and propafenone, HTN, anxiety who presented to [facility] on 08/01/2014 with complaints of palpitations in her neck which is known to her as being back in atrial fibrillation. She has had similar sensation before and recognizes that at 8:30 pm tonight, she reverted to afib. Recently has been healthy without problems except for her left knee in which has had worsening arthritis resulting in her taking more vicodin. Otherwise, has been active without chest pain or CHF symptoms.    Previously, she has waited before presented to the ER in hopes of converting on her own. However, she reports this time her heart rate felt more "erratic" than before prompting her to come to the ER soon rather than later. Mild SOB but no chest pain, syncope or pre-syncope.    Has not missed any medications. No new medications. No fevers, chills, illness, bleeding.  EKG revealed afib with RVR, no ST or TW abnormalities. Labs are significant for mild anemia. Normal troponin. Cxray not done; now ordered.  In the ER, was given dilt 10 iv x 1 with improvement of rates from the 140s to 110s and now on dilt gtt at 10 mg/hr, rates in the 110s to 120s. Rare v pacing on telemetry.   Past Medical History  Diagnosis Date  . Paroxysmal a-fib diagnosed 2008    a. On propafenone, Pradaxa.  . 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 Hypothyroid   . Anxiety   . Hypertension   . Glaucoma   . Complication of anesthesia     DIFFICULTY WAKING UP  . Arthritis   . Anticoagulant long-term use  WITH PRADAXA 05/27/2011  . DJD (degenerative joint disease)   . GERD (gastroesophageal reflux  disease)   . IBS (irritable bowel syndrome)   . Depression   . Diverticulosis   . External hemorrhoids   . Esophagitis 09/17/06  . History of stress test     a. 2008 - normal nuc.     Surgical History:  Past Surgical History  Procedure Laterality Date  . Pacemaker insertion  04/08/11    MDT Revo implanted by Dr Rubie Maid  . Eye surgery Bilateral     cataracts  . Vaginal hysterectomy    . Liposuction    . Ovarian cyst removal    . Foot surgery    . Hand surgery      multiple  . Breast reduction surgery    . Facial cosmetic surgery    . Tonsillectomy    . Nm myocar perf wall motion  12/29/2006    No significant ischemia; EF 69%     Home Meds: Prior to Admission medications   Medication Sig Start Date End Date Taking? Authorizing Provider  atenolol (TENORMIN) 50 MG tablet Take 1 tablet (50 mg total) by mouth daily. 07/29/13  Yes Chrystie Nose, MD  atenolol (TENORMIN) 50 MG tablet TAKE ONE TABLET EACH DAY Patient not taking: Reported on 08/01/2014 04/20/14   [name], MD  benzonatate (TESSALON) 100 MG capsule Take 1 capsule (100 mg total) by mouth every 8 (eight) hours. Patient not taking: Reported on 08/01/2014 03/26/14   Tatyana A Kirichenko, PA-C  Biotin 5000 MCG TABS  Take 1 tablet by mouth daily.   Yes Historical Provider, MD  CELEBREX 200 MG capsule Take 200 mg by mouth 2 (two) times daily as needed for mild pain.  10/28/12  Yes Historical Provider, MD  conjugated estrogens (PREMARIN) vaginal cream Place 1.5 g vaginally daily as needed.    Yes Historical Provider, MD  dicyclomine (BENTYL) 10 MG capsule TAKE ONE CAPSULE 3 TIMES DAILY BEFORE MEALS 05/25/14  Yes Hart Carwin, MD  escitalopram (LEXAPRO) 20 MG tablet Take 20 mg by mouth daily.    Yes Historical Provider, MD  estrogens, conjugated, (PREMARIN) 0.3 MG tablet Take 0.3 mg by mouth daily.    Yes Historical Provider, MD  furosemide (LASIX) 20 MG tablet Take 20 mg by mouth daily as needed for fluid.  09/21/13  Yes  Historical Provider, MD  HYDROcodone-acetaminophen (NORCO/VICODIN) 5-325 MG per tablet Take 1 tablet by mouth every 6 (six) hours as needed for moderate pain.  09/19/13  Yes Historical Provider, MD  levalbuterol [facility] HFA) 45 MCG/ACT inhaler Inhale 2 puffs into the lungs every 4 (four) hours as needed for wheezing. 03/26/14  Yes Tatyana A Kirichenko, PA-C  levothyroxine (SYNTHROID, LEVOTHROID) 112 MCG tablet Take 112 mcg by mouth daily before breakfast.   Yes Historical Provider, MD  losartan (COZAAR) 50 MG tablet Take 1 tablet (50 mg total) by mouth 2 (two) times daily. 10/13/13  Yes [name], MD  PRADAXA 150 MG CAPS capsule TAKE ONE CAPSULE EVERY 12 HOURS 06/12/14  Yes [name], MD  predniSONE (DELTASONE) 10 MG tablet Take 2 tablets (20 mg total) by mouth daily. Patient not taking: Reported on 08/01/2014 03/26/14   Tatyana A Kirichenko, PA-C  propafenone (RYTHMOL SR) 325 MG 12 hr capsule Take 1 capsule (325 mg total) by mouth 2 (two) times daily. 03/17/14  Yes [name], MD  traZODone (DESYREL) 150 MG tablet Take 150 mg by mouth at bedtime.     Yes Historical Provider, MD  zaleplon (SONATA) 10 MG capsule Take 10 mg by mouth at bedtime.    Yes Historical Provider, MD    Allergies:  Allergies  Allergen Reactions  . Penicillins Anaphylaxis  . Azithromycin Other (See Comments)  . Epinephrine Other (See Comments)    Causes A-Fib  . Metoprolol     alopecia  . Lidocaine Palpitations    Or any similar medication    History   Social History  . Marital Status: Legally Separated    Spouse Name: N/A  . Number of Children: 3  . Years of Education: N/A   Occupational History  . retired    Social History Main Topics  . Smoking status: Former Games developer  . Smokeless tobacco: Never Used     Comment: Quit 15 years ago  . Alcohol Use: 3.6 oz/week    6 Glasses of wine per week     Comment: daily  . Drug Use: No  . Sexual Activity: Yes   Other Topics Concern  . Not on file    Social History Narrative   Lives in [place],  Former [employer],  Separated.       Family History  Problem Relation Age of Onset  . Anesthesia problems Mother   . Colon cancer Neg Hx   . Colon polyps Mother     beign    Review of Systems: General: negative for chills, fever, night sweats or weight changes.  Cardiovascular: see HPI Dermatological: negative for rash Respiratory: negative for cough  or wheezing Urologic: negative for hematuria Abdominal: negative for nausea, vomiting, diarrhea, bright red blood per rectum, melena, or hematemesis Neurologic: negative for visual changes, syncope, or dizziness All other systems reviewed and are otherwise negative except as noted above.  Labs:   Lab Results  Component Value Date   WBC 10.1 07/31/2014   HGB 10.3* 07/31/2014   HCT 31.5* 07/31/2014   MCV 94.3 07/31/2014   PLT 297 07/31/2014    Recent Labs Lab 07/31/14 2315  NA 137  K 4.1  CL 108  CO2 24  BUN 29*  CREATININE 1.00  CALCIUM 8.9  PROT 5.9*  BILITOT 0.4  ALKPHOS 39  ALT 11  AST 16  GLUCOSE 114*   No results for input(s): CKTOTAL, CKMB, TROPONINI in the last 72 hours.  Radiology/Studies:  No results found.   EKG: see HPI.  Physical Exam: Blood pressure 151/99, pulse 139, temperature 98 F (36.7 C), temperature source Oral, resp. rate 15, height 5\' 5"  (1.651 m), weight 68.947 kg (152 lb), SpO2 97 %. General: Well developed, well nourished, in no acute distress. Head: Normocephalic, atraumatic, sclera non-icteric, nares are without discharge Neck: Supple. Negative for carotid bruits. JVD not elevated. Lungs: Clear bilaterally to auscultation without wheezes, rales, or rhonchi. Breathing is unlabored. Heart: irreg and tachy Abdomen: Soft, non-tender, non-distended with normoactive bowel sounds. No rebound/guarding. No obvious abdominal masses. Msk:  Strength and tone appear normal for age. Extremities: No edema. No clubbing or cyanosis.  Distal pedal pulses are 2+ and equal bilaterally. Neuro: Alert and oriented X 3. Moves all extremities spontaneously. Psych:  Responds to questions appropriately with a normal affect.   Problem List 1. Atrial fibrillation with RVR 2. Chronic anticoagulation, on propafenone 3. HTN 4. Anemia, normocytic 5. HTN 6. Anxiety, arthritis, IBS  ASSESSMENT AND PLAN:  69 y.o. female w/ PMHx significant for paroxysmal afib on pradaxa and propafenone, HTN, anxiety who presented to [facility] on 08/01/2014 with complaints of palpitations in her neck which is known to her as being back in atrial fibrillation.  Rates improving on dilt gtt and prior to transporting to floor bed, increased dilt gtt to 15 for goal rates < 100-110 bpm. Continue atenolol and propafenone and perhaps she will convert on her own. Otherwise, will keep on clear liquids for the possibility of DCCV (compliant with pradaxa anticoagulation) if she does not self convert. Has done reasonably well on propafenone with last hospitalization for afib back in April per pt. Has previously discussed ablation and understands that afib is progressive but is not yet interested in more aggressive treatments of afib.  Noted to be trending over the last year to more anemic, now with normocytic anemia with Hct of 31%. No evidence of bleeding. Will check ferritin but also need to check if up to date with colon cancer screening. On celebrex.  Continue home meds for HTN except for losartan to provide room for dilt gtt.  Continue home meds for pain and IBS and menopause.  Fully anticoagulated. Clear liquid diet\ Full code.  Signed, Sigourney Portillo C. MD 08/01/2014, 1:17 AM

## 2014-08-01 NOTE — Progress Notes (Addendum)
       Patient Name: Jacqueline Kim Date of Encounter: 08/01/2014    SUBJECTIVE:Still in AF this AM but asymptomatic as rate better controlled.   TELEMETRY:  A fib with average rate around seventy and intermittent pacing noted. Filed Vitals:   08/01/14 0130 08/01/14 0231 08/01/14 0506 08/01/14 0803  BP: 119/69 139/92 145/76 152/79  Pulse: 138 136 82 93  Temp:  98.4 F (36.9 C) 98 F (36.7 C) 98.9 F (37.2 C)  TempSrc:  Oral Oral Oral  Resp: 22 22 20 20   Height:  5\' 5"  (1.651 m)    Weight:  157 lb 11.2 oz (71.532 kg) 157 lb 11.2 oz (71.532 kg)   SpO2: 96% 97% 98% 97%    Intake/Output Summary (Last 24 hours) at 08/01/14 0950 Last data filed at 08/01/14 08/03/14  Gross per 24 hour  Intake      0 ml  Output    850 ml  Net   -850 ml   LABS: Basic Metabolic Panel:  Recent Labs  08/03/14 2315  NA 137  K 4.1  CL 108  CO2 24  GLUCOSE 114*  BUN 29*  CREATININE 1.00  CALCIUM 8.9   CBC:  Recent Labs  07/31/14 2315 08/01/14 0339  WBC 10.1 9.6  NEUTROABS 8.4*  --   HGB 10.3* 10.3*  HCT 31.5* 31.8*  MCV 94.3 95.2  PLT 297 316   Cardiac Enzymes:  Recent Labs  08/01/14 0339  TROPONINI <0.03   Radiology/Studies:  CXR is okay  Physical Exam: Blood pressure 152/79, pulse 93, temperature 98.9 F (37.2 C), temperature source Oral, resp. rate 20, height 5\' 5"  (1.651 m), weight 157 lb 11.2 oz (71.532 kg), SpO2 97 %. Weight change:   Wt Readings from Last 3 Encounters:  08/01/14 157 lb 11.2 oz (71.532 kg)  06/12/14 155 lb 9.6 oz (70.58 kg)  03/28/14 153 lb (69.4 kg)   Chest is clear Cardiac exam irregularly irregular.. No murmur or gallop.  ASSESSMENT:  1. 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 2. PAF with RVR now controlled on IV diltiazem. CHADS-VASC score is 3. 3. Anticoagulated with Pradaxa continuously.  Plan:  1. Prefers conservative therapy and not cardioversion if she can avoid. "I have always gone back into rhythm on my  own." 2. May require Cardioversion in AM if no spontaneous conversion 3. Verify Propafenone was given last night and this AM(she says around 4 AM) 4. Will give slightly more Propafenone today Signed, 08/03/14 08/01/2014, 9:50 AM

## 2014-08-01 NOTE — Progress Notes (Signed)
UR completed 

## 2014-08-01 NOTE — Progress Notes (Signed)
DCCV scheduled for 02/17 at 10:00 for Dr Mayford Knife.

## 2014-08-02 ENCOUNTER — Inpatient Hospital Stay (HOSPITAL_COMMUNITY): Payer: Medicare Other | Admitting: Anesthesiology

## 2014-08-02 ENCOUNTER — Encounter (HOSPITAL_COMMUNITY): Payer: Self-pay | Admitting: Anesthesiology

## 2014-08-02 ENCOUNTER — Encounter (HOSPITAL_COMMUNITY)
Admission: EM | Disposition: A | Discharge status: Home / Self Care | Payer: Self-pay | Source: Home / Self Care | Attending: Cardiology

## 2014-08-02 DIAGNOSIS — Z7901 Long term (current) use of anticoagulants: Secondary | ICD-10-CM

## 2014-08-02 DIAGNOSIS — I4891 Unspecified atrial fibrillation: Secondary | ICD-10-CM

## 2014-08-02 HISTORY — PX: CARDIOVERSION: SHX1299

## 2014-08-02 SURGERY — CARDIOVERSION
Anesthesia: Monitor Anesthesia Care

## 2014-08-02 MED ORDER — PROPOFOL 10 MG/ML IV BOLUS
INTRAVENOUS | Status: DC | PRN
  Administered 2014-08-02: 50 mg via INTRAVENOUS

## 2014-08-02 MED ORDER — LIDOCAINE HCL (CARDIAC) 20 MG/ML IV SOLN
INTRAVENOUS | Status: DC | PRN
  Administered 2014-08-02: 70 mg via INTRAVENOUS

## 2014-08-02 MED ORDER — LOSARTAN POTASSIUM 50 MG PO TABS: 50.0000 mg | ORAL_TABLET | Freq: Two times a day (BID) | ORAL | Status: DC

## 2014-08-02 NOTE — Progress Notes (Signed)
Pt is ready for DC home accompanied by family. Pt reports she understands all DC instructions, follow up appointments, and medications.   Torie Sidney Kann, RN  

## 2014-08-02 NOTE — Anesthesia Preprocedure Evaluation (Addendum)
Anesthesia Evaluation  Patient identified by MRN, date of birth, ID band Patient awake    Reviewed: Allergy & Precautions, NPO status , Patient's Chart, lab work & pertinent test results  History of Anesthesia Complications Negative for: history of anesthetic complications  Airway Mallampati: II  TM Distance: >3 FB Neck ROM: Full    Dental  (+) Teeth Intact, Dental Advidsory Given,    Pulmonary shortness of breath, neg sleep apnea, neg COPDneg recent URI, former smoker,  breath sounds clear to auscultation        Cardiovascular hypertension, Pt. on medications and Pt. on home beta blockers + dysrhythmias Atrial Fibrillation + pacemaker Rhythm:Irregular     Neuro/Psych PSYCHIATRIC DISORDERS Anxiety Depression negative neurological ROS     GI/Hepatic Neg liver ROS, GERD-  Controlled,  Endo/Other  Hypothyroidism   Renal/GU negative Renal ROS     Musculoskeletal  (+) Arthritis -,   Abdominal   Peds  Hematology  (+) anemia ,   Anesthesia Other Findings   Reproductive/Obstetrics                           Anesthesia Physical Anesthesia Plan  ASA: III  Anesthesia Plan: MAC   Post-op Pain Management:    Induction: Intravenous  Airway Management Planned: Natural Airway and Mask  Additional Equipment: None  Intra-op Plan:   Post-operative Plan:   Informed Consent: I have reviewed the patients History and Physical, chart, labs and discussed the procedure including the risks, benefits and alternatives for the proposed anesthesia with the patient or authorized representative who has indicated his/her understanding and acceptance.   Dental advisory given and Dental Advisory Given  Plan Discussed with: Surgeon, CRNA and Anesthesiologist  Anesthesia Plan Comments:       Anesthesia Quick Evaluation

## 2014-08-02 NOTE — Interval H&P Note (Signed)
History and Physical Interval Note:  08/02/2014 10:03 AM  Jacqueline Kim  has presented today for surgery, with the diagnosis of a fib  The various methods of treatment have been discussed with the patient and family. After consideration of risks, benefits and other options for treatment, the patient has consented to  Procedure(s): CARDIOVERSION (N/A) as a surgical intervention .  The patient's history has been reviewed, patient examined, no change in status, stable for surgery.  I have reviewed the patient's chart and labs.  Questions were answered to the patient's satisfaction.     Kymoni Lesperance R

## 2014-08-02 NOTE — Discharge Instructions (Signed)
Heart Healthy diet  Continue current meds.  Follow up as instructed.   Atrial Fibrillation Atrial fibrillation is a type of irregular heart rhythm (arrhythmia). During atrial fibrillation, the upper chambers of the heart (atria) quiver continuously in a chaotic pattern. This causes an irregular and often rapid heart rate.  Atrial fibrillation is the result of the heart becoming overloaded with disorganized signals that tell it to beat. These signals are normally released one at a time by a part of the right atrium called the sinoatrial node. They then travel from the atria to the lower chambers of the heart (ventricles), causing the atria and ventricles to contract and pump blood as they pass. In atrial fibrillation, parts of the atria outside of the sinoatrial node also release these signals. This results in two problems. First, the atria receive so many signals that they do not have time to fully contract. Second, the ventricles, which can only receive one signal at a time, beat irregularly and out of rhythm with the atria.  There are three types of atrial fibrillation:   Paroxysmal. Paroxysmal atrial fibrillation starts suddenly and stops on its own within a week.  Persistent. Persistent atrial fibrillation lasts for more than a week. It may stop on its own or with treatment.  Permanent. Permanent atrial fibrillation does not go away. Episodes of atrial fibrillation may lead to permanent atrial fibrillation. Atrial fibrillation can prevent your heart from pumping blood normally. It increases your risk of stroke and can lead to heart failure.  CAUSES   Heart conditions, including a heart attack, heart failure, coronary artery disease, and heart valve conditions.   Inflammation of the sac that surrounds the heart (pericarditis).  Blockage of an artery in the lungs (pulmonary embolism).  Pneumonia or other infections.  Chronic lung disease.  Thyroid problems, especially if the thyroid is  overactive (hyperthyroidism).  Caffeine, excessive alcohol use, and use of some illegal drugs.   Use of some medicines, including certain decongestants and diet pills.  Heart surgery.   Birth defects.  Sometimes, no cause can be found. When this happens, the atrial fibrillation is called lone atrial fibrillation. The risk of complications from atrial fibrillation increases if you have lone atrial fibrillation and you are age 32 years or older. RISK FACTORS  Heart failure.  Coronary artery disease.  Diabetes mellitus.   High blood pressure (hypertension).   Obesity.   Other arrhythmias.   Increased age. SIGNS AND SYMPTOMS   A feeling that your heart is beating rapidly or irregularly.   A feeling of discomfort or pain in your chest.   Shortness of breath.   Sudden light-headedness or weakness.   Getting tired easily when exercising.   Urinating more often than normal (mainly when atrial fibrillation first begins).  In paroxysmal atrial fibrillation, symptoms may start and suddenly stop. DIAGNOSIS  Your health care provider may be able to detect atrial fibrillation when taking your pulse. Your health care provider may have you take a test called an ambulatory electrocardiogram (ECG). An ECG records your heartbeat patterns over a 24-hour period. You may also have other tests, such as:  Transthoracic echocardiogram (TTE). During echocardiography, sound waves are used to evaluate how blood flows through your heart.  Transesophageal echocardiogram (TEE).  Stress test. There is more than one type of stress test. If a stress test is needed, ask your health care provider about which type is best for you.  Chest X-ray exam.  Blood tests.  Computed tomography (CT).  TREATMENT  Treatment may include:  Treating any underlying conditions. For example, if you have an overactive thyroid, treating the condition may correct atrial fibrillation.  Taking medicine.  Medicines may be given to control a rapid heart rate or to prevent blood clots, heart failure, or a stroke.  Having a procedure to correct the rhythm of the heart:  Electrical cardioversion. During electrical cardioversion, a controlled, low-energy shock is delivered to the heart through your skin. If you have chest pain, very low blood pressure, or sudden heart failure, this procedure may need to be done as an emergency.  Catheter ablation. During this procedure, heart tissues that send the signals that cause atrial fibrillation are destroyed.  Surgical ablation. During this surgery, thin lines of heart tissue that carry the abnormal signals are destroyed. This procedure can either be an open-heart surgery or a minimally invasive surgery. With the minimally invasive surgery, small cuts are made to access the heart instead of a large opening.  Pulmonary venous isolation. During this surgery, tissue around the veins that carry blood from the lungs (pulmonary veins) is destroyed. This tissue is thought to carry the abnormal signals. HOME CARE INSTRUCTIONS   Take medicines only as directed by your health care provider. Some medicines can make atrial fibrillation worse or recur.  If blood thinners were prescribed by your health care provider, take them exactly as directed. Too much blood-thinning medicine can cause bleeding. If you take too little, you will not have the needed protection against stroke and other problems.  Perform blood tests at home if directed by your health care provider. Perform blood tests exactly as directed.  Quit smoking if you smoke.  Do not drink alcohol.  Do not drink caffeinated beverages such as coffee, soda, and some teas. You may drink decaffeinated coffee, soda, or tea.   Maintain a healthy weight.Do not use diet pills unless your health care provider approves. They may make heart problems worse.   Follow diet instructions as directed by your health care  provider.  Exercise regularly as directed by your health care provider.  Keep all follow-up visits as directed by your health care provider. This is important. PREVENTION  The following substances can cause atrial fibrillation to recur:   Caffeinated beverages.  Alcohol.  Certain medicines, especially those used for breathing problems.  Certain herbs and herbal medicines, such as those containing ephedra or ginseng.  Illegal drugs, such as cocaine and amphetamines. Sometimes medicines are given to prevent atrial fibrillation from recurring. Proper treatment of any underlying condition is also important in helping prevent recurrence.  SEEK MEDICAL CARE IF:  You notice a change in the rate, rhythm, or strength of your heartbeat.  You suddenly begin urinating more frequently.  You tire more easily when exerting yourself or exercising. SEEK IMMEDIATE MEDICAL CARE IF:   You have chest pain, abdominal pain, sweating, or weakness.  You feel nauseous.  You have shortness of breath.  You suddenly have swollen feet and ankles.  You feel dizzy.  Your face or limbs feel numb or weak.  You have a change in your vision or speech. MAKE SURE YOU:   Understand these instructions.  Will watch your condition.  Will get help right away if you are not doing well or get worse. Document Released: 06/02/2005 Document Revised: 10/17/2013 Document Reviewed: 07/13/2012 [facility] Patient Information 2015 [place], [place]. This information is not intended to replace advice given to you by your health care provider. Make sure you discuss any  questions you have with your health care provider.  Electrical Cardioversion Electrical cardioversion is the delivery of a jolt of electricity to change the rhythm of the heart. Sticky patches or metal paddles are placed on the chest to deliver the electricity from a device. This is done to restore a normal rhythm. A rhythm that is too fast or not regular  keeps the heart from pumping well. Electrical cardioversion is done in an emergency if:   There is low or no blood pressure as a result of the heart rhythm.   Normal rhythm must be restored as fast as possible to protect the brain and heart from further damage.   It may save a life. Cardioversion may be done for heart rhythms that are not immediately life threatening, such as atrial fibrillation or flutter, in which:   The heart is beating too fast or is not regular.   Medicine to change the rhythm has not worked.   It is safe to wait in order to allow time for preparation.  Symptoms of the abnormal rhythm are bothersome.  The risk of stroke and other serious problems can be reduced. LET [facility] CARE PROVIDER KNOW ABOUT:   Any allergies you have.  All medicines you are taking, including vitamins, herbs, eye drops, creams, and over-the-counter medicines.  Previous problems you or members of your family have had with the use of anesthetics.   Any blood disorders you have.   Previous surgeries you have had.   Medical conditions you have. RISKS AND COMPLICATIONS  Generally, this is a safe procedure. However, problems can occur and include:   Breathing problems related to the anesthetic used.  A blood clot that breaks free and travels to other parts of your body. This could cause a stroke or other problems. The risk of this is lowered by use of blood-thinning medicine (anticoagulant) prior to the procedure.  Cardiac arrest (rare). BEFORE THE PROCEDURE   You may have tests to detect blood clots in your heart and to evaluate heart function.  You may start taking anticoagulants so your blood does not clot as easily.   Medicines may be given to help stabilize your heart rate and rhythm. PROCEDURE  You will be given medicine through an IV tube to reduce discomfort and make you sleepy (sedative).   An electrical shock will be delivered. AFTER THE PROCEDURE Your  heart rhythm will be watched to make sure it does not change.  Document Released: 05/23/2002 Document Revised: 10/17/2013 Document Reviewed: 12/15/2012 [facility] Patient Information 2015 [place], [place]. This information is not intended to replace advice given to you by your health care provider. Make sure you discuss any questions you have with your health care provider.

## 2014-08-02 NOTE — H&P (View-Only) (Signed)
       Patient Name: Jacqueline Kim Date of Encounter: 08/01/2014    SUBJECTIVE:Still in AF this AM but asymptomatic as rate better controlled.   TELEMETRY:  A fib with average rate around seventy and intermittent pacing noted. Filed Vitals:   08/01/14 0130 08/01/14 0231 08/01/14 0506 08/01/14 0803  BP: 119/69 139/92 145/76 152/79  Pulse: 138 136 82 93  Temp:  98.4 F (36.9 C) 98 F (36.7 C) 98.9 F (37.2 C)  TempSrc:  Oral Oral Oral  Resp: 22 22 20 20  Height:  5' 5" (1.651 m)    Weight:  157 lb 11.2 oz (71.532 kg) 157 lb 11.2 oz (71.532 kg)   SpO2: 96% 97% 98% 97%    Intake/Output Summary (Last 24 hours) at 08/01/14 0950 Last data filed at 08/01/14 0938  Gross per 24 hour  Intake      0 ml  Output    850 ml  Net   -850 ml   LABS: Basic Metabolic Panel:  Recent Labs  07/31/14 2315  NA 137  K 4.1  CL 108  CO2 24  GLUCOSE 114*  BUN 29*  CREATININE 1.00  CALCIUM 8.9   CBC:  Recent Labs  07/31/14 2315 08/01/14 0339  WBC 10.1 9.6  NEUTROABS 8.4*  --   HGB 10.3* 10.3*  HCT 31.5* 31.8*  MCV 94.3 95.2  PLT 297 316   Cardiac Enzymes:  Recent Labs  08/01/14 0339  TROPONINI <0.03   Radiology/Studies:  CXR is okay  Physical Exam: Blood pressure 152/79, pulse 93, temperature 98.9 F (37.2 C), temperature source Oral, resp. rate 20, height 5' 5" (1.651 m), weight 157 lb 11.2 oz (71.532 kg), SpO2 97 %. Weight change:   Wt Readings from Last 3 Encounters:  08/01/14 157 lb 11.2 oz (71.532 kg)  06/12/14 155 lb 9.6 oz (70.58 kg)  03/28/14 153 lb (69.4 kg)   Chest is clear Cardiac exam irregularly irregular.. No murmur or gallop.  ASSESSMENT:  1. 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 2. PAF with RVR now controlled on IV diltiazem. CHADS-VASC score is 3. 3. Anticoagulated with Pradaxa continuously.  Plan:  1. Prefers conservative therapy and not cardioversion if she can avoid. "I have always gone back into rhythm on my  own." 2. May require Cardioversion in AM if no spontaneous conversion 3. Verify Propafenone was given last night and this AM(she says around 4 AM) 4. Will give slightly more Propafenone today Signed, Toben Acuna III,Rodricus Candelaria W 08/01/2014, 9:50 AM 

## 2014-08-02 NOTE — Discharge Summary (Signed)
Physician Discharge Summary       Patient ID: Jacqueline Kim MRN: [id number] DOB/AGE: [birth year] 69 y.o.  Admit date: 07/31/2014 Discharge date: 08/02/2014 Primary Cardiologist:Dr. 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 Lenise Arena Course: 69 y.o. female w/ PMHx significant for paroxysmal afib on pradaxa and propafenone, HTN, anxiety who presented to [facility] on 08/01/2014 with complaints of palpitations in her neck which is known to her as being back in atrial fibrillation. She has had similar sensation before and recognizes that at 8:30 pm tonight, she reverted to afib. Recently has been healthy without problems except for her left knee in which has had worsening arthritis resulting in her taking more vicodin. Otherwise, has been active without chest pain or CHF symptoms.  Previously, she has waited before presented to the ER in hopes of converting on her own. However, she reports this time her heart rate felt more "erratic" than before prompting her to come to the ER soon rather than later. Mild SOB but no chest pain, syncope or pre-syncope.  Has not missed any medications. No new medications. No fevers, chills, illness, bleeding.  She was admitted and did not convert on her own, she was made NPO and underwent DCCV on the 17th with 150 watts to SR.    At discharge back in her room without complaints, dilt drip d/c'd.  She is seen by Dr. Katrinka Blazing and plan for discharge.  She has appt with Dr. Judie Petit. Croitoru for pacer check 08/08/14 and we will have her keep appt.     Consults: cardiology  Significant Diagnostic Studies:  BMP Latest Ref Rng 07/31/2014 03/26/2014 09/17/2013  Glucose 70 - 99  mg/dL 270(W) 84 85  BUN 6 - 23 mg/dL 23(J) 16 21  Creatinine 0.50 - 1.10 mg/dL 6.28 3.15 1.76  Sodium 135 - 145 mmol/L 137 138 139  Potassium 3.5 - 5.1 mmol/L 4.1 4.5 4.1  Chloride 96 - 112 mmol/L 108 99 98  CO2 19 - 32 mmol/L 24 24 22   Calcium 8.4 - 10.5 mg/dL 8.9 9.2 9.6   CBC    Component Value Date/Time   WBC 9.6 08/01/2014 0339   RBC 3.34* 08/01/2014 0339   HGB 10.3* 08/01/2014 0339   HCT 31.8* 08/01/2014 0339   PLT 316 08/01/2014 0339   MCV 95.2 08/01/2014 0339   MCH 30.8 08/01/2014 0339   MCHC 32.4 08/01/2014 0339   RDW 13.5 08/01/2014 0339   LYMPHSABS 0.8 07/31/2014 2315   MONOABS 0.9 07/31/2014 2315   EOSABS 0.0 07/31/2014 2315   BASOSABS 0.0 07/31/2014 2315     Troponin <0.03 Ferritin 32 TSH 6.378  Free T4 0.90  CHEST 2 VIEW COMPARISON: 03/26/2014 FINDINGS: There are intact appearances of the transvenous leads. Heart size is unchanged, normal. Hilar, mediastinal and cardiac contours are unremarkable and unchanged. The lungs are clear except for minor interstitial coarsening. There are no airspace opacities. There are no pleural effusions. IMPRESSION: No acute findings  Discharge Exam: Blood pressure 145/70, pulse 61, temperature 97.9 F (36.6 C), temperature source Oral, resp. rate 17, height 5\' 5"  (1.651 m), weight 152 lb 3.2 oz (69.037 kg), SpO2 96 %.  Per Dr. 05/26/2014 General:Pleasant affect, NAD Skin:Warm and  dry, brisk capillary refill, no burn HEENT:normocephalic, sclera clear, mucus membranes moist Neck:supple, no JVD, no bruits  Heart:S1S2 RRR without murmur, gallup, rub or click Lungs:clear without rales, rhonchi, or wheezes SHF:WYOV, non tender, + BS, do not palpate liver spleen or masses Ext:no lower ext edema, 2+ pedal pulses, 2+ radial pulses Neuro:alert and oriented, MAE, follows commands, + facial symmetry Tele: maintaining SR   Disposition: 01-Home or Self Care     Medication List    STOP taking these medications         benzonatate 100 MG capsule  Commonly known as:  TESSALON     levalbuterol 45 MCG/ACT inhaler  Commonly known as:  XOPENEX HFA     predniSONE 10 MG tablet  Commonly known as:  DELTASONE      TAKE these medications        atenolol 50 MG tablet  Commonly known as:  TENORMIN  Take 1 tablet (50 mg total) by mouth daily.     Biotin 5000 MCG Tabs  Take 1 tablet by mouth daily.     CELEBREX 200 MG capsule  Generic drug:  celecoxib  Take 200 mg by mouth 2 (two) times daily as needed for mild pain.     conjugated estrogens vaginal cream  Commonly known as:  PREMARIN  Place 1.5 g vaginally daily as needed.     dicyclomine 10 MG capsule  Commonly known as:  BENTYL  TAKE ONE CAPSULE 3 TIMES DAILY BEFORE MEALS     escitalopram 20 MG tablet  Commonly known as:  LEXAPRO  Take 20 mg by mouth daily.     estrogens (conjugated) 0.3 MG tablet  Commonly known as:  PREMARIN  Take 0.3 mg by mouth daily.     furosemide 20 MG tablet  Commonly known as:  LASIX  Take 20 mg by mouth daily as needed for fluid.     HYDROcodone-acetaminophen 5-325 MG per tablet  Commonly known as:  NORCO/VICODIN  Take 1 tablet by mouth every 6 (six) hours as needed for moderate pain.     levothyroxine 112 MCG tablet  Commonly known as:  SYNTHROID, LEVOTHROID  Take 112 mcg by mouth daily before breakfast.     losartan 50 MG tablet  Commonly known as:  COZAAR  Take 1 tablet (50 mg total) by mouth 2 (two) times daily.     PRADAXA 150 MG Caps capsule  Generic drug:  dabigatran  TAKE ONE CAPSULE EVERY 12 HOURS     propafenone 325 MG 12 hr capsule  Commonly known as:  RYTHMOL SR  Take 1 capsule (325 mg total) by mouth 2 (two) times daily.     traZODone 150 MG tablet  Commonly known as:  DESYREL  Take 150 mg by mouth at bedtime.     zaleplon 10 MG capsule  Commonly known as:  SONATA  Take 10 mg by mouth at bedtime.       Follow-up Information    Follow up with Thurmon Fair, MD On 08/08/2014.    Specialty:  Cardiology   Why:  keep appointment with Dr. Salena Saner.  at 8:45 AM   Contact information:   [address] [place] [address] [phone]        Discharge Instructions: Heart Healthy diet  Continue current meds.  Follow up as instructed.   Signed: Leone Brand Nurse Practitioner-Certified [facility]: [facility]ART[facility] 08/02/2014, 11:46 AM  Time spent on discharge : >30 minutes.

## 2014-08-02 NOTE — Transfer of Care (Signed)
Immediate Anesthesia Transfer of Care Note  Patient: Jacqueline Kim  Procedure(s) Performed: Procedure(s): CARDIOVERSION (N/A)  Patient Location: Endoscopy Unit  Anesthesia Type:MAC  Level of Consciousness: awake, alert  and oriented  Airway & Oxygen Therapy: Patient Spontanous Breathing and Patient connected to nasal cannula oxygen  Post-op Assessment: Report given to RN, Post -op Vital signs reviewed and stable and Patient moving all extremities X 4  Post vital signs: Reviewed and stable  Last Vitals:  Filed Vitals:   08/02/14 0914  BP: 141/81  Pulse: 110  Temp: 36.6 C  Resp: 18    Complications: No apparent anesthesia complications

## 2014-08-02 NOTE — Anesthesia Postprocedure Evaluation (Signed)
  Anesthesia Post-op Note  Patient: Jacqueline Kim  Procedure(s) Performed: Procedure(s): CARDIOVERSION (N/A)  Patient Location: Endoscopy Unit  Anesthesia Type:MAC  Level of Consciousness: awake, alert  and oriented  Airway and Oxygen Therapy: Patient Spontanous Breathing and Patient connected to nasal cannula oxygen  Post-op Pain: none  Post-op Assessment: Post-op Vital signs reviewed, Patient's Cardiovascular Status Stable, Respiratory Function Stable, Patent Airway, No signs of Nausea or vomiting and Pain level controlled  Post-op Vital Signs: Reviewed and stable  Last Vitals:  Filed Vitals:   08/02/14 0914  BP: 141/81  Pulse: 110  Temp: 36.6 C  Resp: 18    Complications: No apparent anesthesia complications

## 2014-08-02 NOTE — CV Procedure (Signed)
   Electrical Cardioversion Procedure Note ABIOLA BEHRING [id number] September 02, 1945  Procedure: Electrical Cardioversion Indications:  Atrial Fibrillation  Time Out: Verified patient identification, verified procedure,medications/allergies/relevent history reviewed, required imaging and test results available.  Performed  Procedure Details  The patient was NPO after midnight. Anesthesia was administered at the beside  by Dr.Moser with 50mg  of propofol and 70mg  of Lidocaine.  Cardioversion was done with synchronized biphasic defibrillation with AP pads with 150watts.  The patient converted to normal sinus rhythm. The patient tolerated the procedure well   IMPRESSION:  Successful cardioversion of atrial fibrillation to AV paced rhythm.    TURNER,TRACI R 08/02/2014, 10:04 AM

## 2014-08-03 ENCOUNTER — Encounter (HOSPITAL_COMMUNITY): Payer: Self-pay | Admitting: Cardiology

## 2014-08-08 ENCOUNTER — Encounter: Payer: Self-pay | Admitting: Cardiovascular Disease

## 2014-08-08 ENCOUNTER — Ambulatory Visit (INDEPENDENT_AMBULATORY_CARE_PROVIDER_SITE_OTHER): Payer: Medicare Other | Admitting: Cardiovascular Disease

## 2014-08-08 VITALS — BP 116/78 | HR 66 | Resp 16 | Ht 65.0 in | Wt 153.2 lb

## 2014-08-08 DIAGNOSIS — Z950 Presence of cardiac pacemaker: Secondary | ICD-10-CM

## 2014-08-08 DIAGNOSIS — I10 Essential (primary) hypertension: Secondary | ICD-10-CM

## 2014-08-08 DIAGNOSIS — I495 Sick sinus syndrome: Secondary | ICD-10-CM

## 2014-08-08 DIAGNOSIS — I480 Paroxysmal atrial fibrillation: Secondary | ICD-10-CM

## 2014-08-08 DIAGNOSIS — I4891 Unspecified atrial fibrillation: Secondary | ICD-10-CM

## 2014-08-08 LAB — MDC_IDC_ENUM_SESS_TYPE_INCLINIC
Brady Statistic AP VS Percent: 59.7 %
Brady Statistic AS VS Percent: 40.1 %
Lead Channel Impedance Value: 440 Ohm
Lead Channel Sensing Intrinsic Amplitude: 9.7 mV
Lead Channel Setting Pacing Amplitude: 2 V
MDC IDC MSMT LEADCHNL RA SENSING INTR AMPL: 2.4 mV
MDC IDC MSMT LEADCHNL RV IMPEDANCE VALUE: 416 Ohm
MDC IDC SET LEADCHNL RV PACING AMPLITUDE: 2.5 V
MDC IDC SET LEADCHNL RV PACING PULSEWIDTH: 0.6 ms
MDC IDC SET LEADCHNL RV SENSING SENSITIVITY: 0.9 mV
MDC IDC SET ZONE DETECTION INTERVAL: 350 ms
MDC IDC STAT BRADY AP VP PERCENT: 0.1 %
MDC IDC STAT BRADY AS VP PERCENT: 0.1 % — AB
Zone Setting Detection Interval: 400 ms

## 2014-08-08 NOTE — Patient Instructions (Addendum)
Your physician recommends that you schedule a follow-up appointment in: 6 months with Dr.Croitoru on a day we can also check your [facility]

## 2014-08-10 ENCOUNTER — Encounter: Payer: Self-pay | Admitting: Cardiovascular Disease

## 2014-08-10 ENCOUNTER — Other Ambulatory Visit: Payer: Self-pay | Admitting: Internal Medicine

## 2014-08-10 NOTE — Addendum Note (Signed)
Addended by: Abram Sander on: 08/10/2014 02:02 PM   Modules accepted: Orders

## 2014-08-10 NOTE — Progress Notes (Signed)
Patient ID: [name], female   DOB: Dec 04, 1945, 69 y.o.   MRN: [id number]     Reason for office visit Atrial fibrillation Pacemaker check  Mrs. Horkey was recently hospitalized for a prolonged episode of atrial fibrillation with rapid ventricular response that was quite symptomatic. She underwent cardioversion on February 11 and a successfully maintained normal sinus rhythm until then. This is the first time she has actually undergone cardioversion since the previous episodes terminated spontaneously. This is her first significant breakthrough episode while on treatment with Rythmol but she felt quite poorly with it. She is interested in possibly undergoing radiofrequency ablation  Interrogation of her dual-chamber permanent pacemaker (Medtronic Revo MRI conditional) shows a 28 hour episode of atrial fibrillation without recurrence since the cardioversion. There is 60% atrial pacing and only 0.2% ventricular pacing. She has a borderline long QT interval ounces QTC 464 ms) on treatment with propafenone.  Allergies  Allergen Reactions  . Penicillins Anaphylaxis  . Azithromycin Other (See Comments)  . Epinephrine Other (See Comments)    Causes A-Fib  . Metoprolol     alopecia  . Lidocaine Palpitations    Or any similar medication    Current Outpatient Prescriptions  Medication Sig Dispense Refill  . atenolol (TENORMIN) 50 MG tablet Take 1 tablet (50 mg total) by mouth daily. 90 tablet 2  . Biotin 5000 MCG TABS Take 1 tablet by mouth daily.    . CELEBREX 200 MG capsule Take 200 mg by mouth 2 (two) times daily as needed for mild pain.     Marland Kitchen conjugated estrogens (PREMARIN) vaginal cream Place 1.5 g vaginally daily as needed.     . dicyclomine (BENTYL) 10 MG capsule TAKE ONE CAPSULE 3 TIMES DAILY BEFORE MEALS 90 capsule 2  . escitalopram (LEXAPRO) 20 MG tablet Take 20 mg by mouth daily.     Marland Kitchen estrogens, conjugated, (PREMARIN) 0.3 MG tablet Take 0.3 mg by mouth daily.     . furosemide  (LASIX) 20 MG tablet Take 20 mg by mouth daily as needed for fluid.     Marland Kitchen HYDROcodone-acetaminophen (NORCO/VICODIN) 5-325 MG per tablet Take 1 tablet by mouth every 6 (six) hours as needed for moderate pain.     Marland Kitchen levothyroxine (SYNTHROID, LEVOTHROID) 112 MCG tablet Take 112 mcg by mouth daily before breakfast.    . losartan (COZAAR) 50 MG tablet Take 1 tablet (50 mg total) by mouth 2 (two) times daily. 180 tablet 3  . PRADAXA 150 MG CAPS capsule TAKE ONE CAPSULE EVERY 12 HOURS 180 capsule 1  . propafenone (RYTHMOL SR) 325 MG 12 hr capsule Take 1 capsule (325 mg total) by mouth 2 (two) times daily. 180 capsule 2  . traZODone (DESYREL) 150 MG tablet Take 150 mg by mouth at bedtime.      . zaleplon (SONATA) 10 MG capsule Take 10 mg by mouth at bedtime.      No current facility-administered medications for this visit.    Past Medical History  Diagnosis Date  . Paroxysmal a-fib diagnosed 2008    a. On propafenone, Pradaxa.  . 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 Hypothyroid   . Anxiety   . Hypertension   . Glaucoma   . Complication of anesthesia     DIFFICULTY WAKING UP  . Arthritis   . Anticoagulant long-term use  WITH PRADAXA 05/27/2011  . DJD (degenerative joint disease)   . GERD (gastroesophageal reflux disease)   .  IBS (irritable bowel syndrome)   . Depression   . Diverticulosis   . External hemorrhoids   . Esophagitis 09/17/06  . History of stress test     a. 2008 - normal nuc.    Past Surgical History  Procedure Laterality Date  . Pacemaker insertion  04/08/11    MDT Revo implanted by Dr Rubie Maid  . Eye surgery Bilateral     cataracts  . Vaginal hysterectomy    . Liposuction    . Ovarian cyst removal    . Foot surgery    . Hand surgery      multiple  . Breast reduction surgery    . Facial cosmetic surgery    . Tonsillectomy    . Nm myocar perf wall motion  12/29/2006    No significant ischemia; EF 69%  . Cardioversion N/A  08/02/2014    Procedure: CARDIOVERSION;  Surgeon: Quintella Reichert, MD;  Location: [facility];  Service: Cardiovascular;  Laterality: N/A;    Family History  Problem Relation Age of Onset  . Anesthesia problems Mother   . Colon cancer Neg Hx   . Colon polyps Mother     beign    History   Social History  . Marital Status: Legally Separated    Spouse Name: N/A  . Number of Children: 3  . Years of Education: N/A   Occupational History  . retired    Social History Main Topics  . Smoking status: Former Games developer  . Smokeless tobacco: Never Used     Comment: Quit 15 years ago  . Alcohol Use: 3.6 oz/week    6 Glasses of wine per week     Comment: daily  . Drug Use: No  . Sexual Activity: Yes   Other Topics Concern  . Not on file   Social History Narrative   Lives in [place],  Former [employer],  Separated.      Review of systems: The patient specifically denies any chest pain at rest or with exertion, dyspnea at rest or with exertion, orthopnea, paroxysmal nocturnal dyspnea, syncope, palpitations, focal neurological deficits, intermittent claudication, lower extremity edema, unexplained weight gain, cough, hemoptysis or wheezing.  The patient also denies abdominal pain, nausea, vomiting, dysphagia, diarrhea, constipation, polyuria, polydipsia, dysuria, hematuria, frequency, urgency, abnormal bleeding or bruising, fever, chills, unexpected weight changes, mood swings, change in skin or hair texture, change in voice quality, auditory or visual problems, allergic reactions or rashes, new musculoskeletal complaints other than usual "aches and pains".   PHYSICAL EXAM BP 116/78 mmHg  Pulse 66  Resp 16  Ht 5\' 5"  (1.651 m)  Wt 153 lb 3.2 oz (69.491 kg)  BMI 25.49 kg/m2  General: Alert, oriented x3, no distress Head: no evidence of trauma, PERRL, EOMI, no exophtalmos or lid lag, no myxedema, no xanthelasma; normal ears, nose and oropharynx Neck: normal jugular venous  pulsations and no hepatojugular reflux; brisk carotid pulses without delay and no carotid bruits Chest: clear to auscultation, no signs of consolidation by percussion or palpation, normal fremitus, symmetrical and full respiratory excursions Cardiovascular: normal position and quality of the apical impulse, regular rhythm, normal first and second heart sounds, no murmurs, rubs or gallops Abdomen: no tenderness or distention, no masses by palpation, no abnormal pulsatility or arterial bruits, normal bowel sounds, no hepatosplenomegaly Extremities: no clubbing, cyanosis or edema; 2+ radial, ulnar and brachial pulses bilaterally; 2+ right femoral, posterior tibial and dorsalis pedis pulses; 2+ left femoral, posterior tibial and dorsalis pedis  pulses; no subclavian or femoral bruits Neurological: grossly nonfocal   EKG: Atrial paced, ventricular sensed, QRS slightly broadened 96 ms, QTC 464 ms, otherwise normal  Lipid Panel     Component Value Date/Time   CHOL  12/19/2006 0315    172        ATP III CLASSIFICATION:  <200     mg/dL   Desirable  824-235  mg/dL   Borderline High  >=361    mg/dL   High   TRIG 443* 15/40/0867 0315   HDL 37* 12/19/2006 0315   CHOLHDL 4.6 12/19/2006 0315   VLDL 59* 12/19/2006 0315   LDLCALC  12/19/2006 0315    76        Total Cholesterol/HDL:CHD Risk Coronary Heart Disease Risk Table                     Men   Women  1/2 Average Risk   3.4   3.3    BMET    Component Value Date/Time   NA 137 07/31/2014 2315   K 4.1 07/31/2014 2315   CL 108 07/31/2014 2315   CO2 24 07/31/2014 2315   GLUCOSE 114* 07/31/2014 2315   BUN 29* 07/31/2014 2315   CREATININE 1.00 07/31/2014 2315   CREATININE 1.05 02/23/2013 1550   CALCIUM 8.9 07/31/2014 2315   GFRNONAA 55* 07/31/2014 2315   GFRAA 64* 07/31/2014 2315     ASSESSMENT AND PLAN Normally functioning dual-chamber permanent pacemaker Medtronic Revo 2012 or tachycardia-bradycardia syndrome Recurrent paroxysmal  atrial fibrillation requiring cardioversion She is considering possible radiofrequency ablation and asked several questions about it. She has seen Dr. Johney Frame to discuss this about a year ago. On appropriate anticoagulation (chadsvasc at least 3) Orders Placed This Encounter  Procedures  . Implantable device check   No orders of the defined types were placed in this encounter.    Junious Silk, MD, [facility] [phone] office [phone] pager

## 2014-08-29 ENCOUNTER — Encounter (HOSPITAL_COMMUNITY): Payer: Self-pay | Admitting: Adult Health

## 2014-08-29 ENCOUNTER — Emergency Department (HOSPITAL_COMMUNITY)
Admission: EM | Admit: 2014-08-29 | Discharge: 2014-08-30 | Disposition: A | Discharge status: Home / Self Care | Payer: Medicare Other | Attending: Emergency Medicine | Admitting: Emergency Medicine

## 2014-08-29 ENCOUNTER — Emergency Department (HOSPITAL_COMMUNITY): Payer: Medicare Other

## 2014-08-29 DIAGNOSIS — F419 Anxiety disorder, unspecified: Secondary | ICD-10-CM | POA: Diagnosis not present

## 2014-08-29 DIAGNOSIS — F329 Major depressive disorder, single episode, unspecified: Secondary | ICD-10-CM | POA: Insufficient documentation

## 2014-08-29 DIAGNOSIS — E039 Hypothyroidism, unspecified: Secondary | ICD-10-CM | POA: Insufficient documentation

## 2014-08-29 DIAGNOSIS — Z8739 Personal history of other diseases of the musculoskeletal system and connective tissue: Secondary | ICD-10-CM | POA: Diagnosis not present

## 2014-08-29 DIAGNOSIS — I4891 Unspecified atrial fibrillation: Secondary | ICD-10-CM | POA: Insufficient documentation

## 2014-08-29 DIAGNOSIS — Z880 Allergy status to penicillin: Secondary | ICD-10-CM | POA: Diagnosis not present

## 2014-08-29 DIAGNOSIS — Z8669 Personal history of other diseases of the nervous system and sense organs: Secondary | ICD-10-CM | POA: Diagnosis not present

## 2014-08-29 DIAGNOSIS — I10 Essential (primary) hypertension: Secondary | ICD-10-CM | POA: Diagnosis not present

## 2014-08-29 DIAGNOSIS — Z87891 Personal history of nicotine dependence: Secondary | ICD-10-CM | POA: Insufficient documentation

## 2014-08-29 DIAGNOSIS — Z7901 Long term (current) use of anticoagulants: Secondary | ICD-10-CM | POA: Diagnosis not present

## 2014-08-29 DIAGNOSIS — Z950 Presence of cardiac pacemaker: Secondary | ICD-10-CM | POA: Insufficient documentation

## 2014-08-29 DIAGNOSIS — Z79899 Other long term (current) drug therapy: Secondary | ICD-10-CM | POA: Diagnosis not present

## 2014-08-29 DIAGNOSIS — Z8719 Personal history of other diseases of the digestive system: Secondary | ICD-10-CM | POA: Insufficient documentation

## 2014-08-29 LAB — CBC
HCT: 36 % (ref 36.0–46.0)
Hemoglobin: 11.6 g/dL — ABNORMAL LOW (ref 12.0–15.0)
MCH: 30.8 pg (ref 26.0–34.0)
MCHC: 32.2 g/dL (ref 30.0–36.0)
MCV: 95.5 fL (ref 78.0–100.0)
PLATELETS: 317 10*3/uL (ref 150–400)
RBC: 3.77 MIL/uL — AB (ref 3.87–5.11)
RDW: 13.3 % (ref 11.5–15.5)
WBC: 7.5 10*3/uL (ref 4.0–10.5)

## 2014-08-29 LAB — I-STAT TROPONIN, ED: TROPONIN I, POC: 0 ng/mL (ref 0.00–0.08)

## 2014-08-29 MED ORDER — DILTIAZEM HCL 25 MG/5ML IV SOLN
15.0000 mg | Freq: Once | INTRAVENOUS | Status: AC
  Administered 2014-08-30: 15 mg via INTRAVENOUS
  Filled 2014-08-29: qty 5

## 2014-08-29 NOTE — ED Notes (Signed)
Dr. Oni at bedside. 

## 2014-08-29 NOTE — ED Notes (Signed)
PResents with onset of fast heart rate-hx of RVR- rate is 150s, endorses back pain and bilateral shoulder pain.

## 2014-08-29 NOTE — ED Provider Notes (Signed)
CSN: [id number]     Arrival date & time 08/29/14  2228 History  This chart was scribed for Tomasita Crumble, MD by Bronson Curb, ED Scribe. This patient was seen in room D36C/D36C and the patient's care was started at 11:10 PM.     Chief Complaint  Patient presents with  . Atrial Fibrillation   The history is provided by the patient. No language interpreter was used.     HPI Comments: Jacqueline Kim is a 69 y.o. female, with history of A-fib, who presents to the Emergency Department complaining of palpitations (triage heart rate 151) that began 1 hour ago. Patient has a pacemaker in place, but is unable to determine if the AICd went off. She notes difficulty catching her breathing with onset, and also reports diarrhea yesterday. She reports history of A-fib and reports the last episode was months ago. However, she was informed episodes occur more frequently with age. Patient is currently on propafenone and atenolol. She denies vomiting, cold symptoms, or any change in appetite.   Patient has an appointment scheduled, with Dr. [name] (cardiologist), in 2 days.   Past Medical History  Diagnosis Date  . Paroxysmal a-fib diagnosed 2008    a. On propafenone, Pradaxa.  . 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 Hypothyroid   . Anxiety   . Hypertension   . Glaucoma   . Complication of anesthesia     DIFFICULTY WAKING UP  . Arthritis   . Anticoagulant long-term use  WITH PRADAXA 05/27/2011  . DJD (degenerative joint disease)   . GERD (gastroesophageal reflux disease)   . IBS (irritable bowel syndrome)   . Depression   . Diverticulosis   . External hemorrhoids   . Esophagitis 09/17/06  . History of stress test     a. 2008 - normal nuc.   Past Surgical History  Procedure Laterality Date  . Pacemaker insertion  04/08/11    MDT Revo implanted by Dr Rubie Maid  . Eye surgery Bilateral     cataracts  . Vaginal hysterectomy    . Liposuction    . Ovarian  cyst removal    . Foot surgery    . Hand surgery      multiple  . Breast reduction surgery    . Facial cosmetic surgery    . Tonsillectomy    . Nm myocar perf wall motion  12/29/2006    No significant ischemia; EF 69%  . Cardioversion N/A 08/02/2014    Procedure: CARDIOVERSION;  Surgeon: Quintella Reichert, MD;  Location: [facility];  Service: Cardiovascular;  Laterality: N/A;   Family History  Problem Relation Age of Onset  . Anesthesia problems Mother   . Colon cancer Neg Hx   . Colon polyps Mother     beign   History  Substance Use Topics  . Smoking status: Former Games developer  . Smokeless tobacco: Never Used     Comment: Quit 15 years ago  . Alcohol Use: 3.6 oz/week    6 Glasses of wine per week     Comment: daily   OB History    No data available     Review of Systems  A complete 10 system review of systems was obtained and all systems are negative except as noted in the HPI and PMH.     Allergies  Penicillins; Azithromycin; Epinephrine; Metoprolol; and Lidocaine  Home Medications   Prior to Admission medications   Medication Sig Start Date  End Date Taking? Authorizing Provider  atenolol (TENORMIN) 50 MG tablet Take 1 tablet (50 mg total) by mouth daily. 07/29/13   Chrystie Nose, MD  Biotin 5000 MCG TABS Take 1 tablet by mouth daily.    Historical Provider, MD  CELEBREX 200 MG capsule Take 200 mg by mouth 2 (two) times daily as needed for mild pain.  10/28/12   Historical Provider, MD  conjugated estrogens (PREMARIN) vaginal cream Place 1.5 g vaginally daily as needed.     Historical Provider, MD  dicyclomine (BENTYL) 10 MG capsule TAKE ONE CAPSULE 3 TIMES DAILY BEFORE MEALS 08/11/14   Hart Carwin, MD  escitalopram (LEXAPRO) 20 MG tablet Take 20 mg by mouth daily.     Historical Provider, MD  estrogens, conjugated, (PREMARIN) 0.3 MG tablet Take 0.3 mg by mouth daily.     Historical Provider, MD  furosemide (LASIX) 20 MG tablet Take 20 mg by mouth daily as needed for  fluid.  09/21/13   Historical Provider, MD  HYDROcodone-acetaminophen (NORCO/VICODIN) 5-325 MG per tablet Take 1 tablet by mouth every 6 (six) hours as needed for moderate pain.  09/19/13   Historical Provider, MD  levothyroxine (SYNTHROID, LEVOTHROID) 112 MCG tablet Take 112 mcg by mouth daily before breakfast.    Historical Provider, MD  losartan (COZAAR) 50 MG tablet Take 1 tablet (50 mg total) by mouth 2 (two) times daily. 10/13/13   [name], MD  PRADAXA 150 MG CAPS capsule TAKE ONE CAPSULE EVERY 12 HOURS 06/12/14   [name], MD  propafenone (RYTHMOL SR) 325 MG 12 hr capsule Take 1 capsule (325 mg total) by mouth 2 (two) times daily. 03/17/14   [name], MD  traZODone (DESYREL) 150 MG tablet Take 150 mg by mouth at bedtime.      Historical Provider, MD  zaleplon (SONATA) 10 MG capsule Take 10 mg by mouth at bedtime.     Historical Provider, MD   Triage Vitals: BP 119/95 mmHg  Pulse 151  Temp(Src) 98.7 F (37.1 C) (Oral)  Resp 18  Wt 153 lb (69.4 kg)  SpO2 96%  Physical Exam  Constitutional: She is oriented to person, place, and time. She appears well-developed and well-nourished. No distress.  HENT:  Head: Normocephalic and atraumatic.  Nose: Nose normal.  Mouth/Throat: Oropharynx is clear and moist. No oropharyngeal exudate.  Eyes: Conjunctivae and EOM are normal. Pupils are equal, round, and reactive to light. No scleral icterus.  Neck: Normal range of motion. Neck supple. No JVD present. No tracheal deviation present. No thyromegaly present.  Cardiovascular: Normal heart sounds.  An irregularly irregular rhythm present. Tachycardia present.  Exam reveals no gallop and no friction rub.   No murmur heard. Pulmonary/Chest: Effort normal and breath sounds normal. No respiratory distress. She has no wheezes. She exhibits no tenderness.  Abdominal: Soft. Bowel sounds are normal. She exhibits no distension and no mass. There is no tenderness. There is no rebound and no  guarding.  Musculoskeletal: Normal range of motion. She exhibits no edema or tenderness.  Lymphadenopathy:    She has no cervical adenopathy.  Neurological: She is alert and oriented to person, place, and time. No cranial nerve deficit. She exhibits normal muscle tone.  Skin: Skin is warm and dry. No rash noted. No erythema. No pallor.  Nursing note and vitals reviewed.   ED Course  Procedures (including critical care time)  DIAGNOSTIC STUDIES: Oxygen Saturation is 96% on room air, adequate by my interpretation.  COORDINATION OF CARE: At 2315 Discussed treatment plan with patient which includes regulation of heartrate. Patient agrees.   At 0014 Upon re-evaluation, patient states her symptoms have improved. Heart rate is now 108-110.  At 0050 Informed patient of abnormal electrolytes. Will discuss with cardiology of possible medication adjustment.  Labs Review Labs Reviewed  CBC - Abnormal; Notable for the following:    RBC 3.77 (*)    Hemoglobin 11.6 (*)    All other components within normal limits  BASIC METABOLIC PANEL - Abnormal; Notable for the following:    Glucose, Bld 121 (*)    BUN 29 (*)    GFR calc non Af Amer 52 (*)    GFR calc Af Amer 60 (*)    All other components within normal limits  URINALYSIS, ROUTINE W REFLEX MICROSCOPIC - Abnormal; Notable for the following:    Color, Urine STRAW (*)    All other components within normal limits  MAGNESIUM  I-STAT TROPOININ, ED    Imaging Review Dg Chest Port 1 View  08/30/2014   CLINICAL DATA:  Initial evaluation for atrial fibrillation, shortness of breath.  EXAM: PORTABLE CHEST - 1 VIEW  COMPARISON:  Prior study from 08/01/2014  FINDINGS: Left-sided transvenous pacemaker/ AICD is stable. Cardiac and mediastinal silhouettes are unchanged, and remain within normal limits.  Lungs are normally inflated. No focal infiltrate, pulmonary edema, or pleural effusion. There is no pneumothorax.  No acute osseus abnormality.   IMPRESSION: No active cardiopulmonary disease.   Electronically Signed   By: Rise Mu M.D.   On: 08/30/2014 00:34     EKG Interpretation   Date/Time:  Wednesday August 30 2014 00:21:17 EDT Ventricular Rate:  117 PR Interval:    QRS Duration: 79 QT Interval:  356 QTC Calculation: 497 R Axis:   17 Text Interpretation:  Atrial fibrillation Anteroseptal infarct, old  Confirmed by Erroll Luna [phone]) on 08/30/2014 12:38:54 AM      MDM   Final diagnoses:  None    Patient since emergency department with palpitations and shortness of breath. EKG reveals A. fib with RVR. Patient takes atenolol and propafenone at home for control. She has an appointment in 2 days with her cardiologist Dr. [name]. She states she has gone into RVR more often over the last couple of months. Patient was previously controlled with diltiazem during her last admission, this is given to her in the emergency department. Will also evaluate electrolytes and infectious workup for etiology of her A. fib with RVR.  Electrolytes are within normal limits however I will replace her potassium and magnesium so that they are 4 and 2 respectively. Infectious workup was negative. I spoke with Dr. Sampson Goon who is on-call for Dr. [name] who states it is okay to change the patient to diltiazem as this medication has worked twice in the past for rate control. She has a follow-up appointment in 2 days with Dr. [name], she is advised to attend this appointment for discussion for ablation. At this time the patient's vital signs remain within her normal limits, A. fib with RVR has resolved, patient safe for discharge.  CRITICAL CARE Performed by: Tomasita Crumble   Total critical care time:  A fib with RVR  Critical care time was exclusive of separately billable procedures and treating other patients.  Critical care was necessary to treat or prevent imminent or life-threatening deterioration.  Critical care was time  spent personally by me on the following activities: development of treatment plan  with patient and/or surrogate as well as nursing, discussions with consultants, evaluation of patient's response to treatment, examination of patient, obtaining history from patient or surrogate, ordering and performing treatments and interventions, ordering and review of laboratory studies, ordering and review of radiographic studies, pulse oximetry and re-evaluation of patient's condition.   I personally performed the services described in this documentation, which was scribed in my presence. The recorded information has been reviewed and is accurate.   Tomasita Crumble, MD 08/30/14 [phone]

## 2014-08-30 ENCOUNTER — Encounter: Payer: Self-pay | Admitting: Internal Medicine

## 2014-08-30 DIAGNOSIS — I4891 Unspecified atrial fibrillation: Secondary | ICD-10-CM | POA: Diagnosis not present

## 2014-08-30 LAB — T4, FREE: FREE T4: 1.01 ng/dL (ref 0.80–1.80)

## 2014-08-30 LAB — TSH: TSH: 6.63 u[IU]/mL — ABNORMAL HIGH (ref 0.350–4.500)

## 2014-08-30 LAB — URINALYSIS, ROUTINE W REFLEX MICROSCOPIC
Bilirubin Urine: NEGATIVE
Glucose, UA: NEGATIVE mg/dL
Hgb urine dipstick: NEGATIVE
Ketones, ur: NEGATIVE mg/dL
LEUKOCYTES UA: NEGATIVE
Nitrite: NEGATIVE
PH: 6 (ref 5.0–8.0)
Protein, ur: NEGATIVE mg/dL
Specific Gravity, Urine: 1.006 (ref 1.005–1.030)
Urobilinogen, UA: 0.2 mg/dL (ref 0.0–1.0)

## 2014-08-30 LAB — BASIC METABOLIC PANEL
Anion gap: 6 (ref 5–15)
BUN: 29 mg/dL — ABNORMAL HIGH (ref 6–23)
CALCIUM: 9.3 mg/dL (ref 8.4–10.5)
CO2: 26 mmol/L (ref 19–32)
Chloride: 103 mmol/L (ref 96–112)
Creatinine, Ser: 1.06 mg/dL (ref 0.50–1.10)
GFR, EST AFRICAN AMERICAN: 60 mL/min — AB (ref >90)
GFR, EST NON AFRICAN AMERICAN: 52 mL/min — AB (ref >90)
Glucose, Bld: 121 mg/dL — ABNORMAL HIGH (ref 70–99)
Potassium: 3.7 mmol/L (ref 3.5–5.1)
SODIUM: 135 mmol/L (ref 135–145)

## 2014-08-30 LAB — MAGNESIUM: Magnesium: 1.8 mg/dL (ref 1.5–2.5)

## 2014-08-30 MED ORDER — POTASSIUM CHLORIDE CRYS ER 20 MEQ PO TBCR
40.0000 meq | EXTENDED_RELEASE_TABLET | Freq: Once | ORAL | Status: AC
  Administered 2014-08-30: 40 meq via ORAL
  Filled 2014-08-30: qty 2

## 2014-08-30 MED ORDER — MAGNESIUM SULFATE 2 GM/50ML IV SOLN
2.0000 g | Freq: Once | INTRAVENOUS | Status: AC
  Administered 2014-08-30: 2 g via INTRAVENOUS
  Filled 2014-08-30: qty 50

## 2014-08-30 MED ORDER — DILTIAZEM HCL ER COATED BEADS 240 MG PO CP24: 240.0000 mg | ORAL_CAPSULE | Freq: Every day | ORAL | Status: DC

## 2014-08-30 NOTE — ED Notes (Signed)
Pt. Left with all belongings 

## 2014-08-30 NOTE — Discharge Instructions (Signed)
Atrial Fibrillation Jacqueline Kim,  I spoke with cardiology who suggested for you to stop taking atenolol and start taking diltiazem for your atrial fibrillation. Follow-up with Dr. [name] during your appointment on Thursday. If symptoms worsen come back to the emergency department immediately. Thank you. Atrial fibrillation is a condition that causes your heart to beat irregularly. It may also cause your heart to beat faster than normal. Atrial fibrillation can prevent your heart from pumping blood normally. It increases your risk of stroke and heart problems. HOME CARE  Take medications as told by your doctor.  Only take medications that your doctor says are safe. Some medications can make the condition worse or happen again.  If blood thinners were prescribed by your doctor, take them exactly as told. Too much can cause bleeding. Too little and you will not have the needed protection against stroke and other problems.  Perform blood tests at home if told by your doctor.  Perform blood tests exactly as told by your doctor.  Do not drink alcohol.  Do not drink beverages with caffeine such as coffee, soda, and some teas.  Maintain a healthy weight.  Do not use diet pills unless your doctor says they are safe. They may make heart problems worse.  Follow diet instructions as told by your doctor.  Exercise regularly as told by your doctor.  Keep all follow-up appointments. GET HELP IF:  You notice a change in the speed, rhythm, or strength of your heartbeat.  You suddenly begin peeing (urinating) more often.  You get tired more easily when moving or exercising. GET HELP RIGHT AWAY IF:   You have chest or belly (abdominal) pain.  You feel sick to your stomach (nauseous).  You are short of breath.  You suddenly have swollen feet and ankles.  You feel dizzy.  You face, arms, or legs feel numb or weak.  There is a change in your vision or speech. MAKE SURE YOU:   Understand  these instructions.  Will watch your condition.  Will get help right away if you are not doing well or get worse. Document Released: 03/11/2008 Document Revised: 10/17/2013 Document Reviewed: 07/13/2012 [facility] Patient Information 2015 [place], [place]. This information is not intended to replace advice given to you by your health care provider. Make sure you discuss any questions you have with your health care provider.

## 2014-08-31 ENCOUNTER — Ambulatory Visit (INDEPENDENT_AMBULATORY_CARE_PROVIDER_SITE_OTHER): Payer: Medicare Other | Admitting: Cardiovascular Disease

## 2014-08-31 ENCOUNTER — Encounter: Payer: Self-pay | Admitting: Cardiovascular Disease

## 2014-08-31 VITALS — BP 136/84 | HR 64 | Ht 66.0 in | Wt 153.7 lb

## 2014-08-31 DIAGNOSIS — I4891 Unspecified atrial fibrillation: Secondary | ICD-10-CM

## 2014-08-31 DIAGNOSIS — Z950 Presence of cardiac pacemaker: Secondary | ICD-10-CM

## 2014-08-31 DIAGNOSIS — I10 Essential (primary) hypertension: Secondary | ICD-10-CM

## 2014-08-31 DIAGNOSIS — Z7901 Long term (current) use of anticoagulants: Secondary | ICD-10-CM

## 2014-08-31 DIAGNOSIS — I480 Paroxysmal atrial fibrillation: Secondary | ICD-10-CM

## 2014-08-31 DIAGNOSIS — I495 Sick sinus syndrome: Secondary | ICD-10-CM | POA: Diagnosis not present

## 2014-08-31 MED ORDER — DILTIAZEM HCL ER COATED BEADS 120 MG PO CP24: 120.0000 mg | ORAL_CAPSULE | Freq: Every day | ORAL | Status: DC

## 2014-08-31 NOTE — Patient Instructions (Signed)
Your physician has recommended you make the following change in your medication...  1. Take atenolol in the morning 2. Take diltiazem 120mg  every evening 3. DO NOT take diltiazem 240mg  (prescribed in hospital?)  You have been referred to Dr. - [facility]  Your physician recommends that you schedule a follow-up appointment in: 4 months with Dr. Hillis Range

## 2014-09-02 ENCOUNTER — Encounter: Payer: Self-pay | Admitting: Cardiovascular Disease

## 2014-09-02 NOTE — Progress Notes (Signed)
Patient ID: Jacqueline Kim, female   DOB: [birth year], 69 y.o.   MRN: [id number]      HPI: Jacqueline Kim is a 69 y.o. female  who presents for follow-up cardiologic evaluation of her recurrent atrial fibrillation.    Jacqueline Kim has known history of sinus node dysfunction. She has history of paroxysmal atrial fibrillation. She is status post permanent pacemaker implanted on 04/08/2011 Medtronic Revo MRI compatible pacemaker. She also has a history of hypothyroidism. She has had recurrent episodes of paroxysmal atrial fibrillation and has been on pradaxa anticoagulation for several years after initially having been on warfarin.  She has been maintained on rhythmol SR 325 mg twice a day and atenolol 50 mg daily.  She also is on losartan 50 mg for hypertension.  She was hospitalized in January 2014 with recurrent AF in the setting of significant increased family stress. She is now separated and living in a different household from her husband [name].  She has had several episodes of recurrent AF and had an episode on 03/12/2013 which lasted for approximately 12 hours.  Her last episode of PAF on the Saturday before Easter when she was rushing with family members and trying to prepare dinner.  She was hospitalized and presented with a heart rate at 150.  She was started on IV Cardizem for rate control.  She spontaneously cardioverted to sinus rhythm on April 6 prior to a planned TEE cardioversion.  I saw her in followup of that hospitalization and suggested an EP evaluation.  She saw [name] for discussion concerning atrial fibrillation ablation.  Apparently, at that time, the patient was feeling well.  She opted against pursuing AF ablation presently.    A followup echo Doppler study on 10/24/2013 showed an ejection fraction of 60-65% and there was evidence for grade 1 diastolic function without wall motion abnormalities.  Her left atrial dimension was normal.    She is followed by [name] for  her pacemaker.  She was hospitalized in early February following a prolonged episode of atrial fibrillation with rapid ventricular response for which she was symptomatic.  She underwent her initial cardioversion on 07/27/2014.  When she saw [name] on August 08, 2014 interrogation of her pacemaker showed a 28 hour episode of atrial fibrillation without recurrence since the cardioversion.  She again developed a recurrent episode of atrial fibrillation on 08/30/2014 which led to an emergency room evaluation.  The emergency room.  She was treated with diltiazem.  Lateral lites were normal.  She was Kim potassium and magnesium replacement.  She was advised to start taking Cardizem CD 240 mg but she has not done this has continued to take her atenolol and propofenone.  She presents for evaluation.  Past Medical History  Diagnosis Date  . Paroxysmal a-fib diagnosed 2008    a. On propafenone, Pradaxa.  . 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 Hypothyroid   . Anxiety   . Hypertension   . Glaucoma   . Complication of anesthesia     DIFFICULTY WAKING UP  . Arthritis   . Anticoagulant long-term use  WITH PRADAXA 05/27/2011  . DJD (degenerative joint disease)   . GERD (gastroesophageal reflux disease)   . IBS (irritable bowel syndrome)   . Depression   . Diverticulosis   . External hemorrhoids   . Esophagitis 09/17/06  . History of stress test     a. 2008 - normal nuc.  Past Surgical History  Procedure Laterality Date  . Pacemaker insertion  04/08/11    MDT Revo implanted by Dr Recardo Evangelist  . Eye surgery Bilateral     cataracts  . Vaginal hysterectomy    . Liposuction    . Ovarian cyst removal    . Foot surgery    . Hand surgery      multiple  . Breast reduction surgery    . Facial cosmetic surgery    . Tonsillectomy    . Nm myocar perf wall motion  12/29/2006    No significant ischemia; EF 69%  . Cardioversion N/A 08/02/2014    Procedure:  CARDIOVERSION;  Surgeon: Sueanne Margarita, MD;  Location: [facility];  Service: Cardiovascular;  Laterality: N/A;    Allergies  Allergen Reactions  . Penicillins Anaphylaxis  . Azithromycin Other (See Comments)  . Epinephrine Other (See Comments)    Causes A-Fib  . Metoprolol     alopecia  . Lidocaine Palpitations    Or any similar medication    Current Outpatient Prescriptions  Medication Sig Dispense Refill  . atenolol (TENORMIN) 50 MG tablet Take 50 mg by mouth every morning.    . Biotin 5000 MCG TABS Take 1 tablet by mouth daily.    Marland Kitchen conjugated estrogens (PREMARIN) vaginal cream Place 1.5 g vaginally daily.     Marland Kitchen dicyclomine (BENTYL) 10 MG capsule TAKE ONE CAPSULE 3 TIMES DAILY BEFORE MEALS 90 capsule 1  . escitalopram (LEXAPRO) 20 MG tablet Take 20 mg by mouth daily.     Marland Kitchen estrogens, conjugated, (PREMARIN) 0.3 MG tablet Take 0.3 mg by mouth daily.     . furosemide (LASIX) 20 MG tablet Take 20 mg by mouth daily as needed for fluid.     Marland Kitchen HYDROcodone-acetaminophen (NORCO/VICODIN) 5-325 MG per tablet Take 1 tablet by mouth every 6 (six) hours as needed for moderate pain.     Marland Kitchen levothyroxine (SYNTHROID, LEVOTHROID) 112 MCG tablet Take 112 mcg by mouth daily before breakfast.    . losartan (COZAAR) 50 MG tablet Take 1 tablet (50 mg total) by mouth 2 (two) times daily. 180 tablet 3  . PRADAXA 150 MG CAPS capsule TAKE ONE CAPSULE EVERY 12 HOURS 180 capsule 1  . propafenone (RYTHMOL SR) 325 MG 12 hr capsule Take 1 capsule (325 mg total) by mouth 2 (two) times daily. 180 capsule 2  . traZODone (DESYREL) 150 MG tablet Take 150 mg by mouth at bedtime.      . zaleplon (SONATA) 10 MG capsule Take 10 mg by mouth at bedtime.     Marland Kitchen diltiazem (CARDIZEM CD) 120 MG 24 hr capsule Take 1 capsule (120 mg total) by mouth at bedtime. 30 capsule 6   No current facility-administered medications for this visit.    History   Social History  . Marital Status: Legally Separated    Spouse Name: N/A    . Number of Children: 3  . Years of Education: N/A   Occupational History  . retired    Social History Main Topics  . Smoking status: Former Smoker    Quit date: 08/30/1996  . Smokeless tobacco: Never Used  . Alcohol Use: 3.6 oz/week    6 Glasses of wine per week     Comment: daily  . Drug Use: No  . Sexual Activity: Yes   Other Topics Concern  . Not on file   Social History Narrative   Lives in [place],  Former Real [employer],  Separated.  Family History  Problem Relation Age of Onset  . Anesthesia problems Mother   . Colon cancer Neg Hx   . Colon polyps Mother     beign   ROS General: Negative; No fevers, chills, or night sweats;  HEENT: Negative; No changes in vision or hearing, sinus congestion, difficulty swallowing Pulmonary: Negative; No cough, wheezing, shortness of breath, hemoptysis Cardiovascular:  See HPI GI: Positive for irritable bowel syndrome for which she takes Bentyl before meals;  GU: Negative; No dysuria, hematuria, or difficulty voiding Musculoskeletal: Negative; no myalgias, joint pain, or weakness Hematologic/Oncology: Negative; no easy bruising, bleeding Endocrine: Negative; no heat/cold intolerance; no diabetes Neuro: Negative; no changes in balance, headaches Skin: Negative; No rashes or skin lesions Psychiatric: There is less stress and anxiety; No behavioral problems, depression Sleep: Negative; No snoring, daytime sleepiness, hypersomnolence, bruxism, restless legs, hypnogognic hallucinations, no cataplexy Other comprehensive 14 point system review is negative.   PE BP 136/84 mmHg  Pulse 64  Ht _0  (1.676 m)  Wt 153 lb 11.2 oz (69.718 kg)  BMI 24.82 kg/m2  General: Alert, oriented, no distress.  Skin: normal turgor, no rashes HEENT: Normocephalic, atraumatic. Pupils round and reactive; sclera anicteric;no lid lag.  Nose without nasal septal hypertrophy Mouth/Parynx benign; Mallinpatti scale 2 Neck: No JVD, no  carotid bruits with normal carotid upstroke. Lungs: clear to ausculatation and percussion; no wheezing or rales Chest wall: Nontender to palpation Heart: PMI is not displaced RRR, s1 s2 normal 1/6 systolic murmur; no S3 or S4 gallop.  No rubs, thrills or heaves. Abdomen: soft, nontender; no hepatosplenomehaly, BS+; abdominal aorta nontender and not dilated by palpation. Back: No CVA tenderness Pulses 2+ Extremities: no clubbing cyanosis or edema, Homan's sign negative  Neurologic: grossly nonfocal Psychologic: normal affect and mood.  ECG (independently read by me): Atrially paced rhythm at 64 bpm.  No AF.  QTC 435 ms.  December 2015 ECG (independently read by me): Atrially paced rhythm at 65 with prolonged AV conduction with a PR interval at 270 ms.  No significant ST segment changes.   June 2015 ECG (independently read by me): Atrially paced rhythm at 62 beats per minute with prolonged AV conduction with a PR interval of 244 ms.  QTc interval 444 ms.  Prior 09/22/2013 ECG (independently read by me): Atrial paced rhythm at 63 beats per minute with ventricular sensing.   LABS:  BMET  BMP Latest Ref Rng 08/29/2014 07/31/2014 03/26/2014  Glucose 70 - 99 mg/dL 121(H) 114(H) 84  BUN 6 - 23 mg/dL 29(H) 29(H) 16  Creatinine 0.50 - 1.10 mg/dL 1.06 1.00 0.91  Sodium 135 - 145 mmol/L 135 137 138  Potassium 3.5 - 5.1 mmol/L 3.7 4.1 4.5  Chloride 96 - 112 mmol/L 103 108 99  CO2 19 - 32 mmol/L _1 Calcium 8.4 - 10.5 mg/dL 9.3 8.9 9.2     Hepatic Function Panel   Hepatic Function Latest Ref Rng 07/31/2014 02/03/2013 09/10/2011  Total Protein 6.0 - 8.3 g/dL 5.9(L) 6.3 5.5(L)  Albumin 3.5 - 5.2 g/dL 3.3(L) 3.9 3.2(L)  AST 0 - 37 U/L _2 ALT 0 - 35 U/L _3 Alk Phosphatase 39 - 117 U/L 39 36(L) 26(L)  Total Bilirubin 0.3 - 1.2 mg/dL 0.4 0.3 0.2(L)  Bilirubin, Direct 0.0 - 0.3 mg/dL - - -     CBC  CBC Latest Ref Rng 08/29/2014 08/01/2014 07/31/2014  WBC 4.0 - 10.5 K/uL 7.5  9.6 10.1  Hemoglobin 12.0 - 15.0 g/dL 11.6(L) 10.3(L) 10.3(L)  Hematocrit 36.0 - 46.0 % 36.0 31.8(L) 31.5(L)  Platelets 150 - 400 K/uL 317 316 297     BNP    Component Value Date/Time   PROBNP 1110.0* 09/18/2013 0332    Lipid Panel     Component Value Date/Time   CHOL  12/19/2006 0315    172        ATP III CLASSIFICATION:  <200     mg/dL   Desirable  200-239  mg/dL   Borderline High  >=240    mg/dL   High   TRIG 293* 12/19/2006 0315   HDL 37* 12/19/2006 0315   CHOLHDL 4.6 12/19/2006 0315   VLDL 59* 12/19/2006 0315   LDLCALC  12/19/2006 0315    76        Total Cholesterol/HDL:CHD Risk Coronary Heart Disease Risk Table                     Men   Women  1/2 Average Risk   3.4   3.3     RADIOLOGY: No results found.    ASSESSMENT AND PLAN: Ms. Kyndle Schlender has a history of PAF and status post permanent pacemaker for sinus node dysfunction. She is anticoagulated with her Pradaxa and has a Chads2vasc score of at least 3. Her echo Doppler study on 10/24/2013 showed normal systolic function with an ejection fraction of 60-65%.  There was grade 1 diastolic dysfunction.  There were no regional wall motion abnormalities.  The left atrium was normal in size.  In the past, her episodes of atrial fibrillation have seen to occur during periods of increased stress.  Recently, she has experienced several episodes of recurrent AF which do not appear to be stress mediated.  In the past.  She had seen [name] on one occasion when I referred her to him for consideration of atrial fibrillation ablation.  She now has experienced 2 episodes of atrial fibrillation over the past month.  She is anticoagulated with Pradaxa denies any bleeding.  I am adding diltiazem 120 mg to take at bedtime to her current regimen which consists of atenolol 50 g every morning in addition to her Rythmol SR 325 twice a day.  I have discussed a reevaluation for sleep apnea.  She states in the past that she had  undergone evaluation and was told of not having this.  I will try to follow-up on this old sleep study.  With her recurrent PAF I have recommended she be reevaluated by [name] and is it my recommendation that she consider atrial fibrillation ablation.  I will see her in several months for follow-up evaluation.   Time spent: 25 minutes  Troy Sine, MD, [facility]  09/02/2014 6:58 PM

## 2014-09-21 ENCOUNTER — Ambulatory Visit (INDEPENDENT_AMBULATORY_CARE_PROVIDER_SITE_OTHER): Payer: Medicare Other | Admitting: Internal Medicine

## 2014-09-21 ENCOUNTER — Encounter: Payer: Self-pay | Admitting: Internal Medicine

## 2014-09-21 VITALS — BP 90/66 | HR 63 | Ht 66.0 in | Wt 158.8 lb

## 2014-09-21 DIAGNOSIS — I10 Essential (primary) hypertension: Secondary | ICD-10-CM | POA: Diagnosis not present

## 2014-09-21 NOTE — Patient Instructions (Signed)
Your physician recommends that you schedule a follow-up appointment as needed with Dr Johney Frame  Decrease Alcohol intake

## 2014-09-25 NOTE — Progress Notes (Signed)
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 female with a h/o paroxysmal atrial fibrillation and tachycardia bradycardia syndrome who presents today for EP follow-up.  She reports having atrial fibrillation for several years.  She underwent PPM (MDT) implant in 2012 for tachy/brady syndrome.  She was evaluated by me in 2015 and declined ablation.  She has had increasing frequency and duration of atrial fibrillation since that time.   During afib, she has tachypalpitations with chest discomfort.  During her most recent episodes, she spontaneously converted to sinus rhythm.  She has been reluctant to consider changes in her medicine.  She is a heavy drinker. Today, she denies symptoms of exertional chest pain, shortness of breath, orthopnea, PND, lower extremity edema, dizziness, presyncope, syncope, or neurologic sequela. The patient is tolerating medications without difficulties and is otherwise without complaint today.   Past Medical History  Diagnosis Date  . Paroxysmal a-fib diagnosed 2008    a. On propafenone, Pradaxa.  . 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 Hypothyroid   . Anxiety   . Hypertension   . Glaucoma   . Complication of anesthesia     DIFFICULTY WAKING UP  . Arthritis   . Anticoagulant long-term use  WITH PRADAXA 05/27/2011  . DJD (degenerative joint disease)   . GERD (gastroesophageal reflux disease)   . IBS (irritable bowel syndrome)   . Depression   . Diverticulosis   . External hemorrhoids   . Esophagitis 09/17/06  . History of stress test     a. 2008 - normal nuc.   Past Surgical History  Procedure Laterality Date  . Pacemaker insertion  04/08/11    MDT Revo implanted by Dr Rubie Maid  . Eye surgery Bilateral     cataracts  . Vaginal hysterectomy    . Liposuction    . Ovarian cyst removal    . Foot surgery    . Hand surgery      multiple  . Breast reduction  surgery    . Facial cosmetic surgery    . Tonsillectomy    . Nm myocar perf wall motion  12/29/2006    No significant ischemia; EF 69%  . Cardioversion N/A 08/02/2014    Procedure: CARDIOVERSION;  Surgeon: Quintella Reichert, MD;  Location: [facility];  Service: Cardiovascular;  Laterality: N/A;    Current Outpatient Prescriptions  Medication Sig Dispense Refill  . atenolol (TENORMIN) 50 MG tablet Take 50 mg by mouth every morning.    . Biotin 5000 MCG TABS Take 1 tablet by mouth daily.    Marland Kitchen conjugated estrogens (PREMARIN) vaginal cream Place 1.5 g vaginally daily.     . dabigatran (PRADAXA) 150 MG CAPS capsule TAKE ONE CAPSULE BY MOUTH EVERY 12 HOURS    . dicyclomine (BENTYL) 10 MG capsule TAKE ONE CAPSULE BY MOUTH 3 TIMES DAILY BEFORE MEALS    . diltiazem (CARDIZEM CD) 120 MG 24 hr capsule Take 1 capsule (120 mg total) by mouth at bedtime. 30 capsule 6  . escitalopram (LEXAPRO) 20 MG tablet Take 20 mg by mouth daily.     Marland Kitchen estrogens, conjugated, (PREMARIN) 0.3 MG tablet Take 0.3 mg by mouth daily.     . furosemide (LASIX) 20 MG tablet Take 20 mg by mouth daily as needed for fluid.     Marland Kitchen HYDROcodone-acetaminophen (NORCO) 7.5-325 MG per tablet Take 1 tablet by mouth every 6 (six)  hours as needed. Moderate pain    . levothyroxine (SYNTHROID, LEVOTHROID) 112 MCG tablet Take 112 mcg by mouth daily before breakfast.    . losartan (COZAAR) 50 MG tablet Take 1 tablet (50 mg total) by mouth 2 (two) times daily. 180 tablet 3  . propafenone (RYTHMOL SR) 325 MG 12 hr capsule Take 1 capsule (325 mg total) by mouth 2 (two) times daily. 180 capsule 2  . traZODone (DESYREL) 150 MG tablet Take 150 mg by mouth at bedtime.      . zaleplon (SONATA) 10 MG capsule Take 10 mg by mouth at bedtime.      No current facility-administered medications for this visit.    Allergies  Allergen Reactions  . Azithromycin Other (See Comments)    Causes heart rhythm issues  . Epinephrine Other (See Comments)    Causes  A-Fib  . Penicillins Anaphylaxis  . Metoprolol     alopecia  . Lidocaine Palpitations    Or any similar medication    History   Social History  . Marital Status: Legally Separated    Spouse Name: N/A  . Number of Children: 3  . Years of Education: N/A   Occupational History  . retired    Social History Main Topics  . Smoking status: Former Smoker    Quit date: 08/30/1996  . Smokeless tobacco: Never Used  . Alcohol Use: 3.6 oz/week    6 Glasses of wine per week     Comment: daily  . Drug Use: No  . Sexual Activity: Yes   Other Topics Concern  . Not on file   Social History Narrative   Lives in [place],  Former Real [employer],  Separated.      Family History  Problem Relation Age of Onset  . Anesthesia problems Mother   . Colon cancer Neg Hx   . Colon polyps Mother     beign    ROS- All systems are reviewed and negative except as per the HPI above  Physical Exam: Filed Vitals:   09/21/14 0946  BP: 90/66  Pulse: 63  Height: 5\' 6"  (1.676 m)  Weight: 158 lb 12.8 oz (72.031 kg)    GEN- The patient is well appearing, alert and oriented x 3 today.   Head- normocephalic, atraumatic Eyes-  Sclera clear, conjunctiva pink Ears- hearing intact Oropharynx- clear Neck- supple, no JVP Lymph- no cervical lymphadenopathy Lungs- Clear to ausculation bilaterally, normal work of breathing Heart- Regular rate and rhythm, no murmurs, rubs or gallops, PMI not laterally displaced GI- soft, NT, ND, + BS Extremities- no clubbing, cyanosis, or edema MS- no significant deformity or atrophy Skin- no rash or lesion Psych- euthymic mood, full affect Neuro- strength and sensation are intact  EKG today reveals atrial pacing, PR 184 msec, poor R wave progression Epic records including Dr notes and recent hospital records are reviewed PPM interrogation today is reviewed (See paceart).  Afib burden is 2.4%   Assessment and Plan:  1. Paroxysmal atrial  fibrillation The patient has symptomatic recurrent paroxysmal atrial fibrillation.  Her afib is not very well controlled with propafenone presently.  Her Chads2vasc score is at least 3.  She is appropriately anticoagulated with pradaxa. Therapeutic strategies for afib including medicine and ablation were discussed in detail with the patient today. Risk, benefits, and alternatives to EP study and radiofrequency ablation for afib were also discussed in detail today.  The importance of lifestyle modification was stressed today.  At this time, she  will consider ablation but would prefer to continue medical therapy.  She states that she was previously unaware of the association of ETOH with afib.  She would like to try ETOH cessation prior to ablation.  She will contact my office if she decides to proceed.  2. Tachy/brady syndrome S/p PPM Not interrogated today  3. HTN Stable No change required today  She will follow-up with Dr [name] as scheduled and I will see as needed going forward.  She is aware that she may contact me any time if she decides to consider ablation.  Hillis Range MD, [facility]

## 2014-09-26 ENCOUNTER — Telehealth: Payer: Self-pay | Admitting: *Deleted

## 2014-09-26 NOTE — Telephone Encounter (Signed)
Faxed surgical clearance to [facility] orthopedics to have left knee scope. Attention: Matthias Hughs @ [phone].

## 2014-10-02 ENCOUNTER — Telehealth: Payer: Self-pay | Admitting: Cardiovascular Disease

## 2014-10-02 NOTE — Telephone Encounter (Signed)
The risk of immediate clot/stroke complication is low. If she is back in normal rhythm , surgery should not be a problem. Orthopedic surgeon will restart anticoagulant shortly after surgery.

## 2014-10-02 NOTE — Telephone Encounter (Signed)
Spoke to patient, explained Dr. Erin Hearing advice. She voiced understanding.

## 2014-10-02 NOTE — Telephone Encounter (Signed)
Pt had atrial fib last night. She is very concerned,because she is scheduled for knee surgery tomorrow. Should she cancel her surgery tomorrow,she been off her blood thinner for a day?

## 2014-10-02 NOTE — Telephone Encounter (Signed)
Spoke to patient. She has known history of A fib, has reoccurrences ~1 time monthly that have been fairly well managed.  She reports episode last night.  Concerned because she has been off her blood thinner. She wants physician opinion on whether to cancel/reschedule knee surgery tomorrow.  Advised her I would defer to [facility].

## 2014-10-13 ENCOUNTER — Other Ambulatory Visit: Payer: Self-pay | Admitting: Internal Medicine

## 2014-10-20 ENCOUNTER — Other Ambulatory Visit: Payer: Self-pay | Admitting: Internal Medicine

## 2014-10-30 ENCOUNTER — Encounter: Payer: Self-pay | Admitting: *Deleted

## 2014-10-30 ENCOUNTER — Telehealth: Payer: Self-pay | Admitting: Cardiovascular Disease

## 2014-10-30 ENCOUNTER — Telehealth: Payer: Self-pay | Admitting: *Deleted

## 2014-10-30 NOTE — Telephone Encounter (Signed)
Letter written. Fax sent to Dr. Thomasena Edis' scheduler.

## 2014-10-30 NOTE — Telephone Encounter (Signed)
She need a release  for Cardiac clarence for knee surgery. Would you please fax to Dr [facility].

## 2014-10-30 NOTE — Telephone Encounter (Signed)
She is clear for knee surgery from a cardiac standpoint. She will need to hold pradaxa 48 hours prior to surgery.  Elimelech Houseman [place] MD, [facility]

## 2014-10-30 NOTE — Telephone Encounter (Signed)
Pt called back to see if this can be done this week - informed Dr. [name] out of office this week - will route to [facility] to advise.

## 2014-10-30 NOTE — Telephone Encounter (Addendum)
Pt requesting cardiac clearance - advise if pt needs to be seen.  This is for surgery on meniscus tear.  Pt would like to schedule asap d/t acute limitations w/ this extremity.

## 2014-10-30 NOTE — Telephone Encounter (Signed)
Faxed cardiac clearance to [facility].

## 2014-11-21 ENCOUNTER — Telehealth: Payer: Self-pay | Admitting: Cardiovascular Disease

## 2014-11-21 NOTE — Telephone Encounter (Signed)
Pt called in wanting some samples of Propafenone 325mg . She says she just needs enough to hold her until her mail order comes in. Please call  Thanks

## 2014-11-21 NOTE — Telephone Encounter (Signed)
I have informed his pt.that we don't have samples of rhymol

## 2014-12-15 ENCOUNTER — Telehealth: Payer: Self-pay | Admitting: Cardiovascular Disease

## 2014-12-15 NOTE — Telephone Encounter (Signed)
Pt called in wanting to speak to Burna Mortimer about some prescriptions she needs refilled, but she says that she can not get them refilled at the pharmacy. Please f/u with the pt  Thanks

## 2014-12-18 ENCOUNTER — Emergency Department (HOSPITAL_COMMUNITY)
Admission: EM | Admit: 2014-12-18 | Discharge: 2014-12-18 | Disposition: A | Discharge status: Home / Self Care | Payer: Medicare Other | Source: Home / Self Care | Attending: Emergency Medicine | Admitting: Emergency Medicine

## 2014-12-18 ENCOUNTER — Encounter (HOSPITAL_COMMUNITY): Payer: Self-pay | Admitting: Emergency Medicine

## 2014-12-18 ENCOUNTER — Emergency Department (INDEPENDENT_AMBULATORY_CARE_PROVIDER_SITE_OTHER)
Admission: EM | Admit: 2014-12-18 | Discharge: 2014-12-18 | Disposition: A | Discharge status: Nursing Home (Includes ICF) | Payer: Medicare Other | Source: Home / Self Care

## 2014-12-18 DIAGNOSIS — H9202 Otalgia, left ear: Secondary | ICD-10-CM

## 2014-12-18 DIAGNOSIS — L03317 Cellulitis of buttock: Secondary | ICD-10-CM

## 2014-12-18 DIAGNOSIS — L0231 Cutaneous abscess of buttock: Secondary | ICD-10-CM

## 2014-12-18 MED ORDER — LIDOCAINE HCL (PF) 1 % IJ SOLN
INTRAMUSCULAR | Status: AC
  Filled 2014-12-18: qty 5

## 2014-12-18 MED ORDER — CLINDAMYCIN HCL 150 MG PO CAPS: 300.0000 mg | ORAL_CAPSULE | Freq: Three times a day (TID) | ORAL | Status: DC

## 2014-12-18 MED ORDER — LIDOCAINE HCL (PF) 1 % IJ SOLN
30.0000 mL | Freq: Once | INTRAMUSCULAR | Status: AC
  Administered 2014-12-18: 30 mL via INTRADERMAL
  Filled 2014-12-18: qty 30

## 2014-12-18 MED ORDER — HYDROCODONE-ACETAMINOPHEN 5-325 MG PO TABS
1.0000 | ORAL_TABLET | Freq: Once | ORAL | Status: AC
  Administered 2014-12-18: 1 via ORAL
  Filled 2014-12-18: qty 1

## 2014-12-18 MED ORDER — OXYCODONE HCL 5 MG PO TABS
5.0000 mg | ORAL_TABLET | Freq: Once | ORAL | Status: AC
  Administered 2014-12-18: 5 mg via ORAL
  Filled 2014-12-18: qty 1

## 2014-12-18 NOTE — Discharge Instructions (Signed)
It was nice seeing you today, I am sorry about your abscess which we were unable to drain completely due to pain. I am recommencing intravenous antibiotic in the hospital.  Abscess Care After An abscess (also called a boil or furuncle) is an infected area that contains a collection of pus. Signs and symptoms of an abscess include pain, tenderness, redness, or hardness, or you may feel a moveable soft area under your skin. An abscess can occur anywhere in the body. The infection may spread to surrounding tissues causing cellulitis. A cut (incision) by the surgeon was made over your abscess and the pus was drained out. Gauze may have been packed into the space to provide a drain that will allow the cavity to heal from the inside outwards. The boil may be painful for 5 to 7 days. Most people with a boil do not have high fevers. Your abscess, if seen early, may not have localized, and may not have been lanced. If not, another appointment may be required for this if it does not get better on its own or with medications. HOME CARE INSTRUCTIONS   Only take over-the-counter or prescription medicines for pain, discomfort, or fever as directed by your caregiver.  When you bathe, soak and then remove gauze or iodoform packs at least daily or as directed by your caregiver. You may then wash the wound gently with mild soapy water. Repack with gauze or do as your caregiver directs. SEEK IMMEDIATE MEDICAL CARE IF:   You develop increased pain, swelling, redness, drainage, or bleeding in the wound site.  You develop signs of generalized infection including muscle aches, chills, fever, or a general ill feeling.  An oral temperature above 102 F (38.9 C) develops, not controlled by medication. See your caregiver for a recheck if you develop any of the symptoms described above. If medications (antibiotics) were prescribed, take them as directed. Document Released: 12/19/2004 Document Revised: 08/25/2011 Document  Reviewed: 08/16/2007 [facility] Patient Information 2015 [place], [place]. This information is not intended to replace advice given to you by your health care provider. Make sure you discuss any questions you have with your health care provider.

## 2014-12-18 NOTE — ED Notes (Signed)
Cellulitis around site marked with skin marker.  Pt signed but e-sign not coming up on pad.  Pt verbalized understanding of all discharge instructions.  Pt able to walk with assistance to the wheelchair, gait steady and even.

## 2014-12-18 NOTE — ED Notes (Signed)
MD at bedside. 

## 2014-12-18 NOTE — Discharge Instructions (Signed)

## 2014-12-18 NOTE — ED Notes (Signed)
C/o abscess  States she has an abscess on left cheek for two days States area is swollen

## 2014-12-18 NOTE — ED Provider Notes (Signed)
CSN: [id number]     Arrival date & time 12/18/14  1917 History   First MD Initiated Contact with Patient 12/18/14 1940     Chief Complaint  Patient presents with  . Abscess     (Consider location/radiation/quality/duration/timing/severity/associated sxs/prior Treatment) Patient is a 69 y.o. female presenting with abscess. The history is provided by the patient.  Abscess Location:  Ano-genital Ano-genital abscess location:  L buttock Size:  < 1 cm abscess with > 10 cm surrounding cellulitis Abscess quality: fluctuance, induration, painful, redness and warmth   Abscess quality: not draining   Red streaking: yes   Progression:  Worsening Pain details:    Quality:  Dull and aching   Severity:  Moderate   Timing:  Constant   Progression:  Worsening Chronicity:  New Context: not diabetes and not injected drug use   Relieved by:  Nothing Worsened by:  Nothing tried Ineffective treatments:  None tried Associated symptoms: fatigue   Associated symptoms: no fever, no headaches, no nausea and no vomiting   Risk factors: no prior abscess     Past Medical History  Diagnosis Date  . Paroxysmal a-fib diagnosed 2008    a. On propafenone, Pradaxa.  . 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 Hypothyroid   . Anxiety   . Hypertension   . Glaucoma   . Complication of anesthesia     DIFFICULTY WAKING UP  . Arthritis   . Anticoagulant long-term use  WITH PRADAXA 05/27/2011  . DJD (degenerative joint disease)   . GERD (gastroesophageal reflux disease)   . IBS (irritable bowel syndrome)   . Depression   . Diverticulosis   . External hemorrhoids   . Esophagitis 09/17/06  . History of stress test     a. 2008 - normal nuc.   Past Surgical History  Procedure Laterality Date  . Pacemaker insertion  04/08/11    MDT Revo implanted by Dr Rubie Maid  . Eye surgery Bilateral     cataracts  . Vaginal hysterectomy    . Liposuction    . Ovarian cyst removal     . Foot surgery    . Hand surgery      multiple  . Breast reduction surgery    . Facial cosmetic surgery    . Tonsillectomy    . Nm myocar perf wall motion  12/29/2006    No significant ischemia; EF 69%  . Cardioversion N/A 08/02/2014    Procedure: CARDIOVERSION;  Surgeon: Quintella Reichert, MD;  Location: [facility];  Service: Cardiovascular;  Laterality: N/A;   Family History  Problem Relation Age of Onset  . Anesthesia problems Mother   . Colon cancer Neg Hx   . Colon polyps Mother     beign   History  Substance Use Topics  . Smoking status: Former Smoker    Quit date: 08/30/1996  . Smokeless tobacco: Never Used  . Alcohol Use: 3.6 oz/week    6 Glasses of wine per week     Comment: daily   OB History    No data available     Review of Systems  Constitutional: Positive for fatigue. Negative for fever and chills.  Respiratory: Negative for cough, chest tightness and shortness of breath.   Cardiovascular: Negative for chest pain.  Gastrointestinal: Negative for nausea, vomiting and abdominal pain.  Musculoskeletal: Positive for myalgias. Negative for back pain.  Skin: Positive for rash.  Neurological: Negative for weakness, light-headedness and headaches.  Psychiatric/Behavioral: Negative for confusion.  All other systems reviewed and are negative.     Allergies  Azithromycin; Epinephrine; Penicillins; Metoprolol; and Lidocaine  Home Medications   Prior to Admission medications   Medication Sig Start Date End Date Taking? Authorizing Provider  atenolol (TENORMIN) 50 MG tablet Take 50 mg by mouth every morning.    Historical Provider, MD  Biotin 5000 MCG TABS Take 1 tablet by mouth daily.    Historical Provider, MD  conjugated estrogens (PREMARIN) vaginal cream Place 1.5 g vaginally daily.     Historical Provider, MD  dabigatran (PRADAXA) 150 MG CAPS capsule TAKE ONE CAPSULE BY MOUTH EVERY 12 HOURS    Historical Provider, MD  dicyclomine (BENTYL) 10 MG capsule  TAKE ONE CAPSULE BY MOUTH 3 TIMES DAILY BEFORE MEALS    Historical Provider, MD  dicyclomine (BENTYL) 10 MG capsule TAKE ONE CAPSULE 3 TIMES DAILY BEFORE MEALS 10/20/14   Hart Carwin, MD  diltiazem (CARDIZEM CD) 120 MG 24 hr capsule Take 1 capsule (120 mg total) by mouth at bedtime. 08/31/14   [name], MD  escitalopram (LEXAPRO) 20 MG tablet Take 20 mg by mouth daily.     Historical Provider, MD  estrogens, conjugated, (PREMARIN) 0.3 MG tablet Take 0.3 mg by mouth daily.     Historical Provider, MD  furosemide (LASIX) 20 MG tablet Take 20 mg by mouth daily as needed for fluid.  09/21/13   Historical Provider, MD  HYDROcodone-acetaminophen (NORCO) 7.5-325 MG per tablet Take 1 tablet by mouth every 6 (six) hours as needed. Moderate pain 09/18/14   Historical Provider, MD  levothyroxine (SYNTHROID, LEVOTHROID) 112 MCG tablet Take 112 mcg by mouth daily before breakfast.    Historical Provider, MD  losartan (COZAAR) 50 MG tablet Take 1 tablet (50 mg total) by mouth 2 (two) times daily. 10/13/13   [name], MD  propafenone (RYTHMOL SR) 325 MG 12 hr capsule Take 1 capsule (325 mg total) by mouth 2 (two) times daily. 03/17/14   [name], MD  traZODone (DESYREL) 150 MG tablet Take 150 mg by mouth at bedtime.      Historical Provider, MD  zaleplon (SONATA) 10 MG capsule Take 10 mg by mouth at bedtime.     Historical Provider, MD   BP 110/83 mmHg  Pulse 64  Temp(Src) 98.5 F (36.9 C) (Oral)  Resp 17  Ht 5\' 6"  (1.676 m)  Wt 150 lb (68.04 kg)  BMI 24.22 kg/m2  SpO2 98% Physical Exam  Constitutional: She is oriented to person, place, and time. She appears well-developed and well-nourished. No distress.  HENT:  Head: Normocephalic and atraumatic.  Nose: Nose normal.  Mouth/Throat: Oropharynx is clear and moist. No oropharyngeal exudate.  Eyes: EOM are normal. Pupils are equal, round, and reactive to light.  Neck: Normal range of motion. Neck supple.  Cardiovascular: Normal rate, regular  rhythm, normal heart sounds and intact distal pulses.   No murmur heard. Pulmonary/Chest: Effort normal and breath sounds normal. No respiratory distress. She has no wheezes. She exhibits no tenderness.  Abdominal: Soft. There is no tenderness. There is no rebound and no guarding.  Musculoskeletal: Normal range of motion. She exhibits no tenderness.  Lymphadenopathy:    She has no cervical adenopathy.  Neurological: She is alert and oriented to person, place, and time. No cranial nerve deficit. Coordination normal.  Skin: Skin is warm and dry. She is not diaphoretic.     Psychiatric: She has a normal mood  and affect. Her behavior is normal. Judgment and thought content normal.  Nursing note and vitals reviewed.   ED Course  Irrigation and debridement Date/Time: 12/18/2014 10:30 PM Performed by: Lenell Antu Authorized by: Gwyneth Sprout Consent: Verbal consent not obtained. Risks and benefits: risks, benefits and alternatives were discussed Consent given by: patient Required items: required blood products, implants, devices, and special equipment available Patient identity confirmed: verbally with patient Time out: Immediately prior to procedure a "time out" was called to verify the correct patient, procedure, equipment, support staff and site/side marked as required. Preparation: Patient was prepped and draped in the usual sterile fashion. Local anesthesia used: yes Anesthesia: local infiltration Local anesthetic: lidocaine 2% without epinephrine Anesthetic total: 4 ml Patient sedated: no Patient tolerance: Patient tolerated the procedure well with no immediate complications   (including critical care time)  EMERGENCY DEPARTMENT US SOFT TISSUE INTERPRETATION "Study: Limited Ultrasound of the noted body part in comments below"  INDICATIONS: Soft tissue infection Multiple views of the body part are obtained with a multi-frequency linear probe  PERFORMED BY:   Myself  IMAGES ARCHIVED?: Yes  SIDE:Left  BODY PART:Other soft tisse (comment in note) Left gluteus   FINDINGS: Abcess present and Cellulitis present  LIMITATIONS:  none  INTERPRETATION:  Abcess present and Cellulitis present  COMMENT:  Small fluid pocket in a large cellulitic area on left gluteus.     Labs Review Labs Reviewed - No data to display  Imaging Review No results found.   EKG Interpretation None      MDM   Final diagnoses:  Left buttock abscess  Cellulitis of buttock   Pt is a 69 yo F with hx of tachy/brady syndrome, Afib (on pradaxa), HTN, IBS, who presents with left buttock abscess.  3 days of gradually worsening left buttock pain.  Associated fatigue and generalized myalgias.  Seen in urgent care clinic today with attempted I/D of the abscess but unable to tolerate it without lidocaine.  Unfortunately the UC did not have any lidocaine without epinephrine in it and the patient has a hx of reaction to epinephrine injections.  Sent to [facility] ED for further evaluation.    Has a small fluctuant area at left gluteus with surrounding ~ 10 cm of erythema.   POCUS shows a small pocket of fluid under the fluctuance and cellulitis under the erythematous skin.   She was given roxicodone for pain.   Injected with local lidocaine and underwent I/D of the small abscess.  Minimal purulence and a small amount of blood was drained.  Irrigated with saline then covered with bacitracin and gauze.  Pt tolerated well.   After discussion with pharmacy, patient was given Rx for clindamycin x 7 days.  The cellulitis was marked with a marker and she was advised to return with increased erythema, pain, fevers, or with any other questions.  Given instructions on wound care.  Discharged in good condition.    If performed, labs, EKGs, and imaging were reviewed and interpreted by myself and my attending, and incorporated in the medical decision making.  Patient was seen with ED Attending,  Dr. Lawernce Keas, MD       Lenell Antu, MD 12/19/14 1455  Gwyneth Sprout, MD 12/21/14 [phone]

## 2014-12-18 NOTE — ED Notes (Signed)
Patient with abscess on left buttock cheek.  Patient is from [facility], unable to drain it due to multiple health problems and allergies.  Patient sent to ED.

## 2014-12-18 NOTE — ED Provider Notes (Signed)
CSN: [id number]     Arrival date & time 12/18/14  1636 History   None    Chief Complaint  Patient presents with  . Abscess   (Consider location/radiation/quality/duration/timing/severity/associated sxs/prior Treatment) Patient is a 69 y.o. female presenting with abscess and ear pain. The history is provided by the patient.  Abscess Location:  Ano-genital Ano-genital abscess location:  L buttock Size:  10cm by 7cm Abscess quality: induration, painful, redness and warmth   Abscess quality: not draining, no itching and not weeping   Abscess quality comment:  Pain is about 8/10 in severity. Duration:  2 days Progression:  Worsening Pain details:    Quality:  Aching   Timing:  Constant   Progression:  Worsening Chronicity:  New Context: not diabetes, not injected drug use, not insect bite/sting and not skin injury   Relieved by:  Nothing Exacerbated by: lying on her buttock. Associated symptoms: no fever   Risk factors: no hx of MRSA and no prior abscess   Otalgia Location:  Left Severity:  Moderate Onset quality:  Gradual Duration:  1 day Timing:  Intermittent Chronicity:  New Associated symptoms: no fever     Past Medical History  Diagnosis Date  . Paroxysmal a-fib diagnosed 2008    a. On propafenone, Pradaxa.  . 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 Hypothyroid   . Anxiety   . Hypertension   . Glaucoma   . Complication of anesthesia     DIFFICULTY WAKING UP  . Arthritis   . Anticoagulant long-term use  WITH PRADAXA 05/27/2011  . DJD (degenerative joint disease)   . GERD (gastroesophageal reflux disease)   . IBS (irritable bowel syndrome)   . Depression   . Diverticulosis   . External hemorrhoids   . Esophagitis 09/17/06  . History of stress test     a. 2008 - normal nuc.   Past Surgical History  Procedure Laterality Date  . Pacemaker insertion  04/08/11    MDT Revo implanted by Dr Rubie Maid  . Eye surgery Bilateral     cataracts  . Vaginal hysterectomy    . Liposuction    . Ovarian cyst removal    . Foot surgery    . Hand surgery      multiple  . Breast reduction surgery    . Facial cosmetic surgery    . Tonsillectomy    . Nm myocar perf wall motion  12/29/2006    No significant ischemia; EF 69%  . Cardioversion N/A 08/02/2014    Procedure: CARDIOVERSION;  Surgeon: Quintella Reichert, MD;  Location: [facility];  Service: Cardiovascular;  Laterality: N/A;   Family History  Problem Relation Age of Onset  . Anesthesia problems Mother   . Colon cancer Neg Hx   . Colon polyps Mother     beign   History  Substance Use Topics  . Smoking status: Former Smoker    Quit date: 08/30/1996  . Smokeless tobacco: Never Used  . Alcohol Use: 3.6 oz/week    6 Glasses of wine per week     Comment: daily   OB History    No data available     Review of Systems  Constitutional: Negative for fever.  HENT: Positive for ear pain.   Respiratory: Negative.   Cardiovascular: Negative.   Genitourinary: Negative.   Skin:       Skin abscess.    Allergies  Azithromycin; Epinephrine; Penicillins; Metoprolol; and Lidocaine  Home Medications   Prior to Admission medications   Medication Sig Start Date End Date Taking? Authorizing Provider  atenolol (TENORMIN) 50 MG tablet Take 50 mg by mouth every morning.    Historical Provider, MD  Biotin 5000 MCG TABS Take 1 tablet by mouth daily.    Historical Provider, MD  conjugated estrogens (PREMARIN) vaginal cream Place 1.5 g vaginally daily.     Historical Provider, MD  dabigatran (PRADAXA) 150 MG CAPS capsule TAKE ONE CAPSULE BY MOUTH EVERY 12 HOURS    Historical Provider, MD  dicyclomine (BENTYL) 10 MG capsule TAKE ONE CAPSULE BY MOUTH 3 TIMES DAILY BEFORE MEALS    Historical Provider, MD  dicyclomine (BENTYL) 10 MG capsule TAKE ONE CAPSULE 3 TIMES DAILY BEFORE MEALS 10/20/14   Hart Carwin, MD  diltiazem (CARDIZEM CD) 120 MG 24 hr capsule Take 1 capsule (120 mg  total) by mouth at bedtime. 08/31/14   [name], MD  escitalopram (LEXAPRO) 20 MG tablet Take 20 mg by mouth daily.     Historical Provider, MD  estrogens, conjugated, (PREMARIN) 0.3 MG tablet Take 0.3 mg by mouth daily.     Historical Provider, MD  furosemide (LASIX) 20 MG tablet Take 20 mg by mouth daily as needed for fluid.  09/21/13   Historical Provider, MD  HYDROcodone-acetaminophen (NORCO) 7.5-325 MG per tablet Take 1 tablet by mouth every 6 (six) hours as needed. Moderate pain 09/18/14   Historical Provider, MD  levothyroxine (SYNTHROID, LEVOTHROID) 112 MCG tablet Take 112 mcg by mouth daily before breakfast.    Historical Provider, MD  losartan (COZAAR) 50 MG tablet Take 1 tablet (50 mg total) by mouth 2 (two) times daily. 10/13/13   [name], MD  propafenone (RYTHMOL SR) 325 MG 12 hr capsule Take 1 capsule (325 mg total) by mouth 2 (two) times daily. 03/17/14   [name], MD  traZODone (DESYREL) 150 MG tablet Take 150 mg by mouth at bedtime.      Historical Provider, MD  zaleplon (SONATA) 10 MG capsule Take 10 mg by mouth at bedtime.     Historical Provider, MD   BP 118/76 mmHg  Pulse 72  Temp(Src) 97.8 F (36.6 C) (Oral)  Resp 18  SpO2 95% Physical Exam  Constitutional: She appears well-developed. No distress.  HENT:  Right Ear: External ear normal.  Cardiovascular: Normal rate and regular rhythm.   No murmur heard. Pulmonary/Chest: Effort normal and breath sounds normal. No respiratory distress. She has no wheezes.  Skin: Skin is warm. There is erythema.     Nursing note and vitals reviewed.   ED Course  INCISION AND DRAINAGE Date/Time: 12/18/2014 7:01 PM Performed by: Janit Pagan T Authorized by: Janit Pagan T Consent: Verbal consent obtained. Risks and benefits: risks, benefits and alternatives were discussed Consent given by: patient Patient understanding: patient states understanding of the procedure being performed Patient consent: the patient's  understanding of the procedure matches consent given Procedure consent: procedure consent matches procedure scheduled Relevant documents: relevant documents present and verified Test results: test results not available Site marked: the operative site was marked Imaging studies: imaging studies available Patient identity confirmed: verbally with patient Time out: Immediately prior to procedure a "time out" was called to verify the correct patient, procedure, equipment, support staff and site/side marked as required. Type: abscess Body area: anogenital (left gluteal cheek) Local anesthetic: declined anesthesia due to allergy to Lidocaine. Cooling agent sprayed on. Patient sedated: no Scalpel size: 11 Incision  type: single straight Drainage: bloody (Patient could not tolerate deep incision) Patient tolerance of procedure: Procedure was discontinued since she was not able to tolerate it.   (including critical care time) Labs Review Labs Reviewed - No data to display  Imaging Review No results found.   MDM  No diagnosis found. Left gluteal abscess. Otalgia   Incision and drainage started, patient requested that procedure be discontinued due to pain. I recommended IV A/B treatment due to size of her abscess without successful drainage. Patient agreed with plan.  Otalgia: Likely viral infection.               Ear exam normal.               F/U prn.    Doreene Eland, MD 12/18/14 [phone]

## 2014-12-20 ENCOUNTER — Encounter (HOSPITAL_COMMUNITY): Payer: Self-pay | Admitting: Physical Medicine and Rehabilitation

## 2014-12-20 ENCOUNTER — Emergency Department (HOSPITAL_COMMUNITY): Payer: Medicare Other

## 2014-12-20 ENCOUNTER — Inpatient Hospital Stay (HOSPITAL_COMMUNITY)
Admission: EM | Admit: 2014-12-20 | Discharge: 2014-12-22 | DRG: 603 | Disposition: A | Discharge status: Home Health | Payer: Medicare Other | Attending: Internal Medicine | Admitting: Internal Medicine

## 2014-12-20 DIAGNOSIS — K219 Gastro-esophageal reflux disease without esophagitis: Secondary | ICD-10-CM | POA: Diagnosis present

## 2014-12-20 DIAGNOSIS — I480 Paroxysmal atrial fibrillation: Secondary | ICD-10-CM | POA: Diagnosis present

## 2014-12-20 DIAGNOSIS — L03317 Cellulitis of buttock: Principal | ICD-10-CM | POA: Diagnosis present

## 2014-12-20 DIAGNOSIS — Z7902 Long term (current) use of antithrombotics/antiplatelets: Secondary | ICD-10-CM | POA: Diagnosis not present

## 2014-12-20 DIAGNOSIS — E039 Hypothyroidism, unspecified: Secondary | ICD-10-CM | POA: Diagnosis present

## 2014-12-20 DIAGNOSIS — E860 Dehydration: Secondary | ICD-10-CM | POA: Diagnosis present

## 2014-12-20 DIAGNOSIS — F329 Major depressive disorder, single episode, unspecified: Secondary | ICD-10-CM | POA: Diagnosis present

## 2014-12-20 DIAGNOSIS — Z880 Allergy status to penicillin: Secondary | ICD-10-CM | POA: Diagnosis not present

## 2014-12-20 DIAGNOSIS — E871 Hypo-osmolality and hyponatremia: Secondary | ICD-10-CM | POA: Diagnosis present

## 2014-12-20 DIAGNOSIS — F419 Anxiety disorder, unspecified: Secondary | ICD-10-CM | POA: Diagnosis present

## 2014-12-20 DIAGNOSIS — I10 Essential (primary) hypertension: Secondary | ICD-10-CM | POA: Diagnosis present

## 2014-12-20 DIAGNOSIS — L0390 Cellulitis, unspecified: Secondary | ICD-10-CM | POA: Diagnosis present

## 2014-12-20 DIAGNOSIS — Z950 Presence of cardiac pacemaker: Secondary | ICD-10-CM | POA: Diagnosis not present

## 2014-12-20 DIAGNOSIS — Z881 Allergy status to other antibiotic agents status: Secondary | ICD-10-CM

## 2014-12-20 DIAGNOSIS — N179 Acute kidney failure, unspecified: Secondary | ICD-10-CM

## 2014-12-20 DIAGNOSIS — Z7901 Long term (current) use of anticoagulants: Secondary | ICD-10-CM | POA: Diagnosis not present

## 2014-12-20 DIAGNOSIS — H409 Unspecified glaucoma: Secondary | ICD-10-CM | POA: Diagnosis present

## 2014-12-20 DIAGNOSIS — Z87891 Personal history of nicotine dependence: Secondary | ICD-10-CM

## 2014-12-20 DIAGNOSIS — K589 Irritable bowel syndrome without diarrhea: Secondary | ICD-10-CM | POA: Diagnosis present

## 2014-12-20 DIAGNOSIS — L0231 Cutaneous abscess of buttock: Secondary | ICD-10-CM | POA: Diagnosis present

## 2014-12-20 HISTORY — DX: Cellulitis of buttock: L03.317

## 2014-12-20 HISTORY — DX: Presence of cardiac pacemaker: Z95.0

## 2014-12-20 LAB — URINE MICROSCOPIC-ADD ON

## 2014-12-20 LAB — I-STAT CG4 LACTIC ACID, ED: Lactic Acid, Venous: 0.72 mmol/L (ref 0.5–2.0)

## 2014-12-20 LAB — CBC WITH DIFFERENTIAL/PLATELET
BASOS ABS: 0 10*3/uL (ref 0.0–0.1)
Basophils Relative: 0 % (ref 0–1)
EOS ABS: 0.1 10*3/uL (ref 0.0–0.7)
EOS PCT: 1 % (ref 0–5)
HCT: 31.7 % — ABNORMAL LOW (ref 36.0–46.0)
Hemoglobin: 10.7 g/dL — ABNORMAL LOW (ref 12.0–15.0)
LYMPHS ABS: 0.9 10*3/uL (ref 0.7–4.0)
LYMPHS PCT: 7 % — AB (ref 12–46)
MCH: 30.5 pg (ref 26.0–34.0)
MCHC: 33.8 g/dL (ref 30.0–36.0)
MCV: 90.3 fL (ref 78.0–100.0)
Monocytes Absolute: 0.9 10*3/uL (ref 0.1–1.0)
Monocytes Relative: 8 % (ref 3–12)
NEUTROS PCT: 84 % — AB (ref 43–77)
Neutro Abs: 10.4 10*3/uL — ABNORMAL HIGH (ref 1.7–7.7)
Platelets: 157 10*3/uL (ref 150–400)
RBC: 3.51 MIL/uL — AB (ref 3.87–5.11)
RDW: 13.5 % (ref 11.5–15.5)
WBC: 12.3 10*3/uL — ABNORMAL HIGH (ref 4.0–10.5)

## 2014-12-20 LAB — BASIC METABOLIC PANEL
Anion gap: 7 (ref 5–15)
BUN: 26 mg/dL — AB (ref 6–20)
CHLORIDE: 105 mmol/L (ref 101–111)
CO2: 20 mmol/L — AB (ref 22–32)
Calcium: 8.2 mg/dL — ABNORMAL LOW (ref 8.9–10.3)
Creatinine, Ser: 1.63 mg/dL — ABNORMAL HIGH (ref 0.44–1.00)
GFR calc non Af Amer: 31 mL/min — ABNORMAL LOW (ref >60)
GFR, EST AFRICAN AMERICAN: 36 mL/min — AB (ref >60)
Glucose, Bld: 134 mg/dL — ABNORMAL HIGH (ref 65–99)
POTASSIUM: 3.7 mmol/L (ref 3.5–5.1)
Sodium: 132 mmol/L — ABNORMAL LOW (ref 135–145)

## 2014-12-20 LAB — COMPREHENSIVE METABOLIC PANEL
ALK PHOS: 47 U/L (ref 38–126)
ALT: 14 U/L (ref 14–54)
AST: 25 U/L (ref 15–41)
Albumin: 3 g/dL — ABNORMAL LOW (ref 3.5–5.0)
Anion gap: 10 (ref 5–15)
BUN: 34 mg/dL — ABNORMAL HIGH (ref 6–20)
CO2: 18 mmol/L — ABNORMAL LOW (ref 22–32)
Calcium: 8.5 mg/dL — ABNORMAL LOW (ref 8.9–10.3)
Chloride: 99 mmol/L — ABNORMAL LOW (ref 101–111)
Creatinine, Ser: 2.12 mg/dL — ABNORMAL HIGH (ref 0.44–1.00)
GFR calc Af Amer: 26 mL/min — ABNORMAL LOW (ref >60)
GFR calc non Af Amer: 22 mL/min — ABNORMAL LOW (ref >60)
Glucose, Bld: 125 mg/dL — ABNORMAL HIGH (ref 65–99)
Potassium: 3.8 mmol/L (ref 3.5–5.1)
Sodium: 127 mmol/L — ABNORMAL LOW (ref 135–145)
Total Bilirubin: 0.5 mg/dL (ref 0.3–1.2)
Total Protein: 6 g/dL — ABNORMAL LOW (ref 6.5–8.1)

## 2014-12-20 LAB — URINALYSIS, ROUTINE W REFLEX MICROSCOPIC
Bilirubin Urine: NEGATIVE
GLUCOSE, UA: NEGATIVE mg/dL
KETONES UR: NEGATIVE mg/dL
LEUKOCYTES UA: NEGATIVE
Nitrite: NEGATIVE
Protein, ur: NEGATIVE mg/dL
Specific Gravity, Urine: 1.012 (ref 1.005–1.030)
UROBILINOGEN UA: 0.2 mg/dL (ref 0.0–1.0)
pH: 5 (ref 5.0–8.0)

## 2014-12-20 MED ORDER — METRONIDAZOLE IN NACL 5-0.79 MG/ML-% IV SOLN
500.0000 mg | Freq: Three times a day (TID) | INTRAVENOUS | Status: DC
  Administered 2014-12-20 – 2014-12-22 (×7): 500 mg via INTRAVENOUS
  Filled 2014-12-20 (×9): qty 100

## 2014-12-20 MED ORDER — ESCITALOPRAM OXALATE 20 MG PO TABS
20.0000 mg | ORAL_TABLET | Freq: Every day | ORAL | Status: DC
  Administered 2014-12-21 – 2014-12-22 (×2): 20 mg via ORAL
  Filled 2014-12-20 (×2): qty 1

## 2014-12-20 MED ORDER — ESTROGENS CONJUGATED 0.3 MG PO TABS
0.3000 mg | ORAL_TABLET | ORAL | Status: DC
  Administered 2014-12-21: 0.3 mg via ORAL
  Filled 2014-12-20: qty 1

## 2014-12-20 MED ORDER — SODIUM CHLORIDE 0.9 % IV BOLUS (SEPSIS)
1000.0000 mL | INTRAVENOUS | Status: AC
  Administered 2014-12-20 (×2): 1000 mL via INTRAVENOUS

## 2014-12-20 MED ORDER — SODIUM CHLORIDE 0.9 % IV BOLUS (SEPSIS)
500.0000 mL | INTRAVENOUS | Status: AC
  Administered 2014-12-20: 500 mL via INTRAVENOUS

## 2014-12-20 MED ORDER — SODIUM CHLORIDE 0.9 % IJ SOLN
3.0000 mL | Freq: Two times a day (BID) | INTRAMUSCULAR | Status: DC
  Administered 2014-12-20 – 2014-12-22 (×3): 3 mL via INTRAVENOUS

## 2014-12-20 MED ORDER — ATENOLOL 50 MG PO TABS
50.0000 mg | ORAL_TABLET | Freq: Every morning | ORAL | Status: DC
  Administered 2014-12-21 – 2014-12-22 (×2): 50 mg via ORAL
  Filled 2014-12-20 (×2): qty 1

## 2014-12-20 MED ORDER — PROPAFENONE HCL ER 325 MG PO CP12
325.0000 mg | ORAL_CAPSULE | Freq: Two times a day (BID) | ORAL | Status: DC
  Administered 2014-12-20 – 2014-12-22 (×4): 325 mg via ORAL
  Filled 2014-12-20 (×5): qty 1

## 2014-12-20 MED ORDER — DILTIAZEM HCL ER COATED BEADS 120 MG PO CP24
120.0000 mg | ORAL_CAPSULE | Freq: Every day | ORAL | Status: DC
  Administered 2014-12-20 – 2014-12-21 (×2): 120 mg via ORAL
  Filled 2014-12-20 (×3): qty 1

## 2014-12-20 MED ORDER — ACETAMINOPHEN 325 MG PO TABS
650.0000 mg | ORAL_TABLET | Freq: Four times a day (QID) | ORAL | Status: DC | PRN
  Administered 2014-12-22: 650 mg via ORAL
  Filled 2014-12-20: qty 2

## 2014-12-20 MED ORDER — TRAZODONE HCL 150 MG PO TABS
150.0000 mg | ORAL_TABLET | Freq: Every day | ORAL | Status: DC
  Administered 2014-12-20 – 2014-12-21 (×2): 150 mg via ORAL
  Filled 2014-12-20 (×3): qty 1

## 2014-12-20 MED ORDER — DABIGATRAN ETEXILATE MESYLATE 150 MG PO CAPS: 150.0000 mg | ORAL_CAPSULE | Freq: Two times a day (BID) | ORAL | Status: DC

## 2014-12-20 MED ORDER — DABIGATRAN ETEXILATE MESYLATE 75 MG PO CAPS
75.0000 mg | ORAL_CAPSULE | Freq: Two times a day (BID) | ORAL | Status: DC
  Administered 2014-12-20 – 2014-12-21 (×3): 75 mg via ORAL
  Filled 2014-12-20 (×5): qty 1

## 2014-12-20 MED ORDER — ACETAMINOPHEN 650 MG RE SUPP: 650.0000 mg | Freq: Four times a day (QID) | RECTAL | Status: DC | PRN

## 2014-12-20 MED ORDER — SODIUM CHLORIDE 0.9 % IV SOLN
1000.0000 mL | INTRAVENOUS | Status: DC
  Administered 2014-12-20 – 2014-12-21 (×4): 1000 mL via INTRAVENOUS

## 2014-12-20 MED ORDER — MORPHINE SULFATE 2 MG/ML IJ SOLN: 2.0000 mg | INTRAMUSCULAR | Status: DC | PRN

## 2014-12-20 MED ORDER — METRONIDAZOLE IN NACL 5-0.79 MG/ML-% IV SOLN: 500.0000 mg | Freq: Once | INTRAVENOUS | Status: DC

## 2014-12-20 MED ORDER — VANCOMYCIN HCL 10 G IV SOLR
1250.0000 mg | Freq: Once | INTRAVENOUS | Status: AC
  Administered 2014-12-20: 1250 mg via INTRAVENOUS
  Filled 2014-12-20: qty 1250

## 2014-12-20 MED ORDER — LEVOTHYROXINE SODIUM 112 MCG PO TABS
112.0000 ug | ORAL_TABLET | Freq: Every evening | ORAL | Status: DC
  Administered 2014-12-22: 112 ug via ORAL
  Filled 2014-12-20 (×3): qty 1

## 2014-12-20 MED ORDER — SODIUM CHLORIDE 0.9 % IV SOLN
1250.0000 mg | INTRAVENOUS | Status: DC
  Administered 2014-12-21: 1250 mg via INTRAVENOUS
  Filled 2014-12-20 (×2): qty 1250

## 2014-12-20 MED ORDER — SODIUM CHLORIDE 0.9 % IV SOLN: INTRAVENOUS | Status: DC

## 2014-12-20 MED ORDER — HYDROCODONE-ACETAMINOPHEN 7.5-325 MG PO TABS
1.0000 | ORAL_TABLET | Freq: Four times a day (QID) | ORAL | Status: DC | PRN
Start: 2014-12-20 — End: 2014-12-22
  Administered 2014-12-20 – 2014-12-22 (×4): 1 via ORAL
  Filled 2014-12-20 (×4): qty 1

## 2014-12-20 MED ORDER — PANTOPRAZOLE SODIUM 40 MG PO TBEC
40.0000 mg | DELAYED_RELEASE_TABLET | Freq: Every day | ORAL | Status: DC
  Administered 2014-12-21 – 2014-12-22 (×2): 40 mg via ORAL
  Filled 2014-12-20 (×2): qty 1

## 2014-12-20 NOTE — Telephone Encounter (Signed)
°  1. Which medications need to be refilled? Atenolol  2. Which pharmacy is medication to be sent to?[facility]  3. Do they need a 30 day or 90 day supply? 90  4. Would they like a call back once the medication has been sent to the pharmacy? Yes   Pt's daughter called in also stating that she needs her Pradaxa refill but since she allowed her insurance to lapse the medication is going to be too expensive and will need to switch to Warfarin.

## 2014-12-20 NOTE — ED Provider Notes (Signed)
CSN: [id number]     Arrival date & time 12/20/14  1204 History   First MD Initiated Contact with Patient 12/20/14 1249     Chief Complaint  Patient presents with  . Medication Reaction  . Fatigue     (Consider location/radiation/quality/duration/timing/severity/associated sxs/prior Treatment) HPI  69 year old female with history of hypertension, IBS, tachycardia/bradycardia syndrome, atrial fibrillation currently on Pradaxa who presents for evaluation of generalized fatigue. History obtained through patient and through daughter who is at bedside. Patient reports she developed an infection to the left buttock for the past 5 days. She was seen in the ER 3 days ago for her symptoms and was diagnosed with having a small abscess to her left buttock with surrounding cellulitis. She had an I&D procedure and was discharged on clindamycin. Since then, she noticed increased generalized fatigue, has several unwitnessed fall at home without loss of consciousness. She knows she is sleeping more than usual and reports increasing pain in her left buttock and left thigh. Daughter visit her today and noticed that she is incoherent and appears confused. She was brought here for further evaluation. Patient reports she has been taking her antibiotic as prescribed and no new medication changes. She denies fever, chills, chest pain, difficulty breathing, productive cough, abdominal cramping, nausea vomiting or diarrhea, or dysuria. Patient thinks she fell twice in the past today, once she was in her bathtub and the other episode was when she was walking down the hall.    Past Medical History  Diagnosis Date  . Paroxysmal a-fib diagnosed 2008    a. On propafenone, Pradaxa.  . 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 Hypothyroid   . Anxiety   . Hypertension   . Glaucoma   . Complication of anesthesia     DIFFICULTY WAKING UP  . Arthritis   . Anticoagulant long-term use  WITH  PRADAXA 05/27/2011  . DJD (degenerative joint disease)   . GERD (gastroesophageal reflux disease)   . IBS (irritable bowel syndrome)   . Depression   . Diverticulosis   . External hemorrhoids   . Esophagitis 09/17/06  . History of stress test     a. 2008 - normal nuc.   Past Surgical History  Procedure Laterality Date  . Pacemaker insertion  04/08/11    MDT Revo implanted by Dr Rubie Maid  . Eye surgery Bilateral     cataracts  . Vaginal hysterectomy    . Liposuction    . Ovarian cyst removal    . Foot surgery    . Hand surgery      multiple  . Breast reduction surgery    . Facial cosmetic surgery    . Tonsillectomy    . Nm myocar perf wall motion  12/29/2006    No significant ischemia; EF 69%  . Cardioversion N/A 08/02/2014    Procedure: CARDIOVERSION;  Surgeon: Quintella Reichert, MD;  Location: [facility];  Service: Cardiovascular;  Laterality: N/A;   Family History  Problem Relation Age of Onset  . Anesthesia problems Mother   . Colon cancer Neg Hx   . Colon polyps Mother     beign   History  Substance Use Topics  . Smoking status: Former Smoker    Quit date: 08/30/1996  . Smokeless tobacco: Never Used  . Alcohol Use: 3.6 oz/week    6 Glasses of wine per week     Comment: daily   OB History    No data  available     Review of Systems  All other systems reviewed and are negative.     Allergies  Azithromycin; Epinephrine; Penicillins; Metoprolol; and Lidocaine  Home Medications   Prior to Admission medications   Medication Sig Start Date End Date Taking? Authorizing Provider  atenolol (TENORMIN) 50 MG tablet Take 50 mg by mouth every morning.    Historical Provider, MD  Biotin 5000 MCG TABS Take 1 tablet by mouth daily.    Historical Provider, MD  clindamycin (CLEOCIN) 150 MG capsule Take 2 capsules (300 mg total) by mouth 3 (three) times daily. 12/18/14 12/25/14  Lenell Antu, MD  conjugated estrogens (PREMARIN) vaginal cream Place 1.5 g vaginally daily.      Historical Provider, MD  dabigatran (PRADAXA) 150 MG CAPS capsule TAKE ONE CAPSULE BY MOUTH EVERY 12 HOURS    Historical Provider, MD  dicyclomine (BENTYL) 10 MG capsule TAKE ONE CAPSULE BY MOUTH 3 TIMES DAILY BEFORE MEALS    Historical Provider, MD  dicyclomine (BENTYL) 10 MG capsule TAKE ONE CAPSULE 3 TIMES DAILY BEFORE MEALS Patient not taking: Reported on 12/18/2014 10/20/14   Hart Carwin, MD  diltiazem (CARDIZEM CD) 120 MG 24 hr capsule Take 1 capsule (120 mg total) by mouth at bedtime. 08/31/14   [name], MD  escitalopram (LEXAPRO) 20 MG tablet Take 20 mg by mouth daily.     Historical Provider, MD  estrogens, conjugated, (PREMARIN) 0.3 MG tablet Take 0.3 mg by mouth daily.     Historical Provider, MD  furosemide (LASIX) 20 MG tablet Take 20 mg by mouth daily as needed for fluid.  09/21/13   Historical Provider, MD  HYDROcodone-acetaminophen (NORCO) 7.5-325 MG per tablet Take 1 tablet by mouth every 6 (six) hours as needed. Moderate pain 09/18/14   Historical Provider, MD  levothyroxine (SYNTHROID, LEVOTHROID) 112 MCG tablet Take 112 mcg by mouth daily before breakfast.    Historical Provider, MD  losartan (COZAAR) 50 MG tablet Take 1 tablet (50 mg total) by mouth 2 (two) times daily. 10/13/13   [name], MD  propafenone (RYTHMOL SR) 325 MG 12 hr capsule Take 1 capsule (325 mg total) by mouth 2 (two) times daily. 03/17/14   [name], MD  traZODone (DESYREL) 150 MG tablet Take 150 mg by mouth at bedtime.      Historical Provider, MD  zaleplon (SONATA) 10 MG capsule Take 10 mg by mouth at bedtime.     Historical Provider, MD   BP 104/76 mmHg  Pulse 64  Temp(Src) 98.4 F (36.9 C) (Oral)  Resp 20  SpO2 95% Physical Exam  Constitutional: She is oriented to person, place, and time. She appears well-developed and well-nourished. No distress.  Elderly Caucasian female who appears to be in no acute distress.  HENT:  Head: Atraumatic.  Mouth/Throat: Oropharynx is clear and moist.   Eyes: Conjunctivae are normal.  Neck: Neck supple.  No nuchal rigidity  Cardiovascular: Normal rate and regular rhythm.   Pulmonary/Chest: Breath sounds normal. She has no wheezes. She has no rales. She exhibits no tenderness.  Mildly tachypneic.  Abdominal: Soft. There is no tenderness.  Musculoskeletal: She exhibits tenderness (bilateral elbow with ecchymosis noted to the dorsum of elbows, full range of motion, no crepitus and minimal pain.).  Neurological: She is alert and oriented to person, place, and time. GCS eye subscore is 4. GCS verbal subscore is 5. GCS motor subscore is 6.  Purposeful movement to all 4 extremities without focal weakness.  Although patient is alert and oriented 3 she has moments of memory relapse and repeatedly asked the same questions.  Skin: Rash (Left buttock: Increased skin erythema which spreads beyond the marked margin. No obvious induration or fluctuance noted.) noted.  Psychiatric: She has a normal mood and affect.  Nursing note and vitals reviewed.   ED Course  Procedures (including critical care time)  Patient with left buttock abscess cellulitis, previous I and D procedure 3 days ago current on clindamycin however cellulitic skin change progressed. Patient meets sepsis criteria with hypotension, tachypneia and source of infection.    3:03 PM Worsening cellulitis fell outpatient therapy, will give patient vancomycin and Flagyl because she is allergic to penicillin. Evidence of acute kidney injury with BUN 34, creatinine 2.12. IV fluid hydration given. Evidence of low sodium of 878 which certainly can contribute to her delirium. Mild leukocytosis with WBC 12.3 and a left shift. Chest x-ray without acute abnormalities. Her urinalysis has not resulted yet.   3:04 PM Appreciate consultation from [facility] hospitalist, Dr. Elvera Lennox who agrees to admit patient to telemetry bed under his care. Patient is made aware of plan.  Care discussed with Dr. Judd Lien.     Labs Review Labs Reviewed  COMPREHENSIVE METABOLIC PANEL - Abnormal; Notable for the following:    Sodium 127 (*)    Chloride 99 (*)    CO2 18 (*)    Glucose, Bld 125 (*)    BUN 34 (*)    Creatinine, Ser 2.12 (*)    Calcium 8.5 (*)    Total Protein 6.0 (*)    Albumin 3.0 (*)    GFR calc non Af Amer 22 (*)    GFR calc Af Amer 26 (*)    All other components within normal limits  CBC WITH DIFFERENTIAL/PLATELET - Abnormal; Notable for the following:    WBC 12.3 (*)    RBC 3.51 (*)    Hemoglobin 10.7 (*)    HCT 31.7 (*)    Neutrophils Relative % 84 (*)    Neutro Abs 10.4 (*)    Lymphocytes Relative 7 (*)    All other components within normal limits  CULTURE, BLOOD (ROUTINE X 2)  CULTURE, BLOOD (ROUTINE X 2)  URINE CULTURE  URINALYSIS, ROUTINE W REFLEX MICROSCOPIC (NOT AT [facility])  I-STAT CG4 LACTIC ACID, ED    Imaging Review Dg Chest Port 1 View  12/20/2014   CLINICAL DATA:  Weakness. Hypertension. Symptoms today. Initial encounter.  EXAM: PORTABLE CHEST - 1 VIEW  COMPARISON:  Single view of the chest 08/29/2014. PA and lateral chest 08/01/2014.  FINDINGS: And pacing device is in place, unchanged. The lungs are clear. Heart size is normal. There is no pneumothorax or pleural effusion. No focal bony abnormality is identified.  IMPRESSION: No acute disease.   Electronically Signed   By: Drusilla Kanner M.D.   On: 12/20/2014 13:52     EKG Interpretation   Date/Time:  Wednesday December 20 2014 13:26:22 EDT Ventricular Rate:  61 PR Interval:  81 QRS Duration: 96 QT Interval:  431 QTC Calculation: 434 R Axis:   26 Text Interpretation:  Atrial-paced rhythm Consider anterior infarct  Confirmed by DELO  MD, DOUGLAS ([id number]) on 12/20/2014 1:35:00 PM      MDM   Final diagnoses:  Cellulitis of left buttock  AKI (acute kidney injury)    BP 102/48 mmHg  Pulse 59  Temp(Src) 97.6 F (36.4 C) (Oral)  Resp 15  Wt 157 lb (71.215 kg)  SpO2 95%  I have reviewed nursing notes and  vital signs. I personally viewed the imaging tests through PACS system and agrees with radiologist's intepretation I reviewed available ER/hospitalization records through the EMR       Fayrene Helper, PA-C 12/20/14 1508  Geoffery Lyons, MD 12/20/14 [id number]

## 2014-12-20 NOTE — ED Notes (Signed)
Pt reports she recently had I&D performed, now states she is concerned all of her medications she is taking are making her tired. States she has been sleeping more lately. Pt is alert and oriented x4.

## 2014-12-20 NOTE — H&P (Signed)
History and Physical    Jacqueline Kim JWJ:[id number] DOB: Aug 08, 1945 DOA: 12/20/2014  Referring physician: Fayrene Helper PCP: Thayer Headings, MD  Specialists: none   Chief Complaint: left buttock redness  HPI: Jacqueline Kim is a 69 y.o. female has a past medical history significant for paroxysmal A. fib on chronic anticoagulation, hypertension, hypothyroidism, presents to the emergency room being brought by the family with a chief complaint of left buttock redness worsening for the past 2 days. came to the emergency room 2 days ago with a left buttock abscess, that was I and D in the emergency room and she was discharged home on clindamycin. Over the last couple days, the redness around the abscess has progressively gotten worse, patient had subjective fevers at home, and the family tells me that she has been very weak, very lightheaded and dizzy, and has been more confused starting this morning. Patient denies any chest pain, denies any palpitations, denies any abdominal pain, nausea or vomiting. She has noticed loose stools started this morning, denies any watery diarrhea. She denies any shortness of breath. In the emergency room, patient was found to be hyponatremic with a sodium of 137, she was found to be in renal failure with a creatinine of  2.1 from previously normal baseline, she has mild leukocytosis of 12, her lactic acid was found to be normal, she is afebrile. [facility] was asked for admission for selective that failed outpatient treatment with oral anti-biotics.   Review of Systems: as per HPI otherwise 10 point ROS negative  Past Medical History  Diagnosis Date  . Paroxysmal a-fib diagnosed 2008    a. On propafenone, Pradaxa.  . 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 Hypothyroid   . Anxiety   . Hypertension   . Glaucoma   . Complication of anesthesia     DIFFICULTY WAKING UP  . Arthritis   . Anticoagulant long-term use  WITH PRADAXA  05/27/2011  . DJD (degenerative joint disease)   . GERD (gastroesophageal reflux disease)   . IBS (irritable bowel syndrome)   . Depression   . Diverticulosis   . External hemorrhoids   . Esophagitis 09/17/06  . History of stress test     a. 2008 - normal nuc.   Past Surgical History  Procedure Laterality Date  . Pacemaker insertion  04/08/11    MDT Revo implanted by Dr Rubie Maid  . Eye surgery Bilateral     cataracts  . Vaginal hysterectomy    . Liposuction    . Ovarian cyst removal    . Foot surgery    . Hand surgery      multiple  . Breast reduction surgery    . Facial cosmetic surgery    . Tonsillectomy    . Nm myocar perf wall motion  12/29/2006    No significant ischemia; EF 69%  . Cardioversion N/A 08/02/2014    Procedure: CARDIOVERSION;  Surgeon: Quintella Reichert, MD;  Location: [facility];  Service: Cardiovascular;  Laterality: N/A;   Social History:  reports that she quit smoking about 18 years ago. She has never used smokeless tobacco. She reports that she drinks about 3.6 oz of alcohol per week. She reports that she does not use illicit drugs.  Allergies  Allergen Reactions  . Azithromycin Other (See Comments)    Causes heart rhythm issues  . Epinephrine Other (See Comments)    Causes A-Fib  . Penicillins Anaphylaxis  . Metoprolol  alopecia  . Lidocaine Palpitations    Or any similar medication    Family History  Problem Relation Age of Onset  . Anesthesia problems Mother   . Colon cancer Neg Hx   . Colon polyps Mother     beign    Prior to Admission medications   Medication Sig Start Date End Date Taking? Authorizing Provider  atenolol (TENORMIN) 50 MG tablet Take 50 mg by mouth every morning.   Yes Historical Provider, MD  Biotin 5000 MCG TABS Take 1 tablet by mouth daily.   Yes Historical Provider, MD  celecoxib (CELEBREX) 200 MG capsule Take 200 mg by mouth daily as needed (pain).   Yes Historical Provider, MD  clindamycin (CLEOCIN) 150 MG  capsule Take 2 capsules (300 mg total) by mouth 3 (three) times daily. 12/18/14 12/25/14 Yes Lenell Antu, MD  conjugated estrogens (PREMARIN) vaginal cream Place 1.5 g vaginally 2 (two) times a week. No specific days   Yes Historical Provider, MD  dabigatran (PRADAXA) 150 MG CAPS capsule TAKE ONE CAPSULE BY MOUTH EVERY 12 HOURS   Yes Historical Provider, MD  dicyclomine (BENTYL) 10 MG capsule TAKE ONE CAPSULE BY MOUTH 3 TIMES DAILY BEFORE MEALS   Yes Historical Provider, MD  dicyclomine (BENTYL) 10 MG capsule TAKE ONE CAPSULE 3 TIMES DAILY BEFORE MEALS 10/20/14  Yes Hart Carwin, MD  diltiazem (CARDIZEM CD) 120 MG 24 hr capsule Take 1 capsule (120 mg total) by mouth at bedtime. 08/31/14  Yes [name], MD  docusate sodium (COLACE) 100 MG capsule Take 200 mg by mouth daily.   Yes Historical Provider, MD  escitalopram (LEXAPRO) 20 MG tablet Take 20 mg by mouth daily.    Yes Historical Provider, MD  estrogens, conjugated, (PREMARIN) 0.3 MG tablet Take 0.3 mg by mouth every other day.    Yes Historical Provider, MD  furosemide (LASIX) 20 MG tablet Take 20 mg by mouth daily as needed for fluid.  09/21/13  Yes Historical Provider, MD  Garcinia Cambogia-Chromium 500-200 MG-MCG TABS Take 1 capsule by mouth daily.   Yes Historical Provider, MD  HYDROcodone-acetaminophen (NORCO) 7.5-325 MG per tablet Take 1 tablet by mouth every 6 (six) hours as needed. Moderate pain 09/18/14  Yes Historical Provider, MD  levothyroxine (SYNTHROID, LEVOTHROID) 112 MCG tablet Take 112 mcg by mouth every evening.    Yes Historical Provider, MD  losartan (COZAAR) 50 MG tablet Take 1 tablet (50 mg total) by mouth 2 (two) times daily. 10/13/13  Yes [name], MD  omeprazole (PRILOSEC) 40 MG capsule Take 40 mg by mouth daily as needed (increase in stomach acid).    Yes Historical Provider, MD  propafenone (RYTHMOL SR) 325 MG 12 hr capsule Take 1 capsule (325 mg total) by mouth 2 (two) times daily. 03/17/14  Yes [name], MD    traZODone (DESYREL) 150 MG tablet Take 150 mg by mouth at bedtime.     Yes Historical Provider, MD  zaleplon (SONATA) 10 MG capsule Take 10 mg by mouth at bedtime.    Yes Historical Provider, MD   Physical Exam: Filed Vitals:   12/20/14 1445 12/20/14 1459 12/20/14 1502 12/20/14 1515  BP: 102/48   114/93  Pulse: 59   63  Temp:   97.6 F (36.4 C)   TempSrc:   Oral   Resp: 15   18  Weight:  71.215 kg (157 lb)    SpO2: 95%   96%     General:  No  apparent distress  Eyes: PERRL, EOMI, no scleral icterus  ENT: moist oropharynx  Neck: supple, no lymphadenopathy  Cardiovascular: regular rate without MRG; 2+ peripheral pulses, no JVD, no peripheral edema  Respiratory: CTA biL, good air movement without wheezing, rhonchi or crackled  Abdomen: soft, non tender to palpation, positive bowel sounds, no guarding, no rebound  Skin: no rashes, left buttock cellulitis present, with ~6-7 dm outside of markings done 2 days ago in the ED  Musculoskeletal: normal bulk and tone, no joint swelling  Psychiatric: normal mood and affect  Neurologic: CN 2-12 grossly intact, MS 5/5 in all 4  Labs on Admission:  Basic Metabolic Panel:  Recent Labs Lab 12/20/14 1357  NA 127*  K 3.8  CL 99*  CO2 18*  GLUCOSE 125*  BUN 34*  CREATININE 2.12*  CALCIUM 8.5*   Liver Function Tests:  Recent Labs Lab 12/20/14 1357  AST 25  ALT 14  ALKPHOS 47  BILITOT 0.5  PROT 6.0*  ALBUMIN 3.0*   CBC:  Recent Labs Lab 12/20/14 1357  WBC 12.3*  NEUTROABS 10.4*  HGB 10.7*  HCT 31.7*  MCV 90.3  PLT 157   Radiological Exams on Admission: Dg Chest Port 1 View  12/20/2014   CLINICAL DATA:  Weakness. Hypertension. Symptoms today. Initial encounter.  EXAM: PORTABLE CHEST - 1 VIEW  COMPARISON:  Single view of the chest 08/29/2014. PA and lateral chest 08/01/2014.  FINDINGS: And pacing device is in place, unchanged. The lungs are clear. Heart size is normal. There is no pneumothorax or pleural  effusion. No focal bony abnormality is identified.  IMPRESSION: No acute disease.   Electronically Signed   By: Drusilla Kanner M.D.   On: 12/20/2014 13:52   EKG: Independently reviewed. atrial paced rhythm   Assessment/Plan Active Problems:   Paroxysmal a-fib, recurrent 07/13/12- (on Propafenone)    Hypothyroid   Pacemaker [employer]  (REVO) placed10/23/12   Anticoagulant long-term use  WITH PRADAXA, CHADS Vasc score 3   HTN (hypertension)   Cellulitis of left buttock   Cellulitis   AKI (acute kidney injury)   Hyponatremia    Left buttock cellulitis - Patient failed outpatient treatment with oral clindamycin. We'll start IV vancomycin, she has anaphylaxis to penicillin as, will add metronidazole for anaerobic coverage - Patient is afebrile, has normal lactic acid and non toxic appearing, admit to telemetry   Hypertension - Hold her ARB due to renal failure  Hyponatremia - likely due to dehydration - monitor BMP Q8 x 3 while receiving IVF  Acute renal failure - Likely in the setting of dehydration, hold her losartan - hydration, repeat BMP  Hypothyroidism - resume synthroid  Paroxysmal A fib - paced currently, continue her home medications - CHADS VASC score 3, anticoagulated with Pradaxa, continue - monitor on telemetry    Diet: heart healthy Fluids: NS DVT Prophylaxis: Pradaxa  Code Status: Full  Family Communication: d/w daughter bedside  Disposition Plan: admit to tele  Time spent: 41  Nobuko Gsell M. Elvera Lennox, MD [facility] Pager [phone]  If 7PM-7AM, please contact night-coverage [email or web address] Password [email or web address] 12/20/2014, 3:24 PM

## 2014-12-20 NOTE — Progress Notes (Addendum)
ANTIBIOTIC CONSULT NOTE - INITIAL  Pharmacy Consult for vancomycin Indication: cellulitis  Allergies  Allergen Reactions  . Azithromycin Other (See Comments)    Causes heart rhythm issues  . Epinephrine Other (See Comments)    Causes A-Fib  . Penicillins Anaphylaxis  . Metoprolol     alopecia  . Lidocaine Palpitations    Or any similar medication    Patient Measurements: Wt= 68kg  Vital Signs: Temp: 98.4 F (36.9 C) (07/06 1210) Temp Source: Oral (07/06 1210) BP: 99/56 mmHg (07/06 1252) Pulse Rate: 67 (07/06 1252) Intake/Output from previous day:   Intake/Output from this shift:    Labs: No results for input(s): WBC, HGB, PLT, LABCREA, CREATININE in the last 72 hours. CrCl cannot be calculated (Patient has no serum creatinine result on file.). No results for input(s): VANCOTROUGH, VANCOPEAK, VANCORANDOM, GENTTROUGH, GENTPEAK, GENTRANDOM, TOBRATROUGH, TOBRAPEAK, TOBRARND, AMIKACINPEAK, AMIKACINTROU, AMIKACIN in the last 72 hours.   Microbiology: No results found for this or any previous visit (from the past 720 hour(s)).  Medical History: Past Medical History  Diagnosis Date  . Paroxysmal a-fib diagnosed 2008    a. On propafenone, Pradaxa.  . 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 Hypothyroid   . Anxiety   . Hypertension   . Glaucoma   . Complication of anesthesia     DIFFICULTY WAKING UP  . Arthritis   . Anticoagulant long-term use  WITH PRADAXA 05/27/2011  . DJD (degenerative joint disease)   . GERD (gastroesophageal reflux disease)   . IBS (irritable bowel syndrome)   . Depression   . Diverticulosis   . External hemorrhoids   . Esophagitis 09/17/06  . History of stress test     a. 2008 - normal nuc.    Assessment: 69 yo female with cellulitis and also noted with left gluteal abscess (recent I&D). Pharmacy has been consulted to dose vancomycin.  Last SCr was 1.06 on 08/29/14 (normalized CrCl ~55)  7/6 vancomycin    7/6 urine 7/6 blood x2   Goal of Therapy:  Vancomycin trough level 10-15 mcg/ml  Plan:  -Vancomycin 1250mg  IV q24hr -Will follow renal function, cultures and clinical progress  , Pharm D 12/20/2014 1:37 PM

## 2014-12-20 NOTE — Progress Notes (Signed)
Pt .9% sodium chloride . rate at 4mL/hour when nurse received pt in report. Previous RN reported rate to be at 10cc/hour. Upon chart check RN discovered Rate should have been 148mL/hour. RN hung new bag at 2211  Will continue to monitor.   Edgardo Roys

## 2014-12-20 NOTE — Telephone Encounter (Signed)
Have you spoken to this pt yet about her meds?

## 2014-12-20 NOTE — ED Notes (Signed)
Phlebotomy still trying to obtain blood cultures x2

## 2014-12-21 DIAGNOSIS — L03317 Cellulitis of buttock: Principal | ICD-10-CM

## 2014-12-21 DIAGNOSIS — N179 Acute kidney failure, unspecified: Secondary | ICD-10-CM

## 2014-12-21 DIAGNOSIS — I480 Paroxysmal atrial fibrillation: Secondary | ICD-10-CM

## 2014-12-21 DIAGNOSIS — E871 Hypo-osmolality and hyponatremia: Secondary | ICD-10-CM

## 2014-12-21 DIAGNOSIS — Z7901 Long term (current) use of anticoagulants: Secondary | ICD-10-CM

## 2014-12-21 LAB — COMPREHENSIVE METABOLIC PANEL
ALT: 13 U/L — AB (ref 14–54)
AST: 19 U/L (ref 15–41)
Albumin: 2.3 g/dL — ABNORMAL LOW (ref 3.5–5.0)
Alkaline Phosphatase: 37 U/L — ABNORMAL LOW (ref 38–126)
Anion gap: 5 (ref 5–15)
BUN: 23 mg/dL — ABNORMAL HIGH (ref 6–20)
CALCIUM: 7.7 mg/dL — AB (ref 8.9–10.3)
CO2: 20 mmol/L — ABNORMAL LOW (ref 22–32)
Chloride: 108 mmol/L (ref 101–111)
Creatinine, Ser: 1.45 mg/dL — ABNORMAL HIGH (ref 0.44–1.00)
GFR calc Af Amer: 41 mL/min — ABNORMAL LOW (ref >60)
GFR calc non Af Amer: 35 mL/min — ABNORMAL LOW (ref >60)
Glucose, Bld: 110 mg/dL — ABNORMAL HIGH (ref 65–99)
Potassium: 3.7 mmol/L (ref 3.5–5.1)
SODIUM: 133 mmol/L — AB (ref 135–145)
TOTAL PROTEIN: 4.8 g/dL — AB (ref 6.5–8.1)
Total Bilirubin: 0.4 mg/dL (ref 0.3–1.2)

## 2014-12-21 LAB — CBC
HCT: 27.4 % — ABNORMAL LOW (ref 36.0–46.0)
Hemoglobin: 9 g/dL — ABNORMAL LOW (ref 12.0–15.0)
MCH: 30.5 pg (ref 26.0–34.0)
MCHC: 32.8 g/dL (ref 30.0–36.0)
MCV: 92.9 fL (ref 78.0–100.0)
Platelets: 196 10*3/uL (ref 150–400)
RBC: 2.95 MIL/uL — ABNORMAL LOW (ref 3.87–5.11)
RDW: 13.9 % (ref 11.5–15.5)
WBC: 8.1 10*3/uL (ref 4.0–10.5)

## 2014-12-21 NOTE — Progress Notes (Signed)
[facility] PROGRESS NOTE  Assessment/Plan: Cellulitis of left buttock: - Started empirically on vancomycin and Flagyl. Has had no further fever. - Likely acid was not elevated on admission.  AKI (acute kidney injury) - Baseline creatinine 1, on admission 1.6 most likely prerenal improving with IV hydration. - Continue to hold ACE inhibitor and diuretics.  Hyponatremia: - Likely prerenal continue IV hydration.  Essential HTN (hypertension) Pressure stable continue to hold antihypertensive medications.  Anticoagulant long-term use  WITH PRADAXA, CHADS Vasc score 3/  Paroxysmal a-fib, recurrent 07/13/12- (on Propafenone) : Continue per Pradaxa.    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  Objective: Filed Vitals:   12/20/14 1515 12/20/14 1554 12/20/14 2013 12/21/14 0400  BP: 114/93 101/51 104/49 99/55  Pulse: 63 63 59 63  Temp:  98.2 F (36.8 C) 99.6 F (37.6 C) 98.2 F (36.8 C)  TempSrc:  Oral Oral Oral  Resp: 18 20 19 21   Weight:      SpO2: 96% 98% 95% 96%    Intake/Output Summary (Last 24 hours) at 12/21/14 0824 Last data filed at 12/21/14 0145  Gross per 24 hour  Intake   1000 ml  Output    750 ml  Net    250 ml   Filed Weights   12/20/14 1459  Weight: 71.215 kg (157 lb)    Exam:  General: Alert, awake, oriented x3, in no acute distress.  HEENT: No bruits, no goiter.  Heart: Regular rate and rhythm. Lungs: Good air movement, clear Abdomen: Soft, nontender, nondistended, positive bowel sounds.  Neuro: Grossly intact, nonfocal.   Data Reviewed: Basic Metabolic Panel:  Recent Labs Lab 12/20/14 1357 12/20/14 2155 12/21/14 0300  NA 127* 132* 133*  K 3.8 3.7 3.7   CL 99* 105 108  CO2 18* 20* 20*  GLUCOSE 125* 134* 110*  BUN 34* 26* 23*  CREATININE 2.12* 1.63* 1.45*  CALCIUM 8.5* 8.2* 7.7*   Liver Function Tests:  Recent Labs Lab 12/20/14 1357 12/21/14 0300  AST 25 19  ALT 14 13*  ALKPHOS 47 37*  BILITOT 0.5 0.4  PROT 6.0* 4.8*  ALBUMIN 3.0* 2.3*   No results for input(s): LIPASE, AMYLASE in the last 168 hours. No results for input(s): AMMONIA in the last 168 hours. CBC:  Recent Labs Lab 12/20/14 1357  WBC 12.3*  NEUTROABS 10.4*  HGB 10.7*  HCT 31.7*  MCV 90.3  PLT 157   Cardiac Enzymes: No results for input(s): CKTOTAL, CKMB, CKMBINDEX, TROPONINI in the last 168 hours. BNP (last 3 results) No results for input(s): BNP in the last 8760 hours.  ProBNP (last 3 results) No results for input(s): PROBNP in the last 8760 hours.  CBG: No results for input(s): GLUCAP in the last 168 hours.  Recent Results (from the past 240 hour(s))  Blood Culture (routine x 2)     Status: None (Preliminary result)   Collection Time: 12/20/14  1:50 PM  Result Value Ref Range Status   Specimen Description BLOOD LEFT FOREARM  Final   Special Requests BOTTLES DRAWN AEROBIC AND ANAEROBIC 2.5CC  Final   Culture PENDING  Incomplete   Report Status PENDING  Incomplete     Studies: Dg Chest Port 1 View  12/20/2014  CLINICAL DATA:  Weakness. Hypertension. Symptoms today. Initial encounter.  EXAM: PORTABLE CHEST - 1 VIEW  COMPARISON:  Single view of the chest 08/29/2014. PA and lateral chest 08/01/2014.  FINDINGS: And pacing device is in place, unchanged. The lungs are clear. Heart size is normal. There is no pneumothorax or pleural effusion. No focal bony abnormality is identified.  IMPRESSION: No acute disease.   Electronically Signed   By: Drusilla Kanner M.D.   On: 12/20/2014 13:52    Scheduled Meds: . atenolol  50 mg Oral q morning - 10a  . dabigatran  75 mg Oral Q12H  . diltiazem  120 mg Oral QHS  . escitalopram  20 mg Oral Daily  .  estrogens (conjugated)  0.3 mg Oral QODAY  . levothyroxine  112 mcg Oral QPM  . metronidazole  500 mg Intravenous Q8H  . pantoprazole  40 mg Oral Daily  . propafenone  325 mg Oral BID  . sodium chloride  3 mL Intravenous Q12H  . traZODone  150 mg Oral QHS  . vancomycin  1,250 mg Intravenous Q24H   Continuous Infusions: . sodium chloride 1,000 mL (12/20/14 2211)    Time Spent: 25 min   Marinda Elk  [facility] Pager [phone]. If 7PM-7AM, please contact night-coverage at [email or web address], password [facility] 12/21/2014, 8:24 AM  LOS: 1 day

## 2014-12-21 NOTE — Progress Notes (Signed)
Utilization review completed.  

## 2014-12-22 DIAGNOSIS — I10 Essential (primary) hypertension: Secondary | ICD-10-CM

## 2014-12-22 LAB — URINE CULTURE

## 2014-12-22 MED ORDER — LOSARTAN POTASSIUM 50 MG PO TABS: 50.0000 mg | ORAL_TABLET | Freq: Two times a day (BID) | ORAL | Status: DC

## 2014-12-22 MED ORDER — FUROSEMIDE 20 MG PO TABS: 20.0000 mg | ORAL_TABLET | Freq: Every day | ORAL | Status: DC | PRN

## 2014-12-22 MED ORDER — ATENOLOL 50 MG PO TABS: 50.0000 mg | ORAL_TABLET | Freq: Every morning | ORAL | Status: DC

## 2014-12-22 MED ORDER — SULFAMETHOXAZOLE-TRIMETHOPRIM 800-160 MG PO TABS: 1.0000 | ORAL_TABLET | Freq: Two times a day (BID) | ORAL | Status: DC

## 2014-12-22 MED ORDER — DABIGATRAN ETEXILATE MESYLATE 150 MG PO CAPS: 150.0000 mg | ORAL_CAPSULE | Freq: Two times a day (BID) | ORAL | Status: DC

## 2014-12-22 MED ORDER — DABIGATRAN ETEXILATE MESYLATE 150 MG PO CAPS
150.0000 mg | ORAL_CAPSULE | Freq: Two times a day (BID) | ORAL | Status: DC
  Administered 2014-12-22: 150 mg via ORAL
  Filled 2014-12-22 (×2): qty 1

## 2014-12-22 MED ORDER — VANCOMYCIN HCL 10 G IV SOLR
1250.0000 mg | INTRAVENOUS | Status: DC
  Administered 2014-12-22: 1250 mg via INTRAVENOUS
  Filled 2014-12-22: qty 1250

## 2014-12-22 MED ORDER — DOXYCYCLINE HYCLATE 100 MG PO TABS: 100.0000 mg | ORAL_TABLET | Freq: Two times a day (BID) | ORAL | Status: DC

## 2014-12-22 NOTE — Progress Notes (Signed)
12/22/2014 6:13 PM Discharge AVS meds taken today and those due this evening reviewed.  Follow-up appointments and when to call md reviewed.  D/C IV and TELE.  Questions and concerns addressed.   D/C home per orders. Kathryne Hitch

## 2014-12-22 NOTE — Care Management Note (Signed)
Case Management Note  Patient Details  Name: Jacqueline Kim MRN: [id number] Date of Birth: 1945-08-21  Subjective/Objective:  Pt admitted with cellulitis s/p I&D of buttock abscess                   Action/Plan: PTA pt lived at home alone-   Expected Discharge Date:        12/22/14          Expected Discharge Plan:  Home w Home Health Services  In-House Referral:     Discharge planning Services  CM Consult  Post Acute Care Choice:  Home Health Choice offered to:  Patient  DME Arranged:    DME Agency:     HH Arranged:  RN HH Agency:  [facility]  Status of Service:  Completed, signed off  Medicare Important Message Given:  Yes-second notification given Date Medicare IM Given:   12/22/14 Medicare IM give by:   Donn Pierini RN, BSN  Date Additional Medicare IM Given:    Additional Medicare Important Message give by:     If discussed at Long Length of Stay Meetings, dates discussed:    Additional Comments:  Wynelle Beckmann- daughter- [phone]  12/22/14- spoke with pt at bedside regarding d/c needs- per pt she has difficulty affording her Pradaxa she states that she changed her insurance and that Pradaxa is no longer covered- she reports that she has placed a call to her cardiologist office to see about changing med but has not heard back-- wants to know what assistance she might can get- also reports that she is under SHIP already and gets meds at reduced cost- pt also open to HH-RN for wound care- Received call from pt's daughter regarding concerns about meds- per that conversation learned that pt actually has lost her Part D coverage and no longer has medication coverage under her insurance- she only has [employer] discount plan through [employer]- and gets meds through mail order- daughter's main concern is that her mom is without Atenolol and Pradaxa-  Spoke with attending- who is agreeable to write for 30 day scripts for both Atenolol and Pradaxa- Atenolol is on $4 list and  will plan to give 30 day free card for Pradaxa - this will give pt and daughter time to get with pt's cardiologist and get meds switched out under the new mail order plan so that pt is on something other than Pradaxa that is covered under the discount program and that she has a 90 day script for Atenolol. MD also wrote for HH-RN - choice offered for [facility]- per pt choice she would like to use [facility]- referral called to Otelia Santee with [facility] for HH-RN-    Darrold Span, RN- [phone] 12/22/2014, 3:18 PM

## 2014-12-22 NOTE — Care Management (Signed)
Important Message  Patient Details  Name: Jacqueline Kim MRN: [id number] Date of Birth: [birth year]   Medicare Important Message Given:  Yes-second notification given    Darrold Span, RN 12/22/2014, 12:41 PM

## 2014-12-22 NOTE — Discharge Instructions (Signed)
Information on my medicine - Pradaxa (dabigatran)  This medication education was reviewed with me or my healthcare representative as part of my discharge preparation.  The pharmacist that spoke with me during my hospital stay was:  Armandina Stammer, [facility]  Why was Pradaxa prescribed for you? Pradaxa was prescribed for you to reduce the risk of forming blood clots that cause a stroke if you have a medical condition called atrial fibrillation (a type of irregular heartbeat).    What do you Need to know about PradAXa? Take your Pradaxa TWICE DAILY - one capsule in the morning and one tablet in the evening with or without food.  It would be best to take the doses about the same time each day.  The capsules should not be broken, chewed or opened - they must be swallowed whole.  Do not store Pradaxa in other medication containers - once the bottle is opened the Pradaxa should be used within FOUR months; throw away any capsules that havent been by that time.  Take Pradaxa exactly as prescribed by your doctor.  DO NOT stop taking Pradaxa without talking to the doctor who prescribed the medication.  Stopping without other stroke prevention medication to take the place of Pradaxa may increase your risk of developing a clot that causes a stroke.  Refill your prescription before you run out.  After discharge, you should have regular check-up appointments with your healthcare provider that is prescribing your Pradaxa.  In the future your dose may need to be changed if your kidney function or weight changes by a significant amount.  What do you do if you miss a dose? If you miss a dose, take it as soon as you remember on the same day.  If your next dose is less than 6 hours away, skip the missed dose.  Do not take two doses of PRADAXA at the same time.  Important Safety Information A possible side effect of Pradaxa is bleeding. You should call your healthcare provider right away if you experience  any of the following: ? Bleeding from an injury or your nose that does not stop. ? Unusual colored urine (red or dark brown) or unusual colored stools (red or black). ? Unusual bruising for unknown reasons. ? A serious fall or if you hit your head (even if there is no bleeding).  Some medicines may interact with Pradaxa and might increase your risk of bleeding or clotting while on Pradaxa. To help avoid this, consult your healthcare provider or pharmacist prior to using any new prescription or non-prescription medications, including herbals, vitamins, non-steroidal anti-inflammatory drugs (NSAIDs) and supplements.  This website has more information on Pradaxa (dabigatran): [email or web address]://[email or web address].com

## 2014-12-22 NOTE — Progress Notes (Signed)
ANTIBIOTIC CONSULT NOTE - FOLLOW UP  Pharmacy Consult for vancomycin Indication: cellulitis  Allergies  Allergen Reactions  . Azithromycin Other (See Comments)    Causes heart rhythm issues  . Epinephrine Other (See Comments)    Causes A-Fib  . Penicillins Anaphylaxis  . Metoprolol     alopecia  . Lidocaine Palpitations    Or any similar medication    Patient Measurements: Weight: 157 lb (71.215 kg)  Vital Signs: Temp: 97.6 F (36.4 C) (07/08 0612) Temp Source: Oral (07/08 0612) BP: 138/82 mmHg (07/08 1030) Pulse Rate: 66 (07/08 0612) Intake/Output from previous day: 07/07 0701 - 07/08 0700 In: 240 [P.O.:240] Out: 750 [Urine:750] Intake/Output from this shift: Total I/O In: 240 [P.O.:240] Out: -   Labs:  Recent Labs  12/20/14 1357 12/20/14 2155 12/21/14 0300 12/21/14 0950  WBC 12.3*  --   --  8.1  HGB 10.7*  --   --  9.0*  PLT 157  --   --  196  CREATININE 2.12* 1.63* 1.45*  --    Estimated Creatinine Clearance: 36 mL/min (by C-G formula based on Cr of 1.45). No results for input(s): VANCOTROUGH, VANCOPEAK, VANCORANDOM, GENTTROUGH, GENTPEAK, GENTRANDOM, TOBRATROUGH, TOBRAPEAK, TOBRARND, AMIKACINPEAK, AMIKACINTROU, AMIKACIN in the last 72 hours.   Microbiology: Recent Results (from the past 720 hour(s))  Blood Culture (routine x 2)     Status: None (Preliminary result)   Collection Time: 12/20/14  1:50 PM  Result Value Ref Range Status   Specimen Description BLOOD LEFT FOREARM  Final   Special Requests BOTTLES DRAWN AEROBIC AND ANAEROBIC 2.5CC  Final   Culture NO GROWTH < 24 HOURS  Final   Report Status PENDING  Incomplete  Blood Culture (routine x 2)     Status: None (Preliminary result)   Collection Time: 12/20/14  1:57 PM  Result Value Ref Range Status   Specimen Description BLOOD HAND LEFT  Final   Special Requests BOTTLES DRAWN AEROBIC ONLY 2CC  Final   Culture NO GROWTH < 24 HOURS  Final   Report Status PENDING  Incomplete  Urine culture      Status: None   Collection Time: 12/20/14  2:59 PM  Result Value Ref Range Status   Specimen Description URINE, RANDOM  Final   Special Requests NONE  Final   Culture >=100,000 COLONIES/mL ESCHERICHIA COLI  Final   Report Status 12/22/2014 FINAL  Final   Organism ID, Bacteria ESCHERICHIA COLI  Final      Susceptibility   Escherichia coli - MIC*    AMPICILLIN >=32 RESISTANT Resistant     CEFAZOLIN <=4 SENSITIVE Sensitive     CEFTRIAXONE <=1 SENSITIVE Sensitive     CIPROFLOXACIN <=0.25 SENSITIVE Sensitive     GENTAMICIN <=1 SENSITIVE Sensitive     IMIPENEM <=0.25 SENSITIVE Sensitive     NITROFURANTOIN <=16 SENSITIVE Sensitive     TRIMETH/SULFA <=20 SENSITIVE Sensitive     AMPICILLIN/SULBACTAM 16 INTERMEDIATE Intermediate     PIP/TAZO <=4 SENSITIVE Sensitive     * >=100,000 COLONIES/mL ESCHERICHIA COLI    Anti-infectives    Start     Dose/Rate Route Frequency Ordered Stop   12/22/14 1500  vancomycin (VANCOCIN) 1,250 mg in sodium chloride 0.9 % 250 mL IVPB     1,250 mg 166.7 mL/hr over 90 Minutes Intravenous Every 24 hours 12/22/14 1157     12/22/14 1000  doxycycline (VIBRA-TABS) tablet 100 mg  Status:  Discontinued     100 mg Oral Every 12 hours 12/22/14 0928  12/22/14 0929   12/22/14 0000  sulfamethoxazole-trimethoprim (BACTRIM DS,SEPTRA DS) 800-160 MG per tablet     1 tablet Oral 2 times daily 12/22/14 0929     12/21/14 1400  vancomycin (VANCOCIN) 1,250 mg in sodium chloride 0.9 % 250 mL IVPB  Status:  Discontinued     1,250 mg 166.7 mL/hr over 90 Minutes Intravenous Every 24 hours 12/20/14 1338 12/22/14 0928   12/20/14 1530  metroNIDAZOLE (FLAGYL) IVPB 500 mg     500 mg 100 mL/hr over 60 Minutes Intravenous Every 8 hours 12/20/14 1518     12/20/14 1515  metroNIDAZOLE (FLAGYL) IVPB 500 mg  Status:  Discontinued     500 mg 100 mL/hr over 60 Minutes Intravenous  Once 12/20/14 1500 12/20/14 1544   12/20/14 1330  vancomycin (VANCOCIN) 1,250 mg in sodium chloride 0.9 % 250 mL IVPB      1,250 mg 166.7 mL/hr over 90 Minutes Intravenous  Once 12/20/14 1320 12/20/14 1554      Assessment: 69 yo female with cellulitis and also noted with left gluteal abscess (recent I&D). Pharmacy has been consulted to dose vancomycin. Now Day#3 of abx for cellulitis. Failed outpatient treatment with clindamycin. Afebrile, WBC down to wnl. Plan is to give one more day of IV abx today and then discharge home on Bactrim. Urine cx also grew out E. coli sensitive to Bactrim. SCr improved to 1.45, CrCl ~29ml/min  Goal of Therapy:  Vancomycin trough level 10-15 mcg/ml  Resolution of infection  Plan:  Continue vancomycin 1250mg  IV Q24 Monitor clinical picture, renal function, CBC, s/s of bleed F/U C&S  Trashaun Streight J 12/22/2014,12:13 PM

## 2014-12-22 NOTE — Discharge Summary (Signed)
Physician Discharge Summary  Jacqueline Kim YWV:[id number] DOB: [birth year] DOA: 12/20/2014  PCP: Thayer Headings, MD  Admit date: 12/20/2014 Discharge date: 12/22/2014  Time spent: 35 minutes  Recommendations for Outpatient Follow-up:  1. Follow with primary care doctor as an outpatient.  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 female has a past medical history significant for paroxysmal A. fib on chronic anticoagulation, hypertension, hypothyroidism, presents to the emergency room being brought by the family with a chief complaint of left buttock redness worsening for the past 2 days. came to the emergency room 2 days ago with a left buttock abscess, that was I and D in the emergency room and she was discharged home on clindamycin. Over the last couple days, the redness around the abscess has progressively gotten worse, patient had subjective fevers at home, and the family tells me that she has been very weak, very lightheaded and dizzy, and has been more confused starting this morning. Patient denies any chest pain, denies any palpitations, denies any abdominal pain, nausea or vomiting. She has noticed loose stools started this morning, denies any watery diarrhea. She denies any shortness of breath. In the emergency room, patient was found to be hyponatremic with a sodium of 137, she was found to be in renal failure with a creatinine of 2.1 from previously normal baseline, she has mild leukocytosis of 12, her lactic acid was found to be normal, she  is afebrile. [facility] was asked for admission for selective that failed outpatient treatment with oral anti-biotics.    Hospital Course:  Cellulitis of left buttock: - Started empirically on vancomycin and Flagyl. - By the next day where edema was much improved so she was changed to Bactrim and she will continue Bactrim at home for 7 additional days.  AKI (acute kidney injury) - Baseline creatinine 1, on admission 1.6 most likely prerenal improving with IV hydration. - She will resume her ACE inhibitor and her diuretic 2 days after discharge.  Hyponatremia: - Likely prerenal, resolved with hydration.  Essential HTN (hypertension) Pressure stable continue to hold antihypertensive medications.  Anticoagulant long-term use WITH PRADAXA, CHADS Vasc score 3/ Paroxysmal a-fib, recurrent 07/13/12- (on Propafenone) : Continue per Pradaxa.   Hypothyroid   Procedures:  None  Consultations:  None  Discharge Exam: Filed Vitals:   12/22/14 0612  BP: 110/58  Pulse: 66  Temp: 97.6 F (36.4 C)  Resp: 18    General: Awake alert and oriented 3 Cardiovascular: Regular rate and rhythm Respiratory: Good air movement clear to auscultation  Discharge Instructions    Current Discharge Medication List    START taking these medications   Details  sulfamethoxazole-trimethoprim (BACTRIM DS,SEPTRA DS) 800-160 MG per tablet Take 1 tablet by mouth 2 (two) times daily. Qty: 14 tablet, Refills: 0      CONTINUE these medications which have CHANGED   Details  furosemide (LASIX) 20 MG tablet Take 1 tablet (20 mg total) by mouth daily as needed for fluid. Qty: 30 tablet    losartan (COZAAR)  50 MG tablet Take 1 tablet (50 mg total) by mouth 2 (two) times daily. Qty: 180 tablet, Refills: 3      CONTINUE these medications which have NOT CHANGED   Details  atenolol (TENORMIN) 50 MG tablet Take 50 mg by mouth every morning.    Biotin 5000 MCG TABS Take 1 tablet by mouth daily.     celecoxib (CELEBREX) 200 MG capsule Take 200 mg by mouth daily as needed (pain).    conjugated estrogens (PREMARIN) vaginal cream Place 1.5 g vaginally 2 (two) times a week. No specific days    dabigatran (PRADAXA) 150 MG CAPS capsule TAKE ONE CAPSULE BY MOUTH EVERY 12 HOURS    !! dicyclomine (BENTYL) 10 MG capsule TAKE ONE CAPSULE BY MOUTH 3 TIMES DAILY BEFORE MEALS    !! dicyclomine (BENTYL) 10 MG capsule TAKE ONE CAPSULE 3 TIMES DAILY BEFORE MEALS Qty: 90 capsule, Refills: 2    diltiazem (CARDIZEM CD) 120 MG 24 hr capsule Take 1 capsule (120 mg total) by mouth at bedtime. Qty: 30 capsule, Refills: 6    docusate sodium (COLACE) 100 MG capsule Take 200 mg by mouth daily.    escitalopram (LEXAPRO) 20 MG tablet Take 20 mg by mouth daily.     estrogens, conjugated, (PREMARIN) 0.3 MG tablet Take 0.3 mg by mouth every other day.     Garcinia Cambogia-Chromium 500-200 MG-MCG TABS Take 1 capsule by mouth daily.    HYDROcodone-acetaminophen (NORCO) 7.5-325 MG per tablet Take 1 tablet by mouth every 6 (six) hours as needed. Moderate pain    levothyroxine (SYNTHROID, LEVOTHROID) 112 MCG tablet Take 112 mcg by mouth every evening.     omeprazole (PRILOSEC) 40 MG capsule Take 40 mg by mouth daily as needed (increase in stomach acid).     propafenone (RYTHMOL SR) 325 MG 12 hr capsule Take 1 capsule (325 mg total) by mouth 2 (two) times daily. Qty: 180 capsule, Refills: 2    traZODone (DESYREL) 150 MG tablet Take 150 mg by mouth at bedtime.      zaleplon (SONATA) 10 MG capsule Take 10 mg by mouth at bedtime.      !! - Potential duplicate medications found. Please discuss with provider.    STOP taking these medications     clindamycin (CLEOCIN) 150 MG capsule        Allergies  Allergen Reactions  . Azithromycin Other (See Comments)    Causes heart rhythm issues  . Epinephrine Other (See Comments)    Causes A-Fib  . Penicillins Anaphylaxis  . Metoprolol     alopecia  .  Lidocaine Palpitations    Or any similar medication      The results of significant diagnostics from this hospitalization (including imaging, microbiology, ancillary and laboratory) are listed below for reference.    Significant Diagnostic Studies: Dg Chest Port 1 View  12/20/2014   CLINICAL DATA:  Weakness. Hypertension. Symptoms today. Initial encounter.  EXAM: PORTABLE CHEST - 1 VIEW  COMPARISON:  Single view of the chest 08/29/2014. PA and lateral chest 08/01/2014.  FINDINGS: And pacing device is in place, unchanged. The lungs are clear. Heart size is normal. There is no pneumothorax or pleural effusion. No focal bony abnormality is identified.  IMPRESSION: No acute disease.   Electronically Signed   By: Drusilla Kanner M.D.   On: 12/20/2014 13:52    Microbiology: Recent Results (from the past 240 hour(s))  Blood Culture (routine x 2)     Status: None (Preliminary result)  Collection Time: 12/20/14  1:50 PM  Result Value Ref Range Status   Specimen Description BLOOD LEFT FOREARM  Final   Special Requests BOTTLES DRAWN AEROBIC AND ANAEROBIC 2.5CC  Final   Culture NO GROWTH < 24 HOURS  Final   Report Status PENDING  Incomplete  Blood Culture (routine x 2)     Status: None (Preliminary result)   Collection Time: 12/20/14  1:57 PM  Result Value Ref Range Status   Specimen Description BLOOD HAND LEFT  Final   Special Requests BOTTLES DRAWN AEROBIC ONLY 2CC  Final   Culture NO GROWTH < 24 HOURS  Final   Report Status PENDING  Incomplete  Urine culture     Status: None   Collection Time: 12/20/14  2:59 PM  Result Value Ref Range Status   Specimen Description URINE, RANDOM  Final   Special Requests NONE  Final   Culture >=100,000 COLONIES/mL ESCHERICHIA COLI  Final   Report Status 12/22/2014 FINAL  Final   Organism ID, Bacteria ESCHERICHIA COLI  Final      Susceptibility   Escherichia coli - MIC*    AMPICILLIN >=32 RESISTANT Resistant     CEFAZOLIN <=4 SENSITIVE Sensitive      CEFTRIAXONE <=1 SENSITIVE Sensitive     CIPROFLOXACIN <=0.25 SENSITIVE Sensitive     GENTAMICIN <=1 SENSITIVE Sensitive     IMIPENEM <=0.25 SENSITIVE Sensitive     NITROFURANTOIN <=16 SENSITIVE Sensitive     TRIMETH/SULFA <=20 SENSITIVE Sensitive     AMPICILLIN/SULBACTAM 16 INTERMEDIATE Intermediate     PIP/TAZO <=4 SENSITIVE Sensitive     * >=100,000 COLONIES/mL ESCHERICHIA COLI     Labs: Basic Metabolic Panel:  Recent Labs Lab 12/20/14 1357 12/20/14 2155 12/21/14 0300  NA 127* 132* 133*  K 3.8 3.7 3.7  CL 99* 105 108  CO2 18* 20* 20*  GLUCOSE 125* 134* 110*  BUN 34* 26* 23*  CREATININE 2.12* 1.63* 1.45*  CALCIUM 8.5* 8.2* 7.7*   Liver Function Tests:  Recent Labs Lab 12/20/14 1357 12/21/14 0300  AST 25 19  ALT 14 13*  ALKPHOS 47 37*  BILITOT 0.5 0.4  PROT 6.0* 4.8*  ALBUMIN 3.0* 2.3*   No results for input(s): LIPASE, AMYLASE in the last 168 hours. No results for input(s): AMMONIA in the last 168 hours. CBC:  Recent Labs Lab 12/20/14 1357 12/21/14 0950  WBC 12.3* 8.1  NEUTROABS 10.4*  --   HGB 10.7* 9.0*  HCT 31.7* 27.4*  MCV 90.3 92.9  PLT 157 196   Cardiac Enzymes: No results for input(s): CKTOTAL, CKMB, CKMBINDEX, TROPONINI in the last 168 hours. BNP: BNP (last 3 results) No results for input(s): BNP in the last 8760 hours.  ProBNP (last 3 results) No results for input(s): PROBNP in the last 8760 hours.  CBG: No results for input(s): GLUCAP in the last 168 hours.     Signed:  Marinda Elk  [facility] 12/22/2014, 9:39 AM

## 2014-12-25 LAB — CULTURE, BLOOD (ROUTINE X 2)
CULTURE: NO GROWTH
Culture: NO GROWTH

## 2014-12-25 NOTE — Telephone Encounter (Signed)
Discussed wKristen; plan put forward.

## 2014-12-25 NOTE — Telephone Encounter (Signed)
Spoke to patient about Pradaxa.  She has another 30 day supply, but after than will have none and has no prescription insurance.  Advised to her that we can switch to [facility], which has a $4 copay card for uninsured persons.  Pt willing to make the switch.  She has been recently hospitalized and her SCr was elevated.  Have asked that she call when has 1 week of Pradaxa left, we can then repeat labs to determine her dose of Savaysa.  Pt voiced understanding.  Pt also concerned about her propafenone.  She apparently takes 325 mg bid, but would like to switch to 300mg  for cost savings.  Will forward to Wanda/Dr. to follow up

## 2014-12-26 NOTE — Telephone Encounter (Signed)
Reviewed with Dr. [name].  Can offer her the 150mg  tablet with directions of 1.5 tablets (225 mg) morning and evening, 1 tablet (150 mg) mid-day.  Will have contact patient.  She needs to know that the standard propafenone needs to be dosed every 8 hours, whereas then 325 mg (current prescription) is a 12 hour formulation.

## 2014-12-27 ENCOUNTER — Other Ambulatory Visit: Payer: Self-pay | Admitting: *Deleted

## 2014-12-27 ENCOUNTER — Telehealth: Payer: Self-pay | Admitting: *Deleted

## 2014-12-27 MED ORDER — PROPAFENONE HCL 150 MG PO TABS: ORAL_TABLET | ORAL | Status: DC

## 2014-12-27 MED ORDER — ATENOLOL 50 MG PO TABS: 50.0000 mg | ORAL_TABLET | Freq: Every morning | ORAL | Status: DC

## 2014-12-27 NOTE — Telephone Encounter (Signed)
Called patient to inform her of  propafenone instructions per Dr. [name]. She states that over the weekend she was out of medication and she was in a-fib therefore she has her ex-husband Dr. Dione Booze to give her a prescription for the propafenone 325 mg tablets which  cost $ 127.00. She tells me that she wants to think about the alternate way that Dr. [name] and Baxter Hire has formulated  for her to take  to equal out to her 325 mg dose. She will call back before running out of medication to let us know what she decides to do.

## 2014-12-27 NOTE — Telephone Encounter (Signed)
Received a call from patient stating that if Dr. [name] wants her to try the propafenone the way that he and Belenda Cruise had decided then she would do it. Requested for me to send the prescription to her pharmacy. A 90 day supply has been sent to [facility] pharmacy. She also asked that I refill her atenolol 50 mg.

## 2014-12-28 NOTE — Telephone Encounter (Signed)
acknowledged

## 2015-01-02 ENCOUNTER — Other Ambulatory Visit: Payer: Self-pay | Admitting: Cardiovascular Disease

## 2015-01-02 NOTE — Telephone Encounter (Signed)
Rx(s) sent to pharmacy electronically.  

## 2015-01-04 ENCOUNTER — Other Ambulatory Visit: Payer: Self-pay | Admitting: *Deleted

## 2015-01-04 MED ORDER — LOSARTAN POTASSIUM 50 MG PO TABS: 50.0000 mg | ORAL_TABLET | Freq: Two times a day (BID) | ORAL | Status: DC

## 2015-01-04 NOTE — Telephone Encounter (Signed)
Rx(s) sent to pharmacy electronically.  

## 2015-01-18 ENCOUNTER — Ambulatory Visit (INDEPENDENT_AMBULATORY_CARE_PROVIDER_SITE_OTHER): Payer: Medicare Other | Admitting: Cardiovascular Disease

## 2015-01-18 ENCOUNTER — Encounter: Payer: Self-pay | Admitting: Cardiovascular Disease

## 2015-01-18 VITALS — BP 118/76 | HR 66 | Ht 66.0 in | Wt 150.8 lb

## 2015-01-18 DIAGNOSIS — Z7901 Long term (current) use of anticoagulants: Secondary | ICD-10-CM | POA: Diagnosis not present

## 2015-01-18 DIAGNOSIS — I480 Paroxysmal atrial fibrillation: Secondary | ICD-10-CM | POA: Diagnosis not present

## 2015-01-18 DIAGNOSIS — Z950 Presence of cardiac pacemaker: Secondary | ICD-10-CM | POA: Diagnosis not present

## 2015-01-18 DIAGNOSIS — I10 Essential (primary) hypertension: Secondary | ICD-10-CM

## 2015-01-18 DIAGNOSIS — I495 Sick sinus syndrome: Secondary | ICD-10-CM

## 2015-01-18 DIAGNOSIS — E039 Hypothyroidism, unspecified: Secondary | ICD-10-CM

## 2015-01-18 MED ORDER — DABIGATRAN ETEXILATE MESYLATE 150 MG PO CAPS: 150.0000 mg | ORAL_CAPSULE | Freq: Two times a day (BID) | ORAL | Status: DC

## 2015-01-18 NOTE — Patient Instructions (Signed)
Your physician wants you to follow-up in: 4-6 months with Dr Kelly. You will receive a reminder letter in the mail two months in advance. If you don't receive a letter, please call our office to schedule the follow-up appointment. 

## 2015-01-20 ENCOUNTER — Encounter: Payer: Self-pay | Admitting: Cardiovascular Disease

## 2015-01-20 NOTE — Progress Notes (Signed)
Patient ID: Jacqueline Kim, female   DOB: 21-Aug-1945, 69 y.o.   MRN: [id number]     HPI: Jacqueline Kim is a 69 y.o. female  who presents for follow-up cardiologic evaluation of her recurrent atrial fibrillation.    Jacqueline Kim has known history of sinus node dysfunction. She has history of paroxysmal atrial fibrillation. She is status post permanent pacemaker implanted on 04/08/2011 Medtronic Revo MRI compatible pacemaker. She also has a history of hypothyroidism. She has had recurrent episodes of paroxysmal atrial fibrillation and has been on pradaxa anticoagulation for several years after initially having been on warfarin.  She has been maintained on rhythmol SR 325 mg twice a day and atenolol 50 mg daily.  She also is on losartan 50 mg for hypertension.  She was hospitalized in January 2014 with recurrent AF in the setting of significant increased family stress. She is now separated and living in a different household from her husband [name].  She has had several episodes of recurrent AF and had an episode on 03/12/2013 which lasted for approximately 12 hours.  On the Saturday before Easter 2015 when she was rushing with family members and trying to prepare dinner she was hospitalized and presented with a heart rate at 150.  She was started on IV Cardizem for rate control.  She spontaneously cardioverted to sinus rhythm on April 6 prior to a planned TEE cardioversion.  I saw her in followup of that hospitalization and suggested an EP evaluation.  She saw [name] for discussion concerning atrial fibrillation ablation.  Apparently, at that time, the patient was feeling well. She opted against pursuing AF ablation presently.    A followup echo Doppler study on 10/24/2013 showed an ejection fraction of 60-65% and there was evidence for grade 1 diastolic function without wall motion abnormalities.  Her left atrial dimension was normal.    She is followed by [name] for her pacemaker.  She was  hospitalized in early February 2016 following a prolonged episode of atrial fibrillation with rapid ventricular response for which she was symptomatic.  She underwent her initial cardioversion on 07/27/2014.  When she saw [name] on August 08, 2014 interrogation of her pacemaker showed a 28 hour episode of atrial fibrillation without recurrence since the cardioversion.  She again developed a recurrent episode of atrial fibrillation on 08/30/2014 which led to an emergency room evaluation.  I last saw her in March.  I recommended follow-up evaluation with [name] and reconsideration of possible atrial fibrillation ablation.  She saw [name] at that time admitted that she was drinking wine on a daily basis.  Rather than pursue ablation she elected to reduce her EtOH intake.  She feels that she has done well and has felt one episode of palpitations during her period of increased stress that she is aware of since her last evaluation..  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  She denies exertional chest pain, shortness of breath, orthopnea, PND, presyncope or syncope or any neurologic sequelae.  When she met with [name].  There was no discussion of changing her anti-rhythmic therapy.    Since I saw her, she states that she underwent left knee surgery by [name].  She also fell on July 6 in the shower and was hospitalized for 2 days.  She developed a cellulitis and buttock abscess, which required anabiotic therapy.  She presents for evaluation.  Past Medical History  Diagnosis Date  . Paroxysmal a-fib diagnosed 2008    a. On propafenone, Pradaxa.  . 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 Hypothyroid   . Anxiety   . Hypertension   . Glaucoma   . Complication of anesthesia     DIFFICULTY WAKING UP  . Arthritis   . Anticoagulant long-term use  WITH PRADAXA 05/27/2011  . DJD (degenerative joint disease)    . GERD (gastroesophageal reflux disease)   . IBS (irritable bowel syndrome)   . Depression   . Diverticulosis   . External hemorrhoids   . Esophagitis 09/17/06  . History of stress test     a. 2008 - normal nuc.  . Presence of permanent cardiac pacemaker   . Cellulitis of buttock, left 12/20/2014    Past Surgical History  Procedure Laterality Date  . Pacemaker insertion  04/08/11    MDT Revo implanted by Dr Recardo Evangelist  . Eye surgery Bilateral     cataracts  . Vaginal hysterectomy    . Liposuction    . Ovarian cyst removal    . Foot surgery    . Hand surgery      multiple  . Breast reduction surgery    . Facial cosmetic surgery    . Tonsillectomy    . Nm myocar perf wall motion  12/29/2006    No significant ischemia; EF 69%  . Cardioversion N/A 08/02/2014    Procedure: CARDIOVERSION;  Surgeon: Sueanne Margarita, MD;  Location: [facility];  Service: Cardiovascular;  Laterality: N/A;    Allergies  Allergen Reactions  . Azithromycin Other (See Comments)    Causes heart rhythm issues  . Epinephrine Other (See Comments)    Causes A-Fib  . Penicillins Anaphylaxis  . Metoprolol     alopecia  . Lidocaine Palpitations    Or any similar medication    Current Outpatient Prescriptions  Medication Sig Dispense Refill  . atenolol (TENORMIN) 50 MG tablet Take 1 tablet (50 mg total) by mouth every morning. 90 tablet 3  . Biotin 5000 MCG TABS Take 1 tablet by mouth daily.    . celecoxib (CELEBREX) 200 MG capsule Take 200 mg by mouth daily as needed (pain).    . dabigatran (PRADAXA) 150 MG CAPS capsule Take 1 capsule (150 mg total) by mouth 2 (two) times daily. TAKE ONE CAPSULE BY MOUTH EVERY 12 HOURS lot #[id number] exp 5/18 60 capsule 0  . dicyclomine (BENTYL) 10 MG capsule TAKE ONE CAPSULE 3 TIMES DAILY BEFORE MEALS 90 capsule 2  . diltiazem (CARDIZEM CD) 120 MG 24 hr capsule Take 1 capsule (120 mg total) by mouth at bedtime. 30 capsule 6  . docusate sodium (COLACE) 100 MG capsule Take  200 mg by mouth daily.    Marland Kitchen escitalopram (LEXAPRO) 20 MG tablet Take 20 mg by mouth daily.     . furosemide (LASIX) 20 MG tablet Take 1 tablet (20 mg total) by mouth daily as needed for fluid. 30 tablet   . Garcinia Cambogia-Chromium 500-200 MG-MCG TABS Take 1 capsule by mouth daily.    Marland Kitchen HYDROcodone-acetaminophen (NORCO) 7.5-325 MG per tablet Take 1 tablet by mouth every 6 (six) hours as needed. Moderate pain    . levothyroxine (SYNTHROID, LEVOTHROID) 112 MCG tablet Take 112 mcg by mouth every evening.     Marland Kitchen losartan (COZAAR) 50 MG tablet Take 1 tablet (50 mg total) by mouth 2 (two) times daily. 360 tablet  0  . omeprazole (PRILOSEC) 40 MG capsule Take 40 mg by mouth daily as needed (increase in stomach acid).     . propafenone (RYTHMOL) 150 MG tablet Take 1.5 tablet in the morning, 1 tablet midday, 1.5 tablet in the evening 360 tablet 3  . traZODone (DESYREL) 150 MG tablet Take 150 mg by mouth at bedtime.      . zaleplon (SONATA) 10 MG capsule Take 10 mg by mouth at bedtime.      No current facility-administered medications for this visit.    History   Social History  . Marital Status: Legally Separated    Spouse Name: N/A  . Number of Children: 3  . Years of Education: N/A   Occupational History  . retired    Social History Main Topics  . Smoking status: Former Smoker    Quit date: 08/30/1996  . Smokeless tobacco: Never Used  . Alcohol Use: 3.6 oz/week    6 Glasses of wine per week     Comment: daily  . Drug Use: No  . Sexual Activity: Yes   Other Topics Concern  . Not on file   Social History Narrative   Lives in [place],  Former [place] [employer],  Separated.      Family History  Problem Relation Age of Onset  . Anesthesia problems Mother   . Colon cancer Neg Hx   . Colon polyps Mother     beign   ROS General: Negative; No fevers, chills, or night sweats;  HEENT: Negative; No changes in vision or hearing, sinus congestion, difficulty  swallowing Pulmonary: Negative; No cough, wheezing, shortness of breath, hemoptysis Cardiovascular:  See HPI GI: Positive for irritable bowel syndrome for which she takes Bentyl before meals;  GU: Negative; No dysuria, hematuria, or difficulty voiding Musculoskeletal: Negative; no myalgias, joint pain, or weakness Hematologic/Oncology: Negative; no easy bruising, bleeding Endocrine: Negative; no heat/cold intolerance; no diabetes Neuro: Negative; no changes in balance, headaches Skin: Negative; No rashes or skin lesions Psychiatric: There is less stress and anxiety; No behavioral problems, depression Sleep: Negative; No snoring, daytime sleepiness, hypersomnolence, bruxism, restless legs, hypnogognic hallucinations, no cataplexy Other comprehensive 14 point system review is negative.   PE BP 118/76 mmHg  Pulse 66  Ht '5\' 6"'  (1.676 m)  Wt 150 lb 12.8 oz (68.402 kg)  BMI 24.35 kg/m2  Wt Readings from Last 3 Encounters:  01/18/15 150 lb 12.8 oz (68.402 kg)  12/22/14 156 lb 15.5 oz (71.2 kg)  12/18/14 150 lb (68.04 kg)   General: Alert, oriented, no distress.  Skin: normal turgor, no rashes HEENT: Normocephalic, atraumatic. Pupils round and reactive; sclera anicteric;no lid lag.  Nose without nasal septal hypertrophy Mouth/Parynx benign; Mallinpatti scale 2 Neck: No JVD, no carotid bruits with normal carotid upstroke. Lungs: clear to ausculatation and percussion; no wheezing or rales Chest wall: Nontender to palpation Heart: PMI is not displaced RRR, s1 s2 normal 1/6 systolic murmur; no S3 or S4 gallop.  No rubs, thrills or heaves. Abdomen: soft, nontender; no hepatosplenomehaly, BS+; abdominal aorta nontender and not dilated by palpation. Back: No CVA tenderness Pulses 2+ Extremities: no clubbing cyanosis or edema, Homan's sign negative  Neurologic: grossly nonfocal Psychologic: normal affect and mood.  ECG (independently read by me): Atrially paced rhythm at 66 bpm.  PR  interval 232 ms.  QTc interval normal at 448 ms.  ECG (independently read by me): Atrially paced rhythm at 64 bpm.  No AF.  QTC 435 ms.  December  2015 ECG (independently read by me): Atrially paced rhythm at 65 with prolonged AV conduction with a PR interval at 270 ms.  No significant ST segment changes.   June 2015 ECG (independently read by me): Atrially paced rhythm at 62 beats per minute with prolonged AV conduction with a PR interval of 244 ms.  QTc interval 444 ms.  Prior 09/22/2013 ECG (independently read by me): Atrial paced rhythm at 63 beats per minute with ventricular sensing.   LABS:  BMET  BMP Latest Ref Rng 12/21/2014 12/20/2014 12/20/2014  Glucose 65 - 99 mg/dL 110(H) 134(H) 125(H)  BUN 6 - 20 mg/dL 23(H) 26(H) 34(H)  Creatinine 0.44 - 1.00 mg/dL 1.45(H) 1.63(H) 2.12(H)  Sodium 135 - 145 mmol/L 133(L) 132(L) 127(L)  Potassium 3.5 - 5.1 mmol/L 3.7 3.7 3.8  Chloride 101 - 111 mmol/L 108 105 99(L)  CO2 22 - 32 mmol/L 20(L) 20(L) 18(L)  Calcium 8.9 - 10.3 mg/dL 7.7(L) 8.2(L) 8.5(L)     Hepatic Function Panel   Hepatic Function Latest Ref Rng 12/21/2014 12/20/2014 07/31/2014  Total Protein 6.5 - 8.1 g/dL 4.8(L) 6.0(L) 5.9(L)  Albumin 3.5 - 5.0 g/dL 2.3(L) 3.0(L) 3.3(L)  AST 15 - 41 U/L '19 25 16  ' ALT 14 - 54 U/L 13(L) 14 11  Alk Phosphatase 38 - 126 U/L 37(L) 47 39  Total Bilirubin 0.3 - 1.2 mg/dL 0.4 0.5 0.4  Bilirubin, Direct 0.0 - 0.3 mg/dL - - -     CBC  CBC Latest Ref Rng 12/21/2014 12/20/2014 08/29/2014  WBC 4.0 - 10.5 K/uL 8.1 12.3(H) 7.5  Hemoglobin 12.0 - 15.0 g/dL 9.0(L) 10.7(L) 11.6(L)  Hematocrit 36.0 - 46.0 % 27.4(L) 31.7(L) 36.0  Platelets 150 - 400 K/uL 196 157 317     BNP    Component Value Date/Time   PROBNP 1110.0* 09/18/2013 0332    Lipid Panel     Component Value Date/Time   CHOL  12/19/2006 0315    172        ATP III CLASSIFICATION:  <200     mg/dL   Desirable  200-239  mg/dL   Borderline High  >=240    mg/dL   High   TRIG 293* 12/19/2006  0315   HDL 37* 12/19/2006 0315   CHOLHDL 4.6 12/19/2006 0315   VLDL 59* 12/19/2006 0315   LDLCALC  12/19/2006 0315    76        Total Cholesterol/HDL:CHD Risk Coronary Heart Disease Risk Table                     Men   Women  1/2 Average Risk   3.4   3.3     RADIOLOGY: No results found.    ASSESSMENT AND PLAN: Ms. Shatha Hooser has a history of PAF and status post permanent pacemaker for sinus node dysfunction. She is anticoagulated with her Pradaxa and has a Chads2vasc score of at least 3. Her echo Doppler study on 10/24/2013 showed normal systolic function with an ejection fraction of 60-65%.  There was grade 1 diastolic dysfunction.  There were no regional wall motion abnormalities.  The left atrium was normal in size.  In the past, her episodes of atrial fibrillation have seen to occur during periods of increased stress but she has experienced several episodes of recurrent AF which do not appear to be stress mediated.   She has seen Dr. Darlyn Read on 2 occasions.  Her last evaluation was in April 2016.  At this  point, she is still against pursuing atrial fibrillation ablation.  She believes that her arrhythmia has improved with reduction of her wine intake.  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  Her blood pressure today is controlled on her current regimen.  She has tolerated the addition of Cardizem CD 120 mg at bedtime and she continues to take the atenolol 50 mg every morning.  In addition to Propofenone 150 mg tablet for which she takes 1-1/2 in the morning and at night and 1 tablet at midday.  She denies any significant shortness of breath.  There is no edema today.  She is tolerating losartan 100 mg daily.  She will have a follow-up pacemaker evaluation next month with [name].  She is currently atrially paced.  She is on thyroid replacement medicine for hypothyroidism.  I will see her in 4-6 months  for follow-up evaluation.   Time spent: 25 minutes  Troy Sine, MD, [facility]  01/20/2015 2:54 PM

## 2015-02-20 ENCOUNTER — Encounter: Payer: Self-pay | Admitting: Cardiovascular Disease

## 2015-02-20 ENCOUNTER — Telehealth: Payer: Self-pay | Admitting: Cardiovascular Disease

## 2015-02-20 ENCOUNTER — Ambulatory Visit (INDEPENDENT_AMBULATORY_CARE_PROVIDER_SITE_OTHER): Payer: Medicare Other | Admitting: Cardiovascular Disease

## 2015-02-20 VITALS — BP 84/56 | HR 65 | Resp 16 | Ht 66.0 in | Wt 150.9 lb

## 2015-02-20 DIAGNOSIS — I10 Essential (primary) hypertension: Secondary | ICD-10-CM

## 2015-02-20 DIAGNOSIS — I480 Paroxysmal atrial fibrillation: Secondary | ICD-10-CM

## 2015-02-20 DIAGNOSIS — Z7901 Long term (current) use of anticoagulants: Secondary | ICD-10-CM

## 2015-02-20 DIAGNOSIS — I4891 Unspecified atrial fibrillation: Secondary | ICD-10-CM

## 2015-02-20 DIAGNOSIS — I952 Hypotension due to drugs: Secondary | ICD-10-CM

## 2015-02-20 DIAGNOSIS — I495 Sick sinus syndrome: Secondary | ICD-10-CM | POA: Diagnosis not present

## 2015-02-20 DIAGNOSIS — E871 Hypo-osmolality and hyponatremia: Secondary | ICD-10-CM

## 2015-02-20 MED ORDER — LOSARTAN POTASSIUM 50 MG PO TABS: 50.0000 mg | ORAL_TABLET | Freq: Every day | ORAL | Status: DC

## 2015-02-20 MED ORDER — APIXABAN 5 MG PO TABS: 5.0000 mg | ORAL_TABLET | Freq: Two times a day (BID) | ORAL | Status: DC

## 2015-02-20 NOTE — Telephone Encounter (Signed)
Ok to try Eliquis 5 mg twice daily, take first dose 12 hours after last dose of Pradaxa

## 2015-02-20 NOTE — Telephone Encounter (Signed)
Patient needing recommendation for alternative to Pradaxa.

## 2015-02-20 NOTE — Telephone Encounter (Signed)
She wants to switch from Pradaxa,it is too expensive. She wants to know what can she switched to,say Burna Mortimer had mention one that she might be able to take.

## 2015-02-20 NOTE — Patient Instructions (Addendum)
Dr Royann Shivers has recommended making the following medication changes: DECREASE Losartan to 50 mg ONCE DAILY.  Dr Royann Shivers recommends that you schedule a follow-up appointment 6 months with a device check. You will receive a reminder letter in the mail two months in advance. If you don't receive a letter, please call our office to schedule the follow-up appointment.

## 2015-02-20 NOTE — Progress Notes (Signed)
Patient ID: Jacqueline Kim, female   DOB: Feb 25, 1946, 69 y.o.   MRN: [id number]     Cardiology Office Note   Date:  02/20/2015   ID:  Jacqueline Kim, DOB December 19, 1945, MRN [id number]  PCP:  Thayer Headings, MD  Cardiologist:  Nicki Guadalajara, MD;  Thurmon Fair, MD   Chief Complaint  Patient presents with  . Follow-up    6 months pacer check      History of Present Illness: Jacqueline Kim is a 69 y.o. female who presents for pacemaker check. She feels intermittently dizzy. She has (intentionally) lost 10 pounds and is occasionally dizzy. Her BP is quite low today, especially on standing. She denies syncope, nausea or vomiting or diarrhea, bleeding or poor PO intake. Ske took "all of her meds" at noon today.  She has recurrent paroxysmal atrial fibrillation, but the overall burden has been low, punctuated by severely symptomatic episodes due to RVR. She has only had one episode that persisted enough to require cardioversion in February 2016. Propafenone has offered satisfactory control, but she needed a pacemaker due to symptomatic bradycardia (Medtronic Revo 2012).  The device is functioning normally. The overall burden of atrial fibrillation is 1.3%. She had a long episode (about one day long) of Afib on July 10, good rate control. This was preceded on July 9th by a multitude of episodes of PAT wit abrupt onset and termination, 1:1 AV conduction and rates of 145-167 bpm (mostly under one minute, one episode lasted 4 min 54 sec). She had been hospitalized just before these events for buttock cellulitis for a couple of days.  Past Medical History  Diagnosis Date  . Paroxysmal a-fib diagnosed 2008    a. On propafenone, Pradaxa.  . 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 Hypothyroid   . Anxiety   . Hypertension   . Glaucoma   . Complication of anesthesia     DIFFICULTY WAKING UP  . Arthritis   . Anticoagulant long-term use  WITH PRADAXA 05/27/2011  .  DJD (degenerative joint disease)   . GERD (gastroesophageal reflux disease)   . IBS (irritable bowel syndrome)   . Depression   . Diverticulosis   . External hemorrhoids   . Esophagitis 09/17/06  . History of stress test     a. 2008 - normal nuc.  . Presence of permanent cardiac pacemaker   . Cellulitis of buttock, left 12/20/2014    Past Surgical History  Procedure Laterality Date  . Pacemaker insertion  04/08/11    MDT Revo implanted by Dr Rubie Maid  . Eye surgery Bilateral     cataracts  . Vaginal hysterectomy    . Liposuction    . Ovarian cyst removal    . Foot surgery    . Hand surgery      multiple  . Breast reduction surgery    . Facial cosmetic surgery    . Tonsillectomy    . Nm myocar perf wall motion  12/29/2006    No significant ischemia; EF 69%  . Cardioversion N/A 08/02/2014    Procedure: CARDIOVERSION;  Surgeon: Quintella Reichert, MD;  Location: [facility];  Service: Cardiovascular;  Laterality: N/A;     Current Outpatient Prescriptions  Medication Sig Dispense Refill  . atenolol (TENORMIN) 50 MG tablet Take 1 tablet (50 mg total) by mouth every morning. 90 tablet 3  . Biotin 5000 MCG TABS Take 1 tablet by mouth daily.    . celecoxib (  CELEBREX) 200 MG capsule Take 200 mg by mouth daily as needed (pain).    Marland Kitchen dicyclomine (BENTYL) 10 MG capsule TAKE ONE CAPSULE 3 TIMES DAILY BEFORE MEALS 90 capsule 2  . diltiazem (CARDIZEM CD) 120 MG 24 hr capsule Take 1 capsule (120 mg total) by mouth at bedtime. 30 capsule 6  . docusate sodium (COLACE) 100 MG capsule Take 200 mg by mouth daily.    Marland Kitchen escitalopram (LEXAPRO) 20 MG tablet Take 20 mg by mouth daily.     . furosemide (LASIX) 20 MG tablet Take 1 tablet (20 mg total) by mouth daily as needed for fluid. 30 tablet   . Garcinia Cambogia-Chromium 500-200 MG-MCG TABS Take 1 capsule by mouth daily.    Marland Kitchen HYDROcodone-acetaminophen (NORCO) 7.5-325 MG per tablet Take 1 tablet by mouth every 6 (six) hours as needed. Moderate pain     . levothyroxine (SYNTHROID, LEVOTHROID) 112 MCG tablet Take 112 mcg by mouth every evening.     Marland Kitchen losartan (COZAAR) 50 MG tablet Take 1 tablet (50 mg total) by mouth daily. 360 tablet 0  . omeprazole (PRILOSEC) 40 MG capsule Take 40 mg by mouth daily as needed (increase in stomach acid).     . propafenone (RYTHMOL) 150 MG tablet Take 1.5 tablet in the morning, 1 tablet midday, 1.5 tablet in the evening 360 tablet 3  . traZODone (DESYREL) 150 MG tablet Take 150 mg by mouth at bedtime.      . zaleplon (SONATA) 10 MG capsule Take 10 mg by mouth at bedtime.     Marland Kitchen apixaban (ELIQUIS) 5 MG TABS tablet Take 1 tablet (5 mg total) by mouth 2 (two) times daily. 60 tablet 6   No current facility-administered medications for this visit.    Allergies:   Azithromycin; Epinephrine; Penicillins; Metoprolol; and Lidocaine    Social History:  The patient  reports that she quit smoking about 18 years ago. She has never used smokeless tobacco. She reports that she drinks about 3.6 oz of alcohol per week. She reports that she does not use illicit drugs.   Family History:  The patient's family history includes Anesthesia problems in her mother; Colon polyps in her mother. There is no history of Colon cancer.    ROS:  Please see the history of present illness.    Otherwise, review of systems positive for none.   All other systems are reviewed and negative.    PHYSICAL EXAM: VS:  BP 84/56 mmHg  Pulse 65  Resp 16  Ht 5\' 6"  (1.676 m)  Wt 150 lb 14.4 oz (68.448 kg)  BMI 24.37 kg/m2  SpO2 95% , BMI Body mass index is 24.37 kg/(m^2).  General: Alert, oriented x3, no distress Head: no evidence of trauma, PERRL, EOMI, no exophtalmos or lid lag, no myxedema, no xanthelasma; normal ears, nose and oropharynx Neck: normal jugular venous pulsations and no hepatojugular reflux; brisk carotid pulses without delay and no carotid bruits Chest: clear to auscultation, no signs of consolidation by percussion or palpation,  normal fremitus, symmetrical and full respiratory excursions, healthy left subclavian PM site Cardiovascular: normal position and quality of the apical impulse, regular rhythm, normal first and second heart sounds, no  murmurs, rubs or gallops Abdomen: no tenderness or distention, no masses by palpation, no abnormal pulsatility or arterial bruits, normal bowel sounds, no hepatosplenomegaly Extremities: no clubbing, cyanosis or edema; 2+ radial, ulnar and brachial pulses bilaterally; 2+ right femoral, posterior tibial and dorsalis pedis pulses; 2+ left femoral, posterior  tibial and dorsalis pedis pulses; no subclavian or femoral bruits Neurological: grossly nonfocal Psych: euthymic mood, full affect   EKG:  EKG is ordered today. The ekg ordered today demonstrates NSR, QTC 433 ms   Recent Labs: 08/29/2014: Magnesium 1.8 08/30/2014: TSH 6.630* 12/21/2014: ALT 13*; BUN 23*; Creatinine, Ser 1.45*; Hemoglobin 9.0*; Platelets 196; Potassium 3.7; Sodium 133*    Lipid Panel    Component Value Date/Time   CHOL  12/19/2006 0315    172        ATP III CLASSIFICATION:  <200     mg/dL   Desirable  458-592  mg/dL   Borderline High  >=924    mg/dL   High   TRIG 462* 86/38/1771 0315   HDL 37* 12/19/2006 0315   CHOLHDL 4.6 12/19/2006 0315   VLDL 59* 12/19/2006 0315   LDLCALC  12/19/2006 0315    76        Total Cholesterol/HDL:CHD Risk Coronary Heart Disease Risk Table                     Men   Women  1/2 Average Risk   3.4   3.3      Wt Readings from Last 3 Encounters:  02/20/15 150 lb 14.4 oz (68.448 kg)  01/18/15 150 lb 12.8 oz (68.402 kg)  12/22/14 156 lb 15.5 oz (71.2 kg)      Other studies Reviewed: Additional studies/ records that were reviewed today include: Dr. Landry Dyke last office note.   ASSESSMENT AND PLAN:  1. Recurrent paroxysmal atrial fibrillation Low burden, recent episodes related to extracardiac illness/infection, good rate control. Several brief episodes of PAT with  1:1 conduction during acute illness. CHADSVasc at least 3, on anticoagulation. Pradaxa is too expensive, believes Eliquis would be cheaper so I gave a prescription for the latter.  2. 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 Normally functioning dual chamber pacemaker  4. Hypotension Asked her to reduce losartan to once daily, temporarily increase water and sodium intake, check BP at home and call us with readings in a few days.  Current medicines are reviewed at length with the patient today.  The patient has concerns regarding medicines, as above.  Labs/ tests ordered today include:  Orders Placed This Encounter  Procedures  . EKG 12-Lead    Patient Instructions  Dr Royann Shivers has recommended making the following medication changes: DECREASE Losartan to 50 mg ONCE DAILY.  Dr Royann Shivers recommends that you schedule a follow-up appointment 6 months with a device check. You will receive a reminder letter in the mail two months in advance. If you don't receive a letter, please call our office to schedule the follow-up appointment.     Joie Bimler, MD  02/20/2015 9:47 PM    Thurmon Fair, MD, [facility] [phone] office [phone] pager

## 2015-02-20 NOTE — Telephone Encounter (Signed)
Pt coming in today to see Dr. Royann Shivers for pacer check - informed her of Eliquis Rx recommendation, will leave samples for patient.  She requested written Rx when she comes in today.

## 2015-02-21 NOTE — Telephone Encounter (Signed)
Patient given a Rx for Eliquis along with samples.

## 2015-02-22 ENCOUNTER — Other Ambulatory Visit: Payer: Self-pay | Admitting: *Deleted

## 2015-02-22 MED ORDER — LOSARTAN POTASSIUM 50 MG PO TABS: 50.0000 mg | ORAL_TABLET | Freq: Every day | ORAL | Status: DC

## 2015-03-01 ENCOUNTER — Telehealth: Payer: Self-pay | Admitting: Internal Medicine

## 2015-03-02 MED ORDER — DICYCLOMINE HCL 10 MG PO CAPS: ORAL_CAPSULE | ORAL | Status: DC

## 2015-03-02 NOTE — Telephone Encounter (Signed)
Ok to refill until follow-up 

## 2015-03-02 NOTE — Telephone Encounter (Signed)
Doc of the day. Patient haven't been seen since 10/21/13, request a refill on dicyclomine. Have an appt with Dr. Lavon Paganini. Please advise.

## 2015-03-02 NOTE — Telephone Encounter (Signed)
Medication refilled

## 2015-03-05 ENCOUNTER — Other Ambulatory Visit: Payer: Self-pay | Admitting: *Deleted

## 2015-03-05 ENCOUNTER — Telehealth: Payer: Self-pay | Admitting: *Deleted

## 2015-03-05 MED ORDER — APIXABAN 5 MG PO TABS: 5.0000 mg | ORAL_TABLET | Freq: Two times a day (BID) | ORAL | Status: DC

## 2015-03-05 NOTE — Telephone Encounter (Signed)
Faxed losartan prescription to [employer] [facility]  @ [phone].

## 2015-03-05 NOTE — Telephone Encounter (Signed)
Prescription for Eliquis 5mg  reprinted for a 90 day supply w/3 refills to be submitted with an application to the company for free medication.  They will pick up once Dr. signs the Rx and form.

## 2015-03-05 NOTE — Telephone Encounter (Signed)
Patient notified form and Rx ready for pick up for the patient assistance program with Jacqueline Kim for the Eliquis.

## 2015-03-08 ENCOUNTER — Telehealth: Payer: Self-pay | Admitting: Cardiovascular Disease

## 2015-03-08 MED ORDER — DILTIAZEM HCL ER COATED BEADS 120 MG PO CP24: 120.0000 mg | ORAL_CAPSULE | Freq: Every day | ORAL | Status: DC

## 2015-03-08 NOTE — Telephone Encounter (Signed)
Pt needed refill on diltiazem [facility] to [facility], but also low on pills so needed sent to local pharmacy as well. Requested [facility].  Rx sent to both pharmacies, instructed pt to call if further needs. Pt voiced thanks.

## 2015-03-08 NOTE — Telephone Encounter (Signed)
Jacqueline Kim because she dropped off a prescription and is inquiring about it .Marland Kitchen Thanks

## 2015-03-18 LAB — CUP PACEART INCLINIC DEVICE CHECK
Battery Voltage: 3 V
Brady Statistic AS VP Percent: 0.03 %
Brady Statistic RA Percent Paced: 77.68 %
Date Time Interrogation Session: 20160906191821
Lead Channel Impedance Value: 440 Ohm
Lead Channel Impedance Value: 512 Ohm
Lead Channel Setting Sensing Sensitivity: 0.9 mV
MDC IDC MSMT LEADCHNL RA SENSING INTR AMPL: 3.434 mV
MDC IDC MSMT LEADCHNL RV SENSING INTR AMPL: 9.372 mV
MDC IDC SET LEADCHNL RA PACING AMPLITUDE: 2 V
MDC IDC SET LEADCHNL RV PACING AMPLITUDE: 2.5 V
MDC IDC SET LEADCHNL RV PACING PULSEWIDTH: 0.6 ms
MDC IDC STAT BRADY AP VP PERCENT: 0.1 %
MDC IDC STAT BRADY AP VS PERCENT: 77.58 %
MDC IDC STAT BRADY AS VS PERCENT: 22.29 %
MDC IDC STAT BRADY RV PERCENT PACED: 0.13 %
Zone Setting Detection Interval: 400 ms
Zone Setting Detection Interval: 400 ms

## 2015-03-19 ENCOUNTER — Other Ambulatory Visit: Payer: Self-pay | Admitting: *Deleted

## 2015-03-19 ENCOUNTER — Telehealth: Payer: Self-pay | Admitting: Cardiovascular Disease

## 2015-03-19 MED ORDER — APIXABAN 5 MG PO TABS: 5.0000 mg | ORAL_TABLET | Freq: Two times a day (BID) | ORAL | Status: DC

## 2015-03-19 NOTE — Telephone Encounter (Signed)
Left message for pt on personal voicemail, no samples of eliquis available at this time. She is to call back if script needed. Also will forward to dr [name] for clearance for knee surgery.

## 2015-03-19 NOTE — Telephone Encounter (Signed)
OK for surgery but will need to hold eliquis for 48 hrs prior to surgery

## 2015-03-19 NOTE — Telephone Encounter (Signed)
Pt called in wanting to speak with Burna Mortimer to see if Dr. [name] approves of her having knee replacement surgery and she is also requesting samples.  Patient calling the office for samples of medication:   1.  What medication and dosage are you requesting samples for? Eliquis  2.  Are you currently out of this medication? Yes   3. Are you requesting samples to get you through until a mail order prescription arrives? No

## 2015-03-19 NOTE — Telephone Encounter (Signed)
Spoke with pt, aware of dr kelly's recommendations. 

## 2015-03-22 ENCOUNTER — Telehealth: Payer: Self-pay | Admitting: *Deleted

## 2015-03-22 NOTE — Telephone Encounter (Signed)
[employer] denied patient assistance for Eliquis due to income, 03/09/2015.

## 2015-03-28 ENCOUNTER — Encounter: Payer: Self-pay | Admitting: Cardiovascular Disease

## 2015-04-05 ENCOUNTER — Telehealth: Payer: Self-pay | Admitting: *Deleted

## 2015-04-05 NOTE — Telephone Encounter (Signed)
Request for surgical clearance:  1. What type of surgery is being performed? Left TKA-MEDIAL & LATERAL W/WO PATELLA RESURFACING  2. When is this surgery scheduled?  05/01/15  3. Are there any medications that need to be held prior to surgery and how long? ELIQUIS 5 mg twice a day.  4. Name of physician performing surgery? Durene Romans  5. What is your office phone and fax number?  Phone: [phone] Fax; [phone]

## 2015-04-17 NOTE — Telephone Encounter (Signed)
OK for surgery; hold eliquis for at lease 48 hrs

## 2015-04-17 NOTE — Telephone Encounter (Signed)
Forwarded to Dr. Durene Romans

## 2015-04-18 NOTE — H&P (Signed)
TOTAL KNEE ADMISSION H&P  Patient is being admitted for left total knee arthroplasty.  Subjective:  Chief Complaint:   Left knee primary OA / pain  HPI: Jacqueline Kim, 69 y.o. female, has a history of pain and functional disability in the left knee due to arthritis and has failed non-surgical conservative treatments for greater than 12 weeks to include NSAID's and/or analgesics, corticosteriod injections, viscosupplementation injections and activity modification.  Onset of symptoms was abrupt, starting June 25, 2014 with gradually worsening course since that time. The patient noted prior procedures on the knee to include  arthroscopy and menisectomy on the left knee(s).  Patient currently rates pain in the left knee(s) at 10 out of 10 with activity. Patient has night pain, worsening of pain with activity and weight bearing, pain that interferes with activities of daily living, pain with passive range of motion, crepitus and joint swelling.  Patient has evidence of periarticular osteophytes and joint space narrowing by imaging studies.  There is no active infection.  Risks, benefits and expectations were discussed with the patient.  Risks including but not limited to the risk of anesthesia, blood clots, nerve damage, blood vessel damage, failure of the prosthesis, infection and up to and including death.  Patient understand the risks, benefits and expectations and wishes to proceed with surgery.   PCP: Thayer Headings, MD  D/C Plans:      Home with HHPT  Post-op Meds:       No Rx given  Tranexamic Acid:      To be given - IV   Decadron:      Is to be given  FYI:     Eliquis  Oxycodone  DME needs: RW and 3-n-1    Patient Active Problem List   Diagnosis Date Noted  . Hypotension due to drugs 02/20/2015  . Cellulitis of left buttock 12/20/2014  . Cellulitis 12/20/2014  . AKI (acute kidney injury) (HCC) 12/20/2014  . Hyponatremia 12/20/2014  . Paroxysmal atrial fibrillation (HCC)  08/01/2014  . Irritable bowel syndrome 06/14/2014  . Fatigue 03/06/2014  . HTN (hypertension) 09/28/2013  . Atrial fibrillation with rapid ventricular response; paroxysmal 09/18/2013  . Chest pain 10/08/2012  . D-dimer, elevated, pt. on pradaxa 09/11/2011  . Chest pain, negative MI, resolved once heart rate slowed, secondary to rapid a. fib 09/11/2011  . Paroxysmal a-fib, recurrent 07/13/12- (on Propafenone)  05/27/2011  . Sinus node dysfunction (HCC) 05/27/2011  . Hypothyroid 05/27/2011  . 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 Anxiety 05/27/2011  . Anticoagulant long-term use  WITH PRADAXA, CHADS Vasc score 3 05/27/2011   Past Medical History  Diagnosis Date  . Paroxysmal a-fib diagnosed 2008    a. On propafenone, Pradaxa.  . 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 Hypothyroid   . Anxiety   . Hypertension   . Glaucoma   . Complication of anesthesia     DIFFICULTY WAKING UP  . Arthritis   . Anticoagulant long-term use  WITH PRADAXA 05/27/2011  . DJD (degenerative joint disease)   . GERD (gastroesophageal reflux disease)   . IBS (irritable bowel syndrome)   . Depression   . Diverticulosis   . External hemorrhoids   . Esophagitis 09/17/06  . History of stress test     a. 2008 - normal nuc.  . Presence of permanent cardiac pacemaker   . Cellulitis of buttock, left 12/20/2014    Past Surgical History  Procedure Laterality Date  .  Pacemaker insertion  04/08/11    MDT Revo implanted by Dr Rubie Maid  . Eye surgery Bilateral     cataracts  . Vaginal hysterectomy    . Liposuction    . Ovarian cyst removal    . Foot surgery    . Hand surgery      multiple  . Breast reduction surgery    . Facial cosmetic surgery    . Tonsillectomy    . Nm myocar perf wall motion  12/29/2006    No significant ischemia; EF 69%  . Cardioversion N/A 08/02/2014    Procedure: CARDIOVERSION;  Surgeon: Quintella Reichert, MD;  Location: [facility]  ENDOSCOPY;  Service: Cardiovascular;  Laterality: N/A;    No prescriptions prior to admission   Allergies  Allergen Reactions  . Azithromycin Other (See Comments)    Causes heart rhythm issues  . Epinephrine Other (See Comments)    Causes A-Fib  . Penicillins Anaphylaxis    Has patient had a PCN reaction causing immediate rash, facial/tongue/throat swelling, SOB or lightheadedness with hypotension: Yes Has patient had a PCN reaction causing severe rash involving mucus membranes or skin necrosis: No Has patient had a PCN reaction that required hospitalization No Has patient had a PCN reaction occurring within the last 10 years: No If all of the above answers are "NO", then may proceed with Cephalosporin use.   . Metoprolol     alopecia  . Lidocaine Palpitations    Or any similar medication    Social History  Substance Use Topics  . Smoking status: Former Smoker    Quit date: 08/30/1996  . Smokeless tobacco: Never Used  . Alcohol Use: 3.6 oz/week    6 Glasses of wine per week     Comment: daily    Family History  Problem Relation Age of Onset  . Anesthesia problems Mother   . Colon cancer Neg Hx   . Colon polyps Mother     beign     Review of Systems  Constitutional: Positive for malaise/fatigue.  HENT: Negative.   Eyes: Negative.   Respiratory: Negative.   Cardiovascular: Negative.   Gastrointestinal: Positive for heartburn.  Genitourinary: Negative.   Musculoskeletal: Positive for joint pain.  Skin: Negative.   Neurological: Negative.   Endo/Heme/Allergies: Negative.   Psychiatric/Behavioral: Positive for depression. The patient is nervous/anxious.     Objective:  Physical Exam  Constitutional: She is oriented to person, place, and time. She appears well-developed and well-nourished.  HENT:  Head: Normocephalic and atraumatic.  Eyes: Pupils are equal, round, and reactive to light.  Neck: Neck supple. No JVD present. No tracheal deviation present. No  thyromegaly present.  Cardiovascular: Normal rate, regular rhythm, normal heart sounds and intact distal pulses.   Respiratory: Effort normal and breath sounds normal. No stridor. No respiratory distress. She has no wheezes.  GI: Soft. There is no tenderness. There is no guarding.  Musculoskeletal:       Left knee: She exhibits decreased range of motion, swelling and bony tenderness. She exhibits no ecchymosis, no deformity, no laceration and no erythema. Tenderness found.  Lymphadenopathy:    She has no cervical adenopathy.  Neurological: She is alert and oriented to person, place, and time.  Skin: Skin is warm and dry.  Psychiatric: She has a normal mood and affect.      Labs:  Estimated body mass index is 24.37 kg/(m^2) as calculated from the following:   Height as of 02/20/15: 5\' 6"  (1.676 m).  Weight as of 02/20/15: 68.448 kg (150 lb 14.4 oz).   Imaging Review Plain radiographs demonstrate severe degenerative joint disease of the left knee(s). The overall alignment is neutral. The bone quality appears to be good for age and reported activity level.  Assessment/Plan:  End stage arthritis, left knee   The patient history, physical examination, clinical judgment of the provider and imaging studies are consistent with end stage degenerative joint disease of the left knee(s) and total knee arthroplasty is deemed medically necessary. The treatment options including medical management, injection therapy arthroscopy and arthroplasty were discussed at length. The risks and benefits of total knee arthroplasty were presented and reviewed. The risks due to aseptic loosening, infection, stiffness, patella tracking problems, thromboembolic complications and other imponderables were discussed. The patient acknowledged the explanation, agreed to proceed with the plan and consent was signed. Patient is being admitted for inpatient treatment for surgery, pain control, PT, OT, prophylactic antibiotics,  VTE prophylaxis, progressive ambulation and ADL's and discharge planning. The patient is planning to be discharged home with home health services.    Anastasio Auerbach Dontavious Emily   PA-C  04/20/2015, 8:11 AM

## 2015-04-23 NOTE — Patient Instructions (Signed)
Jacqueline Kim  04/23/2015   Your procedure is scheduled on:   05/01/2015    Report to [facility] Main  Entrance take [facility]  elevators to 3rd floor to  Short Stay Center at      1000 AM.  Call this number if you have problems the morning of surgery [phone]   Remember: ONLY 1 PERSON MAY GO WITH YOU TO SHORT STAY TO GET  READY MORNING OF YOUR SURGERY.  Do not eat food or drink liquids :After Midnight.     Take these medicines the morning of surgery with A SIP OF WATER:   Atenolol ( Tenormin), Lexapro, Hydrocodone if needed, Rhythmol  DO NOT TAKE ANY DIABETIC MEDICATIONS DAY OF YOUR SURGERY                               You may not have any metal on your body including hair pins and              piercings  Do not wear jewelry, make-up, lotions, powders or perfumes, deodorant             Do not wear nail polish.  Do not shave  48 hours prior to surgery.                Do not bring valuables to the hospital. [facility] IS NOT             RESPONSIBLE   FOR VALUABLES.  Contacts, dentures or bridgework may not be worn into surgery.  Leave suitcase in the car. After surgery it may be brought to your room.       Special Instructions: coughing and deep breathing exercises, leg exercises               Please read over the following fact sheets you were given: _____________________________________________________________________             [facility] - Preparing for Surgery Before surgery, you can play an important role.  Because skin is not sterile, your skin needs to be as free of germs as possible.  You can reduce the number of germs on your skin by washing with CHG (chlorahexidine gluconate) soap before surgery.  CHG is an antiseptic cleaner which kills germs and bonds with the skin to continue killing germs even after washing. Please DO NOT use if you have an allergy to CHG or antibacterial soaps.  If your skin becomes reddened/irritated stop using the  CHG and inform your nurse when you arrive at Short Stay. Do not shave (including legs and underarms) for at least 48 hours prior to the first CHG shower.  You may shave your face/neck. Please follow these instructions carefully:  1.  Shower with CHG Soap the night before surgery and the  morning of Surgery.  2.  If you choose to wash your hair, wash your hair first as usual with your  normal  shampoo.  3.  After you shampoo, rinse your hair and body thoroughly to remove the  shampoo.                           4.  Use CHG as you would any other liquid soap.  You can apply chg directly  to the skin and wash  Gently with a scrungie or clean washcloth.  5.  Apply the CHG Soap to your body ONLY FROM THE NECK DOWN.   Do not use on face/ open                           Wound or open sores. Avoid contact with eyes, ears mouth and genitals (private parts).                       Wash face,  Genitals (private parts) with your normal soap.             6.  Wash thoroughly, paying special attention to the area where your surgery  will be performed.  7.  Thoroughly rinse your body with warm water from the neck down.  8.  DO NOT shower/wash with your normal soap after using and rinsing off  the CHG Soap.                9.  Pat yourself dry with a clean towel.            10.  Wear clean pajamas.            11.  Place clean sheets on your bed the night of your first shower and do not  sleep with pets. Day of Surgery : Do not apply any lotions/deodorants the morning of surgery.  Please wear clean clothes to the hospital/surgery center.  FAILURE TO FOLLOW THESE INSTRUCTIONS MAY RESULT IN THE CANCELLATION OF YOUR SURGERY PATIENT SIGNATURE_________________________________  NURSE SIGNATURE__________________________________  ________________________________________________________________________  WHAT IS A BLOOD TRANSFUSION? Blood Transfusion Information  A transfusion is the replacement of  blood or some of its parts. Blood is made up of multiple cells which provide different functions.  Red blood cells carry oxygen and are used for blood loss replacement.  White blood cells fight against infection.  Platelets control bleeding.  Plasma helps clot blood.  Other blood products are available for specialized needs, such as hemophilia or other clotting disorders. BEFORE THE TRANSFUSION  Who gives blood for transfusions?   Healthy volunteers who are fully evaluated to make sure their blood is safe. This is blood bank blood. Transfusion therapy is the safest it has ever been in the practice of medicine. Before blood is taken from a donor, a complete history is taken to make sure that person has no history of diseases nor engages in risky social behavior (examples are intravenous drug use or sexual activity with multiple partners). The donor's travel history is screened to minimize risk of transmitting infections, such as malaria. The donated blood is tested for signs of infectious diseases, such as HIV and hepatitis. The blood is then tested to be sure it is compatible with you in order to minimize the chance of a transfusion reaction. If you or a relative donates blood, this is often done in anticipation of surgery and is not appropriate for emergency situations. It takes many days to process the donated blood. RISKS AND COMPLICATIONS Although transfusion therapy is very safe and saves many lives, the main dangers of transfusion include:  1. Getting an infectious disease. 2. Developing a transfusion reaction. This is an allergic reaction to something in the blood you were given. Every precaution is taken to prevent this. The decision to have a blood transfusion has been considered carefully by your caregiver before blood is given. Blood is not given unless the benefits outweigh  the risks. AFTER THE TRANSFUSION  Right after receiving a blood transfusion, you will usually feel much  better and more energetic. This is especially true if your red blood cells have gotten low (anemic). The transfusion raises the level of the red blood cells which carry oxygen, and this usually causes an energy increase.  The nurse administering the transfusion will monitor you carefully for complications. HOME CARE INSTRUCTIONS  No special instructions are needed after a transfusion. You may find your energy is better. Speak with your caregiver about any limitations on activity for underlying diseases you may have. SEEK MEDICAL CARE IF:   Your condition is not improving after your transfusion.  You develop redness or irritation at the intravenous (IV) site. SEEK IMMEDIATE MEDICAL CARE IF:  Any of the following symptoms occur over the next 12 hours:  Shaking chills.  You have a temperature by mouth above 102 F (38.9 C), not controlled by medicine.  Chest, back, or muscle pain.  People around you feel you are not acting correctly or are confused.  Shortness of breath or difficulty breathing.  Dizziness and fainting.  You get a rash or develop hives.  You have a decrease in urine output.  Your urine turns a dark color or changes to pink, red, or brown. Any of the following symptoms occur over the next 10 days:  You have a temperature by mouth above 102 F (38.9 C), not controlled by medicine.  Shortness of breath.  Weakness after normal activity.  The white part of the eye turns yellow (jaundice).  You have a decrease in the amount of urine or are urinating less often.  Your urine turns a dark color or changes to pink, red, or brown. Document Released: 05/30/2000 Document Revised: 08/25/2011 Document Reviewed: 01/17/2008 ExitCare Patient Information 2014 [place].  _______________________________________________________________________  Incentive Spirometer  An incentive spirometer is a tool that can help keep your lungs clear and active. This tool measures  how well you are filling your lungs with each breath. Taking long deep breaths may help reverse or decrease the chance of developing breathing (pulmonary) problems (especially infection) following:  A long period of time when you are unable to move or be active. BEFORE THE PROCEDURE   If the spirometer includes an indicator to show your best effort, your nurse or respiratory therapist will set it to a desired goal.  If possible, sit up straight or lean slightly forward. Try not to slouch.  Hold the incentive spirometer in an upright position. INSTRUCTIONS FOR USE  3. Sit on the edge of your bed if possible, or sit up as far as you can in bed or on a chair. 4. Hold the incentive spirometer in an upright position. 5. Breathe out normally. 6. Place the mouthpiece in your mouth and seal your lips tightly around it. 7. Breathe in slowly and as deeply as possible, raising the piston or the ball toward the top of the column. 8. Hold your breath for 3-5 seconds or for as long as possible. Allow the piston or ball to fall to the bottom of the column. 9. Remove the mouthpiece from your mouth and breathe out normally. 10. Rest for a few seconds and repeat Steps 1 through 7 at least 10 times every 1-2 hours when you are awake. Take your time and take a few normal breaths between deep breaths. 11. The spirometer may include an indicator to show your best effort. Use the indicator as a goal to work  toward during each repetition. 12. After each set of 10 deep breaths, practice coughing to be sure your lungs are clear. If you have an incision (the cut made at the time of surgery), support your incision when coughing by placing a pillow or rolled up towels firmly against it. Once you are able to get out of bed, walk around indoors and cough well. You may stop using the incentive spirometer when instructed by your caregiver.  RISKS AND COMPLICATIONS  Take your time so you do not get dizzy or  light-headed.  If you are in pain, you may need to take or ask for pain medication before doing incentive spirometry. It is harder to take a deep breath if you are having pain. AFTER USE  Rest and breathe slowly and easily.  It can be helpful to keep track of a log of your progress. Your caregiver can provide you with a simple table to help with this. If you are using the spirometer at home, follow these instructions: [place] IF:   You are having difficultly using the spirometer.  You have trouble using the spirometer as often as instructed.  Your pain medication is not giving enough relief while using the spirometer.  You develop fever of 100.5 F (38.1 C) or higher. SEEK IMMEDIATE MEDICAL CARE IF:   You cough up bloody sputum that had not been present before.  You develop fever of 102 F (38.9 C) or greater.  You develop worsening pain at or near the incision site. MAKE SURE YOU:   Understand these instructions.  Will watch your condition.  Will get help right away if you are not doing well or get worse. Document Released: 10/13/2006 Document Revised: 08/25/2011 Document Reviewed: 12/14/2006 [facility] Patient Information 2014 [place], [place].   ________________________________________________________________________

## 2015-04-24 ENCOUNTER — Encounter (HOSPITAL_COMMUNITY)
Admission: RE | Admit: 2015-04-24 | Discharge: 2015-04-24 | Disposition: A | Discharge status: Home / Self Care | Payer: Medicare Other | Source: Ambulatory Visit | Attending: Orthopedic Surgery | Admitting: Orthopedic Surgery

## 2015-04-24 ENCOUNTER — Encounter (HOSPITAL_COMMUNITY): Payer: Self-pay

## 2015-04-24 DIAGNOSIS — Z01818 Encounter for other preprocedural examination: Secondary | ICD-10-CM | POA: Diagnosis not present

## 2015-04-24 DIAGNOSIS — Z7901 Long term (current) use of anticoagulants: Secondary | ICD-10-CM | POA: Insufficient documentation

## 2015-04-24 DIAGNOSIS — M179 Osteoarthritis of knee, unspecified: Secondary | ICD-10-CM | POA: Diagnosis not present

## 2015-04-24 LAB — CBC
HEMATOCRIT: 42.1 % (ref 36.0–46.0)
HEMOGLOBIN: 13.8 g/dL (ref 12.0–15.0)
MCH: 30.2 pg (ref 26.0–34.0)
MCHC: 32.8 g/dL (ref 30.0–36.0)
MCV: 92.1 fL (ref 78.0–100.0)
Platelets: 286 10*3/uL (ref 150–400)
RBC: 4.57 MIL/uL (ref 3.87–5.11)
RDW: 13.4 % (ref 11.5–15.5)
WBC: 4.9 10*3/uL (ref 4.0–10.5)

## 2015-04-24 LAB — BASIC METABOLIC PANEL
ANION GAP: 9 (ref 5–15)
BUN: 23 mg/dL — ABNORMAL HIGH (ref 6–20)
CALCIUM: 10.1 mg/dL (ref 8.9–10.3)
CHLORIDE: 100 mmol/L — AB (ref 101–111)
CO2: 26 mmol/L (ref 22–32)
Creatinine, Ser: 1.36 mg/dL — ABNORMAL HIGH (ref 0.44–1.00)
GFR calc Af Amer: 44 mL/min — ABNORMAL LOW (ref >60)
GFR calc non Af Amer: 38 mL/min — ABNORMAL LOW (ref >60)
GLUCOSE: 102 mg/dL — AB (ref 65–99)
Potassium: 4.2 mmol/L (ref 3.5–5.1)
Sodium: 135 mmol/L (ref 135–145)

## 2015-04-24 LAB — URINALYSIS, ROUTINE W REFLEX MICROSCOPIC
BILIRUBIN URINE: NEGATIVE
Glucose, UA: NEGATIVE mg/dL
Hgb urine dipstick: NEGATIVE
KETONES UR: NEGATIVE mg/dL
Leukocytes, UA: NEGATIVE
NITRITE: NEGATIVE
PROTEIN: NEGATIVE mg/dL
Specific Gravity, Urine: 1.013 (ref 1.005–1.030)
UROBILINOGEN UA: 0.2 mg/dL (ref 0.0–1.0)
pH: 5 (ref 5.0–8.0)

## 2015-04-24 LAB — SURGICAL PCR SCREEN
MRSA, PCR: NEGATIVE
STAPHYLOCOCCUS AUREUS: POSITIVE — AB

## 2015-04-24 LAB — PROTIME-INR
INR: 1.35 (ref 0.00–1.49)
PROTHROMBIN TIME: 16.8 s — AB (ref 11.6–15.2)

## 2015-04-24 LAB — APTT: aPTT: 34 seconds (ref 24–37)

## 2015-04-24 LAB — ABO/RH: ABO/RH(D): A POS

## 2015-04-24 NOTE — Progress Notes (Addendum)
Pt/INR results done 04/24/15 faxed via EPIC to Dr Charlann Boxer.  BMP resuilts done 04/24/15 faxed via EPIC to Dr Charlann Boxer.

## 2015-04-24 NOTE — Progress Notes (Addendum)
Clearance for surgery- DR [name] 04/17/2015 04/17/15 on chart and in EPIC  1V CXR- 12/20/14-EPIC  02/20/15- EKG_ EPIC  02/20/15- last Device check in EPIC 5/15- ECHO- EPIC  LOV with Dr Gwenlyn Saran- 02/20/15

## 2015-04-24 NOTE — Progress Notes (Signed)
At preop appointment patient was concerned over when to stop Eliquis.  I read patient the note in EPIC   per Dr Bishop Limbo on 04/17/2015 that stated " hold eliquis for at least 48 hours".  Patient stated someone told her to stop 5 days prior to surgery.  I instructed patient to call office of Dr Charlann Boxer and ask to speak to Rosalva Ferron , surgery scheduler for clarification.  Patient voiced understanding. I called and spoke with Rosalva Ferron and explained to her above.  Rosalva Ferron stated she would call daughter, Belenda Cruise , and give her the Eliquis instructions.  I told Cordelia Pen that daughter was present at preop appointment.

## 2015-04-27 ENCOUNTER — Ambulatory Visit: Payer: Medicare Other | Admitting: Gastroenterology

## 2015-05-01 ENCOUNTER — Inpatient Hospital Stay (HOSPITAL_COMMUNITY): Payer: Medicare Other | Admitting: Anesthesiology

## 2015-05-01 ENCOUNTER — Inpatient Hospital Stay (HOSPITAL_COMMUNITY)
Admission: RE | Admit: 2015-05-01 | Discharge: 2015-05-04 | DRG: 470 | Disposition: A | Discharge status: Home Health | Payer: Medicare Other | Source: Ambulatory Visit | Attending: Orthopedic Surgery | Admitting: Orthopedic Surgery

## 2015-05-01 ENCOUNTER — Encounter (HOSPITAL_COMMUNITY): Payer: Self-pay | Admitting: *Deleted

## 2015-05-01 ENCOUNTER — Encounter (HOSPITAL_COMMUNITY)
Admission: RE | Disposition: A | Discharge status: Home Health | Payer: Self-pay | Source: Ambulatory Visit | Attending: Orthopedic Surgery

## 2015-05-01 DIAGNOSIS — E039 Hypothyroidism, unspecified: Secondary | ICD-10-CM | POA: Diagnosis present

## 2015-05-01 DIAGNOSIS — I10 Essential (primary) hypertension: Secondary | ICD-10-CM | POA: Diagnosis present

## 2015-05-01 DIAGNOSIS — M1712 Unilateral primary osteoarthritis, left knee: Principal | ICD-10-CM | POA: Diagnosis present

## 2015-05-01 DIAGNOSIS — E860 Dehydration: Secondary | ICD-10-CM | POA: Diagnosis not present

## 2015-05-01 DIAGNOSIS — M659 Synovitis and tenosynovitis, unspecified: Secondary | ICD-10-CM | POA: Diagnosis present

## 2015-05-01 DIAGNOSIS — Z96652 Presence of left artificial knee joint: Secondary | ICD-10-CM

## 2015-05-01 DIAGNOSIS — Z87891 Personal history of nicotine dependence: Secondary | ICD-10-CM

## 2015-05-01 DIAGNOSIS — Z950 Presence of cardiac pacemaker: Secondary | ICD-10-CM | POA: Diagnosis not present

## 2015-05-01 DIAGNOSIS — M25562 Pain in left knee: Secondary | ICD-10-CM | POA: Diagnosis present

## 2015-05-01 DIAGNOSIS — Z7901 Long term (current) use of anticoagulants: Secondary | ICD-10-CM

## 2015-05-01 DIAGNOSIS — Z01812 Encounter for preprocedural laboratory examination: Secondary | ICD-10-CM | POA: Diagnosis not present

## 2015-05-01 DIAGNOSIS — Z96659 Presence of unspecified artificial knee joint: Secondary | ICD-10-CM

## 2015-05-01 HISTORY — PX: TOTAL KNEE ARTHROPLASTY: SHX125

## 2015-05-01 LAB — TYPE AND SCREEN
ABO/RH(D): A POS
Antibody Screen: NEGATIVE

## 2015-05-01 SURGERY — ARTHROPLASTY, KNEE, TOTAL
Anesthesia: General | Site: Knee | Laterality: Left

## 2015-05-01 MED ORDER — PROPAFENONE HCL 150 MG PO TABS
150.0000 mg | ORAL_TABLET | Freq: Every day | ORAL | Status: DC
  Administered 2015-05-01 – 2015-05-04 (×4): 150 mg via ORAL
  Filled 2015-05-01 (×5): qty 1

## 2015-05-01 MED ORDER — PROPAFENONE HCL 225 MG PO TABS
225.0000 mg | ORAL_TABLET | ORAL | Status: DC
  Administered 2015-05-01 – 2015-05-04 (×6): 225 mg via ORAL
  Filled 2015-05-01 (×9): qty 1

## 2015-05-01 MED ORDER — DEXAMETHASONE SODIUM PHOSPHATE 10 MG/ML IJ SOLN
10.0000 mg | Freq: Once | INTRAMUSCULAR | Status: AC
  Administered 2015-05-01: 10 mg via INTRAVENOUS

## 2015-05-01 MED ORDER — LACTATED RINGERS IV SOLN
INTRAVENOUS | Status: DC
  Administered 2015-05-01 (×2): via INTRAVENOUS

## 2015-05-01 MED ORDER — HYDROMORPHONE HCL 2 MG/ML IJ SOLN
INTRAMUSCULAR | Status: AC
  Filled 2015-05-01: qty 1

## 2015-05-01 MED ORDER — ZOLPIDEM TARTRATE 5 MG PO TABS: 5.0000 mg | ORAL_TABLET | Freq: Every evening | ORAL | Status: DC | PRN

## 2015-05-01 MED ORDER — ALUM & MAG HYDROXIDE-SIMETH 200-200-20 MG/5ML PO SUSP
30.0000 mL | ORAL | Status: DC | PRN
  Administered 2015-05-03: 30 mL via ORAL
  Filled 2015-05-01: qty 30

## 2015-05-01 MED ORDER — MENTHOL 3 MG MT LOZG: 1.0000 | LOZENGE | OROMUCOSAL | Status: DC | PRN

## 2015-05-01 MED ORDER — KETOROLAC TROMETHAMINE 30 MG/ML IJ SOLN
INTRAMUSCULAR | Status: DC | PRN
  Administered 2015-05-01: 30 mg via INTRAVENOUS

## 2015-05-01 MED ORDER — HYDROMORPHONE HCL 1 MG/ML IJ SOLN
INTRAMUSCULAR | Status: DC | PRN
  Administered 2015-05-01 (×2): 1 mg via INTRAVENOUS

## 2015-05-01 MED ORDER — TRAZODONE HCL 50 MG PO TABS
150.0000 mg | ORAL_TABLET | Freq: Every day | ORAL | Status: DC
  Administered 2015-05-01 – 2015-05-03 (×3): 150 mg via ORAL
  Filled 2015-05-01 (×4): qty 3

## 2015-05-01 MED ORDER — METHOCARBAMOL 500 MG PO TABS
500.0000 mg | ORAL_TABLET | Freq: Four times a day (QID) | ORAL | Status: DC | PRN
  Administered 2015-05-02 – 2015-05-03 (×5): 500 mg via ORAL
  Filled 2015-05-01 (×5): qty 1

## 2015-05-01 MED ORDER — FENTANYL CITRATE (PF) 100 MCG/2ML IJ SOLN
INTRAMUSCULAR | Status: DC | PRN
  Administered 2015-05-01: 100 ug via INTRAVENOUS
  Administered 2015-05-01 (×3): 50 ug via INTRAVENOUS

## 2015-05-01 MED ORDER — ACETAMINOPHEN 500 MG PO TABS
ORAL_TABLET | ORAL | Status: AC
  Filled 2015-05-01: qty 2

## 2015-05-01 MED ORDER — TRANEXAMIC ACID 1000 MG/10ML IV SOLN
1000.0000 mg | Freq: Once | INTRAVENOUS | Status: AC
  Administered 2015-05-01: 1000 mg via INTRAVENOUS
  Filled 2015-05-01: qty 10

## 2015-05-01 MED ORDER — FUROSEMIDE 20 MG PO TABS
20.0000 mg | ORAL_TABLET | Freq: Every day | ORAL | Status: DC | PRN
  Filled 2015-05-01 (×2): qty 1

## 2015-05-01 MED ORDER — CELECOXIB 200 MG PO CAPS
200.0000 mg | ORAL_CAPSULE | Freq: Two times a day (BID) | ORAL | Status: DC
  Administered 2015-05-01 – 2015-05-04 (×6): 200 mg via ORAL
  Filled 2015-05-01 (×8): qty 1

## 2015-05-01 MED ORDER — LABETALOL HCL 5 MG/ML IV SOLN
INTRAVENOUS | Status: DC | PRN
  Administered 2015-05-01: 5 mg via INTRAVENOUS

## 2015-05-01 MED ORDER — BUPIVACAINE HCL (PF) 0.25 % IJ SOLN
INTRAMUSCULAR | Status: DC | PRN
  Administered 2015-05-01: 30 mL

## 2015-05-01 MED ORDER — FENTANYL CITRATE (PF) 100 MCG/2ML IJ SOLN
25.0000 ug | INTRAMUSCULAR | Status: DC | PRN
  Administered 2015-05-01 (×2): 50 ug via INTRAVENOUS

## 2015-05-01 MED ORDER — METOCLOPRAMIDE HCL 10 MG PO TABS: 5.0000 mg | ORAL_TABLET | Freq: Three times a day (TID) | ORAL | Status: DC | PRN

## 2015-05-01 MED ORDER — BISACODYL 10 MG RE SUPP: 10.0000 mg | Freq: Every day | RECTAL | Status: DC | PRN

## 2015-05-01 MED ORDER — VANCOMYCIN HCL IN DEXTROSE 1-5 GM/200ML-% IV SOLN
1000.0000 mg | Freq: Two times a day (BID) | INTRAVENOUS | Status: AC
  Administered 2015-05-01: 1000 mg via INTRAVENOUS
  Filled 2015-05-01: qty 200

## 2015-05-01 MED ORDER — LABETALOL HCL 5 MG/ML IV SOLN
INTRAVENOUS | Status: AC
  Filled 2015-05-01: qty 4

## 2015-05-01 MED ORDER — KETOROLAC TROMETHAMINE 30 MG/ML IJ SOLN
INTRAMUSCULAR | Status: AC
  Filled 2015-05-01: qty 1

## 2015-05-01 MED ORDER — APIXABAN 2.5 MG PO TABS
2.5000 mg | ORAL_TABLET | Freq: Two times a day (BID) | ORAL | Status: DC
  Administered 2015-05-03 – 2015-05-04 (×3): 2.5 mg via ORAL
  Filled 2015-05-01 (×5): qty 1

## 2015-05-01 MED ORDER — BUPIVACAINE HCL (PF) 0.25 % IJ SOLN
INTRAMUSCULAR | Status: AC
  Filled 2015-05-01: qty 30

## 2015-05-01 MED ORDER — DIPHENHYDRAMINE HCL 25 MG PO CAPS
25.0000 mg | ORAL_CAPSULE | Freq: Four times a day (QID) | ORAL | Status: DC | PRN
  Administered 2015-05-01: 25 mg via ORAL
  Filled 2015-05-01 (×3): qty 1

## 2015-05-01 MED ORDER — VANCOMYCIN HCL IN DEXTROSE 1-5 GM/200ML-% IV SOLN
1000.0000 mg | INTRAVENOUS | Status: AC
  Administered 2015-05-01: 1000 mg via INTRAVENOUS
  Filled 2015-05-01: qty 200

## 2015-05-01 MED ORDER — DEXAMETHASONE SODIUM PHOSPHATE 10 MG/ML IJ SOLN
10.0000 mg | Freq: Once | INTRAMUSCULAR | Status: DC
  Filled 2015-05-01: qty 1

## 2015-05-01 MED ORDER — DILTIAZEM HCL ER COATED BEADS 120 MG PO CP24
120.0000 mg | ORAL_CAPSULE | Freq: Every day | ORAL | Status: DC
  Administered 2015-05-01 – 2015-05-03 (×3): 120 mg via ORAL
  Filled 2015-05-01 (×4): qty 1

## 2015-05-01 MED ORDER — SODIUM CHLORIDE 0.9 % IV SOLN
Freq: Once | INTRAVENOUS | Status: AC
  Administered 2015-05-01: 19:00:00 via INTRAVENOUS

## 2015-05-01 MED ORDER — PROPOFOL 10 MG/ML IV BOLUS
INTRAVENOUS | Status: AC
  Filled 2015-05-01: qty 20

## 2015-05-01 MED ORDER — DEXAMETHASONE SODIUM PHOSPHATE 10 MG/ML IJ SOLN
INTRAMUSCULAR | Status: AC
  Filled 2015-05-01: qty 1

## 2015-05-01 MED ORDER — METOCLOPRAMIDE HCL 5 MG/ML IJ SOLN: 5.0000 mg | Freq: Three times a day (TID) | INTRAMUSCULAR | Status: DC | PRN

## 2015-05-01 MED ORDER — CHLORHEXIDINE GLUCONATE 4 % EX LIQD: 60.0000 mL | Freq: Once | CUTANEOUS | Status: DC

## 2015-05-01 MED ORDER — OXYCODONE HCL 5 MG PO TABS
5.0000 mg | ORAL_TABLET | ORAL | Status: DC
  Administered 2015-05-01 – 2015-05-02 (×4): 10 mg via ORAL
  Administered 2015-05-02 (×3): 15 mg via ORAL
  Administered 2015-05-04: 10 mg via ORAL
  Filled 2015-05-01: qty 3
  Filled 2015-05-01 (×2): qty 2
  Filled 2015-05-01 (×3): qty 3
  Filled 2015-05-01: qty 2
  Filled 2015-05-01: qty 3

## 2015-05-01 MED ORDER — ACETAMINOPHEN 500 MG PO TABS
1000.0000 mg | ORAL_TABLET | Freq: Once | ORAL | Status: AC
  Administered 2015-05-01: 1000 mg via ORAL

## 2015-05-01 MED ORDER — ONDANSETRON HCL 4 MG PO TABS
4.0000 mg | ORAL_TABLET | Freq: Four times a day (QID) | ORAL | Status: DC | PRN
  Administered 2015-05-02: 4 mg via ORAL
  Filled 2015-05-01: qty 1

## 2015-05-01 MED ORDER — SODIUM CHLORIDE 0.9 % IJ SOLN
INTRAMUSCULAR | Status: AC
  Filled 2015-05-01: qty 30

## 2015-05-01 MED ORDER — LEVOTHYROXINE SODIUM 112 MCG PO TABS
112.0000 ug | ORAL_TABLET | Freq: Every day | ORAL | Status: DC
  Administered 2015-05-02 – 2015-05-04 (×3): 112 ug via ORAL
  Filled 2015-05-01 (×4): qty 1

## 2015-05-01 MED ORDER — MAGNESIUM CITRATE PO SOLN: 1.0000 | Freq: Once | ORAL | Status: DC | PRN

## 2015-05-01 MED ORDER — POLYETHYLENE GLYCOL 3350 17 G PO PACK
17.0000 g | PACK | Freq: Two times a day (BID) | ORAL | Status: DC
  Administered 2015-05-02 – 2015-05-04 (×5): 17 g via ORAL

## 2015-05-01 MED ORDER — SODIUM CHLORIDE 0.9 % IJ SOLN
INTRAMUSCULAR | Status: DC | PRN
Start: 2015-05-01 — End: 2015-05-01
  Administered 2015-05-01: 30 mL via INTRAVENOUS

## 2015-05-01 MED ORDER — ONDANSETRON HCL 4 MG/2ML IJ SOLN: 4.0000 mg | Freq: Four times a day (QID) | INTRAMUSCULAR | Status: DC | PRN

## 2015-05-01 MED ORDER — ONDANSETRON HCL 4 MG/2ML IJ SOLN: 4.0000 mg | Freq: Once | INTRAMUSCULAR | Status: DC | PRN

## 2015-05-01 MED ORDER — PROPOFOL 10 MG/ML IV BOLUS
INTRAVENOUS | Status: DC | PRN
  Administered 2015-05-01: 120 mg via INTRAVENOUS

## 2015-05-01 MED ORDER — PHENOL 1.4 % MT LIQD: 1.0000 | OROMUCOSAL | Status: DC | PRN

## 2015-05-01 MED ORDER — METHOCARBAMOL 1000 MG/10ML IJ SOLN
500.0000 mg | Freq: Four times a day (QID) | INTRAVENOUS | Status: DC | PRN
  Administered 2015-05-01: 500 mg via INTRAVENOUS
  Filled 2015-05-01 (×2): qty 5

## 2015-05-01 MED ORDER — FERROUS SULFATE 325 (65 FE) MG PO TABS
325.0000 mg | ORAL_TABLET | Freq: Three times a day (TID) | ORAL | Status: DC
  Administered 2015-05-01 – 2015-05-04 (×9): 325 mg via ORAL
  Filled 2015-05-01 (×12): qty 1

## 2015-05-01 MED ORDER — SUCCINYLCHOLINE CHLORIDE 20 MG/ML IJ SOLN
INTRAMUSCULAR | Status: DC | PRN
  Administered 2015-05-01: 80 mg via INTRAVENOUS

## 2015-05-01 MED ORDER — ATENOLOL 50 MG PO TABS
50.0000 mg | ORAL_TABLET | Freq: Every morning | ORAL | Status: DC
  Administered 2015-05-02: 50 mg via ORAL
  Filled 2015-05-01 (×2): qty 1

## 2015-05-01 MED ORDER — ESCITALOPRAM OXALATE 20 MG PO TABS
20.0000 mg | ORAL_TABLET | Freq: Every day | ORAL | Status: DC
  Administered 2015-05-02 – 2015-05-04 (×3): 20 mg via ORAL
  Filled 2015-05-01 (×3): qty 1

## 2015-05-01 MED ORDER — LOSARTAN POTASSIUM 50 MG PO TABS
50.0000 mg | ORAL_TABLET | Freq: Every day | ORAL | Status: DC
  Administered 2015-05-01 – 2015-05-04 (×2): 50 mg via ORAL
  Filled 2015-05-01 (×4): qty 1

## 2015-05-01 MED ORDER — FENTANYL CITRATE (PF) 250 MCG/5ML IJ SOLN
INTRAMUSCULAR | Status: AC
  Filled 2015-05-01: qty 25

## 2015-05-01 MED ORDER — DOCUSATE SODIUM 100 MG PO CAPS
100.0000 mg | ORAL_CAPSULE | Freq: Two times a day (BID) | ORAL | Status: DC
  Administered 2015-05-01 – 2015-05-04 (×6): 100 mg via ORAL

## 2015-05-01 MED ORDER — SODIUM CHLORIDE 0.9 % IV SOLN
INTRAVENOUS | Status: DC
  Administered 2015-05-01: 17:00:00 via INTRAVENOUS
  Filled 2015-05-01 (×8): qty 1000

## 2015-05-01 MED ORDER — DICYCLOMINE HCL 10 MG PO CAPS
10.0000 mg | ORAL_CAPSULE | Freq: Three times a day (TID) | ORAL | Status: DC
  Administered 2015-05-01 – 2015-05-04 (×9): 10 mg via ORAL
  Filled 2015-05-01 (×12): qty 1

## 2015-05-01 MED ORDER — FENTANYL CITRATE (PF) 100 MCG/2ML IJ SOLN
INTRAMUSCULAR | Status: AC
  Filled 2015-05-01: qty 2

## 2015-05-01 MED ORDER — HYDROMORPHONE HCL 1 MG/ML IJ SOLN
0.5000 mg | INTRAMUSCULAR | Status: DC | PRN
  Administered 2015-05-01: 2 mg via INTRAVENOUS
  Administered 2015-05-01: 1 mg via INTRAVENOUS
  Filled 2015-05-01: qty 2
  Filled 2015-05-01 (×2): qty 1

## 2015-05-01 SURGICAL SUPPLY — 49 items
BAG DECANTER FOR FLEXI CONT (MISCELLANEOUS) IMPLANT
BAG ZIPLOCK 12X15 (MISCELLANEOUS) IMPLANT
BANDAGE ELASTIC 6 VELCRO ST LF (GAUZE/BANDAGES/DRESSINGS) ×3 IMPLANT
BLADE SAW SGTL 13.0X1.19X90.0M (BLADE) ×3 IMPLANT
BOWL SMART MIX CTS (DISPOSABLE) ×3 IMPLANT
CAPT KNEE TOTAL 3 ATTUNE ×3 IMPLANT
CEMENT HV SMART SET (Cement) ×6 IMPLANT
CLOTH BEACON ORANGE TIMEOUT ST (SAFETY) ×3 IMPLANT
CUFF TOURN SGL QUICK 34 (TOURNIQUET CUFF) ×2
CUFF TRNQT CYL 34X4X40X1 (TOURNIQUET CUFF) ×1 IMPLANT
DECANTER SPIKE VIAL GLASS SM (MISCELLANEOUS) IMPLANT
DRAPE U-SHAPE 47X51 STRL (DRAPES) ×3 IMPLANT
DRSG AQUACEL AG ADV 3.5X10 (GAUZE/BANDAGES/DRESSINGS) ×3 IMPLANT
DRSG TEGADERM 4X4.75 (GAUZE/BANDAGES/DRESSINGS) ×3 IMPLANT
DURAPREP 26ML APPLICATOR (WOUND CARE) ×6 IMPLANT
ELECT REM PT RETURN 9FT ADLT (ELECTROSURGICAL) ×3
ELECTRODE REM PT RTRN 9FT ADLT (ELECTROSURGICAL) ×1 IMPLANT
EVACUATOR 1/8 PVC DRAIN (DRAIN) ×3 IMPLANT
GAUZE SPONGE 2X2 8PLY STRL LF (GAUZE/BANDAGES/DRESSINGS) ×1 IMPLANT
GLOVE BIOGEL M 7.0 STRL (GLOVE) IMPLANT
GLOVE BIOGEL M STRL SZ7.5 (GLOVE) IMPLANT
GLOVE BIOGEL PI IND STRL 7.5 (GLOVE) ×1 IMPLANT
GLOVE BIOGEL PI IND STRL 8.5 (GLOVE) ×1 IMPLANT
GLOVE BIOGEL PI INDICATOR 7.5 (GLOVE) ×2
GLOVE BIOGEL PI INDICATOR 8.5 (GLOVE) ×2
GLOVE ECLIPSE 8.0 STRL XLNG CF (GLOVE) ×3 IMPLANT
GLOVE ORTHO TXT STRL SZ7.5 (GLOVE) ×6 IMPLANT
GOWN STRL REUS W/TWL LRG LVL3 (GOWN DISPOSABLE) ×3 IMPLANT
GOWN STRL REUS W/TWL XL LVL3 (GOWN DISPOSABLE) IMPLANT
HANDPIECE INTERPULSE COAX TIP (DISPOSABLE) ×2
LIQUID BAND (GAUZE/BANDAGES/DRESSINGS) ×3 IMPLANT
MANIFOLD NEPTUNE II (INSTRUMENTS) ×3 IMPLANT
PACK TOTAL KNEE CUSTOM (KITS) ×3 IMPLANT
POSITIONER SURGICAL ARM (MISCELLANEOUS) ×3 IMPLANT
SET HNDPC FAN SPRY TIP SCT (DISPOSABLE) ×1 IMPLANT
SET PAD KNEE POSITIONER (MISCELLANEOUS) ×3 IMPLANT
SPONGE GAUZE 2X2 STER 10/PKG (GAUZE/BANDAGES/DRESSINGS) ×2
SUCTION FRAZIER 12FR DISP (SUCTIONS) ×3 IMPLANT
SUT MNCRL AB 4-0 PS2 18 (SUTURE) ×3 IMPLANT
SUT VIC AB 1 CT1 36 (SUTURE) ×3 IMPLANT
SUT VIC AB 2-0 CT1 27 (SUTURE) ×6
SUT VIC AB 2-0 CT1 TAPERPNT 27 (SUTURE) ×3 IMPLANT
SUT VLOC 180 0 24IN GS25 (SUTURE) ×3 IMPLANT
SYR 50ML LL SCALE MARK (SYRINGE) IMPLANT
TRAY FOLEY W/METER SILVER 14FR (SET/KITS/TRAYS/PACK) IMPLANT
TRAY FOLEY W/METER SILVER 16FR (SET/KITS/TRAYS/PACK) ×3 IMPLANT
WATER STERILE IRR 1500ML POUR (IV SOLUTION) ×3 IMPLANT
WRAP KNEE MAXI GEL POST OP (GAUZE/BANDAGES/DRESSINGS) ×3 IMPLANT
YANKAUER SUCT BULB TIP 10FT TU (MISCELLANEOUS) ×3 IMPLANT

## 2015-05-01 NOTE — Anesthesia Preprocedure Evaluation (Signed)
Anesthesia Evaluation  Patient identified by MRN, date of birth, ID band Patient awake    Reviewed: Allergy & Precautions, NPO status , Patient's Chart, lab work & pertinent test results, reviewed documented beta blocker date and time   History of Anesthesia Complications (+) history of anesthetic complications  Airway Mallampati: II  TM Distance: >3 FB Neck ROM: Full    Dental  (+) Teeth Intact, Dental Advisory Given, Caps   Pulmonary former smoker,    Pulmonary exam normal breath sounds clear to auscultation       Cardiovascular hypertension, Pt. on medications and Pt. on home beta blockers Normal cardiovascular exam+ pacemaker  Rhythm:Regular Rate:Normal     Neuro/Psych PSYCHIATRIC DISORDERS Anxiety Depression    GI/Hepatic negative GI ROS, Neg liver ROS, GERD  ,  Endo/Other  Hypothyroidism   Renal/GU negative Renal ROS     Musculoskeletal  (+) Arthritis , Osteoarthritis,    Abdominal   Peds  Hematology negative hematology ROS (+)   Anesthesia Other Findings Day of surgery medications reviewed with the patient.  Reproductive/Obstetrics                             Anesthesia Physical Anesthesia Plan  ASA: II  Anesthesia Plan: General   Post-op Pain Management:    Induction: Intravenous  Airway Management Planned: LMA  Additional Equipment:   Intra-op Plan:   Post-operative Plan: Extubation in OR  Informed Consent: I have reviewed the patients History and Physical, chart, labs and discussed the procedure including the risks, benefits and alternatives for the proposed anesthesia with the patient or authorized representative who has indicated his/her understanding and acceptance.   Dental advisory given  Plan Discussed with: CRNA  Anesthesia Plan Comments: (Risks/benefits of general anesthesia discussed with patient including risk of damage to teeth, lips, gum, and tongue,  nausea/vomiting, allergic reactions to medications, and the possibility of heart attack, stroke and death.  All patient questions answered.  Patient wishes to proceed.)        Anesthesia Quick Evaluation

## 2015-05-01 NOTE — Discharge Instructions (Signed)

## 2015-05-01 NOTE — Anesthesia Procedure Notes (Signed)
Procedure Name: Intubation Date/Time: 05/01/2015 12:33 PM Performed by: Paris Lore Pre-anesthesia Checklist: Patient identified, Emergency Drugs available, Suction available, Patient being monitored and Timeout performed Patient Re-evaluated:Patient Re-evaluated prior to inductionOxygen Delivery Method: Circle system utilized Preoxygenation: Pre-oxygenation with 100% oxygen Intubation Type: IV induction Ventilation: Mask ventilation without difficulty Laryngoscope Size: Mac and 4 Grade View: Grade I Tube type: Oral Tube size: 7.5 mm Number of attempts: 1 Airway Equipment and Method: Stylet Placement Confirmation: ETT inserted through vocal cords under direct vision,  positive ETCO2,  CO2 detector and breath sounds checked- equal and bilateral Secured at: 19 cm Tube secured with: Tape Dental Injury: Teeth and Oropharynx as per pre-operative assessment

## 2015-05-01 NOTE — Progress Notes (Signed)
Utilization review completed.  

## 2015-05-01 NOTE — Progress Notes (Signed)
Per order obtained from Alphonsa Overall PA for Dr. Ranell Patrick - Normal Saline bolus 250cc started at 250cc/hr.  Pt. Tolerating infusion well.

## 2015-05-01 NOTE — Anesthesia Postprocedure Evaluation (Signed)
  Anesthesia Post-op Note  Patient: Jacqueline Kim  Procedure(s) Performed: Procedure(s) (LRB): TOTAL LEFT KNEE ARTHROPLASTY (Left)  Patient Location: PACU  Anesthesia Type: General  Level of Consciousness: awake and alert   Airway and Oxygen Therapy: Patient Spontanous Breathing  Post-op Pain: mild  Post-op Assessment: Post-op Vital signs reviewed, Patient's Cardiovascular Status Stable, Respiratory Function Stable, Patent Airway and No signs of Nausea or vomiting  Last Vitals:  Filed Vitals:   05/01/15 1515  BP: 96/68  Pulse: 61  Temp:   Resp:     Post-op Vital Signs: stable   Complications: No apparent anesthesia complications

## 2015-05-01 NOTE — Interval H&P Note (Signed)
History and Physical Interval Note:  05/01/2015 11:19 AM  Jacqueline Kim  has presented today for surgery, with the diagnosis of LEFT KNEE OA   The various methods of treatment have been discussed with the patient and family. After consideration of risks, benefits and other options for treatment, the patient has consented to  Procedure(s): TOTAL LEFT KNEE ARTHROPLASTY (Left) as a surgical intervention .  The patient's history has been reviewed, patient examined, no change in status, stable for surgery.  I have reviewed the patient's chart and labs.  Questions were answered to the patient's satisfaction.     Shelda Pal

## 2015-05-01 NOTE — Op Note (Signed)
NAME:  Jacqueline Kim                      MEDICAL RECORD NO.:  [id number]                             FACILITY:  [facility]      PHYSICIAN:  Madlyn Frankel. Charlann Boxer, M.D.  DATE OF BIRTH:  06/24/1945      DATE OF PROCEDURE:  05/01/2015                                     OPERATIVE REPORT         PREOPERATIVE DIAGNOSIS:  Left knee osteoarthritis.      POSTOPERATIVE DIAGNOSIS:  Left knee osteoarthritis.      FINDINGS:  The patient was noted to have complete loss of cartilage and   bone-on-bone arthritis with associated osteophytes in the all three compartments of   the knee with a significant synovitis and associated large effusion.      PROCEDURE:  Left total knee replacement.      COMPONENTS USED:  DePuy Attune rotating platform posterior stabilized knee   system, a size 6N femur, 5 tibia, size 5 mm AOX PS insert, and 35 anatomic patellar   button.      SURGEON:  Madlyn Frankel. Charlann Boxer, M.D.      ASSISTANT:  Lanney Gins, PA-C.      ANESTHESIA:  General.      SPECIMENS:  None.      COMPLICATION:  None.      DRAINS:  One hemovac.  EBL: 300cc      TOURNIQUET TIME:   Total Tourniquet Time Documented: Thigh (Left) - 29 minutes Total: Thigh (Left) - 29 minutes     The patient was stable to the recovery room.      INDICATION FOR PROCEDURE:  Jacqueline Kim is a 69 y.o. female patient of   mine.  The patient had been seen, evaluated, and treated conservatively in the   office with medication, activity modification, and injections.  The patient had   radiographic changes of bone-on-bone arthritis with endplate sclerosis and osteophytes noted.      The patient failed conservative measures including medication, injections, and activity modification, and at this point was ready for more definitive measures.   Based on the radiographic changes and failed conservative measures, the patient   decided to proceed with total knee replacement.  Risks of infection,   DVT, component failure, need for  revision surgery, postop course, and   expectations were all   discussed and reviewed.  Consent was obtained for benefit of pain   relief.      PROCEDURE IN DETAIL:  The patient was brought to the operative theater.   Once adequate anesthesia, preoperative antibiotics, 1 gm of Vancmycin, 1 gm of Tranexamic Acid, and 10 mg of Decadron administered, the patient was positioned supine with the left thigh tourniquet placed.  The  left lower extremity was prepped and draped in sterile fashion.  A time-   out was performed identifying the patient, planned procedure, and   extremity.      The left lower extremity was placed in the [facility] leg holder.  The leg was   exsanguinated, tourniquet elevated to 250 mmHg.  A midline incision was   made followed by  median parapatellar arthrotomy.  She had a very large effusion and significant synovial hypertrophic proliferation.  Following initial   exposure, attention was first directed to the patella.  Precut   measurement was noted to be 22 mm.  I resected down to 14 mm and used a   35 patellar button to restore patellar height as well as cover the cut   surface.      The lug holes were drilled and a metal shim was placed to protect the   patella from retractors and saw blades.      At this point, attention was now directed to the femur.  The femoral   canal was opened with a drill, irrigated to try to prevent fat emboli.  An   intramedullary rod was passed at 3 degrees valgus, 9 mm of bone was   resected off the distal femur.  Following this resection, the tibia was   subluxated anteriorly.  Using the extramedullary guide, 2 mm of bone was resected off   the proximal medial tibia.  We confirmed the gap would be   stable medially and laterally with a size 5 mm insert as well as confirmed   the cut was perpendicular in the coronal plane, checking with an alignment rod.      Once this was done, I sized the femur to be a size 6 in the anterior-    posterior dimension, chose a narrow component based on medial and   lateral dimension.  The size 6 rotation block was then pinned in   position anterior referenced using the C-clamp to set rotation.  The   anterior, posterior, and  chamfer cuts were made without difficulty nor   notching making certain that I was along the anterior cortex to help   with flexion gap stability.      The final box cut was made off the lateral aspect of distal femur.      At this point, the tibia was sized to be a size 5, the size 5 tray was   then pinned in position through the medial third of the tubercle,   drilled, and keel punched.  Trial reduction was now carried with a 6 femur,  5 tibia, a size 5 mm PS insert, and the 35 patella botton.  The knee was brought to   extension, full extension with good flexion stability with the patella   tracking through the trochlea without application of pressure.  Given   all these findings, the trial components removed.  Final components were   opened and cement was mixed.  The knee was irrigated with normal saline   solution and pulse lavage.  The synovial lining was   then injected with 30 cc of 0.25% Marcaine with epinephrine and 1 cc of Toradol plus 30 cc of NS for a   total of 61 cc.      The knee was irrigated.  Final implants were then cemented onto clean and   dried cut surfaces of bone with the knee brought to extension with a size 5 mm trial insert.      Once the cement had fully cured, the excess cement was removed   throughout the knee.  I confirmed I was satisfied with the range of   motion and stability, and the final size 5 mm PS AOX insert was chosen.  It was   placed into the knee.      The tourniquet had been let down at  29 minutes.  No significant   hemostasis required.  The medium Hemovac drain was placed deep due to a persistent ooze from the synovial debridement.  The   extensor mechanism was then reapproximated using #1 Vicryl with the knee    in flexion.  The   remaining wound was closed with 2-0 Vicryl and running 4-0 Monocryl.   The knee was cleaned, dried, dressed sterilely using Dermabond and   Aquacel dressing.  Drain site dressed separately.  The patient was then   brought to recovery room in stable condition, tolerating the procedure   well.   Please note that Physician Assistant, Lanney Gins, PA-C, was present for the entirety of the case, and was utilized for pre-operative positioning, peri-operative retractor management, general facilitation of the procedure.  He was also utilized for primary wound closure at the end of the case.              Madlyn Frankel Charlann Boxer, M.D.    05/01/2015 2:05 PM

## 2015-05-01 NOTE — Progress Notes (Signed)
Patient is alert and oriented, respirations WNL, color good, no obvious distress noted.  BP low 80/54 manually.  Phone call to ortho on call, Dr. Ranell Patrick, left message for return call ASAP.

## 2015-05-01 NOTE — Transfer of Care (Signed)
Immediate Anesthesia Transfer of Care Note  Patient: Jacqueline Kim  Procedure(s) Performed: Procedure(s): TOTAL LEFT KNEE ARTHROPLASTY (Left)  Patient Location: PACU  Anesthesia Type:General  Level of Consciousness:  sedated, patient cooperative and responds to stimulation  Airway & Oxygen Therapy:Patient Spontanous Breathing and Patient connected to face mask oxgen  Post-op Assessment:  Report given to PACU RN and Post -op Vital signs reviewed and stable  Post vital signs:  Reviewed and stable  Last Vitals:  Filed Vitals:   05/01/15 1005  BP: 145/67  Pulse: 63  Temp: 36.4 C  Resp: 18    Complications: No apparent anesthesia complications

## 2015-05-02 ENCOUNTER — Encounter (HOSPITAL_COMMUNITY): Payer: Self-pay | Admitting: Orthopedic Surgery

## 2015-05-02 LAB — BASIC METABOLIC PANEL
Anion gap: 6 (ref 5–15)
BUN: 22 mg/dL — ABNORMAL HIGH (ref 6–20)
CO2: 23 mmol/L (ref 22–32)
Calcium: 9.3 mg/dL (ref 8.9–10.3)
Chloride: 101 mmol/L (ref 101–111)
Creatinine, Ser: 1.16 mg/dL — ABNORMAL HIGH (ref 0.44–1.00)
GFR calc Af Amer: 54 mL/min — ABNORMAL LOW (ref >60)
GFR calc non Af Amer: 46 mL/min — ABNORMAL LOW (ref >60)
Glucose, Bld: 157 mg/dL — ABNORMAL HIGH (ref 65–99)
Potassium: 4.9 mmol/L (ref 3.5–5.1)
Sodium: 130 mmol/L — ABNORMAL LOW (ref 135–145)

## 2015-05-02 LAB — CBC
HCT: 30.8 % — ABNORMAL LOW (ref 36.0–46.0)
Hemoglobin: 9.9 g/dL — ABNORMAL LOW (ref 12.0–15.0)
MCH: 30 pg (ref 26.0–34.0)
MCHC: 32.1 g/dL (ref 30.0–36.0)
MCV: 93.3 fL (ref 78.0–100.0)
PLATELETS: 177 10*3/uL (ref 150–400)
RBC: 3.3 MIL/uL — AB (ref 3.87–5.11)
RDW: 13.5 % (ref 11.5–15.5)
WBC: 11 10*3/uL — ABNORMAL HIGH (ref 4.0–10.5)

## 2015-05-02 MED ORDER — TRAMADOL HCL 50 MG PO TABS
50.0000 mg | ORAL_TABLET | Freq: Four times a day (QID) | ORAL | Status: DC | PRN
  Administered 2015-05-02: 50 mg via ORAL
  Administered 2015-05-03: 100 mg via ORAL
  Administered 2015-05-03: 50 mg via ORAL
  Administered 2015-05-03: 100 mg via ORAL
  Filled 2015-05-02 (×2): qty 1
  Filled 2015-05-02 (×2): qty 2

## 2015-05-02 MED ORDER — ACETAMINOPHEN 325 MG PO TABS
325.0000 mg | ORAL_TABLET | Freq: Four times a day (QID) | ORAL | Status: DC | PRN
  Administered 2015-05-02 – 2015-05-04 (×4): 650 mg via ORAL
  Filled 2015-05-02 (×4): qty 2

## 2015-05-02 NOTE — Evaluation (Signed)
Physical Therapy Evaluation Patient Details Name: Jacqueline Kim MRN: [id number] DOB: Mar 31, 1946 Today's Date: 05/02/2015   History of Present Illness  L TKR  Clinical Impression  Pt s/p L TKR presents with decreased L LE strength/ROM and post op pain limiting functional mobility.  Pt should progress to dc home with family assist and HHPT follow up.    Follow Up Recommendations Home health PT    Equipment Recommendations  Rolling walker with 5" wheels    Recommendations for Other Services OT consult     Precautions / Restrictions Precautions Precautions: Knee;Fall Restrictions Weight Bearing Restrictions: No Other Position/Activity Restrictions: WBAT      Mobility  Bed Mobility Overal bed mobility: Needs Assistance Bed Mobility: Sit to Supine       Sit to supine: Min assist   General bed mobility comments: cues for sequence and min assist for L LE management  Transfers Overall transfer level: Needs assistance Equipment used: Rolling walker (2 wheeled) Transfers: Sit to/from Stand Sit to Stand: Min assist         General transfer comment: cues for LE management and use of UEs to self assist  Ambulation/Gait Ambulation/Gait assistance: Min assist Ambulation Distance (Feet): 56 Feet Assistive device: Rolling walker (2 wheeled) Gait Pattern/deviations: Step-to pattern;Decreased step length - right;Decreased step length - left;Shuffle;Trunk flexed Gait velocity: cues to slow down for saftey   General Gait Details: cues for sequence, posture and position from [employer]            Wheelchair Mobility    Modified Rankin (Stroke Patients Only)       Balance                                             Pertinent Vitals/Pain Pain Assessment: 0-10 Pain Score: 5  Pain Location: L knee Pain Descriptors / Indicators: Sore Pain Intervention(s): Limited activity within patient's tolerance;Monitored during session;Premedicated before  session;Ice applied    Home Living Family/patient expects to be discharged to:: Private residence Living Arrangements: Alone Available Help at Discharge: Family;Available 24 hours/day Type of Home: Apartment Home Access: Stairs to enter Entrance Stairs-Rails: Right Entrance Stairs-Number of Steps: 15 Home Layout: One level Home Equipment: Cane - single point;Crutches      Prior Function Level of Independence: Independent with assistive device(s);Independent               Hand Dominance        Extremity/Trunk Assessment   Upper Extremity Assessment: Overall WFL for tasks assessed           Lower Extremity Assessment: LLE deficits/detail   LLE Deficits / Details: 3-/5 quads with AAROM at knee -10 - 70     Communication   Communication: No difficulties  Cognition Arousal/Alertness: Awake/alert Behavior During Therapy: WFL for tasks assessed/performed Overall Cognitive Status: Within Functional Limits for tasks assessed                      General Comments      Exercises Total Joint Exercises Ankle Circles/Pumps: AROM;Both;10 reps;Supine Quad Sets: AROM;Both;5 reps;Supine Heel Slides: AAROM;10 reps;Left;Supine Straight Leg Raises: Left;AROM;10 reps;Supine      Assessment/Plan    PT Assessment Patient needs continued PT services  PT Diagnosis Difficulty walking   PT Problem List Decreased strength;Decreased range of motion;Decreased activity tolerance;Decreased mobility;Decreased knowledge of use of  DME;Pain  PT Treatment Interventions DME instruction;Gait training;Stair training;Functional mobility training;Therapeutic activities;Therapeutic exercise;Patient/family education   PT Goals (Current goals can be found in the Care Plan section) Acute Rehab PT Goals Patient Stated Goal: walk without pain PT Goal Formulation: With patient Time For Goal Achievement: 05/05/15 Potential to Achieve Goals: Good    Frequency 7X/week   Barriers to  discharge        Co-evaluation               End of Session Equipment Utilized During Treatment: Gait belt Activity Tolerance: Patient tolerated treatment well;Patient limited by fatigue Patient left: in bed;with call bell/phone within reach Nurse Communication: Mobility status         Time: 8828-0034 PT Time Calculation (min) (ACUTE ONLY): 30 min   Charges:   PT Evaluation $Initial PT Evaluation Tier I: 1 Procedure PT Treatments $Therapeutic Exercise: 8-22 mins   PT G Codes:        Jacqueline Kim 2015-05-30, 12:52 PM

## 2015-05-02 NOTE — Progress Notes (Signed)
OT Cancellation Note  Patient Details Name: Jacqueline Kim MRN: [id number] DOB: Jan 06, 1946   Cancelled Treatment:    Reason Eval/Treat Not Completed: Other (comment) Pt back in bed eating lunch . Pt wants to wait until later to get OOB.   Will see pt later in day or in the morning. Pt has to get up 15 stairs into Condo.    Lise Auer, [place] [phone]  Einar Crow D 05/02/2015, 1:15 PM

## 2015-05-02 NOTE — Care Management Note (Signed)
Case Management Note  Patient Details  Name: Jacqueline Kim MRN: [id number] Date of Birth: Apr 09, 1946  Subjective/Objective:                  TOTAL LEFT KNEE ARTHROPLASTY (Left) Action/Plan: Discharge planning Expected Discharge Date:  05/03/15               Expected Discharge Plan:  [place]  In-House Referral:     Discharge planning Services  CM Consult  Post Acute Care Choice:  Home Health Choice offered to:  Patient  DME Arranged:  3-N-1, Walker rolling DME Agency:  Agency:  [facility] [place] Agency:  [place]  Status of Service:  Completed, signed off  Medicare Important Message Given:    Date Medicare IM Given:    Medicare IM give by:    Date Additional Medicare IM Given:    Additional Medicare Important Message give by:     If discussed at [place] of Stay Meetings, dates discussed:    Additional Comments: CM met with pt to offer choice of home health agency.  Pt chooses [facility] to render HHPT.  Referral given to [facility] rep, Tim (on unit).  Cm called [facility] DME rep, Lecretia to please deliver the 3n1 and rolling walker to room.  No other CM needs were communicated. Dellie Catholic, RN 05/02/2015, 3:44 PM

## 2015-05-02 NOTE — Progress Notes (Signed)
      Subjective:  1 Day Post-Op Procedure(s) (LRB): TOTAL LEFT KNEE ARTHROPLASTY (Left)   Patient reports pain as mild, pain controlled. No events throughout the night. Weary about working with PT.  Objective:   VITALS:   Filed Vitals:   05/02/15 0428  BP: 127/62  Pulse: 68  Temp: 98 F (36.7 C)  Resp: 16    Dorsiflexion/Plantar flexion intact Incision: dressing C/D/I No cellulitis present Compartment soft  LABS  Recent Labs  05/02/15 0450  HGB 9.9*  HCT 30.8*  WBC 11.0*  PLT 177     Recent Labs  05/02/15 0450  NA 130*  K 4.9  BUN 22*  CREATININE 1.16*  GLUCOSE 157*     Assessment/Plan: 1 Day Post-Op Procedure(s) (LRB): TOTAL LEFT KNEE ARTHROPLASTY (Left) Foley cath d/c'ed Advance diet Up with therapy D/C IV fluids Discharge home with home health  Post-op Dehydration IV site was lost and she is a difficult stick. Encourage fluid intake and will monitor     Anastasio Auerbach. Bertil Brickey   PAC  05/02/2015, 8:51 AM

## 2015-05-02 NOTE — Progress Notes (Signed)
Physical Therapy Treatment Patient Details Name: Jacqueline Kim MRN: [id number] DOB: 1946-02-22 Today's Date: 05/02/2015    History of Present Illness L TKR    PT Comments    Patient getting OOB  without assistance this PM. Cues for safety and to cal for assistance reiterated. Patient declined a second walk.    Follow Up Recommendations  Home health PT;Supervision/Assistance - 24 hour     Equipment Recommendations  Rolling walker with 5" wheels    Recommendations for Other Services       Precautions / Restrictions Precautions Precautions: Fall    Mobility  Bed Mobility               General bed mobility comments: OOB by self  Transfers Overall transfer level: Needs assistance Equipment used: Rolling walker (2 wheeled) Transfers: Sit to/from [employer] Transfers   Stand pivot transfers: Min assist       General transfer comment: cues for LE management and use of UEs to self assist, multimodal cues for safety,   Ambulation/Gait             General Gait Details: declined to ambulate   Stairs            Wheelchair Mobility    Modified Rankin (Stroke Patients Only)       Balance                                    Cognition   Behavior During Therapy: Impulsive;Restless Overall Cognitive Status: Impaired/Different from baseline Area of Impairment: Attention;Safety/judgement;Problem solving         Safety/Judgement: Decreased awareness of safety     General Comments: PATIENT ASSISTED SELF UP FROM BED,    Exercises Total Joint Exercises Ankle Circles/Pumps: AROM;Both;10 reps;Supine Quad Sets: AROM;Both;Supine;10 reps Towel Squeeze: AROM;Both;10 reps;Supine Short Arc Quad: AROM;Left;10 reps;Supine Heel Slides: AAROM;10 reps;Left;Supine Straight Leg Raises: AROM;Left;10 reps;Supine Goniometric ROM: 10-50    General Comments        Pertinent Vitals/Pain Pain Score: 5  Pain Location: L knee, patient  does not rate, obtaINED LEVEL VIA QUESTIONS Pain Intervention(s): Limited activity within patient's tolerance    Home Living                      Prior Function            PT Goals (current goals can now be found in the care plan section) Progress towards PT goals: Progressing toward goals    Frequency  7X/week    PT Plan Current plan remains appropriate    Co-evaluation             End of Session   Activity Tolerance: Patient tolerated treatment well;Patient limited by fatigue Patient left: in chair;with call bell/phone within reach;with chair alarm set     Time: 1428-1501 PT Time Calculation (min) (ACUTE ONLY): 33 min  Charges:  $Therapeutic Exercise: 8-22 mins $Therapeutic Activity: 8-22 mins                    G Codes:      Sharen Heck PT [phone]  05/02/2015, 3:56 PM

## 2015-05-03 LAB — CBC
HCT: 26.3 % — ABNORMAL LOW (ref 36.0–46.0)
HEMOGLOBIN: 9 g/dL — AB (ref 12.0–15.0)
MCH: 31.8 pg (ref 26.0–34.0)
MCHC: 34.2 g/dL (ref 30.0–36.0)
MCV: 92.9 fL (ref 78.0–100.0)
Platelets: 221 10*3/uL (ref 150–400)
RBC: 2.83 MIL/uL — AB (ref 3.87–5.11)
RDW: 13.5 % (ref 11.5–15.5)
WBC: 7.1 10*3/uL (ref 4.0–10.5)

## 2015-05-03 LAB — BASIC METABOLIC PANEL
ANION GAP: 7 (ref 5–15)
BUN: 23 mg/dL — AB (ref 6–20)
CHLORIDE: 97 mmol/L — AB (ref 101–111)
CO2: 24 mmol/L (ref 22–32)
Calcium: 8.7 mg/dL — ABNORMAL LOW (ref 8.9–10.3)
Creatinine, Ser: 1.22 mg/dL — ABNORMAL HIGH (ref 0.44–1.00)
GFR calc Af Amer: 50 mL/min — ABNORMAL LOW (ref >60)
GFR, EST NON AFRICAN AMERICAN: 43 mL/min — AB (ref >60)
Glucose, Bld: 114 mg/dL — ABNORMAL HIGH (ref 65–99)
POTASSIUM: 4.3 mmol/L (ref 3.5–5.1)
SODIUM: 128 mmol/L — AB (ref 135–145)

## 2015-05-03 MED ORDER — ATENOLOL 50 MG PO TABS
50.0000 mg | ORAL_TABLET | Freq: Every morning | ORAL | Status: DC
  Administered 2015-05-04: 50 mg via ORAL
  Filled 2015-05-03: qty 1

## 2015-05-03 NOTE — Clinical Documentation Improvement (Signed)
Orthopedic  Abnormal Lab/Test Results:   Component     Latest Ref Rng 05/02/2015 05/03/2015           BUN     6 - 20 mg/dL 22 (H) 23 (H)  Creatinine     0.44 - 1.00 mg/dL 1.16 (H) 1.22 (H)      EGFR (Non-African Amer.)     >60 mL/min 46 (L) 43 (L)    Possible Clinical Conditions associated with below indicators  Acute renal failure  Acute Kidney Injury  Dehydration  Other Condition  Cannot Clinically Determine   Supporting Information:   S/P L TKR  11/15 RN progress notes: Patient is alert and oriented, respirations WNL, color good, no obvious distress noted. BP low 80/54 manually. Phone call to ortho on call, Dr. Veverly Fells, left message for return call ASAP.          Per order obtained from Merla Riches PA for Dr. Veverly Fells - Normal Saline bolus 250cc started at 250cc/hr. Pt. Tolerating infusion well.   Treatment Provided: IV NS 259m bolus I&O qshift    Please exercise your independent, professional judgment when responding. A specific answer is not anticipated or expected.   Thank You,  [phone]

## 2015-05-03 NOTE — Clinical Documentation Improvement (Signed)
Orthopedic  Abnormal Lab/Test Results:  Component     Latest Ref Rng 05/02/2015 05/03/2015           Sodium     135 - 145 mmol/L 130 (L) 128 (L)    Possible Clinical Conditions associated with below indicators   Hyponatremia  Other Condition  Cannot Clinically Determine   Supporting Information: Supporting Information:  S/P L TKR  11/15 RN progress notes: Patient is alert and oriented, respirations WNL, color good, no obvious distress noted. BP low 80/54 manually. Phone call to ortho on call, Dr. Ranell Patrick, left message for return call ASAP.         Per order obtained from Alphonsa Overall PA for Dr. Ranell Patrick - Normal Saline bolus 250cc started at 250cc/hr. Pt. Tolerating infusion well.   Treatment Provided: IV NS bolus I&O qshift       Please exercise your independent, professional judgment when responding. A specific answer is not anticipated or expected.   Thank You,  Harless Litten [employer] Information Management [place] [phone]

## 2015-05-03 NOTE — Evaluation (Signed)
Occupational Therapy Evaluation Patient Details Name: Jacqueline Kim MRN: [id number] DOB: Nov 13, 1945 Today's Date: 05/03/2015    History of Present Illness L TKR   Clinical Impression   Pt is s/p TKA resulting in the deficits listed below (see OT Problem List).  Pt will benefit from skilled OT to increase their safety and independence with ADL and functional mobility for ADL to facilitate discharge to venue listed below.        Follow Up Recommendations  Home health OT;Supervision/Assistance - 24 hour - pt confused upon eval . RN aware  Equipment Recommendations  None recommended by OT       Precautions / Restrictions Precautions Precautions: Knee;Fall Restrictions Weight Bearing Restrictions: No Other Position/Activity Restrictions: WBAT      Mobility Bed Mobility Overal bed mobility: Needs Assistance Bed Mobility: Sit to Supine       Sit to supine: Mod assist   General bed mobility comments: cues for sequence and min assist for L LE management  Transfers Overall transfer level: Needs assistance Equipment used: Rolling walker (2 wheeled)   Sit to Stand: Min assist;Mod assist Stand pivot transfers: Min assist       General transfer comment: cues for LE management and use of UEs to self assist         ADL Overall ADL's : Needs assistance/impaired Eating/Feeding: Supervision/ safety;Sitting   Grooming: Sitting;Wash/dry face   Upper Body Bathing: Supervision/ safety;Sitting   Lower Body Bathing: Maximal assistance;Sit to/from stand   Upper Body Dressing : Set up;Sitting   Lower Body Dressing: Maximal assistance;Sit to/from stand   Toilet Transfer: Moderate assistance;BSC   Toileting- Clothing Manipulation and Hygiene: Maximal assistance;Sit to/from stand                         Pertinent Vitals/Pain Pain Score: 3  Pain Location: L knee Pain Descriptors / Indicators: Sore Pain Intervention(s): Monitored during session     Hand  Dominance     Extremity/Trunk Assessment Upper Extremity Assessment Upper Extremity Assessment: Overall WFL for tasks assessed           Communication Communication Communication: No difficulties   Cognition Arousal/Alertness: Awake/alert Behavior During Therapy: WFL for tasks assessed/performed Overall Cognitive Status: Impaired/Different from baseline Area of Impairment: Safety/judgement;Problem solving         Safety/Judgement: Decreased awareness of safety                    Home Living Family/patient expects to be discharged to:: Private residence Living Arrangements: Alone Available Help at Discharge: Family;Available 24 hours/day Type of Home: Apartment Home Access: Stairs to enter Entrance Stairs-Number of Steps: 15 Entrance Stairs-Rails: Right Home Layout: One level     Bathroom Shower/Tub: Producer, television/film/video: Standard Bathroom Accessibility: Yes   Home Equipment: Cane - single point;Crutches          Prior Functioning/Environment Level of Independence: Independent with assistive device(s);Independent             OT Diagnosis: Generalized weakness   OT Problem List: Decreased strength;Decreased activity tolerance;Decreased safety awareness   OT Treatment/Interventions: Self-care/ADL training;DME and/or AE instruction;Patient/family education    OT Goals(Current goals can be found in the care plan section) Acute Rehab OT Goals Patient Stated Goal: walk without pain OT Goal Formulation: With patient Time For Goal Achievement: 05/17/15  OT Frequency: Min 2X/week   Barriers to D/C: Decreased caregiver support  End of Session Nurse Communication: Mobility status  Activity Tolerance: Patient tolerated treatment well Patient left: in chair   Time: 3790-2409 OT Time Calculation (min): 22 min Charges:  OT General Charges $OT Visit: 1 Procedure OT Evaluation $Initial OT Evaluation Tier I: 1 Procedure OT  Treatments $Self Care/Home Management : 8-22 mins G-Codes:    Einar Crow D 05/31/2015, 11:16 AM

## 2015-05-03 NOTE — Progress Notes (Signed)
Physical Therapy Treatment Patient Details Name: Jacqueline Kim MRN: [id number] DOB: 1946-04-14 Today's Date: 05/03/2015    History of Present Illness L TKR     PT Comments    POD # 2  Pt declined am session x 2 due to distress after breakfast then requesting to rest till after lunch. PM session assisted pt OOB to amb to bathroom with 75% VC's on safe gait.  Very unsteady.  HIGH anxiety.  Impaired safety cognition and nervous.  Pt let go of walker twice during gait causing near fall.  Assisted in bathroom required min assist to prevent LOB.  Amb a limited distance in hallway requiring 75% VC's on proper walker to self distance/upright posture and esp safety with turns.  Pt appears anxious/nervous.  Assisted back to bed and performed some TKR TE's. Bed alarm activated and notified NT pt voided.    Follow Up Recommendations  Home health PT;Supervision/Assistance - 24 hour     Equipment Recommendations  Rolling walker with 5" wheels    Recommendations for Other Services       Precautions / Restrictions Precautions Precautions: Knee;Fall Precaution Comments: impaired safety cognition/impulsive/anxious Restrictions Weight Bearing Restrictions: No Other Position/Activity Restrictions: WBAT    Mobility  Bed Mobility Overal bed mobility: Needs Assistance Bed Mobility: Supine to Sit;Sit to Supine     Supine to sit: Min assist Sit to supine: Min assist   General bed mobility comments: 75% VC's on proper tech and increased time.  Nervous/anxious  Transfers Overall transfer level: Needs assistance Equipment used: Rolling walker (2 wheeled) Transfers: Sit to/from Stand Sit to Stand: Min assist;Mod assist         General transfer comment: 75% VC's on proper hand placement, proper L LE advancement and esp safety with turns for proper target.  Pt let go of walker x 3 in bathroom and required 100% direction.   Ambulation/Gait Ambulation/Gait assistance: Min assist Ambulation  Distance (Feet): 42 Feet Assistive device: Rolling walker (2 wheeled) Gait Pattern/deviations: Step-to pattern;Decreased stance time - left Gait velocity: decreased   General Gait Details: 75% VC's on proper sequencing, proper walker to self distance and esp with turn completion and safe target to toilet/bed.  Delayed cognitive responce and repeat instruction was required.   HIGH FALL RISK.   Stairs            Wheelchair Mobility    Modified Rankin (Stroke Patients Only)       Balance                                    Cognition                            Exercises   Total Knee Replacement TE's 10 reps B LE ankle pumps 10 reps towel squeezes 10 reps knee presses  5 reps heel slides AAROM  5 reps SAQ's AAROM 10 reps SLR's AAROM 10 reps ABD AAROM Followed by ICE     General Comments        Pertinent Vitals/Pain      Home Living                      Prior Function            PT Goals (current goals can now be found in the care plan section) Progress towards PT  goals: Progressing toward goals    Frequency  7X/week    PT Plan Current plan remains appropriate    Co-evaluation             End of Session Equipment Utilized During Treatment: Gait belt Activity Tolerance: Other (comment) (cognition) Patient left: in bed;with call bell/phone within reach;with bed alarm set     Time: 1440-1505 PT Time Calculation (min) (ACUTE ONLY): 25 min  Charges:  $Gait Training: 8-22 mins $Therapeutic Exercise: 8-22 mins                    G Codes:      Felecia Shelling  PTA [facility]  Acute  Rehab Pager      [phone]

## 2015-05-04 LAB — BASIC METABOLIC PANEL
Anion gap: 4 — ABNORMAL LOW (ref 5–15)
BUN: 19 mg/dL (ref 6–20)
CHLORIDE: 101 mmol/L (ref 101–111)
CO2: 27 mmol/L (ref 22–32)
Calcium: 8.7 mg/dL — ABNORMAL LOW (ref 8.9–10.3)
Creatinine, Ser: 0.97 mg/dL (ref 0.44–1.00)
GFR calc Af Amer: >60 mL/min (ref >60)
GFR calc non Af Amer: 57 mL/min — ABNORMAL LOW (ref >60)
Glucose, Bld: 97 mg/dL (ref 65–99)
POTASSIUM: 4.9 mmol/L (ref 3.5–5.1)
Sodium: 132 mmol/L — ABNORMAL LOW (ref 135–145)

## 2015-05-04 MED ORDER — TRAMADOL HCL 50 MG PO TABS: 50.0000 mg | ORAL_TABLET | Freq: Four times a day (QID) | ORAL | Status: DC | PRN

## 2015-05-04 MED ORDER — FERROUS SULFATE 325 (65 FE) MG PO TABS: 325.0000 mg | ORAL_TABLET | Freq: Three times a day (TID) | ORAL | Status: DC

## 2015-05-04 MED ORDER — ACETAMINOPHEN 325 MG PO TABS: 325.0000 mg | ORAL_TABLET | Freq: Four times a day (QID) | ORAL | Status: DC | PRN

## 2015-05-04 MED ORDER — POLYETHYLENE GLYCOL 3350 17 G PO PACK: 17.0000 g | PACK | Freq: Two times a day (BID) | ORAL | Status: DC

## 2015-05-04 MED ORDER — DOCUSATE SODIUM 100 MG PO CAPS: 100.0000 mg | ORAL_CAPSULE | Freq: Two times a day (BID) | ORAL | Status: DC

## 2015-05-04 NOTE — Progress Notes (Signed)
     Subjective: 3 Days Post-Op Procedure(s) (LRB): TOTAL LEFT KNEE ARTHROPLASTY (Left)   Patient reports pain as mild, pain controlled well.  Better after the change of medication.  Feels more her self and her head feels more clear.  No other events throughout the night.  Ready to be discharged home.  Objective:   VITALS:   Filed Vitals:   05/04/15 0505  BP: 118/53  Pulse: 60  Temp: 97.5 F (36.4 C)  Resp: 16    Dorsiflexion/Plantar flexion intact Incision: dressing C/D/I No cellulitis present Compartment soft  LABS  Recent Labs  05/02/15 0450 05/03/15 0408  HGB 9.9* 9.0*  HCT 30.8* 26.3*  WBC 11.0* 7.1  PLT 177 221     Recent Labs  05/02/15 0450 05/03/15 0408 05/04/15 0420  NA 130* 128* 132*  K 4.9 4.3 4.9  BUN 22* 23* 19  CREATININE 1.16* 1.22* 0.97  GLUCOSE 157* 114* 97     Assessment/Plan: 3 Days Post-Op Procedure(s) (LRB): TOTAL LEFT KNEE ARTHROPLASTY (Left) Up with therapy Discharge home with home health  Follow up in 2 weeks at [facility]. Follow up with OLIN,Wilma Michaelson D in 2 weeks.  Contact information:  [facility] [address], [address] [place] [address] [phone]    Post-op Dehydration Resolved with increased fluid intake and return to normal after operative fluid shift    Jacqueline Kim. Jacqueline Kim   PAC  05/04/2015, 8:35 AM

## 2015-05-04 NOTE — Progress Notes (Signed)
Patient tolerating regular diet, ambulating with walker, pain controlled with oral medications.  Discharge instructions reviewed with patient.  Patient verbalizes understanding.  Patient discharged to home.

## 2015-05-04 NOTE — Progress Notes (Signed)
Occupational Therapy Treatment Patient Details Name: Jacqueline Kim MRN: [id number] DOB: [birth year] Today's Date: 05/04/2015    History of present illness L TKR   OT comments  Pt remains anxious but able to work through ADL.  Cues for safety.  Recommend continued OT in [facility] setting and 24/7  Follow Up Recommendations  Home health OT;Supervision/Assistance - 24 hour    Equipment Recommendations  None recommended by OT    Recommendations for Other Services      Precautions / Restrictions Precautions Precautions: Knee;Fall Precaution Comments: pt very anxious Restrictions Other Position/Activity Restrictions: WBAT       Mobility Bed Mobility         Supine to sit: Min assist Sit to supine: Min assist   General bed mobility comments: assist to LLE back to bed and minimally OOB; assist for trunk for getting to EOB; cues for sequence  Transfers   Equipment used: Rolling walker (2 wheeled) Transfers: Sit to/from Stand Sit to Stand: Min assist;From elevated surface         General transfer comment: cues for UE/LE placement    Balance                                   ADL       Grooming: Oral care;Min guard;Standing       Lower Body Bathing: Minimal assistance;Sit to/from stand       Lower Body Dressing: Minimal assistance;Sit to/from stand                 General ADL Comments: completed ADL from EOB and ambulated to sink to brush teeth.  Reviewed shower transfer but did not practice this session.  Recommend she do this at home with [facility]      Vision                     Perception     Praxis      Cognition   Behavior During Therapy: [facility] for tasks assessed/performed Overall Cognitive Status: Impaired/Different from baseline Area of Impairment: Safety/judgement;Problem solving          Safety/Judgement: Decreased awareness of safety          Extremity/Trunk Assessment               Exercises      Shoulder Instructions       General Comments      Pertinent Vitals/ Pain       Pain Assessment: 0-10 Pain Score: 5  (to 8 when weightbearing) Pain Location: L knee Pain Descriptors / Indicators: Aching;Sore Pain Intervention(s): Limited activity within patient's tolerance;Monitored during session;Premedicated before session;Repositioned;Ice applied  Home Living                                          Prior Functioning/Environment              Frequency Min 2X/week     Progress Toward Goals  OT Goals(current goals can now be found in the care plan section)  Progress towards OT goals: Progressing toward goals     Plan Discharge plan remains appropriate    Co-evaluation                 End of Session     Activity Tolerance  Patient tolerated treatment well   Patient Left in bed;with call bell/phone within reach;with bed alarm set;with nursing/sitter in room   Nurse Communication          Time: 7262-0355 OT Time Calculation (min): 27 min  Charges: OT General Charges $OT Visit: 1 Procedure OT Treatments $Self Care/Home Management : 23-37 mins  Jacqueline Kim 05/04/2015, 12:26 PM Jacqueline Kim, OTR/L [phone] 05/04/2015

## 2015-05-04 NOTE — Progress Notes (Signed)
Physical Therapy Treatment Patient Details Name: Jacqueline Kim MRN: [id number] DOB: 02-15-1946 Today's Date: 05/04/2015    History of Present Illness L TKR    PT Comments    Pt. Requested min assist for navigation of LLE for supine > sit, no HOB elevated. Required min guard/min assist for STS and >75% VC as patient places hands on RW prior to standing; ambulated ~100 ft with RW and min guard for safety and 75% VC to stay within walker, and to not leave walker until at final destination, ready to sit; performed stair training in stairwell; navigated 12 steps up and down with rail on L and crutch in R; used correct lead leg until last step. Returned to room where patient ambulated to bathroom and asked for a few minutes; after 2 minutes, checked and patient self assisted herself up and was walking to sink to wash hands; informed this is not safe. Upon patient returning to bed, she requested NO assist for LLE navigation as she "needed to do it herself". Pt. VERY IMPULSIVE and anxious. HIGH FALL RISK.   Follow Up Recommendations  Home health PT;Supervision/Assistance - 24 hour     Equipment Recommendations  Rolling walker with 5" wheels    Recommendations for Other Services OT consult     Precautions / Restrictions Precautions Precautions: Knee;Fall Precaution Comments: pt anxious and impulsive  Restrictions Weight Bearing Restrictions: No Other Position/Activity Restrictions: WBAT    Mobility  Bed Mobility Overal bed mobility: Needs Assistance;Modified Independent       Supine to sit: Min assist Sit to supine: Modified independent (Device/Increase time)   General bed mobility comments: pt. requested assist for navigation of LLE for supine > sit but requested to let her do it independently upon sit > supine; increased time  Transfers Overall transfer level: Needs assistance Equipment used: Rolling walker (2 wheeled) Transfers: Sit to/from Stand Sit to Stand: Min guard;Min  assist         General transfer comment: >75% VC for hand placement as pt. tends to always place hands on RW for STS  Ambulation/Gait Ambulation/Gait assistance: Min guard;Supervision Ambulation Distance (Feet): 100 Feet Assistive device: Rolling walker (2 wheeled) Gait Pattern/deviations: Step-to pattern;Decreased step length - right;Decreased stance time - left;Decreased dorsiflexion - left;Decreased weight shift to left;Antalgic Gait velocity: decreased    General Gait Details: 50% VC on proper sequencing and to ambulate within the walker; 50% VC needed to remain inside walker until safely at destination as pt. would stop RW 5 ft from surface and turn away from RW; very impulsive; HIGH FALL RISK!    Stairs Stairs: Yes Stairs assistance: Min assist Stair Management: One rail Right;Step to pattern;Forwards;With crutches Number of Stairs: 12 General stair comments: pt. performed stair training requiring min assist for VC and safety; >75% VC needed at top and bottom of steps to get close to rail and step before leaving RW, to not move as caregiver places RW at destination (pt. started stepping as soon as RW began to be brought down steps); all but 1 step pt. was able to adhere to correct lead leg, upon last step down went to lead with R  Wheelchair Mobility    Modified Rankin (Stroke Patients Only)       Balance                                    Cognition Arousal/Alertness: Awake/alert Behavior During  Therapy: WFL for tasks assessed/performed;Anxious;Impulsive Overall Cognitive Status: Within Functional Limits for tasks assessed Area of Impairment: Safety/judgement         Safety/Judgement: Decreased awareness of safety     General Comments: pt. assisted self off toilet to sink, places hands on RW for STS    Exercises      General Comments        Pertinent Vitals/Pain Pain Assessment: 0-10 Pain Score: 6  (between a 5-6) Pain Location: L  knee Pain Descriptors / Indicators: Sore Pain Intervention(s): Limited activity within patient's tolerance;Monitored during session;Premedicated before session (pt denied ice application post treat)    Home Living                      Prior Function            PT Goals (current goals can now be found in the care plan section) Acute Rehab PT Goals Time For Goal Achievement: 05/05/15 Potential to Achieve Goals: Good Progress towards PT goals: Progressing toward goals    Frequency  7X/week    PT Plan Current plan remains appropriate    Co-evaluation             End of Session Equipment Utilized During Treatment: Gait belt Activity Tolerance: Patient tolerated treatment well Patient left: in bed;with call bell/phone within reach;with bed alarm set     Time: 1200-1230 PT Time Calculation (min) (ACUTE ONLY): 30 min  Charges:  $Gait Training: 8-22 mins $Therapeutic Activity: 8-22 mins                    G CodesMarin Comment, SPTA   05/04/2015 1:35 PM   Pager: [phone]   Reviewed and agree with above Felecia Shelling  PTA [facility]  Acute  Rehab Pager      [phone]

## 2015-05-04 NOTE — Care Management Important Message (Signed)
Important Message  Patient Details  Name: STARNISHA BATREZ MRN: [id number] Date of Birth: September 11, 1945   Medicare Important Message Given:  Yes    Renie Ora 05/04/2015, 1:01 PMImportant Message  Patient Details  Name: ELIYANA PAGLIARO MRN: [id number] Date of Birth: 12-01-1945   Medicare Important Message Given:  Yes    Renie Ora 05/04/2015, 1:01 PM

## 2015-05-05 DIAGNOSIS — E860 Dehydration: Secondary | ICD-10-CM | POA: Diagnosis not present

## 2015-05-08 NOTE — Progress Notes (Addendum)
     Subjective: 2 Days Post-Op Procedure(s) (LRB): TOTAL LEFT KNEE ARTHROPLASTY (Left)   Patient reports pain as mild, pain controlled. No events throughout the night.   Objective:   VITALS:    05/03/15  BP: 105/55  Pulse: 61  Temp: 98.6 F (37 C)  Resp: 16    Dorsiflexion/Plantar flexion intact Incision: dressing C/D/I No cellulitis present Compartment soft   LABS  Recent Labs (last 2 labs)      Recent Labs  05/02/15 0450 05/03/15 0408  HGB 9.9* 9.0*  HCT 30.8* 26.3*  WBC 11.0* 7.1  PLT 177 221       Recent Labs (last 2 labs)      Recent Labs  05/02/15 0450 05/03/15 0408  NA 130* 128*  K 4.9 4.3  BUN 22* 23*  CREATININE 1.16* 1.22*  GLUCOSE 157* 114*              Assessment/Plan: 2 Days Post-Op Procedure(s) (LRB): TOTAL LEFT KNEE ARTHROPLASTY (Left)  Up with therapy Discharge home with home health eventually, when ready     Anastasio Auerbach. Codi Folkerts   PAC  05/08/2015, 3:00 PM

## 2015-05-08 NOTE — Discharge Summary (Signed)
Physician Discharge Summary  Patient ID: Jacqueline Kim MRN: [id number] DOB/AGE: 1946-04-03 69 y.o.  Admit date: 05/01/2015 Discharge date: 05/04/2015   Procedures:  Procedure(s) (LRB): TOTAL LEFT KNEE ARTHROPLASTY (Left)  Attending Physician:  Dr. Durene Romans   Admission Diagnoses:   Left knee primary OA / pain  Discharge Diagnoses:  Principal Problem:   S/P left TKA Active Problems:   S/P knee replacement   Dehydration  Past Medical History  Diagnosis Date  . Paroxysmal a-fib (HCC) diagnosed 2008    a. On propafenone, Pradaxa.  . 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 Hypothyroid   . Anxiety   . Hypertension   . Glaucoma   . Complication of anesthesia     DIFFICULTY WAKING UP  . Arthritis   . Anticoagulant long-term use  WITH PRADAXA 05/27/2011  . DJD (degenerative joint disease)   . GERD (gastroesophageal reflux disease)   . IBS (irritable bowel syndrome)   . Depression   . Diverticulosis   . External hemorrhoids   . Esophagitis 09/17/06  . History of stress test     a. 2008 - normal nuc.  . Presence of permanent cardiac pacemaker   . Cellulitis of buttock, left 12/20/2014    HPI:    Jacqueline Kim, 69 y.o. female, has a history of pain and functional disability in the left knee due to arthritis and has failed non-surgical conservative treatments for greater than 12 weeks to include NSAID's and/or analgesics, corticosteriod injections, viscosupplementation injections and activity modification. Onset of symptoms was abrupt, starting June 25, 2014 with gradually worsening course since that time. The patient noted prior procedures on the knee to include arthroscopy and menisectomy on the left knee(s). Patient currently rates pain in the left knee(s) at 10 out of 10 with activity. Patient has night pain, worsening of pain with activity and weight bearing, pain that interferes with activities of daily living, pain  with passive range of motion, crepitus and joint swelling. Patient has evidence of periarticular osteophytes and joint space narrowing by imaging studies. There is no active infection. Risks, benefits and expectations were discussed with the patient. Risks including but not limited to the risk of anesthesia, blood clots, nerve damage, blood vessel damage, failure of the prosthesis, infection and up to and including death. Patient understand the risks, benefits and expectations and wishes to proceed with surgery.   PCP: Thayer Headings, MD   Discharged Condition: good  Hospital Course:  Patient underwent the above stated procedure on 05/01/2015. Patient tolerated the procedure well and brought to the recovery room in good condition and subsequently to the floor.  POD #1 BP: 127/62 ; Pulse: 68 ; Temp: 98 F (36.7 C) ; Resp: 16 Patient reports pain as mild, pain controlled. No events throughout the night. Weary about working with PT. Dorsiflexion/plantar flexion intact, incision: dressing C/D/I, no cellulitis present and compartment soft.   LABS  Basename    HGB     9.9  HCT     30.8   POD #2  BP: 105/55 ; Pulse: 61 ; Temp: 98.6 F (37 C) ; Resp: 16 Patient reports pain as mild, pain controlled. No events throughout the night.  Dorsiflexion/plantar flexion intact, incision: dressing C/D/I, no cellulitis present and compartment soft.   LABS  Basename    HGB     9.0  HCT     26.3   POD #3  BP: 118/53 ; Pulse: 60 ;  Temp: 97.5 F (36.4 C) ; Resp: 16 Patient reports pain as mild, pain controlled well. Better after the change of medication. Feels more her self and her head feels more clear. No other events throughout the night. Ready to be discharged home. Dorsiflexion/plantar flexion intact, incision: dressing C/D/I, no cellulitis present and compartment soft.   LABS   No new labs   Discharge Exam: General appearance: alert, cooperative and no distress Extremities: Homans sign  is negative, no sign of DVT, no edema, redness or tenderness in the calves or thighs and no ulcers, gangrene or trophic changes  Disposition: Home with follow up in 2 weeks   Follow-up Information    Follow up with Shelda Pal, MD. Schedule an appointment as soon as possible for a visit in 2 weeks.   Specialty:  Orthopedic Surgery   Contact information:   [address] [place] [address] [phone]       Follow up with [facility].   Why:  home health physical therapy   Contact information:   [address] [place] [address] [phone]       Follow up with [facility].   Why:  rolling walker and 3n1 (over the commode seat)   Contact information:   [address] [place] [address] [phone]       Discharge Instructions    Call MD / Call 911    Complete by:  As directed   If you experience chest pain or shortness of breath, CALL 911 and be transported to the hospital emergency room.  If you develope a fever above 101 F, pus (white drainage) or increased drainage or redness at the wound, or calf pain, call your surgeon's office.     Change dressing    Complete by:  As directed   Maintain surgical dressing until follow up in the clinic. If the edges start to pull up, may reinforce with tape. If the dressing is no longer working, may remove and cover with gauze and tape, but must keep the area dry and clean.  Call with any questions or concerns.     Constipation Prevention    Complete by:  As directed   Drink plenty of fluids.  Prune juice may be helpful.  You may use a stool softener, such as Colace (over the counter) 100 mg twice a day.  Use MiraLax (over the counter) for constipation as needed.     Diet - low sodium heart healthy    Complete by:  As directed      Discharge instructions    Complete by:  As directed   Maintain surgical dressing until follow up in the clinic. If the edges start to  pull up, may reinforce with tape. If the dressing is no longer working, may remove and cover with gauze and tape, but must keep the area dry and clean.  Follow up in 2 weeks at [facility]. Call with any questions or concerns.     Increase activity slowly as tolerated    Complete by:  As directed   Weight bearing as tolerated with assist device (walker, cane, etc) as directed, use it as long as suggested by your surgeon or therapist, typically at least 4-6 weeks.     TED hose    Complete by:  As directed   Use stockings (TED hose) for 2 weeks on both leg(s).  You may remove them at night for  sleeping.             Medication List    STOP taking these medications        diclofenac sodium 1 % Gel  Commonly known as:  VOLTAREN     Garcinia Cambogia-Chromium 500-200 MG-MCG Tabs     HYDROcodone-acetaminophen 7.5-325 MG tablet  Commonly known as:  NORCO      TAKE these medications        acetaminophen 325 MG tablet  Commonly known as:  TYLENOL  Take 1-2 tablets (325-650 mg total) by mouth every 6 (six) hours as needed for moderate pain.     apixaban 5 MG Tabs tablet  Commonly known as:  ELIQUIS  Take 1 tablet (5 mg total) by mouth 2 (two) times daily.     atenolol 50 MG tablet  Commonly known as:  TENORMIN  Take 1 tablet (50 mg total) by mouth every morning.     Biotin 5000 MCG Tabs  Take 1 tablet by mouth daily.     celecoxib 200 MG capsule  Commonly known as:  CELEBREX  Take 200 mg by mouth daily as needed (pain).     dicyclomine 10 MG capsule  Commonly known as:  BENTYL  TAKE ONE CAPSULE 3 TIMES DAILY BEFORE MEALS     diltiazem 120 MG 24 hr capsule  Commonly known as:  CARDIZEM CD  Take 1 capsule (120 mg total) by mouth at bedtime.     docusate sodium 100 MG capsule  Commonly known as:  COLACE  Take 1 capsule (100 mg total) by mouth 2 (two) times daily.     escitalopram 20 MG tablet  Commonly known as:  LEXAPRO  Take 20 mg by mouth daily.      ferrous sulfate 325 (65 FE) MG tablet  Take 1 tablet (325 mg total) by mouth 3 (three) times daily after meals.     furosemide 20 MG tablet  Commonly known as:  LASIX  Take 1 tablet (20 mg total) by mouth daily as needed for fluid.     levothyroxine 112 MCG tablet  Commonly known as:  SYNTHROID, LEVOTHROID  Take 112 mcg by mouth daily before breakfast.     losartan 50 MG tablet  Commonly known as:  COZAAR  Take 1 tablet (50 mg total) by mouth daily.     polyethylene glycol packet  Commonly known as:  MIRALAX / GLYCOLAX  Take 17 g by mouth 2 (two) times daily.     propafenone 150 MG tablet  Commonly known as:  RYTHMOL  Take 1.5 tablet in the morning, 1 tablet midday, 1.5 tablet in the evening     traMADol 50 MG tablet  Commonly known as:  ULTRAM  Take 1-2 tablets (50-100 mg total) by mouth every 6 (six) hours as needed for moderate pain.     traZODone 150 MG tablet  Commonly known as:  DESYREL  Take 150 mg by mouth at bedtime.     zaleplon 10 MG capsule  Commonly known as:  SONATA  Take 10 mg by mouth at bedtime.         Signed: Anastasio Auerbach. Trindon Dorton   PA-C  05/08/2015, 2:57 PM

## 2015-05-21 ENCOUNTER — Other Ambulatory Visit: Payer: Self-pay | Admitting: Internal Medicine

## 2015-07-09 HISTORY — PX: BUNIONECTOMY: SHX129

## 2015-07-12 DIAGNOSIS — Z1231 Encounter for screening mammogram for malignant neoplasm of breast: Secondary | ICD-10-CM | POA: Diagnosis not present

## 2015-07-30 DIAGNOSIS — Z01419 Encounter for gynecological examination (general) (routine) without abnormal findings: Secondary | ICD-10-CM | POA: Diagnosis not present

## 2015-07-30 DIAGNOSIS — Z780 Asymptomatic menopausal state: Secondary | ICD-10-CM | POA: Diagnosis not present

## 2015-07-30 DIAGNOSIS — N952 Postmenopausal atrophic vaginitis: Secondary | ICD-10-CM | POA: Diagnosis not present

## 2015-08-06 ENCOUNTER — Other Ambulatory Visit: Payer: Self-pay

## 2015-08-06 MED ORDER — LOSARTAN POTASSIUM 50 MG PO TABS: 50.0000 mg | ORAL_TABLET | Freq: Every day | ORAL | Status: DC

## 2015-08-06 NOTE — Telephone Encounter (Signed)
Rx(s) sent to pharmacy electronically.  

## 2015-10-15 DIAGNOSIS — Z471 Aftercare following joint replacement surgery: Secondary | ICD-10-CM | POA: Diagnosis not present

## 2015-10-15 DIAGNOSIS — Z96652 Presence of left artificial knee joint: Secondary | ICD-10-CM | POA: Diagnosis not present

## 2015-10-15 DIAGNOSIS — M25562 Pain in left knee: Secondary | ICD-10-CM | POA: Diagnosis not present

## 2015-10-16 DIAGNOSIS — M2550 Pain in unspecified joint: Secondary | ICD-10-CM | POA: Diagnosis not present

## 2015-10-16 DIAGNOSIS — M150 Primary generalized (osteo)arthritis: Secondary | ICD-10-CM | POA: Diagnosis not present

## 2015-10-25 DIAGNOSIS — G4700 Insomnia, unspecified: Secondary | ICD-10-CM | POA: Diagnosis not present

## 2015-10-25 DIAGNOSIS — K589 Irritable bowel syndrome without diarrhea: Secondary | ICD-10-CM | POA: Diagnosis not present

## 2015-10-25 DIAGNOSIS — I159 Secondary hypertension, unspecified: Secondary | ICD-10-CM | POA: Diagnosis not present

## 2015-10-25 DIAGNOSIS — Z23 Encounter for immunization: Secondary | ICD-10-CM | POA: Diagnosis not present

## 2015-12-10 DIAGNOSIS — M150 Primary generalized (osteo)arthritis: Secondary | ICD-10-CM | POA: Diagnosis not present

## 2015-12-10 DIAGNOSIS — M2550 Pain in unspecified joint: Secondary | ICD-10-CM | POA: Diagnosis not present

## 2015-12-10 DIAGNOSIS — R768 Other specified abnormal immunological findings in serum: Secondary | ICD-10-CM | POA: Diagnosis not present

## 2015-12-17 ENCOUNTER — Other Ambulatory Visit: Payer: Self-pay | Admitting: Cardiovascular Disease

## 2016-01-14 ENCOUNTER — Other Ambulatory Visit: Payer: Self-pay | Admitting: Cardiovascular Disease

## 2016-01-22 ENCOUNTER — Encounter (HOSPITAL_COMMUNITY): Payer: Self-pay | Admitting: Vascular Surgery

## 2016-01-22 ENCOUNTER — Emergency Department (HOSPITAL_COMMUNITY)
Admission: EM | Admit: 2016-01-22 | Discharge: 2016-01-22 | Disposition: A | Discharge status: Home / Self Care | Payer: Medicare Other | Attending: Emergency Medicine | Admitting: Emergency Medicine

## 2016-01-22 ENCOUNTER — Emergency Department (HOSPITAL_COMMUNITY): Payer: Medicare Other

## 2016-01-22 DIAGNOSIS — K5900 Constipation, unspecified: Secondary | ICD-10-CM | POA: Diagnosis not present

## 2016-01-22 DIAGNOSIS — E039 Hypothyroidism, unspecified: Secondary | ICD-10-CM | POA: Diagnosis not present

## 2016-01-22 DIAGNOSIS — Z7901 Long term (current) use of anticoagulants: Secondary | ICD-10-CM | POA: Insufficient documentation

## 2016-01-22 DIAGNOSIS — I10 Essential (primary) hypertension: Secondary | ICD-10-CM | POA: Insufficient documentation

## 2016-01-22 DIAGNOSIS — Z96652 Presence of left artificial knee joint: Secondary | ICD-10-CM | POA: Insufficient documentation

## 2016-01-22 DIAGNOSIS — Z87891 Personal history of nicotine dependence: Secondary | ICD-10-CM | POA: Insufficient documentation

## 2016-01-22 DIAGNOSIS — K625 Hemorrhage of anus and rectum: Secondary | ICD-10-CM | POA: Diagnosis not present

## 2016-01-22 DIAGNOSIS — Z950 Presence of cardiac pacemaker: Secondary | ICD-10-CM | POA: Insufficient documentation

## 2016-01-22 LAB — COMPREHENSIVE METABOLIC PANEL
ALBUMIN: 4 g/dL (ref 3.5–5.0)
ALT: 14 U/L (ref 14–54)
ANION GAP: 9 (ref 5–15)
AST: 26 U/L (ref 15–41)
Alkaline Phosphatase: 40 U/L (ref 38–126)
BUN: 16 mg/dL (ref 6–20)
CHLORIDE: 100 mmol/L — AB (ref 101–111)
CO2: 25 mmol/L (ref 22–32)
Calcium: 9.7 mg/dL (ref 8.9–10.3)
Creatinine, Ser: 1.13 mg/dL — ABNORMAL HIGH (ref 0.44–1.00)
GFR calc Af Amer: 55 mL/min — ABNORMAL LOW (ref >60)
GFR calc non Af Amer: 47 mL/min — ABNORMAL LOW (ref >60)
GLUCOSE: 92 mg/dL (ref 65–99)
POTASSIUM: 4.1 mmol/L (ref 3.5–5.1)
SODIUM: 134 mmol/L — AB (ref 135–145)
Total Bilirubin: 0.6 mg/dL (ref 0.3–1.2)
Total Protein: 6.9 g/dL (ref 6.5–8.1)

## 2016-01-22 LAB — URINE MICROSCOPIC-ADD ON: RBC / HPF: NONE SEEN RBC/hpf (ref 0–5)

## 2016-01-22 LAB — CBC
HEMATOCRIT: 36.6 % (ref 36.0–46.0)
HEMOGLOBIN: 11.8 g/dL — AB (ref 12.0–15.0)
MCH: 31.1 pg (ref 26.0–34.0)
MCHC: 32.2 g/dL (ref 30.0–36.0)
MCV: 96.6 fL (ref 78.0–100.0)
Platelets: 239 10*3/uL (ref 150–400)
RBC: 3.79 MIL/uL — ABNORMAL LOW (ref 3.87–5.11)
RDW: 12.8 % (ref 11.5–15.5)
WBC: 5.2 10*3/uL (ref 4.0–10.5)

## 2016-01-22 LAB — URINALYSIS, ROUTINE W REFLEX MICROSCOPIC
BILIRUBIN URINE: NEGATIVE
Glucose, UA: NEGATIVE mg/dL
HGB URINE DIPSTICK: NEGATIVE
Ketones, ur: NEGATIVE mg/dL
NITRITE: NEGATIVE
PROTEIN: NEGATIVE mg/dL
SPECIFIC GRAVITY, URINE: 1.028 (ref 1.005–1.030)
pH: 6 (ref 5.0–8.0)

## 2016-01-22 LAB — TYPE AND SCREEN
ABO/RH(D): A POS
Antibody Screen: NEGATIVE

## 2016-01-22 LAB — ABO/RH: ABO/RH(D): A POS

## 2016-01-22 LAB — POC OCCULT BLOOD, ED: Fecal Occult Bld: POSITIVE — AB

## 2016-01-22 MED ORDER — IOPAMIDOL (ISOVUE-300) INJECTION 61%
INTRAVENOUS | Status: AC
  Administered 2016-01-22: 80 mL
  Filled 2016-01-22: qty 100

## 2016-01-22 NOTE — ED Notes (Signed)
Dr. Cook at the bedside.  

## 2016-01-22 NOTE — Discharge Instructions (Signed)
Tests showed no life-threatening condition. Recommend following up with [facility] gastroenterology.

## 2016-01-22 NOTE — ED Triage Notes (Signed)
Pt reports to the ED for eval of BRB per rectum since Thursday. Pt has hx of hemorrhoids and states this feels similar. She has hx of constipation as well which makes the hemorrhoids worse. Pt reports the blood is dripping even in between BMs. She is on blood thinners (Eloquis). Pt A&Ox4, resp e/u, and skin warm and dry.

## 2016-01-22 NOTE — ED Provider Notes (Signed)
[facility] DEPT Provider Note   CSN: [id number] Arrival date & time: 01/22/16  1528  First Provider Contact:  First MD Initiated Contact with Patient 01/22/16 1822        History   Chief Complaint Chief Complaint  Patient presents with  . Rectal Bleeding    HPI Jacqueline Kim is a 70 y.o. female.  Intermittent rectal bleeding for approximately 1 week. At times she has described a dripping of blood, but at other times, no bleeding at all. She has a history of IBS, arthritis, atrial fibrillation on Eliquis, pacemaker insertion. She has issues with constipation.  She has a normal appetite. No vomiting, diarrhea, fever, sweats, chills, dysuria      Past Medical History:  Diagnosis Date  . Anticoagulant long-term use  WITH PRADAXA 05/27/2011  . Anxiety   . Arthritis   . Cellulitis of buttock, left 12/20/2014  . Complication of anesthesia    DIFFICULTY WAKING UP  . Depression   . Diverticulosis   . DJD (degenerative joint disease)   . Esophagitis 09/17/06  . External hemorrhoids   . GERD (gastroesophageal reflux disease)   . Glaucoma   . History of stress test    a. 2008 - normal nuc.  Marland Kitchen Hypertension   . Hypothyroid   . IBS (irritable bowel syndrome)   . Paroxysmal a-fib (HCC) diagnosed 2008   a. On propafenone, Pradaxa.  . Presence of permanent cardiac pacemaker   . 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 Dehydration 05/05/2015  . S/P left TKA 05/01/2015  . S/P knee replacement 05/01/2015  . Hypotension due to drugs 02/20/2015  . Cellulitis of left buttock 12/20/2014  . Cellulitis 12/20/2014  . AKI (acute kidney injury) (HCC) 12/20/2014  . Hyponatremia 12/20/2014  . Paroxysmal atrial fibrillation (HCC) 08/01/2014  . Irritable bowel syndrome 06/14/2014  . Fatigue 03/06/2014  . HTN (hypertension) 09/28/2013  . Atrial fibrillation with rapid ventricular response;  paroxysmal 09/18/2013  . Chest pain 10/08/2012  . D-dimer, elevated, pt. on pradaxa 09/11/2011  . Chest pain, negative MI, resolved once heart rate slowed, secondary to rapid a. fib 09/11/2011  . Paroxysmal a-fib, recurrent 07/13/12- (on Propafenone)  05/27/2011  . Sinus node dysfunction (HCC) 05/27/2011  . Hypothyroid 05/27/2011  . 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 Anxiety 05/27/2011  . Anticoagulant long-term use  WITH PRADAXA, CHADS Vasc score 3 05/27/2011    Past Surgical History:  Procedure Laterality Date  . BREAST REDUCTION SURGERY    . CARDIOVERSION N/A 08/02/2014   Procedure: CARDIOVERSION;  Surgeon: Quintella Reichert, MD;  Location: [facility];  Service: Cardiovascular;  Laterality: N/A;  . EYE SURGERY Bilateral    cataracts  . FACIAL COSMETIC SURGERY    . FOOT SURGERY    . HAND SURGERY     multiple  . LIPOSUCTION    . NM MYOCAR PERF WALL MOTION  12/29/2006   No significant ischemia; EF 69%  . OVARIAN CYST REMOVAL    . PACEMAKER INSERTION  04/08/11   MDT Revo implanted by Dr Rubie Maid  . TONSILLECTOMY    . TOTAL KNEE ARTHROPLASTY Left 05/01/2015   Procedure: TOTAL LEFT KNEE ARTHROPLASTY;  Surgeon: Durene Romans, MD;  Location: [facility];  Service: Orthopedics;  Laterality: Left;  Marland Kitchen VAGINAL HYSTERECTOMY      OB History    No data available  Home Medications    Prior to Admission medications   Medication Sig Start Date End Date Taking? Authorizing Provider  acetaminophen (TYLENOL) 325 MG tablet Take 1-2 tablets (325-650 mg total) by mouth every 6 (six) hours as needed for moderate pain. 05/04/15   Lanney Gins, PA-C  apixaban (ELIQUIS) 5 MG TABS tablet Take 1 tablet (5 mg total) by mouth 2 (two) times daily. 03/19/15   [name], MD  atenolol (TENORMIN) 50 MG tablet TAKE 1 TABLET BY MOUTH EVERY MORNING. [facility] FOR TENORMIN) 12/19/15   [name], MD  Biotin 5000 MCG TABS Take 1 tablet by mouth daily.    Historical Provider, MD    celecoxib (CELEBREX) 200 MG capsule Take 200 mg by mouth daily as needed (pain).    Historical Provider, MD  dicyclomine (BENTYL) 10 MG capsule TAKE ONE CAPSULE 3 TIMES DAILY BEFORE MEALS 03/02/15   Beverley Fiedler, MD  DILT-CD 120 MG 24 hr capsule TAKE 1 CAPSULE BY MOUTH DAILY AT BEDTIME 12/19/15   [name], MD  docusate sodium (COLACE) 100 MG capsule Take 1 capsule (100 mg total) by mouth 2 (two) times daily. 05/04/15   Lanney Gins, PA-C  escitalopram (LEXAPRO) 20 MG tablet Take 20 mg by mouth daily.     Historical Provider, MD  ferrous sulfate 325 (65 FE) MG tablet Take 1 tablet (325 mg total) by mouth 3 (three) times daily after meals. 05/04/15   Lanney Gins, PA-C  furosemide (LASIX) 20 MG tablet Take 1 tablet (20 mg total) by mouth daily as needed for fluid. 12/25/14   Marinda Elk, MD  levothyroxine (SYNTHROID, LEVOTHROID) 112 MCG tablet Take 112 mcg by mouth daily before breakfast.     Historical Provider, MD  losartan (COZAAR) 50 MG tablet Take 1 tablet (50 mg total) by mouth daily. 08/06/15   [name], MD  polyethylene glycol [facility] E[facility]lene Hal) packet Take 17 g by mouth 2 (two) times daily. 05/04/15   Lanney Gins, PA-C  propafenone (RYTHMOL) 150 MG tablet TAKE 1 AND 1/2 TABLETS BY MOUTH IN THE MORNING, TAKE 1 TABLET MIDDAY AND TAKE 1 AND 1/2 TABLETS IN THE EVENING (GENERIC FOR RYTHMOL) 01/15/16   [name], MD  traMADol (ULTRAM) 50 MG tablet Take 1-2 tablets (50-100 mg total) by mouth every 6 (six) hours as needed for moderate pain. 05/04/15   Lanney Gins, PA-C  traZODone (DESYREL) 150 MG tablet Take 150 mg by mouth at bedtime.      Historical Provider, MD  zaleplon (SONATA) 10 MG capsule Take 10 mg by mouth at bedtime.     Historical Provider, MD    Family History Family History  Problem Relation Age of Onset  . Anesthesia problems Mother   . Colon polyps Mother     beign  . Colon cancer Neg Hx     Social History Social History  Substance Use Topics   . Smoking status: Former Smoker    Quit date: 08/30/1996  . Smokeless tobacco: Never Used  . Alcohol use 3.6 oz/week    6 Glasses of wine per week     Comment: daily     Allergies   Azithromycin; Epinephrine; Penicillins; Metoprolol; and Lidocaine   Review of Systems Review of Systems  All other systems reviewed and are negative.    Physical Exam Updated Vital Signs BP 147/72   Pulse 62   Temp 98 F (36.7 C) (Oral)   Resp 19   Ht 5'  6" (1.676 m)   Wt 150 lb (68 kg)   SpO2 100%   BMI 24.21 kg/m   Physical Exam  Constitutional: She appears well-developed and well-nourished. No distress.  HENT:  Head: Normocephalic and atraumatic.  Eyes: Conjunctivae are normal.  Neck: Neck supple.  Cardiovascular: Normal rate and regular rhythm.   No murmur heard. Pulmonary/Chest: Effort normal and breath sounds normal. No respiratory distress.  Abdominal: Soft. There is no tenderness.  Genitourinary:  Genitourinary Comments: Rectal exam: No masses. No gross bleeding. Hemoccult slightly positive.  Musculoskeletal: She exhibits no edema.  Neurological: She is alert.  Skin: Skin is warm and dry.  Psychiatric: She has a normal mood and affect.  Nursing note and vitals reviewed.    ED Treatments / Results  Labs (all labs ordered are listed, but only abnormal results are displayed) Labs Reviewed  COMPREHENSIVE METABOLIC PANEL - Abnormal; Notable for the following:       Result Value   Sodium 134 (*)    Chloride 100 (*)    Creatinine, Ser 1.13 (*)    GFR calc non Af Amer 47 (*)    GFR calc Af Amer 55 (*)    All other components within normal limits  CBC - Abnormal; Notable for the following:    RBC 3.79 (*)    Hemoglobin 11.8 (*)    All other components within normal limits  URINALYSIS, ROUTINE W REFLEX MICROSCOPIC (NOT AT [facility]) - Abnormal; Notable for the following:    APPearance CLOUDY (*)    Leukocytes, UA TRACE (*)    All other components within normal limits   URINE MICROSCOPIC-ADD ON - Abnormal; Notable for the following:    Squamous Epithelial / LPF 6-30 (*)    Bacteria, UA MANY (*)    All other components within normal limits  POC OCCULT BLOOD, ED - Abnormal; Notable for the following:    Fecal Occult Bld POSITIVE (*)    All other components within normal limits  TYPE AND SCREEN  ABO/RH    EKG  EKG Interpretation None       Radiology Ct Abdomen Pelvis W Contrast  Result Date: 01/22/2016 CLINICAL DATA:  Rectal bleeding and constipation for 5 days. EXAM: CT ABDOMEN AND PELVIS WITH CONTRAST TECHNIQUE: Multidetector CT imaging of the abdomen and pelvis was performed using the standard protocol following bolus administration of intravenous contrast. CONTRAST:  25mL ISOVUE-300 IOPAMIDOL (ISOVUE-300) INJECTION 61% COMPARISON:  11/07/2010 FINDINGS: Lower chest:  Compressive atelectasis. Hepatobiliary: No focal abnormality within the liver parenchyma. There is no evidence for gallstones, gallbladder wall thickening, or pericholecystic fluid. No intrahepatic or extrahepatic biliary dilation. Pancreas: Pancreatic ductal anatomy is compatible with pancreas divisum. No focal pancreatic mass lesion. No dilatation of the main duct. Spleen: No splenomegaly. No focal mass lesion. Adrenals/Urinary Tract: No adrenal nodule or mass. Kidneys are unremarkable. No evidence for hydroureter. The urinary bladder appears normal for the degree of distention. Stomach/Bowel: Stomach is nondistended. No gastric wall thickening. No evidence of outlet obstruction. Duodenum is normally positioned as is the ligament of Treitz. No small bowel wall thickening. No small bowel dilatation. The terminal ileum is normal. The appendix is not visualized, but there is no edema or inflammation in the region of the cecum. Diverticuli are seen scattered along the entire length of the colon without CT findings of diverticulitis. Vascular/Lymphatic: There is abdominal aortic atherosclerosis  without aneurysm. There is no gastrohepatic or hepatoduodenal ligament lymphadenopathy. No intraperitoneal or retroperitoneal lymphadenopathy. No pelvic sidewall lymphadenopathy.  Reproductive: Status post hysterectomy There is no adnexal mass. Other: No intraperitoneal free fluid. Musculoskeletal: Bone windows reveal no worrisome lytic or sclerotic osseous lesions. IMPRESSION: 1. No acute findings in the abdomen or pelvis. Specifically, no features to explain the patient's history of rectal bleeding and constipation. 2. Diffuse mild colonic diverticulosis without diverticulitis. 3. Abdominal aortic atherosclerosis. Electronically Signed   By: Kennith Center M.D.   On: 01/22/2016 20:13    Procedures Procedures (including critical care time)  Medications Ordered in ED Medications  iopamidol (ISOVUE-300) 61 % injection (80 mLs  Contrast Given 01/22/16 1948)     Initial Impression / Assessment and Plan / ED Course  I have reviewed the triage vital signs and the nursing notes.  Pertinent labs & imaging results that were available during my care of the patient were reviewed by me and considered in my medical decision making (see chart for details).  Clinical Course   Patient is hemodynamically stable.  Hemoglobin is stable. CT scan shows no acute findings. Rectal exam is weakly heme positive. Discussed findings with the patient and her daughter. She will follow-up with her gastroenterologist.   Final Clinical Impressions(s) / ED Diagnoses   Final diagnoses:  Rectal bleeding    New Prescriptions New Prescriptions   No medications on file     Donnetta Hutching, MD 01/22/16 2304

## 2016-01-23 ENCOUNTER — Telehealth: Payer: Self-pay | Admitting: Gastroenterology

## 2016-01-23 NOTE — Telephone Encounter (Signed)
This week with an extender or any available MD.

## 2016-01-23 NOTE — Telephone Encounter (Signed)
Please review and advise on how soon she should be seen. Thanks

## 2016-01-23 NOTE — Telephone Encounter (Signed)
Please schedule with an APP per Dr Christella Hartigan. Thank you!

## 2016-01-24 NOTE — Telephone Encounter (Signed)
Scheduled patient for 01-30-16.  Pt is aware of appt

## 2016-01-30 ENCOUNTER — Ambulatory Visit: Payer: PRIVATE HEALTH INSURANCE | Admitting: Gastroenterology

## 2016-02-01 ENCOUNTER — Ambulatory Visit (INDEPENDENT_AMBULATORY_CARE_PROVIDER_SITE_OTHER): Payer: Medicare Other | Admitting: Gastroenterology

## 2016-02-01 ENCOUNTER — Telehealth: Payer: Self-pay | Admitting: Gastroenterology

## 2016-02-01 ENCOUNTER — Encounter: Payer: Self-pay | Admitting: Gastroenterology

## 2016-02-01 ENCOUNTER — Telehealth: Payer: Self-pay | Admitting: Emergency Medicine

## 2016-02-01 VITALS — BP 150/74 | HR 68 | Ht 66.0 in | Wt 151.0 lb

## 2016-02-01 DIAGNOSIS — K5900 Constipation, unspecified: Secondary | ICD-10-CM | POA: Diagnosis not present

## 2016-02-01 DIAGNOSIS — K625 Hemorrhage of anus and rectum: Secondary | ICD-10-CM | POA: Diagnosis not present

## 2016-02-01 DIAGNOSIS — Z7901 Long term (current) use of anticoagulants: Secondary | ICD-10-CM | POA: Diagnosis not present

## 2016-02-01 MED ORDER — NA SULFATE-K SULFATE-MG SULF 17.5-3.13-1.6 GM/177ML PO SOLN: 1.0000 | ORAL | 0 refills | Status: DC

## 2016-02-01 NOTE — Progress Notes (Signed)
02/01/2016 Jacqueline Kim [id number] 1945/08/03   History of Present Illness:  This is a 70 year old female who is producing known to Dr. Juanda Chance for irritable bowel syndrome. Just of note, she was 25 minutes late for her office visit earlier this week because she states that she had fallen asleep. She was rescheduled to today and still showed at 15 minutes late. Anyway, her last colonoscopy was in April 2008 at which time she was found have diverticulosis and external hemorrhoids with repeat recommended in 10 years from that time. She presents to our office today with complaints of rectal bleeding for the past 2 or 3 weeks that has been worse with straining and in varying amounts. She says that she is also constipated and has not moved her bowels in 5 days, but has still seen some blood upon wiping herself and in her underpants as well. Has discomfort in her rectum. Also complains of left lower quadrant discomfort. She was in the emergency department on August 8 and had a CT scan of the abdomen and pelvis with contrast that showed no acute findings; showed diverticulosis without diverticulitis. She was fecal occult blood positive at that time. Hemoglobin was 11.8 grams. She says that MiraLAX does not help her constipation.  She is on Eliquis, which is controlled by her cardiologist, Dr. [name].  She tells me that she held this back in November for her knee replacement without any issues.   Current Medications, Allergies, Past Medical History, Past Surgical History, Family History and Social History were reviewed in [employer] record.   Physical Exam: BP (!) 150/74   Pulse 68   Ht 5\' 6"  (1.676 m)   Wt 151 lb (68.5 kg)   BMI 24.37 kg/m  General: Well developed white female in no acute distress Head: Normocephalic and atraumatic Eyes:  sclerae anicteric, conjunctiva pink  Ears: Normal auditory acuity Lungs: Clear throughout to auscultation Heart: Regular rate and  rhythm Abdomen: Soft, non-distended.  Normal bowel sounds.  Mild left sided TTP.  Low abdominoplasty scar noted. Rectal:  External hemorrhoids noted.  DRE did not reveal any masses.  No stool or blood on exam glove; still tested mucus and was heme negative. Musculoskeletal: Symmetrical with no gross deformities  Extremities: No edema  Neurological: Alert oriented x 4, grossly non-focal Psychological:  Alert and cooperative. Normal mood and affect  Assessment and Recommendations: -70 year old female with long-standing history of irritable bowel now complaining of rectal bleeding for the past 2-3 weeks and constipation. Left lower quadrant abdominal discomfort as well, but CT scan next week negative for diverticulitis and other findings. Last colonoscopy was 8 years ago. Rectal exam today revealed external hemorrhoids but no bleeding noted. We'll proceed with colonoscopy with Dr. 61.  Will plan for 2 day prep due to constipation.  For her acute constipation we will have her obtain a bottle of magnesium citrate and drink that this evening. -Chronic anticoagulation with Eliquis:  Will hold Eliquis for 2 days prior to endoscopic procedures - will instruct when and how to resume after procedure. Benefits and risks of procedure explained including risks of bleeding, perforation, infection, missed lesions, reactions to medications and possible need for hospitalization and surgery for complications. Additional rare but real risk of stroke or other vascular clotting events off of Eliquis also explained and need to seek urgent help if any signs of these problems occur. Will communicate by phone or EMR with patient's prescribing provider, Dr. Lavon Paganini, to confirm  that holding is reasonable in this case.

## 2016-02-01 NOTE — Patient Instructions (Addendum)
Drink bottle of magnesium citrate tonight.  You will be contaced by our office prior to your procedure for directions on holding your Eliquis.  If you do not hear from our office 1 week prior to your scheduled procedure, please call [phone] to discuss.   You have been scheduled for a colonoscopy. Please follow written instructions given to you at your visit today.  Please pick up your prep supplies at the pharmacy within the next 1-3 days. If you use inhalers (even only as needed), please bring them with you on the day of your procedure. Your physician has requested that you go to www.startemmi.com and enter the access code given to you at your visit today. This web site gives a general overview about your procedure. However, you should still follow specific instructions given to you by our office regarding your preparation for the procedure.

## 2016-02-01 NOTE — Progress Notes (Signed)
Reviewed and agree with documentation and assessment and plan. K. Veena Aurianna Earlywine , MD   

## 2016-02-01 NOTE — Telephone Encounter (Signed)
02/01/2016   RE: Jacqueline Kim DOB: 10-09-1945 MRN: [id number]   Dear Dr. [name],    We have scheduled the above patient for an endoscopic procedure. Our records show that she is on anticoagulation therapy.   Please advise as to how long the patient may come off her therapy of Eliquis prior to the procedure, which is scheduled for 02/06/16.  Please fax back/ or route the completed form to Deneise Lever, CMA.   Sincerely,    Deneise Lever, CMA

## 2016-02-01 NOTE — Telephone Encounter (Signed)
Spoke to pharmacy they did not have ins. Card on file. Faxed over to pharmacy.

## 2016-02-03 NOTE — Telephone Encounter (Signed)
Hold eliquis for 2 day prior to procedure

## 2016-02-04 NOTE — Telephone Encounter (Signed)
Patient informed and verbalized understanding

## 2016-02-06 ENCOUNTER — Encounter: Payer: Medicare Other | Admitting: Gastroenterology

## 2016-02-06 ENCOUNTER — Telehealth: Payer: Self-pay | Admitting: Gastroenterology

## 2016-02-06 DIAGNOSIS — K625 Hemorrhage of anus and rectum: Secondary | ICD-10-CM

## 2016-02-06 MED ORDER — NA SULFATE-K SULFATE-MG SULF 17.5-3.13-1.6 GM/177ML PO SOLN: 1.0000 | Freq: Once | ORAL | 0 refills | Status: AC

## 2016-02-06 NOTE — Telephone Encounter (Signed)
Called pt to verify that she did not hold her eliquis, pt states she has been taking her evening dose of 5 mg/ she takes it twice a day, spoke with Dr Lavon Paganini about pt not holding her eliquis to see how she wanted to proceed with colonoscopy, Dr Lavon Paganini advised that the pt could either proceed with procedure and be at a slightly greater risk of bleeding or reschedule procedure when she could be off of eliquis for 2 days as previously advised, called pt back and advised of Dr Lavon Paganini recommendations, pt chose to reschedule procedure for 8/25 which will give her 2 days to be off eliquis, also reinstructed pt on prep instructions, told pt to stay on liquids today and then clear liquids tomorrow, start prep like instructions states on Thursday then Friday morning begin second half at 930 am, nothing to drink after 1130 including gum and candy.pt verbalized understanding of instructions-adm

## 2016-02-08 ENCOUNTER — Ambulatory Visit (AMBULATORY_SURGERY_CENTER): Payer: PRIVATE HEALTH INSURANCE | Admitting: Gastroenterology

## 2016-02-08 ENCOUNTER — Encounter: Payer: Self-pay | Admitting: Gastroenterology

## 2016-02-08 VITALS — BP 111/62 | HR 60 | Temp 98.0°F | Resp 17 | Ht 66.0 in | Wt 151.0 lb

## 2016-02-08 DIAGNOSIS — K625 Hemorrhage of anus and rectum: Secondary | ICD-10-CM | POA: Diagnosis present

## 2016-02-08 DIAGNOSIS — K589 Irritable bowel syndrome without diarrhea: Secondary | ICD-10-CM | POA: Diagnosis not present

## 2016-02-08 MED ORDER — SODIUM CHLORIDE 0.9 % IV SOLN: 500.0000 mL | INTRAVENOUS | Status: DC

## 2016-02-08 NOTE — Patient Instructions (Signed)
YOU HAD AN ENDOSCOPIC PROCEDURE TODAY AT THE [facility]:   Refer to the procedure report that was given to you for any specific questions about what was found during the examination.  If the procedure report does not answer your questions, please call your gastroenterologist to clarify.  If you requested that your care partner not be given the details of your procedure findings, then the procedure report has been included in a sealed envelope for you to review at your convenience later.  YOU SHOULD EXPECT: Some feelings of bloating in the abdomen. Passage of more gas than usual.  Walking can help get rid of the air that was put into your GI tract during the procedure and reduce the bloating. If you had a lower endoscopy (such as a colonoscopy or flexible sigmoidoscopy) you may notice spotting of blood in your stool or on the toilet paper. If you underwent a bowel prep for your procedure, you may not have a normal bowel movement for a few days.  Please Note:  You might notice some irritation and congestion in your nose or some drainage.  This is from the oxygen used during your procedure.  There is no need for concern and it should clear up in a day or so.  SYMPTOMS TO REPORT IMMEDIATELY:   Following lower endoscopy (colonoscopy or flexible sigmoidoscopy):  Excessive amounts of blood in the stool  Significant tenderness or worsening of abdominal pains  Swelling of the abdomen that is new, acute  Fever of 100F or higher  For urgent or emergent issues, a gastroenterologist can be reached at any hour by calling [phone].   DIET:  We do recommend a small meal at first, but then you may proceed to your regular diet.  Drink plenty of fluids but you should avoid alcoholic beverages for 24 hours.  ACTIVITY:  You should plan to take it easy for the rest of today and you should NOT DRIVE or use heavy machinery until tomorrow (because of the sedation medicines used during the test).     FOLLOW UP: Our staff will call the number listed on your records the next business day following your procedure to check on you and address any questions or concerns that you may have regarding the information given to you following your procedure. If we do not reach you, we will leave a message.  However, if you are feeling well and you are not experiencing any problems, there is no need to return our call.  We will assume that you have returned to your regular daily activities without incident.  If any biopsies were taken you will be contacted by phone or by letter within the next 1-3 weeks.  Please call us at [phone] if you have not heard about the biopsies in 3 weeks.    SIGNATURES/CONFIDENTIALITY: You and/or your care partner have signed paperwork which will be entered into your electronic medical record.  These signatures attest to the fact that that the information above on your After Visit Summary has been reviewed and is understood.  Full responsibility of the confidentiality of this discharge information lies with you and/or your care-partner.  Resume Eliquis today. Please read diverticulosis, high fiber diet, and hemorrhoid handouts provided.

## 2016-02-08 NOTE — Progress Notes (Signed)
The patent aw reprot to rn

## 2016-02-08 NOTE — Op Note (Addendum)
[facility] Patient Name: Jacqueline Kim Procedure Date: 02/08/2016 2:35 PM MRN: [id number] Endoscopist: Napoleon Form , MD Age: 70 Referring MD:  Date of Birth: Jan 02, 1946 Gender: Female Account #: [id number] Procedure:                Colonoscopy Indications:              Evaluation of unexplained GI bleeding Medicines:                Monitored Anesthesia Care Procedure:                Pre-Anesthesia Assessment:                           - Prior to the procedure, a History and Physical                            was performed, and patient medications and                            allergies were reviewed. The patient's tolerance of                            previous anesthesia was also reviewed. The risks                            and benefits of the procedure and the sedation                            options and risks were discussed with the patient.                            All questions were answered, and informed consent                            was obtained. Prior Anticoagulants: The patient                            last took Eliquis (apixaban) 1 day prior to the                            procedure. ASA Grade Assessment: II - A patient                            with mild systemic disease. After reviewing the                            risks and benefits, the patient was deemed in                            satisfactory condition to undergo the procedure.                           After obtaining informed consent, the colonoscope  was passed under direct vision. Throughout the                            procedure, the patient's blood pressure, pulse, and                            oxygen saturations were monitored continuously. The                            Model PCF-H190L [id number]) scope was introduced                            through the anus and advanced to the the terminal                            ileum, with  identification of the appendiceal                            orifice and IC valve. The colonoscopy was performed                            without difficulty. The patient tolerated the                            procedure well. The quality of the bowel                            preparation was good. The ileocecal valve,                            appendiceal orifice, and rectum were photographed. Scope In: 2:41:50 PM Scope Out: 2:54:14 PM Scope Withdrawal Time: 0 hours 8 minutes 4 seconds  Total Procedure Duration: 0 hours 12 minutes 24 seconds  Findings:                 The perianal and digital rectal examinations were                            normal.                           Scattered small and large-mouthed diverticula were                            found in the entire colon.                           Non-bleeding internal hemorrhoids were found during                            retroflexion. The hemorrhoids were large.                           The exam was otherwise without abnormality. Complications:            No immediate complications.  Estimated Blood Loss:     Estimated blood loss: none. Impression:               - Diverticulosis in the entire examined colon.                           - Non-bleeding internal hemorrhoids, likely                            etiology for intermittent rectal bleeding                           - The examination was otherwise normal.                           - No specimens collected. Recommendation:           - Patient has a contact number available for                            emergencies. The signs and symptoms of potential                            delayed complications were discussed with the                            patient. Return to normal activities tomorrow.                            Written discharge instructions were provided to the                            patient.                           - Resume previous diet.                            - Continue present medications.                           - Await pathology results.                           - No recommendation at this time regarding repeat                            colonoscopy due to age.                           - Return to GI clinic at the next available                            appointment for hemorrhoidal band ligation. Napoleon Form, MD 02/08/2016 3:02:14 PM This report has been signed electronically. Addendum Number: 1   Addendum Date: 02/08/2016 3:03:30 PM      Ok to restart Eliquis at prior dose as prescribed by  her Primary       physician Napoleon Form, MD 02/08/2016 3:03:58 PM This report has been signed electronically.

## 2016-02-11 ENCOUNTER — Telehealth: Payer: Self-pay | Admitting: *Deleted

## 2016-02-11 NOTE — Telephone Encounter (Signed)
lb  Follow up Call-  Call back number 02/08/2016  Post procedure Call Back phone  # [phone]  Permission to leave phone message Yes  Some recent data might be hidden     Patient questions:  Do you have a fever, pain , or abdominal swelling? No. Pain Score  0 *  Have you tolerated food without any problems? Yes.    Have you been able to return to your normal activities? Yes.    Do you have any questions about your discharge instructions: Diet   No. Medications  No. Follow up visit  No.  Do you have questions or concerns about your Care? No.  Actions: * If pain score is 4 or above: No action needed, pain <4.     Pt. Stated everything is wonderful and everyone was so nice/

## 2016-02-29 ENCOUNTER — Other Ambulatory Visit: Payer: Self-pay | Admitting: *Deleted

## 2016-02-29 MED ORDER — LOSARTAN POTASSIUM 50 MG PO TABS: 50.0000 mg | ORAL_TABLET | Freq: Every day | ORAL | 1 refills | Status: DC

## 2016-02-29 NOTE — Telephone Encounter (Signed)
Rx request sent to pharmacy.  

## 2016-03-11 ENCOUNTER — Encounter: Payer: Self-pay | Admitting: Gastroenterology

## 2016-03-11 ENCOUNTER — Ambulatory Visit (INDEPENDENT_AMBULATORY_CARE_PROVIDER_SITE_OTHER): Payer: Medicare Other | Admitting: Gastroenterology

## 2016-03-11 ENCOUNTER — Telehealth: Payer: Self-pay | Admitting: *Deleted

## 2016-03-11 VITALS — BP 110/70 | HR 68 | Ht 66.0 in | Wt 148.6 lb

## 2016-03-11 DIAGNOSIS — K5909 Other constipation: Secondary | ICD-10-CM

## 2016-03-11 DIAGNOSIS — K641 Second degree hemorrhoids: Secondary | ICD-10-CM | POA: Diagnosis not present

## 2016-03-11 MED ORDER — LINACLOTIDE 72 MCG PO CAPS: 72.0000 ug | ORAL_CAPSULE | Freq: Every day | ORAL | 3 refills | Status: DC

## 2016-03-11 NOTE — Telephone Encounter (Signed)
  03/11/2016   RE: Jacqueline Kim DOB: 01-15-1946 MRN: [id number]   Dear Dr Benny Lennert     We have scheduled the above patient for an endoscopic procedure. Our records show that she is on anticoagulation therapy.   Please advise as to how long the patient may come off her therapy of Eliquis  prior to the procedure and after the procedure, which is scheduled for 03/24/2016.  Please fax back/ or route the completed form to Jacqueline Kim at [phone].   Sincerely,    Merri Ray CMA AAMA

## 2016-03-11 NOTE — Progress Notes (Signed)
Jacqueline Kim    [id number]    07/10/1945  Primary Care Physician:MACKENZIE,BRIAN, MD  Referring Physician: Thayer Headings, MD [address] [place], [place] [address]  Chief complaint:  Constipation  HPI: 70 year old female with h/o irritable bowel syndrome, last seen in office by Doug Sou on 02/01/16 for rectal bleeding here with c/o constipation and symptomatic hemorrhoids. She underwent colonoscopy on 02/08/16 that showed pan colonic diverticulosis and internal hemorrhoids. She continues to have constipation with bowel movement every 2-3 days despite taking stool softeners and MiraLAX. No longer having any blood per rectum but c/o itchining intermittently and sensation of fullness in the rectum with occasional prolapse. She is on Eliquis for history of chronic A. Fib, patient's here today hemorrhoid band ligation but hasn't been instructed to hold Eliquis    Outpatient Encounter Prescriptions as of 03/11/2016  Medication Sig  . apixaban (ELIQUIS) 5 MG TABS tablet Take 1 tablet (5 mg total) by mouth 2 (two) times daily.  Marland Kitchen atenolol (TENORMIN) 50 MG tablet TAKE 1 TABLET BY MOUTH EVERY MORNING. (GENERIC FOR TENORMIN)  . Biotin 5000 MCG TABS Take 1 tablet by mouth daily.  Marland Kitchen dicyclomine (BENTYL) 10 MG capsule TAKE ONE CAPSULE 3 TIMES DAILY BEFORE MEALS  . DILT-CD 120 MG 24 hr capsule TAKE 1 CAPSULE BY MOUTH DAILY AT BEDTIME  . docusate sodium (COLACE) 100 MG capsule Take 1 capsule (100 mg total) by mouth 2 (two) times daily.  Marland Kitchen escitalopram (LEXAPRO) 20 MG tablet Take 20 mg by mouth daily.   . furosemide (LASIX) 20 MG tablet Take 1 tablet (20 mg total) by mouth daily as needed for fluid.  Marland Kitchen HYDROcodone-acetaminophen (NORCO/VICODIN) 5-325 MG tablet Take 1.5 tablets by mouth every morning and 1 tablet lunch as needed for arthritis pain  . levothyroxine (SYNTHROID, LEVOTHROID) 112 MCG tablet Take 112 mcg by mouth daily before breakfast.   . losartan (COZAAR)  50 MG tablet Take 1 tablet (50 mg total) by mouth daily.  . propafenone (RYTHMOL) 150 MG tablet TAKE 1 AND 1/2 TABLETS BY MOUTH IN THE MORNING, TAKE 1 TABLET MIDDAY AND TAKE 1 AND 1/2 TABLETS IN THE EVENING (GENERIC FOR RYTHMOL)  . traZODone (DESYREL) 150 MG tablet Take 75 mg by mouth at bedtime.   . zaleplon (SONATA) 10 MG capsule Take 10 mg by mouth at bedtime.   Marland Kitchen linaclotide (LINZESS) 72 MCG capsule Take 1 capsule (72 mcg total) by mouth daily before breakfast.  . [DISCONTINUED] acetaminophen (TYLENOL) 325 MG tablet Take 1-2 tablets (325-650 mg total) by mouth every 6 (six) hours as needed for moderate pain.   Facility-Administered Encounter Medications as of 03/11/2016  Medication  . 0.9 %  sodium chloride infusion    Allergies as of 03/11/2016 - Review Complete 02/08/2016  Allergen Reaction Noted  . Azithromycin Other (See Comments) 03/28/2014  . Epinephrine Other (See Comments) 08/30/2012  . Penicillins Anaphylaxis 05/27/2011  . Metoprolol  07/13/2012  . Lidocaine Palpitations 07/13/2012    Past Medical History:  Diagnosis Date  . Anticoagulant long-term use  WITH PRADAXA 05/27/2011  . Anxiety   . Arthritis   . Cellulitis of buttock, left 12/20/2014  . Complication of anesthesia    DIFFICULTY WAKING UP  . Depression   . Diverticulosis   . DJD (degenerative joint disease)   . Esophagitis 09/17/06  . External hemorrhoids   . GERD (gastroesophageal reflux disease)   . Glaucoma   . History of  stress test    a. 2008 - normal nuc.  Marland Kitchen Hypertension   . Hypothyroid   . IBS (irritable bowel syndrome)   . Paroxysmal a-fib (HCC) diagnosed 2008   a. On propafenone, Pradaxa.  . Presence of permanent cardiac pacemaker   . 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 BREAST REDUCTION SURGERY    . CARDIOVERSION N/A 08/02/2014   Procedure: CARDIOVERSION;  Surgeon: Quintella Reichert, MD;   Location: [facility];  Service: Cardiovascular;  Laterality: N/A;  . CATARACT EXTRACTION, BILATERAL Bilateral   . FACIAL COSMETIC SURGERY    . FOOT SURGERY Left   . HAND SURGERY     multiple  . LIPOSUCTION    . NM MYOCAR PERF WALL MOTION  12/29/2006   No significant ischemia; EF 69%  . OVARIAN CYST REMOVAL    . PACEMAKER INSERTION  04/08/11   MDT Revo implanted by Dr Rubie Maid  . TONSILLECTOMY    . TOTAL KNEE ARTHROPLASTY Left 05/01/2015   Procedure: TOTAL LEFT KNEE ARTHROPLASTY;  Surgeon: Durene Romans, MD;  Location: [facility];  Service: Orthopedics;  Laterality: Left;  Marland Kitchen VAGINAL HYSTERECTOMY      Family History  Problem Relation Age of Onset  . Anesthesia problems Mother   . Colon polyps Mother     beign  . Colon cancer Neg Hx     Social History   Social History  . Marital status: Legally Separated    Spouse name: N/A  . Number of children: 3  . Years of education: N/A   Occupational History  . retired Retired   Social History Main Topics  . Smoking status: Former Smoker    Quit date: 08/30/1996  . Smokeless tobacco: Never Used  . Alcohol use 3.6 oz/week    6 Glasses of wine per week     Comment: daily  . Drug use: No  . Sexual activity: Yes   Other Topics Concern  . Not on file   Social History Narrative   Lives in [place],  Former [employer],  Separated.        Review of systems: Review of Systems  Constitutional: Negative for fever and chills.  HENT: Negative.   Eyes: Negative for blurred vision.  Respiratory: Negative for cough, shortness of breath and wheezing.   Cardiovascular: Negative for chest pain and palpitations.  Gastrointestinal: as per HPI Genitourinary: Negative for dysuria, urgency, frequency and hematuria.  Musculoskeletal: Negative for myalgias, back pain and joint pain.  Skin: Negative for itching and rash.  Neurological: Negative for dizziness, tremors, focal weakness, seizures and loss of consciousness.    Endo/Heme/Allergies: Positive for seasonal allergies.  Psychiatric/Behavioral: Negative for depression, suicidal ideas and hallucinations.  All other systems reviewed and are negative.   Physical Exam: Vitals:   03/11/16 1530  BP: 110/70  Pulse: 68   Body mass index is 23.98 kg/m. Gen:      No acute distress HEENT:  EOMI, sclera anicteric Neck:     No masses; no thyromegaly Lungs:    Clear to auscultation bilaterally; normal respiratory effort CV:         Regular rate and rhythm; no murmurs Abd:      + bowel sounds; soft, non-tender; no palpable masses, no distension Ext:    No edema; adequate peripheral perfusion Skin:      Warm and dry; no rash Neuro: alert and oriented  x 3 Psych: normal mood and affect  Data Reviewed:  Reviewed labs, radiology imaging, old records and pertinent past GI work up   Assessment and Plan/Recommendations: 35 yr F with chronic constipation and symptomatic hemorrhoid here for follow up Given worsening constipation despite Miralax and OTC laxative, will start Linzess 72 mcg daily and adjust dose based on response Start Benfiber 1 table spoon TID with meals Start Probiotic Align 1 capsule daily Will reschedule hemorrhoidal band ligation, get clearance from prescribing MD to hold Eliquis 2 days prior and 4 days after the procedure to prevent bleeding   25 minutes was spent face-to-face with the patient. Greater than 50% of the time used for counseling as well as treatment plan and follow-up. She had multiple questions which were answered to her satisfaction  Iona Beard , MD [phone] Mon-Fri 8a-5p [phone] after 5p, weekends, holidays  CC: Thayer Headings, MD

## 2016-03-11 NOTE — Patient Instructions (Signed)
Your Hemorrhoidal banding appointment is scheduled on 03/24/2016 at 3:15pm  We will contact you about holding your Eliquis before the banding appointment  We will send Linzess tou your pharmacy  Use Benefiber 1 tablespoon three times a day with meals

## 2016-03-12 ENCOUNTER — Encounter: Payer: Self-pay | Admitting: Gastroenterology

## 2016-03-18 ENCOUNTER — Encounter: Payer: Self-pay | Admitting: Cardiovascular Disease

## 2016-03-18 ENCOUNTER — Ambulatory Visit (INDEPENDENT_AMBULATORY_CARE_PROVIDER_SITE_OTHER): Payer: PRIVATE HEALTH INSURANCE | Admitting: Cardiovascular Disease

## 2016-03-18 VITALS — BP 127/76 | HR 66 | Ht 66.0 in | Wt 147.4 lb

## 2016-03-18 DIAGNOSIS — I480 Paroxysmal atrial fibrillation: Secondary | ICD-10-CM

## 2016-03-18 DIAGNOSIS — I495 Sick sinus syndrome: Secondary | ICD-10-CM | POA: Diagnosis not present

## 2016-03-18 DIAGNOSIS — Z950 Presence of cardiac pacemaker: Secondary | ICD-10-CM | POA: Diagnosis not present

## 2016-03-18 NOTE — Progress Notes (Signed)
Cardiology Office Note    Date:  03/19/2016   ID:  Jacqueline Kim, DOB [birth year], MRN [id number]  PCP:  Jacqueline Headings, MD  Cardiologist:  Jacqueline Guadalajara, MD; Jacqueline Fair, MD   Chief Complaint  Patient presents with  . Follow-up    pt has no complaints today    History of Present Illness:  Jacqueline Kim is a 70 y.o. female with paroxysmal atrial fibrillation and tachy-brady sd., here for pacemaker check.  She feels great and has not had palpitations. Propafenone has offered excellent control. No palpitations, bleeding, neuro events, dyspnea or angina has occurred.  Medtronic Revo (2012) device is functioning normally. The current burden of atrial fibrillation is zero. She had two brief episodes of PAT in February (in aggregate under one minute).  She has 87% atrial pacing due to sinus bradycardia, but no V pacing. Battery voltage is 2.99V (RRT 2.81V).  Past Medical History:  Diagnosis Date  . Anticoagulant long-term use  WITH PRADAXA 05/27/2011  . Anxiety   . Arthritis   . Cellulitis of buttock, left 12/20/2014  . Complication of anesthesia    DIFFICULTY WAKING UP  . Depression   . Diverticulosis   . DJD (degenerative joint disease)   . Esophagitis 09/17/06  . External hemorrhoids   . GERD (gastroesophageal reflux disease)   . Glaucoma   . History of stress test    a. 2008 - normal nuc.  Marland Kitchen Hypertension   . Hypothyroid   . IBS (irritable bowel syndrome)   . Paroxysmal a-fib (HCC) diagnosed 2008   a. On propafenone, Pradaxa.  . Presence of permanent cardiac pacemaker   . 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 BREAST REDUCTION SURGERY    . CARDIOVERSION N/A 08/02/2014   Procedure: CARDIOVERSION;  Surgeon: Jacqueline Reichert, MD;  Location: [facility];  Service: Cardiovascular;  Laterality: N/A;  . CATARACT EXTRACTION, BILATERAL Bilateral   . FACIAL COSMETIC SURGERY      . FOOT SURGERY Left   . HAND SURGERY     multiple  . LIPOSUCTION    . NM MYOCAR PERF WALL MOTION  12/29/2006   No significant ischemia; EF 69%  . OVARIAN CYST REMOVAL    . PACEMAKER INSERTION  04/08/11   MDT Revo implanted by Jacqueline Kim  . TONSILLECTOMY    . TOTAL KNEE ARTHROPLASTY Left 05/01/2015   Procedure: TOTAL LEFT KNEE ARTHROPLASTY;  Surgeon: Jacqueline Romans, MD;  Location: [facility];  Service: Orthopedics;  Laterality: Left;  Marland Kitchen VAGINAL HYSTERECTOMY      Current Medications: Outpatient Medications Prior to Visit  Medication Sig Dispense Refill  . apixaban (ELIQUIS) 5 MG TABS tablet Take 1 tablet (5 mg total) by mouth 2 (two) times daily. 8 tablet 0  . atenolol (TENORMIN) 50 MG tablet TAKE 1 TABLET BY MOUTH EVERY MORNING. (GENERIC FOR TENORMIN) 90 tablet 3  . Biotin 5000 MCG TABS Take 1 tablet by mouth daily.    Marland Kitchen dicyclomine (BENTYL) 10 MG capsule TAKE ONE CAPSULE 3 TIMES DAILY BEFORE MEALS 90 capsule 1  . DILT-CD 120 MG 24 hr capsule TAKE 1 CAPSULE BY MOUTH DAILY AT BEDTIME 90 capsule 2  . docusate sodium (COLACE) 100 MG capsule Take 1 capsule (100 mg total) by mouth 2 (two) times daily. 10 capsule 0  . escitalopram (LEXAPRO) 20 MG tablet Take 20 mg by mouth daily.     Marland Kitchen  furosemide (LASIX) 20 MG tablet Take 1 tablet (20 mg total) by mouth daily as needed for fluid. 30 tablet   . HYDROcodone-acetaminophen (NORCO/VICODIN) 5-325 MG tablet Take 1.5 tablets by mouth every morning and 1 tablet lunch as needed for arthritis pain    . levothyroxine (SYNTHROID, LEVOTHROID) 112 MCG tablet Take 112 mcg by mouth daily before breakfast.     . linaclotide (LINZESS) 72 MCG capsule Take 1 capsule (72 mcg total) by mouth daily before breakfast. 30 capsule 3  . losartan (COZAAR) 50 MG tablet Take 1 tablet (50 mg total) by mouth daily. 90 tablet 1  . propafenone (RYTHMOL) 150 MG tablet TAKE 1 AND 1/2 TABLETS BY MOUTH IN THE MORNING, TAKE 1 TABLET MIDDAY AND TAKE 1 AND 1/2 TABLETS IN THE EVENING  (GENERIC FOR RYTHMOL) 360 tablet 1  . traZODone (DESYREL) 150 MG tablet Take 75 mg by mouth at bedtime.     . zaleplon (SONATA) 10 MG capsule Take 10 mg by mouth at bedtime.      Facility-Administered Medications Prior to Visit  Medication Dose Route Frequency Provider Last Rate Last Dose  . 0.9 %  sodium chloride infusion  500 mL Intravenous Continuous Napoleon Form, MD         Allergies:   Azithromycin; Epinephrine; Penicillins; Lidocaine; and Metoprolol   Social History   Social History  . Marital status: Legally Separated    Spouse name: N/A  . Number of children: 3  . Years of education: N/A   Occupational History  . retired Retired   Social History Main Topics  . Smoking status: Former Smoker    Quit date: 08/30/1996  . Smokeless tobacco: Never Used  . Alcohol use 3.6 oz/week    6 Glasses of wine per week     Comment: daily  . Drug use: No  . Sexual activity: Yes   Other Topics Concern  . Not on file   Social History Narrative   Lives in [place],  Former [employer],  Separated.       Family History:  The patient's family history includes Anesthesia problems in her mother; Colon polyps in her mother.   ROS:   Please see the history of present illness.    ROS All other systems reviewed and are negative.   PHYSICAL EXAM:   VS:  BP 127/76 (BP Location: Left Arm, Patient Position: Sitting, Cuff Size: Normal)   Pulse 66   Ht 5\' 6"  (1.676 m)   Wt 66.9 kg (147 lb 6.4 oz)   SpO2 100%   BMI 23.79 kg/m    GEN: Well nourished, well developed, in no acute distress  HEENT: normal  Neck: no JVD, carotid bruits, or masses Cardiac: RRR; no murmurs, rubs, or gallops,no edema , healthy left subclavian pacemaker site Respiratory:  clear to auscultation bilaterally, normal work of breathing GI: soft, nontender, nondistended, + BS MS: no deformity or atrophy  Skin: warm and dry, no rash Neuro:  Alert and Oriented x 3, Strength and sensation are  intact Psych: euthymic mood, full affect  Wt Readings from Last 3 Encounters:  03/18/16 66.9 kg (147 lb 6.4 oz)  03/11/16 67.4 kg (148 lb 9.6 oz)  02/08/16 68.5 kg (151 lb)      Studies/Labs Reviewed:   EKG:  EKG is ordered today.  The ekg ordered today demonstrates A paced, V sensed rhythm with long AV delay 270 ms and mildly prolonged QTc 473 ms.  Recent Labs: 01/22/2016: ALT  14; BUN 16; Creatinine, Ser 1.13; Hemoglobin 11.8; Platelets 239; Potassium 4.1; Sodium 134   Lipid Panel    Component Value Date/Time   CHOL  12/19/2006 0315    172        ATP III CLASSIFICATION:  <200     mg/dL   Desirable  195-093  mg/dL   Borderline High  >=267    mg/dL   High   TRIG 124 (H) 58/02/9832 0315   HDL 37 (L) 12/19/2006 0315   CHOLHDL 4.6 12/19/2006 0315   VLDL 59 (H) 12/19/2006 0315   LDLCALC  12/19/2006 0315    76        Total Cholesterol/HDL:CHD Risk Coronary Heart Disease Risk Table                     Men   Women  1/2 Average Risk   3.4   3.3    Additional studies/ records that were reviewed today include:  Notes from Jacqueline. [name]    ASSESSMENT:    1. Paroxysmal atrial fibrillation (HCC)   2. Pacemaker   3. Sinus node dysfunction (HCC)      PLAN:  In order of problems listed above:  1. AFib: Low burden,none in > 1 year, rare PAT with 1:1 conduction. CHADSVasc at least 3, on anticoagulation. Mildly prolonged QT due to propafenone. 2. SSS: appropriate sensor settings by heart rate histogram 3. PPM:  Remote download Q3 months, yearly office evaluation.  Medication Adjustments/Labs and Tests Ordered: Current medicines are reviewed at length with the patient today.  Concerns regarding medicines are outlined above.  Medication changes, Labs and Tests ordered today are listed in the Patient Instructions below. Patient Instructions  Jacqueline Royann Shivers recommends that you schedule a follow-up appointment in 6 months with a device check. You will receive a reminder letter in the mail  two months in advance. If you don't receive a letter, please call our office to schedule the follow-up appointment.  If you need a refill on your cardiac medications before your next appointment, please call your pharmacy.    Signed, Jacqueline Fair, MD  03/19/2016 10:21 AM    [facility] [address] [place], [place], [place]  [address] Phone: [phone]; Fax: [phone]

## 2016-03-18 NOTE — Patient Instructions (Signed)
Dr Croitoru recommends that you schedule a follow-up appointment in 6 months with a device check. You will receive a reminder letter in the mail two months in advance. If you don't receive a letter, please call our office to schedule the follow-up appointment.  If you need a refill on your cardiac medications before your next appointment, please call your pharmacy. 

## 2016-03-19 ENCOUNTER — Encounter: Payer: Self-pay | Admitting: Cardiovascular Disease

## 2016-03-19 LAB — CUP PACEART INCLINIC DEVICE CHECK
Battery Voltage: 2.99 V
Brady Statistic AS VS Percent: 13.4 %
Implantable Lead Implant Date: 20121023
Implantable Lead Location: 753859
Implantable Lead Model: 5086
Lead Channel Impedance Value: 848 Ohm
Lead Channel Sensing Intrinsic Amplitude: 2 mV
MDC IDC LEAD IMPLANT DT: 20121023
MDC IDC LEAD LOCATION: 753860
MDC IDC LEAD MODEL: 5086
MDC IDC MSMT LEADCHNL RA IMPEDANCE VALUE: 480 Ohm
MDC IDC MSMT LEADCHNL RV SENSING INTR AMPL: 8.703 mV
MDC IDC SESS DTM: 20171003144839
MDC IDC SET LEADCHNL RA PACING AMPLITUDE: 2 V
MDC IDC SET LEADCHNL RV PACING AMPLITUDE: 2.5 V
MDC IDC SET LEADCHNL RV PACING PULSEWIDTH: 0.6 ms
MDC IDC SET LEADCHNL RV SENSING SENSITIVITY: 0.9 mV
MDC IDC STAT BRADY AP VP PERCENT: 0.01 %
MDC IDC STAT BRADY AP VS PERCENT: 86.6 %
MDC IDC STAT BRADY AS VP PERCENT: 0 %
MDC IDC STAT BRADY RA PERCENT PACED: 86.6 %
MDC IDC STAT BRADY RV PERCENT PACED: 0.01 %

## 2016-03-19 NOTE — Telephone Encounter (Signed)
2nd request , need to know if it is ok for patient to hold blood thinner

## 2016-03-20 NOTE — Telephone Encounter (Signed)
Hold eliquis for 48 hrs prior to procedure

## 2016-03-20 NOTE — Telephone Encounter (Signed)
Called patient to inform ok to hold eliquis 48 hours before, she will discuss with Dr Lavon Paganini when to resume

## 2016-03-24 ENCOUNTER — Encounter: Payer: Medicare Other | Admitting: Gastroenterology

## 2016-03-24 ENCOUNTER — Telehealth: Payer: Self-pay | Admitting: Gastroenterology

## 2016-03-24 NOTE — Telephone Encounter (Signed)
Patient sounds upset. She forgot to stop her blood thinner and had to cancel the banding planned for today. She took her Linzess at bedtime and was up all night. Rescheduled the appointment to her satisfaction. Discussed how to take the Linzess 30 minutes before the first meal of the day.

## 2016-03-27 ENCOUNTER — Telehealth: Payer: Self-pay | Admitting: Cardiovascular Disease

## 2016-03-27 NOTE — Telephone Encounter (Signed)
Pt calling requesting an authorization for Eliquis 5 mg tablet be sent in to her pharmacy, [facility]. Pt would like a call back. Please advise

## 2016-03-28 MED ORDER — APIXABAN 5 MG PO TABS: 5.0000 mg | ORAL_TABLET | Freq: Two times a day (BID) | ORAL | 12 refills | Status: DC

## 2016-03-28 NOTE — Telephone Encounter (Signed)
Spoke with pt, Refill sent to the pharmacy electronically.  

## 2016-03-31 ENCOUNTER — Telehealth: Payer: Self-pay | Admitting: Cardiovascular Disease

## 2016-03-31 NOTE — Telephone Encounter (Signed)
Called [employer] prior authorization for Eliquis  Dx paroxysmal. Atrial fib.  icd code  I48.0  Spoke to represetative  APPROVE FOR Jun 15 2017  PA# [id number]  NOTIFIED  PHARMACY

## 2016-03-31 NOTE — Telephone Encounter (Signed)
Pt needs prior authorization for her Eliquis-Please [phone].

## 2016-04-21 ENCOUNTER — Encounter: Payer: Medicare Other | Admitting: Gastroenterology

## 2016-04-23 DIAGNOSIS — E039 Hypothyroidism, unspecified: Secondary | ICD-10-CM | POA: Diagnosis not present

## 2016-04-23 DIAGNOSIS — I10 Essential (primary) hypertension: Secondary | ICD-10-CM | POA: Diagnosis not present

## 2016-04-23 DIAGNOSIS — Z0001 Encounter for general adult medical examination with abnormal findings: Secondary | ICD-10-CM | POA: Diagnosis not present

## 2016-04-29 DIAGNOSIS — I4891 Unspecified atrial fibrillation: Secondary | ICD-10-CM | POA: Diagnosis not present

## 2016-04-29 DIAGNOSIS — Z0000 Encounter for general adult medical examination without abnormal findings: Secondary | ICD-10-CM | POA: Diagnosis not present

## 2016-04-29 DIAGNOSIS — D709 Neutropenia, unspecified: Secondary | ICD-10-CM | POA: Diagnosis not present

## 2016-05-02 ENCOUNTER — Telehealth: Payer: Self-pay | Admitting: *Deleted

## 2016-05-02 NOTE — Telephone Encounter (Signed)
Rescheduled patient for the second time  Will hold blood thinner 48 hours before banding

## 2016-05-12 ENCOUNTER — Encounter: Payer: Self-pay | Admitting: Gastroenterology

## 2016-05-12 ENCOUNTER — Ambulatory Visit (INDEPENDENT_AMBULATORY_CARE_PROVIDER_SITE_OTHER): Payer: Medicare Other | Admitting: Gastroenterology

## 2016-05-12 VITALS — BP 106/70 | HR 72 | Ht 66.0 in | Wt 148.4 lb

## 2016-05-12 DIAGNOSIS — M150 Primary generalized (osteo)arthritis: Secondary | ICD-10-CM | POA: Diagnosis not present

## 2016-05-12 DIAGNOSIS — M2550 Pain in unspecified joint: Secondary | ICD-10-CM | POA: Diagnosis not present

## 2016-05-12 DIAGNOSIS — K641 Second degree hemorrhoids: Secondary | ICD-10-CM | POA: Diagnosis not present

## 2016-05-12 DIAGNOSIS — R768 Other specified abnormal immunological findings in serum: Secondary | ICD-10-CM | POA: Diagnosis not present

## 2016-05-12 NOTE — Progress Notes (Signed)
PROCEDURE NOTE: The patient presents with symptomatic grade II  hemorrhoids, requesting rubber band ligation of his/her hemorrhoidal disease.  All risks, benefits and alternative forms of therapy were described and informed consent was obtained.  In the Left Lateral Decubitus position anoscopic examination revealed grade II hemorrhoids in the R anterior, R posterior and L lateral position(s).  The anorectum was pre-medicated with 0.125% Nitroglycerine and Recticare The decision was made to band the R anterior internal hemorrhoid, and the [facility] O'Regan System was used to perform band ligation without complication.  Digital anorectal examination was then performed to assure proper positioning of the band, and to adjust the banded tissue as required.  The patient was discharged home without pain or other issues.  Dietary and behavioral recommendations were given and along with follow-up instructions.     The following adjunctive treatments were recommended: Continue to hold Eliquis this evening and restart tomorrow morning at prior dose  The patient will return in 2-4 weeks for  follow-up and possible additional banding as required. No complications were encountered and the patient tolerated the procedure well.  Iona Beard , MD [phone] Mon-Fri 8a-5p [phone] after 5p, weekends, holidays

## 2016-05-12 NOTE — Patient Instructions (Signed)

## 2016-06-02 DIAGNOSIS — M21611 Bunion of right foot: Secondary | ICD-10-CM | POA: Diagnosis not present

## 2016-06-02 DIAGNOSIS — M2021 Hallux rigidus, right foot: Secondary | ICD-10-CM | POA: Diagnosis not present

## 2016-06-04 ENCOUNTER — Telehealth: Payer: Self-pay | Admitting: *Deleted

## 2016-06-04 NOTE — Telephone Encounter (Signed)
Requesting surgical clearance:   1. Type of surgery: Right hallux MJP ARTHRODESIS AND BUNIONECTOMY  2. Surgeon: Toni Arthurs  3. Surgical date: PENDING CLEARANCE  4. Medications that need to be held: Eliquis                         Special instructions:

## 2016-06-05 ENCOUNTER — Telehealth: Payer: Self-pay | Admitting: Gastroenterology

## 2016-06-05 NOTE — Telephone Encounter (Signed)
Spoke to patient, she has not had a bowel movement for last 5 days. Tried taking her Linzess, last dose at 10:30 today, used OTC stool softener (lifeline) and bisacodyl today too. Last two hours she has vomited 3 times, brown in color. She has not been eating much, but able to drink water. Please advise.

## 2016-06-05 NOTE — Telephone Encounter (Signed)
Spoke to patient, went over Dr. Elana Alm recommendations. Call back if she has questions or concerns.

## 2016-06-05 NOTE — Telephone Encounter (Signed)
Please advise her to do glycerine suppository, clear liquids for today and start bowel purge with Miralax and gatorade

## 2016-06-17 NOTE — Telephone Encounter (Signed)
OK to hold eliquis for 48 hrs prior to surgery

## 2016-06-17 NOTE — Telephone Encounter (Signed)
Faxed clearance via EPIC to Dr Victorino Dike.

## 2016-06-24 ENCOUNTER — Telehealth: Payer: Self-pay | Admitting: *Deleted

## 2016-06-24 NOTE — Telephone Encounter (Signed)
L/M for patient to contact the office to make sure she has help her Eliquis 48 hours before her procedure

## 2016-06-24 NOTE — Telephone Encounter (Signed)
Patient returned call, she stopped taking her   Last dose was Sunday  All day Monday and all day Tuesday off Eliquis

## 2016-06-25 ENCOUNTER — Ambulatory Visit (INDEPENDENT_AMBULATORY_CARE_PROVIDER_SITE_OTHER): Payer: Medicare Other | Admitting: Gastroenterology

## 2016-06-25 ENCOUNTER — Encounter: Payer: Self-pay | Admitting: Gastroenterology

## 2016-06-25 VITALS — BP 112/68 | HR 4 | Ht 66.0 in | Wt 148.0 lb

## 2016-06-25 DIAGNOSIS — K641 Second degree hemorrhoids: Secondary | ICD-10-CM | POA: Diagnosis not present

## 2016-06-25 NOTE — Progress Notes (Signed)
PROCEDURE NOTE: The patient presents with symptomatic grade II  hemorrhoids, requesting rubber band ligation of his/her hemorrhoidal disease.  All risks, benefits and alternative forms of therapy were described and informed consent was obtained.   The anorectum was pre-medicated with 0.125% Nitroglycerine and Recticare The decision was made to band the Right posterior internal hemorrhoid, and the [facility] O'Regan System was used to perform band ligation without complication.  Digital anorectal examination was then performed to assure proper positioning of the band, and to adjust the banded tissue as required.  The patient was discharged home without pain or other issues.  Dietary and behavioral recommendations were given and along with follow-up instructions.     The following adjunctive treatments were recommended: Restart Eliquis Tomorrow AM HOLD Eliquis 24 hours prior to next banding, will need to get approval from prescribing physician prior to holding anticoagulants  The patient will return in 2-4 weeks for  follow-up and possible additional banding as required. No complications were encountered and the patient tolerated the procedure well.  Iona Beard , MD [phone] Mon-Fri 8a-5p [phone] after 5p, weekends, holidays

## 2016-06-25 NOTE — Patient Instructions (Signed)

## 2016-07-07 ENCOUNTER — Telehealth: Payer: Self-pay | Admitting: Cardiovascular Disease

## 2016-07-07 MED ORDER — ATENOLOL 50 MG PO TABS: ORAL_TABLET | ORAL | 3 refills | Status: DC

## 2016-07-07 MED ORDER — DILTIAZEM HCL ER COATED BEADS 120 MG PO CP24: ORAL_CAPSULE | ORAL | 3 refills | Status: DC

## 2016-07-07 NOTE — Telephone Encounter (Signed)
Ms. Vanderford is calling because she has several questions about her medication and surgery. Please call, thanks.

## 2016-07-07 NOTE — Telephone Encounter (Signed)
Returned call to patient.She stated she ran out of atenolol and diltiazem,has not taken in the past 3 to 4 days.Stated she gets medications thru mail order and refills have not came in yet.Stated she is scheduled to have foot surgery tomorrow and wanted to make sure she is ok to have surgery.Advised to restart medications today as prescribed.Refills sent to [facility] pharmacy.Advised to call office next time for local refills.Advised her heart rate and B/P will be checked before surgery.

## 2016-07-25 ENCOUNTER — Encounter: Payer: Self-pay | Admitting: Gastroenterology

## 2016-07-25 ENCOUNTER — Ambulatory Visit (INDEPENDENT_AMBULATORY_CARE_PROVIDER_SITE_OTHER): Payer: Medicare Other | Admitting: Gastroenterology

## 2016-07-25 ENCOUNTER — Encounter: Payer: Medicare Other | Admitting: Gastroenterology

## 2016-07-25 ENCOUNTER — Telehealth: Payer: Self-pay

## 2016-07-25 VITALS — BP 116/70 | HR 64

## 2016-07-25 DIAGNOSIS — K641 Second degree hemorrhoids: Secondary | ICD-10-CM

## 2016-07-25 NOTE — Telephone Encounter (Signed)
Spoke with patient - advised to restart Eliquis tomorrow, Saturday 07/26/16.  ggm

## 2016-07-25 NOTE — Patient Instructions (Addendum)
HEMORRHOID BANDING PROCEDURE    FOLLOW-UP CARE   1. The procedure you have had should have been relatively painless since the banding of the area involved does not have nerve endings and there is no pain sensation.  The rubber band cuts off the blood supply to the hemorrhoid and the band may fall off as soon as 48 hours after the banding (the band may occasionally be seen in the toilet bowl following a bowel movement). You may notice a temporary feeling of fullness in the rectum which should respond adequately to plain Tylenol or Motrin.  2. Following the banding, avoid strenuous exercise that evening and resume full activity the next day.  A sitz bath (soaking in a warm tub) or bidet is soothing, and can be useful for cleansing the area after bowel movements.     3. To avoid constipation, take two tablespoons of natural wheat bran, natural oat bran, flax, Benefiber or any over the counter fiber supplement and increase your water intake to 7-8 glasses daily.    4. Unless you have been prescribed anorectal medication, do not put anything inside your rectum for two weeks: No suppositories, enemas, fingers, etc.  5. Occasionally, you may have more bleeding than usual after the banding procedure.  This is often from the untreated hemorrhoids rather than the treated one.  Don't be concerned if there is a tablespoon or so of blood.  If there is more blood than this, lie flat with your bottom higher than your head and apply an ice pack to the area. If the bleeding does not stop within a half an hour or if you feel faint, call our office at [phone] or go to the emergency room.  6. Problems are not common; however, if there is a substantial amount of bleeding, severe pain, chills, fever or difficulty passing urine (very rare) or other problems, you should call us at [phone] or report to the nearest emergency room.  7. Do not stay seated continuously for more than 2-3 hours for a day or two  after the procedure.  Tighten your buttock muscles 10-15 times every two hours and take 10-15 deep breaths every 1-2 hours.  Do not spend more than a few minutes on the toilet if you cannot empty your bowel; instead re-visit the toilet at a later time.  Restart Eliquis tomorrow.   Thank you for choosing me and [facility].  Dr. Angelena Sole

## 2016-07-25 NOTE — Progress Notes (Signed)
PROCEDURE NOTE: The patient presents with symptomatic grade II hemorrhoids, requesting rubber band ligation of his/her hemorrhoidal disease.  All risks, benefits and alternative forms of therapy were described and informed consent was obtained.   The anorectum was pre-medicated with 0.125% Nitroglycerine The decision was made to band the Left lateral internal hemorrhoid, and the [facility] O'Regan System was used to perform band ligation without complication.  Digital anorectal examination was then performed to assure proper positioning of the band, and to adjust the banded tissue as required.  The patient was discharged home without pain or other issues.  Dietary and behavioral recommendations were given and along with follow-up instructions.     The following adjunctive treatments were recommended: Restart Eliquis tomorrow  The patient will return for  follow-up and possible additional banding as required. No complications were encountered and the patient tolerated the procedure well.  Iona Beard , MD [phone] Mon-Fri 8a-5p [phone] after 5p, weekends, holidays

## 2016-08-04 ENCOUNTER — Other Ambulatory Visit: Payer: Self-pay | Admitting: Cardiovascular Disease

## 2016-08-07 ENCOUNTER — Telehealth: Payer: Self-pay | Admitting: Cardiovascular Disease

## 2016-08-07 NOTE — Telephone Encounter (Signed)
Forward Barnetta Chapel to Dr [name]

## 2016-08-07 NOTE — Telephone Encounter (Signed)
New message       Request for surgical clearance:  What type of surgery is being performed? Breast biopsy When is this surgery scheduled?  08-11-16 1. Are there any medications that need to be held prior to surgery and how long?  Hold eliquis prior to biopsy?  2. Name of physician performing surgery?  Not sure which dr will do it  What is your office phone and fax number?  Fax [phone]

## 2016-08-07 NOTE — Telephone Encounter (Signed)
Patient is stable for procedure.  Hold eliquis of 48 hours prior to the biopsy.

## 2016-08-08 ENCOUNTER — Encounter: Payer: Self-pay | Admitting: *Deleted

## 2016-08-08 NOTE — Telephone Encounter (Signed)
Surgical clearance letter for breast biopsy faxed to [facility] attention Angelica @ [phone].

## 2016-08-08 NOTE — Telephone Encounter (Signed)
Follow up      Nurse is calling because she has not received a fax regarding holding eliquis prior to surgery.  Surgery is Monday and they need to know if it needs to be cancelled.  Please fax clearance on holding eliquis or call if there is a problem

## 2016-09-17 ENCOUNTER — Other Ambulatory Visit: Payer: Self-pay | Admitting: Cardiovascular Disease

## 2016-09-18 NOTE — Telephone Encounter (Signed)
REFILL 

## 2016-09-25 ENCOUNTER — Other Ambulatory Visit: Payer: Self-pay | Admitting: Cardiovascular Disease

## 2016-09-25 NOTE — Telephone Encounter (Signed)
New Message   *STAT* If patient is at the pharmacy, call can be transferred to refill team.   1. Which medications need to be refilled? (please list name of each medication and dose if known) Propafenone 150mg    2. Which pharmacy/location (including street and city if local pharmacy) is medication to be sent to? [facility]   3. Do they need a 30 day or 90 day supply? 30.  Per pt is out of medication and has missed one dosage. Please call back to discuss

## 2016-09-26 ENCOUNTER — Telehealth: Payer: Self-pay

## 2016-09-26 MED ORDER — PROPAFENONE HCL 150 MG PO TABS: ORAL_TABLET | ORAL | 1 refills | Status: DC

## 2016-09-26 NOTE — Telephone Encounter (Signed)
Medication Detail    Disp Refills Start End   propafenone (RYTHMOL) 150 MG tablet 120 tablet 1 09/26/2016    Sig: TAKE 1 AND 1/2 TABLETS BY MOUTH IN THE MORNING, TAKE 1 TABLET MIDDAY AND TAKE 1 AND 1/2 TABLETS IN THE EVENING (GENERIC FOR RYTHMOL)   E-Prescribing Status: Receipt confirmed by pharmacy (09/26/2016 1:05 PM EDT)    Patient notified of refill

## 2016-09-26 NOTE — Telephone Encounter (Signed)
Received a call from patient.She stated she needed a 30 day refill for Rythmol sent to [facility] pharmacy she has not received mail order refill yet.30 day refill sent to pharmacy.

## 2016-10-09 ENCOUNTER — Telehealth: Payer: Self-pay | Admitting: Gastroenterology

## 2016-10-09 NOTE — Telephone Encounter (Signed)
Patient had a very difficult bowel movement about 3 days ago. She was constipated. She started spotting blood with the bowel movement. She has had rectal bleeding with every visit to the toilet since then. She is on Eliquis.  She report she had some cramping on the first day. None since then. Okay to schedule banding? Patient asks to be allowed "1 more day to see if it stops".

## 2016-10-09 NOTE — Telephone Encounter (Signed)
Please advise patient to come in to check Hgb. Anusol suppository at bedtime. Benefiber TID with meals. Avoid excessive straining.  Ok to schedule visit for hemorrhoidal band ligation if continues to be symptomatic.

## 2016-10-09 NOTE — Telephone Encounter (Signed)
No answer. Patient is on Eliquis.

## 2016-10-10 ENCOUNTER — Other Ambulatory Visit: Payer: Self-pay

## 2016-10-10 DIAGNOSIS — K625 Hemorrhage of anus and rectum: Secondary | ICD-10-CM

## 2016-10-10 NOTE — Telephone Encounter (Signed)
Spoke with the patient. She reports she had some bleeding when she first got out of bed this morning. She has since had a bowel movement, but did not see any blood. She wants to wait for the CBC. Agrees to appointment 10/14/16. States she knows how to take her Eliquis and when to hold it prior to any banding. She will come to the OV prepared to have a Hemorrhoidal Banding if needed.

## 2016-10-14 ENCOUNTER — Other Ambulatory Visit (INDEPENDENT_AMBULATORY_CARE_PROVIDER_SITE_OTHER): Payer: Medicare Other

## 2016-10-14 ENCOUNTER — Encounter: Payer: Self-pay | Admitting: Gastroenterology

## 2016-10-14 ENCOUNTER — Ambulatory Visit (INDEPENDENT_AMBULATORY_CARE_PROVIDER_SITE_OTHER): Payer: Medicare Other | Admitting: Gastroenterology

## 2016-10-14 VITALS — BP 108/66 | HR 76 | Ht 63.09 in | Wt 148.2 lb

## 2016-10-14 DIAGNOSIS — K602 Anal fissure, unspecified: Secondary | ICD-10-CM

## 2016-10-14 DIAGNOSIS — K625 Hemorrhage of anus and rectum: Secondary | ICD-10-CM | POA: Diagnosis not present

## 2016-10-14 LAB — HEMOGLOBIN: HEMOGLOBIN: 11.8 g/dL — AB (ref 12.0–15.0)

## 2016-10-14 MED ORDER — AMBULATORY NON FORMULARY MEDICATION: 3 refills | Status: DC

## 2016-10-14 MED ORDER — LINACLOTIDE 72 MCG PO CAPS
72.0000 ug | ORAL_CAPSULE | Freq: Every day | ORAL | 3 refills | Status: DC
Start: 2016-10-14 — End: 2018-04-29

## 2016-10-14 NOTE — Patient Instructions (Signed)
We have printed out a prescription of Nitroglycerin ointment for you to take to your pharmacy to see if [facility] will compound, if they will not please take to [facility]  Go to the basement for labs today  We will send Linzess to your pharmacy  Use Benefiber 1 tablespoon three times a day with meals  Increase fluid intake to 8-10 cups per day

## 2016-10-14 NOTE — Progress Notes (Signed)
Jacqueline Kim    [id number]    [birth year]  Primary Care Physician:MACKENZIE,BRIAN, MD  Referring Physician: Thayer Headings, MD [address], [address] [place], [place] [address]  Chief complaint:  Blood per rectum, rectal pain   HPI:  71 year old female with history of irritable bowel syndrome, paroxysmal A. Fib status post pacemaker on chronic anticoagulation Eliquis here with complaints of bright red blood per rectum for past 1 week.  Patient has history of hemorrhage from internal hemorrhoids with blood per rectum, status post hemorrhoidal band ligation X3, last banding on 07/25/2016. She is having worsening constipation with 1-2 bowel movements a week and she was straining excessively last weekend and noticed a gush of blood after the bowel movement associated with rectal pain. Since then she has been having intermittent bright red blood per rectum. Bleeding has improved after she held Eliquis 2 days ago. She has pain with sitting and after bowel movement.  Outpatient Encounter Prescriptions as of 10/14/2016  Medication Sig  . apixaban (ELIQUIS) 5 MG TABS tablet Take 1 tablet (5 mg total) by mouth 2 (two) times daily.  Marland Kitchen atenolol (TENORMIN) 50 MG tablet TAKE 1 TABLET BY MOUTH EVERY MORNING. (GENERIC FOR TENORMIN)  . Biotin 5000 MCG TABS Take 1 tablet by mouth daily.  Marland Kitchen dicyclomine (BENTYL) 10 MG capsule TAKE ONE CAPSULE 3 TIMES DAILY BEFORE MEALS  . DILT-CD 120 MG 24 hr capsule TAKE 1 CAPSULE BY MOUTH DAILY AT BEDTIME  . docusate sodium (COLACE) 100 MG capsule Take 1 capsule (100 mg total) by mouth 2 (two) times daily.  Marland Kitchen escitalopram (LEXAPRO) 20 MG tablet Take 20 mg by mouth daily.   . furosemide (LASIX) 20 MG tablet Take 1 tablet (20 mg total) by mouth daily as needed for fluid.  Marland Kitchen HYDROcodone-acetaminophen (NORCO/VICODIN) 5-325 MG tablet Take 1.5 tablets by mouth every morning and 1 tablet lunch as needed for arthritis pain  . levothyroxine (SYNTHROID,  LEVOTHROID) 112 MCG tablet Take 112 mcg by mouth daily before breakfast.   . linaclotide (LINZESS) 72 MCG capsule Take 1 capsule (72 mcg total) by mouth daily before breakfast.  . losartan (COZAAR) 50 MG tablet Take 1 tablet (50 mg total) by mouth daily.  . propafenone (RYTHMOL) 150 MG tablet TAKE 1 AND 1/2 TABLETS BY MOUTH IN THE MORNING, TAKE 1 TABLET MIDDAY AND TAKE 1 AND 1/2 TABLETS IN THE EVENING (GENERIC FOR RYTHMOL)  . traZODone (DESYREL) 150 MG tablet Take 75 mg by mouth at bedtime.   . zaleplon (SONATA) 10 MG capsule Take 10 mg by mouth at bedtime.   . AMBULATORY NON FORMULARY MEDICATION Medication Name: Nitroglycerin ointment .0125% Pea size amount per rectum three times a day  . linaclotide (LINZESS) 72 MCG capsule Take 1 capsule (72 mcg total) by mouth daily before breakfast.   Facility-Administered Encounter Medications as of 10/14/2016  Medication  . 0.9 %  sodium chloride infusion    Allergies as of 10/14/2016 - Review Complete 10/14/2016  Allergen Reaction Noted  . Azithromycin Other (See Comments) 03/28/2014  . Epinephrine Other (See Comments) 08/30/2012  . Penicillins Anaphylaxis 05/27/2011  . Lidocaine Palpitations 07/13/2012  . Metoprolol Other (See Comments) 07/13/2012    Past Medical History:  Diagnosis Date  . Anticoagulant long-term use  WITH PRADAXA 05/27/2011  . Anxiety   . Arthritis   . Cellulitis of buttock, left 12/20/2014  . Complication of anesthesia    DIFFICULTY WAKING UP  . Depression   .  Diverticulosis   . DJD (degenerative joint disease)   . Esophagitis 09/17/06  . External hemorrhoids   . GERD (gastroesophageal reflux disease)   . Glaucoma   . History of stress test    a. 2008 - normal nuc.  Marland Kitchen Hypertension   . Hypothyroid   . IBS (irritable bowel syndrome)   . Paroxysmal A-fib [facility]) diagnosed 2008   a. On propafenone, Pradaxa.  . Presence of permanent cardiac pacemaker   . 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 BREAST REDUCTION SURGERY    . BUNIONECTOMY Right 07/09/2015   with rods and pins  . CARDIOVERSION N/A 08/02/2014   Procedure: CARDIOVERSION;  Surgeon: Quintella Reichert, MD;  Location: [facility] ENDOSCOPY;  Service: Cardiovascular;  Laterality: N/A;  . CATARACT EXTRACTION, BILATERAL Bilateral   . FACIAL COSMETIC SURGERY    . FOOT SURGERY Left   . HAND SURGERY     multiple  . LIPOSUCTION    . NM MYOCAR PERF WALL MOTION  12/29/2006   No significant ischemia; EF 69%  . OVARIAN CYST REMOVAL    . PACEMAKER INSERTION  04/08/11   MDT Revo implanted by Dr Rubie Maid  . TONSILLECTOMY    . TOTAL KNEE ARTHROPLASTY Left 05/01/2015   Procedure: TOTAL LEFT KNEE ARTHROPLASTY;  Surgeon: Durene Romans, MD;  Location: [facility] ORS;  Service: Orthopedics;  Laterality: Left;  Marland Kitchen VAGINAL HYSTERECTOMY      Family History  Problem Relation Age of Onset  . Anesthesia problems Mother   . Colon polyps Mother     beign  . Colon cancer Neg Hx     Social History   Social History  . Marital status: Legally Separated    Spouse name: N/A  . Number of children: 3  . Years of education: N/A   Occupational History  . retired Retired   Social History Main Topics  . Smoking status: Former Smoker    Quit date: 08/30/1996  . Smokeless tobacco: Never Used  . Alcohol use 3.6 oz/week    6 Glasses of wine per week     Comment: daily  . Drug use: No  . Sexual activity: Yes   Other Topics Concern  . Not on file   Social History Narrative   Lives in [place],  Former [employer],  Separated.        Review of systems: Review of Systems  Constitutional: Negative for fever and chills.  Eyes: Negative for blurred vision.  Respiratory: Negative for cough, shortness of breath and wheezing.   Cardiovascular: Negative for chest pain and palpitations.  Gastrointestinal: as per HPI Genitourinary: Negative for dysuria, urgency, frequency and  hematuria.  Musculoskeletal: Positive for myalgias, back pain and joint pain.  Skin: Negative for itching and rash.  Neurological: Negative for dizziness, tremors, focal weakness, seizures and loss of consciousness.  Endo/Heme/Allergies: Positive for seasonal allergies.  Psychiatric/Behavioral: Negative for depression, suicidal ideas and hallucinations.  All other systems reviewed and are negative.   Physical Exam: Vitals:   10/14/16 1400  BP: 108/66  Pulse: 76   Body mass index is 26.19 kg/m. Gen:      No acute distress HEENT:  EOMI, sclera anicteric Neck:     No masses; no thyromegaly Lungs:    Clear to auscultation bilaterally; normal respiratory effort CV:         Regular rate and rhythm; no murmurs Abd:      +  bowel sounds; soft, non-tender; no palpable masses, no distension Ext:    No edema; adequate peripheral perfusion Skin:      Warm and dry; no rash Neuro: alert and oriented x 3 Psych: normal mood and affect Rectal exam: Increased anal sphincter tone,1'oclock anal fissure, tenderness+, no external hemorrhoids  Data Reviewed:  Reviewed labs, radiology imaging, old records and pertinent past GI work up   Assessment and Plan/Recommendations:  71 year old female with history of proximal A. fib on chronic anticoagulation (Eliquis) here with complaints of blood per rectum associated with anorectal pain, rectal exam suggestive of anal fissure  Anal fissure with BRBPR and anal pain: Advised patient to apply rectal nitroglycerin 0.125% 3 times daily for 6-8 weeks Benefiber 1 tablespoon 3 times a day with meals Avoid excessive straining Check hemoglobin   Constipation: start linzess 72 g daily Increase fluid intake to 8-10 cups daily   Return in 4-6 weeks or sooner if needed    Iona Beard , MD [phone] Mon-Fri 8a-5p [phone] after 5p, weekends, holidays  CC: Thayer Headings, MD

## 2016-10-22 ENCOUNTER — Encounter: Payer: Self-pay | Admitting: Cardiovascular Disease

## 2016-10-22 ENCOUNTER — Ambulatory Visit (INDEPENDENT_AMBULATORY_CARE_PROVIDER_SITE_OTHER): Payer: Medicare Other | Admitting: Cardiovascular Disease

## 2016-10-22 VITALS — BP 137/83 | HR 69 | Ht 66.0 in | Wt 148.6 lb

## 2016-10-22 DIAGNOSIS — I10 Essential (primary) hypertension: Secondary | ICD-10-CM | POA: Diagnosis not present

## 2016-10-22 DIAGNOSIS — Z950 Presence of cardiac pacemaker: Secondary | ICD-10-CM | POA: Diagnosis not present

## 2016-10-22 DIAGNOSIS — Z7901 Long term (current) use of anticoagulants: Secondary | ICD-10-CM | POA: Diagnosis not present

## 2016-10-22 DIAGNOSIS — Z79899 Other long term (current) drug therapy: Secondary | ICD-10-CM

## 2016-10-22 DIAGNOSIS — E039 Hypothyroidism, unspecified: Secondary | ICD-10-CM

## 2016-10-22 DIAGNOSIS — I480 Paroxysmal atrial fibrillation: Secondary | ICD-10-CM

## 2016-10-22 NOTE — Progress Notes (Signed)
Patient ID: Jacqueline Kim, female   DOB: 1945-06-19, 71 y.o.   MRN: [id number]     HPI: Jacqueline Kim is a 71 y.o. female  who presents for a 21 month follow-up cardiologic evaluation.  Jacqueline Kim has known history of sinus node dysfunction. She has history of paroxysmal atrial fibrillation. She is status post permanent pacemaker implanted on 04/08/2011 Medtronic Revo MRI compatible pacemaker. She also has a history of hypothyroidism. She has had recurrent episodes of paroxysmal atrial fibrillation and has been on pradaxa anticoagulation for several years after initially having been on warfarin.  She has been maintained on rhythmol SR 325 mg twice a day and atenolol 50 mg daily.  She also is on losartan 50 mg for hypertension.  She was hospitalized in January 2014 with recurrent AF in the setting of significant increased family stress. She is now separated and living in a different household from her husband [name].  She has had several episodes of recurrent AF and had an episode on 03/12/2013 which lasted for approximately 12 hours.  On the Saturday before Easter 2015 when she was rushing with family members and trying to prepare dinner she was hospitalized and presented with a heart rate at 150.  She was started on IV Cardizem for rate control.  She spontaneously cardioverted to sinus rhythm on April 6 prior to a planned TEE cardioversion.  I saw her in followup of that hospitalization and suggested an EP evaluation.  She saw [name] for discussion concerning atrial fibrillation ablation.  Apparently, at that time, the patient was feeling well. She opted against pursuing AF ablation presently.    A followup echo Doppler study on 10/24/2013 showed an ejection fraction of 60-65% and there was evidence for grade 1 diastolic function without wall motion abnormalities.  Her left atrial dimension was normal.    She is followed by [name] for her pacemaker.  She was hospitalized in early  February 2016 following a prolonged episode of atrial fibrillation with rapid ventricular response for which she was symptomatic.  She underwent her initial cardioversion on 07/27/2014.  When she saw [name] on August 08, 2014 interrogation of her pacemaker showed a 28 hour episode of atrial fibrillation without recurrence since the cardioversion.  She again developed a recurrent episode of atrial fibrillation on 08/30/2014 which led to an emergency room evaluation.  I last saw her in March.  I recommended follow-up evaluation with [name] and reconsideration of possible atrial fibrillation ablation.  She saw [name] at that time admitted that she was drinking wine on a daily basis.  Rather than pursue ablation she elected to reduce her EtOH intake.  She feels that she has done well and has felt one episode of palpitations during her period of increased stress that she is aware of since her last evaluation..  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  She denies exertional chest pain, shortness of breath, orthopnea, PND, presyncope or syncope or any neurologic sequelae.  When she met with [name].  There was no discussion of changing her anti-rhythmic therapy.    She underwent left knee surgery by [name].  She also fell on December 20, 2014 in the shower and was hospitalized for 2 days.  She developed a cellulitis and buttock abscess, which required anabiotic therapy.    Since I last saw her, she states that she has felt well  from a cardiovascular standpoint.  She now lives at [facility] in a second floor unit.  She denies any episodes of chest pain.  At times she notes some shortness of breath with activity.  She last saw [name] in October 2017 and her pacemaker was functioning normally.  She did not have any recurrent atrial fibrillation and had only 2 brief episodes of PAT which combined last a total of 54 seconds.  She states she had  stopped taking the losartan, which had been 50 mg because that time, she noted some lightheadedness unassociated with any rhythm disturbance.  In August 2017, a CT of her abdomen was performed which did not explain some of her issues at the time of rectal bleeding and constipation.  She was found to have mild colonic diverticulosis without diverticulitis.  Incidentally, she was noted to have abdominal aortic atherosclerosis.  She presents for evaluation.  Past Medical History:  Diagnosis Date  . Anticoagulant long-term use  WITH PRADAXA 05/27/2011  . Anxiety   . Arthritis   . Cellulitis of buttock, left 12/20/2014  . Complication of anesthesia    DIFFICULTY WAKING UP  . Depression   . Diverticulosis   . DJD (degenerative joint disease)   . Esophagitis 09/17/06  . External hemorrhoids   . GERD (gastroesophageal reflux disease)   . Glaucoma   . History of stress test    a. 2008 - normal nuc.  Marland Kitchen Hypertension   . Hypothyroid   . IBS (irritable bowel syndrome)   . Paroxysmal A-fib [facility]) diagnosed 2008   a. On propafenone, Pradaxa.  . Presence of permanent cardiac pacemaker   . 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 BREAST REDUCTION SURGERY    . BUNIONECTOMY Right 07/09/2015   with rods and pins  . CARDIOVERSION N/A 08/02/2014   Procedure: CARDIOVERSION;  Surgeon: Sueanne Margarita, MD;  Location: [place];  Service: Cardiovascular;  Laterality: N/A;  . CATARACT EXTRACTION, BILATERAL Bilateral   . FACIAL COSMETIC SURGERY    . FOOT SURGERY Left   . HAND SURGERY     multiple  . LIPOSUCTION    . NM MYOCAR PERF WALL MOTION  12/29/2006   No significant ischemia; EF 69%  . OVARIAN CYST REMOVAL    . PACEMAKER INSERTION  04/08/11   MDT Revo implanted by Dr Recardo Evangelist  . TONSILLECTOMY    . TOTAL KNEE ARTHROPLASTY Left 05/01/2015   Procedure: TOTAL LEFT KNEE ARTHROPLASTY;  Surgeon: Paralee Cancel, MD;  Location: [facility];  Service: Orthopedics;  Laterality: Left;  Marland Kitchen VAGINAL HYSTERECTOMY      Allergies  Allergen Reactions  . Azithromycin Other (See Comments)    Causes heart rhythm issues  . Epinephrine Other (See Comments)    Causes A-Fib  . Penicillins Anaphylaxis    Has patient had a PCN reaction causing immediate rash, facial/tongue/throat swelling, SOB or lightheadedness with hypotension: Yes Has patient had a PCN reaction causing severe rash involving mucus membranes or skin necrosis: No Has patient had a PCN reaction that required hospitalization No Has patient had a PCN reaction occurring within the last 10 years: No If all of the above answers are "NO", then may proceed with Cephalosporin use.   . Lidocaine Palpitations    Or any similar medication  . Metoprolol Other (See Comments)    alopecia    Current Outpatient Prescriptions  Medication Sig  Dispense Refill  . AMBULATORY NON FORMULARY MEDICATION Medication Name: Nitroglycerin ointment .0125% Pea size amount per rectum three times a day 30 g 3  . apixaban (ELIQUIS) 5 MG TABS tablet Take 1 tablet (5 mg total) by mouth 2 (two) times daily. 60 tablet 12  . atenolol (TENORMIN) 50 MG tablet TAKE 1 TABLET BY MOUTH EVERY MORNING. (GENERIC FOR TENORMIN) 30 tablet 3  . Biotin 5000 MCG TABS Take 1 tablet by mouth daily.    Marland Kitchen dicyclomine (BENTYL) 10 MG capsule TAKE ONE CAPSULE 3 TIMES DAILY BEFORE MEALS 90 capsule 1  . DILT-CD 120 MG 24 hr capsule TAKE 1 CAPSULE BY MOUTH DAILY AT BEDTIME 90 capsule 0  . docusate sodium (COLACE) 100 MG capsule Take 1 capsule (100 mg total) by mouth 2 (two) times daily. 10 capsule 0  . escitalopram (LEXAPRO) 20 MG tablet Take 20 mg by mouth daily.     . furosemide (LASIX) 20 MG tablet Take 1 tablet (20 mg total) by mouth daily as needed for fluid. 30 tablet   . HYDROcodone-acetaminophen (NORCO/VICODIN) 5-325 MG tablet Take 1.5 tablets by mouth every morning and 1 tablet lunch as needed for  arthritis pain    . levothyroxine (SYNTHROID, LEVOTHROID) 112 MCG tablet Take 112 mcg by mouth daily before breakfast.     . linaclotide (LINZESS) 72 MCG capsule Take 1 capsule (72 mcg total) by mouth daily before breakfast. 30 capsule 3  . linaclotide (LINZESS) 72 MCG capsule Take 1 capsule (72 mcg total) by mouth daily before breakfast. 30 capsule 3  . losartan (COZAAR) 50 MG tablet Take 1 tablet (50 mg total) by mouth daily. 90 tablet 1  . metroNIDAZOLE (METROGEL) 1 % gel Apply topically.    . propafenone (RYTHMOL) 150 MG tablet TAKE 1 AND 1/2 TABLETS BY MOUTH IN THE MORNING, TAKE 1 TABLET MIDDAY AND TAKE 1 AND 1/2 TABLETS IN THE EVENING (GENERIC FOR RYTHMOL) 120 tablet 1  . traZODone (DESYREL) 150 MG tablet Take 75 mg by mouth at bedtime.     . triamcinolone cream (KENALOG) 0.1 % Apply to affected areas on face twice daily    . zaleplon (SONATA) 10 MG capsule Take 10 mg by mouth at bedtime.      Current Facility-Administered Medications  Medication Dose Route Frequency Provider Last Rate Last Dose  . 0.9 %  sodium chloride infusion  500 mL Intravenous Continuous Nandigam, Venia Minks, MD        Social History   Social History  . Marital status: Legally Separated    Spouse name: N/A  . Number of children: 3  . Years of education: N/A   Occupational History  . retired Retired   Social History Main Topics  . Smoking status: Former Smoker    Quit date: 08/30/1996  . Smokeless tobacco: Never Used  . Alcohol use 3.6 oz/week    6 Glasses of wine per week     Comment: daily  . Drug use: No  . Sexual activity: Yes   Other Topics Concern  . Not on file   Social History Narrative   Lives in [place],  Former [employer],  Separated.      Family History  Problem Relation Age of Onset  . Anesthesia problems Mother   . Colon polyps Mother        beign  . Colon cancer Neg Hx    ROS General: Negative; No fevers, chills, or night sweats;  HEENT: Negative; No changes  in vision or hearing, sinus congestion, difficulty swallowing Pulmonary: Negative; No cough, wheezing, shortness of breath, hemoptysis Cardiovascular:  See HPI GI: Positive for irritable bowel syndrome for which she takes Bentyl before meals;  GU: Negative; No dysuria, hematuria, or difficulty voiding Musculoskeletal: Negative; no myalgias, joint pain, or weakness Hematologic/Oncology: Negative; no easy bruising, bleeding Endocrine: Negative; no heat/cold intolerance; no diabetes Neuro: Negative; no changes in balance, headaches Skin: Negative; No rashes or skin lesions Psychiatric: There is less stress and anxiety; No behavioral problems, depression Sleep: Negative; No snoring, daytime sleepiness, hypersomnolence, bruxism, restless legs, hypnogognic hallucinations, no cataplexy Other comprehensive 14 point system review is negative.   PE BP 137/83   Pulse 69   Ht _0  (1.676 m)   Wt 148 lb 9.6 oz (67.4 kg)   BMI 23.98 kg/m    Repeat blood pressure by me was 142/84. Wt Readings from Last 3 Encounters:  10/22/16 148 lb 9.6 oz (67.4 kg)  10/14/16 148 lb 4 oz (67.2 kg)  06/25/16 148 lb (67.1 kg)   General: Alert, oriented, no distress.  Skin: normal turgor, no rashes HEENT: Normocephalic, atraumatic. Pupils round and reactive; sclera anicteric;no lid lag.  Nose without nasal septal hypertrophy Mouth/Parynx benign; Mallinpatti scale 2 Neck: No JVD, no carotid bruits with normal carotid upstroke. Lungs: clear to ausculatation and percussion; no wheezing or rales Chest wall: Nontender to palpation Heart: PMI is not displaced RRR, s1 s2 normal 1/6 systolic murmur; no S3 or S4 gallop.  No rubs, thrills or heaves. Abdomen: soft, nontender; no hepatosplenomehaly, BS+; abdominal aorta nontender and not dilated by palpation. Back: No CVA tenderness Pulses 2+ Extremities: no clubbing cyanosis or edema, Homan's sign negative  Neurologic: grossly nonfocal Psychologic: normal affect and  mood.  ECG (independently read by me): Atrially paced rhythm at 69 bpm.  Prolonged AV conduction with PR interval 266 milliseconds.  Increased QTc interval 497 ms.  August 2016 ECG (independently read by me): Atrially paced rhythm at 66 bpm.  PR interval 232 ms.  QTc interval normal at 448 ms.  ECG (independently read by me): Atrially paced rhythm at 64 bpm.  No AF.  QTC 435 ms.  December 2015 ECG (independently read by me): Atrially paced rhythm at 65 with prolonged AV conduction with a PR interval at 270 ms.  No significant ST segment changes.   June 2015 ECG (independently read by me): Atrially paced rhythm at 62 beats per minute with prolonged AV conduction with a PR interval of 244 ms.  QTc interval 444 ms.  Prior 09/22/2013 ECG (independently read by me): Atrial paced rhythm at 63 beats per minute with ventricular sensing.   LABS:  BMP Latest Ref Rng & Units 01/22/2016 05/04/2015 05/03/2015  Glucose 65 - 99 mg/dL 92 97 114(H)  BUN 6 - 20 mg/dL 16 19 23(H)  Creatinine 0.44 - 1.00 mg/dL 1.13(H) 0.97 1.22(H)  Sodium 135 - 145 mmol/L 134(L) 132(L) 128(L)  Potassium 3.5 - 5.1 mmol/L 4.1 4.9 4.3  Chloride 101 - 111 mmol/L 100(L) 101 97(L)  CO2 22 - 32 mmol/L _1 Calcium 8.9 - 10.3 mg/dL 9.7 8.7(L) 8.7(L)    Hepatic Function Latest Ref Rng & Units 01/22/2016 12/21/2014 12/20/2014  Total Protein 6.5 - 8.1 g/dL 6.9 4.8(L) 6.0(L)  Albumin 3.5 - 5.0 g/dL 4.0 2.3(L) 3.0(L)  AST 15 - 41 U/L _2 ALT 14 - 54 U/L 14 13(L) 14  Alk Phosphatase 38 - 126 U/L 40 37(L) 47  Total Bilirubin 0.3 - 1.2 mg/dL 0.6 0.4 0.5  Bilirubin, Direct 0.0 - 0.3 mg/dL - - -    CBC Latest Ref Rng & Units 10/14/2016 01/22/2016 05/03/2015  WBC 4.0 - 10.5 K/uL - 5.2 7.1  Hemoglobin 12.0 - 15.0 g/dL 11.8(L) 11.8(L) 9.0(L)  Hematocrit 36.0 - 46.0 % - 36.6 26.3(L)  Platelets 150 - 400 K/uL - 239 221   Lab Results  Component Value Date   MCV 96.6 01/22/2016   MCV 92.9 05/03/2015   MCV 93.3 05/02/2015    Lab  Results  Component Value Date   TSH 6.630 (H) 08/30/2014   Lab Results  Component Value Date   HGBA1C 5.3 04/01/2011    BNP    Component Value Date/Time   PROBNP 1,110.0 (H) 09/18/2013 0332    Lipid Panel     Component Value Date/Time   CHOL  12/19/2006 0315    172        ATP III CLASSIFICATION:  <200     mg/dL   Desirable  200-239  mg/dL   Borderline High  >=240    mg/dL   High   TRIG 293 (H) 12/19/2006 0315   HDL 37 (L) 12/19/2006 0315   CHOLHDL 4.6 12/19/2006 0315   VLDL 59 (H) 12/19/2006 0315   LDLCALC  12/19/2006 0315    76        Total Cholesterol/HDL:CHD Risk Coronary Heart Disease Risk Table                     Men   Women  1/2 Average Risk   3.4   3.3     RADIOLOGY: No results found.  IMPRESSION:is   1. Essential hypertension   2. Paroxysmal atrial fibrillation (HCC)   3. Medication management   4. Hypothyroidism, unspecified type   5. Anticoagulant long-term use  WITH PRADAXA, CHADS Vasc score 3   6. Pacemaker     ASSESSMENT AND PLAN: Jacqueline Kim Is a 71 year old Caucasian female who has a history of PAF and is status post permanent pacemaker for sinus node dysfunction. She is anticoagulated with her Pradaxa and has a Chads2vasc score of at least 3. Her echo Doppler study on 10/24/2013 showed normal systolic function with an ejection fraction of 60-65%.  There was grade 1 diastolic dysfunction.  There were no regional wall motion abnormalities.  The left atrium was normal in size.  In the past, her episodes of atrial fibrillation have seen to occur during periods of increased stress but she has experienced several episodes of recurrent AF which do not appear to be stress mediated.   She has seen [name] for consideration of ablation with her last evaluation in April 2016.  She has opted against ablation and has continued with medical therapy.  Her atrial fibrillation has been very well controlled presently on atenolol 50 mg daily and  diltiazem CD 120 mg at bedtime.  She continues to be on propofenone 150 mg tablet and has been taking 1-1/2 pills twice a day.  Her last interrogation of her pacemaker was excellent with no recurrent AF.  Her QTC today was mildly prolonged at 497 and follow-up will be necessary.  Her blood pressure is elevated today and I discussed with her new hypertensive guidelines.  I have suggested that she try reinstituting losartan at least a 25 mg in the dependent upon her blood pressure response.  If dose increase is necessary that she take this as a  twice a day drug rather than 50 g all at once.  I have recommended that she see [name] next month in the office for pacemaker interrogation and reassessment of her ECG and QT interval.  I'm also scheduling her for follow-up echo Doppler study, which has not been done since 2015.  I will see her in September 2018 for follow up evaluation.  Time spent: 25 minutes Troy Sine, MD, [facility]  10/24/2016 12:02 PM

## 2016-10-22 NOTE — Patient Instructions (Signed)
Medication Instructions:   Restart the losartan @ 25 mg daily. Monitor your blood pressure. If your systolic pressure (top number) stays in the 130-140 range increase it to 25 mg twice a day.  Labwork:  Prior to your September appointment with Dr [name]. Lab slips will be mailed to you.  Testing/Procedures:  Your physician has requested that you have an echocardiogram. Echocardiography is a painless test that uses sound waves to create images of your heart. It provides your doctor with information about the size and shape of your heart and how well your heart's chambers and valves are working. This procedure takes approximately one hour. There are no restrictions for this procedure.  THIS WILL BE DONE IN SEPTEMBER  Follow-Up:  September with Dr [name]  Any Other Special Instructions Will Be Listed Below (If Applicable).

## 2016-10-30 ENCOUNTER — Other Ambulatory Visit: Payer: Self-pay | Admitting: *Deleted

## 2016-10-30 MED ORDER — ATENOLOL 50 MG PO TABS: ORAL_TABLET | ORAL | 3 refills | Status: DC

## 2016-11-04 ENCOUNTER — Other Ambulatory Visit (HOSPITAL_COMMUNITY): Payer: Medicare Other

## 2016-11-05 ENCOUNTER — Encounter: Payer: Medicare Other | Admitting: Gastroenterology

## 2016-11-13 ENCOUNTER — Encounter: Payer: Self-pay | Admitting: Cardiovascular Disease

## 2016-11-13 ENCOUNTER — Ambulatory Visit (HOSPITAL_COMMUNITY): Payer: Medicare Other | Attending: Internal Medicine

## 2016-11-13 ENCOUNTER — Other Ambulatory Visit: Payer: Self-pay

## 2016-11-13 ENCOUNTER — Ambulatory Visit (INDEPENDENT_AMBULATORY_CARE_PROVIDER_SITE_OTHER): Payer: Medicare Other | Admitting: Cardiovascular Disease

## 2016-11-13 VITALS — BP 93/64 | HR 63 | Ht 65.0 in | Wt 146.0 lb

## 2016-11-13 DIAGNOSIS — Z950 Presence of cardiac pacemaker: Secondary | ICD-10-CM | POA: Diagnosis not present

## 2016-11-13 DIAGNOSIS — I10 Essential (primary) hypertension: Secondary | ICD-10-CM | POA: Diagnosis not present

## 2016-11-13 DIAGNOSIS — I952 Hypotension due to drugs: Secondary | ICD-10-CM

## 2016-11-13 DIAGNOSIS — I081 Rheumatic disorders of both mitral and tricuspid valves: Secondary | ICD-10-CM | POA: Diagnosis not present

## 2016-11-13 DIAGNOSIS — I495 Sick sinus syndrome: Secondary | ICD-10-CM | POA: Diagnosis not present

## 2016-11-13 DIAGNOSIS — I480 Paroxysmal atrial fibrillation: Secondary | ICD-10-CM

## 2016-11-13 DIAGNOSIS — Z7901 Long term (current) use of anticoagulants: Secondary | ICD-10-CM | POA: Diagnosis not present

## 2016-11-13 LAB — ECHOCARDIOGRAM COMPLETE
HEIGHTINCHES: 65 in
Weight: 2336 oz

## 2016-11-13 MED ORDER — LOSARTAN POTASSIUM 25 MG PO TABS: 25.0000 mg | ORAL_TABLET | Freq: Every day | ORAL | 3 refills | Status: DC

## 2016-11-13 NOTE — Patient Instructions (Signed)
Dr Royann Shivers has recommended making the following medication changes: 1. DECREASE Losartan to 25 mg daily  Your physician recommends that you schedule a follow-up appointment in 6 months with a pacemaker check. You will receive a reminder letter in the mail two months in advance. If you don't receive a letter, please call our office to schedule the follow-up appointment.  If you need a refill on your cardiac medications before your next appointment, please call your pharmacy.

## 2016-11-13 NOTE — Progress Notes (Addendum)
Cardiology Office Note    Date:  11/13/2016   ID:  Jacqueline Kim, DOB 1945/07/31, MRN [id number]  PCP:  Thayer Headings, MD  Cardiologist:  Nicki Guadalajara, MD; Thurmon Fair, MD   Chief Complaint  Patient presents with  . Shortness of Breath    pt states some SOB and dizziness     History of Present Illness:  Jacqueline Kim is a 71 y.o. female with paroxysmal atrial fibrillation and tachy-brady sd., here for pacemaker check.  She feels Well and has not had palpitations. Propafenone has offered excellent control. No palpitations, bleeding, neuro events, dyspnea or angina has occurred.  She stopped her losartan because her blood pressure was low but then returned with a borderline elevated blood pressure when she saw Dr. [name] earlier this month. He restarted losartan 25 mg once daily with advice to increase the dose of her blood pressure was still high. She is now taking losartan 50 mg and is hypotensive 93/64. She feels often tired. She had a fall on her carpeted floor at home, because of dizziness, and has a minor bruise on her right cheek bone.  Medtronic Revo (2012) device is functioning normally. The current burden of atrial fibrillation is zero. She has not even had any of the atrial tachycardia in the 6 months since her last check..  She has 88% atrial pacing due to sinus bradycardia, but no V pacing. Battery voltage is 2.99V (RRT 2.81V). Lead parameters remain excellent. Heart rate histograms are little blunted. Activity level is usually around 3 hours a day, just decreased over the last week to 1.5 hours a day.  Past Medical History:  Diagnosis Date  . Anticoagulant long-term use  WITH PRADAXA 05/27/2011  . Anxiety   . Arthritis   . Cellulitis of buttock, left 12/20/2014  . Complication of anesthesia    DIFFICULTY WAKING UP  . Depression   . Diverticulosis   . DJD (degenerative joint disease)   . Esophagitis 09/17/06  . External hemorrhoids   . GERD (gastroesophageal  reflux disease)   . Glaucoma   . History of stress test    a. 2008 - normal nuc.  Marland Kitchen Hypertension   . Hypothyroid   . IBS (irritable bowel syndrome)   . Paroxysmal A-fib [facility]) diagnosed 2008   a. On propafenone, Pradaxa.  . Presence of permanent cardiac pacemaker   . 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 BREAST REDUCTION SURGERY    . BUNIONECTOMY Right 07/09/2015   with rods and pins  . CARDIOVERSION N/A 08/02/2014   Procedure: CARDIOVERSION;  Surgeon: Quintella Reichert, MD;  [facility];  Service: Cardiovascular;  Laterality: N/A;  . CATARACT EXTRACTION, BILATERAL Bilateral   . FACIAL COSMETIC SURGERY    . FOOT SURGERY Left   . HAND SURGERY     multiple  . LIPOSUCTION    . NM MYOCAR PERF WALL MOTION  12/29/2006   No significant ischemia; EF 69%  . OVARIAN CYST REMOVAL    . PACEMAKER INSERTION  04/08/11   MDT Revo implanted by Dr Rubie Maid  . TONSILLECTOMY    . TOTAL KNEE ARTHROPLASTY Left 05/01/2015   Procedure: TOTAL LEFT KNEE ARTHROPLASTY;  Surgeon: Durene Romans, MD;  Location: [facility];  Service: Orthopedics;  Laterality: Left;  Marland Kitchen VAGINAL HYSTERECTOMY      Current Medications: Outpatient Medications Prior to Visit  Medication Sig Dispense  Refill  . AMBULATORY NON FORMULARY MEDICATION Medication Name: Nitroglycerin ointment .0125% Pea size amount per rectum three times a day 30 g 3  . apixaban (ELIQUIS) 5 MG TABS tablet Take 1 tablet (5 mg total) by mouth 2 (two) times daily. 60 tablet 12  . atenolol (TENORMIN) 50 MG tablet TAKE 1 TABLET BY MOUTH EVERY MORNING. (GENERIC FOR TENORMIN) 90 tablet 3  . Biotin 5000 MCG TABS Take 1 tablet by mouth daily.    Marland Kitchen dicyclomine (BENTYL) 10 MG capsule TAKE ONE CAPSULE 3 TIMES DAILY BEFORE MEALS 90 capsule 1  . DILT-CD 120 MG 24 hr capsule TAKE 1 CAPSULE BY MOUTH DAILY AT BEDTIME 90 capsule 0  . docusate sodium (COLACE) 100 MG capsule  Take 1 capsule (100 mg total) by mouth 2 (two) times daily. 10 capsule 0  . escitalopram (LEXAPRO) 20 MG tablet Take 20 mg by mouth daily.     . furosemide (LASIX) 20 MG tablet Take 1 tablet (20 mg total) by mouth daily as needed for fluid. 30 tablet   . HYDROcodone-acetaminophen (NORCO/VICODIN) 5-325 MG tablet Take 1.5 tablets by mouth every morning and 1 tablet lunch as needed for arthritis pain    . levothyroxine (SYNTHROID, LEVOTHROID) 112 MCG tablet Take 112 mcg by mouth daily before breakfast.     . linaclotide (LINZESS) 72 MCG capsule Take 1 capsule (72 mcg total) by mouth daily before breakfast. 30 capsule 3  . linaclotide (LINZESS) 72 MCG capsule Take 1 capsule (72 mcg total) by mouth daily before breakfast. 30 capsule 3  . metroNIDAZOLE (METROGEL) 1 % gel Apply topically.    . propafenone (RYTHMOL) 150 MG tablet TAKE 1 AND 1/2 TABLETS BY MOUTH IN THE MORNING, TAKE 1 TABLET MIDDAY AND TAKE 1 AND 1/2 TABLETS IN THE EVENING (GENERIC FOR RYTHMOL) 120 tablet 1  . traZODone (DESYREL) 150 MG tablet Take 75 mg by mouth at bedtime.     . triamcinolone cream (KENALOG) 0.1 % Apply to affected areas on face twice daily    . zaleplon (SONATA) 10 MG capsule Take 10 mg by mouth at bedtime.     Marland Kitchen losartan (COZAAR) 50 MG tablet Take 1 tablet (50 mg total) by mouth daily. 90 tablet 1   Facility-Administered Medications Prior to Visit  Medication Dose Route Frequency Provider Last Rate Last Dose  . 0.9 %  sodium chloride infusion  500 mL Intravenous Continuous Nandigam, Kavitha V, MD         Allergies:   Azithromycin; Epinephrine; Penicillins; Lidocaine; and Metoprolol   Social History   Social History  . Marital status: Legally Separated    Spouse name: N/A  . Number of children: 3  . Years of education: N/A   Occupational History  . retired Retired   Social History Main Topics  . Smoking status: Former Smoker    Quit date: 08/30/1996  . Smokeless tobacco: Never Used  . Alcohol use 3.6  oz/week    6 Glasses of wine per week     Comment: daily  . Drug use: No  . Sexual activity: Yes   Other Topics Concern  . None   Social History Narrative   Lives in [place],  Former [employer],  Separated.       Family History:  The patient's family history includes Anesthesia problems in her mother; Colon polyps in her mother.   ROS:   Please see the history of present illness.    ROS All other systems reviewed  and are negative.   PHYSICAL EXAM:   VS:  BP 93/64   Pulse 63   Ht 5\' 5"  (1.651 m)   Wt 146 lb (66.2 kg)   SpO2 93%   BMI 24.30 kg/m    GEN: Well nourished, well developed, in no acute distress  HEENT: normal  Neck: no JVD, carotid bruits, or masses Cardiac: RRR; no murmurs, rubs, or gallops,no edema , healthy left subclavian pacemaker site Respiratory:  clear to auscultation bilaterally, normal work of breathing GI: soft, nontender, nondistended, + BS MS: no deformity or atrophy  Skin: warm and dry, no rash Neuro:  Alert and Oriented x 3, Strength and sensation are intact Psych: euthymic mood, full affect  Wt Readings from Last 3 Encounters:  11/13/16 146 lb (66.2 kg)  10/22/16 148 lb 9.6 oz (67.4 kg)  10/14/16 148 lb 4 oz (67.2 kg)      Studies/Labs Reviewed:   EKG:  EKG is ordered today.  It shows atrial paced, ventricular sensed rhythm with prolonged AV delay of 280 ms. Delayed anterior wall R-wave progression. Borderline QTC 456 ms, shorter than when she saw Dr. 12/14/16. Recent Labs: 01/22/2016: ALT 14; BUN 16; Creatinine, Ser 1.13; Platelets 239; Potassium 4.1; Sodium 134 10/14/2016: Hemoglobin 11.8   Lipid Panel    Component Value Date/Time   CHOL  12/19/2006 0315    172        ATP III CLASSIFICATION:  <200     mg/dL   Desirable  02/19/2007  mg/dL   Borderline High  656-812    mg/dL   High   TRIG >=751 (H) 700 0315   HDL 37 (L) 12/19/2006 0315   CHOLHDL 4.6 12/19/2006 0315   VLDL 59 (H) 12/19/2006 0315   LDLCALC  12/19/2006  0315    76        Total Cholesterol/HDL:CHD Risk Coronary Heart Disease Risk Table                     Men   Women  1/2 Average Risk   3.4   3.3    Additional studies/ records that were reviewed today include:  Notes from Dr. 02/19/2007 Oct 22, 2016  Labs performed in [facility] medical on May 13 show a normal potassium and a slightly increased creatinine1.3  ASSESSMENT:    1. Paroxysmal atrial fibrillation (HCC)   2. Chronic anticoagulation   3. Sinus node dysfunction (HCC)   4. Pacemaker   5. Hypotension due to drugs   6. Essential hypertension      PLAN:  In order of problems listed above:  1. AFib: Low burden,none in > 1.5 years, rare PAT with 1:1 conduction (even that has not been seen in 6 months or more). CHADSVasc at least 3, on anticoagulation. Mildly prolonged QT due to propafenone. 2. Eliquis: Asked her to call May 15 back if she has any more falls or any injuries or bleeding. 3. SSS:  heart rate histogramIs a little blunted, but she does not describe clear-cut exertional fatigue or dyspnea. No changes made 4. PPM:  Refers not to perform remote downloads. Will do office checks every 6 months. 5. HTN: She is hypotensive on the current medications. Reportedly her blood pressure was too high when she stopped losartan completely. Will try to split the difference and keep her on losartan 25 mg once daily. Labs performed on May 13 were drawn shortly after she had taken furosemide and she was probably little hypovolemic.  Medication Adjustments/Labs  and Tests Ordered: Current medicines are reviewed at length with the patient today.  Concerns regarding medicines are outlined above.  Medication changes, Labs and Tests ordered today are listed in the Patient Instructions below. Patient Instructions  Dr Royann Shivers has recommended making the following medication changes: 1. DECREASE Losartan to 25 mg daily  Your physician recommends that you schedule a follow-up appointment in 6 months with  a pacemaker check. You will receive a reminder letter in the mail two months in advance. If you don't receive a letter, please call our office to schedule the follow-up appointment.  If you need a refill on your cardiac medications before your next appointment, please call your pharmacy.    Signed, Thurmon Fair, MD  11/13/2016 10:11 AM    [facility] [address] [place], [place], [place]  [address] Phone: [phone]; Fax: [phone]

## 2016-11-18 NOTE — Addendum Note (Signed)
Addended by: Chana Bode on: 11/18/2016 09:59 AM   Modules accepted: Orders

## 2016-11-25 ENCOUNTER — Encounter (HOSPITAL_COMMUNITY): Payer: Self-pay | Admitting: Nurse Practitioner

## 2016-11-25 ENCOUNTER — Emergency Department (HOSPITAL_COMMUNITY)
Admission: EM | Admit: 2016-11-25 | Discharge: 2016-11-25 | Disposition: A | Discharge status: Home / Self Care | Payer: Medicare Other | Attending: Emergency Medicine | Admitting: Emergency Medicine

## 2016-11-25 ENCOUNTER — Emergency Department (HOSPITAL_COMMUNITY): Payer: Medicare Other

## 2016-11-25 DIAGNOSIS — Z79899 Other long term (current) drug therapy: Secondary | ICD-10-CM | POA: Insufficient documentation

## 2016-11-25 DIAGNOSIS — Z7901 Long term (current) use of anticoagulants: Secondary | ICD-10-CM | POA: Diagnosis not present

## 2016-11-25 DIAGNOSIS — N3000 Acute cystitis without hematuria: Secondary | ICD-10-CM | POA: Diagnosis not present

## 2016-11-25 DIAGNOSIS — Z87891 Personal history of nicotine dependence: Secondary | ICD-10-CM | POA: Insufficient documentation

## 2016-11-25 DIAGNOSIS — Z96652 Presence of left artificial knee joint: Secondary | ICD-10-CM | POA: Diagnosis not present

## 2016-11-25 DIAGNOSIS — R309 Painful micturition, unspecified: Secondary | ICD-10-CM | POA: Diagnosis present

## 2016-11-25 DIAGNOSIS — Z950 Presence of cardiac pacemaker: Secondary | ICD-10-CM | POA: Insufficient documentation

## 2016-11-25 DIAGNOSIS — I10 Essential (primary) hypertension: Secondary | ICD-10-CM | POA: Insufficient documentation

## 2016-11-25 DIAGNOSIS — E039 Hypothyroidism, unspecified: Secondary | ICD-10-CM | POA: Diagnosis not present

## 2016-11-25 LAB — URINALYSIS, ROUTINE W REFLEX MICROSCOPIC
Bilirubin Urine: NEGATIVE
GLUCOSE, UA: NEGATIVE mg/dL
Hgb urine dipstick: NEGATIVE
KETONES UR: NEGATIVE mg/dL
Leukocytes, UA: NEGATIVE
NITRITE: NEGATIVE
PROTEIN: 30 mg/dL — AB
Specific Gravity, Urine: 1.02 (ref 1.005–1.030)
pH: 5 (ref 5.0–8.0)

## 2016-11-25 LAB — BASIC METABOLIC PANEL
ANION GAP: 7 (ref 5–15)
BUN: 18 mg/dL (ref 6–20)
CHLORIDE: 100 mmol/L — AB (ref 101–111)
CO2: 24 mmol/L (ref 22–32)
Calcium: 9.8 mg/dL (ref 8.9–10.3)
Creatinine, Ser: 1.08 mg/dL — ABNORMAL HIGH (ref 0.44–1.00)
GFR calc Af Amer: 58 mL/min — ABNORMAL LOW (ref >60)
GFR calc non Af Amer: 50 mL/min — ABNORMAL LOW (ref >60)
GLUCOSE: 97 mg/dL (ref 65–99)
POTASSIUM: 4.2 mmol/L (ref 3.5–5.1)
Sodium: 131 mmol/L — ABNORMAL LOW (ref 135–145)

## 2016-11-25 LAB — CBC
HEMATOCRIT: 34.8 % — AB (ref 36.0–46.0)
HEMOGLOBIN: 11.3 g/dL — AB (ref 12.0–15.0)
MCH: 30.7 pg (ref 26.0–34.0)
MCHC: 32.5 g/dL (ref 30.0–36.0)
MCV: 94.6 fL (ref 78.0–100.0)
Platelets: 220 10*3/uL (ref 150–400)
RBC: 3.68 MIL/uL — ABNORMAL LOW (ref 3.87–5.11)
RDW: 12.7 % (ref 11.5–15.5)
WBC: 9.4 10*3/uL (ref 4.0–10.5)

## 2016-11-25 MED ORDER — CIPROFLOXACIN HCL 250 MG PO TABS: 250.0000 mg | ORAL_TABLET | Freq: Two times a day (BID) | ORAL | 0 refills | Status: DC

## 2016-11-25 MED ORDER — CIPROFLOXACIN HCL 500 MG PO TABS
500.0000 mg | ORAL_TABLET | Freq: Once | ORAL | Status: AC
  Administered 2016-11-25: 500 mg via ORAL
  Filled 2016-11-25: qty 1

## 2016-11-25 MED ORDER — HYDROCODONE-ACETAMINOPHEN 5-325 MG PO TABS
1.0000 | ORAL_TABLET | ORAL | Status: AC
  Administered 2016-11-25: 1 via ORAL
  Filled 2016-11-25: qty 1

## 2016-11-25 NOTE — ED Provider Notes (Signed)
[facility]PT Provider Note   CSN: [id number] Arrival date & time: 11/25/16  1533     History   Chief Complaint Chief Complaint  Patient presents with  . Weakness    HPI Jacqueline Kim is a 71 y.o. female.  HPI   This morning she also woke up with aching in her back.  SHe felt warm but did not have a fever.  SHe has been nauseated and vomited once.  She also has some mild discomfort with urination.    No abdominal pain.  No chest pain.  No cough.  No shortness of breath  She has also had intermittent palpitations.  She was worried she might be in a fib again.  She felt like she was in it earlier but that is better. Past Medical History:  Diagnosis Date  . Anticoagulant long-term use  WITH PRADAXA 05/27/2011  . Anxiety   . Cellulitis of buttock, left 12/20/2014  . Complication of anesthesia    DIFFICULTY WAKING UP  . Depression   . Diverticulosis   . DJD (degenerative joint disease)   . Esophagitis 09/17/06  . External hemorrhoids   . GERD (gastroesophageal reflux disease)   . Glaucoma   . History of stress test    a. 2008 - normal nuc.  Marland Kitchen Hypertension   . Hypothyroid   . IBS (irritable bowel syndrome)   . Paroxysmal A-fib [facility]) diagnosed 2008   a. On propafenone, Pradaxa.  . 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 Rectal bleeding 02/01/2016  . Constipation 02/01/2016  . Dehydration 05/05/2015  . S/P left TKA 05/01/2015  . S/P knee replacement 05/01/2015  . Hypotension due to drugs 02/20/2015  . Cellulitis of left buttock 12/20/2014  . Cellulitis 12/20/2014  . AKI (acute kidney injury) (HCC) 12/20/2014  . Hyponatremia 12/20/2014  . Paroxysmal atrial fibrillation (HCC) 08/01/2014  . Irritable bowel syndrome 06/14/2014  . Fatigue 03/06/2014  . HTN (hypertension) 09/28/2013  . Atrial fibrillation  with rapid ventricular response; paroxysmal 09/18/2013  . Chest pain 10/08/2012  . D-dimer, elevated, pt. on pradaxa 09/11/2011  . Chest pain, negative MI, resolved once heart rate slowed, secondary to rapid a. fib 09/11/2011  . Paroxysmal a-fib, recurrent 07/13/12- (on Propafenone)  05/27/2011  . Sinus node dysfunction (HCC) 05/27/2011  . Hypothyroid 05/27/2011  . 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 Anxiety 05/27/2011  . Chronic anticoagulation 05/27/2011    Past Surgical History:  Procedure Laterality Date  . BREAST REDUCTION SURGERY    . BUNIONECTOMY Right 07/09/2015   with rods and pins  . CARDIOVERSION N/A 08/02/2014   Procedure: CARDIOVERSION;  Surgeon: Quintella Reichert, MD;  Location: [facility]OS[facility];  Service: Cardiovascular;  Laterality: N/A;  . CATARACT EXTRACTION, BILATERAL Bilateral   . FACIAL COSMETIC SURGERY    . FOOT SURGERY Left   . HAND SURGERY     multiple  . LIPOSUCTION    . NM MYOCAR PERF WALL MOTION  12/29/2006   No significant ischemia; EF 69%  . OVARIAN CYST REMOVAL    . PACEMAKER INSERTION  04/08/11   MDT Revo implanted by Dr Rubie Maid  . TONSILLECTOMY    . TOTAL KNEE ARTHROPLASTY Left 05/01/2015   Procedure: TOTAL LEFT KNEE ARTHROPLASTY;  Surgeon: Durene Romans, MD;  Location: [facility] ORS;  Service: Orthopedics;  Laterality: Left;  Marland Kitchen VAGINAL HYSTERECTOMY      OB History    No data available       Home Medications    Prior to Admission medications   Medication Sig Start Date End Date Taking? Authorizing Provider  AMBULATORY NON FORMULARY MEDICATION Medication Name: Nitroglycerin ointment .0125% Pea size amount per rectum three times a day 10/14/16   Napoleon Form, MD  apixaban (ELIQUIS) 5 MG TABS tablet Take 1 tablet (5 mg total) by mouth 2 (two) times daily. 03/28/16   [name], MD  atenolol (TENORMIN) 50 MG tablet TAKE 1 TABLET BY MOUTH EVERY MORNING. [facility] FOR TENORMIN) 10/30/16   [name], MD  Biotin 5000 MCG TABS  Take 1 tablet by mouth daily.    [provider]  ciprofloxacin (CIPRO) 250 MG tablet Take 1 tablet (250 mg total) by mouth every 12 (twelve) hours. 11/25/16   Linwood Dibbles, MD  dicyclomine (BENTYL) 10 MG capsule TAKE ONE CAPSULE 3 TIMES DAILY BEFORE MEALS 03/02/15   Pyrtle, Carie Caddy, MD  DILT-CD 120 MG 24 hr capsule TAKE 1 CAPSULE BY MOUTH DAILY AT BEDTIME 09/18/16   [name], MD  docusate sodium (COLACE) 100 MG capsule Take 1 capsule (100 mg total) by mouth 2 (two) times daily. 05/04/15   Lanney Gins, PA-C  escitalopram (LEXAPRO) 20 MG tablet Take 20 mg by mouth daily.     [provider]  furosemide (LASIX) 20 MG tablet Take 1 tablet (20 mg total) by mouth daily as needed for fluid. 12/25/14   Marinda Elk, MD  HYDROcodone-acetaminophen (NORCO/VICODIN) 5-325 MG tablet Take 1.5 tablets by mouth every morning and 1 tablet lunch as needed for arthritis pain    [provider]  levothyroxine (SYNTHROID, LEVOTHROID) 112 MCG tablet Take 112 mcg by mouth daily before breakfast.     [provider]  linaclotide (LINZESS) 72 MCG capsule Take 1 capsule (72 mcg total) by mouth daily before breakfast. 03/11/16   Napoleon Form, MD  linaclotide (LINZESS) 72 MCG capsule Take 1 capsule (72 mcg total) by mouth daily before breakfast. 10/14/16   Nandigam, Eleonore Chiquito, MD  losartan (COZAAR) 25 MG tablet Take 1 tablet (25 mg total) by mouth daily. 11/13/16   Croitoru, Mihai, MD  metroNIDAZOLE (METROGEL) 1 % gel Apply topically. 10/08/16   [provider]  propafenone (RYTHMOL) 150 MG tablet TAKE 1 AND 1/2 TABLETS BY MOUTH IN THE MORNING, TAKE 1 TABLET MIDDAY AND TAKE 1 AND 1/2 TABLETS IN THE EVENING (GENERIC FOR RYTHMOL) 09/26/16   Croitoru, Mihai, MD  traZODone (DESYREL) 150 MG tablet Take 75 mg by mouth at bedtime.     [provider]  triamcinolone cream (KENALOG) 0.1 % Apply to affected areas on face twice daily 10/01/16   [provider]    zaleplon (SONATA) 10 MG capsule Take 10 mg by mouth at bedtime.     [provider]    Family History Family History  Problem Relation Age of Onset  . Anesthesia problems Mother   . Colon polyps Mother        beign  . Colon cancer Neg Hx     Social History Social History  Substance Use Topics  . Smoking status: Former Smoker    Quit date: 08/30/1996  . Smokeless tobacco: Never Used  . Alcohol use 3.6 oz/week    6 Glasses of wine per week     Comment: daily  Allergies   Azithromycin; Epinephrine; Penicillins; Lidocaine; and Metoprolol   Review of Systems Review of Systems  All other systems reviewed and are negative.    Physical Exam Updated Vital Signs BP (!) 130/59   Pulse 62   Temp 98.6 F (37 C) (Oral)   Resp 15   Ht 1.664 m (5' 5.5")   Wt 66.7 kg (147 lb)   SpO2 94%   BMI 24.09 kg/m   Physical Exam  Constitutional: She appears well-developed and well-nourished. No distress.  HENT:  Head: Normocephalic and atraumatic.  Right Ear: External ear normal.  Left Ear: External ear normal.  Eyes: Conjunctivae are normal. Right eye exhibits no discharge. Left eye exhibits no discharge. No scleral icterus.  Neck: Neck supple. No tracheal deviation present.  Cardiovascular: Normal rate, regular rhythm and intact distal pulses.   Pulmonary/Chest: Effort normal and breath sounds normal. No stridor. No respiratory distress. She has no wheezes. She has no rales.  Abdominal: Soft. Bowel sounds are normal. She exhibits no distension. There is no tenderness. There is no rebound and no guarding.  Musculoskeletal: She exhibits no edema or tenderness.  Neurological: She is alert. She has normal strength. No cranial nerve deficit (no facial droop, extraocular movements intact, no slurred speech) or sensory deficit. She exhibits normal muscle tone. She displays no seizure activity. Coordination normal.  Skin: Skin is warm and dry. No rash noted.  Psychiatric: She  has a normal mood and affect.  Nursing note and vitals reviewed.    ED Treatments / Results  Labs (all labs ordered are listed, but only abnormal results are displayed) Labs Reviewed  BASIC METABOLIC PANEL - Abnormal; Notable for the following:       Result Value   Sodium 131 (*)    Chloride 100 (*)    Creatinine, Ser 1.08 (*)    GFR calc non Af Amer 50 (*)    GFR calc Af Amer 58 (*)    All other components within normal limits  CBC - Abnormal; Notable for the following:    RBC 3.68 (*)    Hemoglobin 11.3 (*)    HCT 34.8 (*)    All other components within normal limits  URINALYSIS, ROUTINE W REFLEX MICROSCOPIC - Abnormal; Notable for the following:    APPearance HAZY (*)    Protein, ur 30 (*)    Bacteria, UA MANY (*)    Squamous Epithelial / LPF 0-5 (*)    All other components within normal limits  URINE CULTURE    EKG  EKG Interpretation  Date/Time:  Tuesday November 25 2016 15:41:15 EDT Ventricular Rate:  61 PR Interval:  240 QRS Duration: 92 QT Interval:  432 QTC Calculation: 434 R Axis:   6 Text Interpretation:  Atrial-paced rhythm with prolonged AV conduction Low voltage QRS Cannot rule out Anterior infarct , age undetermined Abnormal ECG No significant change since last tracing Confirmed by Linwood Dibbles [phone]) on 11/25/2016 5:00:08 PM       Radiology Dg Chest 2 View  Result Date: 11/25/2016 CLINICAL DATA:  71 year old female with fever, back soreness, neck soreness and atrial fibrillation. EXAM: CHEST  2 VIEW COMPARISON:  Chest x-ray 12/20/2014. FINDINGS: Lung volumes are normal. No consolidative airspace disease. No pleural effusions. No pneumothorax. No pulmonary nodule or mass noted. Pulmonary vasculature and the cardiomediastinal silhouette are within normal limits. Atherosclerosis in the thoracic aorta. Left-sided pacemaker device in place with lead tips projecting over the expected location of the right atrium and  right ventricular apex. IMPRESSION: 1. No  radiographic evidence of acute cardiopulmonary disease. 2. Aortic atherosclerosis. Electronically Signed   By: Trudie Reed M.D.   On: 11/25/2016 18:37    Procedures Procedures (including critical care time)  Medications Ordered in ED Medications  ciprofloxacin (CIPRO) tablet 500 mg (not administered)  HYDROcodone-acetaminophen (NORCO/VICODIN) 5-325 MG per tablet 1 tablet (1 tablet Oral Given 11/25/16 1733)     Initial Impression / Assessment and Plan / ED Course  I have reviewed the triage vital signs and the nursing notes.  Pertinent labs & imaging results that were available during my care of the patient were reviewed by me and considered in my medical decision making (see chart for details).   patient presented to the emergency room with complaints of palpitations as well as bodyaches and subjective fevers.  EKG is reassuring. Her blood tests are unremarkable. Urinalysis does suggest urinary tract infection.  We'll discharge home on Cipro considering her allergies.. Monitor for fever or worsening symptoms. Follow up with her primary care doctor.  Final Clinical Impressions(s) / ED Diagnoses   Final diagnoses:  Acute cystitis without hematuria    New Prescriptions New Prescriptions   CIPROFLOXACIN (CIPRO) 250 MG TABLET    Take 1 tablet (250 mg total) by mouth every 12 (twelve) hours.     Linwood Dibbles, MD 11/25/16 2055

## 2016-11-25 NOTE — ED Notes (Signed)
Pt given fluids to drink so she is able to urinate.

## 2016-11-25 NOTE — Discharge Instructions (Signed)
Follow-up with your doctor next week to make sure urinary infection resolves, monitor for fever, vomiting, worsening symptoms

## 2016-11-25 NOTE — ED Notes (Signed)
Pt aware that urine specimen needed.  Urine cup and wipes given to patient.

## 2016-11-25 NOTE — ED Triage Notes (Signed)
Patient states feels she is back in atrial fibrillation because she feels her heart racing and then slowing down and then skipping a beat since 1:30pm today. Patient endorses generalized weakness, body aches and subjective fevers starting last night. Endorses decreased appetite, nausea earlier and one episode of vomiting.  Patient denies pain, cough, urinary symptoms, abdominal pain, diarrhea.

## 2016-11-25 NOTE — ED Notes (Signed)
Patient transported to X-ray 

## 2016-11-27 ENCOUNTER — Telehealth: Payer: Self-pay | Admitting: Cardiovascular Disease

## 2016-11-27 NOTE — Telephone Encounter (Signed)
Pt says she have been taking Cipro since Tuesday,she is really wearied about taking Cipro with her other medicine. She wants to know what else can she take in the place of Cipro?Hse needs to know this asap please,she wants to take it this evening.

## 2016-11-27 NOTE — Telephone Encounter (Signed)
Spoke with patient regarding her cipro,. I told her that I would s/w pharmacy and call her back. I spoke with Raquel, she said she would call the patient.

## 2016-11-27 NOTE — Telephone Encounter (Signed)
Talked to patient. She is allergic to penicillins with severe anaphylaxis reported. Fluoroquinolones are not best choice either due to drug-drug interaction with propafenone and escitalopram.  Patient took ciprofloxacin as prescribed for 3 days but not comfortable taking for total 14 days therapy.   Will recommend Bactrim DS twice daily for 7 days. Recommendation called in to her PCP.

## 2016-11-28 ENCOUNTER — Telehealth: Payer: Self-pay | Admitting: Cardiovascular Disease

## 2016-11-28 LAB — URINE CULTURE

## 2016-11-28 NOTE — Telephone Encounter (Signed)
Close encounter 

## 2016-11-29 ENCOUNTER — Telehealth: Payer: Self-pay

## 2016-11-29 NOTE — Telephone Encounter (Signed)
Post ED Visit - Positive Culture Follow-up  Culture report reviewed by antimicrobial stewardship pharmacist:  []  , Pharm.D. []  Enzo Bi, Pharm.D., BCPS AQ-ID [x]  , Pharm.D., BCPS []  Celedonio Miyamoto, .D., BCPS []  [place], .D., BCPS, AAHIVP []  Georgina Pillion, Pharm.D., BCPS, AAHIVP []  [address], PharmD, BCPS []  , PharmD, BCPS []  [place], PharmD, BCPS  Positive urine culture Treated with Ciprofloxacin, organism sensitive to the same and no further patient follow-up is required at this time.  [address] 11/29/2016, 11:45 AM

## 2016-12-04 ENCOUNTER — Ambulatory Visit: Payer: Medicare Other | Admitting: Gastroenterology

## 2016-12-05 ENCOUNTER — Other Ambulatory Visit: Payer: Self-pay | Admitting: Cardiovascular Disease

## 2016-12-29 ENCOUNTER — Encounter (HOSPITAL_COMMUNITY): Payer: Self-pay | Admitting: *Deleted

## 2016-12-29 ENCOUNTER — Ambulatory Visit (HOSPITAL_COMMUNITY)
Admission: EM | Admit: 2016-12-29 | Discharge: 2016-12-29 | Disposition: A | Discharge status: Home / Self Care | Payer: Medicare Other | Attending: Internal Medicine | Admitting: Internal Medicine

## 2016-12-29 DIAGNOSIS — L089 Local infection of the skin and subcutaneous tissue, unspecified: Secondary | ICD-10-CM | POA: Diagnosis not present

## 2016-12-29 DIAGNOSIS — L600 Ingrowing nail: Secondary | ICD-10-CM | POA: Diagnosis not present

## 2016-12-29 MED ORDER — HYDROCODONE-ACETAMINOPHEN 5-325 MG PO TABS: ORAL_TABLET | ORAL | 0 refills | Status: DC

## 2016-12-29 MED ORDER — LIDOCAINE HCL 2 % IJ SOLN
INTRAMUSCULAR | Status: AC
  Filled 2016-12-29: qty 20

## 2016-12-29 MED ORDER — CLINDAMYCIN HCL 300 MG PO CAPS: 300.0000 mg | ORAL_CAPSULE | Freq: Three times a day (TID) | ORAL | 0 refills | Status: DC

## 2016-12-29 NOTE — Discharge Instructions (Signed)
The finger was infected likely due to ingrown fingernail. No pus was seen today. None was expressed. A small portion of the ingrown nail was completely removed from the soft tissue. This should no longer be a source of infection and pain. It should grow back normally within a few days to weeks. Leave the dressing on for 24 hours then remove it and wash the area with mild soap and water under running water. He may then apply Neosporin ointment and a bandage. After about 3 days the bandage can be relatively then and possibly a Band-Aid will work well. Take the medication as needed. If you develop diarrhea with the antibiotic stopped taking it and let us know.

## 2016-12-29 NOTE — ED Triage Notes (Signed)
Pt  Has   Redness   And   Swelling  And  Pus      X   2   Weeks   Was   Draining  Slightly     Up  Until   4   Days   Has   Had   Similar     Problems     In past

## 2016-12-29 NOTE — ED Triage Notes (Signed)
Finger involved  Is  The    l  Middle  Finger

## 2016-12-29 NOTE — ED Provider Notes (Signed)
CSN: [id number]     Arrival date & time 12/29/16  1505 History   First MD Initiated Contact with Patient 12/29/16 [id number]     Chief Complaint  Patient presents with  . Hand Pain   (Consider location/radiation/quality/duration/timing/severity/associated sxs/prior Treatment) 71 year old pleasant female presents to the urgent care with complaints of redness, pain, tenderness along the edge of the nail of the left long finger. She states there has been intermittent purulence expressed. She went to a nail salon and they apparently tried to remove an ingrown portion of the nail but it seemed to have caused more of an infection. There is no obvious fluctuance representing a paronychia.      Past Medical History:  Diagnosis Date  . Anticoagulant long-term use  WITH PRADAXA 05/27/2011  . Anxiety   . Cellulitis of buttock, left 12/20/2014  . Complication of anesthesia    DIFFICULTY WAKING UP  . Depression   . Diverticulosis   . DJD (degenerative joint disease)   . Esophagitis 09/17/06  . External hemorrhoids   . GERD (gastroesophageal reflux disease)   . Glaucoma   . History of stress test    a. 2008 - normal nuc.  Marland Kitchen Hypertension   . Hypothyroid   . IBS (irritable bowel syndrome)   . Paroxysmal A-fib [facility]) diagnosed 2008   a. On propafenone, Pradaxa.  . 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 BREAST REDUCTION SURGERY    . BUNIONECTOMY Right 07/09/2015   with rods and pins  . CARDIOVERSION N/A 08/02/2014   Procedure: CARDIOVERSION;  Surgeon: Quintella Reichert, MD;  Location: [facility] ENDOSCOPY;  Service: Cardiovascular;  Laterality: N/A;  . CATARACT EXTRACTION, BILATERAL Bilateral   . FACIAL COSMETIC SURGERY    . FOOT SURGERY Left   . HAND SURGERY     multiple  . LIPOSUCTION    . NM MYOCAR PERF WALL MOTION  12/29/2006   No significant  ischemia; EF 69%  . OVARIAN CYST REMOVAL    . PACEMAKER INSERTION  04/08/11   MDT Revo implanted by Dr Rubie Maid  . TONSILLECTOMY    . TOTAL KNEE ARTHROPLASTY Left 05/01/2015   Procedure: TOTAL LEFT KNEE ARTHROPLASTY;  Surgeon: Durene Romans, MD;  Location: [facility] ORS;  Service: Orthopedics;  Laterality: Left;  Marland Kitchen VAGINAL HYSTERECTOMY     Family History  Problem Relation Age of Onset  . Anesthesia problems Mother   . Colon polyps Mother        beign  . Colon cancer Neg Hx    Social History  Substance Use Topics  . Smoking status: Former Smoker    Quit date: 08/30/1996  . Smokeless tobacco: Never Used  . Alcohol use 3.6 oz/week    6 Glasses of wine per week     Comment: daily   OB History    No data available     Review of Systems  Constitutional: Negative.   Skin:       As per history of present illness. Complaining of pain and infection to the left middle finger around the nail.  All other systems reviewed and are negative.   Allergies  Azithromycin; Epinephrine; Penicillins; Lidocaine; and Metoprolol  Home Medications   Prior to Admission medications   Medication Sig Start Date End Date Taking? Authorizing Provider  AMBULATORY NON FORMULARY MEDICATION Medication Name: Nitroglycerin ointment .  0125% Pea size amount per rectum three times a day 10/14/16   Napoleon Form, MD  apixaban (ELIQUIS) 5 MG TABS tablet Take 1 tablet (5 mg total) by mouth 2 (two) times daily. 03/28/16   [name], MD  atenolol (TENORMIN) 50 MG tablet TAKE 1 TABLET BY MOUTH EVERY MORNING. [facility] FOR TENORMIN) 10/30/16   [name], MD  Biotin 5000 MCG TABS Take 1 tablet by mouth daily.    [provider]  ciprofloxacin (CIPRO) 250 MG tablet Take 1 tablet (250 mg total) by mouth every 12 (twelve) hours. 11/25/16   Linwood Dibbles, MD  dicyclomine (BENTYL) 10 MG capsule TAKE ONE CAPSULE 3 TIMES DAILY BEFORE MEALS 03/02/15   Pyrtle, Carie Caddy, MD  diltiazem (DILT-CD) 120 MG 24 hr capsule Take 1  capsule (120 mg total) by mouth at bedtime. 12/05/16   Croitoru, Mihai, MD  docusate sodium (COLACE) 100 MG capsule Take 1 capsule (100 mg total) by mouth 2 (two) times daily. 05/04/15   Lanney Gins, PA-C  escitalopram (LEXAPRO) 20 MG tablet Take 20 mg by mouth daily.     [provider]  furosemide (LASIX) 20 MG tablet Take 1 tablet (20 mg total) by mouth daily as needed for fluid. 12/25/14   Marinda Elk, MD  HYDROcodone-acetaminophen (NORCO/VICODIN) 5-325 MG tablet Take 1.5 tablets by mouth every morning and 1 tablet lunch as needed for arthritis pain    [provider]  levothyroxine (SYNTHROID, LEVOTHROID) 112 MCG tablet Take 112 mcg by mouth daily before breakfast.     [provider]  linaclotide (LINZESS) 72 MCG capsule Take 1 capsule (72 mcg total) by mouth daily before breakfast. 03/11/16   Napoleon Form, MD  linaclotide (LINZESS) 72 MCG capsule Take 1 capsule (72 mcg total) by mouth daily before breakfast. 10/14/16   Nandigam, Eleonore Chiquito, MD  losartan (COZAAR) 25 MG tablet Take 1 tablet (25 mg total) by mouth daily. 11/13/16   Croitoru, Mihai, MD  metroNIDAZOLE (METROGEL) 1 % gel Apply topically. 10/08/16   [provider]  propafenone (RYTHMOL) 150 MG tablet TAKE 1 AND 1/2 TABLETS BY MOUTH IN THE MORNING, TAKE 1 TABLET MIDDAY AND TAKE 1 AND 1/2 TABLETS IN THE EVENING (GENERIC FOR RYTHMOL) 09/26/16   Croitoru, Mihai, MD  traZODone (DESYREL) 150 MG tablet Take 75 mg by mouth at bedtime.     [provider]  triamcinolone cream (KENALOG) 0.1 % Apply to affected areas on face twice daily 10/01/16   [provider]  zaleplon (SONATA) 10 MG capsule Take 10 mg by mouth at bedtime.     [provider]   Meds Ordered and Administered this Visit  Medications - No data to display  BP 111/66 (BP Location: Right Arm)   Pulse 62   Temp 98.3 F (36.8 C) (Oral)   Resp 18   SpO2 99%  No data found.   Physical Exam   Constitutional: She is oriented to person, place, and time. She appears well-developed and well-nourished.  Neck: Neck supple.  Cardiovascular: Normal rate.   Pulmonary/Chest: Effort normal.  Musculoskeletal: Normal range of motion. She exhibits tenderness.  Neurological: She is alert and oriented to person, place, and time.  Skin: Skin is warm.  Patient does have rheumatoid arthritis and degenerative joint of the digits. The soft tissue around the left middle finger nail is erythematous with minor swelling. No fluctuance or obvious paronychia. At as. That the nail may be embedded into the  adjacent soft tissue on the ulnar side.  Psychiatric: She has a normal mood and affect.  Nursing note and vitals reviewed.   Urgent Care Course     .Nail Removal Date/Time: 12/29/2016 4:59 PM Performed by: Phineas Real, Marshelle Bilger Authorized by: Eustace Moore   Consent:    Consent obtained:  Verbal   Consent given by:  Patient   Risks discussed:  Bleeding, infection and pain Location:    Hand:  L long finger Pre-procedure details:    Skin preparation:  Betadine   Preparation: Patient was prepped and draped in the usual sterile fashion   Anesthesia (see MAR for exact dosages):    Anesthesia method:  Local infiltration   Local anesthetic:  Lidocaine 2% w/o epi Nail Removal:    Nail removed:  Partial   Nail side:  Radial   Nail bed repaired: no   Ingrown nail:    Nail matrix removed or ablated:  None Nails trimmed:    Number of nails trimmed:  1 Post-procedure details:    Dressing:  Antibiotic ointment and gauze roll   Patient tolerance of procedure:  Tolerated well, no immediate complications Comments:     Digital block performed with 2% Xylocaine. Complete anesthesia obtained. The radial side of the nail was undermined with blunt instrumentation and approximately 3 mm of nail was cut straight longitudinally and then pulled away from the soft tissue and base of the nail with straight forceps.  Minimal bleeding. Dressing applied.   (including critical care time)  Labs Review Labs Reviewed - No data to display  Imaging Review No results found.   Visual Acuity Review  Right Eye Distance:   Left Eye Distance:   Bilateral Distance:    Right Eye Near:   Left Eye Near:    Bilateral Near:         MDM   1. Ingrown nail of left middle finger   2. Finger infection    The finger was infected likely due to ingrown fingernail. No pus was seen today. None was expressed. A small portion of the ingrown nail was completely removed from the soft tissue. This should no longer be a source of infection and pain. It should grow back normally within a few days to weeks. Leave the dressing on for 24 hours then remove it and wash the area with mild soap and water under running water. He may then apply Neosporin ointment and a bandage. After about 3 days the bandage can be relatively then and possibly a Band-Aid will work well. Take the medication as needed. If you develop diarrhea with the antibiotic stopped taking it and let us know. Meds ordered this encounter  Medications  . clindamycin (CLEOCIN) 300 MG capsule    Sig: Take 1 capsule (300 mg total) by mouth 3 (three) times daily.    Dispense:  21 capsule    Refill:  0    Order Specific Question:   Supervising Provider    Answer:   Mardella Layman [id number]  . HYDROcodone-acetaminophen (NORCO/VICODIN) 5-325 MG tablet    Sig: Take 1/2 to 1 tablet as needed for pain    Dispense:  15 tablet    Refill:  0    Order Specific Question:   Supervising Provider    Answer:   Mardella Layman [[id number]]       Hayden Rasmussen, NP 12/29/16 1712

## 2017-02-10 ENCOUNTER — Ambulatory Visit: Payer: Medicare Other | Admitting: Gastroenterology

## 2017-03-04 ENCOUNTER — Ambulatory Visit (INDEPENDENT_AMBULATORY_CARE_PROVIDER_SITE_OTHER): Payer: Medicare Other | Admitting: Cardiovascular Disease

## 2017-03-04 ENCOUNTER — Encounter: Payer: Self-pay | Admitting: Cardiovascular Disease

## 2017-03-04 VITALS — BP 100/56 | HR 66 | Ht 66.0 in | Wt 146.0 lb

## 2017-03-04 DIAGNOSIS — I495 Sick sinus syndrome: Secondary | ICD-10-CM

## 2017-03-04 DIAGNOSIS — Z950 Presence of cardiac pacemaker: Secondary | ICD-10-CM | POA: Diagnosis not present

## 2017-03-04 DIAGNOSIS — Z7901 Long term (current) use of anticoagulants: Secondary | ICD-10-CM | POA: Diagnosis not present

## 2017-03-04 DIAGNOSIS — I480 Paroxysmal atrial fibrillation: Secondary | ICD-10-CM | POA: Diagnosis not present

## 2017-03-04 DIAGNOSIS — I10 Essential (primary) hypertension: Secondary | ICD-10-CM | POA: Diagnosis not present

## 2017-03-04 MED ORDER — ATENOLOL 25 MG PO TABS: 25.0000 mg | ORAL_TABLET | Freq: Two times a day (BID) | ORAL | 3 refills | Status: DC

## 2017-03-04 NOTE — Patient Instructions (Signed)
Medication Instructions:  Take 25mg  Atenolol (1/2 tablet) in the AM and 25mg  (1/2 tablet) in the PM.  If BP consistently running low (90's or below)-STOP Losartan  Follow-Up: Your physician wants you to follow-up in: 6 MONTHS with Dr. .  You will receive a reminder letter in the mail two months in advance. If you don't receive a letter, please call our office to schedule the follow-up appointment.   Any Other Special Instructions Will Be Listed Below (If Applicable).     If you need a refill on your cardiac medications before your next appointment, please call your pharmacy.

## 2017-03-04 NOTE — Progress Notes (Signed)
Patient ID: Jacqueline Kim, female   DOB: 03-Mar-1946, 71 y.o.   MRN: [id number]     HPI: Jacqueline Kim is a 71 y.o. female  who presents for a 4 month follow-up cardiologic evaluation.  [name] has known history of sinus node dysfunction. She has history of paroxysmal atrial fibrillation. She is status post permanent pacemaker implanted on 04/08/2011 Medtronic Revo MRI compatible pacemaker. She also has a history of hypothyroidism. She has had recurrent episodes of paroxysmal atrial fibrillation and has been on pradaxa anticoagulation for several years after initially having been on warfarin.  She has been maintained on rhythmol SR 325 mg twice a day and atenolol 50 mg daily.  She also is on losartan 50 mg for hypertension.  She was hospitalized in January 2014 with recurrent AF in the setting of significant increased family stress. She is now separated and living in a different household from her husband [name].  She has had several episodes of recurrent AF and had an episode on 03/12/2013 which lasted for approximately 12 hours.  On the Saturday before Easter 2015 when she was rushing with family members and trying to prepare dinner she was hospitalized and presented with a heart rate at 150.  She was started on IV Cardizem for rate control.  She spontaneously cardioverted to sinus rhythm on April 6 prior to a planned TEE cardioversion.  I saw her in followup of that hospitalization and suggested an EP evaluation.  She saw [name] for discussion concerning atrial fibrillation ablation.  Apparently, at that time, the patient was feeling well. She opted against pursuing AF ablation presently.    A followup echo Doppler study on 10/24/2013 showed an ejection fraction of 60-65% and there was evidence for grade 1 diastolic function without wall motion abnormalities.  Her left atrial dimension was normal.    She is followed by [name] for her pacemaker.  She was hospitalized in early  February 2016 following a prolonged episode of atrial fibrillation with rapid ventricular response for which she was symptomatic.  She underwent her initial cardioversion on 07/27/2014.  When she saw [name] on August 08, 2014 interrogation of her pacemaker showed a 28 hour episode of atrial fibrillation without recurrence since the cardioversion.  She again developed a recurrent episode of atrial fibrillation on 08/30/2014 which led to an emergency room evaluation.  I last saw her in March.  I recommended follow-up evaluation with [name] and reconsideration of possible atrial fibrillation ablation.  She saw [name] at that time admitted that she was drinking wine on a daily basis.  Rather than pursue ablation she elected to reduce her EtOH intake.  She feels that she has done well and has felt one episode of palpitations during her period of increased stress that she is aware of since her last evaluation..  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  She denies exertional chest pain, shortness of breath, orthopnea, PND, presyncope or syncope or any neurologic sequelae.  When she met with [name].  There was no discussion of changing her anti-rhythmic therapy.    She underwent left knee surgery by [name].  She also fell on December 20, 2014 in the shower and was hospitalized for 2 days.  She developed a cellulitis and buttock abscess, which required anabiotic therapy.     She now lives at [facility] in a second floor unit.  She denies any episodes of chest pain.  At times she notes some shortness of breath with activity.  She last saw [name] in October 2017 and her pacemaker was functioning normally.  She did not have any recurrent atrial fibrillation and had only 2 brief episodes of PAT which combined last a total of 54 seconds.  She states she had stopped taking the losartan, which had been 50 mg because that time, she noted some  lightheadedness unassociated with any rhythm disturbance.  In August 2017, a CT of her abdomen was performed which did not explain some of her issues at the time of rectal bleeding and constipation.  She was found to have mild colonic diverticulosis without diverticulitis.  Incidentally, she was noted to have abdominal aortic atherosclerosis.    When I last saw her, she was hypertensive and I recommended reinstitution of losartan at least 25 mg depending upon her blood pressure, but if necessary to increase to 25 mg 3 times a day.  Since I last saw her in May 2018, she underwent an echo Doppler study which showed an EF of 55-60%.  She had normal diastolic parameters.  There was mild mitral regurgitation, mild left atrial dilation, and mild pulmonary hypertension with a PA peak pressure 38 mm.  She saw [name] on 11/13/2016 and her losartan dose was reduced to 25 mg daily.  She is now seen Dr. Naida Sleight for rheumatoid arthritis.  She is status post left knee replacement and tolerated this well.  Her primary physician is [name].  She denies any awareness of recurrent atrial fibrillation.  She denies chest pressure.  She denies shortness of breath.  She presents for evaluation.  Past Medical History:  Diagnosis Date  . Anticoagulant long-term use  WITH PRADAXA 05/27/2011  . Anxiety   . Cellulitis of buttock, left 12/20/2014  . Complication of anesthesia    DIFFICULTY WAKING UP  . Depression   . Diverticulosis   . DJD (degenerative joint disease)   . Esophagitis 09/17/06  . External hemorrhoids   . GERD (gastroesophageal reflux disease)   . Glaucoma   . History of stress test    a. 2008 - normal nuc.  Marland Kitchen Hypertension   . Hypothyroid   . IBS (irritable bowel syndrome)   . Paroxysmal A-fib [facility]) diagnosed 2008   a. On propafenone, Pradaxa.  . 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 BREAST REDUCTION SURGERY    . BUNIONECTOMY Right 07/09/2015   with rods and pins  . CARDIOVERSION N/A 08/02/2014   Procedure: CARDIOVERSION;  Surgeon: Sueanne Margarita, MD;  Location: [place];  Service: Cardiovascular;  Laterality: N/A;  . CATARACT EXTRACTION, BILATERAL Bilateral   . FACIAL COSMETIC SURGERY    . FOOT SURGERY Left   . HAND SURGERY     multiple  . LIPOSUCTION    . NM MYOCAR PERF WALL MOTION  12/29/2006   No significant ischemia; EF 69%  . OVARIAN CYST REMOVAL    . PACEMAKER INSERTION  04/08/11   MDT Revo implanted by Dr Recardo Evangelist  . TONSILLECTOMY    . TOTAL KNEE ARTHROPLASTY Left 05/01/2015   Procedure: TOTAL LEFT KNEE ARTHROPLASTY;  Surgeon: Paralee Cancel, MD;  Location: [facility];  Service: Orthopedics;  Laterality: Left;  Marland Kitchen VAGINAL HYSTERECTOMY      Allergies  Allergen Reactions  . Azithromycin Other (See Comments)    Causes heart rhythm issues  . Epinephrine Other (See Comments)    Causes A-Fib  . Penicillins Anaphylaxis    Has patient had a PCN reaction causing immediate rash, facial/tongue/throat swelling, SOB or lightheadedness with hypotension: Yes Has patient had a PCN reaction causing severe rash involving mucus membranes or skin necrosis: No Has patient had a PCN reaction that required hospitalization No Has patient had a PCN reaction occurring within the last 10 years: No If all of the above answers are "NO", then may proceed with Cephalosporin use.   . Lidocaine Palpitations    Or any similar medication  . Metoprolol Other (See Comments)    alopecia    Current Outpatient Prescriptions  Medication Sig Dispense Refill  . AMBULATORY NON FORMULARY MEDICATION Medication Name: Nitroglycerin ointment .0125% Pea size amount per rectum three times a day 30 g 3  . apixaban (ELIQUIS) 5 MG TABS tablet Take 1 tablet (5 mg total) by mouth 2 (two) times daily. 60 tablet 12  . atenolol (TENORMIN) 25 MG  tablet Take 1 tablet (25 mg total) by mouth 2 (two) times daily. 180 tablet 3  . Biotin 5000 MCG TABS Take 1 tablet by mouth daily.    . clindamycin (CLEOCIN) 300 MG capsule Take 1 capsule (300 mg total) by mouth 3 (three) times daily. 21 capsule 0  . dicyclomine (BENTYL) 10 MG capsule TAKE ONE CAPSULE 3 TIMES DAILY BEFORE MEALS 90 capsule 1  . diltiazem (DILT-CD) 120 MG 24 hr capsule Take 1 capsule (120 mg total) by mouth at bedtime. 90 capsule 3  . docusate sodium (COLACE) 100 MG capsule Take 1 capsule (100 mg total) by mouth 2 (two) times daily. 10 capsule 0  . escitalopram (LEXAPRO) 20 MG tablet Take 20 mg by mouth daily.     . furosemide (LASIX) 20 MG tablet Take 1 tablet (20 mg total) by mouth daily as needed for fluid. 30 tablet   . HYDROcodone-acetaminophen (NORCO/VICODIN) 5-325 MG tablet Take 1/2 to 1 tablet as needed for pain 15 tablet 0  . levothyroxine (SYNTHROID, LEVOTHROID) 112 MCG tablet Take 112 mcg by mouth daily before breakfast.     . linaclotide (LINZESS) 72 MCG capsule Take 1 capsule (72 mcg total) by mouth daily before breakfast. 30 capsule 3  . linaclotide (LINZESS) 72 MCG capsule Take 1 capsule (72 mcg total) by mouth daily before breakfast. 30 capsule 3  . losartan (COZAAR) 25 MG tablet Take 1 tablet (25 mg total) by mouth daily. 90 tablet 3  . metroNIDAZOLE (METROGEL) 1 % gel Apply topically.    . propafenone (RYTHMOL) 150 MG tablet TAKE 1 AND 1/2 TABLETS BY MOUTH IN THE MORNING, TAKE 1 TABLET MIDDAY AND TAKE 1 AND 1/2 TABLETS IN THE EVENING (GENERIC FOR RYTHMOL) 120 tablet 1  . traZODone (DESYREL) 150 MG tablet Take 75 mg by mouth at bedtime.     . triamcinolone cream (KENALOG) 0.1 % Apply to affected areas on face twice daily    . zaleplon (SONATA) 10 MG capsule Take 10 mg by mouth at bedtime.      Current Facility-Administered Medications  Medication Dose Route Frequency Provider Last Rate Last Dose  . 0.9 %  sodium chloride infusion  500 mL Intravenous Continuous  Nandigam, Venia Minks, MD        Social History   Social History  . Marital status: Legally Separated    Spouse name: N/A  .  Number of children: 3  . Years of education: N/A   Occupational History  . retired Retired   Social History Main Topics  . Smoking status: Former Smoker    Quit date: 08/30/1996  . Smokeless tobacco: Never Used  . Alcohol use 3.6 oz/week    6 Glasses of wine per week     Comment: daily  . Drug use: No  . Sexual activity: Yes   Other Topics Concern  . Not on file   Social History Narrative   Lives in [place],  Former [employer],  Separated.      Family History  Problem Relation Age of Onset  . Anesthesia problems Mother   . Colon polyps Mother        beign  . Colon cancer Neg Hx    ROS General: Negative; No fevers, chills, or night sweats;  HEENT: Negative; No changes in vision or hearing, sinus congestion, difficulty swallowing Pulmonary: Negative; No cough, wheezing, shortness of breath, hemoptysis Cardiovascular:  See HPI GI: Positive for irritable bowel syndrome for which she takes Bentyl before meals;  GU: Negative; No dysuria, hematuria, or difficulty voiding Musculoskeletal: Negative; no myalgias, joint pain, or weakness Hematologic/Oncology: Negative; no easy bruising, bleeding Endocrine: Negative; no heat/cold intolerance; no diabetes Neuro: Negative; no changes in balance, headaches Skin: Negative; No rashes or skin lesions Psychiatric: There is less stress and anxiety; No behavioral problems, depression Sleep: Negative; No snoring, daytime sleepiness, hypersomnolence, bruxism, restless legs, hypnogognic hallucinations, no cataplexy Other comprehensive 14 point system review is negative.   PE BP (!) 100/56   Pulse 66   Ht '5\' 6"'  (1.676 m)   Wt 146 lb (66.2 kg)   BMI 23.57 kg/m    Repeat blood pressure by me was 116/74 supine and 98/68 standing  Wt Readings from Last 3 Encounters:  03/04/17 146 lb (66.2 kg)   11/25/16 147 lb (66.7 kg)  11/13/16 146 lb (66.2 kg)   General: Alert, oriented, no distress.  Skin: normal turgor, no rashes, warm and dry HEENT: Normocephalic, atraumatic. Pupils equal round and reactive to light; sclera anicteric; extraocular muscles intact;  Nose without nasal septal hypertrophy Mouth/Parynx benign; Mallinpatti scale 2 Neck: No JVD, no carotid bruits; normal carotid upstroke Lungs: clear to ausculatation and percussion; no wheezing or rales Chest wall: without tenderness to palpitation Heart: PMI not displaced, RRR, s1 s2 normal, 1/6 systolic murmur, no diastolic murmur, no rubs, gallops, thrills, or heaves Abdomen: soft, nontender; no hepatosplenomehaly, BS+; abdominal aorta nontender and not dilated by palpation. Back: no CVA tenderness Pulses 2+ Musculoskeletal: full range of motion, normal strength, no joint deformities Extremities: no clubbing cyanosis or edema, Homan's sign negative  Neurologic: grossly nonfocal; Cranial nerves grossly wnl Psychologic: Normal mood and affect   ECG (independently read by me): atrially paced rhythm with prolonged AV conduction with PR interval at 264 ms.  PAC.  QTc interval increase that 503 ms.  May 2018 ECG (independently read by me): Atrially paced rhythm at 69 bpm.  Prolonged AV conduction with PR interval 266 milliseconds.  Increased QTc interval 497 ms.  August 2016 ECG (independently read by me): Atrially paced rhythm at 66 bpm.  PR interval 232 ms.  QTc interval normal at 448 ms.  ECG (independently read by me): Atrially paced rhythm at 64 bpm.  No AF.  QTC 435 ms.  December 2015 ECG (independently read by me): Atrially paced rhythm at 65 with prolonged AV conduction with a PR interval at 270  ms.  No significant ST segment changes.   June 2015 ECG (independently read by me): Atrially paced rhythm at 62 beats per minute with prolonged AV conduction with a PR interval of 244 ms.  QTc interval 444 ms.  Prior 09/22/2013  ECG (independently read by me): Atrial paced rhythm at 63 beats per minute with ventricular sensing.   LABS:  BMP Latest Ref Rng & Units 11/25/2016 01/22/2016 05/04/2015  Glucose 65 - 99 mg/dL 97 92 97  BUN 6 - 20 mg/dL '18 16 19  ' Creatinine 0.44 - 1.00 mg/dL 1.08(H) 1.13(H) 0.97  Sodium 135 - 145 mmol/L 131(L) 134(L) 132(L)  Potassium 3.5 - 5.1 mmol/L 4.2 4.1 4.9  Chloride 101 - 111 mmol/L 100(L) 100(L) 101  CO2 22 - 32 mmol/L '24 25 27  ' Calcium 8.9 - 10.3 mg/dL 9.8 9.7 8.7(L)    Hepatic Function Latest Ref Rng & Units 01/22/2016 12/21/2014 12/20/2014  Total Protein 6.5 - 8.1 g/dL 6.9 4.8(L) 6.0(L)  Albumin 3.5 - 5.0 g/dL 4.0 2.3(L) 3.0(L)  AST 15 - 41 U/L '26 19 25  ' ALT 14 - 54 U/L 14 13(L) 14  Alk Phosphatase 38 - 126 U/L 40 37(L) 47  Total Bilirubin 0.3 - 1.2 mg/dL 0.6 0.4 0.5  Bilirubin, Direct 0.0 - 0.3 mg/dL - - -    CBC Latest Ref Rng & Units 11/25/2016 10/14/2016 01/22/2016  WBC 4.0 - 10.5 K/uL 9.4 - 5.2  Hemoglobin 12.0 - 15.0 g/dL 11.3(L) 11.8(L) 11.8(L)  Hematocrit 36.0 - 46.0 % 34.8(L) - 36.6  Platelets 150 - 400 K/uL 220 - 239   Lab Results  Component Value Date   MCV 94.6 11/25/2016   MCV 96.6 01/22/2016   MCV 92.9 05/03/2015    Lab Results  Component Value Date   TSH 6.630 (H) 08/30/2014   Lab Results  Component Value Date   HGBA1C 5.3 04/01/2011    BNP    Component Value Date/Time   PROBNP 1,110.0 (H) 09/18/2013 0332    Lipid Panel     Component Value Date/Time   CHOL  12/19/2006 0315    172        ATP III CLASSIFICATION:  <200     mg/dL   Desirable  200-239  mg/dL   Borderline High  >=240    mg/dL   High   TRIG 293 (H) 12/19/2006 0315   HDL 37 (L) 12/19/2006 0315   CHOLHDL 4.6 12/19/2006 0315   VLDL 59 (H) 12/19/2006 0315   LDLCALC  12/19/2006 0315    76        Total Cholesterol/HDL:CHD Risk Coronary Heart Disease Risk Table                     Men   Women  1/2 Average Risk   3.4   3.3     RADIOLOGY: No results found.  IMPRESSION:  1.  Paroxysmal atrial fibrillation (HCC)   2. Pacemaker   3. Essential hypertension   4. Sinus node dysfunction (HCC)   5. Anticoagulant long-term use  WITH PRADAXA, CHADS Vasc score 3     ASSESSMENT AND PLAN: Ms. Clovis Warwick Is a 71 year old Caucasian female who has a history of PAF and is status post permanent pacemaker for sinus node dysfunction. She is anticoagulated with her Pradaxa and has a Cha2ds2vasc score of at least 3. Her echo Doppler study on 10/24/2013 showed normal systolic function with an ejection fraction of 60-65%.  There was grade 1  diastolic dysfunction.  There were no regional wall motion abnormalities.  The left atrium was normal in size .  I reviewed her most recent echo Doppler study with her in detail, which again showed an EF of 55-60% with mild MR, mild LA dilation and mild pulmonary hypertension.  In the past, her episodes of atrial fibrillation have seen to occur during periods of increased stress but she has experienced several episodes of recurrent AF which do not appear to be stress mediated.  She has seen [name] for consideration of ablation with her last evaluation in April 2016.  She has opted against ablation and has continued with medical therapy.  When I last saw her, I reviewed with her new data concerning hypertensive guidelines and attempted to reinstitute losartan.  She was hypotensive when seen by [name] and her dose was reduced to 25 mg daily.  She is now taking atenolol 25 mg twice a day for rate control.  She is atrially paced on ECG.  She has a prescription for furosemide rarely takes this.  She is mildly orthostatic on exam today.  If she can.  She will monitor blood pressure closely.  If her blood pressure continues to be low.  Her losartan will need to be discontinued.  She is maintaining atrially paced rhythm with her beta blocker regimen and for this reason, I did not reduce her atenolol preparation to reduce potential for recurrent AF.   She is being followed by [name] for pacemaker interrogation in the office setting and does not do this at home.  I will see her in 6 months for follow-up evaluation.   Time spent: 25 minutes Troy Sine, MD, [facility]  03/06/2017 7:39 PM

## 2017-03-19 ENCOUNTER — Telehealth: Payer: Self-pay | Admitting: Cardiovascular Disease

## 2017-03-19 NOTE — Telephone Encounter (Signed)
New message  Pt verbalized that she is calling for the rn  Only and did not disclose any other information

## 2017-03-19 NOTE — Telephone Encounter (Signed)
Spoke with patient. She had her meds straightened out with daughter. She will monitor her home BP and HR and call back if BP is consistently low

## 2017-03-19 NOTE — Telephone Encounter (Signed)
NO additional recommendation at this time.  Patient to clarify medication and stop losartan if BP below 90 as instructed by cardiologist.

## 2017-03-19 NOTE — Telephone Encounter (Signed)
Spoke with patient. She called to clarify her medications. She states she is taking atenolol half tablet twice daily.  Reviewed with her the med changes per last visit (noted below). She states she will work with her daughter to make sure her medications are straightened out per MD changes at last appt.   Medication Instructions:  Take 25mg  Atenolol (1/2 tablet) in the AM and 25mg  (1/2 tablet) in the PM.  If BP consistently running low (90's or below)-STOP Losartan

## 2017-03-19 NOTE — Telephone Encounter (Signed)
New message      Pt c/o BP issue: STAT if pt c/o blurred vision, one-sided weakness or slurred speech  1. What are your last 5 BP readings?  98/64, 99/62 (taken at [facility]) 2. Are you having any other symptoms (ex. Dizziness, headache, blurred vision, passed out)?  No energy, headache  3. What is your BP issue?  Pt states that she was told If her bp stayed below 100, Dr [name] would take her off of her medication.  Please call

## 2017-03-19 NOTE — Telephone Encounter (Signed)
LMTCB

## 2017-03-19 NOTE — Telephone Encounter (Signed)
Returned call to patient of Dr. [name]. She is taking losartan 12.5mg  BID. She reports fatigue, no energy, headaches.  She only checked her BP twice since her last visit on 9/19 - 98/64 and 99/63.  Per last MD note on 9/19:  She was hypotensive when seen by Dr. Royann Shivers and her dose was reduced to 25 mg daily.  She is now taking atenolol 25 mg twice a day for rate control.  She is atrially paced on ECG.  She has a prescription for furosemide rarely takes this.  She is mildly orthostatic on exam today.  If she can.  She will monitor blood pressure closely.  If her blood pressure continues to be low.  Her losartan will need to be discontinued  Advised would route to MD and pharmacy staff for advice on BP medication

## 2017-04-15 ENCOUNTER — Other Ambulatory Visit: Payer: Self-pay | Admitting: Cardiovascular Disease

## 2017-06-08 ENCOUNTER — Other Ambulatory Visit: Payer: Self-pay | Admitting: *Deleted

## 2017-06-08 ENCOUNTER — Other Ambulatory Visit: Payer: Self-pay | Admitting: Cardiovascular Disease

## 2017-06-08 MED ORDER — ATENOLOL 25 MG PO TABS: 25.0000 mg | ORAL_TABLET | Freq: Two times a day (BID) | ORAL | 3 refills | Status: DC

## 2017-06-22 ENCOUNTER — Ambulatory Visit (INDEPENDENT_AMBULATORY_CARE_PROVIDER_SITE_OTHER): Payer: Medicare Other | Admitting: Adult Health

## 2017-06-22 ENCOUNTER — Encounter: Payer: Self-pay | Admitting: Adult Health

## 2017-06-22 VITALS — BP 120/62 | HR 62 | Ht 65.0 in | Wt 143.0 lb

## 2017-06-22 DIAGNOSIS — R5383 Other fatigue: Secondary | ICD-10-CM | POA: Diagnosis not present

## 2017-06-22 DIAGNOSIS — I10 Essential (primary) hypertension: Secondary | ICD-10-CM

## 2017-06-22 DIAGNOSIS — I482 Chronic atrial fibrillation: Secondary | ICD-10-CM

## 2017-06-22 DIAGNOSIS — I4821 Permanent atrial fibrillation: Secondary | ICD-10-CM

## 2017-06-22 NOTE — Patient Instructions (Signed)
Medication Instructions:  NO CHANGES-Your physician recommends that you continue on your current medications as directed. Please refer to the Current Medication list given to you today.  If you need a refill on your cardiac medications before your next appointment, please call your pharmacy.  Follow-Up: Your physician wants you to follow-up in: 6 MONTHS WITH DR [name] -AND- YOU ARE DUE FOR PACEMAKER CHECK WITH DR TDSKAJGO.  Thank you for choosing [facility] at [facility]!!

## 2017-06-22 NOTE — Progress Notes (Signed)
Cardiology Office Note   Date:  06/22/2017   ID:  Jacqueline Kim, DOB 1945-11-14, MRN [id number]  PCP:  Thayer Headings, MD  Cardiologist: Dr.  [name] Chief Complaint  Patient presents with  . Medication Problem    RA Doc started a treatment medication that caused fatigue     History of Present Illness: Jacqueline Kim is a 72 y.o. female who presents for paroxysmal atrial fib,pradaxa for anticoagulation, s/p PPM (Medtronic), HTN, ETOH dependence. She saw Dr. Royann Shivers on 11/13/2016 and her losartan dose was reduced to 25 mg daily.   Is also being seen by  Dr. Johney Frame and reconsideration of possible atrial fibrillation ablation. The last  Time she admitted that she was drinking wine on a daily basis.  Rather than pursue ablation she elected to reduce her EtOH intake.  She feels that she has done well and has felt one episode of palpitations during her period of increased stress that she is aware of since her last evaluation  She is followed by rheumatologist and has been receiving IV injections to assist with this.  Over time she has become more fatigued each time she has the infusion.  As result of this her rheumatologist stopped the medication.  She was receiving it monthly.  Stopping the medication of the last couple weeks she has been feeling much better and less fatigued.  She is followed by Dr. Luberta Robertson,, [facility], and has recent labs drawn within the last 3-4  weeks.  She is to become more active.  As result of her fatigue however and known history of coronary artery disease, her rheumatologist wanted her to see [place] sooner.  She denies chest pain, palpitations, dyspnea on exertion, near syncope.  She is medically compliant.  In fact she states she feels she is on too much medication.  Past Medical History:  Diagnosis Date  . Anticoagulant long-term use  WITH PRADAXA 05/27/2011  . Anxiety   . Cellulitis of buttock, left 12/20/2014  . Complication of anesthesia    DIFFICULTY  WAKING UP  . Depression   . Diverticulosis   . DJD (degenerative joint disease)   . Esophagitis 09/17/06  . External hemorrhoids   . GERD (gastroesophageal reflux disease)   . Glaucoma   . History of stress test    a. 2008 - normal nuc.  Marland Kitchen Hypertension   . Hypothyroid   . IBS (irritable bowel syndrome)   . Paroxysmal A-fib [facility]) diagnosed 2008   a. On propafenone, Pradaxa.  . 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 BREAST REDUCTION SURGERY    . BUNIONECTOMY Right 07/09/2015   with rods and pins  . CARDIOVERSION N/A 08/02/2014   Procedure: CARDIOVERSION;  Surgeon: Quintella Reichert, MD;  [facility];  Service: Cardiovascular;  Laterality: N/A;  . CATARACT EXTRACTION, BILATERAL Bilateral   . FACIAL COSMETIC SURGERY    . FOOT SURGERY Left   . HAND SURGERY     multiple  . LIPOSUCTION    . NM MYOCAR PERF WALL MOTION  12/29/2006   No significant ischemia; EF 69%  . OVARIAN CYST REMOVAL    . PACEMAKER INSERTION  04/08/11   MDT Revo implanted by Dr Rubie Maid  . TONSILLECTOMY    . TOTAL KNEE ARTHROPLASTY Left 05/01/2015   Procedure: TOTAL LEFT KNEE ARTHROPLASTY;  Surgeon: Durene Romans, MD;  Location: [facility];  Service: Orthopedics;  Laterality: Left;  Marland Kitchen VAGINAL HYSTERECTOMY       Current Outpatient Medications  Medication Sig Dispense Refill  . atenolol (TENORMIN) 25 MG tablet Take 1 tablet (25 mg total) by mouth 2 (two) times daily. 180 tablet 3  . docusate sodium (COLACE) 100 MG capsule Take 1 capsule (100 mg total) by mouth 2 (two) times daily. 10 capsule 0  . ELIQUIS 5 MG TABS tablet TAKE ONE (1) TABLET BY MOUTH TWO (2) TIMES DAILY 180 tablet 1  . escitalopram (LEXAPRO) 20 MG tablet Take 20 mg by mouth daily.     . furosemide (LASIX) 20 MG tablet Take 1 tablet (20 mg total) by mouth daily as needed for fluid. 30  tablet   . HYDROcodone-acetaminophen (NORCO/VICODIN) 5-325 MG tablet Take 1/2 to 1 tablet as needed for pain 15 tablet 0  . levothyroxine (SYNTHROID, LEVOTHROID) 112 MCG tablet Take 112 mcg by mouth daily before breakfast.     . linaclotide (LINZESS) 72 MCG capsule Take 1 capsule (72 mcg total) by mouth daily before breakfast. 30 capsule 3  . losartan (COZAAR) 25 MG tablet Take 1 tablet (25 mg total) by mouth daily. 90 tablet 3  . metroNIDAZOLE (METROGEL) 1 % gel Apply topically.    . propafenone (RYTHMOL) 150 MG tablet TAKE 1 AND 1/2 TABLETS EVERY MORNING, 1 TABLET MID-DAY AND 1 AND 1/2 TABLETS EVERY EVENING 360 tablet 2  . traZODone (DESYREL) 150 MG tablet Take 75 mg by mouth at bedtime.     . triamcinolone cream (KENALOG) 0.1 % Apply to affected areas on face twice daily    . AMBULATORY NON FORMULARY MEDICATION Medication Name: Nitroglycerin ointment .0125% Pea size amount per rectum three times a day 30 g 3  . Biotin 5000 MCG TABS Take 1 tablet by mouth daily.    . clindamycin (CLEOCIN) 300 MG capsule Take 1 capsule (300 mg total) by mouth 3 (three) times daily. (Patient not taking: Reported on 06/22/2017) 21 capsule 0  . dicyclomine (BENTYL) 10 MG capsule TAKE ONE CAPSULE 3 TIMES DAILY BEFORE MEALS (Patient not taking: Reported on 06/22/2017) 90 capsule 1  . diltiazem (DILT-CD) 120 MG 24 hr capsule Take 1 capsule (120 mg total) by mouth at bedtime. 90 capsule 3  . linaclotide (LINZESS) 72 MCG capsule Take 1 capsule (72 mcg total) by mouth daily before breakfast. 30 capsule 3  . zaleplon (SONATA) 10 MG capsule Take 10 mg by mouth at bedtime.      Current Facility-Administered Medications  Medication Dose Route Frequency Provider Last Rate Last Dose  . 0.9 %  sodium chloride infusion  500 mL Intravenous Continuous Nandigam, Kavitha V, MD        Allergies:   Azithromycin; Epinephrine; Penicillins; Lidocaine; and Metoprolol    Social History:  The patient  reports that she quit smoking about 20  years ago. she has never used smokeless tobacco. She reports that she drinks about 3.6 oz of alcohol per week. She reports that she does not use drugs.   Family History:  The patient's family history includes Anesthesia problems in her mother; Colon polyps in her mother.    ROS: All other systems are reviewed and negative. Unless otherwise mentioned in H&P    PHYSICAL EXAM: VS:  BP 120/62   Pulse 62   Ht 5\' 5"  (1.651 m)   Wt 143 lb (64.9 kg)   BMI 23.80 kg/m  , BMI  Body mass index is 23.8 kg/m. GEN: Well nourished, well developed, in no acute distress  HEENT: normal  Neck: no JVD, carotid bruits, or masses Cardiac: RRR; no murmurs, rubs, or gallops,no edema  Respiratory:  clear to auscultation bilaterally, normal work of breathing GI: soft, nontender, nondistended, + BS MS: no deformity or atrophy  Skin: warm and dry, no rash Neuro:  Strength and sensation are intact Psych: euthymic mood, full affect   EKG: Atrial paced rhythm heart rate of 62 bpm.  Recent Labs: 11/25/2016: BUN 18; Creatinine, Ser 1.08; Hemoglobin 11.3; Platelets 220; Potassium 4.2; Sodium 131    Lipid Panel    Component Value Date/Time   CHOL  12/19/2006 0315    172        ATP III CLASSIFICATION:  <200     mg/dL   Desirable  948-546  mg/dL   Borderline High  >=270    mg/dL   High   TRIG 350 (H) 09/38/1829 0315   HDL 37 (L) 12/19/2006 0315   CHOLHDL 4.6 12/19/2006 0315   VLDL 59 (H) 12/19/2006 0315   LDLCALC  12/19/2006 0315    76        Total Cholesterol/HDL:CHD Risk Coronary Heart Disease Risk Table                     Men   Women  1/2 Average Risk   3.4   3.3      Wt Readings from Last 3 Encounters:  06/22/17 143 lb (64.9 kg)  03/04/17 146 lb (66.2 kg)  11/25/16 147 lb (66.7 kg)      Other studies Reviewed: Echocardiogram 11/17/2016 Left ventricle: The cavity size was normal. Wall thickness was   normal. Systolic function was normal. The estimated ejection   fraction was in the  range of 55% to 60%. Left ventricular   diastolic function parameters were normal. - Mitral valve: There was mild regurgitation. - Left atrium: The atrium was mildly dilated. - Pulmonary arteries: PA peak pressure: 38 mm Hg (S).  ASSESSMENT AND PLAN:  1.  Fatigue: Uncertain etiology at this time.  Overnight to get labs on her but she has had recent labs in the last 2-3 weeks per primary care.  I am requesting results of this.  Is feeling much better after stopping infusions received by rheumatologist for RA.  She states that her energy level has improved.  She denies any chest pain or dyspnea.  I will review her labs.  If I see any abnormalities which require follow-up I will order more labs at that time.  Recent echocardiogram which is completed in May 2018 revealing normal LV systolic function.  I have offered to follow-up with a nuclear medicine stress test.  She would like to wait on this unless her symptoms become worse again or she has new symptoms.  No changes in her medication regimen.  2.  Atrial fibrillation: She remains on diltiazem for heart rate control.  Is not on anticoagulation at her request.  3.  Permanent pacemaker in situ: She is followed by Dr. Royann Shivers, in the pacemaker clinic.  She is due to see him within the next couple of months for interrogation.  4.  Rheumatoid arthritis: I do not have documentation of which medication she was being given through her IV for treatment.  The patient is feeling much better off of this medication.  I have offered to repeat her stress test but she has refused as her fatigue  is gotten better, and her arthritis pain has not worsened.  She remains on hydrocodone for pain control.   Current medicines are reviewed at length with the patient today.    Labs/ tests ordered today include: Requesting labs from PCP  Bettey Mare. Liborio Nixon, ANP, AACC   06/22/2017 2:54 PM    [facility] [address], [place], [place]  [address] Phone: [phone]; Fax: [phone]

## 2017-07-27 ENCOUNTER — Other Ambulatory Visit: Payer: Self-pay | Admitting: Physical Medicine and Rehabilitation

## 2017-07-27 DIAGNOSIS — M5136 Other intervertebral disc degeneration, lumbar region: Secondary | ICD-10-CM

## 2017-08-04 ENCOUNTER — Other Ambulatory Visit: Payer: Medicare Other

## 2017-08-10 ENCOUNTER — Encounter: Payer: Self-pay | Admitting: Cardiovascular Disease

## 2017-08-10 ENCOUNTER — Ambulatory Visit (INDEPENDENT_AMBULATORY_CARE_PROVIDER_SITE_OTHER): Payer: Medicare Other | Admitting: Cardiovascular Disease

## 2017-08-10 VITALS — BP 112/72 | HR 65 | Ht 65.0 in | Wt 147.0 lb

## 2017-08-10 DIAGNOSIS — Z950 Presence of cardiac pacemaker: Secondary | ICD-10-CM

## 2017-08-10 DIAGNOSIS — Z7901 Long term (current) use of anticoagulants: Secondary | ICD-10-CM

## 2017-08-10 DIAGNOSIS — I495 Sick sinus syndrome: Secondary | ICD-10-CM | POA: Diagnosis not present

## 2017-08-10 DIAGNOSIS — I480 Paroxysmal atrial fibrillation: Secondary | ICD-10-CM

## 2017-08-10 DIAGNOSIS — I10 Essential (primary) hypertension: Secondary | ICD-10-CM

## 2017-08-10 NOTE — Progress Notes (Signed)
Cardiology Office Note    Date:  08/10/2017   ID:  Jacqueline Kim, DOB Feb 21, 1946, MRN [id number]  PCP:  Georgianne Fick, MD  Cardiologist:  Nicki Guadalajara, MD; Thurmon Fair, MD   Chief Complaint  Patient presents with  . Follow-up    History of Present Illness:  Jacqueline Kim is a 72 y.o. female with paroxysmal atrial fibrillation and tachy-brady sd., here for pacemaker check.  She has not had any palpitations since her last office appointment.  Feels well and is very physically active.  No falls, neurological events or bleeding problems.  Medtronic Revo (2012) device is functioning normally. The current burden of atrial fibrillation is zero. She has not even had any of the atrial tachycardia in the 6 months since her last check..  She has 90% atrial pacing due to sinus bradycardia, but no V pacing. Battery voltage is 2.97V (RRT 2.81V). Lead parameters remain excellent. Heart rate histograms are just a little blunted, stable from last visit. Activity level is usually around 3 hours a day.  Past Medical History:  Diagnosis Date  . Anticoagulant long-term use  WITH PRADAXA 05/27/2011  . Anxiety   . Cellulitis of buttock, left 12/20/2014  . Complication of anesthesia    DIFFICULTY WAKING UP  . Depression   . Diverticulosis   . DJD (degenerative joint disease)   . Esophagitis 09/17/06  . External hemorrhoids   . GERD (gastroesophageal reflux disease)   . Glaucoma   . History of stress test    a. 2008 - normal nuc.  Marland Kitchen Hypertension   . Hypothyroid   . IBS (irritable bowel syndrome)   . Paroxysmal A-fib [facility]) diagnosed 2008   a. On propafenone, Pradaxa.  . 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 BREAST REDUCTION SURGERY    . BUNIONECTOMY Right 07/09/2015   with rods and pins  . CARDIOVERSION N/A 08/02/2014   Procedure: CARDIOVERSION;  Surgeon: Quintella Reichert, MD;  Location: [facility];  Service: Cardiovascular;  Laterality: N/A;  . CATARACT EXTRACTION, BILATERAL Bilateral   . FACIAL COSMETIC SURGERY    . FOOT SURGERY Left   . HAND SURGERY     multiple  . LIPOSUCTION    . NM MYOCAR PERF WALL MOTION  12/29/2006   No significant ischemia; EF 69%  . OVARIAN CYST REMOVAL    . PACEMAKER INSERTION  04/08/11   MDT Revo implanted by Dr Rubie Maid  . TONSILLECTOMY    . TOTAL KNEE ARTHROPLASTY Left 05/01/2015   Procedure: TOTAL LEFT KNEE ARTHROPLASTY;  Surgeon: Durene Romans, MD;  Location: [facility];  Service: Orthopedics;  Laterality: Left;  Marland Kitchen VAGINAL HYSTERECTOMY      Current Medications: Outpatient Medications Prior to Visit  Medication Sig Dispense Refill  . AMBULATORY NON FORMULARY MEDICATION Medication Name: Nitroglycerin ointment .0125% Pea size amount per rectum three times a day 30 g 3  . atenolol (TENORMIN) 25 MG tablet Take 1 tablet (25 mg total) by mouth 2 (two) times daily. 180 tablet 3  . Biotin 5000 MCG TABS Take 1 tablet by mouth daily.    . clindamycin (CLEOCIN) 300 MG capsule Take 1 capsule (300 mg total) by mouth 3 (three) times daily. 21 capsule 0  . dicyclomine (BENTYL) 10 MG capsule TAKE ONE CAPSULE 3 TIMES DAILY BEFORE MEALS 90 capsule 1  .  diltiazem (DILT-CD) 120 MG 24 hr capsule Take 1 capsule (120 mg total) by mouth at bedtime. 90 capsule 3  . docusate sodium (COLACE) 100 MG capsule Take 1 capsule (100 mg total) by mouth 2 (two) times daily. 10 capsule 0  . ELIQUIS 5 MG TABS tablet TAKE ONE (1) TABLET BY MOUTH TWO (2) TIMES DAILY 180 tablet 1  . escitalopram (LEXAPRO) 20 MG tablet Take 20 mg by mouth daily.     . furosemide (LASIX) 20 MG tablet Take 1 tablet (20 mg total) by mouth daily as needed for fluid. 30 tablet   . HYDROcodone-acetaminophen (NORCO/VICODIN) 5-325 MG tablet Take 1/2 to 1 tablet as needed for pain 15 tablet 0  . levothyroxine (SYNTHROID, LEVOTHROID) 112 MCG  tablet Take 112 mcg by mouth daily before breakfast.     . linaclotide (LINZESS) 72 MCG capsule Take 1 capsule (72 mcg total) by mouth daily before breakfast. 30 capsule 3  . linaclotide (LINZESS) 72 MCG capsule Take 1 capsule (72 mcg total) by mouth daily before breakfast. 30 capsule 3  . losartan (COZAAR) 25 MG tablet Take 1 tablet (25 mg total) by mouth daily. 90 tablet 3  . metroNIDAZOLE (METROGEL) 1 % gel Apply topically.    . propafenone (RYTHMOL) 150 MG tablet TAKE 1 AND 1/2 TABLETS EVERY MORNING, 1 TABLET MID-DAY AND 1 AND 1/2 TABLETS EVERY EVENING 360 tablet 2  . traZODone (DESYREL) 150 MG tablet Take 75 mg by mouth at bedtime.     . triamcinolone cream (KENALOG) 0.1 % Apply to affected areas on face twice daily    . zaleplon (SONATA) 10 MG capsule Take 10 mg by mouth at bedtime.      Facility-Administered Medications Prior to Visit  Medication Dose Route Frequency Provider Last Rate Last Dose  . 0.9 %  sodium chloride infusion  500 mL Intravenous Continuous Nandigam, Kavitha V, MD         Allergies:   Azithromycin; Epinephrine; Penicillins; Lidocaine; and Metoprolol   Social History   Socioeconomic History  . Marital status: Legally Separated    Spouse name: None  . Number of children: 3  . Years of education: None  . Highest education level: None  Social Needs  . Financial resource strain: None  . Food insecurity - worry: None  . Food insecurity - inability: None  . Transportation needs - medical: None  . Transportation needs - non-medical: None  Occupational History  . Occupation: retired    Associate Professor: RETIRED  Tobacco Use  . Smoking status: Former Smoker    Last attempt to quit: 08/30/1996    Years since quitting: 20.9  . Smokeless tobacco: Never Used  Substance and Sexual Activity  . Alcohol use: Yes    Alcohol/week: 3.6 oz    Types: 6 Glasses of wine per week    Comment: daily  . Drug use: No  . Sexual activity: Yes  Other Topics Concern  . None  Social  History Narrative   Lives in [place],  Former [employer],  Separated.       Family History:  The patient's family history includes Anesthesia problems in her mother; Colon polyps in her mother.   ROS:   Please see the history of present illness.    ROS All other systems reviewed and are negative.   PHYSICAL EXAM:   VS:  BP 112/72 (BP Location: Right Arm, Patient Position: Sitting, Cuff Size: Normal)   Pulse 65   Ht 5\' 5"  (  1.651 m)   Wt 147 lb (66.7 kg)   SpO2 96%   BMI 24.46 kg/m    GEN: Well nourished, well developed, in no acute distress  HEENT: normal  Neck: no JVD, carotid bruits, or masses Cardiac: RRR; no murmurs, rubs, or gallops,no edema , healthy left subclavian pacemaker site Respiratory:  clear to auscultation bilaterally, normal work of breathing GI: soft, nontender, nondistended, + BS MS: no deformity or atrophy  Skin: warm and dry, no rash Neuro:  Alert and Oriented x 3, Strength and sensation are intact Psych: euthymic mood, full affect  Wt Readings from Last 3 Encounters:  08/10/17 147 lb (66.7 kg)  06/22/17 143 lb (64.9 kg)  03/04/17 146 lb (66.2 kg)      Studies/Labs Reviewed:   EKG:  EKG is not ordered today.    Recent Labs: 11/25/2016: BUN 18; Creatinine, Ser 1.08; Hemoglobin 11.3; Platelets 220; Potassium 4.2; Sodium 131   Lipid Panel    Component Value Date/Time   CHOL  12/19/2006 0315    172        ATP III CLASSIFICATION:  <200     mg/dL   Desirable  174-081  mg/dL   Borderline High  >=448    mg/dL   High   TRIG 185 (H) 63/14/9702 0315   HDL 37 (L) 12/19/2006 0315   CHOLHDL 4.6 12/19/2006 0315   VLDL 59 (H) 12/19/2006 0315   LDLCALC  12/19/2006 0315    76        Total Cholesterol/HDL:CHD Risk Coronary Heart Disease Risk Table                     Men   Women  1/2 Average Risk   3.4   3.3    Additional studies/ records that were reviewed today include:  Notes from Joni Reining Jun 22, 2017 and Daphene Jaeger Mar 04, 2017  ASSESSMENT:    1. Paroxysmal atrial fibrillation (HCC)   2. Long term (current) use of anticoagulants   3. Tachycardia-bradycardia syndrome (HCC)   4. Pacemaker   5. Essential hypertension      PLAN:  In order of problems listed above:  1. AFib: None has been detected since July 2017. On propafenone. CHADSVasc 3. 2. Eliquis: Well-tolerated, no recent falls or bleeding events 3. SSS: Offered to make changes to her rate response sensor, she does not feel she needs that. Reevaluate in 6 months. 4. PPM:  Prefers not to perform remote downloads. Plan office checks every 6 months. 5. HTN: Excellent blood pressure today  Medication Adjustments/Labs and Tests Ordered: Current medicines are reviewed at length with the patient today.  Concerns regarding medicines are outlined above.  Medication changes, Labs and Tests ordered today are listed in the Patient Instructions below. Patient Instructions  Dr Royann Shivers recommends that you schedule a follow-up appointment in 6 months with a pacemaker check. You will receive a reminder letter in the mail two months in advance. If you don't receive a letter, please call our office to schedule the follow-up appointment.  If you need a refill on your cardiac medications before your next appointment, please call your pharmacy.    Signed, Thurmon Fair, MD  08/10/2017 3:53 PM    [facility] [address] [place], [place], [place]  [address] Phone: [phone]; Fax: [phone]

## 2017-08-10 NOTE — Patient Instructions (Signed)
Dr Croitoru recommends that you schedule a follow-up appointment in 6 months with a pacemaker check. You will receive a reminder letter in the mail two months in advance. If you don't receive a letter, please call our office to schedule the follow-up appointment.  If you need a refill on your cardiac medications before your next appointment, please call your pharmacy. 

## 2017-08-17 LAB — CUP PACEART INCLINIC DEVICE CHECK
Battery Voltage: 2.99 V
Brady Statistic AP VP Percent: 0 %
Brady Statistic RA Percent Paced: 87.83 %
Date Time Interrogation Session: 20180531133536
Implantable Lead Location: 753859
Implantable Lead Location: 753860
Implantable Pulse Generator Implant Date: 20121023
Lead Channel Impedance Value: 1504 Ohm
Lead Channel Sensing Intrinsic Amplitude: 8.033 mV
Lead Channel Setting Pacing Pulse Width: 0.6 ms
Lead Channel Setting Sensing Sensitivity: 0.9 mV
MDC IDC LEAD IMPLANT DT: 20121023
MDC IDC LEAD IMPLANT DT: 20121023
MDC IDC MSMT LEADCHNL RA IMPEDANCE VALUE: 512 Ohm
MDC IDC MSMT LEADCHNL RA SENSING INTR AMPL: 2.913 mV
MDC IDC SET LEADCHNL RA PACING AMPLITUDE: 2 V
MDC IDC SET LEADCHNL RV PACING AMPLITUDE: 2.5 V
MDC IDC STAT BRADY AP VS PERCENT: 87.83 %
MDC IDC STAT BRADY AS VP PERCENT: 0 %
MDC IDC STAT BRADY AS VS PERCENT: 12.17 %
MDC IDC STAT BRADY RV PERCENT PACED: 0 %

## 2017-08-20 ENCOUNTER — Ambulatory Visit
Admission: RE | Admit: 2017-08-20 | Discharge: 2017-08-20 | Disposition: A | Discharge status: Home / Self Care | Payer: Medicare Other | Source: Ambulatory Visit | Attending: Physical Medicine and Rehabilitation | Admitting: Physical Medicine and Rehabilitation

## 2017-08-20 DIAGNOSIS — M5136 Other intervertebral disc degeneration, lumbar region: Secondary | ICD-10-CM

## 2017-09-23 ENCOUNTER — Other Ambulatory Visit: Payer: Self-pay | Admitting: Neurological Surgery

## 2017-09-23 DIAGNOSIS — M4316 Spondylolisthesis, lumbar region: Secondary | ICD-10-CM

## 2017-09-23 DIAGNOSIS — M542 Cervicalgia: Secondary | ICD-10-CM

## 2017-09-24 NOTE — Progress Notes (Signed)
Phone call to patient to verify medication list and allergies. Pt informed will need to stop lexapro and trazodone 48 hrs prior to myelogram procedure. Pt will also need to stop eliquis 48hrs prior to myelogram pending approval from cardiologist. Awaiting response from cardiologist, thinner clearance faxed. Pt verbalized understanding.

## 2017-09-25 ENCOUNTER — Telehealth: Payer: Self-pay | Admitting: Cardiovascular Disease

## 2017-09-25 NOTE — Telephone Encounter (Signed)
   [facility] Pre-operative Risk Assessment    Request for surgical clearance:  1. What type of surgery is being performed? Myelogram    2. When is this surgery scheduled? TBD   3. What type of clearance is required (medical clearance vs. Pharmacy clearance to hold med vs. Both)? Pharmacy   4. Are there any medications that need to be held prior to surgery and how long? Elquis - 48 hours prior   5. Practice name and name of physician performing surgery? [facility]   6. What is your office phone number [phone])    7.   What is your office fax number [phone])  8.   Anesthesia type (None, local, MAC, general) ? None    Sheral Apley M 09/25/2017, 2:08 PM  _________________________________________________________________   (provider comments below)

## 2017-09-29 NOTE — Telephone Encounter (Signed)
Clearance faxed via faxed machine 

## 2017-09-29 NOTE — Telephone Encounter (Signed)
Patient with diagnosis of Afib on Eliquis for anticoagulation.    Procedure: myelogram Date of procedure: TBD  CHADS2-VASc score of  3 (CHF, HTN, AGE, DM2, stroke/tia x 2, CAD, AGE, female)  CrCl 64ml/min  Per office protocol, patient can hold Eliquis for 3 days prior to procedure.

## 2017-10-09 ENCOUNTER — Ambulatory Visit
Admission: RE | Admit: 2017-10-09 | Discharge: 2017-10-09 | Disposition: A | Discharge status: Home / Self Care | Payer: Medicare Other | Source: Ambulatory Visit | Attending: Neurological Surgery | Admitting: Neurological Surgery

## 2017-10-09 ENCOUNTER — Other Ambulatory Visit: Payer: Medicare Other

## 2017-10-09 DIAGNOSIS — M4316 Spondylolisthesis, lumbar region: Secondary | ICD-10-CM

## 2017-10-09 DIAGNOSIS — M542 Cervicalgia: Secondary | ICD-10-CM

## 2017-10-09 MED ORDER — IOPAMIDOL (ISOVUE-M 300) INJECTION 61%
10.0000 mL | Freq: Once | INTRAMUSCULAR | Status: AC | PRN
  Administered 2017-10-09: 10 mL via INTRATHECAL

## 2017-10-09 MED ORDER — ONDANSETRON HCL 4 MG/2ML IJ SOLN
4.0000 mg | Freq: Once | INTRAMUSCULAR | Status: AC
  Administered 2017-10-09: 4 mg via INTRAMUSCULAR

## 2017-10-09 MED ORDER — DIAZEPAM 5 MG PO TABS
5.0000 mg | ORAL_TABLET | Freq: Once | ORAL | Status: AC
  Administered 2017-10-09: 5 mg via ORAL

## 2017-10-09 MED ORDER — MEPERIDINE HCL 50 MG/ML IJ SOLN
50.0000 mg | Freq: Once | INTRAMUSCULAR | Status: AC
  Administered 2017-10-09: 50 mg via INTRAMUSCULAR

## 2017-10-09 NOTE — Discharge Instructions (Signed)
Myelogram Discharge Instructions  1. Go home and rest quietly for the next 24 hours.  It is important to lie flat for the next 24 hours.  Get up only to go to the restroom.  You may lie in the bed or on a couch on your back, your stomach, your left side or your right side.  You may have one pillow under your head.  You may have pillows between your knees while you are on your side or under your knees while you are on your back.  2. DO NOT drive today.  Recline the seat as far back as it will go, while still wearing your seat belt, on the way home.  3. You may get up to go to the bathroom as needed.  You may sit up for 10 minutes to eat.  You may resume your normal diet and medications unless otherwise indicated.  Drink lots of extra fluids today and tomorrow.  4. The incidence of headache, nausea, or vomiting is about 5% (one in 20 patients).  If you develop a headache, lie flat and drink plenty of fluids until the headache goes away.  Caffeinated beverages may be helpful.  If you develop severe nausea and vomiting or a headache that does not go away with flat bed rest, call [phone].  5. You may resume normal activities after your 24 hours of bed rest is over; however, do not exert yourself strongly or do any heavy lifting tomorrow. If when you get up you have a headache when standing, go back to bed and force fluids for another 24 hours.  6. Call your physician for a follow-up appointment.  The results of your myelogram will be sent directly to your physician by the following day.  7. If you have any questions or if complications develop after you arrive home, please call [phone].  Discharge instructions have been explained to the patient.  The patient, or the person responsible for the patient, fully understands these instructions.  YOU MAY RESTART YOUR ELIQUIS TODAY. YOU MAY RESTART YOUR LEXAPRO AND TRAZODONE TOMORROW 10/10/2017 AT 09:30AM.

## 2017-10-23 ENCOUNTER — Encounter: Payer: Self-pay | Admitting: Cardiovascular Disease

## 2017-10-23 ENCOUNTER — Ambulatory Visit (INDEPENDENT_AMBULATORY_CARE_PROVIDER_SITE_OTHER): Payer: Medicare Other | Admitting: Cardiovascular Disease

## 2017-10-23 VITALS — BP 108/69 | HR 65 | Ht 65.0 in | Wt 145.0 lb

## 2017-10-23 DIAGNOSIS — M419 Scoliosis, unspecified: Secondary | ICD-10-CM

## 2017-10-23 DIAGNOSIS — M069 Rheumatoid arthritis, unspecified: Secondary | ICD-10-CM

## 2017-10-23 DIAGNOSIS — I10 Essential (primary) hypertension: Secondary | ICD-10-CM | POA: Diagnosis not present

## 2017-10-23 DIAGNOSIS — Z950 Presence of cardiac pacemaker: Secondary | ICD-10-CM | POA: Diagnosis not present

## 2017-10-23 DIAGNOSIS — I480 Paroxysmal atrial fibrillation: Secondary | ICD-10-CM | POA: Diagnosis not present

## 2017-10-23 DIAGNOSIS — Z7901 Long term (current) use of anticoagulants: Secondary | ICD-10-CM

## 2017-10-23 NOTE — Progress Notes (Signed)
Patient ID: Jacqueline Kim, female   DOB: October 09, 1945, 72 y.o.   MRN: [id number]     HPI: Jacqueline Kim is a 72 y.o. female  who presents for an 8 month follow-up cardiologic evaluation.  Jacqueline Kim has known history of sinus node dysfunction. She has history of paroxysmal atrial fibrillation. She is status post permanent pacemaker implanted on 04/08/2011 Medtronic Revo MRI compatible pacemaker. She also has a history of hypothyroidism. She has had recurrent episodes of paroxysmal atrial fibrillation and has been on pradaxa anticoagulation for several years after initially having been on warfarin.  She has been maintained on rhythmol SR 325 mg twice a day and atenolol 50 mg daily.  She also is on losartan 50 mg for hypertension.  She was hospitalized in January 2014 with recurrent AF in the setting of significant increased family stress. She is now separated and living in a different household from her husband [name].  She has had several episodes of recurrent AF and had an episode on 03/12/2013 which lasted for approximately 12 hours.  On the Saturday before Easter 2015 when she was rushing with family members and trying to prepare dinner she was hospitalized and presented with a heart rate at 150.  She was started on IV Cardizem for rate control.  She spontaneously cardioverted to sinus rhythm on April 6 prior to a planned TEE cardioversion.  I saw her in followup of that hospitalization and suggested an EP evaluation.  She saw [name] for discussion concerning atrial fibrillation ablation.  Apparently, at that time, the patient was feeling well. She opted against pursuing AF ablation presently.    A followup echo Doppler study on 10/24/2013 showed an ejection fraction of 60-65% and there was evidence for grade 1 diastolic function without wall motion abnormalities.  Her left atrial dimension was normal.    She is followed by [name] for her pacemaker.  She was hospitalized in early  February 2016 following a prolonged episode of atrial fibrillation with rapid ventricular response for which she was symptomatic.  She underwent her initial cardioversion on 07/27/2014.  When she saw [name] on August 08, 2014 interrogation of her pacemaker showed a 28 hour episode of atrial fibrillation without recurrence since the cardioversion.  She again developed a recurrent episode of atrial fibrillation on 08/30/2014 which led to an emergency room evaluation.  I last saw her in March.  I recommended follow-up evaluation with [name] and reconsideration of possible atrial fibrillation ablation.  She saw [name] at that time admitted that she was drinking wine on a daily basis.  Rather than pursue ablation she elected to reduce her EtOH intake.  She feels that she has done well and has felt one episode of palpitations during her period of increased stress that she is aware of since her last evaluation..  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  She denies exertional chest pain, shortness of breath, orthopnea, PND, presyncope or syncope or any neurologic sequelae.  When she met with [name].  There was no discussion of changing her anti-rhythmic therapy.    She underwent left knee surgery by [name].  She also fell on December 20, 2014 in the shower and was hospitalized for 2 days.  She developed a cellulitis and buttock abscess, which required anabiotic therapy.     She  lives at [facility] in a second floor unit.  She denies any episodes of chest pain.  At times she notes some shortness of breath with activity.  She saw [name] in October 2017 and her pacemaker was functioning normally.  She did not have any recurrent atrial fibrillation and had only 2 brief episodes of PAT which combined last a total of 54 seconds.  She states she had stopped taking the losartan, which had been 50 mg because that time, she noted some lightheadedness  unassociated with any rhythm disturbance.  In August 2017, a CT of her abdomen was performed which did not explain some of her issues at the time of rectal bleeding and constipation.  She was found to have mild colonic diverticulosis without diverticulitis.  Incidentally, she was noted to have abdominal aortic atherosclerosis.    When I last saw her, she was hypertensive and I recommended reinstitution of losartan at least 25 mg depending upon her blood pressure, but if necessary to increase to 25 mg 3 times a day.  Since I last saw her in May 2018, she underwent an echo Doppler study which showed an EF of 55-60%.  She had normal diastolic parameters.  There was mild mitral regurgitation, mild left atrial dilation, and mild pulmonary hypertension with a PA peak pressure 38 mm.  She saw [name] on 11/13/2016 and her losartan dose was reduced to 25 mg daily.  She sees Dr. Naida Sleight for rheumatoid arthritis.  She is status post left knee replacement and tolerated this well.  Her primary physician is [name].    Since I last saw her, she tells me she also has had issues with scoliosis and in the future may require possible surgery.  At times she has noticed some dragging of her left leg.  She is unaware of any recurrent episodes of atrial fibrillation.  Her last echo Doppler study in May 2018 showed an EF of 55-60%.  She had mild mitral regurgitation.  There was mild pulmonary hypertension with a PA pressure 38.  She had normal diastolic parameters.  She last saw [name] in February 2019.  She has not had any atrial fibrillation detected since July 2017.  She does not do remote downloads and sees him at six-month intervals.  She denies chest pain.  She denies PND or orthopnea.  She may ultimately require spinal injections.  She presents for evaluation She denies any awareness of recurrent atrial fibrillation.  She denies chest pressure.  She denies shortness of breath.  She presents for  evaluation.  Past Medical History:  Diagnosis Date  . Anticoagulant long-term use  WITH PRADAXA 05/27/2011  . Anxiety   . Cellulitis of buttock, left 12/20/2014  . Complication of anesthesia    DIFFICULTY WAKING UP  . Depression   . Diverticulosis   . DJD (degenerative joint disease)   . Esophagitis 09/17/06  . External hemorrhoids   . GERD (gastroesophageal reflux disease)   . Glaucoma   . History of stress test    a. 2008 - normal nuc.  Marland Kitchen Hypertension   . Hypothyroid   . IBS (irritable bowel syndrome)   . Paroxysmal A-fib [facility]) diagnosed 2008   a. On propafenone, Pradaxa.  . 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 BREAST REDUCTION SURGERY    . BUNIONECTOMY Right 07/09/2015   with rods and pins  .  CARDIOVERSION N/A 08/02/2014   Procedure: CARDIOVERSION;  Surgeon: Sueanne Margarita, MD;  Location: [place];  Service: Cardiovascular;  Laterality: N/A;  . CATARACT EXTRACTION, BILATERAL Bilateral   . FACIAL COSMETIC SURGERY    . FOOT SURGERY Left   . HAND SURGERY     multiple  . LIPOSUCTION    . NM MYOCAR PERF WALL MOTION  12/29/2006   No significant ischemia; EF 69%  . OVARIAN CYST REMOVAL    . PACEMAKER INSERTION  04/08/11   MDT Revo implanted by Dr Recardo Evangelist  . TONSILLECTOMY    . TOTAL KNEE ARTHROPLASTY Left 05/01/2015   Procedure: TOTAL LEFT KNEE ARTHROPLASTY;  Surgeon: Paralee Cancel, MD;  Location: [facility];  Service: Orthopedics;  Laterality: Left;  Marland Kitchen VAGINAL HYSTERECTOMY      Allergies  Allergen Reactions  . Azithromycin Other (See Comments)    Causes heart rhythm issues  . Epinephrine Other (See Comments)    Causes A-Fib  . Penicillins Anaphylaxis    Has patient had a PCN reaction causing immediate rash, facial/tongue/throat swelling, SOB or lightheadedness with hypotension: Yes Has patient had a PCN  reaction causing severe rash involving mucus membranes or skin necrosis: No Has patient had a PCN reaction that required hospitalization No Has patient had a PCN reaction occurring within the last 10 years: No If all of the above answers are "NO", then may proceed with Cephalosporin use.   . Lidocaine Palpitations    Or any similar medication  . Metoprolol Other (See Comments)    alopecia    Current Outpatient Medications  Medication Sig Dispense Refill  . AMBULATORY NON FORMULARY MEDICATION Medication Name: Nitroglycerin ointment .0125% Pea size amount per rectum three times a day 30 g 3  . atenolol (TENORMIN) 25 MG tablet Take 1 tablet (25 mg total) by mouth 2 (two) times daily. 180 tablet 3  . Biotin 5000 MCG TABS Take 1 tablet by mouth daily.    . clindamycin (CLEOCIN) 300 MG capsule Take 1 capsule (300 mg total) by mouth 3 (three) times daily. 21 capsule 0  . dicyclomine (BENTYL) 10 MG capsule TAKE ONE CAPSULE 3 TIMES DAILY BEFORE MEALS 90 capsule 1  . diltiazem (DILT-CD) 120 MG 24 hr capsule Take 1 capsule (120 mg total) by mouth at bedtime. 90 capsule 3  . docusate sodium (COLACE) 100 MG capsule Take 1 capsule (100 mg total) by mouth 2 (two) times daily. 10 capsule 0  . ELIQUIS 5 MG TABS tablet TAKE ONE (1) TABLET BY MOUTH TWO (2) TIMES DAILY 180 tablet 1  . escitalopram (LEXAPRO) 20 MG tablet Take 20 mg by mouth daily.     . furosemide (LASIX) 20 MG tablet Take 1 tablet (20 mg total) by mouth daily as needed for fluid. 30 tablet   . HYDROcodone-acetaminophen (NORCO/VICODIN) 5-325 MG tablet Take 1/2 to 1 tablet as needed for pain 15 tablet 0  . levothyroxine (SYNTHROID, LEVOTHROID) 112 MCG tablet Take 112 mcg by mouth daily before breakfast.     . linaclotide (LINZESS) 72 MCG capsule Take 1 capsule (72 mcg total) by mouth daily before breakfast. 30 capsule 3  . linaclotide (LINZESS) 72 MCG capsule Take 1 capsule (72 mcg total) by mouth daily before breakfast. 30 capsule 3  .  metroNIDAZOLE (METROGEL) 1 % gel Apply topically.    . propafenone (RYTHMOL) 150 MG tablet TAKE 1 AND 1/2 TABLETS EVERY MORNING, 1 TABLET MID-DAY AND 1 AND 1/2 TABLETS EVERY EVENING 360 tablet 2  .  traZODone (DESYREL) 150 MG tablet Take 75 mg by mouth at bedtime.     . triamcinolone cream (KENALOG) 0.1 % Apply to affected areas on face twice daily     Current Facility-Administered Medications  Medication Dose Route Frequency Provider Last Rate Last Dose  . 0.9 %  sodium chloride infusion  500 mL Intravenous Continuous Nandigam, Venia Minks, MD        Social History   Socioeconomic History  . Marital status: Legally Separated    Spouse name: Not on file  . Number of children: 3  . Years of education: Not on file  . Highest education level: Not on file  Occupational History  . Occupation: retired    Fish farm manager: RETIRED  Social Needs  . Financial resource strain: Not on file  . Food insecurity:    Worry: Not on file    Inability: Not on file  . Transportation needs:    Medical: Not on file    Non-medical: Not on file  Tobacco Use  . Smoking status: Former Smoker    Last attempt to quit: 08/30/1996    Years since quitting: 21.1  . Smokeless tobacco: Never Used  Substance and Sexual Activity  . Alcohol use: Yes    Alcohol/week: 3.6 oz    Types: 6 Glasses of wine per week    Comment: daily  . Drug use: No  . Sexual activity: Yes  Lifestyle  . Physical activity:    Days per week: Not on file    Minutes per session: Not on file  . Stress: Not on file  Relationships  . Social connections:    Talks on phone: Not on file    Gets together: Not on file    Attends religious service: Not on file    Active member of club or organization: Not on file    Attends meetings of clubs or organizations: Not on file    Relationship status: Not on file  . Intimate partner violence:    Fear of current or ex partner: Not on file    Emotionally abused: Not on file    Physically abused: Not on  file    Forced sexual activity: Not on file  Other Topics Concern  . Not on file  Social History Narrative   Lives in [place],  Former [employer],  Separated.      Family History  Problem Relation Age of Onset  . Anesthesia problems Mother   . Colon polyps Mother        beign  . Colon cancer Neg Hx    ROS General: Negative; No fevers, chills, or night sweats;  HEENT: Negative; No changes in vision or hearing, sinus congestion, difficulty swallowing Pulmonary: Negative; No cough, wheezing, shortness of breath, hemoptysis Cardiovascular:  See HPI GI: Positive for irritable bowel syndrome for which she takes Bentyl before meals;  GU: Negative; No dysuria, hematuria, or difficulty voiding Musculoskeletal: Positive for rheumatoid arthritis, and scoliosis. Hematologic/Oncology: Negative; no easy bruising, bleeding Endocrine: Negative; no heat/cold intolerance; no diabetes Neuro: Negative; no changes in balance, headaches Skin: Negative; No rashes or skin lesions Psychiatric: There is less stress and anxiety; No behavioral problems, depression Sleep: Negative; No snoring, daytime sleepiness, hypersomnolence, bruxism, restless legs, hypnogognic hallucinations, no cataplexy Other comprehensive 14 point system review is negative.   PE BP 108/69   Pulse 65   Ht '5\' 5"'  (1.651 m)   Wt 145 lb (65.8 kg)   BMI 24.13 kg/m  Repeat blood pressure by me was 98/60, which dropped to 90/60 with standing position.  Wt Readings from Last 3 Encounters:  10/23/17 145 lb (65.8 kg)  08/10/17 147 lb (66.7 kg)  06/22/17 143 lb (64.9 kg)   General: Alert, oriented, no distress.  Skin: normal turgor, no rashes, warm and dry HEENT: Normocephalic, atraumatic. Pupils equal round and reactive to light; sclera anicteric; extraocular muscles intact;  Nose without nasal septal hypertrophy Mouth/Parynx benign; Mallinpatti scale 2 Neck: No JVD, no carotid bruits; normal carotid  upstroke Lungs: clear to ausculatation and percussion; no wheezing or rales Chest wall: without tenderness to palpitation Heart: PMI not displaced, RRR, s1 s2 normal, 1/6 systolic murmur, no diastolic murmur, no rubs, gallops, thrills, or heaves Abdomen: soft, nontender; no hepatosplenomehaly, BS+; abdominal aorta nontender and not dilated by palpation. Back: no CVA tenderness Pulses 2+ Musculoskeletal: full range of motion, normal strength, no joint deformities Extremities: Rheumatoid arthritic changes to her hands.  no clubbing cyanosis , Homan's sign negative  Neurologic: grossly nonfocal; Cranial nerves grossly wnl Psychologic: Normal mood and affect   ECG (independently read by me): Atrially paced rhythm at 62 bpm with prolonged AV conduction.  PR interval '2 9 2 ' ms.  QTc interval '4 8 1 ' ms.  November 2018 ECG (independently read by me): atrially paced rhythm with prolonged AV conduction with PR interval at 264 ms.  PAC.  QTc interval increase that 503 ms.  May 2018 ECG (independently read by me): Atrially paced rhythm at 69 bpm.  Prolonged AV conduction with PR interval 266 milliseconds.  Increased QTc interval 497 ms.  August 2016 ECG (independently read by me): Atrially paced rhythm at 66 bpm.  PR interval 232 ms.  QTc interval normal at 448 ms.  ECG (independently read by me): Atrially paced rhythm at 64 bpm.  No AF.  QTC 435 ms.  December 2015 ECG (independently read by me): Atrially paced rhythm at 65 with prolonged AV conduction with a PR interval at 270 ms.  No significant ST segment changes.   June 2015 ECG (independently read by me): Atrially paced rhythm at 62 beats per minute with prolonged AV conduction with a PR interval of 244 ms.  QTc interval 444 ms.  Prior 09/22/2013 ECG (independently read by me): Atrial paced rhythm at 63 beats per minute with ventricular sensing.   LABS:  BMP Latest Ref Rng & Units 11/25/2016 01/22/2016 05/04/2015  Glucose 65 - 99 mg/dL 97 92 97   BUN 6 - 20 mg/dL '18 16 19  ' Creatinine 0.44 - 1.00 mg/dL 1.08(H) 1.13(H) 0.97  Sodium 135 - 145 mmol/L 131(L) 134(L) 132(L)  Potassium 3.5 - 5.1 mmol/L 4.2 4.1 4.9  Chloride 101 - 111 mmol/L 100(L) 100(L) 101  CO2 22 - 32 mmol/L '24 25 27  ' Calcium 8.9 - 10.3 mg/dL 9.8 9.7 8.7(L)    Hepatic Function Latest Ref Rng & Units 01/22/2016 12/21/2014 12/20/2014  Total Protein 6.5 - 8.1 g/dL 6.9 4.8(L) 6.0(L)  Albumin 3.5 - 5.0 g/dL 4.0 2.3(L) 3.0(L)  AST 15 - 41 U/L '26 19 25  ' ALT 14 - 54 U/L 14 13(L) 14  Alk Phosphatase 38 - 126 U/L 40 37(L) 47  Total Bilirubin 0.3 - 1.2 mg/dL 0.6 0.4 0.5  Bilirubin, Direct 0.0 - 0.3 mg/dL - - -    CBC Latest Ref Rng & Units 11/25/2016 10/14/2016 01/22/2016  WBC 4.0 - 10.5 K/uL 9.4 - 5.2  Hemoglobin 12.0 - 15.0 g/dL 11.3(L) 11.8(L) 11.8(L)  Hematocrit  36.0 - 46.0 % 34.8(L) - 36.6  Platelets 150 - 400 K/uL 220 - 239   Lab Results  Component Value Date   MCV 94.6 11/25/2016   MCV 96.6 01/22/2016   MCV 92.9 05/03/2015    Lab Results  Component Value Date   TSH 6.630 (H) 08/30/2014   Lab Results  Component Value Date   HGBA1C 5.3 04/01/2011    BNP    Component Value Date/Time   PROBNP 1,110.0 (H) 09/18/2013 0332    Lipid Panel     Component Value Date/Time   CHOL  12/19/2006 0315    172        ATP III CLASSIFICATION:  <200     mg/dL   Desirable  200-239  mg/dL   Borderline High  >=240    mg/dL   High   TRIG 293 (H) 12/19/2006 0315   HDL 37 (L) 12/19/2006 0315   CHOLHDL 4.6 12/19/2006 0315   VLDL 59 (H) 12/19/2006 0315   LDLCALC  12/19/2006 0315    76        Total Cholesterol/HDL:CHD Risk Coronary Heart Disease Risk Table                     Men   Women  1/2 Average Risk   3.4   3.3     RADIOLOGY: No results found.  IMPRESSION:  1. Paroxysmal atrial fibrillation (HCC)   2. Long term (current) use of anticoagulants   3. Pacemaker   4. Essential hypertension   5. Scoliosis, unspecified scoliosis type, unspecified spinal region    6. Rheumatoid arthritis, involving unspecified site, unspecified rheumatoid factor presence [facility])     ASSESSMENT AND PLAN: Ms. Rafeef Lau Is a 72 year old Caucasian female who has a history of PAF and is status post permanent pacemaker for sinus node dysfunction. She is anticoagulated and has a Cha2ds2vasc score of at least 3. Her echo Doppler study on 10/24/2013 showed normal systolic function with an ejection fraction of 60-65%.  There was grade 1 diastolic dysfunction.  There were no regional wall motion abnormalities.  The left atrium was normal in size .  Her most recent echo in May 2018 shows normal systolic and diastolic function, mild MR, mild LA dilation, and mild pulmonary hypertension.  In the past, her episodes of atrial fibrillation have seen to occur during periods of increased stress but she has experienced several episodes of recurrent AF which do not appear to be stress mediated.  She has seen [name] for consideration of ablation with her last evaluation in April 2016.  She has opted against ablation and has continued with medical therapy.  Most recent pacemaker interrogation revealed her last AF episode in July 2017.  Her blood pressure today is low on her medical regimen consisting of a atenolol 25 mg twice a day, diltiazem 120 g at bedtime, and losartan 25 mg.  She will discontinue losartan.  She has a prescription for furosemide to take as needed for swelling.  She is on levothyroxine for hypothyroidism.  She is now on eliquis for anticoagulation.  With normal procedures this will need to be held for 48 hours.  However, with neurosurgical procedures and spinal injection she will need to hold this for 4 days.  She is atrially paced with prolonged AV conduction.  She is unaware of any ectopy.  She will be seeing [name] in August for pacemaker follow-up.  She is being followed by Dr. Amil Amen for  her rheumatoid arthritis and Dr. Sherley Bounds for her potential neurosurgical  needs.  I will see her in 6 months for reevaluation.    Time spent: 25 minutes Troy Sine, MD, [facility]  10/25/2017 9:01 AM

## 2017-10-23 NOTE — Patient Instructions (Addendum)
Medication Instructions:  STOP losartan   Follow-Up: August with Dr. Royann Shivers  Your physician wants you to follow-up in: 6 months with Dr. [name].  You will receive a reminder letter in the mail two months in advance. If you don't receive a letter, please call our office to schedule the follow-up appointment.   Any Other Special Instructions Will Be Listed Below (If Applicable).     If you need a refill on your cardiac medications before your next appointment, please call your pharmacy.

## 2017-10-25 ENCOUNTER — Encounter: Payer: Self-pay | Admitting: Cardiovascular Disease

## 2017-10-26 NOTE — Addendum Note (Signed)
Addended by: Johney Frame A on: 10/26/2017 03:04 PM   Modules accepted: Orders

## 2017-10-30 ENCOUNTER — Other Ambulatory Visit: Payer: Self-pay | Admitting: Cardiovascular Disease

## 2017-12-02 ENCOUNTER — Other Ambulatory Visit: Payer: Self-pay | Admitting: Cardiovascular Disease

## 2017-12-02 NOTE — Telephone Encounter (Signed)
Rx request sent to pharmacy.  

## 2018-02-12 ENCOUNTER — Ambulatory Visit (INDEPENDENT_AMBULATORY_CARE_PROVIDER_SITE_OTHER): Payer: Medicare Other | Admitting: Cardiovascular Disease

## 2018-02-12 ENCOUNTER — Encounter: Payer: Self-pay | Admitting: Cardiovascular Disease

## 2018-02-12 VITALS — BP 103/58 | HR 67 | Ht 65.0 in | Wt 146.4 lb

## 2018-02-12 DIAGNOSIS — Z7901 Long term (current) use of anticoagulants: Secondary | ICD-10-CM | POA: Diagnosis not present

## 2018-02-12 DIAGNOSIS — I495 Sick sinus syndrome: Secondary | ICD-10-CM

## 2018-02-12 DIAGNOSIS — Z950 Presence of cardiac pacemaker: Secondary | ICD-10-CM

## 2018-02-12 DIAGNOSIS — I480 Paroxysmal atrial fibrillation: Secondary | ICD-10-CM | POA: Diagnosis not present

## 2018-02-12 DIAGNOSIS — I10 Essential (primary) hypertension: Secondary | ICD-10-CM

## 2018-02-12 NOTE — Patient Instructions (Signed)
Dr Croitoru recommends that you schedule a follow-up appointment in 6 months. You will receive a reminder letter in the mail two months in advance. If you don't receive a letter, please call our office to schedule the follow-up appointment.  If you need a refill on your cardiac medications before your next appointment, please call your pharmacy. 

## 2018-02-12 NOTE — Progress Notes (Signed)
Cardiology Office Note    Date:  02/14/2018   ID:  Jacqueline Kim, DOB May 18, 1946, MRN [id number]  PCP:  Georgianne Fick, MD  Cardiologist:  Nicki Guadalajara, MD; Thurmon Fair, MD   No chief complaint on file.   History of Present Illness:  Jacqueline Kim is a 72 y.o. female with paroxysmal atrial fibrillation and tachy-brady sd., here for pacemaker check.  She is done remarkably well with afternoon and has had no symptomatic events in well over her pacemaker interrogation shows a single episode of atrial fibrillation in the last 6 months, a 3-minute episode that occurred on the morning of May 13.  She feels well and remains physically active.  She denies falls, injuries, bleeding and is compliant with anticoagulation therapy.  She has not had any focal neurological events.  She has not taking her prn diuretic in a year.  She does occasionally complain of lack of energy.  She has not had any palpitations since her last office appointment.  Feels well and is very physically active.  No falls, neurological events or bleeding problems.  Medtronic Revo (2012) device is functioning normally. She has 96% atrial pacing due to sinus bradycardia, but no V pacing. Battery voltage is 2.96V (RRT 2.81V). Lead parameters remain excellent. Activity level is usually around 3 hours a day, although it has decreased over the last week or 2.  The heart rate histograms appear even more blunted than before.  Past Medical History:  Diagnosis Date  . Anticoagulant long-term use  WITH PRADAXA 05/27/2011  . Anxiety   . Cellulitis of buttock, left 12/20/2014  . Complication of anesthesia    DIFFICULTY WAKING UP  . Depression   . Diverticulosis   . DJD (degenerative joint disease)   . Esophagitis 09/17/06  . External hemorrhoids   . GERD (gastroesophageal reflux disease)   . Glaucoma   . History of stress test    a. 2008 - normal nuc.  Marland Kitchen Hypertension   . Hypothyroid   . IBS (irritable bowel syndrome)     . Paroxysmal A-fib [facility]) diagnosed 2008   a. On propafenone, Pradaxa.  . 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 BREAST REDUCTION SURGERY    . BUNIONECTOMY Right 07/09/2015   with rods and pins  . CARDIOVERSION N/A 08/02/2014   Procedure: CARDIOVERSION;  Surgeon: Quintella Reichert, MD;  Location: [facility] END[facility];  Service: Cardiovascular;  Laterality: N/A;  . CATARACT EXTRACTION, BILATERAL Bilateral   . FACIAL COSMETIC SURGERY    . FOOT SURGERY Left   . HAND SURGERY     multiple  . LIPOSUCTION    . NM MYOCAR PERF WALL MOTION  12/29/2006   No significant ischemia; EF 69%  . OVARIAN CYST REMOVAL    . PACEMAKER INSERTION  04/08/11   MDT Revo implanted by Dr Rubie Maid  . TONSILLECTOMY    . TOTAL KNEE ARTHROPLASTY Left 05/01/2015   Procedure: TOTAL LEFT KNEE ARTHROPLASTY;  Surgeon: Durene Romans, MD;  Location: [facility] ORS;  Service: Orthopedics;  Laterality: Left;  Marland Kitchen VAGINAL HYSTERECTOMY      Current Medications: Outpatient Medications Prior to Visit  Medication Sig Dispense Refill  . AMBULATORY NON FORMULARY MEDICATION Medication Name: Nitroglycerin ointment .0125% Pea size amount per rectum three times a day 30 g 3  . atenolol (TENORMIN) 25  MG tablet Take 1 tablet (25 mg total) by mouth 2 (two) times daily. 180 tablet 3  . Biotin 5000 MCG TABS Take 1 tablet by mouth daily.    . clindamycin (CLEOCIN) 300 MG capsule Take 1 capsule (300 mg total) by mouth 3 (three) times daily. 21 capsule 0  . dicyclomine (BENTYL) 10 MG capsule TAKE ONE CAPSULE 3 TIMES DAILY BEFORE MEALS 90 capsule 1  . diltiazem (CARDIZEM CD) 120 MG 24 hr capsule TAKE ONE CAPSULE BY MOUTH AT BEDTIME 90 capsule 3  . docusate sodium (COLACE) 100 MG capsule Take 1 capsule (100 mg total) by mouth 2 (two) times daily. 10 capsule 0  . ELIQUIS 5 MG TABS tablet TAKE ONE (1) TABLET  BY MOUTH TWO (2) TIMES DAILY 180 tablet 1  . escitalopram (LEXAPRO) 20 MG tablet Take 20 mg by mouth daily.     . furosemide (LASIX) 20 MG tablet Take 1 tablet (20 mg total) by mouth daily as needed for fluid. 30 tablet   . HYDROcodone-acetaminophen (NORCO/VICODIN) 5-325 MG tablet Take 1/2 to 1 tablet as needed for pain 15 tablet 0  . levothyroxine (SYNTHROID, LEVOTHROID) 112 MCG tablet Take 112 mcg by mouth daily before breakfast.     . linaclotide (LINZESS) 72 MCG capsule Take 1 capsule (72 mcg total) by mouth daily before breakfast. 30 capsule 3  . linaclotide (LINZESS) 72 MCG capsule Take 1 capsule (72 mcg total) by mouth daily before breakfast. 30 capsule 3  . metroNIDAZOLE (METROGEL) 1 % gel Apply topically.    . propafenone (RYTHMOL) 150 MG tablet TAKE 1 AND 1/2 TABLETS EVERY MORNING, 1 TABLET MID-DAY AND 1 AND 1/2 TABLETS EVERY EVENING 360 tablet 2  . traZODone (DESYREL) 150 MG tablet Take 75 mg by mouth at bedtime.     . triamcinolone cream (KENALOG) 0.1 % Apply to affected areas on face twice daily     Facility-Administered Medications Prior to Visit  Medication Dose Route Frequency Provider Last Rate Last Dose  . 0.9 %  sodium chloride infusion  500 mL Intravenous Continuous Nandigam, Kavitha V, MD         Allergies:   Azithromycin; Epinephrine; Penicillins; Lidocaine; and Metoprolol   Social History   Socioeconomic History  . Marital status: Legally Separated    Spouse name: Not on file  . Number of children: 3  . Years of education: Not on file  . Highest education level: Not on file  Occupational History  . Occupation: retired    Associate Professor: RETIRED  Social Needs  . Financial resource strain: Not on file  . Food insecurity:    Worry: Not on file    Inability: Not on file  . Transportation needs:    Medical: Not on file    Non-medical: Not on file  Tobacco Use  . Smoking status: Former Smoker    Last attempt to quit: 08/30/1996    Years since quitting: 21.4  .  Smokeless tobacco: Never Used  Substance and Sexual Activity  . Alcohol use: Yes    Alcohol/week: 6.0 standard drinks    Types: 6 Glasses of wine per week    Comment: daily  . Drug use: No  . Sexual activity: Yes  Lifestyle  . Physical activity:    Days per week: Not on file    Minutes per session: Not on file  . Stress: Not on file  Relationships  . Social connections:    Talks on phone: Not on file  Gets together: Not on file    Attends religious service: Not on file    Active member of club or organization: Not on file    Attends meetings of clubs or organizations: Not on file    Relationship status: Not on file  Other Topics Concern  . Not on file  Social History Narrative   Lives in [place],  Former [employer],  Separated.       Family History:  The patient's family history includes Anesthesia problems in her mother; Colon polyps in her mother.   ROS:   Please see the history of present illness.    ROS or other systems are reviewed and are negative   PHYSICAL EXAM:   VS:  BP (!) 103/58   Pulse 67   Ht 5\' 5"  (1.651 m)   Wt 146 lb 6.4 oz (66.4 kg)   BMI 24.36 kg/m     General: Alert, oriented x3, no distress, appears lean and fit Head: no evidence of trauma, PERRL, EOMI, no exophtalmos or lid lag, no myxedema, no xanthelasma; normal ears, nose and oropharynx Neck: normal jugular venous pulsations and no hepatojugular reflux; brisk carotid pulses without delay and no carotid bruits Chest: clear to auscultation, no signs of consolidation by percussion or palpation, normal fremitus, symmetrical and full respiratory excursions Cardiovascular: normal position and quality of the apical impulse, regular rhythm, normal first and second heart sounds, no murmurs, rubs or gallops.  Healthy pacemaker site Abdomen: no tenderness or distention, no masses by palpation, no abnormal pulsatility or arterial bruits, normal bowel sounds, no  hepatosplenomegaly Extremities: no clubbing, cyanosis or edema; 2+ radial, ulnar and brachial pulses bilaterally; 2+ right femoral, posterior tibial and dorsalis pedis pulses; 2+ left femoral, posterior tibial and dorsalis pedis pulses; no subclavian or femoral bruits Neurological: grossly nonfocal Psych: Normal mood and affect   Wt Readings from Last 3 Encounters:  02/12/18 146 lb 6.4 oz (66.4 kg)  10/23/17 145 lb (65.8 kg)  08/10/17 147 lb (66.7 kg)      Studies/Labs Reviewed:   EKG:  EKG is not ordered today.    Recent Labs: No results found for requested labs within last 8760 hours.   Lipid Panel    Component Value Date/Time   CHOL  12/19/2006 0315    172        ATP III CLASSIFICATION:  <200     mg/dL   Desirable  532-023  mg/dL   Borderline High  >=343    mg/dL   High   TRIG 568 (H) 61/68/3729 0315   HDL 37 (L) 12/19/2006 0315   CHOLHDL 4.6 12/19/2006 0315   VLDL 59 (H) 12/19/2006 0315   LDLCALC  12/19/2006 0315    76        Total Cholesterol/HDL:CHD Risk Coronary Heart Disease Risk Table                     Men   Women  1/2 Average Risk   3.4   3.3    Additional studies/ records that were reviewed today include:  Notes from Daphene Jaeger Oct 23, 2017  ASSESSMENT:    1. Paroxysmal atrial fibrillation (HCC)   2. Long term current use of anticoagulant   3. SSS (sick sinus syndrome) (HCC)   4. Pacemaker   5. Essential hypertension      PLAN:  In order of problems listed above:  1. AFib: Only one 3-minute episode in the last 6 months,  excellent response to propafenone. CHADSVasc 3. 2. Eliquis: Compliant with therapy and no complications. 3. SSS: Increase the rate response slope from 7-8 in the upper sensor rate from 115 220 bpm to see if we can improve response to exercise. 4. PPM: Remote downloads make her very anxious.  She would rather not do these.  Plan office checks every 6 months. 5. HTN: Excellent blood pressure control  Medication Adjustments/Labs  and Tests Ordered: Current medicines are reviewed at length with the patient today.  Concerns regarding medicines are outlined above.  Medication changes, Labs and Tests ordered today are listed in the Patient Instructions below. Patient Instructions  Dr Royann Shivers recommends that you schedule a follow-up appointment in 6 months. You will receive a reminder letter in the mail two months in advance. If you don't receive a letter, please call our office to schedule the follow-up appointment.  If you need a refill on your cardiac medications before your next appointment, please call your pharmacy.    Signed, Thurmon Fair, MD  02/14/2018 3:05 PM    [facility] [address] [place], [place], [place]  [address] Phone: [phone]; Fax: [phone]

## 2018-02-14 ENCOUNTER — Encounter: Payer: Self-pay | Admitting: Cardiovascular Disease

## 2018-03-26 ENCOUNTER — Ambulatory Visit (INDEPENDENT_AMBULATORY_CARE_PROVIDER_SITE_OTHER): Payer: Medicare Other | Admitting: Cardiovascular Disease

## 2018-03-26 ENCOUNTER — Encounter: Payer: Self-pay | Admitting: Cardiovascular Disease

## 2018-03-26 VITALS — BP 98/60 | HR 64 | Ht 65.0 in | Wt 141.0 lb

## 2018-03-26 DIAGNOSIS — E039 Hypothyroidism, unspecified: Secondary | ICD-10-CM

## 2018-03-26 DIAGNOSIS — R0681 Apnea, not elsewhere classified: Secondary | ICD-10-CM

## 2018-03-26 DIAGNOSIS — Z7901 Long term (current) use of anticoagulants: Secondary | ICD-10-CM

## 2018-03-26 DIAGNOSIS — I495 Sick sinus syndrome: Secondary | ICD-10-CM | POA: Diagnosis not present

## 2018-03-26 DIAGNOSIS — I480 Paroxysmal atrial fibrillation: Secondary | ICD-10-CM

## 2018-03-26 DIAGNOSIS — Z950 Presence of cardiac pacemaker: Secondary | ICD-10-CM | POA: Diagnosis not present

## 2018-03-26 DIAGNOSIS — R0683 Snoring: Secondary | ICD-10-CM

## 2018-03-26 DIAGNOSIS — I951 Orthostatic hypotension: Secondary | ICD-10-CM

## 2018-03-26 DIAGNOSIS — I10 Essential (primary) hypertension: Secondary | ICD-10-CM

## 2018-03-26 MED ORDER — ATENOLOL 25 MG PO TABS: 12.5000 mg | ORAL_TABLET | Freq: Two times a day (BID) | ORAL | 3 refills | Status: DC

## 2018-03-26 NOTE — Patient Instructions (Signed)
Medication Instructions:  DECREASE atenolol to 12.5 mg two times daily DECREASE lexapro to 10 mg daily  If you need a refill on your cardiac medications before your next appointment, please call your pharmacy.   Testing/Procedures: Your physician has recommended that you have a sleep study. This test records several body functions during sleep, including: brain activity, eye movement, oxygen and carbon dioxide blood levels, heart rate and rhythm, breathing rate and rhythm, the flow of air through your mouth and nose, snoring, body muscle movements, and chest and belly movement. --once approved by insurance, we will call to schedule this   Follow-Up: At [facility], you and your health needs are our priority.  As part of our continuing mission to provide you with exceptional heart care, we have created designated Provider Care Teams.  These Care Teams include your primary Cardiologist (physician) and Advanced Practice Providers (APPs -  Physician Assistants and Nurse Practitioners) who all work together to provide you with the care you need, when you need it. You will need a follow up appointment in 3 months.  Please call our office 2 months in advance to schedule this appointment.  You may see Dr. [name] or one of the following Advanced Practice Providers on your designated Care Team: [place], [place] . Micah Flesher, PA-C

## 2018-03-26 NOTE — Progress Notes (Signed)
Patient ID: Jacqueline Kim, female   DOB: 1945/09/02, 72 y.o.   MRN: [id number]     HPI: Jacqueline Kim is a 72 y.o. female  who presents for a 5 month follow-up cardiologic evaluation.  Jacqueline Kim has known history of sinus node dysfunction. She has history of paroxysmal atrial fibrillation. She is status post permanent pacemaker implanted on 04/08/2011 Medtronic Revo MRI compatible pacemaker. She also has a history of hypothyroidism. She has had recurrent episodes of paroxysmal atrial fibrillation and has been on pradaxa anticoagulation for several years after initially having been on warfarin.  She has been maintained on rhythmol SR 325 mg twice a day and atenolol 50 mg daily.  She also is on losartan 50 mg for hypertension.  She was hospitalized in January 2014 with recurrent AF in the setting of significant increased family stress. She is now separated and living in a different household from her husband [name].  She has had several episodes of recurrent AF and had an episode on 03/12/2013 which lasted for approximately 12 hours.  On the Saturday before Easter 2015 when she was rushing with family members and trying to prepare dinner she was hospitalized and presented with a heart rate at 150.  She was started on IV Cardizem for rate control.  She spontaneously cardioverted to sinus rhythm on April 6 prior to a planned TEE cardioversion.  I saw her in followup of that hospitalization and suggested an EP evaluation.  She saw [name] for discussion concerning atrial fibrillation ablation.  Apparently, at that time, the patient was feeling well. She opted against pursuing AF ablation presently.    A followup echo Doppler study on 10/24/2013 showed an ejection fraction of 60-65% and there was evidence for grade 1 diastolic function without wall motion abnormalities.  Her left atrial dimension was normal.    She is followed by [name] for her pacemaker.  She was hospitalized in early  February 2016 following a prolonged episode of atrial fibrillation with rapid ventricular response for which she was symptomatic.  She underwent her initial cardioversion on 07/27/2014.  When she saw [name] on August 08, 2014 interrogation of her pacemaker showed a 28 hour episode of atrial fibrillation without recurrence since the cardioversion.  She again developed a recurrent episode of atrial fibrillation on 08/30/2014 which led to an emergency room evaluation.  I last saw her in March.  I recommended follow-up evaluation with [name] and reconsideration of possible atrial fibrillation ablation.  She saw [name] at that time admitted that she was drinking wine on a daily basis.  Rather than pursue ablation she elected to reduce her EtOH intake.  She feels that she has done well and has felt one episode of palpitations during her period of increased stress that she is aware of since her last evaluation..  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  She denies exertional chest pain, shortness of breath, orthopnea, PND, presyncope or syncope or any neurologic sequelae.  When she met with [name].  There was no discussion of changing her anti-rhythmic therapy.    She underwent left knee surgery by [name].  She also fell on December 20, 2014 in the shower and was hospitalized for 2 days.  She developed a cellulitis and buttock abscess, which required anabiotic therapy.    She  lives at [facility] in a second floor unit.  She denies any episodes of chest pain.  At times she notes some shortness of breath with activity.  She saw [name] in October 2017 and her pacemaker was functioning normally.  She did not have any recurrent atrial fibrillation and had only 2 brief episodes of PAT which combined last a total of 54 seconds.  She states she had stopped taking the losartan, which had been 50 mg because that time, she noted some lightheadedness  unassociated with any rhythm disturbance.  In August 2017, a CT of her abdomen was performed which did not explain some of her issues at the time of rectal bleeding and constipation.  She was found to have mild colonic diverticulosis without diverticulitis.  Incidentally, she was noted to have abdominal aortic atherosclerosis.    When I  saw her in 2018, she was hypertensive and I recommended reinstitution of losartan at least 25 mg depending upon her blood pressure, but if necessary to increase to 25 mg 3 times a day.  Since I last saw her in May 2018, she underwent an echo Doppler study which showed an EF of 55-60%.  She had normal diastolic parameters.  There was mild mitral regurgitation, mild left atrial dilation, and mild pulmonary hypertension with a PA peak pressure 38 mm.  She saw [name] on 11/13/2016 and her losartan dose was reduced to 25 mg daily.  She sees Dr. Naida Sleight for rheumatoid arthritis.  She is status post left knee replacement and tolerated this well.  Her primary physician is [name].    She has had issues with scoliosis and in the future may require possible surgery.  At times she has noticed some dragging of her left leg.  She is unaware of any recurrent episodes of atrial fibrillation.  Her last echo Doppler study in May 2018 showed an EF of 55-60%.  She had mild mitral regurgitation.  There was mild pulmonary hypertension with a PA pressure 38.  She had normal diastolic parameters.   I last saw her, she saw [name] on February 12, 2018.  At that time, interrogation of her pacemaker revealed one 3-minute episode in the last 6 months of recurrent PAF occurred on Oct 28, 2017.  However, she comes to the office today and states that last week sleeping she was awakened with a chest fluttering and was concerned that she was back in atrial fibrillation for questionable duration.  She is here with her daughters in the office.  She recently has had issues with dizziness and has  fallen on a couple occasions.  She continues to be on atenolol 25 mg twice a day, Lia Hopping known 150 mg tablets which she takes 1-1/2 in the morning, 1 at midday, 1-1/2 in the evening.  She also is on Cardizem CD 120 mg at bedtime.  She is on Eliquis for anticoagulation.  She denies any recent bruises particularly following her fall.  She is on levothyroxine for hypothyroidism.  She has a prescription for furosemide which he takes on a as needed basis for leg edema but has rarely taking this.  According to family members, she has been noted to snore significantly.  The patient states that oftentimes her dog wakes her up in the middle of the night patient believes she may this occurs because she may be having some episodes of gasping for breath.  She presents for reevaluation.  Past Medical History:  Diagnosis Date  . Anticoagulant long-term use  WITH PRADAXA 05/27/2011  . Anxiety   .  Cellulitis of buttock, left 12/20/2014  . Complication of anesthesia    DIFFICULTY WAKING UP  . Depression   . Diverticulosis   . DJD (degenerative joint disease)   . Esophagitis 09/17/06  . External hemorrhoids   . GERD (gastroesophageal reflux disease)   . Glaucoma   . History of stress test    a. 2008 - normal nuc.  Marland Kitchen Hypertension   . Hypothyroid   . IBS (irritable bowel syndrome)   . Paroxysmal A-fib [facility]) diagnosed 2008   a. On propafenone, Pradaxa.  . 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 BREAST REDUCTION SURGERY    . BUNIONECTOMY Right 07/09/2015   with rods and pins  . CARDIOVERSION N/A 08/02/2014   Procedure: CARDIOVERSION;  Surgeon: Sueanne Margarita, MD;  Location: [facility];  Service: Cardiovascular;  Laterality: N/A;  . CATARACT EXTRACTION, BILATERAL Bilateral   . FACIAL COSMETIC SURGERY    . FOOT SURGERY Left   . HAND SURGERY      multiple  . LIPOSUCTION    . NM MYOCAR PERF WALL MOTION  12/29/2006   No significant ischemia; EF 69%  . OVARIAN CYST REMOVAL    . PACEMAKER INSERTION  04/08/11   MDT Revo implanted by Dr Recardo Evangelist  . TONSILLECTOMY    . TOTAL KNEE ARTHROPLASTY Left 05/01/2015   Procedure: TOTAL LEFT KNEE ARTHROPLASTY;  Surgeon: Paralee Cancel, MD;  Location: [facility];  Service: Orthopedics;  Laterality: Left;  Marland Kitchen VAGINAL HYSTERECTOMY      Allergies  Allergen Reactions  . Azithromycin Other (See Comments)    Causes heart rhythm issues  . Epinephrine Other (See Comments)    Causes A-Fib  . Penicillins Anaphylaxis    Has patient had a PCN reaction causing immediate rash, facial/tongue/throat swelling, SOB or lightheadedness with hypotension: Yes Has patient had a PCN reaction causing severe rash involving mucus membranes or skin necrosis: No Has patient had a PCN reaction that required hospitalization No Has patient had a PCN reaction occurring within the last 10 years: No If all of the above answers are "NO", then may proceed with Cephalosporin use.   . Lidocaine Palpitations    Or any similar medication  . Metoprolol Other (See Comments)    alopecia    Current Outpatient Medications  Medication Sig Dispense Refill  . AMBULATORY NON FORMULARY MEDICATION Medication Name: Nitroglycerin ointment .0125% Pea size amount per rectum three times a day 30 g 3  . atenolol (TENORMIN) 25 MG tablet Take 0.5 tablets (12.5 mg total) by mouth 2 (two) times daily. 90 tablet 3  . Biotin 5000 MCG TABS Take 1 tablet by mouth daily.    . clindamycin (CLEOCIN) 300 MG capsule Take 1 capsule (300 mg total) by mouth 3 (three) times daily. 21 capsule 0  . dicyclomine (BENTYL) 10 MG capsule TAKE ONE CAPSULE 3 TIMES DAILY BEFORE MEALS 90 capsule 1  . diltiazem (CARDIZEM CD) 120 MG 24 hr capsule TAKE ONE CAPSULE BY MOUTH AT BEDTIME 90 capsule 3  . docusate sodium (COLACE) 100 MG capsule Take 1 capsule (100 mg total) by mouth 2  (two) times daily. 10 capsule 0  . ELIQUIS 5 MG TABS tablet TAKE ONE (1) TABLET BY MOUTH TWO (2) TIMES DAILY 180 tablet 1  . escitalopram (LEXAPRO) 20 MG tablet Take 10 mg by mouth  daily.    . furosemide (LASIX) 20 MG tablet Take 1 tablet (20 mg total) by mouth daily as needed for fluid. 30 tablet   . HYDROcodone-acetaminophen (NORCO/VICODIN) 5-325 MG tablet Take 1/2 to 1 tablet as needed for pain 15 tablet 0  . levothyroxine (SYNTHROID, LEVOTHROID) 112 MCG tablet Take 112 mcg by mouth daily before breakfast.     . linaclotide (LINZESS) 72 MCG capsule Take 1 capsule (72 mcg total) by mouth daily before breakfast. 30 capsule 3  . linaclotide (LINZESS) 72 MCG capsule Take 1 capsule (72 mcg total) by mouth daily before breakfast. 30 capsule 3  . metroNIDAZOLE (METROGEL) 1 % gel Apply topically.    . propafenone (RYTHMOL) 150 MG tablet TAKE 1 AND 1/2 TABLETS EVERY MORNING, 1 TABLET MID-DAY AND 1 AND 1/2 TABLETS EVERY EVENING 360 tablet 2  . traZODone (DESYREL) 150 MG tablet Take 75 mg by mouth at bedtime.     . triamcinolone cream (KENALOG) 0.1 % Apply to affected areas on face twice daily     Current Facility-Administered Medications  Medication Dose Route Frequency Provider Last Rate Last Dose  . 0.9 %  sodium chloride infusion  500 mL Intravenous Continuous Nandigam, Venia Minks, MD        Social History   Socioeconomic History  . Marital status: Legally Separated    Spouse name: Not on file  . Number of children: 3  . Years of education: Not on file  . Highest education level: Not on file  Occupational History  . Occupation: retired    Fish farm manager: RETIRED  Social Needs  . Financial resource strain: Not on file  . Food insecurity:    Worry: Not on file    Inability: Not on file  . Transportation needs:    Medical: Not on file    Non-medical: Not on file  Tobacco Use  . Smoking status: Former Smoker    Last attempt to quit: 08/30/1996    Years since quitting: 21.5  . Smokeless  tobacco: Never Used  Substance and Sexual Activity  . Alcohol use: Yes    Alcohol/week: 6.0 standard drinks    Types: 6 Glasses of wine per week    Comment: daily  . Drug use: No  . Sexual activity: Yes  Lifestyle  . Physical activity:    Days per week: Not on file    Minutes per session: Not on file  . Stress: Not on file  Relationships  . Social connections:    Talks on phone: Not on file    Gets together: Not on file    Attends religious service: Not on file    Active member of club or organization: Not on file    Attends meetings of clubs or organizations: Not on file    Relationship status: Not on file  . Intimate partner violence:    Fear of current or ex partner: Not on file    Emotionally abused: Not on file    Physically abused: Not on file    Forced sexual activity: Not on file  Other Topics Concern  . Not on file  Social History Narrative   Lives in [place],  Former [place] [employer],  Separated.      Family History  Problem Relation Age of Onset  . Anesthesia problems Mother   . Colon polyps Mother        beign  . Colon cancer Neg Hx    ROS General: Negative; No fevers, chills, or  night sweats;  HEENT: Negative; No changes in vision or hearing, sinus congestion, difficulty swallowing Pulmonary: Negative; No cough, wheezing, shortness of breath, hemoptysis Cardiovascular:  See HPI GI: Positive for irritable bowel syndrome for which she takes Bentyl before meals;  GU: Negative; No dysuria, hematuria, or difficulty voiding Musculoskeletal: Positive for rheumatoid arthritis, and scoliosis. Hematologic/Oncology: Negative; no easy bruising, bleeding Endocrine: Negative; no heat/cold intolerance; no diabetes Neuro: Negative; no changes in balance, headaches Skin: Negative; No rashes or skin lesions Psychiatric: There is less stress and anxiety; No behavioral problems, depression Sleep: Negative; No snoring, daytime sleepiness, hypersomnolence, bruxism,  restless legs, hypnogognic hallucinations, no cataplexy Other comprehensive 14 point system review is negative.   PE BP 98/60   Pulse 64   Ht '5\' 5"'  (1.651 m)   Wt 141 lb (64 kg)   BMI 23.46 kg/m    Repeat blood pressure by me was 114/70 supine and her blood pressure dropped to 94/64 standing.  Wt Readings from Last 3 Encounters:  03/26/18 141 lb (64 kg)  02/12/18 146 lb 6.4 oz (66.4 kg)  10/23/17 145 lb (65.8 kg)   General: Alert, oriented, no distress.  Skin: normal turgor, no rashes, warm and dry HEENT: Normocephalic, atraumatic. Pupils equal round and reactive to light; sclera anicteric; extraocular muscles intact;  Nose without nasal septal hypertrophy Mouth/Parynx benign; Mallinpatti scale 3 Neck: No JVD, no carotid bruits; normal carotid upstroke Lungs: clear to ausculatation and percussion; no wheezing or rales Chest wall: without tenderness to palpitation Heart: PMI not displaced, RRR, s1 s2 normal, 1/6 systolic murmur, no diastolic murmur, no rubs, gallops, thrills, or heaves Abdomen: soft, nontender; no hepatosplenomehaly, BS+; abdominal aorta nontender and not dilated by palpation. Back: no CVA tenderness Pulses 2+ Musculoskeletal: full range of motion, normal strength, no joint deformities Extremities: no clubbing cyanosis or edema, Homan's sign negative  Neurologic: grossly nonfocal; Cranial nerves grossly wnl Psychologic: Normal mood and affect  ECG (independently read by me): Telemetry paced rhythm with prolonged AV conduction with PR interval 264 ms.  Ventricular rate 64 bpm.  QTc interval 478 ms.  May 2019 ECG (independently read by me): Atrially paced rhythm at 62 bpm with prolonged AV conduction.  PR interval '2 9 2 ' ms.  QTc interval '4 8 1 ' ms.  November 2018 ECG (independently read by me): atrially paced rhythm with prolonged AV conduction with PR interval at 264 ms.  PAC.  QTc interval increase that 503 ms.  May 2018 ECG (independently read by me):  Atrially paced rhythm at 69 bpm.  Prolonged AV conduction with PR interval 266 milliseconds.  Increased QTc interval 497 ms.  August 2016 ECG (independently read by me): Atrially paced rhythm at 66 bpm.  PR interval 232 ms.  QTc interval normal at 448 ms.  ECG (independently read by me): Atrially paced rhythm at 64 bpm.  No AF.  QTC 435 ms.  December 2015 ECG (independently read by me): Atrially paced rhythm at 65 with prolonged AV conduction with a PR interval at 270 ms.  No significant ST segment changes.   June 2015 ECG (independently read by me): Atrially paced rhythm at 62 beats per minute with prolonged AV conduction with a PR interval of 244 ms.  QTc interval 444 ms.  Prior 09/22/2013 ECG (independently read by me): Atrial paced rhythm at 63 beats per minute with ventricular sensing.   LABS:  BMP Latest Ref Rng & Units 11/25/2016 01/22/2016 05/04/2015  Glucose 65 - 99 mg/dL 97 92 97  BUN 6 - 20 mg/dL '18 16 19  ' Creatinine 0.44 - 1.00 mg/dL 1.08(H) 1.13(H) 0.97  Sodium 135 - 145 mmol/L 131(L) 134(L) 132(L)  Potassium 3.5 - 5.1 mmol/L 4.2 4.1 4.9  Chloride 101 - 111 mmol/L 100(L) 100(L) 101  CO2 22 - 32 mmol/L '24 25 27  ' Calcium 8.9 - 10.3 mg/dL 9.8 9.7 8.7(L)    Hepatic Function Latest Ref Rng & Units 01/22/2016 12/21/2014 12/20/2014  Total Protein 6.5 - 8.1 g/dL 6.9 4.8(L) 6.0(L)  Albumin 3.5 - 5.0 g/dL 4.0 2.3(L) 3.0(L)  AST 15 - 41 U/L '26 19 25  ' ALT 14 - 54 U/L 14 13(L) 14  Alk Phosphatase 38 - 126 U/L 40 37(L) 47  Total Bilirubin 0.3 - 1.2 mg/dL 0.6 0.4 0.5  Bilirubin, Direct 0.0 - 0.3 mg/dL - - -    CBC Latest Ref Rng & Units 11/25/2016 10/14/2016 01/22/2016  WBC 4.0 - 10.5 K/uL 9.4 - 5.2  Hemoglobin 12.0 - 15.0 g/dL 11.3(L) 11.8(L) 11.8(L)  Hematocrit 36.0 - 46.0 % 34.8(L) - 36.6  Platelets 150 - 400 K/uL 220 - 239   Lab Results  Component Value Date   MCV 94.6 11/25/2016   MCV 96.6 01/22/2016   MCV 92.9 05/03/2015    Lab Results  Component Value Date   TSH 6.630 (H)  08/30/2014   Lab Results  Component Value Date   HGBA1C 5.3 04/01/2011    BNP    Component Value Date/Time   PROBNP 1,110.0 (H) 09/18/2013 0332    Lipid Panel     Component Value Date/Time   CHOL  12/19/2006 0315    172        ATP III CLASSIFICATION:  <200     mg/dL   Desirable  200-239  mg/dL   Borderline High  >=240    mg/dL   High   TRIG 293 (H) 12/19/2006 0315   HDL 37 (L) 12/19/2006 0315   CHOLHDL 4.6 12/19/2006 0315   VLDL 59 (H) 12/19/2006 0315   LDLCALC  12/19/2006 0315    76        Total Cholesterol/HDL:CHD Risk Coronary Heart Disease Risk Table                     Men   Women  1/2 Average Risk   3.4   3.3     RADIOLOGY: No results found.  IMPRESSION:  1. Paroxysmal atrial fibrillation (HCC)   2. Pacemaker   3. SSS (sick sinus syndrome) ([place])   4. Witnessed episode of apnea   5. Snoring   6. Orthostatic hypotension   7. Essential hypertension   8. Hypothyroidism, unspecified type   9. Long term (current) use of anticoagulants     ASSESSMENT AND PLAN: Ms. Jacqueline Kim Is a 72 year old Caucasian female who has a history of PAF and is status post permanent pacemaker for sinus node dysfunction. She is anticoagulated and has a Cha2ds2vasc score of at least 3. An echo Doppler study on 10/24/2013 showed normal systolic function with an ejection fraction of 60-65%.  There was grade 1 diastolic dysfunction.  There were no regional wall motion abnormalities.  The left atrium was normal in size .  Her most recent echo in May 2018 shows normal systolic and diastolic function, mild MR, mild LA dilation, and mild pulmonary hypertension.  In the past, her episodes of atrial fibrillation have seen to occur during periods of increased stress but she has experienced several episodes of  recurrent AF which do not appear to be stress mediated.  She has seen [name] for consideration of ablation with her last evaluation in April 2016.  She has opted against ablation  and has continued with medical therapy.  Her most recent pacemaker evaluation in August revealed one isolated episode of atrial fibrillation lasting 3 minutes which occurred in May.  Patient states she had experienced another episode of probable recurrent atrial fibrillation which awakened her from sleep last week.  She also has a history that is suggestive of possible obstructive sleep apnea and her most recent episode may have occurred during nocturnal hypoxemia due to by sleep disordered breathing.  On exam today she is orthostatic with reference to blood pressure drop and she has noticed some episodes of dizziness associated with this.  She is 100% atrially paced.  I have recommended she decrease her atenolol from 25 mg twice a day down to 12.5 mg.  I have also suggested that she try reducing her Lexapro which she has been on for anxiety and has been taking 20 mg daily.  Suggested that she try reducing this down to 10 mg.  I strongly recommended compression stockings with 20 to 30 mm pressure support.  I am scheduling her for sleep study to assess for potential obstructive sleep apnea.  She will monitor her blood pressure as well as recurrent episodes of dizziness.  As long as she remains stable I will see her in 2 to 3 months for evaluation but will see her sooner if recurrent symptoms develop  Time spent: 30 minutes Troy Sine, MD, [facility]  03/27/2018 2:45 PM

## 2018-03-27 ENCOUNTER — Encounter: Payer: Self-pay | Admitting: Cardiovascular Disease

## 2018-03-30 ENCOUNTER — Telehealth: Payer: Self-pay | Admitting: *Deleted

## 2018-03-30 NOTE — Telephone Encounter (Signed)
-----   Message from Leotis Pain sent at 03/26/2018 12:20 PM EDT ----- Regarding: Sleep Study Ordered by Dr. [name]

## 2018-03-30 NOTE — Telephone Encounter (Signed)
Patient notified of sleep study appointment scheduled on 05/08/18 @ [facility].

## 2018-04-14 ENCOUNTER — Ambulatory Visit: Payer: Medicare Other | Admitting: Gastroenterology

## 2018-04-14 ENCOUNTER — Encounter

## 2018-04-26 ENCOUNTER — Ambulatory Visit: Payer: Medicare Other | Admitting: Cardiovascular Disease

## 2018-04-29 ENCOUNTER — Ambulatory Visit (INDEPENDENT_AMBULATORY_CARE_PROVIDER_SITE_OTHER): Payer: Medicare Other | Admitting: Physician Assistant

## 2018-04-29 ENCOUNTER — Encounter: Payer: Self-pay | Admitting: Physician Assistant

## 2018-04-29 VITALS — BP 102/64 | HR 61 | Ht 65.0 in | Wt 139.8 lb

## 2018-04-29 DIAGNOSIS — Z950 Presence of cardiac pacemaker: Secondary | ICD-10-CM | POA: Diagnosis not present

## 2018-04-29 DIAGNOSIS — I952 Hypotension due to drugs: Secondary | ICD-10-CM

## 2018-04-29 DIAGNOSIS — E039 Hypothyroidism, unspecified: Secondary | ICD-10-CM | POA: Diagnosis not present

## 2018-04-29 DIAGNOSIS — I480 Paroxysmal atrial fibrillation: Secondary | ICD-10-CM

## 2018-04-29 MED ORDER — ATENOLOL 25 MG PO TABS: ORAL_TABLET | ORAL | 1 refills | Status: DC

## 2018-04-29 NOTE — Progress Notes (Signed)
Cardiology Office Note    Date:  05/01/2018   ID:  ADRYAN DRUCKENMILLER, DOB [birth year], MRN [id number]  PCP:  Georgianne Fick, MD  Cardiologist: Dr. [name] Pacemaker check: Dr. Royann Shivers  Chief Complaint  Patient presents with  . Follow-up    seen for Dr. [name]    History of Present Illness:  Jacqueline Kim is a 72 y.o. female with PMH o paroxysmal atrial fibrillation, sinus node dysfunction s/p Medtronic MRI compatible pacemaker in December 2012, hypothyroidism, and hypertension.  Patient was previously placed on Coumadin, then later transition to Pradaxa and eventually Eliquis.  Her atrial fibrillation is quite well controlled on atenolol and Rythmol.  She had a recurrent episode of A. fib in September 2014 which lasted for 12 hours.  She is saw Dr. already for discussion regarding atrial fibrillation ablation, however because she was feeling well at the time, she opted against pursuing ablation.  Echocardiogram obtained in May 2015 showed EF 60 to 65%, grade 1 DD, normal left atrial dimension.  She had another episode of hospitalization in February 2016 for A. Fib.  She underwent cardioversion in February 2016.  Last echocardiogram obtained on 11/13/2016 showed EF 55 to 60%, mild MR, mild LAE, peak PA pressure 38 mmHg.  She was recently seen by Dr. [name] October 2019, she was scheduled a split-night sleep study for obstructive sleep apnea.  She was also recommended to start on compression stocking.  Patient presents today for follow-up.  For the past 4 days, her daughter has taken her off of atenolol due to low blood pressure with systolic blood pressure in the 90s.  She gets quite dizzy especially with changing the body positions due to orthostatic dizziness.  She has not started on the compression stockings yet.  I recommend her to start on the compression stocking.  She will need a CBC to rule out anemia, she says she has a follow-up with her PCPs this month.  Her blood pressure today off of  atenolol is borderline.  The only blood pressure medication she is currently taking is the diltiazem CD 120 mg daily.  I will continue her on the diltiazem and change to atenolol on as-needed basis for breakthrough atrial fibrillation.  Past Medical History:  Diagnosis Date  . Anticoagulant long-term use  WITH PRADAXA 05/27/2011  . Anxiety   . Cellulitis of buttock, left 12/20/2014  . Complication of anesthesia    DIFFICULTY WAKING UP  . Depression   . Diverticulosis   . DJD (degenerative joint disease)   . Esophagitis 09/17/06  . External hemorrhoids   . GERD (gastroesophageal reflux disease)   . Glaucoma   . History of stress test    a. 2008 - normal nuc.  Marland Kitchen Hypertension   . Hypothyroid   . IBS (irritable bowel syndrome)   . Paroxysmal A-fib [facility]) diagnosed 2008   a. On propafenone, Pradaxa.  . 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 BREAST REDUCTION SURGERY    . BUNIONECTOMY Right 07/09/2015   with rods and pins  . CARDIOVERSION N/A 08/02/2014   Procedure: CARDIOVERSION;  Surgeon: Quintella Reichert, MD;  Location: [facility] ENDOSCOPY;  Service: Cardiovascular;  Laterality: N/A;  . CATARACT EXTRACTION, BILATERAL Bilateral   . FACIAL COSMETIC SURGERY    . FOOT SURGERY Left   . HAND SURGERY  multiple  . LIPOSUCTION    . NM MYOCAR PERF WALL MOTION  12/29/2006   No significant ischemia; EF 69%  . OVARIAN CYST REMOVAL    . PACEMAKER INSERTION  04/08/11   MDT Revo implanted by Dr Rubie Maid  . TONSILLECTOMY    . TOTAL KNEE ARTHROPLASTY Left 05/01/2015   Procedure: TOTAL LEFT KNEE ARTHROPLASTY;  Surgeon: Durene Romans, MD;  Location: [facility] ORS;  Service: Orthopedics;  Laterality: Left;  Marland Kitchen VAGINAL HYSTERECTOMY      Current Medications: Outpatient Medications Prior to Visit  Medication Sig Dispense Refill  . AMBULATORY NON FORMULARY  MEDICATION Medication Name: Nitroglycerin ointment .0125% Pea size amount per rectum three times a day 30 g 3  . Biotin 5000 MCG TABS Take 1 tablet by mouth daily.    Marland Kitchen dicyclomine (BENTYL) 10 MG capsule TAKE ONE CAPSULE 3 TIMES DAILY BEFORE MEALS 90 capsule 1  . diltiazem (CARDIZEM CD) 120 MG 24 hr capsule TAKE ONE CAPSULE BY MOUTH AT BEDTIME 90 capsule 3  . docusate sodium (COLACE) 100 MG capsule Take 1 capsule (100 mg total) by mouth 2 (two) times daily. 10 capsule 0  . ELIQUIS 5 MG TABS tablet TAKE ONE (1) TABLET BY MOUTH TWO (2) TIMES DAILY 180 tablet 1  . escitalopram (LEXAPRO) 20 MG tablet Take 10 mg by mouth daily.    . furosemide (LASIX) 20 MG tablet Take 1 tablet (20 mg total) by mouth daily as needed for fluid. 30 tablet   . HYDROcodone-acetaminophen (NORCO/VICODIN) 5-325 MG tablet Take 1/2 to 1 tablet as needed for pain 15 tablet 0  . levothyroxine (SYNTHROID, LEVOTHROID) 112 MCG tablet Take 112 mcg by mouth daily before breakfast.     . linaclotide (LINZESS) 72 MCG capsule Take 1 capsule (72 mcg total) by mouth daily before breakfast. 30 capsule 3  . metroNIDAZOLE (METROGEL) 1 % gel Apply topically.    . propafenone (RYTHMOL) 150 MG tablet TAKE 1 AND 1/2 TABLETS EVERY MORNING, 1 TABLET MID-DAY AND 1 AND 1/2 TABLETS EVERY EVENING 360 tablet 2  . traZODone (DESYREL) 150 MG tablet Take 75 mg by mouth at bedtime.     . triamcinolone cream (KENALOG) 0.1 % Apply to affected areas on face twice daily    . atenolol (TENORMIN) 25 MG tablet Take 0.5 tablets (12.5 mg total) by mouth 2 (two) times daily. 90 tablet 3  . clindamycin (CLEOCIN) 300 MG capsule Take 1 capsule (300 mg total) by mouth 3 (three) times daily. 21 capsule 0  . linaclotide (LINZESS) 72 MCG capsule Take 1 capsule (72 mcg total) by mouth daily before breakfast. 30 capsule 3   Facility-Administered Medications Prior to Visit  Medication Dose Route Frequency Provider Last Rate Last Dose  . 0.9 %  sodium chloride infusion  500  mL Intravenous Continuous Nandigam, Kavitha V, MD         Allergies:   Azithromycin; Epinephrine; Penicillins; Lidocaine; and Metoprolol   Social History   Socioeconomic History  . Marital status: Legally Separated    Spouse name: Not on file  . Number of children: 3  . Years of education: Not on file  . Highest education level: Not on file  Occupational History  . Occupation: retired    Associate Professor: RETIRED  Social Needs  . Financial resource strain: Not on file  . Food insecurity:    Worry: Not on file    Inability: Not on file  . Transportation needs:    Medical: Not on file  Non-medical: Not on file  Tobacco Use  . Smoking status: Former Smoker    Last attempt to quit: 08/30/1996    Years since quitting: 21.6  . Smokeless tobacco: Never Used  Substance and Sexual Activity  . Alcohol use: Yes    Alcohol/week: 6.0 standard drinks    Types: 6 Glasses of wine per week    Comment: daily  . Drug use: No  . Sexual activity: Yes  Lifestyle  . Physical activity:    Days per week: Not on file    Minutes per session: Not on file  . Stress: Not on file  Relationships  . Social connections:    Talks on phone: Not on file    Gets together: Not on file    Attends religious service: Not on file    Active member of club or organization: Not on file    Attends meetings of clubs or organizations: Not on file    Relationship status: Not on file  Other Topics Concern  . Not on file  Social History Narrative   Lives in [place],  Former [employer],  Separated.       Family History:  The patient's family history includes Anesthesia problems in her mother; Colon polyps in her mother.   ROS:   Please see the history of present illness.    ROS All other systems reviewed and are negative.   PHYSICAL EXAM:   VS:  BP 102/64   Pulse 61   Ht 5\' 5"  (1.651 m)   Wt 139 lb 12.8 oz (63.4 kg)   BMI 23.26 kg/m    GEN: Well nourished, well developed, in no acute distress    HEENT: normal  Neck: no JVD, carotid bruits, or masses Cardiac: RRR; no murmurs, rubs, or gallops,no edema  Respiratory:  clear to auscultation bilaterally, normal work of breathing GI: soft, nontender, nondistended, + BS MS: no deformity or atrophy  Skin: warm and dry, no rash Neuro:  Alert and Oriented x 3, Strength and sensation are intact Psych: euthymic mood, full affect  Wt Readings from Last 3 Encounters:  04/29/18 139 lb 12.8 oz (63.4 kg)  03/26/18 141 lb (64 kg)  02/12/18 146 lb 6.4 oz (66.4 kg)      Studies/Labs Reviewed:   EKG:  EKG is not ordered today.    Recent Labs: No results found for requested labs within last 8760 hours.   Lipid Panel    Component Value Date/Time   CHOL  12/19/2006 0315    172        ATP III CLASSIFICATION:  <200     mg/dL   Desirable  527-782  mg/dL   Borderline High  >=423    mg/dL   High   TRIG 536 (H) 14/43/1540 0315   HDL 37 (L) 12/19/2006 0315   CHOLHDL 4.6 12/19/2006 0315   VLDL 59 (H) 12/19/2006 0315   LDLCALC  12/19/2006 0315    76        Total Cholesterol/HDL:CHD Risk Coronary Heart Disease Risk Table                     Men   Women  1/2 Average Risk   3.4   3.3    Additional studies/ records that were reviewed today include:   Echo 11/13/2016 LV EF: 55% -   60%  ------------------------------------------------------------------- Indications:      I48.91 Atrial fibrillatio.  ------------------------------------------------------------------- History:   Risk factors:  Hypertension.  ------------------------------------------------------------------- Study Conclusions  - Left ventricle: The cavity size was normal. Wall thickness was   normal. Systolic function was normal. The estimated ejection   fraction was in the range of 55% to 60%. Left ventricular   diastolic function parameters were normal. - Mitral valve: There was mild regurgitation. - Left atrium: The atrium was mildly dilated. - Pulmonary  arteries: PA peak pressure: 38 mm Hg (S).    ASSESSMENT:    1. Hypotension due to drugs   2. Paroxysmal a-fib, recurrent 07/13/12- (on Propafenone)    3. Pacemaker   4. Hypothyroidism, unspecified type      PLAN:  In order of problems listed above:  1. Hypotension: Intolerant of atenolol, patient has came off of atenolol by herself in the past 4 days because her blood pressure was dropping down to the 90s and she was very dizzy.  We will continue on diltiazem.  I instructed her to take atenolol only on as-needed basis for breakthrough atrial fibrillation  2. Paroxysmal atrial fibrillation: She has very good cardiac awareness.  On diltiazem and Eliquis.  3. Sinus nodal dysfunction status post pacemaker: Followed by Dr. Royann Shivers  4. Hypothyroidism: Followed by primary care provider.    Medication Adjustments/Labs and Tests Ordered: Current medicines are reviewed at length with the patient today.  Concerns regarding medicines are outlined above.  Medication changes, Labs and Tests ordered today are listed in the Patient Instructions below. Patient Instructions  Medication Instructions:  DECREASE Atenolol to 12.5mg  Take 1 tablet as needed for breakthrough a fib(tablet ONLY comes in a 25mg  break tablet in half) If you need a refill on your cardiac medications before your next appointment, please call your pharmacy.   Lab work: Your physician recommends that you return for lab work in: COMPLETE AT PCP YEARLY APPT-CBC If you have labs (blood work) drawn today and your tests are completely normal, you will receive your results only by: Marland Kitchen MyChart Message (if you have MyChart) OR . A paper copy in the mail If you have any lab test that is abnormal or we need to change your treatment, we will call you to review the results.  Testing/Procedures: NONE   Follow-Up: At [facility], you and your health needs are our priority.  As part of our continuing mission to provide you with  exceptional heart care, we have created designated Provider Care Teams.  These Care Teams include your primary Cardiologist (physician) and Advanced Practice Providers (APPs -  Physician Assistants and Nurse Practitioners) who all work together to provide you with the care you need, when you need it. Marland Kitchen Jodelle Fausto RECOMMENDS YOU FOLLOW UP WITH DR [name] AS SCHEDULED  Any Other Special Instructions Will Be Listed Below (If Applicable). Kyndall Amero RECOMMENDS YOU WEAR COMPRESSION STOCKINGS YOU CAN PURCHASE THEM OVER THE COUNTER; PURCHASE THE LOWEST STRENGTH (20-73mmHg) PROVIDER COMPLETED ELIQUIS PATIENT ASSISTANCE FORM.     Ramond Dial, [place]  05/01/2018 11:51 PM    [facility] [address] [place], [place], [place]  [address] Phone: [phone]; Fax: [phone]

## 2018-04-29 NOTE — Patient Instructions (Addendum)
Medication Instructions:  DECREASE Atenolol to 12.5mg  Take 1 tablet as needed for breakthrough a fib(tablet ONLY comes in a 25mg  break tablet in half) If you need a refill on your cardiac medications before your next appointment, please call your pharmacy.   Lab work: Your physician recommends that you return for lab work in: COMPLETE AT PCP YEARLY APPT-CBC If you have labs (blood work) drawn today and your tests are completely normal, you will receive your results only by: MyChart Message (if you have MyChart) OR . A paper copy in the mail If you have any lab test that is abnormal or we need to change your treatment, we will call you to review the results.  Testing/Procedures: NONE   Follow-Up: At [facility], you and your health needs are our priority.  As part of our continuing mission to provide you with exceptional heart care, we have created designated Provider Care Teams.  These Care Teams include your primary Cardiologist (physician) and Advanced Practice Providers (APPs -  Physician Assistants and Nurse Practitioners) who all work together to provide you with the care you need, when you need it. [facility] RECOMMENDS YOU FOLLOW UP WITH DR Marland Kitchen AS SCHEDULED  Any Other Special Instructions Will Be Listed Below (If Applicable). [facility] RECOMMENDS YOU WEAR COMPRESSION STOCKINGS YOU CAN PURCHASE THEM OVER THE COUNTER; PURCHASE THE LOWEST STRENGTH (20-54mmHg) PROVIDER COMPLETED ELIQUIS PATIENT ASSISTANCE FORM.

## 2018-05-01 ENCOUNTER — Encounter: Payer: Self-pay | Admitting: Physician Assistant

## 2018-05-08 ENCOUNTER — Ambulatory Visit (HOSPITAL_BASED_OUTPATIENT_CLINIC_OR_DEPARTMENT_OTHER): Payer: Medicare Other | Attending: Cardiovascular Disease | Admitting: Cardiovascular Disease

## 2018-05-08 DIAGNOSIS — Z950 Presence of cardiac pacemaker: Secondary | ICD-10-CM | POA: Insufficient documentation

## 2018-05-08 DIAGNOSIS — G4761 Periodic limb movement disorder: Secondary | ICD-10-CM | POA: Insufficient documentation

## 2018-05-08 DIAGNOSIS — G4733 Obstructive sleep apnea (adult) (pediatric): Secondary | ICD-10-CM | POA: Diagnosis not present

## 2018-05-08 DIAGNOSIS — R0902 Hypoxemia: Secondary | ICD-10-CM | POA: Diagnosis not present

## 2018-05-08 DIAGNOSIS — R0681 Apnea, not elsewhere classified: Secondary | ICD-10-CM

## 2018-05-08 DIAGNOSIS — I480 Paroxysmal atrial fibrillation: Secondary | ICD-10-CM | POA: Insufficient documentation

## 2018-05-08 DIAGNOSIS — I495 Sick sinus syndrome: Secondary | ICD-10-CM | POA: Diagnosis present

## 2018-05-08 DIAGNOSIS — R0683 Snoring: Secondary | ICD-10-CM

## 2018-05-12 ENCOUNTER — Telehealth: Payer: Self-pay

## 2018-05-12 NOTE — Telephone Encounter (Signed)
I called [employer] and spoke with  A representative and he stated patients income was too high and that in order to qualify you must make under $30,700 a year.   Spoke with patient and informed her that we received a letter from [employer] stating patient no eligible for patient assistance for her Eliquis because her income is to high. Patient stated she only makes $1900 a month on social security and thinks her daughter completed her from incorrectly. I told her that the company would send her a letter and that she should give them a call to address her concerns. She voiced understanding.  Copy of letter scanned into patients chart.

## 2018-05-15 ENCOUNTER — Emergency Department (HOSPITAL_COMMUNITY): Payer: Medicare Other

## 2018-05-15 ENCOUNTER — Encounter (HOSPITAL_BASED_OUTPATIENT_CLINIC_OR_DEPARTMENT_OTHER): Payer: Self-pay | Admitting: Cardiovascular Disease

## 2018-05-15 ENCOUNTER — Encounter (HOSPITAL_COMMUNITY): Payer: Self-pay

## 2018-05-15 ENCOUNTER — Emergency Department (HOSPITAL_COMMUNITY)
Admission: EM | Admit: 2018-05-15 | Discharge: 2018-05-15 | Disposition: A | Discharge status: Home / Self Care | Payer: Medicare Other | Attending: Emergency Medicine | Admitting: Emergency Medicine

## 2018-05-15 ENCOUNTER — Other Ambulatory Visit: Payer: Self-pay

## 2018-05-15 DIAGNOSIS — Y929 Unspecified place or not applicable: Secondary | ICD-10-CM | POA: Insufficient documentation

## 2018-05-15 DIAGNOSIS — R110 Nausea: Secondary | ICD-10-CM | POA: Diagnosis not present

## 2018-05-15 DIAGNOSIS — Y939 Activity, unspecified: Secondary | ICD-10-CM | POA: Insufficient documentation

## 2018-05-15 DIAGNOSIS — Z79891 Long term (current) use of opiate analgesic: Secondary | ICD-10-CM | POA: Insufficient documentation

## 2018-05-15 DIAGNOSIS — R51 Headache: Secondary | ICD-10-CM | POA: Diagnosis present

## 2018-05-15 DIAGNOSIS — E039 Hypothyroidism, unspecified: Secondary | ICD-10-CM | POA: Insufficient documentation

## 2018-05-15 DIAGNOSIS — Z95811 Presence of heart assist device: Secondary | ICD-10-CM | POA: Diagnosis not present

## 2018-05-15 DIAGNOSIS — Z79899 Other long term (current) drug therapy: Secondary | ICD-10-CM | POA: Diagnosis not present

## 2018-05-15 DIAGNOSIS — Y999 Unspecified external cause status: Secondary | ICD-10-CM | POA: Diagnosis not present

## 2018-05-15 DIAGNOSIS — Z7901 Long term (current) use of anticoagulants: Secondary | ICD-10-CM | POA: Insufficient documentation

## 2018-05-15 DIAGNOSIS — I10 Essential (primary) hypertension: Secondary | ICD-10-CM | POA: Diagnosis not present

## 2018-05-15 DIAGNOSIS — W1830XA Fall on same level, unspecified, initial encounter: Secondary | ICD-10-CM | POA: Insufficient documentation

## 2018-05-15 DIAGNOSIS — S0990XA Unspecified injury of head, initial encounter: Secondary | ICD-10-CM | POA: Insufficient documentation

## 2018-05-15 MED ORDER — HYDROCODONE-ACETAMINOPHEN 5-325 MG PO TABS
1.0000 | ORAL_TABLET | Freq: Once | ORAL | Status: AC
  Administered 2018-05-15: 1 via ORAL
  Filled 2018-05-15: qty 1

## 2018-05-15 NOTE — Procedures (Signed)
Patient Name: Jacqueline Kim, Jacqueline Kim Date: 05/08/2018 Gender: Female D.O.B: 1946/01/17 Age (years): 7 Referring Provider: Nicki Guadalajara MD, ABSM Height (inches): 64 Interpreting Physician: Nicki Guadalajara MD, ABSM Weight (lbs): 135 RPSGT: Rolene Arbour BMI: 23 MRN: [id number] Neck Size: 13.50  CLINICAL INFORMATION Sleep Study Type: NPSG  Indication for sleep study: Snoring, Witnessed Apneas  Epworth Sleepiness Score: 3  SLEEP STUDY TECHNIQUE As per the AASM Manual for the Scoring of Sleep and Associated Events v2.3 (April 2016) with a hypopnea requiring 4% desaturations.  The channels recorded and monitored were frontal, central and occipital EEG, electrooculogram (EOG), submentalis EMG (chin), nasal and oral airflow, thoracic and abdominal wall motion, anterior tibialis EMG, snore microphone, electrocardiogram, and pulse oximetry.  MEDICATIONS     atenolol (TENORMIN) 25 MG tablet             Biotin 5000 MCG TABS         dicyclomine (BENTYL) 10 MG capsule         diltiazem (CARDIZEM CD) 120 MG 24 hr capsule         docusate sodium (COLACE) 100 MG capsule         ELIQUIS 5 MG TABS tablet         escitalopram (LEXAPRO) 20 MG tablet         furosemide (LASIX) 20 MG tablet         HYDROcodone-acetaminophen (NORCO/VICODIN) 5-325 MG tablet         levothyroxine (SYNTHROID, LEVOTHROID) 112 MCG tablet         linaclotide (LINZESS) 72 MCG capsule         metroNIDAZOLE (METROGEL) 1 % gel         propafenone (RYTHMOL) 150 MG tablet         traZODone (DESYREL) 150 MG tablet         triamcinolone cream (KENALOG) 0.1 %      Medications self-administered by patient taken the night of the study : BENTYL, DILITIAZEM, ELIQUIS, ACETAMINOPHEN-HYDROCODONE, PROPAFENONE, TRAZADONE, PLAQUENIL  SLEEP ARCHITECTURE The study was initiated at 10:17:06 PM and ended at 4:48:03 AM.  Sleep onset time was 92.3 minutes and the sleep efficiency was 51.5%%. The total sleep time was 201.5  minutes.  Stage REM latency was 42.5 minutes.  The patient spent 3.2%% of the night in stage N1 sleep, 73.0%% in stage N2 sleep, 0.0%% in stage N3 and 23.8% in REM.  Alpha intrusion was absent.  Supine sleep was 100.00%.  RESPIRATORY PARAMETERS The overall apnea/hypopnea index (AHI) was 13.4 per hour. The respiratory diisturbance index (RDI) was  14.6/h. There were 0 total apneas, including 0 obstructive, 0 central and 0 mixed apneas. There were 45 hypopneas and 4 RERAs.  The AHI during Stage REM sleep was 22.5 per hour.  AHI while supine was 13.4 per hour.  The mean oxygen saturation was 91.2%. The minimum SpO2 during sleep was 80.0%.  Soft snoring was noted during this study.  CARDIAC DATA The 2 lead EKG demonstrated pacemaker generated. The mean heart rate was 60.1 beats per minute. Other EKG findings include: None.  LEG MOVEMENT DATA   IMPRESSIONS - Mild obstructive sleep apnea overall  (AHI 13.4/h; RDI 14.6/h); however, sleep apnea was moderate during REM sleep (AHI 22.5/h). - No significant central sleep apnea occurred during this study (CAI = 0.0/h). - Moderate oxygen desaturation to a nadir of 80%. - The patient snored with soft snoring volume. - No cardiac abnormalities were noted during this  study. - Clinically significant periodic limb movements did not occur during sleep  with an index of 6.8.  Associated arousals were significant at 5.1.  DIAGNOSIS - Obstructive Sleep Apnea (327.23 [G47.33 ICD-10]) - Periodic Limb Movement During Sleep (327.51 [G47.61 ICD-10]) - Nocturnal Hypoxemia (327.26 [G47.36 ICD-10])  RECOMMENDATIONS - In this patient with significant cardiovascular comorbidities including PAF, recommend therapeutic CPAP titration to determine optimal pressure required to alleviate sleep disordered breathing. - Effort should be made to optimize nasal and oropharyngeal patency. - Positional therapy avoiding supine position during sleep. - Avoid alcohol,  sedatives and other CNS depressants that may worsen sleep apnea and disrupt normal sleep architecture. - Sleep hygiene should be reviewed to assess factors that may improve sleep quality. - Weight management and regular exercise should be initiated or continued if appropriate.  [Electronically signed] 05/15/2018 05:23 PM  Nicki Guadalajara MD, [facility], ABSM Diplomate, American Board of Sleep Medicine   NPI: [id number] [facility] PH: [phone]   FX: [phone] ACCREDITED BY THE AMERICAN ACADEMY OF SLEEP MEDICINE

## 2018-05-15 NOTE — ED Notes (Signed)
ED Provider at bedside. 

## 2018-05-15 NOTE — ED Provider Notes (Signed)
[facility] EMERGENCY DEPARTMENT Provider Note   CSN: [id number] Arrival date & time: 05/15/18  2148     History   Chief Complaint Chief Complaint  Patient presents with  . Head Injury    on blood thinners    HPI Jacqueline Kim is a 72 y.o. female.  72 yo F with a chief complaint of closed head injury.  The patient was walking with her walker and felt that the walker collapsed and she lost her balance and landed on her head.  She denies loss consciousness denies unilateral numbness or weakness denies other injury.  She denies neck pain chest pain abdominal pain extremity pain.  She has had some mild nausea worse with head movement.  She fell about a week ago but did not seek medical care.  Her daughters were in town for Thanksgiving and encouraged her to come to the ED.  She is on Eliquis for atrial fibrillation.  The history is provided by the patient.  Head Injury   The incident occurred 1 to 2 hours ago. She came to the ER via walk-in. The injury mechanism was a direct blow. There was no loss of consciousness. There was no blood loss. The quality of the pain is described as dull and throbbing. The pain is at a severity of 3/10. The pain is mild. The pain has been constant since the injury. Pertinent negatives include no vomiting. Associated symptoms comments: nausea. She has tried nothing for the symptoms. The treatment provided no relief.    Past Medical History:  Diagnosis Date  . Anticoagulant long-term use  WITH PRADAXA 05/27/2011  . Anxiety   . Cellulitis of buttock, left 12/20/2014  . Complication of anesthesia    DIFFICULTY WAKING UP  . Depression   . Diverticulosis   . DJD (degenerative joint disease)   . Esophagitis 09/17/06  . External hemorrhoids   . GERD (gastroesophageal reflux disease)   . Glaucoma   . History of stress test    a. 2008 - normal nuc.  Marland Kitchen Hypertension   . Hypothyroid   . IBS (irritable bowel syndrome)   . Paroxysmal A-fib  [facility]) diagnosed 2008   a. On propafenone, Pradaxa.  . 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 Rectal bleeding 02/01/2016  . Constipation 02/01/2016  . Dehydration 05/05/2015  . S/P left TKA 05/01/2015  . S/P knee replacement 05/01/2015  . Hypotension due to drugs 02/20/2015  . Cellulitis of left buttock 12/20/2014  . Cellulitis 12/20/2014  . AKI (acute kidney injury) (HCC) 12/20/2014  . Hyponatremia 12/20/2014  . Paroxysmal atrial fibrillation (HCC) 08/01/2014  . Irritable bowel syndrome 06/14/2014  . Fatigue 03/06/2014  . HTN (hypertension) 09/28/2013  . Atrial fibrillation with rapid ventricular response; paroxysmal 09/18/2013  . Chest pain 10/08/2012  . D-dimer, elevated, pt. on pradaxa 09/11/2011  . Chest pain, negative MI, resolved once heart rate slowed, secondary to rapid a. fib 09/11/2011  . Paroxysmal a-fib, recurrent 07/13/12- (on Propafenone)  05/27/2011  . Sinus node dysfunction (HCC) 05/27/2011  . Hypothyroid 05/27/2011  . 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 Anxiety 05/27/2011  . Chronic anticoagulation 05/27/2011    Past Surgical History:  Procedure Laterality Date  . BREAST REDUCTION SURGERY    . BUNIONECTOMY Right 07/09/2015   with rods and  pins  . CARDIOVERSION N/A 08/02/2014   Procedure: CARDIOVERSION;  Surgeon: Quintella Reichert, MD;  Location: [facility] ENDOSCOPY;  Service: Cardiovascular;  Laterality: N/A;  . CATARACT EXTRACTION, BILATERAL Bilateral   . FACIAL COSMETIC SURGERY    . FOOT SURGERY Left   . HAND SURGERY     multiple  . LIPOSUCTION    . NM MYOCAR PERF WALL MOTION  12/29/2006   No significant ischemia; EF 69%  . OVARIAN CYST REMOVAL    . PACEMAKER INSERTION  04/08/11   MDT Revo implanted by Dr Rubie Maid  . TONSILLECTOMY    . TOTAL KNEE ARTHROPLASTY Left 05/01/2015    Procedure: TOTAL LEFT KNEE ARTHROPLASTY;  Surgeon: Durene Romans, MD;  Location: [facility] ORS;  Service: Orthopedics;  Laterality: Left;  Marland Kitchen VAGINAL HYSTERECTOMY       OB History   None      Home Medications    Prior to Admission medications   Medication Sig Start Date End Date Taking? Authorizing Provider  AMBULATORY NON FORMULARY MEDICATION Medication Name: Nitroglycerin ointment .0125% Pea size amount per rectum three times a day 10/14/16   Napoleon Form, MD  atenolol (TENORMIN) 25 MG tablet Take half tablet as needed for break through a fib 04/29/18   Azalee Course, PA  Biotin 5000 MCG TABS Take 1 tablet by mouth daily.    [provider]  dicyclomine (BENTYL) 10 MG capsule TAKE ONE CAPSULE 3 TIMES DAILY BEFORE MEALS 03/02/15   Pyrtle, Carie Caddy, MD  diltiazem (CARDIZEM CD) 120 MG 24 hr capsule TAKE ONE CAPSULE BY MOUTH AT BEDTIME 12/02/17   Croitoru, Mihai, MD  docusate sodium (COLACE) 100 MG capsule Take 1 capsule (100 mg total) by mouth 2 (two) times daily. 05/04/15   Lanney Gins, PA-C  ELIQUIS 5 MG TABS tablet TAKE ONE (1) TABLET BY MOUTH TWO (2) TIMES DAILY 11/02/17   [name], MD  escitalopram (LEXAPRO) 20 MG tablet Take 10 mg by mouth daily.    [provider]  furosemide (LASIX) 20 MG tablet Take 1 tablet (20 mg total) by mouth daily as needed for fluid. 12/25/14   Marinda Elk, MD  HYDROcodone-acetaminophen (NORCO/VICODIN) 5-325 MG tablet Take 1/2 to 1 tablet as needed for pain 12/29/16   Hayden Rasmussen, NP  levothyroxine (SYNTHROID, LEVOTHROID) 112 MCG tablet Take 112 mcg by mouth daily before breakfast.     [provider]  linaclotide (LINZESS) 72 MCG capsule Take 1 capsule (72 mcg total) by mouth daily before breakfast. 03/11/16   Nandigam, Eleonore Chiquito, MD  metroNIDAZOLE (METROGEL) 1 % gel Apply topically. 10/08/16   [provider]  propafenone (RYTHMOL) 150 MG tablet TAKE 1 AND 1/2 TABLETS EVERY MORNING, 1 TABLET MID-DAY AND 1 AND 1/2  TABLETS EVERY EVENING 06/08/17   [name], MD  traZODone (DESYREL) 150 MG tablet Take 75 mg by mouth at bedtime.     [provider]  triamcinolone cream (KENALOG) 0.1 % Apply to affected areas on face twice daily 10/01/16   [provider]    Family History Family History  Problem Relation Age of Onset  . Anesthesia problems Mother   . Colon polyps Mother        beign  . Colon cancer Neg Hx     Social History Social History   Tobacco Use  . Smoking status: Former Smoker    Last attempt to quit: 08/30/1996    Years since quitting: 21.7  . Smokeless tobacco: Never  Used  Substance Use Topics  . Alcohol use: Yes    Alcohol/week: 6.0 standard drinks    Types: 6 Glasses of wine per week    Comment: daily  . Drug use: No     Allergies   Azithromycin; Epinephrine; Penicillins; Lidocaine; and Metoprolol   Review of Systems Review of Systems  Constitutional: Negative for chills and fever.  HENT: Negative for congestion and rhinorrhea.   Eyes: Negative for redness and visual disturbance.  Respiratory: Negative for shortness of breath and wheezing.   Cardiovascular: Negative for chest pain and palpitations.  Gastrointestinal: Positive for nausea. Negative for vomiting.  Genitourinary: Negative for dysuria and urgency.  Musculoskeletal: Negative for arthralgias and myalgias.  Skin: Negative for pallor and wound.  Neurological: Positive for headaches. Negative for dizziness.     Physical Exam Updated Vital Signs BP 118/67 (BP Location: Right Arm)   Pulse 63   Temp 98 F (36.7 C) (Oral)   Resp 16   Ht 5\' 4"  (1.626 m)   Wt 61.2 kg   SpO2 97%   BMI 23.16 kg/m   Physical Exam  Constitutional: She is oriented to person, place, and time. She appears well-developed and well-nourished. No distress.  HENT:  Head: Normocephalic.  Hematoma to the right frontal region  Eyes: Pupils are equal, round, and reactive to light. EOM are normal.  Neck:  Normal range of motion. Neck supple.  Cardiovascular: Normal rate and regular rhythm. Exam reveals no gallop and no friction rub.  No murmur heard. Pulmonary/Chest: Effort normal. She has no wheezes. She has no rales.  Abdominal: Soft. She exhibits no distension. There is no tenderness.  Musculoskeletal: She exhibits no edema or tenderness.  Palpated from head to toe without any other noted areas of bony tenderness  Neurological: She is alert and oriented to person, place, and time.  Skin: Skin is warm and dry. She is not diaphoretic.  Psychiatric: She has a normal mood and affect. Her behavior is normal.  Nursing note and vitals reviewed.    ED Treatments / Results  Labs (all labs ordered are listed, but only abnormal results are displayed) Labs Reviewed - No data to display  EKG None  Radiology Ct Head Wo Contrast  Result Date: 05/15/2018 CLINICAL DATA:  Pt using walker fell with head impact. Bump on right side forehead. EXAM: CT HEAD WITHOUT CONTRAST TECHNIQUE: Contiguous axial images were obtained from the base of the skull through the vertex without intravenous contrast. COMPARISON:  08/30/2012 FINDINGS: Brain: No evidence of acute infarction, hemorrhage, hydrocephalus, extra-axial collection or mass lesion/mass effect. There is mild ventricular sulcal enlargement reflecting age-appropriate volume loss. Vascular: No hyperdense vessel or unexpected calcification. Skull: Normal. Negative for fracture or focal lesion. Sinuses/Orbits: Globes and orbits are unremarkable. Small amount of dependent secretions in the maxillary sinuses. Sinuses otherwise clear. Clear mastoid air cells. Other: Small right frontal scalp hematoma. IMPRESSION: 1. No acute intracranial abnormalities. 2. Right frontal scalp hematoma.  No skull fracture. Electronically Signed   By: Amie Portland M.D.   On: 05/15/2018 23:01    Procedures Procedures (including critical care time)  Medications Ordered in  ED Medications  HYDROcodone-acetaminophen (NORCO/VICODIN) 5-325 MG per tablet 1 tablet (1 tablet Oral Given 05/15/18 2210)     Initial Impression / Assessment and Plan / ED Course  I have reviewed the triage vital signs and the nursing notes.  Pertinent labs & imaging results that were available during my care of the patient were reviewed by  me and considered in my medical decision making (see chart for details).     72 yo F with a chief complaint of a closed head injury.  Mechanical fall.  Will obtain a CT of the head. CT negative for ICH as viewed by me.  D/c home.   11:06 PM:  I have discussed the diagnosis/risks/treatment options with the patient and family and believe the pt to be eligible for discharge home to follow-up with PCP. We also discussed returning to the ED immediately if new or worsening sx occur. We discussed the sx which are most concerning (e.g., sudden worsening pain, fever, inability to tolerate by mouth) that necessitate immediate return. Medications administered to the patient during their visit and any new prescriptions provided to the patient are listed below.  Medications given during this visit Medications  HYDROcodone-acetaminophen (NORCO/VICODIN) 5-325 MG per tablet 1 tablet (1 tablet Oral Given 05/15/18 2210)      The patient appears reasonably screen and/or stabilized for discharge and I doubt any other medical condition or other [facility] requiring further screening, evaluation, or treatment in the ED at this time prior to discharge.    Final Clinical Impressions(s) / ED Diagnoses   Final diagnoses:  Injury of head, initial encounter    ED Discharge Orders    None       Melene Plan, DO 05/15/18 2306

## 2018-05-15 NOTE — ED Triage Notes (Signed)
Pt had a mechanical fall while walking with her walker tonight. Pt taking Eliquis for a fib. Pt hit her head on the hardwood floor, bump noted to right side of forehead. No bleeding noted. Pt alert and oriented, nad noted at this time.

## 2018-05-15 NOTE — Discharge Instructions (Signed)
Take tylenol up to 1000mg (2 extra strength) four times a day as needed for pain.  Only take if you have a headache.

## 2018-05-15 NOTE — ED Notes (Signed)
Patient transported to CT 

## 2018-05-19 ENCOUNTER — Other Ambulatory Visit: Payer: Self-pay | Admitting: Cardiovascular Disease

## 2018-05-19 ENCOUNTER — Telehealth: Payer: Self-pay | Admitting: *Deleted

## 2018-05-19 DIAGNOSIS — G4733 Obstructive sleep apnea (adult) (pediatric): Secondary | ICD-10-CM

## 2018-05-19 NOTE — Telephone Encounter (Signed)
-----   Message from [name], MD sent at 05/15/2018  5:27 PM EST ----- Jacqueline Kim please notify of ofd results and schedule for CPAP titration study

## 2018-05-19 NOTE — Telephone Encounter (Signed)
Patient notified of sleep study results and recommendations.agrees to proceed with CPAP titration scheduled on 06/30/18.

## 2018-05-21 ENCOUNTER — Other Ambulatory Visit: Payer: Self-pay | Admitting: Cardiovascular Disease

## 2018-06-07 ENCOUNTER — Other Ambulatory Visit: Payer: Self-pay | Admitting: Cardiovascular Disease

## 2018-06-14 NOTE — Telephone Encounter (Addendum)
Received acceptance letter from [facility]. Patient has been notified directly and voiced understanding. Acceptance letter placed in medical records bin to be scanned into patients chart.

## 2018-06-28 ENCOUNTER — Ambulatory Visit (INDEPENDENT_AMBULATORY_CARE_PROVIDER_SITE_OTHER): Payer: Medicare Other | Admitting: Cardiovascular Disease

## 2018-06-28 ENCOUNTER — Encounter: Payer: Self-pay | Admitting: Cardiovascular Disease

## 2018-06-28 VITALS — BP 110/62 | HR 65 | Ht 65.0 in | Wt 135.8 lb

## 2018-06-28 DIAGNOSIS — I480 Paroxysmal atrial fibrillation: Secondary | ICD-10-CM

## 2018-06-28 DIAGNOSIS — Z7901 Long term (current) use of anticoagulants: Secondary | ICD-10-CM | POA: Diagnosis not present

## 2018-06-28 DIAGNOSIS — I10 Essential (primary) hypertension: Secondary | ICD-10-CM | POA: Diagnosis not present

## 2018-06-28 DIAGNOSIS — M069 Rheumatoid arthritis, unspecified: Secondary | ICD-10-CM

## 2018-06-28 DIAGNOSIS — I495 Sick sinus syndrome: Secondary | ICD-10-CM

## 2018-06-28 DIAGNOSIS — Z950 Presence of cardiac pacemaker: Secondary | ICD-10-CM

## 2018-06-28 DIAGNOSIS — G4733 Obstructive sleep apnea (adult) (pediatric): Secondary | ICD-10-CM | POA: Diagnosis not present

## 2018-06-28 DIAGNOSIS — I951 Orthostatic hypotension: Secondary | ICD-10-CM

## 2018-06-28 DIAGNOSIS — E039 Hypothyroidism, unspecified: Secondary | ICD-10-CM

## 2018-06-28 MED ORDER — MIDODRINE HCL 2.5 MG PO TABS: 2.5000 mg | ORAL_TABLET | Freq: Two times a day (BID) | ORAL | 1 refills | Status: DC

## 2018-06-28 MED ORDER — TRAZODONE HCL 150 MG PO TABS: ORAL_TABLET | ORAL | 1 refills | Status: DC

## 2018-06-28 NOTE — Patient Instructions (Signed)
Medication Instructions:  Decrease Trazodone to half of the half a tablet you have been taking (37.5 mg). Start Midodrine 2.5 mg tablet twice daily.  If you need a refill on your cardiac medications before your next appointment, please call your pharmacy.   Follow-Up: At [facility], you and your health needs are our priority.  As part of our continuing mission to provide you with exceptional heart care, we have created designated Provider Care Teams.  These Care Teams include your primary Cardiologist (physician) and Advanced Practice Providers (APPs -  Physician Assistants and Nurse Practitioners) who all work together to provide you with the care you need, when you need it. You will need a follow up appointment in 2-3 months. You may see Dr.Kelly or one of the following Advanced Practice Providers on your designated Care Team: Azalee Course, [place] . Micah Flesher, PA-C  Any Other Special Instructions Will Be Listed Below (If Applicable). Have the CPAP Titration.

## 2018-06-28 NOTE — Progress Notes (Signed)
Patient ID: Jacqueline Kim, female   DOB: 1946/02/08, 73 y.o.   MRN: [id number]     HPI: Jacqueline Kim is a 73 y.o. female  who presents for a 3 month follow-up cardiologic evaluation.  Jacqueline Kim has known history of sinus node dysfunction. She has history of paroxysmal atrial fibrillation. She is status post permanent pacemaker implanted on 04/08/2011 Medtronic Revo MRI compatible pacemaker. She also has a history of hypothyroidism. She has had recurrent episodes of paroxysmal atrial fibrillation and has been on pradaxa anticoagulation for several years after initially having been on warfarin.  She has been maintained on rhythmol SR 325 mg twice a day and atenolol 50 mg daily.  She also is on losartan 50 mg for hypertension.  She was hospitalized in January 2014 with recurrent AF in the setting of significant increased family stress. She is now separated and living in a different household from her husband [name].  She has had several episodes of recurrent AF and had an episode on 03/12/2013 which lasted for approximately 12 hours.  On the Saturday before Easter 2015 when she was rushing with family members and trying to prepare dinner she was hospitalized and presented with a heart rate at 150.  She was started on IV Cardizem for rate control.  She spontaneously cardioverted to sinus rhythm on April 6 prior to a planned TEE cardioversion.  I saw her in followup of that hospitalization and suggested an EP evaluation.  She saw [name] for discussion concerning atrial fibrillation ablation.  Apparently, at that time, the patient was feeling well. She opted against pursuing AF ablation presently.    A followup echo Doppler study on 10/24/2013 showed an ejection fraction of 60-65% and there was evidence for grade 1 diastolic function without wall motion abnormalities.  Her left atrial dimension was normal.    She is followed by [name] for her pacemaker.  She was hospitalized in early  February 2016 following a prolonged episode of atrial fibrillation with rapid ventricular response for which she was symptomatic.  She underwent her initial cardioversion on 07/27/2014.  When she saw [name] on August 08, 2014 interrogation of her pacemaker showed a 28 hour episode of atrial fibrillation without recurrence since the cardioversion.  She again developed a recurrent episode of atrial fibrillation on 08/30/2014 which led to an emergency room evaluation.  I last saw her in March.  I recommended follow-up evaluation with [name] and reconsideration of possible atrial fibrillation ablation.  She saw [name] at that time admitted that she was drinking wine on a daily basis.  Rather than pursue ablation she elected to reduce her EtOH intake.  She feels that she has done well and has felt one episode of palpitations during her period of increased stress that she is aware of since her last evaluation..  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  She denies exertional chest pain, shortness of breath, orthopnea, PND, presyncope or syncope or any neurologic sequelae.  When she met with [name].  There was no discussion of changing her anti-rhythmic therapy.    She underwent left knee surgery by [name].  She also fell on December 20, 2014 in the shower and was hospitalized for 2 days.  She developed a cellulitis and buttock abscess, which required anabiotic therapy.    She  lives at [facility].  She denies any episodes of  chest pain.  At times she notes some shortness of breath with activity.  She saw [name] in October 2017 and her pacemaker was functioning normally.  She did not have any recurrent atrial fibrillation and had only 2 brief episodes of PAT which combined last a total of 54 seconds.  She states she had stopped taking the losartan, which had been 50 mg because that time, she noted some lightheadedness unassociated with any  rhythm disturbance.  In August 2017, a CT of her abdomen was performed which did not explain some of her issues at the time of rectal bleeding and constipation.  She was found to have mild colonic diverticulosis without diverticulitis.  Incidentally, she was noted to have abdominal aortic atherosclerosis.    When I  saw her in 2018, she was hypertensive and I recommended reinstitution of losartan at least 25 mg depending upon her blood pressure, but if necessary to increase to 25 mg 3 times a day.  Since I last saw her in May 2018, she underwent an echo Doppler study which showed an EF of 55-60%.  She had normal diastolic parameters.  There was mild mitral regurgitation, mild left atrial dilation, and mild pulmonary hypertension with a PA peak pressure 38 mm.  She saw [name] on 11/13/2016 and her losartan dose was reduced to 25 mg daily.  She sees Dr. Naida Sleight for rheumatoid arthritis.  She is status post left knee replacement and tolerated this well.  Her primary physician is [name].    She has had issues with scoliosis and in the future may require possible surgery.  At times she has noticed some dragging of her left leg.  She is unaware of any recurrent episodes of atrial fibrillation.  Her last echo Doppler study in May 2018 showed an EF of 55-60%.  She had mild mitral regurgitation.  There was mild pulmonary hypertension with a PA pressure 38.  She had normal diastolic parameters.   She saw [name] on February 12, 2018.  At that time, interrogation of her pacemaker revealed one 3-minute episode in the last 6 months of recurrent PAF occurred on Oct 28, 2017.  However, she comes to the office today and states that last week sleeping she was awakened with a chest fluttering and was concerned that she was back in atrial fibrillation for questionable duration.   I last saw her in October 2019 she was there with her daughter.  She appeared much more frail.  Due to chronic back discomfort, she  was walking with assistance.  He had moved to a lower ground apartment from her second floor unit at [facility].    She  had issues with dizziness and has fallen on a couple occasions.  She was on atenolol 25 mg twice a day, propofenone150 mg tablets  1-1/2 in the morning, 1 at midday, 1-1/2 in the evening.  She was on Cardizem CD 120 mg at bedtime.  She was on Eliquis for anticoagulation.  For that evaluation, according to the family members she was noted to have significant snoring during sleep.  Is often waking up in the middle the night with possible gasping for air.  She denies any recent bruises particularly following her fall.  She is on levothyroxine for hypothyroidism.  During that evaluation, she was orthostatic.  She was 100% atrially placed.  I reduced her atenolol down to 12.5 mg.  I also recommended that she reduce her Lexapro and recommended 20 to 30 mm  compression stockings.  Due to continued episodes her atenolol was ultimately discontinued altogether.  I scheduled her for a sleep study to assess for sleep apnea.  Her sleep study was done on May 08, 2018 and was positive for sleep apnea with an overall AHI at 13.4/h.  Events were worse and sleep apnea was moderate during rem sleep with an AHI of 22.5/h.  She had significant oxygen desaturation to 80%.  She is scheduled to undergo CPAP titration later this week.  She presents for reevaluation.  Past Medical History:  Diagnosis Date  . Anticoagulant long-term use  WITH PRADAXA 05/27/2011  . Anxiety   . Cellulitis of buttock, left 12/20/2014  . Complication of anesthesia    DIFFICULTY WAKING UP  . Depression   . Diverticulosis   . DJD (degenerative joint disease)   . Esophagitis 09/17/06  . External hemorrhoids   . GERD (gastroesophageal reflux disease)   . Glaucoma   . History of stress test    a. 2008 - normal nuc.  Marland Kitchen Hypertension   . Hypothyroid   . IBS (irritable bowel syndrome)   . Paroxysmal A-fib [facility]) diagnosed 2008     a. On propafenone, Pradaxa.  . 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 BREAST REDUCTION SURGERY    . BUNIONECTOMY Right 07/09/2015   with rods and pins  . CARDIOVERSION N/A 08/02/2014   Procedure: CARDIOVERSION;  Surgeon: Sueanne Margarita, MD;  Location: [facility];  Service: Cardiovascular;  Laterality: N/A;  . CATARACT EXTRACTION, BILATERAL Bilateral   . FACIAL COSMETIC SURGERY    . FOOT SURGERY Left   . HAND SURGERY     multiple  . LIPOSUCTION    . NM MYOCAR PERF WALL MOTION  12/29/2006   No significant ischemia; EF 69%  . OVARIAN CYST REMOVAL    . PACEMAKER INSERTION  04/08/11   MDT Revo implanted by Dr Recardo Evangelist  . TONSILLECTOMY    . TOTAL KNEE ARTHROPLASTY Left 05/01/2015   Procedure: TOTAL LEFT KNEE ARTHROPLASTY;  Surgeon: Paralee Cancel, MD;  Location: [facility];  Service: Orthopedics;  Laterality: Left;  Marland Kitchen VAGINAL HYSTERECTOMY      Allergies  Allergen Reactions  . Azithromycin Other (See Comments)    Causes heart rhythm issues Other reaction(s): Other (See Comments) Causes heart rhythm issues  . Epinephrine Other (See Comments)    Causes A-Fib Other reaction(s): Increased Heart Rate (intolerance) States that it caused her heart to beat fast, so she will not take it. Causes A-Fib A-fib Triggers atrial fib   . Penicillins Anaphylaxis    Has patient had a PCN reaction causing immediate rash, facial/tongue/throat swelling, SOB or lightheadedness with hypotension: Yes Has patient had a PCN reaction causing severe rash involving mucus membranes or skin necrosis: No Has patient had a PCN reaction that required hospitalization No Has patient had a PCN reaction occurring within the last 10 years: No If all of the above answers are "NO", then may proceed with Cephalosporin use.   . Lidocaine Palpitations    Or any  similar medication Other reaction(s): Other (See Comments) Or any similar medication  . Metoprolol Other (See Comments)    alopecia Other reaction(s): Other (See Comments) alopecia    Current Outpatient Medications  Medication Sig Dispense Refill  . AMBULATORY NON FORMULARY MEDICATION Medication Name: Nitroglycerin ointment .  0125% Pea size amount per rectum three times a day 30 g 3  . dicyclomine (BENTYL) 10 MG capsule TAKE ONE CAPSULE 3 TIMES DAILY BEFORE MEALS 90 capsule 1  . diltiazem (CARDIZEM CD) 120 MG 24 hr capsule TAKE ONE CAPSULE BY MOUTH AT BEDTIME 90 capsule 3  . docusate sodium (COLACE) 100 MG capsule Take 1 capsule (100 mg total) by mouth 2 (two) times daily. 10 capsule 0  . ELIQUIS 5 MG TABS tablet TAKE ONE (1) TABLET BY MOUTH TWO (2) TIMES DAILY 180 tablet 1  . escitalopram (LEXAPRO) 20 MG tablet Take 10 mg by mouth daily.    Marland Kitchen HYDROcodone-acetaminophen (NORCO/VICODIN) 5-325 MG tablet Take 1/2 to 1 tablet as needed for pain 15 tablet 0  . levothyroxine (SYNTHROID, LEVOTHROID) 112 MCG tablet Take 112 mcg by mouth daily before breakfast.     . linaclotide (LINZESS) 72 MCG capsule Take 1 capsule (72 mcg total) by mouth daily before breakfast. 30 capsule 3  . metroNIDAZOLE (METROGEL) 1 % gel Apply topically.    . propafenone (RYTHMOL) 150 MG tablet TAKE ONE AND ON-HALF TABLETS BY MOUTH EVERY MORNING, ONE TABLET BY MOUTH MID-DAY, AND ONE AND ONE-HALF TABLETS AT BEDTIME 360 tablet 1  . traZODone (DESYREL) 150 MG tablet Cut the half a tablet in half to make (37.5 mg) 30 tablet 1  . triamcinolone cream (KENALOG) 0.1 % Apply to affected areas on face twice daily    . midodrine (PROAMATINE) 2.5 MG tablet Take 1 tablet (2.5 mg total) by mouth 2 (two) times daily with a meal. 90 tablet 1   Current Facility-Administered Medications  Medication Dose Route Frequency Provider Last Rate Last Dose  . 0.9 %  sodium chloride infusion  500 mL Intravenous Continuous Nandigam, Venia Minks, MD          Social History   Socioeconomic History  . Marital status: Divorced    Spouse name: Not on file  . Number of children: 3  . Years of education: Not on file  . Highest education level: Not on file  Occupational History  . Occupation: retired    Fish farm manager: RETIRED  Social Needs  . Financial resource strain: Not on file  . Food insecurity:    Worry: Not on file    Inability: Not on file  . Transportation needs:    Medical: Not on file    Non-medical: Not on file  Tobacco Use  . Smoking status: Former Smoker    Last attempt to quit: 08/30/1996    Years since quitting: 21.8  . Smokeless tobacco: Never Used  Substance and Sexual Activity  . Alcohol use: Yes    Alcohol/week: 6.0 standard drinks    Types: 6 Glasses of wine per week    Comment: daily  . Drug use: No  . Sexual activity: Yes  Lifestyle  . Physical activity:    Days per week: Not on file    Minutes per session: Not on file  . Stress: Not on file  Relationships  . Social connections:    Talks on phone: Not on file    Gets together: Not on file    Attends religious service: Not on file    Active member of club or organization: Not on file    Attends meetings of clubs or organizations: Not on file    Relationship status: Not on file  . Intimate partner violence:    Fear of current or ex partner: Not on file  Emotionally abused: Not on file    Physically abused: Not on file    Forced sexual activity: Not on file  Other Topics Concern  . Not on file  Social History Narrative   Lives in [place],  Former [employer],  Separated.      Family History  Problem Relation Age of Onset  . Anesthesia problems Mother   . Colon polyps Mother        beign  . Colon cancer Neg Hx    ROS General: Negative; No fevers, chills, or night sweats;  HEENT: Negative; No changes in vision or hearing, sinus congestion, difficulty swallowing Pulmonary: Negative; No cough, wheezing, shortness of breath,  hemoptysis Cardiovascular:  See HPI GI: Positive for irritable bowel syndrome for which she takes Bentyl before meals;  GU: Negative; No dysuria, hematuria, or difficulty voiding Musculoskeletal: Positive for rheumatoid arthritis, and scoliosis. Hematologic/Oncology: Negative; no easy bruising, bleeding Endocrine: Negative; no heat/cold intolerance; no diabetes Neuro: Negative; no changes in balance, headaches Skin: Negative; No rashes or skin lesions Psychiatric: There is less stress and anxiety; No behavioral problems, depression Sleep: Positive for recently diagnosed OSA.  Positive snoring, frequent awakeningsOther comprehensive 14 point system review is negative.   PE BP 110/62   Pulse 65   Ht '5\' 5"'  (1.651 m)   Wt 135 lb 12.8 oz (61.6 kg)   BMI 22.60 kg/m    Repeat blood pressure by me was 106/62's sitting and 80/56 standing  Wt Readings from Last 3 Encounters:  06/28/18 135 lb 12.8 oz (61.6 kg)  05/15/18 134 lb 14.7 oz (61.2 kg)  05/08/18 135 lb (61.2 kg)   General: Alert, oriented, no distress.  Appears frail. Skin: normal turgor, no rashes, warm and dry HEENT: Normocephalic, atraumatic. Pupils equal round and reactive to light; sclera anicteric; extraocular muscles intact; Nose without nasal septal hypertrophy Mouth/Parynx benign; Mallinpatti scale 3 Neck: No JVD, no carotid bruits; normal carotid upstroke Lungs: clear to ausculatation and percussion; no wheezing or rales Chest wall: without tenderness to palpitation Heart: PMI not displaced, RRR, s1 s2 normal, 1/6 systolic murmur, no diastolic murmur, no rubs, gallops, thrills, or heaves Abdomen: soft, nontender; no hepatosplenomehaly, BS+; abdominal aorta nontender and not dilated by palpation. Back: no CVA tenderness Pulses 2+ Musculoskeletal: full range of motion, normal strength, no joint deformities Extremities: Osteoarthritis and rheumatoid arthritis changes;no clubbing cyanosis or edema, Homan's sign negative    Neurologic: grossly nonfocal; Cranial nerves grossly wnl Psychologic: Normal mood and affect   ECG (independently read by me): Lee paced rhythm with prolonged AV conduction.  Ventricular rate 65 bpm.  PR interval 248 ms.  No ectopy.  October 2019 ECG (independently read by me): Telemetry paced rhythm with prolonged AV conduction with PR interval 264 ms.  Ventricular rate 64 bpm.  QTc interval 478 ms.  May 2019 ECG (independently read by me): Atrially paced rhythm at 62 bpm with prolonged AV conduction.  PR interval '2 9 2 ' ms.  QTc interval '4 8 1 ' ms.  November 2018 ECG (independently read by me): atrially paced rhythm with prolonged AV conduction with PR interval at 264 ms.  PAC.  QTc interval increase that 503 ms.  May 2018 ECG (independently read by me): Atrially paced rhythm at 69 bpm.  Prolonged AV conduction with PR interval 266 milliseconds.  Increased QTc interval 497 ms.  August 2016 ECG (independently read by me): Atrially paced rhythm at 66 bpm.  PR interval 232 ms.  QTc interval normal at  448 ms.  ECG (independently read by me): Atrially paced rhythm at 64 bpm.  No AF.  QTC 435 ms.  December 2015 ECG (independently read by me): Atrially paced rhythm at 65 with prolonged AV conduction with a PR interval at 270 ms.  No significant ST segment changes.   June 2015 ECG (independently read by me): Atrially paced rhythm at 62 beats per minute with prolonged AV conduction with a PR interval of 244 ms.  QTc interval 444 ms.  Prior 09/22/2013 ECG (independently read by me): Atrial paced rhythm at 63 beats per minute with ventricular sensing.   LABS:  BMP Latest Ref Rng & Units 11/25/2016 01/22/2016 05/04/2015  Glucose 65 - 99 mg/dL 97 92 97  BUN 6 - 20 mg/dL '18 16 19  ' Creatinine 0.44 - 1.00 mg/dL 1.08(H) 1.13(H) 0.97  Sodium 135 - 145 mmol/L 131(L) 134(L) 132(L)  Potassium 3.5 - 5.1 mmol/L 4.2 4.1 4.9  Chloride 101 - 111 mmol/L 100(L) 100(L) 101  CO2 22 - 32 mmol/L '24 25 27  ' Calcium  8.9 - 10.3 mg/dL 9.8 9.7 8.7(L)    Hepatic Function Latest Ref Rng & Units 01/22/2016 12/21/2014 12/20/2014  Total Protein 6.5 - 8.1 g/dL 6.9 4.8(L) 6.0(L)  Albumin 3.5 - 5.0 g/dL 4.0 2.3(L) 3.0(L)  AST 15 - 41 U/L '26 19 25  ' ALT 14 - 54 U/L 14 13(L) 14  Alk Phosphatase 38 - 126 U/L 40 37(L) 47  Total Bilirubin 0.3 - 1.2 mg/dL 0.6 0.4 0.5  Bilirubin, Direct 0.0 - 0.3 mg/dL - - -    CBC Latest Ref Rng & Units 11/25/2016 10/14/2016 01/22/2016  WBC 4.0 - 10.5 K/uL 9.4 - 5.2  Hemoglobin 12.0 - 15.0 g/dL 11.3(L) 11.8(L) 11.8(L)  Hematocrit 36.0 - 46.0 % 34.8(L) - 36.6  Platelets 150 - 400 K/uL 220 - 239   Lab Results  Component Value Date   MCV 94.6 11/25/2016   MCV 96.6 01/22/2016   MCV 92.9 05/03/2015    Lab Results  Component Value Date   TSH 6.630 (H) 08/30/2014   Lab Results  Component Value Date   HGBA1C 5.3 04/01/2011    BNP    Component Value Date/Time   PROBNP 1,110.0 (H) 09/18/2013 0332    Lipid Panel     Component Value Date/Time   CHOL  12/19/2006 0315    172        ATP III CLASSIFICATION:  <200     mg/dL   Desirable  200-239  mg/dL   Borderline High  >=240    mg/dL   High   TRIG 293 (H) 12/19/2006 0315   HDL 37 (L) 12/19/2006 0315   CHOLHDL 4.6 12/19/2006 0315   VLDL 59 (H) 12/19/2006 0315   LDLCALC  12/19/2006 0315    76        Total Cholesterol/HDL:CHD Risk Coronary Heart Disease Risk Table                     Men   Women  1/2 Average Risk   3.4   3.3     RADIOLOGY: No results found.  IMPRESSION:  1. Essential hypertension   2. Paroxysmal atrial fibrillation (HCC)   3. Obstructive sleep apnea (adult) (pediatric)   4. Long term (current) use of anticoagulants   5. SSS (sick sinus syndrome) (HCC)   6. Pacemaker   7. Orthostatic hypotension   8. Hypothyroidism, unspecified type   9. Rheumatoid arthritis, involving unspecified site, unspecified  rheumatoid factor presence (HCC)     ASSESSMENT AND PLAN: Jacqueline Kim is a 73 year old  Caucasian female who has a history of hypertension, PAF and is status post permanent pacemaker for sinus node dysfunction. She is anticoagulated and has a Cha2ds2vasc score of at least 3. An echo Doppler study on 10/24/2013 showed normal systolic function with an ejection fraction of 60-65%.  There was grade 1 diastolic dysfunction.  There were no regional wall motion abnormalities.  The left atrium was normal in size .  Her most recent echo in May 2018 shows normal systolic and diastolic function, mild MR, mild LA dilation, and mild pulmonary hypertension.  In the past, her episodes of atrial fibrillation have seen to occur during periods of increased stress but she has experienced several episodes of recurrent AF which do not appear to be stress mediated.  She has seen [name] for consideration of ablation with her last evaluation in April 2016.  At that time, she opted against ablation and has continued with medical therapy.  On her  pacemaker evaluation in August 2019  one isolated episode of atrial fibrillation lasting 3 minutes was detected which occurred in May.  She also admits to probable episodes of atrial fibrillation since that interrogation and noticing it at sleep.  Due to concerns for sleep apnea she underwent a sleep study which revealed mild to moderate overall sleep apnea with an AHI of 13.4 overall but 22.5/h during rem sleep.  She had significant oxygen desaturation to 80%.  She will undergo CPAP titration this week.  Her ECG today confirms an atrially paced rhythm.  She is no longer on atenolol.  She continues to be orthostatic.  She has not used furosemide recently and this will be discontinued.  I have recommended reduction of her trazodone which was reduced from 150 down to 75 mg and I will now reduce this down to 37.5 mg due to her orthostasis.  We again discussed wearing the support stockings.  She continues to be on propofenone.  Hopefully with institution of CPAP therapy she will no  longer have nocturnal hypoxia and have significantly reduced PAF recurrence.  She has been evaluated by Dr. Amil Amen for her rheumatoid arthritis and when we check recent laboratory we will send this to him including a C met, CBC, in addition to TSH, magnesium and fasting lipid studies.  She also plans to see Dr. Jeanell Sparrow most for pain management of her low back.  I will see her in 2 to 3 months for cardiology reevaluation.  Time spent: 25 minutes Troy Sine, MD, [facility]  06/29/2018 2:23 PM

## 2018-06-29 ENCOUNTER — Encounter: Payer: Self-pay | Admitting: Cardiovascular Disease

## 2018-06-30 ENCOUNTER — Ambulatory Visit (HOSPITAL_BASED_OUTPATIENT_CLINIC_OR_DEPARTMENT_OTHER): Payer: Medicare Other | Attending: Cardiovascular Disease | Admitting: Cardiovascular Disease

## 2018-06-30 VITALS — Ht 64.0 in | Wt 135.0 lb

## 2018-06-30 DIAGNOSIS — G4733 Obstructive sleep apnea (adult) (pediatric): Secondary | ICD-10-CM

## 2018-07-12 ENCOUNTER — Telehealth: Payer: Self-pay

## 2018-07-12 NOTE — Telephone Encounter (Signed)
   [facility] Pre-operative Risk Assessment    Request for surgical clearance:  1. What type of surgery is being performed? C4-5 Anterior Cervical Fusion   2. When is this surgery scheduled? TBD   3. What type of clearance is required (medical clearance vs. Pharmacy clearance to hold med vs. Both)? Both  4. Are there any medications that need to be held prior to surgery and how long?Eliquis   5. Practice name and name of physician performing surgery? [facility]   6. What is your office phone number [phone]    7.   What is your office fax number [phone] ATTN: Lorriane Shire  8.   Anesthesia type (None, local, MAC, general) ? General   Jacqueline Kim 07/12/2018, 4:37 PM  _________________________________________________________________   (provider comments below)

## 2018-07-13 NOTE — Telephone Encounter (Signed)
Patient with diagnosis of afib on Eliquis for anticoagulation.    Procedure:  C4-5 Anterior Cervical Fusion  Date of procedure: TBD  CHADS2-VASc score of  4 (CHF, HTN, AGE, DM2, stroke/tia x 2, CAD, AGE, female)  Per office protocol, patient can hold Eliquis for 3 days prior to procedure.

## 2018-07-13 NOTE — Telephone Encounter (Signed)
   Primary Cardiologist: Nicki Guadalajara, MD  Chart reviewed as part of pre-operative protocol coverage. Patient was contacted 07/13/2018 in reference to pre-operative risk assessment for pending surgery as outlined below.  Jacqueline Kim was last seen on 06/28/2018 by Dr. [name].  Since that day, Jacqueline Kim has done well from a cardiac standpoint. She reports improvement in her energy since starting midodrine for blood pressure. She is limited in mobility due to chronic neck/back pain, however can still complete 4 METs easily without anginal complaints. Therefore, based on ACC/AHA guidelines, the patient would be at acceptable risk for the planned procedure without further cardiovascular testing.   Per pharmacy recommendations, patient can hold eliquis 3 days prior to her upcoming C spine surgery and should restart this medication as soon as she is cleared to do so by her Neurosurgeon.   I will route this recommendation to the requesting party via Epic fax function and remove from pre-op pool.  Please call with questions.  Beatriz Stallion, PA-C 07/13/2018, 1:40 PM

## 2018-07-18 ENCOUNTER — Encounter (HOSPITAL_BASED_OUTPATIENT_CLINIC_OR_DEPARTMENT_OTHER): Payer: Self-pay | Admitting: Cardiovascular Disease

## 2018-07-18 NOTE — Procedures (Signed)
Patient Name: Jacqueline Kim, Jacqueline Kim Study Date: 06/30/2018 Gender: Female D.O.B: 02-21-1946 Age (years): [age] Referring Provider: Nicki Guadalajarahomas Kelly MD, ABSM Height (inches): 64 Interpreting Physician: Nicki Guadalajarahomas Kelly MD, ABSM Weight (lbs): 135 RPSGT: Shelah LewandowskyGregory, [place] BMI: 23 MRN: [id number] Neck Size: 13.50  CLINICAL INFORMATION The patient is referred for a CPAP titration to treat sleep apnea.  Date of NPSG: 05/08/2018: AHI 13.4/h; RDI 14.6/h; REM sleep AHI 22.5/h; O2 nadir 80%.  SLEEP STUDY TECHNIQUE As per the AASM Manual for the Scoring of Sleep and Associated Events v2.3 (April 2016) with a hypopnea requiring 4% desaturations.  The channels recorded and monitored were frontal, central and occipital EEG, electrooculogram (EOG), submentalis EMG (chin), nasal and oral airflow, thoracic and abdominal wall motion, anterior tibialis EMG, snore microphone, electrocardiogram, and pulse oximetry. Continuous positive airway pressure (CPAP) was initiated at the beginning of the study and titrated to treat sleep-disordered breathing.  MEDICATIONS     dicyclomine (BENTYL) 10 MG capsule         diltiazem (CARDIZEM CD) 120 MG 24 hr capsule         docusate sodium (COLACE) 100 MG capsule         ELIQUIS 5 MG TABS tablet         escitalopram (LEXAPRO) 20 MG tablet         HYDROcodone-acetaminophen (NORCO/VICODIN) 5-325 MG tablet         levothyroxine (SYNTHROID, LEVOTHROID) 112 MCG tablet         linaclotide (LINZESS) 72 MCG capsule         metroNIDAZOLE (METROGEL) 1 % gel         midodrine (PROAMATINE) 2.5 MG tablet         propafenone (RYTHMOL) 150 MG tablet         traZODone (DESYREL) 150 MG tablet         triamcinolone cream (KENALOG) 0.1 %      Medications self-administered by patient taken the night of the study : BENTYL, DILITIAZEM, ELIQUIS, ACETAMINOPHEN-HYDROCODONE, PROPAFENONE, TRAZADONE, MIDODRINE  TECHNICIAN COMMENTS Comments added by technician: Patient had difficulty initiating  sleep. Comments added by scorer: N/A  RESPIRATORY PARAMETERS Optimal PAP Pressure (cm): 14 AHI at Optimal Pressure (/hr): 0.0 Overall Minimal O2 (%): 84.0 Supine % at Optimal Pressure (%): 0 Minimal O2 at Optimal Pressure (%): 92.0   SLEEP ARCHITECTURE The study was initiated at 10:14:58 PM and ended at 5:19:45 AM.  Sleep onset time was 91.7 minutes and the sleep efficiency was 51.6%%. The total sleep time was 219 minutes.  The patient spent 9.6%% of the night in stage N1 sleep, 90.4%% in stage N2 sleep, 0.0%% in stage N3 and 0% in REM.Stage REM latency was N/A minutes  Wake after sleep onset was 114.1. Alpha intrusion was absent. Supine sleep was 64.38%.  CARDIAC DATA The 2 lead EKG demonstrated sinus rhythm, pacemaker generated. The mean heart rate was 60.4 beats per minute. Other EKG findings include: None.  LEG MOVEMENT DATA The total Periodic Limb Movements of Sleep (PLMS) were 0. The PLMS index was 0.0. A PLMS index of <15 is considered normal in adults.  IMPRESSIONS - CPAP was initiated at 5 cm and was titrated to 14 cm of water. AHI at 13 cm was 24.2/h with O2 nadir 86% and although AHI at 14 was 0, REM sleep was not achieved on this study.  - Central sleep apnea was not noted during this titration (CAI = 0.3/h). - Moderate oxygen desaturations to a nadir  of 84% at 9 cm. - No snoring was audible during this study. - No cardiac abnormalities were observed during this study. - Clinically significant periodic limb movements were not noted during this study. Arousals associated with PLMs were rare.  DIAGNOSIS - Obstructive Sleep Apnea (327.23 [G47.33 ICD-10])  RECOMMENDATIONS - Recommend an initial trial of CPAP Auto therapy 14 -20 cm H2O with heated humidification.  A Small size [employer]kel Full Face Mask Simplus mask was used for the titration study. - Effort should be made to optimize nasal and oropharyngeal patency. - Avoid alcohol, sedatives and other CNS depressants  that may worsen sleep apnea and disrupt normal sleep architecture. - Sleep hygiene should be reviewed to assess factors that may improve sleep quality. - Weight management and regular exercise should be initiated or continued. - Recommend a download in 30 days and sleep clinic evaluation after 4 weeks of therapy.  [Electronically signed] 07/18/2018 10:33 AM  Nicki Guadalajarahomas Kelly MD, [facility] Community HospitalFACC, ABSM Diplomate, American Board of Sleep Medicine   NPI: [id number]  [facility] PH: [phone]   FX: [phone] ACCREDITED BY THE AMERICAN ACADEMY OF SLEEP MEDICINE

## 2018-07-20 ENCOUNTER — Telehealth: Payer: Self-pay | Admitting: *Deleted

## 2018-07-20 LAB — CUP PACEART INCLINIC DEVICE CHECK
Implantable Lead Location: 753859
Implantable Lead Location: 753860
Implantable Pulse Generator Implant Date: 20121023
MDC IDC LEAD IMPLANT DT: 20121023
MDC IDC LEAD IMPLANT DT: 20121023
MDC IDC SESS DTM: 20200204152426

## 2018-07-20 NOTE — Telephone Encounter (Signed)
CPAP order faxed to [facility] for set up. 

## 2018-08-11 LAB — CBC
Hematocrit: 34.7 % (ref 34.0–46.6)
Hemoglobin: 12.1 g/dL (ref 11.1–15.9)
MCH: 31.7 pg (ref 26.6–33.0)
MCHC: 34.9 g/dL (ref 31.5–35.7)
MCV: 91 fL (ref 79–97)
PLATELETS: 266 10*3/uL (ref 150–450)
RBC: 3.82 x10E6/uL (ref 3.77–5.28)
RDW: 12.2 % (ref 11.7–15.4)
WBC: 4.2 10*3/uL (ref 3.4–10.8)

## 2018-08-12 NOTE — Progress Notes (Signed)
Released to MyChart

## 2018-08-19 NOTE — Progress Notes (Signed)
The patient has been notified of the result and verbalized understanding.  All questions (if any) were answered. Dorris Fetch, CMA 08/19/2018 3:39 PM

## 2018-08-26 ENCOUNTER — Other Ambulatory Visit: Payer: Self-pay | Admitting: Cardiovascular Disease

## 2018-08-27 NOTE — Telephone Encounter (Signed)
Rx(s) sent to pharmacy electronically.  

## 2018-09-10 ENCOUNTER — Telehealth: Payer: Self-pay

## 2018-09-10 NOTE — Telephone Encounter (Signed)
Received fax that patient [employer] is denied due to patient's medication being covered by insurance.   Will sign, and scan this into chart.

## 2018-09-15 MED FILL — CLOTRIMAZOLE-BETAMETHASONE: 1-0.05 | 30 days supply | Qty: 30 | Fill #0

## 2018-09-23 ENCOUNTER — Telehealth: Payer: Self-pay

## 2018-09-23 NOTE — Telephone Encounter (Signed)
Virtual Visit Pre-Appointment Phone Call  Steps For Call:  1. Confirm consent - "In the setting of the current Covid19 crisis, you are scheduled for a (phone or video) visit with your provider on (date) at (time).  Just as we do with many in-office visits, in order for you to participate in this visit, we must obtain consent.  If you'd like, I can send this to your mychart (if signed up) or email for you to review.  Otherwise, I can obtain your verbal consent now.  All virtual visits are billed to your insurance company just like a normal visit would be.  By agreeing to a virtual visit, we'd like you to understand that the technology does not allow for your provider to perform an examination, and thus may limit your provider's ability to fully assess your condition.  Finally, though the technology is pretty good, we cannot assure that it will always work on either your or our end, and in the setting of a video visit, we may have to convert it to a phone-only visit.  In either situation, we cannot ensure that we have a secure connection.  Are you willing to proceed?"  2. Give patient instructions for WebEx download to smartphone as below if video visit  3. Advise patient to be prepared with any vital sign or heart rhythm information, their current medicines, and a piece of paper and pen handy for any instructions they may receive the day of their visit  4. Inform patient they will receive a phone call 15 minutes prior to their appointment time (may be from unknown caller ID) so they should be prepared to answer  5. Confirm that appointment type is correct in Epic appointment notes (video vs telephone)    TELEPHONE CALL NOTE  Jacqueline Kim has been deemed a candidate for a follow-up tele-health visit to limit community exposure during the Covid-19 pandemic. I spoke with the patient via phone to ensure availability of phone/video source, confirm preferred email & phone number, and discuss  instructions and expectations.  I reminded Jacqueline Kim to be prepared with any vital sign and/or heart rhythm information that could potentially be obtained via home monitoring, at the time of her visit. I reminded Jacqueline Kim to expect a phone call at the time of her visit if her visit.  Did the patient verbally acknowledge consent to treatment? Patient provided verbal consent.   Garey Ham, CMA 09/23/2018 11:16 AM   DOWNLOADING THE WEBEX SOFTWARE TO SMARTPHONE  - If Apple, go to [employer] and type in WebEx in the search bar. Download [employer], the blue/green circle. The app is free but as with any other app downloads, their phone may require them to verify saved payment information or Apple password. The patient does NOT have to create an account.  - If Android, ask patient to go to [employer] and type in WebEx in the search bar. Download [employer], the blue/green circle. The app is free but as with any other app downloads, their phone may require them to verify saved payment information or Android password. The patient does NOT have to create an account.   CONSENT FOR TELE-HEALTH VISIT - PLEASE REVIEW  I hereby voluntarily request, consent and authorize [facility] and its employed or contracted physicians, physician assistants, nurse practitioners or other licensed health care professionals (the Practitioner), to provide me with telemedicine health care services (the "Services") as deemed necessary  by the treating Practitioner. I acknowledge and consent to receive the Services by the Practitioner via telemedicine. I understand that the telemedicine visit will involve communicating with the Practitioner through live audiovisual communication technology and the disclosure of certain medical information by electronic transmission. I acknowledge that I have been given the opportunity to request an in-person assessment or other available alternative prior  to the telemedicine visit and am voluntarily participating in the telemedicine visit.  I understand that I have the right to withhold or withdraw my consent to the use of telemedicine in the course of my care at any time, without affecting my right to future care or treatment, and that the Practitioner or I may terminate the telemedicine visit at any time. I understand that I have the right to inspect all information obtained and/or recorded in the course of the telemedicine visit and may receive copies of available information for a reasonable fee.  I understand that some of the potential risks of receiving the Services via telemedicine include:  Marland Kitchen Delay or interruption in medical evaluation due to technological equipment failure or disruption; . Information transmitted may not be sufficient (e.g. poor resolution of images) to allow for appropriate medical decision making by the Practitioner; and/or  . In rare instances, security protocols could fail, causing a breach of personal health information.  Furthermore, I acknowledge that it is my responsibility to provide information about my medical history, conditions and care that is complete and accurate to the best of my ability. I acknowledge that Practitioner's advice, recommendations, and/or decision may be based on factors not within their control, such as incomplete or inaccurate data provided by me or distortions of diagnostic images or specimens that may result from electronic transmissions. I understand that the practice of medicine is not an exact science and that Practitioner makes no warranties or guarantees regarding treatment outcomes. I acknowledge that I will receive a copy of this consent concurrently upon execution via email to the email address I last provided but may also request a printed copy by calling the office of [facility].    I understand that my insurance will be billed for this visit.   I have read or had this consent read  to me. . I understand the contents of this consent, which adequately explains the benefits and risks of the Services being provided via telemedicine.  . I have been provided ample opportunity to ask questions regarding this consent and the Services and have had my questions answered to my satisfaction. . I give my informed consent for the services to be provided through the use of telemedicine in my medical care  By participating in this telemedicine visit I agree to the above.

## 2018-09-27 ENCOUNTER — Telehealth: Payer: Self-pay | Admitting: Cardiovascular Disease

## 2018-09-27 NOTE — Telephone Encounter (Signed)
No Smart phone/MyChart/pre reg completed 09-27-18 ST Patient agrees to telephone visit on 09-29-18. 09-27-18 ST

## 2018-09-29 ENCOUNTER — Encounter: Payer: Self-pay | Admitting: Cardiovascular Disease

## 2018-09-29 ENCOUNTER — Telehealth (INDEPENDENT_AMBULATORY_CARE_PROVIDER_SITE_OTHER): Payer: Medicare Other | Admitting: Cardiovascular Disease

## 2018-09-29 VITALS — Ht 64.0 in | Wt 133.0 lb

## 2018-09-29 DIAGNOSIS — I480 Paroxysmal atrial fibrillation: Secondary | ICD-10-CM

## 2018-09-29 DIAGNOSIS — G4733 Obstructive sleep apnea (adult) (pediatric): Secondary | ICD-10-CM | POA: Diagnosis not present

## 2018-09-29 DIAGNOSIS — I951 Orthostatic hypotension: Secondary | ICD-10-CM

## 2018-09-29 DIAGNOSIS — Z950 Presence of cardiac pacemaker: Secondary | ICD-10-CM

## 2018-09-29 DIAGNOSIS — I495 Sick sinus syndrome: Secondary | ICD-10-CM | POA: Diagnosis not present

## 2018-09-29 DIAGNOSIS — Z7901 Long term (current) use of anticoagulants: Secondary | ICD-10-CM

## 2018-09-29 MED ORDER — MIDODRINE HCL 2.5 MG PO TABS: 2.5000 mg | ORAL_TABLET | Freq: Two times a day (BID) | ORAL | 3 refills | Status: DC

## 2018-09-29 NOTE — Progress Notes (Signed)
Virtual Visit via Telephone Note   This visit type was conducted due to national recommendations for restrictions regarding the COVID-19 Pandemic (e.g. social distancing) in an effort to limit this patient's exposure and mitigate transmission in our community.  Due to her co-morbid illnesses, this patient is at least at moderate risk for complications without adequate follow up.  This format is felt to be most appropriate for this patient at this time.  The patient did not have access to video technology/had technical difficulties with video requiring transitioning to audio format only (telephone).  All issues noted in this document were discussed and addressed.  No physical exam could be performed with this format.  Please refer to the patient's chart for her  consent to telehealth for [facility].   Evaluation Performed:  Follow-up visit  Date:  09/29/2018   ID:  Jacqueline Kim, DOB [birth year], MRN [id number]  Patient Location: Home Provider Location: Home  PCP:  Merrilee Seashore, MD  Cardiologist:  Shelva Majestic, MD Electrophysiologist:  None   Chief Complaint: 10-monthfollow-up cardiology and sleep evaluation  History of Present Illness:    Jacqueline HUSSEINis a 73y.o. female who has a history of hypertension, PAF, status post permanent pacemaker insertion for sinus node dysfunction, and recent diagnosis of obstructive sleep apnea.  She has seen Dr. ARayann Hemanin the past with reference to her A. fib and she opted against ablation and has continued with medical therapy.  She is followed by Dr. CSallyanne Kusterfor her pacemaker and on her pacemaker evaluation in August 2019 she did have an episode of A. fib lasting 3 minutes which had occurred in May 2019.  When I had seen her recently, she had experienced nocturnal palpitations.  She was sleeping poorly.  I recommended that she undergo a sleep evaluation and a diagnostic polysomnogram from November 2019 showed a near moderate sleep apnea with  an AHI of 13.4 and RDI of 14.6.  However sleep apnea was moderate during REM sleep with an AHI of 22.5.  She had significant oxygen desaturation to 80%.  When I last saw her in the office on June 28, 2018 she had not yet had her CPAP titration.  However, during that evaluation she appeared exceedingly frail.  She had significant orthostatic hypotension and I significantly reduced her medications.  I had reduced her trazodone initially from 150 mg down to 75 mg and then subsequently decrease this to 37.5 mg.  In addition I completely discontinued her furosemide.  She does feel significantly improved.  She again is now wearing support stockings and she denies any orthostatic symptoms.  She underwent her CPAP titration trial on June 30, 2018 and CPAP was titrated up to 14 cm of water with still events.  It was recommended that she initiate CPAP auto therapy with a minimum of 14 up to 20 cm water pressure if needed.  Upon questioning today, her CPAP set up date was August 10, 2018 with [facility].  I was able to obtain a download of her use.  She apparently only initially used it for short-term and of the first month only 9 out of 30 days of use with an average use at 1 hour.  Her 95th percentile pressure with minimal use was 14.3 cm and her AHI remained 4.4/h.  She felt that the mask was causing her to have significant neck difficulties.  As result, she has not used CPAP since.  Presently, she is unaware of episodes  of dizziness.  She does not have a blood pressure monitor at home and cannot take her blood pressure but she feels that she is able to go from sitting to standing position without getting dizzy.  She is unaware of any recurrent episodes of obvious atrial fibrillation.  She denies chest pain.  She denies bleeding on anticoagulation.  She presents for evaluation.  The patient does not have symptoms concerning for COVID-19 infection (fever, chills, cough, or new shortness of breath).      Past Medical History:  Diagnosis Date   Anticoagulant long-term use  WITH PRADAXA 05/27/2011   Anxiety    Cellulitis of buttock, left 14/97/0263   Complication of anesthesia    DIFFICULTY WAKING UP   Depression    Diverticulosis    DJD (degenerative joint disease)    Esophagitis 09/17/06   External hemorrhoids    GERD (gastroesophageal reflux disease)    Glaucoma    History of stress test    a. 2008 - normal nuc.   Hypertension    Hypothyroid    IBS (irritable bowel syndrome)    Paroxysmal A-fib ([place]) diagnosed 2008   a. On propafenone, Pradaxa.   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   s/p MDT PPM by Dr Recardo Evangelist   Past Surgical History:  Procedure Laterality Date   BREAST REDUCTION SURGERY     BUNIONECTOMY Right 07/09/2015   with rods and pins   CARDIOVERSION N/A 08/02/2014   Procedure: CARDIOVERSION;  Surgeon: Sueanne Margarita, MD;  Location: [facility];  Service: Cardiovascular;  Laterality: N/A;   CATARACT EXTRACTION, BILATERAL Bilateral    FACIAL COSMETIC SURGERY     FOOT SURGERY Left    HAND SURGERY     multiple   LIPOSUCTION     NM MYOCAR PERF WALL MOTION  12/29/2006   No significant ischemia; EF 69%   OVARIAN CYST REMOVAL     PACEMAKER INSERTION  04/08/11   MDT Revo implanted by Dr Recardo Evangelist   TONSILLECTOMY     TOTAL KNEE ARTHROPLASTY Left 05/01/2015   Procedure: TOTAL LEFT KNEE ARTHROPLASTY;  Surgeon: Paralee Cancel, MD;  Location: [facility];  Service: Orthopedics;  Laterality: Left;   VAGINAL HYSTERECTOMY       Current Meds  Medication Sig   AMBULATORY NON FORMULARY MEDICATION Medication Name: Nitroglycerin ointment .0125% Pea size amount per rectum three times a day   dicyclomine (BENTYL) 10 MG capsule TAKE ONE CAPSULE 3 TIMES DAILY BEFORE MEALS   diltiazem (CARDIZEM CD) 120 MG 24 hr capsule TAKE ONE CAPSULE BY MOUTH AT BEDTIME   docusate sodium  (COLACE) 100 MG capsule Take 1 capsule (100 mg total) by mouth 2 (two) times daily.   ELIQUIS 5 MG TABS tablet TAKE ONE (1) TABLET BY MOUTH TWO (2) TIMES DAILY   escitalopram (LEXAPRO) 20 MG tablet Take 10 mg by mouth daily.   HYDROcodone-acetaminophen (NORCO/VICODIN) 5-325 MG tablet Take 1/2 to 1 tablet as needed for pain   levothyroxine (SYNTHROID, LEVOTHROID) 112 MCG tablet Take 112 mcg by mouth daily before breakfast.    linaclotide (LINZESS) 72 MCG capsule Take 1 capsule (72 mcg total) by mouth daily before breakfast.   metroNIDAZOLE (METROGEL) 1 % gel Apply topically.   midodrine (PROAMATINE) 2.5 MG tablet Take 1 tablet (2.5 mg total) by mouth 2 (two) times daily with a meal.   propafenone (RYTHMOL) 150 MG tablet TAKE ONE AND ON-HALF TABLETS BY MOUTH EVERY  MORNING, ONE TABLET BY MOUTH MID-DAY, AND ONE AND ONE-HALF TABLETS AT BEDTIME   traZODone (DESYREL) 150 MG tablet Cut the half a tablet in half to make (37.5 mg)   triamcinolone cream (KENALOG) 0.1 % Apply to affected areas on face twice daily   Current Facility-Administered Medications for the 09/29/18 encounter (Telemedicine) with Troy Sine, MD  Medication   0.9 %  sodium chloride infusion     Allergies:   Azithromycin; Epinephrine; Penicillins; Lidocaine; and Metoprolol   Social History   Tobacco Use   Smoking status: Former Smoker    Last attempt to quit: 08/30/1996    Years since quitting: 22.0   Smokeless tobacco: Never Used  Substance Use Topics   Alcohol use: Yes    Alcohol/week: 6.0 standard drinks    Types: 6 Glasses of wine per week    Comment: daily   Drug use: No     Family Hx: The patient's family history includes Anesthesia problems in her mother; Colon polyps in her mother. There is no history of Colon cancer.  ROS:   Please see the history of present illness.    Positive for osteoarthritis and rheumatoid arthritis.  Previous orthostatic symptoms have resolved.  No chest pain.  No  visual changes.  No bleeding on anticoagulation.  She denies abdominal pain.  She believes she is sleeping better and is unaware of snoring.  There are no hypnagogic hallucinations or awareness of restless legs. All other systems reviewed and are negative.   Prior CV studies:   The following studies were reviewed today:  I reviewed her diagnostic sleep study as well as her CPAP titration study in detail with her.  Labs/Other Tests and Data Reviewed:    EKG: I personally reviewed her last ECG from June 28, 2018 which shows AV paced rhythm with prolonged AV conduction, ventricular rate 65, PR interval 248 ms.  No ectopy  Recent Labs: 08/10/2018: Hemoglobin 12.1; Platelets 266   Recent Lipid Panel Lab Results  Component Value Date/Time   CHOL  12/19/2006 03:15 AM    172        ATP III CLASSIFICATION:  <200     mg/dL   Desirable  200-239  mg/dL   Borderline High  >=240    mg/dL   High   TRIG 293 (H) 12/19/2006 03:15 AM   HDL 37 (L) 12/19/2006 03:15 AM   CHOLHDL 4.6 12/19/2006 03:15 AM   LDLCALC  12/19/2006 03:15 AM    76        Total Cholesterol/HDL:CHD Risk Coronary Heart Disease Risk Table                     Men   Women  1/2 Average Risk   3.4   3.3    Wt Readings from Last 3 Encounters:  09/29/18 133 lb (60.3 kg)  06/30/18 135 lb (61.2 kg)  06/28/18 135 lb 12.8 oz (61.6 kg)     Objective:    Vital Signs:  Ht '5\' 4"'  (1.626 m)    Wt 133 lb (60.3 kg)    BMI 22.83 kg/m    She does not have a blood pressure cuff at home.  She denies orthostatic symptoms and notes significant benefit since I reduced her trazodone as well as discontinued furosemide.  Well nourished, well developed female in no acute distress. Compared to her June 28, 2018 evaluation, her voice is much more robust.  She is much more energetic. She continues  to use support stockings with benefit. Her breathing is unlabored. There is no wheezing. She denies any chest pressure or tenderness to touch to  her chest wall.  She denies any bleeding she denies abdominal pain.  There is no swelling in her legs by her report. Her cognition today appears normal with a normal affect, improved from last evaluation.  ASSESSMENT & PLAN:    1. PAF: She appears to be maintaining sinus rhythm.  She has sinus node dysfunction and has been documented to be consistently AV pacing.  Upon taking her pulse her rhythm was regular. 2. Orthostatic hypotension: Clinically she is not symptomatic.  This seems to have improved with significant reduction of her previous trazodone dose as well as discontinuance of furosemide.  She no longer complains of being dizzy.  She has more energy. 3. Obstructive sleep apnea: I had a long discussion with her today.  I again reviewed her diagnostic polysomnogram and reviewed her I tha was in the second half of night and that her sleep apnea was more significant with rem sleep and was associated with that drop in oxygen to a nadir of 80%.  After lengthy discussion, she states that she will resume CPAP therapy effective this evening.  I discussed with her the current Medicare requirements for documentation of compliance within 90 days.  She has 1 month to get compliance t upon rinse of rem sleep occurs.  Titration which occurred after her last office visit.  CPAP was set at a minimum pressure of 14 up to a maximum of 20.  Her CPAP set up date was February 25.  Apparently she only initially used this for several days.  Her initial 1 month download showed 3 of 29 days of usage with average use at 1 hours.  Over the past month she has not used this at all.  She states that she stopped using this because it was causing neck cramps.  I reviewed the mask fitting.  If on properly this should not cause cramping of the neck.  I had a lengthy discussion with her regarding her history of underlying atrial fibrillation and cardiovascular comorbidities.  I discussed the rationale why CPAP therapy is very beneficial  for her and hopefully with resumption of use this can prevent referral recurrent atrial fibrillation and also have additional benefit.  Her sleep apnea was more prominent during REM sleep.  I discussed with her that the preponderance of rem sleep occurs in the second half of the night and that she will need to used therapy for the entire night duration.  I discussed with her compliance standards which need to be met by Medicare within 90 days of CPAP set up.  As result, she will need to become compliant over the next month otherwise she is at risk for losing her equipment.  She agrees to reinitiate therapy effective this evening.  She will contact me if she has issues in attempt that hopefully we can circumvent any potential future noncompliance. 4. Anticoagulation: She continues to be on Eliquis and is tolerating this without bleeding. 5. Pacemaker: She has underlying sick sinus syndrome.  Her last follow-up with [name] was August 2019.  COVID-19 Education: The signs and symptoms of COVID-19 were discussed with the patient and how to seek care for testing (follow up with PCP or arrange E-visit).  The importance of social distancing was discussed today.  Time:   Today, I have spent 25 minutes with the patient with telehealth technology discussing  the above problems.     Medication Adjustments/Labs and Tests Ordered: Current medicines are reviewed at length with the patient today.  Concerns regarding medicines are outlined above.   Tests Ordered: No orders of the defined types were placed in this encounter.   Medication Changes: No orders of the defined types were placed in this encounter.   Disposition:  Follow up 2-3 months  Signed, Shelva Majestic, MD  09/29/2018 2:48 PM    [place]

## 2018-09-29 NOTE — Patient Instructions (Signed)
Medication Instructions:  The current medical regimen is effective;  continue present plan and medications.  If you need a refill on your cardiac medications before your next appointment, please call your pharmacy.    Follow-Up: At [facility], you and your health needs are our priority.  As part of our continuing mission to provide you with exceptional heart care, we have created designated Provider Care Teams.  These Care Teams include your primary Cardiologist (physician) and Advanced Practice Providers (APPs -  Physician Assistants and Nurse Practitioners) who all work together to provide you with the care you need, when you need it. You will need a follow up appointment in 2-3 months. You may see Nicki Guadalajara, MD or one of the following Advanced Practice Providers on your designated Care Team: [place], [place] . Micah Flesher, PA-C

## 2018-11-09 ENCOUNTER — Emergency Department (HOSPITAL_COMMUNITY)
Admission: EM | Admit: 2018-11-09 | Discharge: 2018-11-09 | Discharge status: Against Medical Advice | Payer: Medicare Other | Attending: Emergency Medicine | Admitting: Emergency Medicine

## 2018-11-09 ENCOUNTER — Other Ambulatory Visit: Payer: Self-pay

## 2018-11-09 ENCOUNTER — Encounter (HOSPITAL_COMMUNITY): Payer: Self-pay

## 2018-11-09 DIAGNOSIS — R300 Dysuria: Secondary | ICD-10-CM | POA: Diagnosis present

## 2018-11-09 DIAGNOSIS — Z5321 Procedure and treatment not carried out due to patient leaving prior to being seen by health care provider: Secondary | ICD-10-CM | POA: Insufficient documentation

## 2018-11-09 LAB — CBC
HCT: 32.9 % — ABNORMAL LOW (ref 36.0–46.0)
Hemoglobin: 11 g/dL — ABNORMAL LOW (ref 12.0–15.0)
MCH: 31.2 pg (ref 26.0–34.0)
MCHC: 33.4 g/dL (ref 30.0–36.0)
MCV: 93.2 fL (ref 80.0–100.0)
Platelets: 285 10*3/uL (ref 150–400)
RBC: 3.53 MIL/uL — ABNORMAL LOW (ref 3.87–5.11)
RDW: 12.9 % (ref 11.5–15.5)
WBC: 7.1 10*3/uL (ref 4.0–10.5)
nRBC: 0 % (ref 0.0–0.2)

## 2018-11-09 LAB — BASIC METABOLIC PANEL
Anion gap: 11 (ref 5–15)
BUN: 12 mg/dL (ref 8–23)
CO2: 24 mmol/L (ref 22–32)
Calcium: 9.3 mg/dL (ref 8.9–10.3)
Chloride: 93 mmol/L — ABNORMAL LOW (ref 98–111)
Creatinine, Ser: 0.85 mg/dL (ref 0.44–1.00)
GFR calc Af Amer: >60 mL/min (ref >60)
GFR calc non Af Amer: >60 mL/min (ref >60)
Glucose, Bld: 112 mg/dL — ABNORMAL HIGH (ref 70–99)
Potassium: 4.5 mmol/L (ref 3.5–5.1)
Sodium: 128 mmol/L — ABNORMAL LOW (ref 135–145)

## 2018-11-09 NOTE — ED Triage Notes (Signed)
Pt arrives POV for eval of decreased urinary output x1.5 days. Pt states she feels as though she needs to go but is unable to produce any urine. Denies hematuria. Pt also reports frequent falls because her "knees keep buckling out from her". Denies dizzines, SOB, chest pain, N/V or any symptoms presyncope. States she has many back problems and "feels as though they're just moving down" Pt is anticoagulated on eliquis, states she has not struck head during falls "I make sure I protect it because of the blood thinner". GCS 15, no acute neurological deficits in triage. Denies injury or trauma from falls.

## 2018-11-09 NOTE — ED Notes (Addendum)
Pt's dtr Trula Ore [phone]. Pls contact with updates

## 2018-11-09 NOTE — ED Notes (Addendum)
Pt LAMA. RN advised Pt to stay to be seen. Pt states she does not want to continue to wait. RN informed Pt to call 9-1-1 if  sxs worsens. Pt DTR took Pt home

## 2018-12-02 ENCOUNTER — Other Ambulatory Visit: Payer: Self-pay | Admitting: Obstetrics & Gynecology

## 2018-12-02 DIAGNOSIS — Z1231 Encounter for screening mammogram for malignant neoplasm of breast: Secondary | ICD-10-CM

## 2018-12-22 ENCOUNTER — Other Ambulatory Visit: Payer: Self-pay | Admitting: Internal Medicine

## 2018-12-22 DIAGNOSIS — R634 Abnormal weight loss: Secondary | ICD-10-CM

## 2018-12-23 ENCOUNTER — Other Ambulatory Visit: Payer: Self-pay | Admitting: Cardiovascular Disease

## 2018-12-23 NOTE — Telephone Encounter (Signed)
Age 73, weight 56.7kg, SCr 0.85 on 11/09/18, afib indication, last OV 09/2018

## 2018-12-24 ENCOUNTER — Other Ambulatory Visit: Payer: Self-pay | Admitting: Cardiovascular Disease

## 2018-12-27 ENCOUNTER — Ambulatory Visit (INDEPENDENT_AMBULATORY_CARE_PROVIDER_SITE_OTHER): Payer: Medicare Other | Admitting: Neurology

## 2018-12-27 ENCOUNTER — Ambulatory Visit
Admission: RE | Admit: 2018-12-27 | Discharge: 2018-12-27 | Disposition: A | Discharge status: Home / Self Care | Payer: Medicare Other | Source: Ambulatory Visit | Attending: Internal Medicine | Admitting: Internal Medicine

## 2018-12-27 ENCOUNTER — Other Ambulatory Visit: Payer: Self-pay

## 2018-12-27 ENCOUNTER — Encounter: Payer: Self-pay | Admitting: Neurology

## 2018-12-27 VITALS — BP 99/70 | HR 73

## 2018-12-27 DIAGNOSIS — R269 Unspecified abnormalities of gait and mobility: Secondary | ICD-10-CM | POA: Diagnosis not present

## 2018-12-27 DIAGNOSIS — R2689 Other abnormalities of gait and mobility: Secondary | ICD-10-CM | POA: Diagnosis not present

## 2018-12-27 DIAGNOSIS — G629 Polyneuropathy, unspecified: Secondary | ICD-10-CM

## 2018-12-27 DIAGNOSIS — R634 Abnormal weight loss: Secondary | ICD-10-CM

## 2018-12-27 MED ORDER — IOPAMIDOL (ISOVUE-300) INJECTION 61%
80.0000 mL | Freq: Once | INTRAVENOUS | Status: AC | PRN
  Administered 2018-12-27: 80 mL via INTRAVENOUS

## 2018-12-27 NOTE — Progress Notes (Signed)
Subjective:    Patient ID: Jacqueline Kim is a 73 y.o. female.  HPI     Star Age, MD, PhD [facility] [address] [place], [place] [address]  Dear Dr. Ashby Dawes, I saw your patient, Jacqueline Kim, upon your kind request in my neurologic clinic today for initial consultation of her leg weakness.  The patient is accompanied by her daughter, [name], today.  As you know, [name] is a 73 year old right-handed woman with an underlying complex medical history of arthritis, degenerative spine disease, hypertension, A. fib, hypothyroidism, history of CHF, reflux disease, depression, glaucoma, anxiety, depression, irritable bowel syndrome, status post pacemaker placement and hyponatremia, who reports A longer standing history of gait and balance problems and recurrent falls, worse in the past 8 weeks. I reviewed your office note from 11/18/2018.  She has had recurrent falls.  Of note, she is on multiple medications including Eliquis, diltiazem, furosemide, Plaquenil, trazodone, midodrine, hydrocodone, Voltaren, atenolol, propafenone.  She had blood work through your office, CRP was elevated at 35, magnesium was 1.6 which is slightly low, aldolase was within limits, CMP showed sodium of 129, AST of 54, ALT 39, CK level was 200, rheumatoid factor 25.7, ESR 13, TSH 1.31, ANA negative, CCP antibodies negative she has been followed by orthopedics and neurosurgery.  She has had progressive lower extremity weakness.  There has been concern that the patient is drinking alcohol excessively, up to 1-1/2 L of wine per day.  She has been using a walker.  She is status post left knee replacement surgery.  She has been seeing Dr. Brien Few for pain management.  She had an EMG and nerve conduction study through [facility].  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  She had a CT lumbar spine without contrast on 08/20/2017 due to  progressive lower extremity weakness and pain reported, this was ordered by Dr. Nelva Bush, and I reviewed the results: IMPRESSION: Severe convex right scoliosis.   Moderate to moderately severe central canal and bilateral subarticular recess narrowing at L3-4 where there is a disc bulge and advanced bilateral facet degenerative change. Mild left foraminal narrowing is also present at this level.   Moderate to moderately severe central canal stenosis at L4-5. The foramina appear open.   Partial autologous fusion across the disc interspace at L1-2.   Disc and endplate spur at J0-9 cause narrowing in the left lateral recess which could impact the descending left L3 root.  She had a CT cervical spine and lumbar spine CT with contrast on 10/09/2017 as ordered by Dr. Sherley Bounds and I reviewed the results: IMPRESSION: 1. Chronic degenerative disc disease and disc osteophyte complex in the cervical spine. 2. Moderate to severe left foraminal stenosis at C3-4. 3. Moderate central and bilateral foraminal stenosis at C4-5 and C5-6. 4. Grade 1 anterolisthesis at C4-5 with a prominent soft disc protrusion. 5. Mild central and bilateral foraminal narrowing at C6-7. 6. Lumbar scoliosis. 7. Mild left subarticular narrowing at L2-3. 8. Moderate right and mild left subarticular and foraminal stenosis is present at L3-4. 9. Mild subarticular and foraminal narrowing bilaterally at L4-5.  She had a head CT without contrast on 05/15/2018 after a fall and I reviewed the results: MPRESSION: 1. No acute intracranial abnormalities. 2. Right frontal scalp hematoma.  No skull fracture.  Per daughter, she has lost quite a bit of weight, she moved from a apartment to a ground-floor apartment last year.  Patient reports that  she had lost her appetite, she has weighed 132 pounds at your office on 11/18/2018.  She had repeat labs through your office on 12/16/2018 and per daughter her sodium level and liver enzyme was  better.  She fell about 3 to 4 weeks ago, she has been losing weight over the past 6 months at least.  She admits to drinking heavily for several years, she has a family history of alcoholism on her mother's side, she admits that she had been drinking wine excessively.  She reports that she has quit drinking alcohol about 2 weeks ago.  She lives alone.  She has 3 children, her 2 daughters live about 5 minutes away, her son lives in [place].  She had left knee replacement surgery in 2016.  She sees rheumatology, Dr. Amil Amen.  She quit smoking some 20 years ago.  She is in physical therapy currently, goes twice a week.  She has a rolling walker with seat. She denies a family history of neuropathy but reports a family history of arthritis.  Her Past Medical History Is Significant For: Past Medical History:  Diagnosis Date  . Anticoagulant long-term use  WITH PRADAXA 05/27/2011  . Anxiety   . Cellulitis of buttock, left 12/20/2014  . Complication of anesthesia    DIFFICULTY WAKING UP  . Depression   . Diverticulosis   . DJD (degenerative joint disease)   . Esophagitis 09/17/06  . External hemorrhoids   . GERD (gastroesophageal reflux disease)   . Glaucoma   . History of stress test    a. 2008 - normal nuc.  Marland Kitchen Hypertension   . Hypothyroid   . IBS (irritable bowel syndrome)   . Paroxysmal A-fib [facility]) diagnosed 2008   a. On propafenone, Pradaxa.  . 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 BREAST REDUCTION SURGERY    . BUNIONECTOMY Right 07/09/2015   with rods and pins  . CARDIOVERSION N/A 08/02/2014   Procedure: CARDIOVERSION;  Surgeon: Sueanne Margarita, MD;  Location: [facility];  Service: Cardiovascular;  Laterality: N/A;  . CATARACT EXTRACTION, BILATERAL Bilateral   . FACIAL COSMETIC  SURGERY    . FOOT SURGERY Left   . HAND SURGERY     multiple  . LIPOSUCTION    . NM MYOCAR PERF WALL MOTION  12/29/2006   No significant ischemia; EF 69%  . OVARIAN CYST REMOVAL    . PACEMAKER INSERTION  04/08/11   MDT Revo implanted by Dr Recardo Evangelist  . TONSILLECTOMY    . TOTAL KNEE ARTHROPLASTY Left 05/01/2015   Procedure: TOTAL LEFT KNEE ARTHROPLASTY;  Surgeon: Paralee Cancel, MD;  Location: [facility];  Service: Orthopedics;  Laterality: Left;  Marland Kitchen VAGINAL HYSTERECTOMY      Her Family History Is Significant For: Family History  Problem Relation Age of Onset  . Anesthesia problems Mother   . Colon polyps Mother        beign  . Colon cancer Neg Hx     Her Social History Is Significant For: Social History   Socioeconomic History  . Marital status: Divorced    Spouse name: Not on file  . Number of children: 3  . Years of education: Not on file  . Highest education level: Not on file  Occupational History  . Occupation: retired  Employer: RETIRED  Social Needs  . Financial resource strain: Not on file  . Food insecurity    Worry: Not on file    Inability: Not on file  . Transportation needs    Medical: Not on file    Non-medical: Not on file  Tobacco Use  . Smoking status: Former Smoker    Quit date: 08/30/1996    Years since quitting: 22.3  . Smokeless tobacco: Never Used  Substance and Sexual Activity  . Alcohol use: Yes    Alcohol/week: 6.0 standard drinks    Types: 6 Glasses of wine per week    Comment: daily  . Drug use: No  . Sexual activity: Yes  Lifestyle  . Physical activity    Days per week: Not on file    Minutes per session: Not on file  . Stress: Not on file  Relationships  . Social Herbalist on phone: Not on file    Gets together: Not on file    Attends religious service: Not on file    Active member of club or organization: Not on file    Attends meetings of clubs or organizations: Not on file    Relationship status: Not on file   Other Topics Concern  . Not on file  Social History Narrative   Lives in [place],  Former [employer],  Separated.      Her Allergies Are:  Allergies  Allergen Reactions  . Azithromycin Other (See Comments)    Causes heart rhythm issues Other reaction(s): Other (See Comments) Causes heart rhythm issues  . Epinephrine Other (See Comments)    Causes A-Fib Other reaction(s): Increased Heart Rate (intolerance) States that it caused her heart to beat fast, so she will not take it. Causes A-Fib A-fib Triggers atrial fib   . Penicillins Anaphylaxis    Has patient had a PCN reaction causing immediate rash, facial/tongue/throat swelling, SOB or lightheadedness with hypotension: Yes Has patient had a PCN reaction causing severe rash involving mucus membranes or skin necrosis: No Has patient had a PCN reaction that required hospitalization No Has patient had a PCN reaction occurring within the last 10 years: No If all of the above answers are "NO", then may proceed with Cephalosporin use.   . Lidocaine Palpitations    Or any similar medication Other reaction(s): Other (See Comments) Or any similar medication  . Metoprolol Other (See Comments)    alopecia Other reaction(s): Other (See Comments) alopecia  :   Her Current Medications Are:  Outpatient Encounter Medications as of 12/27/2018  Medication Sig  . AMBULATORY NON FORMULARY MEDICATION Medication Name: Nitroglycerin ointment .0125% Pea size amount per rectum three times a day  . atenolol (TENORMIN) 50 MG tablet Take 50 mg by mouth daily.  Marland Kitchen dicyclomine (BENTYL) 10 MG capsule TAKE ONE CAPSULE 3 TIMES DAILY BEFORE MEALS  . diltiazem (CARDIZEM CD) 120 MG 24 hr capsule TAKE ONE CAPSULE BY MOUTH EVERY NIGHT AT BEDTIME  . docusate sodium (COLACE) 100 MG capsule Take 1 capsule (100 mg total) by mouth 2 (two) times daily.  Marland Kitchen ELIQUIS 5 MG TABS tablet TAKE ONE (1) TABLET BY MOUTH TWO (2) TIMES DAILY  . escitalopram  (LEXAPRO) 20 MG tablet Take 10 mg by mouth daily.  Marland Kitchen estradiol (ESTRACE) 0.5 MG tablet Take 0.5 mg by mouth daily.  . furosemide (LASIX) 20 MG tablet Take 20 mg by mouth daily.  Marland Kitchen HYDROcodone-acetaminophen (NORCO) 7.5-325 MG tablet Take 1 tablet by  mouth every 6 (six) hours as needed for moderate pain.  . hydroxychloroquine (PLAQUENIL) 200 MG tablet Take 200 mg by mouth daily.  Marland Kitchen levothyroxine (SYNTHROID, LEVOTHROID) 112 MCG tablet Take 112 mcg by mouth daily before breakfast.   . linaclotide (LINZESS) 72 MCG capsule Take 1 capsule (72 mcg total) by mouth daily before breakfast.  . losartan (COZAAR) 25 MG tablet Take 25 mg by mouth 2 (two) times a day.  . metroNIDAZOLE (METROGEL) 1 % gel Apply topically.  . midodrine (PROAMATINE) 2.5 MG tablet Take 1 tablet (2.5 mg total) by mouth 2 (two) times daily with a meal.  . propafenone (RYTHMOL) 150 MG tablet TAKE ONE AND ON-HALF TABLETS BY MOUTH EVERY MORNING, ONE TABLET BY MOUTH MID-DAY, AND ONE AND ONE-HALF TABLETS AT BEDTIME  . traZODone (DESYREL) 150 MG tablet Cut the half a tablet in half to make (37.5 mg)  . triamcinolone cream (KENALOG) 0.1 % Apply to affected areas on face twice daily   Facility-Administered Encounter Medications as of 12/27/2018  Medication  . 0.9 %  sodium chloride infusion  :   Review of Systems:  Out of a complete 14 point review of systems, all are reviewed and negative with the exception of these symptoms as listed below:  Review of Systems  Neurological:       Pt presents today to discuss her decrease in mobility over the past 8 weeks.    Objective:  Neurological Exam  Physical Exam Physical Examination:   Vitals:   12/27/18 1444  BP: 99/70  Pulse: 73   General Examination: The patient is a very pleasant 73 y.o. female in no acute distress, she is thin and frail appearing. Well groomed.   HEENT: Normocephalic, atraumatic, pupils are equal, round and reactive to light and accommodation. Extraocular  tracking is good without limitation to gaze excursion or nystagmus noted. Normal smooth pursuit is noted. Hearing is grossly intact. Face is symmetric with normal facial animation and normal facial sensation. Speech is clear with no dysarthria noted. There is no hypophonia. There is no lip, neck/head, jaw or voice tremor. Neck is supple with full range of passive and active motion. There are no carotid bruits on auscultation. Oropharynx exam reveals: moderate mouth dryness, adequate dental hygiene. Tongue protrudes centrally and palate elevates symmetrically.    Chest: Clear to auscultation without wheezing, rhonchi or crackles noted.  Heart: S1+S2+0, regular and normal without murmurs, rubs or gallops noted.   Abdomen: Soft, non-tender and non-distended with normal bowel sounds appreciated on auscultation.   Extremities: There is no pitting edema in the distal lower extremities bilaterally. Pedal pulses are intact.  Skin: Warm and dry without trophic changes noted.  Musculoskeletal: exam reveals no obvious joint deformities, tenderness or joint swelling or erythema.   Neurologically:  Mental status: The patient is awake, alert and oriented in all 4 spheres. Her immediate and remote memory, attention, language skills and fund of knowledge are appropriate. There is no evidence of aphasia, agnosia, apraxia or anomia. Speech is clear with normal prosody and enunciation. Thought process is linear. Mood is normal and affect is normal.  Cranial nerves II - XII are as described above under HEENT exam. In addition: shoulder shrug is normal with equal shoulder height noted. Motor exam: think bulk with global muscle thinning, no focal atrophy, no fasciculations noted. Tone is normal, she has global weakness of 4 out of 5, slightly weaker in the left hip flexor and left foot extension, also slightly weaker in both  upper extremities with elbow extension/tricept. There is no drift, tremor or rebound. Romberg is  not tested d/t to instability. Reflexes are 1+ in the UEs, trace in the Knees and absent in the ankles. Toes are flexor bilaterally. Fine motor skills and coordination: mildly impaired grossly.  Cerebellar testing: No dysmetria or intention tremor, heel to shin not possible. There is no truncal or gait ataxia.  Sensory exam: Fairly intact to all modalities in the upper extremities, and the lower extremities she has lower sensation to all modalities tested including pinprick, temperature and vibration in the feet, up to the ankles bilaterally.  Gait, station and balance: She stands with difficulty. She needs assistance, she stands wide-based, she can walk with a rolling walker briefly, but is walking with a wider base gait and insecurely.   Assessment and Plan:  Assessment and Plan:  In summary, ILLYANA SCHORSCH is a very pleasant 73 y.o.-year old female with an underlying complex medical history of arthritis, degenerative spine disease, hypertension, A. fib, hypothyroidism, history of CHF, reflux disease, depression, glaucoma, anxiety, depression, irritable bowel syndrome, status post pacemaker placement and hyponatremia, who reports A longer standing history of gait and balance problems and recurrent falls, worse in the past 8 weeks. Her gait and balance problems are likely multifactorial in nature.  She does have a history and examination in keeping with neuropathy, she is advised that alcohol can be a toxin to the brain and nervous system and can indeed cause neuropathy.  In addition, she has significant arthritis, she takes multiple medications including potentially sedating medications and has been more deconditioned.  Muscular deconditioning is likely also at play.  She has a complicated history.  I would like to go ahead with EMG and nerve conduction testing through our office.  She had extensive blood work recently, CK level was normal.  Per daughter, her sodium level and liver enzymes improved and the  most recent blood test at your office earlier this month which is reassuring.  We talked about the importance of fall safety, supervision, she is encouraged to talk to her family about a longer term living situation such as assisted living.  Of note, she has not been driving and is advised that I Recommend she no longer drive.  She is advised to talk to you about further evaluation of her weight loss but in the interim she is encouraged to add more nutrition in the form of protein milkshakes to her diet such as boost or Ensure if possible. Of note, she had a CT abdomen and pelvis today with and without contrast which was reported as showing no acute findings. She is encouraged to fully abstain from alcohol use, she is encouraged to stay well-hydrated, change her positions slowly, and use her walker at all times, continue with physical therapy.  I am not sure that I can add a whole lot at this time but I will request EMG and nerve conduction testing through our office and we will keep him posted as to the results and take it from there as well.  I answered all their questions today and the patient and her daughter were in agreement.   Thank you very much for allowing me to participate in the care of this nice patient. If I can be of any further assistance to you please do not hesitate to call me at [phone].  Sincerely,   Star Age, MD, PhD

## 2018-12-27 NOTE — Patient Instructions (Signed)
I believe you have a multifactorial gait disorder, meaning, that it is [place] Quaker is due to a combination of factors. These factors include: normal aging, degenerative arthritis, weight loss, deconditioning and neuropathy.   Please remember to stand up slowly and get your bearings first turn slowly, no bending down to pick anything, no heavy lifting, be extra careful at night and first thing in the morning. Also, be careful in the Bathroom and the kitchen. Use your walker at all times and continue with physical therapy.  Please work on adding more nutrition to your diet by adding boost or Ensure or some other protein milkshake.   I am not sure that I can add a whole lot, I would like however to repeat your EMG and nerve conduction test to recheck for signs of neuropathy. We will call you with the results.   Remember to drink plenty of fluid, eat healthy meals and do not skip any meals. Try to eat protein with a every meal and eat a healthy snack such as fruit or nuts or yogurt in between meals. Try to keep a regular sleep-wake schedule and try to exercise daily.   Please think about a longer term solution for your living situation, such as assisted living which would provide a more secure living environment, more supervision, help with getting regular meals and getting your medicines on time etc.  Please talk to your kids about this.    As far as your medications are concerned, I would like to suggest no new medications.

## 2018-12-28 ENCOUNTER — Telehealth: Payer: Self-pay

## 2018-12-28 NOTE — Telephone Encounter (Signed)
Called patient, advised that visit for Dr.Kelly on Thursday 07/16 was a virtual visit.  Patient verbalized understanding.   Telephone visit preferred, number in chart.

## 2018-12-29 ENCOUNTER — Ambulatory Visit
Admission: RE | Admit: 2018-12-29 | Discharge: 2018-12-29 | Disposition: A | Discharge status: Home / Self Care | Payer: Medicare Other | Source: Ambulatory Visit | Attending: Obstetrics & Gynecology | Admitting: Obstetrics & Gynecology

## 2018-12-29 ENCOUNTER — Other Ambulatory Visit: Payer: Self-pay

## 2018-12-29 DIAGNOSIS — Z1231 Encounter for screening mammogram for malignant neoplasm of breast: Secondary | ICD-10-CM

## 2018-12-30 ENCOUNTER — Telehealth (INDEPENDENT_AMBULATORY_CARE_PROVIDER_SITE_OTHER): Payer: Medicare Other | Admitting: Cardiovascular Disease

## 2018-12-30 ENCOUNTER — Telehealth: Payer: Self-pay | Admitting: Cardiovascular Disease

## 2018-12-30 ENCOUNTER — Encounter: Payer: Self-pay | Admitting: Cardiovascular Disease

## 2018-12-30 VITALS — BP 90/50 | HR 68 | Ht 64.0 in | Wt 119.0 lb

## 2018-12-30 DIAGNOSIS — I480 Paroxysmal atrial fibrillation: Secondary | ICD-10-CM | POA: Diagnosis not present

## 2018-12-30 DIAGNOSIS — I495 Sick sinus syndrome: Secondary | ICD-10-CM

## 2018-12-30 DIAGNOSIS — G4733 Obstructive sleep apnea (adult) (pediatric): Secondary | ICD-10-CM

## 2018-12-30 DIAGNOSIS — Z950 Presence of cardiac pacemaker: Secondary | ICD-10-CM | POA: Diagnosis not present

## 2018-12-30 DIAGNOSIS — Z7901 Long term (current) use of anticoagulants: Secondary | ICD-10-CM

## 2018-12-30 DIAGNOSIS — I951 Orthostatic hypotension: Secondary | ICD-10-CM

## 2018-12-30 MED ORDER — MIDODRINE HCL 2.5 MG PO TABS: 2.5000 mg | ORAL_TABLET | Freq: Three times a day (TID) | ORAL | 3 refills | Status: DC

## 2018-12-30 MED ORDER — ATENOLOL 25 MG PO TABS: 25.0000 mg | ORAL_TABLET | Freq: Two times a day (BID) | ORAL | 1 refills | Status: DC

## 2018-12-30 NOTE — Telephone Encounter (Signed)
I called pt to confirm appt with Dr Claiborne Billings on 12-30-18.      1. Confirm consent - "In the setting of the current Covid19 crisis, you are scheduled for a (phone or video) visit with your provider on (date) at (time).  Just as we do with many in-office visits, in order for you to participate in this visit, we must obtain consent.  If you'd like, I can send this to your mychart (if signed up) or email for you to review.  Otherwise, I can obtain your verbal consent now.  All virtual visits are billed to your insurance company just like a normal visit would be.  By agreeing to a virtual visit, we'd like you to understand that the technology does not allow for your provider to perform an examination, and thus may limit your provider's ability to fully assess your condition. If your provider identifies any concerns that need to be evaluated in person, we will make arrangements to do so.  Finally, though the technology is pretty good, we cannot assure that it will always work on either your or our end, and in the setting of a video visit, we may have to convert it to a phone-only visit.  In either situation, we cannot ensure that we have a secure connection.  Are you willing to proceed?" STAFF: Did the patient verbally acknowledge consent to telehealth visit? Document YES/NO here: Yes  .     FULL LENGTH CONSENT FOR TELE-HEALTH VISIT   I hereby voluntarily request, consent and authorize [facility] and its employed or contracted physicians, physician assistants, nurse practitioners or other licensed health care professionals (the Practitioner), to provide me with telemedicine health care services (the Services") as deemed necessary by the treating Practitioner. I acknowledge and consent to receive the Services by the Practitioner via telemedicine. I understand that the telemedicine visit will involve communicating with the Practitioner through live audiovisual communication technology and the disclosure of  certain medical information by electronic transmission. I acknowledge that I have been given the opportunity to request an in-person assessment or other available alternative prior to the telemedicine visit and am voluntarily participating in the telemedicine visit.  I understand that I have the right to withhold or withdraw my consent to the use of telemedicine in the course of my care at any time, without affecting my right to future care or treatment, and that the Practitioner or I may terminate the telemedicine visit at any time. I understand that I have the right to inspect all information obtained and/or recorded in the course of the telemedicine visit and may receive copies of available information for a reasonable fee.  I understand that some of the potential risks of receiving the Services via telemedicine include:   Delay or interruption in medical evaluation due to technological equipment failure or disruption;  Information transmitted may not be sufficient (e.g. poor resolution of images) to allow for appropriate medical decision making by the Practitioner; and/or   In rare instances, security protocols could fail, causing a breach of personal health information.  Furthermore, I acknowledge that it is my responsibility to provide information about my medical history, conditions and care that is complete and accurate to the best of my ability. I acknowledge that Practitioner's advice, recommendations, and/or decision may be based on factors not within their control, such as incomplete or inaccurate data provided by me or distortions of diagnostic images or specimens that may result from electronic transmissions. I understand that the practice of  medicine is not an Chief Strategy Officer and that Practitioner makes no warranties or guarantees regarding treatment outcomes. I acknowledge that I will receive a copy of this consent concurrently upon execution via email to the email address I last provided but  may also request a printed copy by calling the office of [facility].    I understand that my insurance will be billed for this visit.   I have read or had this consent read to me.  I understand the contents of this consent, which adequately explains the benefits and risks of the Services being provided via telemedicine.   I have been provided ample opportunity to ask questions regarding this consent and the Services and have had my questions answered to my satisfaction.  I give my informed consent for the services to be provided through the use of telemedicine in my medical care  By participating in this telemedicine visit I agree to the above.

## 2018-12-30 NOTE — Patient Instructions (Addendum)
Medication Instructions:  Increase Midodrine to three times a day. Take Atenolol 25 mg twice daily.  If you need a refill on your cardiac medications before your next appointment, please call your pharmacy.   Follow-Up: At [facility], you and your health needs are our priority.  As part of our continuing mission to provide you with exceptional heart care, we have created designated Provider Care Teams.  These Care Teams include your primary Cardiologist (physician) and Advanced Practice Providers (APPs -  Physician Assistants and Nurse Practitioners) who all work together to provide you with the care you need, when you need it. . Follow up with Dr.Croitoru in August. . Follow up with Dr.Kelly in 5-6 months.

## 2018-12-30 NOTE — Progress Notes (Signed)
Virtual Visit via Telephone Note   This visit type was conducted due to national recommendations for restrictions regarding the COVID-19 Pandemic (e.g. social distancing) in an effort to limit this patient's exposure and mitigate transmission in our community.  Due to her co-morbid illnesses, this patient is at least at moderate risk for complications without adequate follow up.  This format is felt to be most appropriate for this patient at this time.  The patient did not have access to video technology/had technical difficulties with video requiring transitioning to audio format only (telephone).  All issues noted in this document were discussed and addressed.  No physical exam could be performed with this format.  Please refer to the patient's chart for her  consent to telehealth for [facility].   Date:  12/30/2018   ID:  [name], DOB [birth year], MRN [id number]  Patient Location: Home Provider Location: Home  PCP:  Georgianne Fick, MD  Cardiologist:  Nicki Guadalajara, MD  Electrophysiologist:  None   Evaluation Performed:  Follow-Up Visit  Chief Complaint:  3 month F/U  History of Present Illness:    Jacqueline Kim is a 73 y.o. female who has a history of hypertension, PAF, status post permanent pacemaker insertion for sinus node dysfunction, and recent diagnosis of obstructive sleep apnea.  She has seen Dr. Johney Frame in the past with reference to her A. fib and she opted against ablation and has continued with medical therapy.  She is followed by Dr. Royann Shivers for her pacemaker and on her pacemaker evaluation in August 2019 she did have an episode of A. fib lasting 3 minutes which had occurred in May 2019.  When I had seen her recently, she had experienced nocturnal palpitations.  She was sleeping poorly.  I recommended that she undergo a sleep evaluation and a diagnostic polysomnogram from November 2019 showed a near moderate sleep apnea with an AHI of 13.4 and RDI of 14.6.   However sleep apnea was moderate during REM sleep with an AHI of 22.5.  She had significant oxygen desaturation to 80%.  When I last saw her in the office on June 28, 2018 she had not yet had her CPAP titration.  However, during that evaluation she appeared exceedingly frail.  She had significant orthostatic hypotension and I significantly reduced her medications.  I had reduced her trazodone initially from 150 mg down to 75 mg and then subsequently decrease this to 37.5 mg.  In addition I completely discontinued her furosemide.  She does feel significantly improved.  She again is now wearing support stockings and she denies any orthostatic symptoms.  She underwent her CPAP titration trial on June 30, 2018 and CPAP was titrated up to 14 cm of water with still events.  It was recommended that she initiate CPAP auto therapy with a minimum of 14 up to 20 cm water pressure if needed.  I last evaluated her by a telemedicine visit in April 2020.  Her CPAP set up date was August 10, 2018 with [facility].  I was able to obtain a download of her use.  She apparently only initially used it for short-term and of the first month only 9 out of 30 days of use with an average use at 1 hour.  Her 95th percentile pressure with minimal use was 14.3 cm and her AHI remained 4.4/h.  She felt that the mask was causing her to have significant neck difficulties.  As result, she has not used CPAP  since. At the time of that evaluation she was unaware of episodes of dizziness.  She does not have a blood pressure monitor at home and cannot take her blood pressure but she feels that she is able to go from sitting to standing position without getting dizzy.  She wasunaware of any recurrent episodes of obvious atrial fibrillation.  She denies chest pain.  She denies bleeding on anticoagulation.  Since I saw her, she has continued to have issues with low blood pressure.  Apparently she was evaluated by Dr. Rexene Alberts for her  sensory polyneuropathy and lower extremity weakness.  He will be undergoing EMG and nerve conduction studies.  There was concerned that she was drinking wine in excess.  Since I last evaluated her, she has not been using CPAP.  She denies any awareness of recurrent atrial fibrillation.  Her blood pressure has tended to be low.  She presents for evaluation.  The patient does not have symptoms concerning for COVID-19 infection (fever, chills, cough, or new shortness of breath).    Past Medical History:  Diagnosis Date   Anticoagulant long-term use  WITH PRADAXA 05/27/2011   Anxiety    Cellulitis of buttock, left 40/01/6760   Complication of anesthesia    DIFFICULTY WAKING UP   Depression    Diverticulosis    DJD (degenerative joint disease)    Esophagitis 09/17/06   External hemorrhoids    GERD (gastroesophageal reflux disease)    Glaucoma    History of stress test    a. 2008 - normal nuc.   Hypertension    Hypothyroid    IBS (irritable bowel syndrome)    Paroxysmal A-fib ([place]) diagnosed 2008   a. On propafenone, Pradaxa.   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   s/p MDT PPM by Dr Recardo Evangelist   Past Surgical History:  Procedure Laterality Date   BREAST REDUCTION SURGERY     BUNIONECTOMY Right 07/09/2015   with rods and pins   CARDIOVERSION N/A 08/02/2014   Procedure: CARDIOVERSION;  Surgeon: Sueanne Margarita, MD;  Location: [facility];  Service: Cardiovascular;  Laterality: N/A;   CATARACT EXTRACTION, BILATERAL Bilateral    FACIAL COSMETIC SURGERY     FOOT SURGERY Left    HAND SURGERY     multiple   LIPOSUCTION     NM MYOCAR PERF WALL MOTION  12/29/2006   No significant ischemia; EF 69%   OVARIAN CYST REMOVAL     PACEMAKER INSERTION  04/08/11   MDT Revo implanted by Dr Recardo Evangelist   TONSILLECTOMY     TOTAL KNEE ARTHROPLASTY Left 05/01/2015   Procedure: TOTAL LEFT KNEE  ARTHROPLASTY;  Surgeon: Paralee Cancel, MD;  Location: [facility];  Service: Orthopedics;  Laterality: Left;   VAGINAL HYSTERECTOMY       Current Meds  Medication Sig   AMBULATORY NON FORMULARY MEDICATION Medication Name: Nitroglycerin ointment .0125% Pea size amount per rectum three times a day   atenolol (TENORMIN) 50 MG tablet Take 50 mg by mouth daily.   dicyclomine (BENTYL) 10 MG capsule TAKE ONE CAPSULE 3 TIMES DAILY BEFORE MEALS   diltiazem (CARDIZEM CD) 120 MG 24 hr capsule TAKE ONE CAPSULE BY MOUTH EVERY NIGHT AT BEDTIME   docusate sodium (COLACE) 100 MG capsule Take 1 capsule (100 mg total) by mouth 2 (two) times daily.   ELIQUIS 5 MG TABS tablet TAKE ONE (1) TABLET BY MOUTH TWO (2) TIMES DAILY   escitalopram (  LEXAPRO) 20 MG tablet Take 10 mg by mouth daily.   estradiol (ESTRACE) 0.5 MG tablet Take 0.5 mg by mouth daily.   furosemide (LASIX) 20 MG tablet Take 20 mg by mouth daily.   HYDROcodone-acetaminophen (NORCO) 7.5-325 MG tablet Take 1 tablet by mouth every 6 (six) hours as needed for moderate pain.   hydroxychloroquine (PLAQUENIL) 200 MG tablet Take 200 mg by mouth daily.   levothyroxine (SYNTHROID, LEVOTHROID) 112 MCG tablet Take 112 mcg by mouth daily before breakfast.    linaclotide (LINZESS) 72 MCG capsule Take 1 capsule (72 mcg total) by mouth daily before breakfast.   losartan (COZAAR) 25 MG tablet Take 25 mg by mouth 2 (two) times a day.   metroNIDAZOLE (METROGEL) 1 % gel Apply topically.   midodrine (PROAMATINE) 2.5 MG tablet Take 1 tablet (2.5 mg total) by mouth 2 (two) times daily with a meal.   propafenone (RYTHMOL) 150 MG tablet TAKE ONE AND ON-HALF TABLETS BY MOUTH EVERY MORNING, ONE TABLET BY MOUTH MID-DAY, AND ONE AND ONE-HALF TABLETS AT BEDTIME   traZODone (DESYREL) 150 MG tablet Cut the half a tablet in half to make (37.5 mg)   triamcinolone cream (KENALOG) 0.1 % Apply to affected areas on face twice daily   Current Facility-Administered  Medications for the 12/30/18 encounter (Telemedicine) with Lennette BihariKelly, Tiffane Sheldon A, MD  Medication   0.9 %  sodium chloride infusion     Allergies:   Azithromycin, Epinephrine, Penicillins, Lidocaine, and Metoprolol   Social History   Tobacco Use   Smoking status: Former Smoker    Quit date: 08/30/1996    Years since quitting: 22.3   Smokeless tobacco: Never Used  Substance Use Topics   Alcohol use: Yes    Alcohol/week: 6.0 standard drinks    Types: 6 Glasses of wine per week    Comment: daily   Drug use: No     Family Hx: The patient's family history includes Anesthesia problems in her mother; Colon polyps in her mother. There is no history of Colon cancer.  ROS:   Please see the history of present illness.    No fevers chills night sweats No cough or wheezing Mild confusion Lower extremity weakness with sensory polyneuropathy Severe right convex scoliosis No chest pain Positive hypothyroidism Positive for depression Positive for OSA  All other systems reviewed and are negative.   Prior CV studies:   The following studies were reviewed today:  ------------------------------------------------------------------- ECHO 11/13/2016 Study Conclusions  - Left ventricle: The cavity size was normal. Wall thickness was   normal. Systolic function was normal. The estimated ejection   fraction was in the range of 55% to 60%. Left ventricular   diastolic function parameters were normal. - Mitral valve: There was mild regurgitation. - Left atrium: The atrium was mildly dilated. - Pulmonary arteries: PA peak pressure: 38 mm Hg (S).   Labs/Other Tests and Data Reviewed:    EKG:  An ECG dated 06/28/2018 was personally reviewed today and demonstrated: AV paced rhythm with prolonged AV conduction, ventricular rate 65, PR interval 248 ms.  No ectopy  Recent Labs: 11/09/2018: BUN 12; Creatinine, Ser 0.85; Hemoglobin 11.0; Platelets 285; Potassium 4.5; Sodium 128   Recent Lipid  Panel Lab Results  Component Value Date/Time   CHOL  12/19/2006 03:15 AM    172        ATP III CLASSIFICATION:  <200     mg/dL   Desirable  [id number]  mg/dL   Borderline High  >=102>=240  mg/dL   High   TRIG 244 (H) 06/18/7251 03:15 AM   HDL 37 (L) 12/19/2006 03:15 AM   CHOLHDL 4.6 12/19/2006 03:15 AM   LDLCALC  12/19/2006 03:15 AM    76        Total Cholesterol/HDL:CHD Risk Coronary Heart Disease Risk Table                     Men   Women  1/2 Average Risk   3.4   3.3    Wt Readings from Last 3 Encounters:  12/30/18 119 lb (54 kg)  11/09/18 125 lb (56.7 kg)  09/29/18 133 lb (60.3 kg)     Objective:    Vital Signs:  BP (!) 90/50    Pulse 68    Ht 5\' 4"  (1.626 m)    Wt 119 lb (54 kg)    BMI 20.43 kg/m    Since this was an audio visit I could not perform a physical exam  However her blood pressure was reported to be low at 90/50 and pulse regular at 68 There is no audible wheezing Respirations appeared unlabored She did have some confusion She denied chest pain or palpitations She has issues with leg weakness She has scoliosis There is no swelling in her legs according to her She has issues with her peripheral neuropathy   ASSESSMENT & PLAN:    1. PAF: She appears to be maintaining sinus rhythm on propafenone, atenolol, and Cardizem CD.  She has been AV pacing in the past and has underlying sinus node dysfunction.  She is unaware of any pulse irregularity 2. History of orthostatic hypotension.  Her blood pressure today is reportedly low at 90/50.  When she was seen by the neurologist it was 99/70.  At previous evaluation I recommended discontinuance of furosemide.  She also has been on low-dose Midodrin 2.5 mg twice a day.  In the past she had issues with recurrent A. fib and I and hesitant to significantly reduce her 120 nightly diltiazem CD but will change her atenolol from 50 mg in the morning to 25 mg twice a day and if blood pressure remains less than 100 to reduce this  to just 25 mg daily.  I have recommended slight titration of Midodrin 2.5 mg 3 times per day. 3. Obstructive sleep apnea: She is not using this and I suspect her machine has been returned due to failure to meet compliance within 90 days.  On download there is no record of recent use. 4. Pacemaker: I have recommended a one-year follow-up evaluation with Dr. to be done in August 2020. 5. Anticoagulation: No bleeding on Eliquis  COVID-19 Education: The signs and symptoms of COVID-19 were discussed with the patient and how to seek care for testing (follow up with PCP or arrange E-visit).  The importance of social distancing was discussed today.  Time:   Today, I have spent 24 minutes with the patient with telehealth technology discussing the above problems.     Medication Adjustments/Labs and Tests Ordered: Current medicines are reviewed at length with the patient today.  Concerns regarding medicines are outlined above.   Tests Ordered: No orders of the defined types were placed in this encounter.   Medication Changes: No orders of the defined types were placed in this encounter.   Follow Up: Pacemaker evaluation with Dr. September 2020 in August 2020 with office visit with me in approximately 4-6 months.  Signed, September 2020, MD  12/30/2018 2:32  PM    [facility]

## 2019-01-04 ENCOUNTER — Other Ambulatory Visit: Payer: Self-pay | Admitting: Cardiovascular Disease

## 2019-01-04 NOTE — Telephone Encounter (Signed)
Refill Request.  

## 2019-01-04 NOTE — Telephone Encounter (Signed)
63f 61.6kg Scr 0.85 (11/09/18) Lovw/kelly 12/30/18

## 2019-01-20 ENCOUNTER — Inpatient Hospital Stay (HOSPITAL_COMMUNITY)
Admission: EM | Admit: 2019-01-20 | Discharge: 2019-01-26 | DRG: 605 | Disposition: A | Discharge status: Skilled Nursing Facility | Payer: Medicare Other | Attending: Internal Medicine | Admitting: Internal Medicine

## 2019-01-20 ENCOUNTER — Emergency Department (HOSPITAL_COMMUNITY): Payer: Medicare Other

## 2019-01-20 ENCOUNTER — Other Ambulatory Visit: Payer: Self-pay

## 2019-01-20 DIAGNOSIS — M25552 Pain in left hip: Secondary | ICD-10-CM

## 2019-01-20 DIAGNOSIS — I951 Orthostatic hypotension: Secondary | ICD-10-CM | POA: Diagnosis present

## 2019-01-20 DIAGNOSIS — S7012XA Contusion of left thigh, initial encounter: Secondary | ICD-10-CM | POA: Diagnosis not present

## 2019-01-20 DIAGNOSIS — W19XXXA Unspecified fall, initial encounter: Secondary | ICD-10-CM

## 2019-01-20 DIAGNOSIS — F329 Major depressive disorder, single episode, unspecified: Secondary | ICD-10-CM | POA: Diagnosis present

## 2019-01-20 DIAGNOSIS — N390 Urinary tract infection, site not specified: Secondary | ICD-10-CM | POA: Diagnosis present

## 2019-01-20 DIAGNOSIS — Z79899 Other long term (current) drug therapy: Secondary | ICD-10-CM

## 2019-01-20 DIAGNOSIS — S01112A Laceration without foreign body of left eyelid and periocular area, initial encounter: Secondary | ICD-10-CM | POA: Diagnosis present

## 2019-01-20 DIAGNOSIS — B9620 Unspecified Escherichia coli [E. coli] as the cause of diseases classified elsewhere: Secondary | ICD-10-CM | POA: Diagnosis present

## 2019-01-20 DIAGNOSIS — R54 Age-related physical debility: Secondary | ICD-10-CM | POA: Diagnosis present

## 2019-01-20 DIAGNOSIS — E039 Hypothyroidism, unspecified: Secondary | ICD-10-CM | POA: Diagnosis present

## 2019-01-20 DIAGNOSIS — Z96652 Presence of left artificial knee joint: Secondary | ICD-10-CM | POA: Diagnosis present

## 2019-01-20 DIAGNOSIS — Z888 Allergy status to other drugs, medicaments and biological substances status: Secondary | ICD-10-CM

## 2019-01-20 DIAGNOSIS — Z7901 Long term (current) use of anticoagulants: Secondary | ICD-10-CM

## 2019-01-20 DIAGNOSIS — M069 Rheumatoid arthritis, unspecified: Secondary | ICD-10-CM | POA: Diagnosis present

## 2019-01-20 DIAGNOSIS — K219 Gastro-esophageal reflux disease without esophagitis: Secondary | ICD-10-CM | POA: Diagnosis present

## 2019-01-20 DIAGNOSIS — Y92009 Unspecified place in unspecified non-institutional (private) residence as the place of occurrence of the external cause: Secondary | ICD-10-CM

## 2019-01-20 DIAGNOSIS — Z20828 Contact with and (suspected) exposure to other viral communicable diseases: Secondary | ICD-10-CM | POA: Diagnosis present

## 2019-01-20 DIAGNOSIS — Z87891 Personal history of nicotine dependence: Secondary | ICD-10-CM

## 2019-01-20 DIAGNOSIS — Z950 Presence of cardiac pacemaker: Secondary | ICD-10-CM

## 2019-01-20 DIAGNOSIS — Z881 Allergy status to other antibiotic agents status: Secondary | ICD-10-CM

## 2019-01-20 DIAGNOSIS — I480 Paroxysmal atrial fibrillation: Secondary | ICD-10-CM | POA: Diagnosis present

## 2019-01-20 DIAGNOSIS — T148XXA Other injury of unspecified body region, initial encounter: Secondary | ICD-10-CM | POA: Diagnosis present

## 2019-01-20 DIAGNOSIS — E871 Hypo-osmolality and hyponatremia: Secondary | ICD-10-CM | POA: Diagnosis present

## 2019-01-20 DIAGNOSIS — S0181XA Laceration without foreign body of other part of head, initial encounter: Secondary | ICD-10-CM

## 2019-01-20 DIAGNOSIS — Z880 Allergy status to penicillin: Secondary | ICD-10-CM

## 2019-01-20 DIAGNOSIS — K589 Irritable bowel syndrome without diarrhea: Secondary | ICD-10-CM | POA: Diagnosis present

## 2019-01-20 DIAGNOSIS — Z886 Allergy status to analgesic agent status: Secondary | ICD-10-CM

## 2019-01-20 DIAGNOSIS — Z79890 Hormone replacement therapy (postmenopausal): Secondary | ICD-10-CM

## 2019-01-20 DIAGNOSIS — S7002XA Contusion of left hip, initial encounter: Secondary | ICD-10-CM | POA: Diagnosis present

## 2019-01-20 DIAGNOSIS — R109 Unspecified abdominal pain: Secondary | ICD-10-CM

## 2019-01-20 DIAGNOSIS — S300XXA Contusion of lower back and pelvis, initial encounter: Secondary | ICD-10-CM | POA: Diagnosis present

## 2019-01-20 DIAGNOSIS — I495 Sick sinus syndrome: Secondary | ICD-10-CM | POA: Diagnosis present

## 2019-01-20 DIAGNOSIS — D649 Anemia, unspecified: Secondary | ICD-10-CM | POA: Diagnosis present

## 2019-01-20 DIAGNOSIS — W010XXA Fall on same level from slipping, tripping and stumbling without subsequent striking against object, initial encounter: Secondary | ICD-10-CM | POA: Diagnosis present

## 2019-01-20 DIAGNOSIS — H409 Unspecified glaucoma: Secondary | ICD-10-CM | POA: Diagnosis present

## 2019-01-20 DIAGNOSIS — D62 Acute posthemorrhagic anemia: Secondary | ICD-10-CM | POA: Diagnosis present

## 2019-01-20 DIAGNOSIS — I10 Essential (primary) hypertension: Secondary | ICD-10-CM | POA: Diagnosis present

## 2019-01-20 DIAGNOSIS — R410 Disorientation, unspecified: Secondary | ICD-10-CM | POA: Diagnosis present

## 2019-01-20 LAB — URINALYSIS, ROUTINE W REFLEX MICROSCOPIC
Bilirubin Urine: NEGATIVE
Glucose, UA: NEGATIVE mg/dL
Hgb urine dipstick: NEGATIVE
Ketones, ur: NEGATIVE mg/dL
Nitrite: NEGATIVE
Protein, ur: NEGATIVE mg/dL
Specific Gravity, Urine: 1.015 (ref 1.005–1.030)
WBC, UA: >50 WBC/hpf — ABNORMAL HIGH (ref 0–5)
pH: 5 (ref 5.0–8.0)

## 2019-01-20 LAB — CBC WITH DIFFERENTIAL/PLATELET
Abs Immature Granulocytes: 0.04 10*3/uL (ref 0.00–0.07)
Basophils Absolute: 0.1 10*3/uL (ref 0.0–0.1)
Basophils Relative: 1 %
Eosinophils Absolute: 0 10*3/uL (ref 0.0–0.5)
Eosinophils Relative: 0 %
HCT: 30.6 % — ABNORMAL LOW (ref 36.0–46.0)
Hemoglobin: 10.1 g/dL — ABNORMAL LOW (ref 12.0–15.0)
Immature Granulocytes: 1 %
Lymphocytes Relative: 16 %
Lymphs Abs: 1 10*3/uL (ref 0.7–4.0)
MCH: 31.3 pg (ref 26.0–34.0)
MCHC: 33 g/dL (ref 30.0–36.0)
MCV: 94.7 fL (ref 80.0–100.0)
Monocytes Absolute: 0.7 10*3/uL (ref 0.1–1.0)
Monocytes Relative: 11 %
Neutro Abs: 4.5 10*3/uL (ref 1.7–7.7)
Neutrophils Relative %: 71 %
Platelets: 238 10*3/uL (ref 150–400)
RBC: 3.23 MIL/uL — ABNORMAL LOW (ref 3.87–5.11)
RDW: 12.7 % (ref 11.5–15.5)
WBC: 6.3 10*3/uL (ref 4.0–10.5)
nRBC: 0 % (ref 0.0–0.2)

## 2019-01-20 LAB — PROTIME-INR
INR: 1.4 — ABNORMAL HIGH (ref 0.8–1.2)
Prothrombin Time: 16.7 seconds — ABNORMAL HIGH (ref 11.4–15.2)

## 2019-01-20 LAB — BASIC METABOLIC PANEL
Anion gap: 9 (ref 5–15)
BUN: 12 mg/dL (ref 8–23)
CO2: 23 mmol/L (ref 22–32)
Calcium: 9 mg/dL (ref 8.9–10.3)
Chloride: 98 mmol/L (ref 98–111)
Creatinine, Ser: 0.85 mg/dL (ref 0.44–1.00)
GFR calc Af Amer: >60 mL/min (ref >60)
GFR calc non Af Amer: >60 mL/min (ref >60)
Glucose, Bld: 98 mg/dL (ref 70–99)
Potassium: 4.1 mmol/L (ref 3.5–5.1)
Sodium: 130 mmol/L — ABNORMAL LOW (ref 135–145)

## 2019-01-20 LAB — HEMOGLOBIN AND HEMATOCRIT, BLOOD
HCT: 29.9 % — ABNORMAL LOW (ref 36.0–46.0)
Hemoglobin: 9.7 g/dL — ABNORMAL LOW (ref 12.0–15.0)

## 2019-01-20 MED ORDER — MORPHINE SULFATE (PF) 4 MG/ML IV SOLN
4.0000 mg | Freq: Once | INTRAVENOUS | Status: AC
  Administered 2019-01-20: 4 mg via INTRAVENOUS
  Filled 2019-01-20: qty 1

## 2019-01-20 MED ORDER — ONDANSETRON HCL 4 MG/2ML IJ SOLN
4.0000 mg | Freq: Once | INTRAMUSCULAR | Status: AC
  Administered 2019-01-20: 4 mg via INTRAVENOUS
  Filled 2019-01-20: qty 2

## 2019-01-20 MED ORDER — LIDOCAINE HCL (PF) 1 % IJ SOLN
5.0000 mL | Freq: Once | INTRAMUSCULAR | Status: AC
  Administered 2019-01-20: 5 mL
  Filled 2019-01-20: qty 5

## 2019-01-20 MED ORDER — TETANUS-DIPHTH-ACELL PERTUSSIS 5-2.5-18.5 LF-MCG/0.5 IM SUSP: 0.5000 mL | Freq: Once | INTRAMUSCULAR | Status: DC

## 2019-01-20 MED ORDER — SODIUM CHLORIDE 0.9 % IV BOLUS
500.0000 mL | Freq: Once | INTRAVENOUS | Status: AC
  Administered 2019-01-20: 500 mL via INTRAVENOUS

## 2019-01-20 MED ORDER — HYDROMORPHONE HCL 1 MG/ML IJ SOLN
1.0000 mg | Freq: Once | INTRAMUSCULAR | Status: AC
  Administered 2019-01-20: 1 mg via INTRAVENOUS
  Filled 2019-01-20: qty 1

## 2019-01-20 MED ORDER — FENTANYL CITRATE (PF) 100 MCG/2ML IJ SOLN
50.0000 ug | Freq: Once | INTRAMUSCULAR | Status: AC
  Administered 2019-01-20: 50 ug via INTRAVENOUS
  Filled 2019-01-20: qty 2

## 2019-01-20 NOTE — ED Notes (Signed)
Attempted specimen blood draw; unsuccessful. Phlebotomy notified that I need a lab draw.

## 2019-01-20 NOTE — ED Provider Notes (Signed)
23:59: Assumed care of patient from Anderle PA-C at change of shift pending CT of the left hip with consult for admission.  Please see prior provider note for full H&P. Briefly patient is a 73 year old female with a history of hypertension, hypothyroidism, & paroxysmal A. fib on Eliquis who presented to the emergency department status post mechanical fall with primary complaints of left hip pain and left-sided head pain.  She sustained a head laceration which was repaired by prior provider.   Initial imaging fairly unremarkable- no significant injuries.---> Attempted ambulation with significant pain, patient unable to tolerate, became hypotensive which improved with fluids.  She had a hematoma which has been progressing in size to the left hip. Hgb/hct 10.2/36.6 initially-- mildly decreased to 9.7/29.9 w/ repeat in the ED.  Plan for CT left hip to evaluate for occult fracture, if positive will require orthopedic surgery consultation, if negative supervising physician Dr. Alvino Chapel has spoken with surgeon Dr. Redmond Pulling who is requested patient be admitted to medicine for pain control, serial hemoglobins, and his service will see her tomorrow.  CT left hip wo contrast IMPRESSION:  No acute bony abnormality. No proximal femoral fracture.    Large soft tissue hematoma overlying the proximal left femur  measuring 10 cm in greatest (craniocaudal) length.   Will consult hospitalist service for admission. Patient updated on results & plan of care, agreeable. 00:13: CONSULT: Discussed w/ Dr. Hal Hope- accepts admission.          Amaryllis Dyke, PA-C 67/67/20 [id number]    Delora Fuel, MD 96/28/36 [phone]

## 2019-01-20 NOTE — ED Notes (Signed)
Attempted to walk pt to bathroom; very unsteady gait and unable to bear weight on left leg. BP dropped when standing; Provider made aware.

## 2019-01-20 NOTE — ED Notes (Signed)
C collar removed

## 2019-01-20 NOTE — ED Notes (Signed)
Large hematoma to upper, left, lateral thigh

## 2019-01-20 NOTE — ED Provider Notes (Signed)
[facility] EMERGENCY DEPARTMENT Provider Note   CSN: [id number] Arrival date & time: 01/20/19  1636  History   Chief Complaint Chief Complaint  Patient presents with   Fall   HPI REMA LIEVANOS is a 73 y.o. female with past medical history significant for anxiety, DJD, GERD, hypertension, thyroidism, tachybradycardia with pacemaker, A. fib with RVR on anticoagulation (Eliquis) who presents for evaluation of mechanical fall.  Patient states she is a walker at baseline and tripped and fell due to a threshold between 2-4 textures.  Patient fell to her left head and left hip.  Patient sustained a laceration just superior to her left eyebrow.  Patient with thoracic back pain, left hip pain and midline cervical pain.  Took a home Norco prior to arrival.  Patient states she was unable to get herself up off the floor and was laying there for approximately 30-45 minutes until her daughter got home from work.  Rates her current pain a 5/10.  Denies radiation of pain.  History obtained from patient past medical records.  No interpreter is used.  Denies headache, blurred vision, unilateral weakness, chest pain, shortness of breath, abdominal pain, decreased range of motion, numbness or tingling to her extremities.  History obtained from patient and past medical records. No  Interpreter used.  [facility]    HPI  Past Medical History:  Diagnosis Date   Anticoagulant long-term use  WITH PRADAXA 05/27/2011   Anxiety    Cellulitis of buttock, left 12/20/2014   Complication of anesthesia    DIFFICULTY WAKING UP   Depression    Diverticulosis    DJD (degenerative joint disease)    Esophagitis 09/17/06   External hemorrhoids    GERD (gastroesophageal reflux disease)    Glaucoma    History of stress test    a. 2008 - normal nuc.   Hypertension    Hypothyroid    IBS (irritable bowel syndrome)    Paroxysmal A-fib (HCC) diagnosed  2008   a. On propafenone, Pradaxa.   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   s/p MDT PPM by Dr Rubie Maid    Patient Active Problem List   Diagnosis Date Noted   Rectal bleeding 02/01/2016   Constipation 02/01/2016   Dehydration 05/05/2015   S/P left TKA 05/01/2015   S/P knee replacement 05/01/2015   Hypotension due to drugs 02/20/2015   Cellulitis of left buttock 12/20/2014   Cellulitis 12/20/2014   AKI (acute kidney injury) (HCC) 12/20/2014   Hyponatremia 12/20/2014   Paroxysmal atrial fibrillation (HCC) 08/01/2014   Irritable bowel syndrome 06/14/2014   Fatigue 03/06/2014   HTN (hypertension) 09/28/2013   Atrial fibrillation with rapid ventricular response; paroxysmal 09/18/2013   Chest pain 10/08/2012   D-dimer, elevated, pt. on pradaxa 09/11/2011   Chest pain, negative MI, resolved once heart rate slowed, secondary to rapid a. fib 09/11/2011   Paroxysmal a-fib, recurrent 07/13/12- (on Propafenone)  05/27/2011   Sinus node dysfunction (HCC) 05/27/2011   Hypothyroid 05/27/2011   Pacemaker [employer]  (REVO) placed10/23/12 05/27/2011   Anxiety 05/27/2011   Chronic anticoagulation 05/27/2011    Past Surgical History:  Procedure Laterality Date   BREAST REDUCTION SURGERY     BUNIONECTOMY Right 07/09/2015   with rods and pins   CARDIOVERSION N/A 08/02/2014   Procedure: CARDIOVERSION;  Surgeon: Quintella Reichert, MD;  Location: [facility] ENDOSCOPY;  Service: Cardiovascular;  Laterality: N/A;  CATARACT EXTRACTION, BILATERAL Bilateral    FACIAL COSMETIC SURGERY     FOOT SURGERY Left    HAND SURGERY     multiple   LIPOSUCTION     NM MYOCAR PERF WALL MOTION  12/29/2006   No significant ischemia; EF 69%   OVARIAN CYST REMOVAL     PACEMAKER INSERTION  04/08/11   MDT Revo implanted by Dr Rubie Maid   TONSILLECTOMY     TOTAL KNEE ARTHROPLASTY Left 05/01/2015   Procedure: TOTAL  LEFT KNEE ARTHROPLASTY;  Surgeon: Durene Romans, MD;  Location: [facility] ORS;  Service: Orthopedics;  Laterality: Left;   VAGINAL HYSTERECTOMY       OB History   No obstetric history on file.      Home Medications    Prior to Admission medications   Medication Sig Start Date End Date Taking? Authorizing Provider  AMBULATORY NON FORMULARY MEDICATION Medication Name: Nitroglycerin ointment .0125% Pea size amount per rectum three times a day 10/14/16   Napoleon Form, MD  atenolol (TENORMIN) 25 MG tablet Take 1 tablet (25 mg total) by mouth 2 (two) times daily. 12/30/18   [name], MD  dicyclomine (BENTYL) 10 MG capsule TAKE ONE CAPSULE 3 TIMES DAILY BEFORE MEALS 03/02/15   Pyrtle, Carie Caddy, MD  diltiazem (CARDIZEM CD) 120 MG 24 hr capsule TAKE ONE CAPSULE BY MOUTH EVERY NIGHT AT BEDTIME 12/24/18   Croitoru, Mihai, MD  docusate sodium (COLACE) 100 MG capsule Take 1 capsule (100 mg total) by mouth 2 (two) times daily. 05/04/15   Lanney Gins, PA-C  ELIQUIS 5 MG TABS tablet TAKE 1 TABLET TWICE DAILY 01/04/19   [name], MD  escitalopram (LEXAPRO) 20 MG tablet Take 10 mg by mouth daily.    [provider]  estradiol (ESTRACE) 0.5 MG tablet Take 0.5 mg by mouth daily.    [provider]  HYDROcodone-acetaminophen (NORCO) 7.5-325 MG tablet Take 1 tablet by mouth every 6 (six) hours as needed for moderate pain.    [provider]  hydroxychloroquine (PLAQUENIL) 200 MG tablet Take 200 mg by mouth daily.    [provider]  levothyroxine (SYNTHROID, LEVOTHROID) 112 MCG tablet Take 112 mcg by mouth daily before breakfast.     [provider]  linaclotide (LINZESS) 72 MCG capsule Take 1 capsule (72 mcg total) by mouth daily before breakfast. 03/11/16   Nandigam, Eleonore Chiquito, MD  losartan (COZAAR) 25 MG tablet Take 25 mg by mouth 2 (two) times a day.    [provider]  metroNIDAZOLE (METROGEL) 1 % gel Apply topically. 10/08/16   [provider]  midodrine (PROAMATINE) 2.5 MG tablet Take 1 tablet (2.5 mg total) by mouth 3 (three) times daily with meals. 12/30/18   [name], MD  propafenone (RYTHMOL) 150 MG tablet TAKE ONE AND ON-HALF TABLETS BY MOUTH EVERY MORNING, ONE TABLET BY MOUTH MID-DAY, AND ONE AND ONE-HALF TABLETS AT BEDTIME 06/07/18   Azalee Course, PA  traZODone (DESYREL) 150 MG tablet Cut the half a tablet in half to make (37.5 mg) 06/28/18   [name], MD  triamcinolone cream (KENALOG) 0.1 % Apply to affected areas on face twice daily 10/01/16   [provider]    Family History Family History  Problem Relation Age of Onset   Anesthesia problems Mother    Colon polyps Mother        beign   Colon cancer Neg Hx     Social History Social History  Tobacco Use   Smoking status: Former Smoker    Quit date: 08/30/1996    Years since quitting: 22.4   Smokeless tobacco: Never Used  Substance Use Topics   Alcohol use: Yes    Alcohol/week: 6.0 standard drinks    Types: 6 Glasses of wine per week    Comment: daily   Drug use: No     Allergies   Azithromycin, Epinephrine, Penicillins, Lidocaine, and Metoprolol   Review of Systems Review of Systems  Constitutional: Negative.   HENT: Negative.   Respiratory: Negative.   Cardiovascular: Negative.   Gastrointestinal: Negative.   Genitourinary: Negative.   Musculoskeletal: Positive for back pain and neck pain.  Skin: Positive for wound.  Neurological: Negative.   All other systems reviewed and are negative.  Physical Exam Updated Vital Signs BP 105/64    Pulse 61    Temp 98.4 F (36.9 C) (Oral)    Resp 20    SpO2 99%   Physical Exam Vitals signs and nursing note reviewed.  Constitutional:      General: She is not in acute distress.    Appearance: She is well-developed. She is not ill-appearing, toxic-appearing or diaphoretic.  HENT:     Head: Normocephalic.     Jaw: There is normal jaw occlusion.      Comments: 3  cm laceration superior to left eyebrow.  EOMs intact.  Laceration non-gaping.  Mild oozing.    Nose: Nose normal. No nasal deformity, septal deviation, signs of injury, laceration or nasal tenderness.     Right Nostril: No epistaxis or septal hematoma.     Left Nostril: No epistaxis or septal hematoma.     Mouth/Throat:     Comments: Posterior oropharynx clear.  Mucous membranes moist. Eyes:     General: Lids are normal.     Extraocular Movements: Extraocular movements intact.     Conjunctiva/sclera: Conjunctivae normal.     Pupils: Pupils are equal, round, and reactive to light.     Comments: Pupils equal reactive to light.  EOMs intact without difficulty or pain.  Neck:     Musculoskeletal: Normal range of motion.     Trachea: Phonation normal.     Comments: C-collar in place Cardiovascular:     Rate and Rhythm: Normal rate.     Pulses: Normal pulses.          Dorsalis pedis pulses are 2+ on the right side and 2+ on the left side.       Posterior tibial pulses are 2+ on the right side and 2+ on the left side.     Heart sounds: Normal heart sounds.     Comments: No murmurs, rubs or gallops Pulmonary:     Effort: No respiratory distress.     Comments: Clear to auscultation without wheeze, rhonchi or rales.  Speaks in full sentences with difficulty.  No accessory muscle usage. Abdominal:     General: There is no distension.     Comments: Soft, nontender without rebound or guarding.  No overlying skin changes to abdominal wall.  Musculoskeletal: Normal range of motion.     Right shoulder: Normal.     Left shoulder: Normal.     Right hip: Normal.     Left hip: She exhibits tenderness, swelling and deformity. She exhibits normal range of motion, normal strength, no crepitus and no laceration.     Right knee: Normal.     Left knee: Normal.     Lumbar back: Normal.  Back:       Legs:     Comments: Ecchymosis to bilateral ankles however has full plantarflexion and dorsiflexion.   Patient with large hematoma to left hip.  No malrotation of legs.  Pelvis stable to palpation and nontender.  Moves all 4 extremities without difficulty. Tenderness palpation to thoracic back.  Feet:     Right foot:     Skin integrity: Skin integrity normal.     Left foot:     Skin integrity: Skin integrity normal.  Skin:    General: Skin is warm and dry.     Comments: Multiple areas of ecchymosis to lower extremities.  Patient with large hematoma to left hip.  2.5-3 cm non-gaping laceration to left eyebrow.  Mild oozing.  Neurological:     General: No focal deficit present.     Mental Status: She is alert.     Cranial Nerves: Cranial nerves are intact.     Sensory: Sensation is intact.     Motor: Motor function is intact.     Coordination: Coordination is intact.     Comments: Cranial 2 through 12 grossly intact.  No facial droop.    ED Treatments / Results  Labs (all labs ordered are listed, but only abnormal results are displayed) Labs Reviewed  CBC WITH DIFFERENTIAL/PLATELET - Abnormal; Notable for the following components:      Result Value   RBC 3.23 (*)    Hemoglobin 10.1 (*)    HCT 30.6 (*)    All other components within normal limits  BASIC METABOLIC PANEL - Abnormal; Notable for the following components:   Sodium 130 (*)    All other components within normal limits  PROTIME-INR - Abnormal; Notable for the following components:   Prothrombin Time 16.7 (*)    INR 1.4 (*)    All other components within normal limits  URINALYSIS, ROUTINE W REFLEX MICROSCOPIC - Abnormal; Notable for the following components:   APPearance CLOUDY (*)    Leukocytes,Ua SMALL (*)    WBC, UA >50 (*)    Bacteria, UA MANY (*)    All other components within normal limits  HEMOGLOBIN AND HEMATOCRIT, BLOOD - Abnormal; Notable for the following components:   Hemoglobin 9.7 (*)    HCT 29.9 (*)    All other components within normal limits  URINE CULTURE  SARS CORONAVIRUS 2 (HOSPITAL ORDER,  PERFORMED IN [facility] LAB)    EKG EKG Interpretation  Date/Time:  Thursday January 20 2019 17:58:54 EDT Ventricular Rate:  61 PR Interval:    QRS Duration: 109 QT Interval:  456 QTC Calculation: 460 R Axis:   20 Text Interpretation:  Sinus or ectopic atrial rhythm Prolonged PR interval Borderline low voltage, extremity leads Minimal ST elevation, inferior leads Confirmed by Benjiman Core [phone]) on 01/20/2019 11:26:16 PM   Radiology Dg Thoracic Spine 2 View  Result Date: 01/20/2019 CLINICAL DATA:  Fall EXAM: THORACIC SPINE 2 VIEWS COMPARISON:  08/01/2014 chest x-ray FINDINGS: Minimal scoliosis. Left-sided cardiac pacing leads. Vertebral body heights are maintained. Degenerative changes of the mid to lower thoracic spine with narrowing and osteophyte IMPRESSION: No acute osseous abnormality Electronically Signed   By: Jasmine Pang M.D.   On: 01/20/2019 19:19   Dg Pelvis 1-2 Views  Result Date: 01/20/2019 CLINICAL DATA:  Left hip pain.  Fall. EXAM: PELVIS - 1-2 VIEW COMPARISON:  None. FINDINGS: There is no evidence of pelvic fracture or diastasis. No pelvic bone lesions are seen. IMPRESSION: Negative. Electronically  Signed   By: Rolm Baptise M.D.   On: 01/20/2019 19:18   Dg Tibia/fibula Left  Result Date: 01/20/2019 CLINICAL DATA:  Fall, left leg pain EXAM: LEFT TIBIA AND FIBULA - 2 VIEW COMPARISON:  None. FINDINGS: Prior left knee replacement. Small bone fragments are noted off the superior pole of the left patella concerning for avulsed fragments. No tibia or fibular abnormality. IMPRESSION: Small avulsed fragments off the superior pole of the patella. Prior left knee replacement. Electronically Signed   By: Rolm Baptise M.D.   On: 01/20/2019 19:21   Dg Tibia/fibula Right  Result Date: 01/20/2019 CLINICAL DATA:  Fall EXAM: RIGHT TIBIA AND FIBULA - 2 VIEW COMPARISON:  None. FINDINGS: There is no evidence of fracture or other focal bone lesions. Soft tissues are unremarkable.  IMPRESSION: Negative. Electronically Signed   By: Donavan Foil M.D.   On: 01/20/2019 19:18   Dg Ankle Complete Left  Result Date: 01/20/2019 CLINICAL DATA:  Fall EXAM: LEFT ANKLE COMPLETE - 3+ VIEW COMPARISON:  None. FINDINGS: There is no evidence of fracture, dislocation, or joint effusion. There is no evidence of arthropathy or other focal bone abnormality. Soft tissues are unremarkable. IMPRESSION: Negative. Electronically Signed   By: Rolm Baptise M.D.   On: 01/20/2019 19:21   Ct Head Wo Contrast  Result Date: 01/20/2019 CLINICAL DATA:  Fall.  Head trauma, headache EXAM: CT HEAD WITHOUT CONTRAST CT MAXILLOFACIAL WITHOUT CONTRAST CT CERVICAL SPINE WITHOUT CONTRAST TECHNIQUE: Multidetector CT imaging of the head, cervical spine, and maxillofacial structures were performed using the standard protocol without intravenous contrast. Multiplanar CT image reconstructions of the cervical spine and maxillofacial structures were also generated. COMPARISON:  05/15/2018 FINDINGS: CT HEAD FINDINGS Brain: See There is atrophy and chronic small vessel disease changes. No acute intracranial abnormality. Specifically, no hemorrhage, hydrocephalus, mass lesion, acute infarction, or significant intracranial injury. Vascular: No hyperdense vessel or unexpected calcification. Skull: No acute calvarial abnormality. Other: None CT MAXILLOFACIAL FINDINGS Osseous: No fracture or mandibular dislocation. No destructive process. Orbits: Negative. No traumatic or inflammatory finding. Sinuses: Kozel thickening in the maxillary sinuses bilaterally. No air-fluid levels. Soft tissues: Soft tissue swelling/laceration in the left face/orbital region. CT CERVICAL SPINE FINDINGS Alignment: 3 mm of anterolisthesis of C4 on C5 related to facet disease. Skull base and vertebrae: No acute fracture. No primary bone lesion or focal pathologic process. Soft tissues and spinal canal: No prevertebral fluid or swelling. No visible canal hematoma. Disc  levels: Diffuse degenerative facet disease. Degenerative disc disease at C5-6 and C6-7. Is moderate disc herniation noted at C4-5. Upper chest: Biapical scarring.  No acute findings. Other: None IMPRESSION: Atrophy, chronic microvascular disease. No acute intracranial abnormality. Cervical spondylosis. Grade 1 anterolisthesis along with disc herniation noted at C4-5. No fracture in the cervical spine or face. Electronically Signed   By: Rolm Baptise M.D.   On: 01/20/2019 19:06   Ct Cervical Spine Wo Contrast  Result Date: 01/20/2019 CLINICAL DATA:  Fall.  Head trauma, headache EXAM: CT HEAD WITHOUT CONTRAST CT MAXILLOFACIAL WITHOUT CONTRAST CT CERVICAL SPINE WITHOUT CONTRAST TECHNIQUE: Multidetector CT imaging of the head, cervical spine, and maxillofacial structures were performed using the standard protocol without intravenous contrast. Multiplanar CT image reconstructions of the cervical spine and maxillofacial structures were also generated. COMPARISON:  05/15/2018 FINDINGS: CT HEAD FINDINGS Brain: See There is atrophy and chronic small vessel disease changes. No acute intracranial abnormality. Specifically, no hemorrhage, hydrocephalus, mass lesion, acute infarction, or significant intracranial injury. Vascular: No hyperdense vessel  or unexpected calcification. Skull: No acute calvarial abnormality. Other: None CT MAXILLOFACIAL FINDINGS Osseous: No fracture or mandibular dislocation. No destructive process. Orbits: Negative. No traumatic or inflammatory finding. Sinuses: Kozel thickening in the maxillary sinuses bilaterally. No air-fluid levels. Soft tissues: Soft tissue swelling/laceration in the left face/orbital region. CT CERVICAL SPINE FINDINGS Alignment: 3 mm of anterolisthesis of C4 on C5 related to facet disease. Skull base and vertebrae: No acute fracture. No primary bone lesion or focal pathologic process. Soft tissues and spinal canal: No prevertebral fluid or swelling. No visible canal hematoma.  Disc levels: Diffuse degenerative facet disease. Degenerative disc disease at C5-6 and C6-7. Is moderate disc herniation noted at C4-5. Upper chest: Biapical scarring.  No acute findings. Other: None IMPRESSION: Atrophy, chronic microvascular disease. No acute intracranial abnormality. Cervical spondylosis. Grade 1 anterolisthesis along with disc herniation noted at C4-5. No fracture in the cervical spine or face. Electronically Signed   By: Charlett NoseKevin  Dover M.D.   On: 01/20/2019 19:06   Ct Hip Left Wo Contrast  Result Date: 01/20/2019 CLINICAL DATA:  Fall, left hip pain EXAM: CT OF THE LEFT HIP WITHOUT CONTRAST TECHNIQUE: Multidetector CT imaging of the left hip was performed according to the standard protocol. Multiplanar CT image reconstructions were also generated. COMPARISON:  Plain films earlier today FINDINGS: No acute bony abnormality. No fracture. No subluxation or dislocation. Early joint space narrowing and spurring within the left hip joint. There is a large subcutaneous soft tissue hematoma overlying the upper lateral proximal femur measuring up to 7 cm in transverse diameter and approximately 10 cm in craniocaudal length. IMPRESSION: No acute bony abnormality. No proximal femoral fracture. Large soft tissue hematoma overlying the proximal left femur measuring 10 cm in greatest (craniocaudal) length. Electronically Signed   By: Charlett NoseKevin  Dover M.D.   On: 01/20/2019 23:56   Dg Femur Min 2 Views Left  Result Date: 01/20/2019 CLINICAL DATA:  Fall, left leg pain EXAM: LEFT FEMUR 2 VIEWS COMPARISON:  06/28/2014 FINDINGS: Prior left knee replacement. Small bone fragments are noted off the superior pole of the left patella on the lateral view, concerning for avulsed fracture fragments. No additional bony abnormality. IMPRESSION: Small bone fragments off the superior pole of the patella concerning for avulsed fracture fragments. Prior left knee replacement. Electronically Signed   By: Charlett NoseKevin  Dover M.D.   On:  01/20/2019 19:20   Ct Maxillofacial Wo Contrast  Result Date: 01/20/2019 CLINICAL DATA:  Fall.  Head trauma, headache EXAM: CT HEAD WITHOUT CONTRAST CT MAXILLOFACIAL WITHOUT CONTRAST CT CERVICAL SPINE WITHOUT CONTRAST TECHNIQUE: Multidetector CT imaging of the head, cervical spine, and maxillofacial structures were performed using the standard protocol without intravenous contrast. Multiplanar CT image reconstructions of the cervical spine and maxillofacial structures were also generated. COMPARISON:  05/15/2018 FINDINGS: CT HEAD FINDINGS Brain: See There is atrophy and chronic small vessel disease changes. No acute intracranial abnormality. Specifically, no hemorrhage, hydrocephalus, mass lesion, acute infarction, or significant intracranial injury. Vascular: No hyperdense vessel or unexpected calcification. Skull: No acute calvarial abnormality. Other: None CT MAXILLOFACIAL FINDINGS Osseous: No fracture or mandibular dislocation. No destructive process. Orbits: Negative. No traumatic or inflammatory finding. Sinuses: Kozel thickening in the maxillary sinuses bilaterally. No air-fluid levels. Soft tissues: Soft tissue swelling/laceration in the left face/orbital region. CT CERVICAL SPINE FINDINGS Alignment: 3 mm of anterolisthesis of C4 on C5 related to facet disease. Skull base and vertebrae: No acute fracture. No primary bone lesion or focal pathologic process. Soft tissues and spinal canal: No  prevertebral fluid or swelling. No visible canal hematoma. Disc levels: Diffuse degenerative facet disease. Degenerative disc disease at C5-6 and C6-7. Is moderate disc herniation noted at C4-5. Upper chest: Biapical scarring.  No acute findings. Other: None IMPRESSION: Atrophy, chronic microvascular disease. No acute intracranial abnormality. Cervical spondylosis. Grade 1 anterolisthesis along with disc herniation noted at C4-5. No fracture in the cervical spine or face. Electronically Signed   By: Charlett Nose M.D.    On: 01/20/2019 19:06    Procedures .Marland KitchenLaceration Repair  Date/Time: 01/20/2019 9:21 PM Performed by: Linwood Dibbles, PA-C Authorized by: Linwood Dibbles, PA-C   Consent:    Consent obtained:  Verbal   Consent given by:  Patient   Risks discussed:  Infection, need for additional repair, pain, poor cosmetic result and poor wound healing   Alternatives discussed:  No treatment and delayed treatment Universal protocol:    Procedure explained and questions answered to patient or proxy's satisfaction: yes     Relevant documents present and verified: yes     Test results available and properly labeled: yes     Imaging studies available: yes     Required blood products, implants, devices, and special equipment available: yes     Site/side marked: yes     Immediately prior to procedure, a time out was called: yes     Patient identity confirmed:  Verbally with patient Anesthesia (see MAR for exact dosages):    Anesthesia method:  Local infiltration   Local anesthetic:  Lidocaine 1% w/o epi Laceration details:    Location:  Face   Face location:  L eyebrow   Length (cm):  2.5   Depth (mm):  2 Repair type:    Repair type:  Intermediate Pre-procedure details:    Preparation:  Patient was prepped and draped in usual sterile fashion Exploration:    Hemostasis achieved with:  Direct pressure   Wound exploration: wound explored through full range of motion and entire depth of wound probed and visualized     Contaminated: no   Treatment:    Area cleansed with:  Betadine   Amount of cleaning:  Extensive   Irrigation solution:  Sterile saline   Irrigation volume:  500 Skin repair:    Repair method:  Sutures   Suture size:  5-0   Suture material:  Prolene   Suture technique:  Simple interrupted   Number of sutures:  5 Approximation:    Approximation:  Close Post-procedure details:    Dressing:  Bulky dressing   Patient tolerance of procedure:  Tolerated well, no immediate  complications   (including critical care time)  Medications Ordered in ED Medications  Tdap (BOOSTRIX) injection 0.5 mL (0.5 mLs Intramuscular Not Given 01/20/19 1812)  sodium chloride 0.9 % bolus 500 mL (0 mLs Intravenous Stopped 01/20/19 1953)  ondansetron (ZOFRAN) injection 4 mg (4 mg Intravenous Given 01/20/19 1816)  morphine 4 MG/ML injection 4 mg (4 mg Intravenous Given 01/20/19 1816)  lidocaine (PF) (XYLOCAINE) 1 % injection 5 mL (5 mLs Infiltration Given 01/20/19 1816)  HYDROmorphone (DILAUDID) injection 1 mg (1 mg Intravenous Given 01/20/19 1956)  sodium chloride 0.9 % bolus 500 mL (0 mLs Intravenous Stopped 01/20/19 2235)  fentaNYL (SUBLIMAZE) injection 50 mcg (50 mcg Intravenous Given 01/20/19 2316)   Initial Impression / Assessment and Plan / ED Course  I have reviewed the triage vital signs and the nursing notes.  Pertinent labs & imaging results that were available during my care of the  patient were reviewed by me and considered in my medical decision making (see chart for details).  73 year old female appears otherwise well presents for evaluation after mechanical fall.  Eliquis for Afib.  2.5-3 cm laceration to left eyebrow.  EOMs intact.  No evidence of orbital entrapment.  Cranial nerves II through XII grossly intact.  No unilateral weakness.  She does have a large hematoma to her left hip and multiple areas of ecchymosis.  C-collar in place and C-spine.  No tenderness to chest, abdomen soft, nontender without rebound or guarding.  Moves all 4 extremities without difficulty.  Mild tenderness to her thoracic spine without obvious step-offs.  Denies preceding aura, chest pain, sudden onset thunderclap headache or shortness of breath.  Has chronic dizziness however not worse causing the fall or after fall.  Will obtain imaging, baseline labs, EKG and reevaluate.  Tetanus not up-to-date, will update.  CT head and cervical spine negative, plain film bilateral ankles, left femur, left pelvis  negative for acute fracture dislocation.  Possible bone fragments to left patellar pole however patient without any tenderness with full range of motion.  She is able to straight leg raise bilateral without difficulty. Low suspicion for acute fracture.  Laceration to head sutured with #5 Prolene sutures.  Patient's hematoma to left hip has doubled in size since initial evaluation and now currently about half the size of a basketball.  Patient unable to bear weight secondary to left hip pain.  Will obtain CT scan to assess for occult fracture.  Upon returning after trial ambulation patient with pallor and hypotensive to systolic blood pressure of 80/50.  Given large hematoma concern for active bleeding into her left hip.  Will recheck her hemoglobin as well as order additional fluids.  2245: Consulted with Dr. Andrey CampanileWilson, General surgery/Trauma service who will consult tomorrow if negative fracture, recommends serial Hemoglobins and medicine to admit.  2345: CT hip pending.  COVID test pending.  Patient with improvement of vital signs to 105 systolic with fluids.  Repeat Hemoglobin 9.7. Still unable to bear weight.  Patient will need medicine admission for her serial hemoglobins.  If hip fracture on CT will need orthopedic consult.  Care transferred to Petrucelli-PA who will follow up on CT hip and admission to medicine service.   Patient has been seen evaluated by attending physician, Dr. Rubin PayorPickering who agrees with the treatment, plan and disposition.   Final Clinical Impressions(s) / ED Diagnoses   Final diagnoses:  Fall, initial encounter  Hematoma  Facial laceration, initial encounter  Pain of left hip joint    ED Discharge Orders    None       Saahil Herbster A, PA-C 01/21/19 0000    Benjiman CorePickering, Nathan, MD 01/22/19 [phone]

## 2019-01-20 NOTE — ED Triage Notes (Signed)
Pt states that she fell after her walker "went wonky"; states that she thinks that there is something wrong with her walker's hand break.

## 2019-01-20 NOTE — ED Notes (Signed)
Daughter is on her way to be with the pt.

## 2019-01-21 ENCOUNTER — Encounter (HOSPITAL_COMMUNITY): Payer: Self-pay | Admitting: Student

## 2019-01-21 DIAGNOSIS — E039 Hypothyroidism, unspecified: Secondary | ICD-10-CM | POA: Diagnosis present

## 2019-01-21 DIAGNOSIS — I480 Paroxysmal atrial fibrillation: Secondary | ICD-10-CM | POA: Diagnosis present

## 2019-01-21 DIAGNOSIS — D649 Anemia, unspecified: Secondary | ICD-10-CM | POA: Diagnosis not present

## 2019-01-21 DIAGNOSIS — Z7901 Long term (current) use of anticoagulants: Secondary | ICD-10-CM | POA: Diagnosis not present

## 2019-01-21 DIAGNOSIS — H409 Unspecified glaucoma: Secondary | ICD-10-CM | POA: Diagnosis present

## 2019-01-21 DIAGNOSIS — T148XXA Other injury of unspecified body region, initial encounter: Secondary | ICD-10-CM | POA: Diagnosis not present

## 2019-01-21 DIAGNOSIS — R54 Age-related physical debility: Secondary | ICD-10-CM | POA: Diagnosis present

## 2019-01-21 DIAGNOSIS — S7012XA Contusion of left thigh, initial encounter: Secondary | ICD-10-CM | POA: Diagnosis present

## 2019-01-21 DIAGNOSIS — S7012XD Contusion of left thigh, subsequent encounter: Secondary | ICD-10-CM | POA: Diagnosis not present

## 2019-01-21 DIAGNOSIS — K589 Irritable bowel syndrome without diarrhea: Secondary | ICD-10-CM | POA: Diagnosis present

## 2019-01-21 DIAGNOSIS — Z20828 Contact with and (suspected) exposure to other viral communicable diseases: Secondary | ICD-10-CM | POA: Diagnosis present

## 2019-01-21 DIAGNOSIS — E871 Hypo-osmolality and hyponatremia: Secondary | ICD-10-CM | POA: Diagnosis present

## 2019-01-21 DIAGNOSIS — Z950 Presence of cardiac pacemaker: Secondary | ICD-10-CM | POA: Diagnosis not present

## 2019-01-21 DIAGNOSIS — S0181XA Laceration without foreign body of other part of head, initial encounter: Secondary | ICD-10-CM

## 2019-01-21 DIAGNOSIS — S300XXA Contusion of lower back and pelvis, initial encounter: Secondary | ICD-10-CM | POA: Diagnosis present

## 2019-01-21 DIAGNOSIS — I495 Sick sinus syndrome: Secondary | ICD-10-CM | POA: Diagnosis present

## 2019-01-21 DIAGNOSIS — S7002XA Contusion of left hip, initial encounter: Secondary | ICD-10-CM

## 2019-01-21 DIAGNOSIS — I951 Orthostatic hypotension: Secondary | ICD-10-CM | POA: Diagnosis present

## 2019-01-21 DIAGNOSIS — N390 Urinary tract infection, site not specified: Secondary | ICD-10-CM | POA: Diagnosis present

## 2019-01-21 DIAGNOSIS — W010XXA Fall on same level from slipping, tripping and stumbling without subsequent striking against object, initial encounter: Secondary | ICD-10-CM | POA: Diagnosis present

## 2019-01-21 DIAGNOSIS — I10 Essential (primary) hypertension: Secondary | ICD-10-CM | POA: Diagnosis present

## 2019-01-21 DIAGNOSIS — Y92009 Unspecified place in unspecified non-institutional (private) residence as the place of occurrence of the external cause: Secondary | ICD-10-CM | POA: Diagnosis not present

## 2019-01-21 DIAGNOSIS — M069 Rheumatoid arthritis, unspecified: Secondary | ICD-10-CM | POA: Diagnosis present

## 2019-01-21 DIAGNOSIS — D62 Acute posthemorrhagic anemia: Secondary | ICD-10-CM | POA: Diagnosis present

## 2019-01-21 DIAGNOSIS — K219 Gastro-esophageal reflux disease without esophagitis: Secondary | ICD-10-CM | POA: Diagnosis present

## 2019-01-21 DIAGNOSIS — S01112A Laceration without foreign body of left eyelid and periocular area, initial encounter: Secondary | ICD-10-CM | POA: Diagnosis present

## 2019-01-21 DIAGNOSIS — B9620 Unspecified Escherichia coli [E. coli] as the cause of diseases classified elsewhere: Secondary | ICD-10-CM | POA: Diagnosis present

## 2019-01-21 DIAGNOSIS — R410 Disorientation, unspecified: Secondary | ICD-10-CM | POA: Diagnosis present

## 2019-01-21 LAB — BASIC METABOLIC PANEL
Anion gap: 7 (ref 5–15)
BUN: 11 mg/dL (ref 8–23)
CO2: 25 mmol/L (ref 22–32)
Calcium: 8.9 mg/dL (ref 8.9–10.3)
Chloride: 97 mmol/L — ABNORMAL LOW (ref 98–111)
Creatinine, Ser: 0.87 mg/dL (ref 0.44–1.00)
GFR calc Af Amer: >60 mL/min (ref >60)
GFR calc non Af Amer: >60 mL/min (ref >60)
Glucose, Bld: 99 mg/dL (ref 70–99)
Potassium: 4.2 mmol/L (ref 3.5–5.1)
Sodium: 129 mmol/L — ABNORMAL LOW (ref 135–145)

## 2019-01-21 LAB — CBC
HCT: 27.2 % — ABNORMAL LOW (ref 36.0–46.0)
HCT: 24.1 % — ABNORMAL LOW (ref 36.0–46.0)
HCT: 21 % — ABNORMAL LOW (ref 36.0–46.0)
Hemoglobin: 9 g/dL — ABNORMAL LOW (ref 12.0–15.0)
Hemoglobin: 8.1 g/dL — ABNORMAL LOW (ref 12.0–15.0)
Hemoglobin: 7 g/dL — ABNORMAL LOW (ref 12.0–15.0)
MCH: 31.1 pg (ref 26.0–34.0)
MCH: 31.2 pg (ref 26.0–34.0)
MCH: 31 pg (ref 26.0–34.0)
MCHC: 33.1 g/dL (ref 30.0–36.0)
MCHC: 33.6 g/dL (ref 30.0–36.0)
MCHC: 33.3 g/dL (ref 30.0–36.0)
MCV: 94.1 fL (ref 80.0–100.0)
MCV: 92.7 fL (ref 80.0–100.0)
MCV: 92.9 fL (ref 80.0–100.0)
Platelets: 225 10*3/uL (ref 150–400)
Platelets: 193 10*3/uL (ref 150–400)
Platelets: 190 10*3/uL (ref 150–400)
RBC: 2.89 MIL/uL — ABNORMAL LOW (ref 3.87–5.11)
RBC: 2.6 MIL/uL — ABNORMAL LOW (ref 3.87–5.11)
RBC: 2.26 MIL/uL — ABNORMAL LOW (ref 3.87–5.11)
RDW: 12.8 % (ref 11.5–15.5)
RDW: 12.6 % (ref 11.5–15.5)
RDW: 12.7 % (ref 11.5–15.5)
WBC: 4.8 10*3/uL (ref 4.0–10.5)
WBC: 4.9 10*3/uL (ref 4.0–10.5)
WBC: 4.6 10*3/uL (ref 4.0–10.5)
nRBC: 0 % (ref 0.0–0.2)
nRBC: 0 % (ref 0.0–0.2)
nRBC: 0 % (ref 0.0–0.2)

## 2019-01-21 LAB — SARS CORONAVIRUS 2 BY RT PCR (HOSPITAL ORDER, PERFORMED IN ~~LOC~~ HOSPITAL LAB): SARS Coronavirus 2: NEGATIVE

## 2019-01-21 LAB — PREPARE RBC (CROSSMATCH)

## 2019-01-21 MED ORDER — HYDROXYCHLOROQUINE SULFATE 200 MG PO TABS
200.0000 mg | ORAL_TABLET | Freq: Every day | ORAL | Status: DC
  Administered 2019-01-21 – 2019-01-26 (×6): 200 mg via ORAL
  Filled 2019-01-21 (×6): qty 1

## 2019-01-21 MED ORDER — ONDANSETRON HCL 4 MG PO TABS: 4.0000 mg | ORAL_TABLET | Freq: Four times a day (QID) | ORAL | Status: DC | PRN

## 2019-01-21 MED ORDER — SODIUM CHLORIDE 0.9% FLUSH: 10.0000 mL | INTRAVENOUS | Status: DC | PRN

## 2019-01-21 MED ORDER — ESCITALOPRAM OXALATE 10 MG PO TABS
10.0000 mg | ORAL_TABLET | Freq: Every day | ORAL | Status: DC
  Administered 2019-01-21 – 2019-01-26 (×6): 10 mg via ORAL
  Filled 2019-01-21 (×6): qty 1

## 2019-01-21 MED ORDER — ACETAMINOPHEN 325 MG PO TABS
650.0000 mg | ORAL_TABLET | Freq: Four times a day (QID) | ORAL | Status: DC | PRN
  Administered 2019-01-24: 650 mg via ORAL
  Filled 2019-01-21: qty 2

## 2019-01-21 MED ORDER — ONDANSETRON HCL 4 MG/2ML IJ SOLN
4.0000 mg | Freq: Four times a day (QID) | INTRAMUSCULAR | Status: DC | PRN
  Administered 2019-01-24 (×2): 4 mg via INTRAVENOUS
  Filled 2019-01-21 (×2): qty 2

## 2019-01-21 MED ORDER — PROPAFENONE HCL 150 MG PO TABS
225.0000 mg | ORAL_TABLET | Freq: Two times a day (BID) | ORAL | Status: DC
  Administered 2019-01-21 – 2019-01-26 (×10): 225 mg via ORAL
  Filled 2019-01-21 (×14): qty 2

## 2019-01-21 MED ORDER — CIPROFLOXACIN IN D5W 400 MG/200ML IV SOLN
400.0000 mg | Freq: Two times a day (BID) | INTRAVENOUS | Status: DC
  Administered 2019-01-21 – 2019-01-23 (×5): 400 mg via INTRAVENOUS
  Filled 2019-01-21 (×5): qty 200

## 2019-01-21 MED ORDER — LEVOTHYROXINE SODIUM 112 MCG PO TABS
112.0000 ug | ORAL_TABLET | Freq: Every day | ORAL | Status: DC
  Administered 2019-01-21 – 2019-01-26 (×6): 112 ug via ORAL
  Filled 2019-01-21 (×6): qty 1

## 2019-01-21 MED ORDER — MIDODRINE HCL 2.5 MG PO TABS
2.5000 mg | ORAL_TABLET | Freq: Three times a day (TID) | ORAL | Status: DC
  Administered 2019-01-21 – 2019-01-26 (×16): 2.5 mg via ORAL
  Filled 2019-01-21 (×19): qty 1

## 2019-01-21 MED ORDER — SODIUM CHLORIDE 0.9 % IV SOLN
INTRAVENOUS | Status: DC
  Administered 2019-01-21 – 2019-01-23 (×3): via INTRAVENOUS

## 2019-01-21 MED ORDER — ATENOLOL 50 MG PO TABS
25.0000 mg | ORAL_TABLET | Freq: Two times a day (BID) | ORAL | Status: DC
  Administered 2019-01-21 – 2019-01-26 (×10): 25 mg via ORAL
  Filled 2019-01-21 (×11): qty 1

## 2019-01-21 MED ORDER — DOCUSATE SODIUM 100 MG PO CAPS
100.0000 mg | ORAL_CAPSULE | Freq: Two times a day (BID) | ORAL | Status: DC
  Administered 2019-01-21 – 2019-01-26 (×11): 100 mg via ORAL
  Filled 2019-01-21 (×12): qty 1

## 2019-01-21 MED ORDER — METRONIDAZOLE 0.75 % EX GEL
1.0000 "application " | Freq: Every day | CUTANEOUS | Status: DC
  Administered 2019-01-21 – 2019-01-26 (×6): 1 via TOPICAL
  Filled 2019-01-21: qty 45

## 2019-01-21 MED ORDER — HYDROMORPHONE HCL 1 MG/ML IJ SOLN
0.5000 mg | INTRAMUSCULAR | Status: DC | PRN
  Administered 2019-01-21 – 2019-01-23 (×9): 0.5 mg via INTRAVENOUS
  Filled 2019-01-21 (×9): qty 0.5

## 2019-01-21 MED ORDER — SODIUM CHLORIDE 0.9% IV SOLUTION
Freq: Once | INTRAVENOUS | Status: AC
  Administered 2019-01-22: 03:00:00 via INTRAVENOUS

## 2019-01-21 MED ORDER — ACETAMINOPHEN 650 MG RE SUPP: 650.0000 mg | Freq: Four times a day (QID) | RECTAL | Status: DC | PRN

## 2019-01-21 MED ORDER — PROPAFENONE HCL 150 MG PO TABS
150.0000 mg | ORAL_TABLET | ORAL | Status: DC
  Administered 2019-01-21 – 2019-01-26 (×6): 150 mg via ORAL
  Filled 2019-01-21 (×6): qty 1

## 2019-01-21 MED ORDER — DILTIAZEM HCL ER COATED BEADS 120 MG PO CP24
120.0000 mg | ORAL_CAPSULE | Freq: Every day | ORAL | Status: DC
  Administered 2019-01-22 – 2019-01-25 (×4): 120 mg via ORAL
  Filled 2019-01-21 (×4): qty 1

## 2019-01-21 NOTE — Consult Note (Signed)
CARDIOLOGY CONSULT NOTE       Patient ID: Jacqueline Kim MRN: [id number] DOB/AGE: 75-08-1945 75 y.o.  Admit date: 01/20/2019 Referring Physician: Mal Misty Primary Physician: Georgianne Fick, MD Primary Cardiologist: [name] Reason for Consultation: Anticoagulation  Principal Problem:   Hematoma of left thigh Active Problems:   Hypothyroid   Pacemaker [employer]  (REVO) placed10/23/12   Paroxysmal atrial fibrillation (HCC)   Normocytic normochromic anemia   Hematoma   HPI:  73 y.o. admitted with fall and large left hip hematoma. She is on eliquis chronically for PAF.   This patients CHA2DS2-VASc Score and unadjusted Ischemic Stroke Rate (% per year) is equal to 3.2 % stroke rate/year from a score of 3  Above score calculated as 1 point each if present [CHF, HTN, DM, Vascular=MI/PAD/Aortic Plaque, Age if 65-74, or Female] Above score calculated as 2 points each if present [Age > 75, or Stroke/TIA/TE]  She has PPM followed by Dr Royann Shivers. She was reluctant to stop eliquis without talking to Dr [name]. She is actually in NSR currently and typically is when her PPM is interrogated Her fall was mechanical. I had a long discussion with her and spoke with Dr [name] personally. We both agreed particularly since she is in NSR the risk of stroke is quite low if she were to remain of eliquis for 2 weeks. . She is usually anemic and her Hct has fallen from 32.9 to 27.2.  She has not received blood yet  She has had no dyspnea, palpitations or chest pain. Fall was mechanical using walker and her hands slipped    ROS All other systems reviewed and negative except as noted above  Past Medical History:  Diagnosis Date   Anticoagulant long-term use  WITH PRADAXA 05/27/2011   Anxiety    Cellulitis of buttock, left 12/20/2014   Complication of anesthesia    DIFFICULTY WAKING UP   Depression    Diverticulosis    DJD (degenerative joint disease)    Esophagitis 09/17/06   External  hemorrhoids    GERD (gastroesophageal reflux disease)    Glaucoma    History of stress test    a. 2008 - normal nuc.   Hypertension    Hypothyroid    IBS (irritable bowel syndrome)    Paroxysmal A-fib (HCC) diagnosed 2008   a. On propafenone, Pradaxa.   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   s/p MDT PPM by Dr Rubie Maid    Family History  Problem Relation Age of Onset   Anesthesia problems Mother    Colon polyps Mother        beign   Colon cancer Neg Hx     Social History   Socioeconomic History   Marital status: Divorced    Spouse name: Not on file   Number of children: 3   Years of education: Not on file   Highest education level: Not on file  Occupational History   Occupation: retired    Associate Professor: RETIRED  Social Network engineer strain: Not on file   Food insecurity    Worry: Not on file    Inability: Not on file   Transportation needs    Medical: Not on file    Non-medical: Not on file  Tobacco Use   Smoking status: Former Smoker    Quit date: 08/30/1996    Years since quitting: 22.4   Smokeless tobacco: Never Used  Substance and Sexual Activity  Alcohol use: Yes    Alcohol/week: 6.0 standard drinks    Types: 6 Glasses of wine per week    Comment: daily   Drug use: No   Sexual activity: Yes  Lifestyle   Physical activity    Days per week: Not on file    Minutes per session: Not on file   Stress: Not on file  Relationships   Social connections    Talks on phone: Not on file    Gets together: Not on file    Attends religious service: Not on file    Active member of club or organization: Not on file    Attends meetings of clubs or organizations: Not on file    Relationship status: Not on file   Intimate partner violence    Fear of current or ex partner: Not on file    Emotionally abused: Not on file    Physically abused: Not on file     Forced sexual activity: Not on file  Other Topics Concern   Not on file  Social History Narrative   Lives in [place],  Former Engineer, structural,  Separated.      Past Surgical History:  Procedure Laterality Date   BREAST REDUCTION SURGERY     BUNIONECTOMY Right 07/09/2015   with rods and pins   CARDIOVERSION N/A 08/02/2014   Procedure: CARDIOVERSION;  Surgeon: Sueanne Margarita, MD;  Location: [facility];  Service: Cardiovascular;  Laterality: N/A;   CATARACT EXTRACTION, BILATERAL Bilateral    FACIAL COSMETIC SURGERY     FOOT SURGERY Left    HAND SURGERY     multiple   LIPOSUCTION     NM MYOCAR PERF WALL MOTION  12/29/2006   No significant ischemia; EF 69%   OVARIAN CYST REMOVAL     PACEMAKER INSERTION  04/08/11   MDT Revo implanted by Dr Recardo Evangelist   TONSILLECTOMY     TOTAL KNEE ARTHROPLASTY Left 05/01/2015   Procedure: TOTAL LEFT KNEE ARTHROPLASTY;  Surgeon: Paralee Cancel, MD;  Location: [facility];  Service: Orthopedics;  Laterality: Left;   VAGINAL HYSTERECTOMY        atenolol  25 mg Oral BID   [START ON 01/22/2019] diltiazem  120 mg Oral QHS   docusate sodium  100 mg Oral BID   escitalopram  10 mg Oral Daily   hydroxychloroquine  200 mg Oral Daily   levothyroxine  112 mcg Oral Q0600   metroNIDAZOLE  1 application Topical Daily   midodrine  2.5 mg Oral TID WC   propafenone  150 mg Oral Q24H   propafenone  225 mg Oral BID AC & HS   Tdap  0.5 mL Intramuscular Once    sodium chloride 100 mL/hr at 01/21/19 1121   ciprofloxacin 400 mg (01/21/19 0459)    Physical Exam: Blood pressure (!) 109/49, pulse 62, temperature (!) 97.5 F (36.4 C), temperature source Oral, resp. rate 16, SpO2 92 %.   Pale frail elderly female Laceration sutured on left forehead Lungs clear No murmur  Large over 10 cm tense hematoma with echymosis over left hip No edema  No signs of compartment syndrome   Labs:   Lab Results  Component Value Date   WBC 4.8  01/21/2019   HGB 9.0 (L) 01/21/2019   HCT 27.2 (L) 01/21/2019   MCV 94.1 01/21/2019   PLT 225 01/21/2019    Recent Labs  Lab 01/21/19 0322  NA 129*  K 4.2  CL 97*  CO2 25  BUN 11  CREATININE 0.87  CALCIUM 8.9  GLUCOSE 99   Lab Results  Component Value Date   CKTOTAL 55 09/09/2011   CKMB 2.7 09/09/2011   TROPONINI <0.03 08/01/2014    Lab Results  Component Value Date   CHOL  12/19/2006    172        ATP III CLASSIFICATION:  <200     mg/dL   Desirable  295-621  mg/dL   Borderline High  >=308    mg/dL   High   Lab Results  Component Value Date   HDL 37 (L) 12/19/2006   Lab Results  Component Value Date   LDLCALC  12/19/2006    76        Total Cholesterol/HDL:CHD Risk Coronary Heart Disease Risk Table                     Men   Women  1/2 Average Risk   3.4   3.3   Lab Results  Component Value Date   TRIG 293 (H) 12/19/2006   Lab Results  Component Value Date   CHOLHDL 4.6 12/19/2006   No results found for: LDLDIRECT    Radiology: Dg Thoracic Spine 2 View  Result Date: 01/20/2019 CLINICAL DATA:  Fall EXAM: THORACIC SPINE 2 VIEWS COMPARISON:  08/01/2014 chest x-ray FINDINGS: Minimal scoliosis. Left-sided cardiac pacing leads. Vertebral body heights are maintained. Degenerative changes of the mid to lower thoracic spine with narrowing and osteophyte IMPRESSION: No acute osseous abnormality Electronically Signed   By: Jasmine Pang M.D.   On: 01/20/2019 19:19   Dg Pelvis 1-2 Views  Result Date: 01/20/2019 CLINICAL DATA:  Left hip pain.  Fall. EXAM: PELVIS - 1-2 VIEW COMPARISON:  None. FINDINGS: There is no evidence of pelvic fracture or diastasis. No pelvic bone lesions are seen. IMPRESSION: Negative. Electronically Signed   By: Charlett Nose M.D.   On: 01/20/2019 19:18   Dg Tibia/fibula Left  Result Date: 01/20/2019 CLINICAL DATA:  Fall, left leg pain EXAM: LEFT TIBIA AND FIBULA - 2 VIEW COMPARISON:  None. FINDINGS: Prior left knee replacement. Small bone  fragments are noted off the superior pole of the left patella concerning for avulsed fragments. No tibia or fibular abnormality. IMPRESSION: Small avulsed fragments off the superior pole of the patella. Prior left knee replacement. Electronically Signed   By: Charlett Nose M.D.   On: 01/20/2019 19:21   Dg Tibia/fibula Right  Result Date: 01/20/2019 CLINICAL DATA:  Fall EXAM: RIGHT TIBIA AND FIBULA - 2 VIEW COMPARISON:  None. FINDINGS: There is no evidence of fracture or other focal bone lesions. Soft tissues are unremarkable. IMPRESSION: Negative. Electronically Signed   By: Jasmine Pang M.D.   On: 01/20/2019 19:18   Dg Ankle Complete Left  Result Date: 01/20/2019 CLINICAL DATA:  Fall EXAM: LEFT ANKLE COMPLETE - 3+ VIEW COMPARISON:  None. FINDINGS: There is no evidence of fracture, dislocation, or joint effusion. There is no evidence of arthropathy or other focal bone abnormality. Soft tissues are unremarkable. IMPRESSION: Negative. Electronically Signed   By: Charlett Nose M.D.   On: 01/20/2019 19:21   Ct Head Wo Contrast  Result Date: 01/20/2019 CLINICAL DATA:  Fall.  Head trauma, headache EXAM: CT HEAD WITHOUT CONTRAST CT MAXILLOFACIAL WITHOUT CONTRAST CT CERVICAL SPINE WITHOUT CONTRAST TECHNIQUE: Multidetector CT imaging of the head, cervical spine, and maxillofacial structures were performed using the standard protocol without intravenous contrast. Multiplanar CT image reconstructions of the cervical spine and  maxillofacial structures were also generated. COMPARISON:  05/15/2018 FINDINGS: CT HEAD FINDINGS Brain: See There is atrophy and chronic small vessel disease changes. No acute intracranial abnormality. Specifically, no hemorrhage, hydrocephalus, mass lesion, acute infarction, or significant intracranial injury. Vascular: No hyperdense vessel or unexpected calcification. Skull: No acute calvarial abnormality. Other: None CT MAXILLOFACIAL FINDINGS Osseous: No fracture or mandibular dislocation. No  destructive process. Orbits: Negative. No traumatic or inflammatory finding. Sinuses: Kozel thickening in the maxillary sinuses bilaterally. No air-fluid levels. Soft tissues: Soft tissue swelling/laceration in the left face/orbital region. CT CERVICAL SPINE FINDINGS Alignment: 3 mm of anterolisthesis of C4 on C5 related to facet disease. Skull base and vertebrae: No acute fracture. No primary bone lesion or focal pathologic process. Soft tissues and spinal canal: No prevertebral fluid or swelling. No visible canal hematoma. Disc levels: Diffuse degenerative facet disease. Degenerative disc disease at C5-6 and C6-7. Is moderate disc herniation noted at C4-5. Upper chest: Biapical scarring.  No acute findings. Other: None IMPRESSION: Atrophy, chronic microvascular disease. No acute intracranial abnormality. Cervical spondylosis. Grade 1 anterolisthesis along with disc herniation noted at C4-5. No fracture in the cervical spine or face. Electronically Signed   By: Charlett Nose M.D.   On: 01/20/2019 19:06   Ct Cervical Spine Wo Contrast  Result Date: 01/20/2019 CLINICAL DATA:  Fall.  Head trauma, headache EXAM: CT HEAD WITHOUT CONTRAST CT MAXILLOFACIAL WITHOUT CONTRAST CT CERVICAL SPINE WITHOUT CONTRAST TECHNIQUE: Multidetector CT imaging of the head, cervical spine, and maxillofacial structures were performed using the standard protocol without intravenous contrast. Multiplanar CT image reconstructions of the cervical spine and maxillofacial structures were also generated. COMPARISON:  05/15/2018 FINDINGS: CT HEAD FINDINGS Brain: See There is atrophy and chronic small vessel disease changes. No acute intracranial abnormality. Specifically, no hemorrhage, hydrocephalus, mass lesion, acute infarction, or significant intracranial injury. Vascular: No hyperdense vessel or unexpected calcification. Skull: No acute calvarial abnormality. Other: None CT MAXILLOFACIAL FINDINGS Osseous: No fracture or mandibular  dislocation. No destructive process. Orbits: Negative. No traumatic or inflammatory finding. Sinuses: Kozel thickening in the maxillary sinuses bilaterally. No air-fluid levels. Soft tissues: Soft tissue swelling/laceration in the left face/orbital region. CT CERVICAL SPINE FINDINGS Alignment: 3 mm of anterolisthesis of C4 on C5 related to facet disease. Skull base and vertebrae: No acute fracture. No primary bone lesion or focal pathologic process. Soft tissues and spinal canal: No prevertebral fluid or swelling. No visible canal hematoma. Disc levels: Diffuse degenerative facet disease. Degenerative disc disease at C5-6 and C6-7. Is moderate disc herniation noted at C4-5. Upper chest: Biapical scarring.  No acute findings. Other: None IMPRESSION: Atrophy, chronic microvascular disease. No acute intracranial abnormality. Cervical spondylosis. Grade 1 anterolisthesis along with disc herniation noted at C4-5. No fracture in the cervical spine or face. Electronically Signed   By: Charlett Nose M.D.   On: 01/20/2019 19:06   Ct Abdomen Pelvis W Contrast  Result Date: 12/27/2018 CLINICAL DATA:  60 pound weight loss.  Abdominal pain. EXAM: CT ABDOMEN AND PELVIS WITH CONTRAST TECHNIQUE: Multidetector CT imaging of the abdomen and pelvis was performed using the standard protocol following bolus administration of intravenous contrast. CONTRAST:  45mL ISOVUE-300 IOPAMIDOL (ISOVUE-300) INJECTION 61% COMPARISON:  01/22/2016 FINDINGS: Lower chest: Unremarkable. Hepatobiliary: No suspicious focal abnormality within the liver parenchyma. There is no evidence for gallstones, gallbladder wall thickening, or pericholecystic fluid. No intrahepatic or extrahepatic biliary dilation. Pancreas: Pancreas divisum anatomy evident. Spleen: No splenomegaly. No focal mass lesion. Adrenals/Urinary Tract: No adrenal nodule or mass. Kidneys  unremarkable. No evidence for hydroureter. Tiny bubble noted in the bladder lumen (75/2) which may be  related to recent instrumentation or bladder infection. Stomach/Bowel: Stomach is unremarkable. No gastric wall thickening. No evidence of outlet obstruction. Duodenum is normally positioned as is the ligament of Treitz. No small bowel wall thickening. No small bowel dilatation. The terminal ileum is normal. The appendix is not visualized, but there is no edema or inflammation in the region of the cecum. No gross colonic mass. No colonic wall thickening. Vascular/Lymphatic: There is abdominal aortic atherosclerosis without aneurysm. There is no gastrohepatic or hepatoduodenal ligament lymphadenopathy. No intraperitoneal or retroperitoneal lymphadenopathy. No pelvic sidewall lymphadenopathy. Reproductive: The uterus is surgically absent. There is no adnexal mass. Other: No intraperitoneal free fluid. Musculoskeletal: No worrisome lytic or sclerotic osseous abnormality. IMPRESSION: 1. No acute findings in the abdomen or pelvis. 2.  Aortic Atherosclerois (ICD10-170.0) Electronically Signed   By: Kennith CenterEric  Mansell M.D.   On: 12/27/2018 11:56   Ct Hip Left Wo Contrast  Result Date: 01/20/2019 CLINICAL DATA:  Fall, left hip pain EXAM: CT OF THE LEFT HIP WITHOUT CONTRAST TECHNIQUE: Multidetector CT imaging of the left hip was performed according to the standard protocol. Multiplanar CT image reconstructions were also generated. COMPARISON:  Plain films earlier today FINDINGS: No acute bony abnormality. No fracture. No subluxation or dislocation. Early joint space narrowing and spurring within the left hip joint. There is a large subcutaneous soft tissue hematoma overlying the upper lateral proximal femur measuring up to 7 cm in transverse diameter and approximately 10 cm in craniocaudal length. IMPRESSION: No acute bony abnormality. No proximal femoral fracture. Large soft tissue hematoma overlying the proximal left femur measuring 10 cm in greatest (craniocaudal) length. Electronically Signed   By: Charlett NoseKevin  Dover M.D.   On:  01/20/2019 23:56   Mm 3d Screen Breast Bilateral  Result Date: 12/31/2018 CLINICAL DATA:  Screening. EXAM: DIGITAL SCREENING BILATERAL MAMMOGRAM WITH TOMO AND CAD COMPARISON:  Previous exam(s). ACR Breast Density Category b: There are scattered areas of fibroglandular density. FINDINGS: There are no findings suspicious for malignancy. Images were processed with CAD. IMPRESSION: No mammographic evidence of malignancy. A result letter of this screening mammogram will be mailed directly to the patient. RECOMMENDATION: Screening mammogram in one year. (Code:SM-B-01Y) BI-RADS CATEGORY  1: Negative. Electronically Signed   By: Baird Lyonsina  Arceo M.D.   On: 12/31/2018 12:40   Dg Femur Min 2 Views Left  Result Date: 01/20/2019 CLINICAL DATA:  Fall, left leg pain EXAM: LEFT FEMUR 2 VIEWS COMPARISON:  06/28/2014 FINDINGS: Prior left knee replacement. Small bone fragments are noted off the superior pole of the left patella on the lateral view, concerning for avulsed fracture fragments. No additional bony abnormality. IMPRESSION: Small bone fragments off the superior pole of the patella concerning for avulsed fracture fragments. Prior left knee replacement. Electronically Signed   By: Charlett NoseKevin  Dover M.D.   On: 01/20/2019 19:20   Ct Maxillofacial Wo Contrast  Result Date: 01/20/2019 CLINICAL DATA:  Fall.  Head trauma, headache EXAM: CT HEAD WITHOUT CONTRAST CT MAXILLOFACIAL WITHOUT CONTRAST CT CERVICAL SPINE WITHOUT CONTRAST TECHNIQUE: Multidetector CT imaging of the head, cervical spine, and maxillofacial structures were performed using the standard protocol without intravenous contrast. Multiplanar CT image reconstructions of the cervical spine and maxillofacial structures were also generated. COMPARISON:  05/15/2018 FINDINGS: CT HEAD FINDINGS Brain: See There is atrophy and chronic small vessel disease changes. No acute intracranial abnormality. Specifically, no hemorrhage, hydrocephalus, mass lesion, acute infarction, or  significant intracranial injury. Vascular: No hyperdense vessel or unexpected calcification. Skull: No acute calvarial abnormality. Other: None CT MAXILLOFACIAL FINDINGS Osseous: No fracture or mandibular dislocation. No destructive process. Orbits: Negative. No traumatic or inflammatory finding. Sinuses: Kozel thickening in the maxillary sinuses bilaterally. No air-fluid levels. Soft tissues: Soft tissue swelling/laceration in the left face/orbital region. CT CERVICAL SPINE FINDINGS Alignment: 3 mm of anterolisthesis of C4 on C5 related to facet disease. Skull base and vertebrae: No acute fracture. No primary bone lesion or focal pathologic process. Soft tissues and spinal canal: No prevertebral fluid or swelling. No visible canal hematoma. Disc levels: Diffuse degenerative facet disease. Degenerative disc disease at C5-6 and C6-7. Is moderate disc herniation noted at C4-5. Upper chest: Biapical scarring.  No acute findings. Other: None IMPRESSION: Atrophy, chronic microvascular disease. No acute intracranial abnormality. Cervical spondylosis. Grade 1 anterolisthesis along with disc herniation noted at C4-5. No fracture in the cervical spine or face. Electronically Signed   By: Charlett NoseKevin  Dover M.D.   On: 01/20/2019 19:06    EKG: NSR 01/21/2019    ASSESSMENT AND PLAN:   Anticoagulation: Ok to stop Eliquis I have discussed this with her primary cardiologist Dr Tresa EndoKelly would not resume for 2 weeks   PAF:  In NSR and PPM interrogation indicates she has not had PAF in long time continue meds including propofenone QRS/QT ok  PPM  Normal function has f/u Dr Royann Shiversroitoru soon and he can make sure there is no PAF while off eliquis  Hematoma:  Local care pain meds transfuse as needed for Hb less than 8   Signed: Charlton Hawseter Marcellene Shivley 01/21/2019, 12:15 PM

## 2019-01-21 NOTE — Progress Notes (Signed)
   01/21/19 0256  Vitals  Temp 98.3 F (36.8 C)  Temp Source Oral  BP 115/73  MAP (mmHg) 85  BP Location Left Arm  BP Method Automatic  Patient Position (if appropriate) Lying  Pulse Rate 70  Pulse Rate Source Monitor  Oxygen Therapy  SpO2 100 %  O2 Device Room Air  MEWS Score  MEWS RR 0  MEWS Pulse 0  MEWS Systolic 0  MEWS LOC 0  MEWS Temp 0  MEWS Score 0  MEWS Score Color Green   Pt arrived to 5w from ED in stable condition via stretcher.  She is alert and oriented X4.  Complaining of pain in left hip and left side of her back.  We gave pt a bath and hooked her up to telemetry.  Oriented pt to floor.  Will medicate pt for pain and continue to monitor.

## 2019-01-21 NOTE — Evaluation (Signed)
Physical Therapy Evaluation Patient Details Name: Jacqueline Kim MRN: [id number] DOB: 06/14/1946 Today's Date: 01/21/2019   History of Present Illness  Pt is a 73 y.o. female admitted 01/20/19 after fall sustaining large L hip hematoma and facial laceration. Other imaging clear. PMH includes HTN, afib, pacemaker, glaucoma, DJD, depression.    Clinical Impression  Pt presents with an overall decrease in functional mobility secondary to above. PTA, pt ambulatory with rollator; pt lives alone with family nearby who assist with ADLs and household tasks as needed. Today, pt requiring modA for limited by mobility with RW; pt symptomatic with positive orthostatic hypotension (see values below). Daughter present to discuss d/c recommendations as family unable to provide 24/7 assist that pt currently needs; pt at high risk for falls and readmission. Pt would benefit from continued acute PT services to maximize functional mobility and independence prior to d/c with SNF-level therapies.  Orthostatic BPs  Supine 101/61  Sitting 81/49  Standing *unable due to urinary urgency, then pt needing to sit due to dizziness  Return to supine 86/69       Follow Up Recommendations SNF;Supervision/Assistance - 24 hour    Equipment Recommendations  None recommended by PT    Recommendations for Other Services       Precautions / Restrictions Precautions Precautions: Fall Precaution Comments: Orthostatic hypotension; L hip hematoma Restrictions Weight Bearing Restrictions: No      Mobility  Bed Mobility Overal bed mobility: Needs Assistance Bed Mobility: Supine to Sit;Sit to Supine     Supine to sit: Supervision Sit to supine: Supervision   General bed mobility comments: Cues to perform independently, reliant on use of bed rail to roll and push into sitting  Transfers Overall transfer level: Needs assistance Equipment used: Rolling walker (2 wheeled) Transfers: Sit to/from Stand Sit to Stand: Mod  assist         General transfer comment: Pt requiring increased time to prepare for standing, eventually requiring cues for technique and hand placement; modA to assist trunk elevation standing from bed and BSC. (+) orthostatic hypotension  Ambulation/Gait Ambulation/Gait assistance: Min assist Gait Distance (Feet): 3 Feet Assistive device: Rolling walker (2 wheeled) Gait Pattern/deviations: Step-to pattern;Leaning posteriorly;Trunk flexed Gait velocity: Decreased   General Gait Details: Steps from bed<>BSC with RW and minA for balance; pt attempting to grab PT's hand and leave RW behind, cues for safety. Limited further mobility due to symptomatic hypotension  Stairs            Wheelchair Mobility    Modified Rankin (Stroke Patients Only)       Balance Overall balance assessment: Needs assistance   Sitting balance-Leahy Scale: Fair       Standing balance-Leahy Scale: Poor Standing balance comment: Reliant on UE support and external assist                             Pertinent Vitals/Pain Pain Assessment: Faces Faces Pain Scale: Hurts little more Pain Location: Back > L hip Pain Descriptors / Indicators: Discomfort Pain Intervention(s): Monitored during session    Home Living Family/patient expects to be discharged to:: Private residence Living Arrangements: Alone Available Help at Discharge: Family;Available PRN/intermittently Type of Home: House Home Access: Stairs to enter Entrance Stairs-Rails: None Entrance Stairs-Number of Steps: 1 Home Layout: One level Home Equipment: Walker - 4 wheels;Bedside commode;Hand held shower head;Grab bars - toilet;Grab bars - tub/shower;Wheelchair - manual Additional Comments: Two daughters both live five minutes  away and check on morning/night; both daughters work and cannot provide 24/7    Prior Function Level of Independence: Needs assistance   Gait / Transfers Assistance Needed: Ambulatory with rollator.  Never leaves house alone. Family reports multiple falls  ADL's / Homemaking Assistance Needed: Family assist with bathing, dressing, household tasks, cooking; indep with toileting        Hand Dominance        Extremity/Trunk Assessment   Upper Extremity Assessment Upper Extremity Assessment: Generalized weakness    Lower Extremity Assessment Lower Extremity Assessment: Generalized weakness    Cervical / Trunk Assessment Cervical / Trunk Assessment: Kyphotic  Communication   Communication: No difficulties  Cognition Arousal/Alertness: Awake/alert Behavior During Therapy: WFL for tasks assessed/performed Overall Cognitive Status: History of cognitive impairments - at baseline Area of Impairment: Attention;Memory;Following commands;Safety/judgement;Awareness;Problem solving                   Current Attention Level: Sustained Memory: Decreased short-term memory Following Commands: Follows one step commands with increased time Safety/Judgement: Decreased awareness of safety;Decreased awareness of deficits Awareness: Emergent Problem Solving: Requires verbal cues General Comments: A&O x4 (initially stated [employer]). Decreased attention and memory most notable      General Comments General comments (skin integrity, edema, etc.): Spoke with daughter regarding home vs. SNF - ultimately recommend short-term SNF since family unable to provide initial 24/7 assist; also broached subject of ALF/LTC which she reports family has considered eventually    Exercises     Assessment/Plan    PT Assessment Patient needs continued PT services  PT Problem List Decreased strength;Decreased activity tolerance;Decreased balance;Decreased mobility;Decreased cognition;Decreased knowledge of use of DME;Decreased safety awareness;Cardiopulmonary status limiting activity       PT Treatment Interventions DME instruction;Gait training;Stair training;Functional mobility  training;Therapeutic activities;Therapeutic exercise;Balance training;Patient/family education    PT Goals (Current goals can be found in the Care Plan section)  Acute Rehab PT Goals Patient Stated Goal: Stay living at home alone as long as possible PT Goal Formulation: With patient Time For Goal Achievement: 02/04/19 Potential to Achieve Goals: Fair    Frequency Min 3X/week   Barriers to discharge Decreased caregiver support      Co-evaluation               AM-PAC PT "6 Clicks" Mobility  Outcome Measure Help needed turning from your back to your side while in a flat bed without using bedrails?: None Help needed moving from lying on your back to sitting on the side of a flat bed without using bedrails?: None Help needed moving to and from a bed to a chair (including a wheelchair)?: A Little Help needed standing up from a chair using your arms (e.g., wheelchair or bedside chair)?: A Lot Help needed to walk in hospital room?: A Little Help needed climbing 3-5 steps with a railing? : A Lot 6 Click Score: 18    End of Session Equipment Utilized During Treatment: Gait belt Activity Tolerance: Treatment limited secondary to medical complications (Comment) Patient left: in bed;with call bell/phone within reach;with family/visitor present Nurse Communication: Mobility status PT Visit Diagnosis: Other abnormalities of gait and mobility (R26.89);Muscle weakness (generalized) (M62.81)    Time: 1340-1420 PT Time Calculation (min) (ACUTE ONLY): 40 min   Charges:   PT Evaluation $PT Eval Moderate Complexity: 1 Mod PT Treatments $Therapeutic Activity: 8-22 mins $Self Care/Home Management: 8-22      Ina Homes, PT, DPT Acute Rehabilitation Services  Pager [phone] Office [phone]  Lockie Pares  L Dariana Garbett 01/21/2019, 2:46 PM

## 2019-01-21 NOTE — ED Notes (Signed)
ED TO INPATIENT HANDOFF REPORT  ED Nurse Name and Phone #:  [phone]  S Name/Age/Gender Jacqueline Kim 73 y.o. female Room/Bed: 030C/030C  Code Status   Code Status: Prior  Home/SNF/Other Nursing Home - Independent Living Patient oriented to: self, place, time and situation Is this baseline? Yes   Triage Complete: Triage complete  Chief Complaint Fall/Head Lac  Triage Note Pt states that she fell after her walker "went wonky"; states that she thinks that there is something wrong with her walker's hand break.   Allergies Allergies  Allergen Reactions  . Azithromycin Other (See Comments)    Causes heart rhythm issues Other reaction(s): Other (See Comments) Causes heart rhythm issues  . Epinephrine Other (See Comments)    Causes A-Fib Other reaction(s): Increased Heart Rate (intolerance) States that it caused her heart to beat fast, so she will not take it. Causes A-Fib A-fib Triggers atrial fib   . Penicillins Anaphylaxis    Has patient had a PCN reaction causing immediate rash, facial/tongue/throat swelling, SOB or lightheadedness with hypotension: Yes Has patient had a PCN reaction causing severe rash involving mucus membranes or skin necrosis: No Has patient had a PCN reaction that required hospitalization No Has patient had a PCN reaction occurring within the last 10 years: No If all of the above answers are "NO", then may proceed with Cephalosporin use.   . Lidocaine Palpitations    Or any similar medication Other reaction(s): Other (See Comments) Or any similar medication  . Metoprolol Other (See Comments)    alopecia Other reaction(s): Other (See Comments) alopecia    Level of Care/Admitting Diagnosis ED Disposition    ED Disposition Condition Comment   Admit  The patient appears reasonably stabilized for admission considering the current resources, flow, and capabilities available in the ED at this time, and I doubt any other [facility] requiring further  screening and/or treatment in the ED prior to admission is  present.       B Medical/Surgery History Past Medical History:  Diagnosis Date  . Anticoagulant long-term use  WITH PRADAXA 05/27/2011  . Anxiety   . Cellulitis of buttock, left 12/20/2014  . Complication of anesthesia    DIFFICULTY WAKING UP  . Depression   . Diverticulosis   . DJD (degenerative joint disease)   . Esophagitis 09/17/06  . External hemorrhoids   . GERD (gastroesophageal reflux disease)   . Glaucoma   . History of stress test    a. 2008 - normal nuc.  Marland Kitchen Hypertension   . Hypothyroid   . IBS (irritable bowel syndrome)   . Paroxysmal A-fib [facility]) diagnosed 2008   a. On propafenone, Pradaxa.  . 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 BREAST REDUCTION SURGERY    . BUNIONECTOMY Right 07/09/2015   with rods and pins  . CARDIOVERSION N/A 08/02/2014   Procedure: CARDIOVERSION;  Surgeon: Sueanne Margarita, MD;  Location: [place];  Service: Cardiovascular;  Laterality: N/A;  . CATARACT EXTRACTION, BILATERAL Bilateral   . FACIAL COSMETIC SURGERY    . FOOT SURGERY Left   . HAND SURGERY     multiple  . LIPOSUCTION    . NM MYOCAR PERF WALL MOTION  12/29/2006   No significant ischemia; EF 69%  . OVARIAN CYST REMOVAL    . PACEMAKER INSERTION  04/08/11  MDT Revo implanted by Dr Rubie Maid  . TONSILLECTOMY    . TOTAL KNEE ARTHROPLASTY Left 05/01/2015   Procedure: TOTAL LEFT KNEE ARTHROPLASTY;  Surgeon: Durene Romans, MD;  Location: [facility];  Service: Orthopedics;  Laterality: Left;  Marland Kitchen VAGINAL HYSTERECTOMY       A IV Location/Drains/Wounds Patient Lines/Drains/Airways Status   Active Line/Drains/Airways    Name:   Placement date:   Placement time:   Site:   Days:   Peripheral IV 01/20/19 Left Forearm   01/20/19    1808    Forearm   1   Incision (Closed)  05/01/15 Knee Left   05/01/15    1325     1361   Wound / Incision (Open or Dehisced) 12/20/14 Incision - Open Buttocks Left red, induration, reddness extends past margins from 2 days ago   12/20/14    -    Buttocks   1493          Intake/Output Last 24 hours  Intake/Output Summary (Last 24 hours) at 01/21/2019 0011 Last data filed at 01/20/2019 2235 Gross per 24 hour  Intake 500 ml  Output -  Net 500 ml    Labs/Imaging Results for orders placed or performed during the hospital encounter of 01/20/19 (from the past 48 hour(s))  CBC with Differential     Status: Abnormal   Collection Time: 01/20/19  5:38 PM  Result Value Ref Range   WBC 6.3 4.0 - 10.5 K/uL   RBC 3.23 (L) 3.87 - 5.11 MIL/uL   Hemoglobin 10.1 (L) 12.0 - 15.0 g/dL   HCT 40.9 (L) 81.1 - 91.4 %   MCV 94.7 80.0 - 100.0 fL   MCH 31.3 26.0 - 34.0 pg   MCHC 33.0 30.0 - 36.0 g/dL   RDW 78.2 95.6 - 21.3 %   Platelets 238 150 - 400 K/uL   nRBC 0.0 0.0 - 0.2 %   Neutrophils Relative % 71 %   Neutro Abs 4.5 1.7 - 7.7 K/uL   Lymphocytes Relative 16 %   Lymphs Abs 1.0 0.7 - 4.0 K/uL   Monocytes Relative 11 %   Monocytes Absolute 0.7 0.1 - 1.0 K/uL   Eosinophils Relative 0 %   Eosinophils Absolute 0.0 0.0 - 0.5 K/uL   Basophils Relative 1 %   Basophils Absolute 0.1 0.0 - 0.1 K/uL   Immature Granulocytes 1 %   Abs Immature Granulocytes 0.04 0.00 - 0.07 K/uL    Comment: Performed at [facility], [address] [place], [place] [address]  Basic metabolic panel     Status: Abnormal   Collection Time: 01/20/19  5:38 PM  Result Value Ref Range   Sodium 130 (L) 135 - 145 mmol/L   Potassium 4.1 3.5 - 5.1 mmol/L   Chloride 98 98 - 111 mmol/L   CO2 23 22 - 32 mmol/L   Glucose, Bld 98 70 - 99 mg/dL   BUN 12 8 - 23 mg/dL   Creatinine, Ser 8.46 0.44 - 1.00 mg/dL   Calcium 9.0 8.9 - 96.2 mg/dL   GFR calc non Af Amer >60 >60 mL/min   GFR calc Af Amer >60 >60 mL/min   Anion gap 9 5 - 15    Comment: Performed at [facility], [address], [place], [place] [address]  Protime-INR     Status: Abnormal   Collection Time: 01/20/19  5:38 PM  Result Value Ref Range   Prothrombin Time 16.7 (H) 11.4 -  15.2 seconds   INR 1.4 (H) 0.8 - 1.2    Comment: (NOTE) INR goal varies based on device and disease states. Performed at [facility], [address] [place], [place] [address]   Urinalysis, Routine w reflex microscopic     Status: Abnormal   Collection Time: 01/20/19  9:40 PM  Result Value Ref Range   Color, Urine YELLOW YELLOW   APPearance CLOUDY (A) CLEAR   Specific Gravity, Urine 1.015 1.005 - 1.030   pH 5.0 5.0 - 8.0   Glucose, UA NEGATIVE NEGATIVE mg/dL   Hgb urine dipstick NEGATIVE NEGATIVE   Bilirubin Urine NEGATIVE NEGATIVE   Ketones, ur NEGATIVE NEGATIVE mg/dL   Protein, ur NEGATIVE NEGATIVE mg/dL   Nitrite NEGATIVE NEGATIVE   Leukocytes,Ua SMALL (A) NEGATIVE   RBC / HPF 0-5 0 - 5 RBC/hpf   WBC, UA >50 (H) 0 - 5 WBC/hpf   Bacteria, UA MANY (A) NONE SEEN   Squamous Epithelial / LPF 0-5 0 - 5   Mucus PRESENT    Hyaline Casts, UA PRESENT    Sperm, UA PRESENT     Comment: Performed at [facility], [address] [place], [place] [address]  Hemoglobin and hematocrit, blood     Status: Abnormal   Collection Time: 01/20/19 10:43 PM  Result Value Ref Range   Hemoglobin 9.7 (L) 12.0 - 15.0 g/dL   HCT 19.129.9 (L) 47.836.0 - 29.546.0 %    Comment: Performed at [facility], [address] [place] [place], [place] [address]   Dg Thoracic Spine 2 View  Result Date: 01/20/2019 CLINICAL DATA:  Fall EXAM: THORACIC SPINE 2 VIEWS COMPARISON:  08/01/2014 chest x-ray FINDINGS: Minimal scoliosis. Left-sided cardiac pacing leads. Vertebral body heights are maintained. Degenerative changes of the mid to lower thoracic spine with narrowing and osteophyte IMPRESSION: No acute osseous abnormality Electronically Signed   By: Jasmine PangKim  Fujinaga M.D.   On: 01/20/2019 19:19   Dg Pelvis 1-2 Views  Result Date:  01/20/2019 CLINICAL DATA:  Left hip pain.  Fall. EXAM: PELVIS - 1-2 VIEW COMPARISON:  None. FINDINGS: There is no evidence of pelvic fracture or diastasis. No pelvic bone lesions are seen. IMPRESSION: Negative. Electronically Signed   By: Charlett NoseKevin  Dover M.D.   On: 01/20/2019 19:18   Dg Tibia/fibula Left  Result Date: 01/20/2019 CLINICAL DATA:  Fall, left leg pain EXAM: LEFT TIBIA AND FIBULA - 2 VIEW COMPARISON:  None. FINDINGS: Prior left knee replacement. Small bone fragments are noted off the superior pole of the left patella concerning for avulsed fragments. No tibia or fibular abnormality. IMPRESSION: Small avulsed fragments off the superior pole of the patella. Prior left knee replacement. Electronically Signed   By: Charlett NoseKevin  Dover M.D.   On: 01/20/2019 19:21   Dg Tibia/fibula Right  Result Date: 01/20/2019 CLINICAL DATA:  Fall EXAM: RIGHT TIBIA AND FIBULA - 2 VIEW COMPARISON:  None. FINDINGS: There is no evidence of fracture or other focal bone lesions. Soft tissues are unremarkable. IMPRESSION: Negative. Electronically Signed   By: Jasmine PangKim  Fujinaga M.D.   On: 01/20/2019 19:18   Dg Ankle Complete Left  Result Date: 01/20/2019 CLINICAL DATA:  Fall EXAM: LEFT ANKLE COMPLETE - 3+ VIEW COMPARISON:  None. FINDINGS: There is no evidence of fracture, dislocation, or joint effusion. There is no evidence of arthropathy or other focal bone abnormality. Soft tissues are unremarkable. IMPRESSION: Negative. Electronically Signed   By: Charlett NoseKevin  Dover M.D.   On: 01/20/2019 19:21  Ct Head Wo Contrast  Result Date: 01/20/2019 CLINICAL DATA:  Fall.  Head trauma, headache EXAM: CT HEAD WITHOUT CONTRAST CT MAXILLOFACIAL WITHOUT CONTRAST CT CERVICAL SPINE WITHOUT CONTRAST TECHNIQUE: Multidetector CT imaging of the head, cervical spine, and maxillofacial structures were performed using the standard protocol without intravenous contrast. Multiplanar CT image reconstructions of the cervical spine and maxillofacial structures were  also generated. COMPARISON:  05/15/2018 FINDINGS: CT HEAD FINDINGS Brain: See There is atrophy and chronic small vessel disease changes. No acute intracranial abnormality. Specifically, no hemorrhage, hydrocephalus, mass lesion, acute infarction, or significant intracranial injury. Vascular: No hyperdense vessel or unexpected calcification. Skull: No acute calvarial abnormality. Other: None CT MAXILLOFACIAL FINDINGS Osseous: No fracture or mandibular dislocation. No destructive process. Orbits: Negative. No traumatic or inflammatory finding. Sinuses: Kozel thickening in the maxillary sinuses bilaterally. No air-fluid levels. Soft tissues: Soft tissue swelling/laceration in the left face/orbital region. CT CERVICAL SPINE FINDINGS Alignment: 3 mm of anterolisthesis of C4 on C5 related to facet disease. Skull base and vertebrae: No acute fracture. No primary bone lesion or focal pathologic process. Soft tissues and spinal canal: No prevertebral fluid or swelling. No visible canal hematoma. Disc levels: Diffuse degenerative facet disease. Degenerative disc disease at C5-6 and C6-7. Is moderate disc herniation noted at C4-5. Upper chest: Biapical scarring.  No acute findings. Other: None IMPRESSION: Atrophy, chronic microvascular disease. No acute intracranial abnormality. Cervical spondylosis. Grade 1 anterolisthesis along with disc herniation noted at C4-5. No fracture in the cervical spine or face. Electronically Signed   By: Charlett NoseKevin  Dover M.D.   On: 01/20/2019 19:06   Ct Cervical Spine Wo Contrast  Result Date: 01/20/2019 CLINICAL DATA:  Fall.  Head trauma, headache EXAM: CT HEAD WITHOUT CONTRAST CT MAXILLOFACIAL WITHOUT CONTRAST CT CERVICAL SPINE WITHOUT CONTRAST TECHNIQUE: Multidetector CT imaging of the head, cervical spine, and maxillofacial structures were performed using the standard protocol without intravenous contrast. Multiplanar CT image reconstructions of the cervical spine and maxillofacial structures  were also generated. COMPARISON:  05/15/2018 FINDINGS: CT HEAD FINDINGS Brain: See There is atrophy and chronic small vessel disease changes. No acute intracranial abnormality. Specifically, no hemorrhage, hydrocephalus, mass lesion, acute infarction, or significant intracranial injury. Vascular: No hyperdense vessel or unexpected calcification. Skull: No acute calvarial abnormality. Other: None CT MAXILLOFACIAL FINDINGS Osseous: No fracture or mandibular dislocation. No destructive process. Orbits: Negative. No traumatic or inflammatory finding. Sinuses: Kozel thickening in the maxillary sinuses bilaterally. No air-fluid levels. Soft tissues: Soft tissue swelling/laceration in the left face/orbital region. CT CERVICAL SPINE FINDINGS Alignment: 3 mm of anterolisthesis of C4 on C5 related to facet disease. Skull base and vertebrae: No acute fracture. No primary bone lesion or focal pathologic process. Soft tissues and spinal canal: No prevertebral fluid or swelling. No visible canal hematoma. Disc levels: Diffuse degenerative facet disease. Degenerative disc disease at C5-6 and C6-7. Is moderate disc herniation noted at C4-5. Upper chest: Biapical scarring.  No acute findings. Other: None IMPRESSION: Atrophy, chronic microvascular disease. No acute intracranial abnormality. Cervical spondylosis. Grade 1 anterolisthesis along with disc herniation noted at C4-5. No fracture in the cervical spine or face. Electronically Signed   By: Charlett NoseKevin  Dover M.D.   On: 01/20/2019 19:06   Ct Hip Left Wo Contrast  Result Date: 01/20/2019 CLINICAL DATA:  Fall, left hip pain EXAM: CT OF THE LEFT HIP WITHOUT CONTRAST TECHNIQUE: Multidetector CT imaging of the left hip was performed according to the standard protocol. Multiplanar CT image reconstructions were also generated. COMPARISON:  Plain  films earlier today FINDINGS: No acute bony abnormality. No fracture. No subluxation or dislocation. Early joint space narrowing and spurring  within the left hip joint. There is a large subcutaneous soft tissue hematoma overlying the upper lateral proximal femur measuring up to 7 cm in transverse diameter and approximately 10 cm in craniocaudal length. IMPRESSION: No acute bony abnormality. No proximal femoral fracture. Large soft tissue hematoma overlying the proximal left femur measuring 10 cm in greatest (craniocaudal) length. Electronically Signed   By: Charlett Nose M.D.   On: 01/20/2019 23:56   Dg Femur Min 2 Views Left  Result Date: 01/20/2019 CLINICAL DATA:  Fall, left leg pain EXAM: LEFT FEMUR 2 VIEWS COMPARISON:  06/28/2014 FINDINGS: Prior left knee replacement. Small bone fragments are noted off the superior pole of the left patella on the lateral view, concerning for avulsed fracture fragments. No additional bony abnormality. IMPRESSION: Small bone fragments off the superior pole of the patella concerning for avulsed fracture fragments. Prior left knee replacement. Electronically Signed   By: Charlett Nose M.D.   On: 01/20/2019 19:20   Ct Maxillofacial Wo Contrast  Result Date: 01/20/2019 CLINICAL DATA:  Fall.  Head trauma, headache EXAM: CT HEAD WITHOUT CONTRAST CT MAXILLOFACIAL WITHOUT CONTRAST CT CERVICAL SPINE WITHOUT CONTRAST TECHNIQUE: Multidetector CT imaging of the head, cervical spine, and maxillofacial structures were performed using the standard protocol without intravenous contrast. Multiplanar CT image reconstructions of the cervical spine and maxillofacial structures were also generated. COMPARISON:  05/15/2018 FINDINGS: CT HEAD FINDINGS Brain: See There is atrophy and chronic small vessel disease changes. No acute intracranial abnormality. Specifically, no hemorrhage, hydrocephalus, mass lesion, acute infarction, or significant intracranial injury. Vascular: No hyperdense vessel or unexpected calcification. Skull: No acute calvarial abnormality. Other: None CT MAXILLOFACIAL FINDINGS Osseous: No fracture or mandibular  dislocation. No destructive process. Orbits: Negative. No traumatic or inflammatory finding. Sinuses: Kozel thickening in the maxillary sinuses bilaterally. No air-fluid levels. Soft tissues: Soft tissue swelling/laceration in the left face/orbital region. CT CERVICAL SPINE FINDINGS Alignment: 3 mm of anterolisthesis of C4 on C5 related to facet disease. Skull base and vertebrae: No acute fracture. No primary bone lesion or focal pathologic process. Soft tissues and spinal canal: No prevertebral fluid or swelling. No visible canal hematoma. Disc levels: Diffuse degenerative facet disease. Degenerative disc disease at C5-6 and C6-7. Is moderate disc herniation noted at C4-5. Upper chest: Biapical scarring.  No acute findings. Other: None IMPRESSION: Atrophy, chronic microvascular disease. No acute intracranial abnormality. Cervical spondylosis. Grade 1 anterolisthesis along with disc herniation noted at C4-5. No fracture in the cervical spine or face. Electronically Signed   By: Charlett Nose M.D.   On: 01/20/2019 19:06    Pending Labs Unresulted Labs (From admission, onward)    Start     Ordered   01/20/19 2349  SARS Coronavirus 2 [facility] order, Performed in [facility] hospital lab) Nasopharyngeal Nasopharyngeal Swab  (Symptomatic/High Risk of Exposure/Tier 1 Patients Labs with Precautions)  Once,   STAT    Question Answer Comment  Is this test for diagnosis or screening Diagnosis of ill patient   Symptomatic for COVID-19 as defined by CDC Yes   Date of Symptom Onset 01/20/2019   Hospitalized for COVID-19 No   Admitted to ICU for COVID-19 No   Previously tested for COVID-19 No   Resident in a congregate (group) care setting No   Employed in healthcare setting No   Pregnant No      01/20/19 2348  01/20/19 2144  Urine Culture  Add-on,   AD     01/20/19 2143          Vitals/Pain Today's Vitals   01/20/19 2229 01/20/19 2300 01/21/19 0000 01/21/19 0009  BP: (!) 91/52 105/64 115/69   Pulse:  67 61 68   Resp: 19 20 20    Temp:      TempSrc:      SpO2: 97% 99% 92%   PainSc:    7     Isolation Precautions Airborne and Contact precautions  Medications Medications  Tdap (BOOSTRIX) injection 0.5 mL (0.5 mLs Intramuscular Not Kim 01/20/19 1812)  sodium chloride 0.9 % bolus 500 mL (0 mLs Intravenous Stopped 01/20/19 1953)  ondansetron (ZOFRAN) injection 4 mg (4 mg Intravenous Kim 01/20/19 1816)  morphine 4 MG/ML injection 4 mg (4 mg Intravenous Kim 01/20/19 1816)  lidocaine (PF) (XYLOCAINE) 1 % injection 5 mL (5 mLs Infiltration Kim 01/20/19 1816)  HYDROmorphone (DILAUDID) injection 1 mg (1 mg Intravenous Kim 01/20/19 1956)  sodium chloride 0.9 % bolus 500 mL (0 mLs Intravenous Stopped 01/20/19 2235)  fentaNYL (SUBLIMAZE) injection 50 mcg (50 mcg Intravenous Kim 01/20/19 2316)    Mobility walks with device High fall risk   Focused Assessments Cardiac Assessment Handoff:    Lab Results  Component Value Date   CKTOTAL 55 09/09/2011   CKMB 2.7 09/09/2011   TROPONINI <0.03 08/01/2014   Lab Results  Component Value Date   DDIMER 0.71 (H) 09/09/2011   Does the Patient currently have chest pain? No     R Recommendations: See Admitting Provider Note  Report Kim to:   Additional Notes:  Pt normally independent with ADLs; after fall today unable to bear weight on left leg due to large hematoma.

## 2019-01-21 NOTE — H&P (Addendum)
History and Physical    Jacqueline Kim:[id number] DOB: [birth year] DOA: 01/20/2019  PCP: Georgianne Fick, MD  Patient coming from: Home.  Chief Complaint: Fall.  HPI: LEGACY CARRENDER is a 73 y.o. 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  Patient usually uses a walker due to her arthritis.  Patient states he lost grip of the walker and lost balance and fell onto her side and hit her head but did not lose consciousness.  She developed laceration on the left side of the forehead and also had pain in the left hip area which had a hematoma.  ED Course: In the ER patient had multiple x-rays involving ankle pelvis thoracic spine and left and right leg.  Had a CT of the head C-spine maxillofacial which did not show any acute.  CT of the left hip showed large hematoma measuring 10 cm.  EKG shows sinus rhythm.  Nonspecific ST-T changes.  UA shows more than 50 WBC small leukocyte Estrace.  Lab work showed sodium 130 hemoglobin 10.1.  On-call trauma surgeon Dr. Andrey Campanile was consulted advised hospital admission will be seeing patient in consult.  Patient with a small laceration on the left forehead which was sutured.  Review of Systems: As per HPI, rest all negative.   Past Medical History:  Diagnosis Date   Anticoagulant long-term use  WITH PRADAXA 05/27/2011   Anxiety    Cellulitis of buttock, left 12/20/2014   Complication of anesthesia    DIFFICULTY WAKING UP   Depression    Diverticulosis    DJD (degenerative joint disease)    Esophagitis 09/17/06   External hemorrhoids    GERD (gastroesophageal reflux disease)    Glaucoma    History of stress test    a. 2008 - normal nuc.   Hypertension    Hypothyroid    IBS (irritable bowel syndrome)    Paroxysmal A-fib (HCC) diagnosed 2008   a. On propafenone, Pradaxa.    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   s/p MDT PPM by Dr Rubie Maid    Past Surgical History:  Procedure Laterality Date   BREAST REDUCTION SURGERY     BUNIONECTOMY Right 07/09/2015   with rods and pins   CARDIOVERSION N/A 08/02/2014   Procedure: CARDIOVERSION;  Surgeon: Quintella Reichert, MD;  Location: [facility];  Service: Cardiovascular;  Laterality: N/A;   CATARACT EXTRACTION, BILATERAL Bilateral    FACIAL COSMETIC SURGERY     FOOT SURGERY Left    HAND SURGERY     multiple   LIPOSUCTION     NM MYOCAR PERF WALL MOTION  12/29/2006   No significant ischemia; EF 69%   OVARIAN CYST REMOVAL     PACEMAKER INSERTION  04/08/11   MDT Revo implanted by Dr Rubie Maid   TONSILLECTOMY     TOTAL KNEE ARTHROPLASTY Left 05/01/2015   Procedure: TOTAL LEFT KNEE ARTHROPLASTY;  Surgeon: Durene Romans, MD;  Location: [facility];  Service: Orthopedics;  Laterality: Left;   VAGINAL HYSTERECTOMY       reports that she quit smoking about 22 years ago. She has never used smokeless tobacco. She reports current alcohol use of about 6.0 standard drinks of alcohol per week. She reports that she does not use drugs.  Allergies  Allergen Reactions   Azithromycin Other (See Comments)  Causes heart rhythm issues Other reaction(s): Other (See Comments) Causes heart rhythm issues   Epinephrine Other (See Comments)    Causes A-Fib Other reaction(s): Increased Heart Rate (intolerance) States that it caused her heart to beat fast, so she will not take it. Causes A-Fib A-fib Triggers atrial fib    Penicillins Anaphylaxis    Has patient had a PCN reaction causing immediate rash, facial/tongue/throat swelling, SOB or lightheadedness with hypotension: Yes Has patient had a PCN reaction causing severe rash involving mucus membranes or skin necrosis: No Has patient had a PCN reaction that required hospitalization No Has patient  had a PCN reaction occurring within the last 10 years: No If all of the above answers are "NO", then may proceed with Cephalosporin use.    Lidocaine Palpitations    Or any similar medication Other reaction(s): Other (See Comments) Or any similar medication   Metoprolol Other (See Comments)    alopecia Other reaction(s): Other (See Comments) alopecia    Family History  Problem Relation Age of Onset   Anesthesia problems Mother    Colon polyps Mother        beign   Colon cancer Neg Hx     Prior to Admission medications   Medication Sig Start Date End Date Taking? Authorizing Provider  atenolol (TENORMIN) 25 MG tablet Take 1 tablet (25 mg total) by mouth 2 (two) times daily. 12/30/18  Yes Lennette BihariKelly, Thomas A, MD  dicyclomine (BENTYL) 10 MG capsule TAKE ONE CAPSULE 3 TIMES DAILY BEFORE MEALS Patient taking differently: Take 10 mg by mouth 3 (three) times daily before meals.  03/02/15  Yes Pyrtle, Carie CaddyJay M, MD  diltiazem (CARDIZEM CD) 120 MG 24 hr capsule TAKE ONE CAPSULE BY MOUTH EVERY NIGHT AT BEDTIME Patient taking differently: Take 120 mg by mouth at bedtime.  12/24/18  Yes Croitoru, Mihai, MD  docusate sodium (COLACE) 100 MG capsule Take 1 capsule (100 mg total) by mouth 2 (two) times daily. 05/04/15  Yes Babish, Matthew, PA-C  ELIQUIS 5 MG TABS tablet TAKE 1 TABLET TWICE DAILY Patient taking differently: Take 5 mg by mouth 2 (two) times daily.  01/04/19  Yes Lennette BihariKelly, Thomas A, MD  escitalopram (LEXAPRO) 20 MG tablet Take 10 mg by mouth daily.   Yes [provider]  estradiol (ESTRACE) 0.5 MG tablet Take 0.5 mg by mouth daily.   Yes [provider]  HYDROcodone-acetaminophen (NORCO) 7.5-325 MG tablet Take 1 tablet by mouth every 6 (six) hours as needed for moderate pain.   Yes [provider]  hydroxychloroquine (PLAQUENIL) 200 MG tablet Take 200 mg by mouth daily.   Yes [provider]  levothyroxine (SYNTHROID, LEVOTHROID) 112 MCG tablet Take 112 mcg  by mouth daily before breakfast.    Yes [provider]  linaclotide (LINZESS) 72 MCG capsule Take 1 capsule (72 mcg total) by mouth daily before breakfast. 03/11/16  Yes Nandigam, Eleonore ChiquitoKavitha V, MD  losartan (COZAAR) 25 MG tablet Take 25 mg by mouth 2 (two) times a day.   Yes [provider]  metroNIDAZOLE (METROGEL) 1 % gel Apply 1 application topically daily.  10/08/16  Yes [provider]  midodrine (PROAMATINE) 2.5 MG tablet Take 1 tablet (2.5 mg total) by mouth 3 (three) times daily with meals. 12/30/18  Yes Lennette BihariKelly, Thomas A, MD  Nitroglycerin 0.4 % OINT Place 1 application rectally 3 (three) times daily.   Yes [provider]  propafenone (RYTHMOL) 150 MG tablet TAKE ONE AND ON-HALF TABLETS BY MOUTH  EVERY MORNING, ONE TABLET BY MOUTH MID-DAY, AND ONE AND ONE-HALF TABLETS AT BEDTIME Patient taking differently: Take 150-225 mg by mouth See admin instructions. Take 1 and 1/2 tablets every morning and at bedtime then take 1 tablet at noon 06/07/18  Yes Azalee Course, PA  traZODone (DESYREL) 150 MG tablet Cut the half a tablet in half to make (37.5 mg) Patient taking differently: Take 37.5 mg by mouth at bedtime.  06/28/18  Yes [name], MD  triamcinolone cream (KENALOG) 0.1 % Apply 1 application topically 2 (two) times daily. To face 10/01/16  Yes [provider]  AMBULATORY NON FORMULARY MEDICATION Medication Name: Nitroglycerin ointment .0125% Pea size amount per rectum three times a day Patient not taking: Reported on 01/21/2019 10/14/16   Napoleon Form, MD    Physical Exam: Constitutional: Moderately built and nourished. Vitals:   01/20/19 2229 01/20/19 2300 01/21/19 0000 01/21/19 0130  BP: (!) 91/52 105/64 115/69 (!) 112/58  Pulse: 67 61 68 69  Resp: Temp:      TempSrc:      SpO2: 97% 99% 92% 93%   Eyes: Anicteric no pallor. ENMT: No discharge from the ears eyes nose and mouth. Neck: No mass felt.  No neck  rigidity. Respiratory: No rhonchi or crepitations. Cardiovascular: S1-S2 heard. Abdomen: Soft nontender bowel sounds present. Musculoskeletal: Left hip swelling. Skin: Sutures in the left forehead. Neurologic: Alert awake oriented to time place and person.  Moves all extremities. Psychiatric: Appears normal per normal affect.   Labs on Admission: I have personally reviewed following labs and imaging studies  CBC: Recent Labs  Lab 01/20/19 1738 01/20/19 2243  WBC 6.3  --   NEUTROABS 4.5  --   HGB 10.1* 9.7*  HCT 30.6* 29.9*  MCV 94.7  --   PLT 238  --    Basic Metabolic Panel: Recent Labs  Lab 01/20/19 1738  NA 130*  K 4.1  CL 98  CO2 23  GLUCOSE 98  BUN 12  CREATININE 0.85  CALCIUM 9.0   GFR: CrCl cannot be calculated (Unknown ideal weight.). Liver Function Tests: No results for input(s): AST, ALT, ALKPHOS, BILITOT, PROT, ALBUMIN in the last 168 hours. No results for input(s): LIPASE, AMYLASE in the last 168 hours. No results for input(s): AMMONIA in the last 168 hours. Coagulation Profile: Recent Labs  Lab 01/20/19 1738  INR 1.4*   Cardiac Enzymes: No results for input(s): CKTOTAL, CKMB, CKMBINDEX, TROPONINI in the last 168 hours. BNP (last 3 results) No results for input(s): PROBNP in the last 8760 hours. HbA1C: No results for input(s): HGBA1C in the last 72 hours. CBG: No results for input(s): GLUCAP in the last 168 hours. Lipid Profile: No results for input(s): CHOL, HDL, LDLCALC, TRIG, CHOLHDL, LDLDIRECT in the last 72 hours. Thyroid Function Tests: No results for input(s): TSH, T4TOTAL, FREET4, T3FREE, THYROIDAB in the last 72 hours. Anemia Panel: No results for input(s): VITAMINB12, FOLATE, FERRITIN, TIBC, IRON, RETICCTPCT in the last 72 hours. Urine analysis:    Component Value Date/Time   COLORURINE YELLOW 01/20/2019 2140   APPEARANCEUR CLOUDY (A) 01/20/2019 2140   LABSPEC 1.015 01/20/2019 2140   PHURINE 5.0 01/20/2019 2140   GLUCOSEU  NEGATIVE 01/20/2019 2140   HGBUR NEGATIVE 01/20/2019 2140   BILIRUBINUR NEGATIVE 01/20/2019 2140   KETONESUR NEGATIVE 01/20/2019 2140   PROTEINUR NEGATIVE 01/20/2019 2140   UROBILINOGEN 0.2 04/24/2015 1423   NITRITE NEGATIVE 01/20/2019 2140   LEUKOCYTESUR SMALL (A) 01/20/2019  2140   Sepsis Labs: @LABRCNTIP (procalcitonin:4,lacticidven:4) ) Recent Results (from the past 240 hour(s))  SARS Coronavirus 2 [facility] order, Performed in [facility] hospital lab) Nasopharyngeal Nasopharyngeal Swab     Status: None   Collection Time: 01/20/19 11:55 PM   Specimen: Nasopharyngeal Swab  Result Value Ref Range Status   SARS Coronavirus 2 NEGATIVE NEGATIVE Final    Comment: (NOTE) If result is NEGATIVE SARS-CoV-2 target nucleic acids are NOT DETECTED. The SARS-CoV-2 RNA is generally detectable in upper and lower  respiratory specimens during the acute phase of infection. The lowest  concentration of SARS-CoV-2 viral copies this assay can detect is 250  copies / mL. A negative result does not preclude SARS-CoV-2 infection  and should not be used as the sole basis for treatment or other  patient management decisions.  A negative result may occur with  improper specimen collection / handling, submission of specimen other  than nasopharyngeal swab, presence of viral mutation(s) within the  areas targeted by this assay, and inadequate number of viral copies  (<250 copies / mL). A negative result must be combined with clinical  observations, patient history, and epidemiological information. If result is POSITIVE SARS-CoV-2 target nucleic acids are DETECTED. The SARS-CoV-2 RNA is generally detectable in upper and lower  respiratory specimens dur ing the acute phase of infection.  Positive  results are indicative of active infection with SARS-CoV-2.  Clinical  correlation with patient history and other diagnostic information is  necessary to determine patient infection status.  Positive results do   not rule out bacterial infection or co-infection with other viruses. If result is PRESUMPTIVE POSTIVE SARS-CoV-2 nucleic acids MAY BE PRESENT.   A presumptive positive result was obtained on the submitted specimen  and confirmed on repeat testing.  While 2019 novel coronavirus  (SARS-CoV-2) nucleic acids may be present in the submitted sample  additional confirmatory testing may be necessary for epidemiological  and / or clinical management purposes  to differentiate between  SARS-CoV-2 and other Sarbecovirus currently known to infect humans.  If clinically indicated additional testing with an alternate test  methodology [phone]) is advised. The SARS-CoV-2 RNA is generally  detectable in upper and lower respiratory sp ecimens during the acute  phase of infection. The expected result is Negative. Fact Sheet for Patients:  StrictlyIdeas.no Fact Sheet for Healthcare Providers: BankingDealers.co.za This test is not yet approved or cleared by the [place] FDA and has been authorized for detection and/or diagnosis of SARS-CoV-2 by FDA under an Emergency Use Authorization (EUA).  This EUA will remain in effect (meaning this test can be used) for the duration of the COVID-19 declaration under Section 564(b)(1) of the Act, 21 U.S.C. section 360bbb-3(b)(1), unless the authorization is terminated or revoked sooner. Performed at [facility], [place] [address], [place], [place] [address]      Radiological Exams on Admission: Dg Thoracic Spine 2 View  Result Date: 01/20/2019 CLINICAL DATA:  Fall EXAM: THORACIC SPINE 2 VIEWS COMPARISON:  08/01/2014 chest x-ray FINDINGS: Minimal scoliosis. Left-sided cardiac pacing leads. Vertebral body heights are maintained. Degenerative changes of the mid to lower thoracic spine with narrowing and osteophyte IMPRESSION: No acute osseous abnormality Electronically Signed   By: Donavan Foil M.D.   On:  01/20/2019 19:19   Dg Pelvis 1-2 Views  Result Date: 01/20/2019 CLINICAL DATA:  Left hip pain.  Fall. EXAM: PELVIS - 1-2 VIEW COMPARISON:  None. FINDINGS: There is no evidence of pelvic fracture or diastasis. No pelvic  bone lesions are seen. IMPRESSION: Negative. Electronically Signed   By: Charlett NoseKevin  Dover M.D.   On: 01/20/2019 19:18   Dg Tibia/fibula Left  Result Date: 01/20/2019 CLINICAL DATA:  Fall, left leg pain EXAM: LEFT TIBIA AND FIBULA - 2 VIEW COMPARISON:  None. FINDINGS: Prior left knee replacement. Small bone fragments are noted off the superior pole of the left patella concerning for avulsed fragments. No tibia or fibular abnormality. IMPRESSION: Small avulsed fragments off the superior pole of the patella. Prior left knee replacement. Electronically Signed   By: Charlett NoseKevin  Dover M.D.   On: 01/20/2019 19:21   Dg Tibia/fibula Right  Result Date: 01/20/2019 CLINICAL DATA:  Fall EXAM: RIGHT TIBIA AND FIBULA - 2 VIEW COMPARISON:  None. FINDINGS: There is no evidence of fracture or other focal bone lesions. Soft tissues are unremarkable. IMPRESSION: Negative. Electronically Signed   By: Jasmine PangKim  Fujinaga M.D.   On: 01/20/2019 19:18   Dg Ankle Complete Left  Result Date: 01/20/2019 CLINICAL DATA:  Fall EXAM: LEFT ANKLE COMPLETE - 3+ VIEW COMPARISON:  None. FINDINGS: There is no evidence of fracture, dislocation, or joint effusion. There is no evidence of arthropathy or other focal bone abnormality. Soft tissues are unremarkable. IMPRESSION: Negative. Electronically Signed   By: Charlett NoseKevin  Dover M.D.   On: 01/20/2019 19:21   Ct Head Wo Contrast  Result Date: 01/20/2019 CLINICAL DATA:  Fall.  Head trauma, headache EXAM: CT HEAD WITHOUT CONTRAST CT MAXILLOFACIAL WITHOUT CONTRAST CT CERVICAL SPINE WITHOUT CONTRAST TECHNIQUE: Multidetector CT imaging of the head, cervical spine, and maxillofacial structures were performed using the standard protocol without intravenous contrast. Multiplanar CT image reconstructions  of the cervical spine and maxillofacial structures were also generated. COMPARISON:  05/15/2018 FINDINGS: CT HEAD FINDINGS Brain: See There is atrophy and chronic small vessel disease changes. No acute intracranial abnormality. Specifically, no hemorrhage, hydrocephalus, mass lesion, acute infarction, or significant intracranial injury. Vascular: No hyperdense vessel or unexpected calcification. Skull: No acute calvarial abnormality. Other: None CT MAXILLOFACIAL FINDINGS Osseous: No fracture or mandibular dislocation. No destructive process. Orbits: Negative. No traumatic or inflammatory finding. Sinuses: Kozel thickening in the maxillary sinuses bilaterally. No air-fluid levels. Soft tissues: Soft tissue swelling/laceration in the left face/orbital region. CT CERVICAL SPINE FINDINGS Alignment: 3 mm of anterolisthesis of C4 on C5 related to facet disease. Skull base and vertebrae: No acute fracture. No primary bone lesion or focal pathologic process. Soft tissues and spinal canal: No prevertebral fluid or swelling. No visible canal hematoma. Disc levels: Diffuse degenerative facet disease. Degenerative disc disease at C5-6 and C6-7. Is moderate disc herniation noted at C4-5. Upper chest: Biapical scarring.  No acute findings. Other: None IMPRESSION: Atrophy, chronic microvascular disease. No acute intracranial abnormality. Cervical spondylosis. Grade 1 anterolisthesis along with disc herniation noted at C4-5. No fracture in the cervical spine or face. Electronically Signed   By: Charlett NoseKevin  Dover M.D.   On: 01/20/2019 19:06   Ct Cervical Spine Wo Contrast  Result Date: 01/20/2019 CLINICAL DATA:  Fall.  Head trauma, headache EXAM: CT HEAD WITHOUT CONTRAST CT MAXILLOFACIAL WITHOUT CONTRAST CT CERVICAL SPINE WITHOUT CONTRAST TECHNIQUE: Multidetector CT imaging of the head, cervical spine, and maxillofacial structures were performed using the standard protocol without intravenous contrast. Multiplanar CT image  reconstructions of the cervical spine and maxillofacial structures were also generated. COMPARISON:  05/15/2018 FINDINGS: CT HEAD FINDINGS Brain: See There is atrophy and chronic small vessel disease changes. No acute intracranial abnormality. Specifically, no hemorrhage, hydrocephalus, mass lesion, acute infarction, or  significant intracranial injury. Vascular: No hyperdense vessel or unexpected calcification. Skull: No acute calvarial abnormality. Other: None CT MAXILLOFACIAL FINDINGS Osseous: No fracture or mandibular dislocation. No destructive process. Orbits: Negative. No traumatic or inflammatory finding. Sinuses: Kozel thickening in the maxillary sinuses bilaterally. No air-fluid levels. Soft tissues: Soft tissue swelling/laceration in the left face/orbital region. CT CERVICAL SPINE FINDINGS Alignment: 3 mm of anterolisthesis of C4 on C5 related to facet disease. Skull base and vertebrae: No acute fracture. No primary bone lesion or focal pathologic process. Soft tissues and spinal canal: No prevertebral fluid or swelling. No visible canal hematoma. Disc levels: Diffuse degenerative facet disease. Degenerative disc disease at C5-6 and C6-7. Is moderate disc herniation noted at C4-5. Upper chest: Biapical scarring.  No acute findings. Other: None IMPRESSION: Atrophy, chronic microvascular disease. No acute intracranial abnormality. Cervical spondylosis. Grade 1 anterolisthesis along with disc herniation noted at C4-5. No fracture in the cervical spine or face. Electronically Signed   By: Charlett Nose M.D.   On: 01/20/2019 19:06   Ct Hip Left Wo Contrast  Result Date: 01/20/2019 CLINICAL DATA:  Fall, left hip pain EXAM: CT OF THE LEFT HIP WITHOUT CONTRAST TECHNIQUE: Multidetector CT imaging of the left hip was performed according to the standard protocol. Multiplanar CT image reconstructions were also generated. COMPARISON:  Plain films earlier today FINDINGS: No acute bony abnormality. No fracture. No  subluxation or dislocation. Early joint space narrowing and spurring within the left hip joint. There is a large subcutaneous soft tissue hematoma overlying the upper lateral proximal femur measuring up to 7 cm in transverse diameter and approximately 10 cm in craniocaudal length. IMPRESSION: No acute bony abnormality. No proximal femoral fracture. Large soft tissue hematoma overlying the proximal left femur measuring 10 cm in greatest (craniocaudal) length. Electronically Signed   By: Charlett Nose M.D.   On: 01/20/2019 23:56   Dg Femur Min 2 Views Left  Result Date: 01/20/2019 CLINICAL DATA:  Fall, left leg pain EXAM: LEFT FEMUR 2 VIEWS COMPARISON:  06/28/2014 FINDINGS: Prior left knee replacement. Small bone fragments are noted off the superior pole of the left patella on the lateral view, concerning for avulsed fracture fragments. No additional bony abnormality. IMPRESSION: Small bone fragments off the superior pole of the patella concerning for avulsed fracture fragments. Prior left knee replacement. Electronically Signed   By: Charlett Nose M.D.   On: 01/20/2019 19:20   Ct Maxillofacial Wo Contrast  Result Date: 01/20/2019 CLINICAL DATA:  Fall.  Head trauma, headache EXAM: CT HEAD WITHOUT CONTRAST CT MAXILLOFACIAL WITHOUT CONTRAST CT CERVICAL SPINE WITHOUT CONTRAST TECHNIQUE: Multidetector CT imaging of the head, cervical spine, and maxillofacial structures were performed using the standard protocol without intravenous contrast. Multiplanar CT image reconstructions of the cervical spine and maxillofacial structures were also generated. COMPARISON:  05/15/2018 FINDINGS: CT HEAD FINDINGS Brain: See There is atrophy and chronic small vessel disease changes. No acute intracranial abnormality. Specifically, no hemorrhage, hydrocephalus, mass lesion, acute infarction, or significant intracranial injury. Vascular: No hyperdense vessel or unexpected calcification. Skull: No acute calvarial abnormality. Other: None  CT MAXILLOFACIAL FINDINGS Osseous: No fracture or mandibular dislocation. No destructive process. Orbits: Negative. No traumatic or inflammatory finding. Sinuses: Kozel thickening in the maxillary sinuses bilaterally. No air-fluid levels. Soft tissues: Soft tissue swelling/laceration in the left face/orbital region. CT CERVICAL SPINE FINDINGS Alignment: 3 mm of anterolisthesis of C4 on C5 related to facet disease. Skull base and vertebrae: No acute fracture. No primary bone lesion or focal pathologic  process. Soft tissues and spinal canal: No prevertebral fluid or swelling. No visible canal hematoma. Disc levels: Diffuse degenerative facet disease. Degenerative disc disease at C5-6 and C6-7. Is moderate disc herniation noted at C4-5. Upper chest: Biapical scarring.  No acute findings. Other: None IMPRESSION: Atrophy, chronic microvascular disease. No acute intracranial abnormality. Cervical spondylosis. Grade 1 anterolisthesis along with disc herniation noted at C4-5. No fracture in the cervical spine or face. Electronically Signed   By: Charlett Nose M.D.   On: 01/20/2019 19:06    EKG: Independently reviewed.  Normal sinus rhythm with nonspecific changes.  Assessment/Plan Principal Problem:   Hematoma of left thigh Active Problems:   Hypothyroid   Pacemaker [employer]  (REVO) placed10/23/12   Paroxysmal atrial fibrillation (HCC)   Normocytic normochromic anemia   Hematoma    1. Left hip/thigh hematoma status post mechanical fall on Eliquis -trauma surgeon Dr. Andrey Campanile will be seeing patient in consult.  Advised serial check of CBC.  Also closely monitor and make sure patient left thigh circumference.  No definite evidence of any compartment syndrome at this time.  After discussing with patient and patient's daughter they are agreeable on holding Eliquis for now. 2. History of paroxysmal atrial fibrillation and tachybradycardia syndrome status post pacemaker placement on propafenone Cardizem and  metoprolol.  Eliquis on hold due to bleed. 3. Anemia appears to be chronic will need to check serial CBC to rule out blood loss. 4. Hypothyroidism on Synthroid. 5. Rheumatoid arthritis on Plaquenil. 6. Possible UTI we will keep patient on ceftriaxone check urine cultures. 7. Possible small avulsion fraction of the left patella will await trauma surgeon recommendation. 8. History of orthostatic hypotension on midodrine.  Will hold Cozaar for now  Since patient has a large hematoma and is on anticoagulation patient will need close follow-up and inpatient status.   DVT prophylaxis: SCDs due to active bleed. Code Status: Full code. Family Communication: Patient's daughter. Disposition Plan: Probably home. Consults called: Trauma surgeon. Admission status: Observation.   Eduard Clos MD [facility] Pager [phone].  If 7PM-7AM, please contact night-coverage [email or web address] Password [email or web address]  01/21/2019, 1:54 AM

## 2019-01-21 NOTE — Progress Notes (Signed)
Patient seen and examined at bedside, patient admitted after midnight, please see earlier detailed admission note by Rise Patience, MD. Briefly, patient presented a fall and hematoma. Serial CBCs. Cardiology for recommendations for anticoagulation. PT recommending SNF.   Cordelia Poche, MD [facility] 01/21/2019, 5:32 PM

## 2019-01-21 NOTE — Progress Notes (Signed)
Pharmacy Antibiotic Note  Jacqueline Kim is a 73 y.o. female admitted on 01/20/2019 with UTI.  Pharmacy has been consulted for cipro dosing.  Plan: Cipro 400mg  IV q12 hours F/u renal function, cultures and clinical course    Temp (24hrs), Avg:98.4 F (36.9 C), Min:98.4 F (36.9 C), Max:98.4 F (36.9 C)  Recent Labs  Lab 01/20/19 1738  WBC 6.3  CREATININE 0.85    CrCl cannot be calculated (Unknown ideal weight.).    Allergies  Allergen Reactions  . Azithromycin Other (See Comments)    Causes heart rhythm issues Other reaction(s): Other (See Comments) Causes heart rhythm issues  . Epinephrine Other (See Comments)    Causes A-Fib Other reaction(s): Increased Heart Rate (intolerance) States that it caused her heart to beat fast, so she will not take it. Causes A-Fib A-fib Triggers atrial fib   . Penicillins Anaphylaxis    Has patient had a PCN reaction causing immediate rash, facial/tongue/throat swelling, SOB or lightheadedness with hypotension: Yes Has patient had a PCN reaction causing severe rash involving mucus membranes or skin necrosis: No Has patient had a PCN reaction that required hospitalization No Has patient had a PCN reaction occurring within the last 10 years: No If all of the above answers are "NO", then may proceed with Cephalosporin use.   . Lidocaine Palpitations    Or any similar medication Other reaction(s): Other (See Comments) Or any similar medication  . Metoprolol Other (See Comments)    alopecia Other reaction(s): Other (See Comments) alopecia    Thank you for allowing pharmacy to be a part of this patient's care.  Excell Seltzer Poteet 01/21/2019 2:11 AM

## 2019-01-21 NOTE — ED Notes (Signed)
Hematoma left upper thigh measuers 20 cm x 30 cm and is marked.

## 2019-01-21 NOTE — Consult Note (Signed)
Jacqueline Kim Aug 27, 1945  [id number].    Requesting MD: Dr. Jodean Lima Chief Complaint/Reason for Consult: left hip hematoma  HPI:  This is a 73 yo white female who has multiple medical problems who is anticoagulated for her A fib on Eliquis.  She mobilizes with a walker around her house.  She fell yesterday, she thinks, due to a malfunction of the wheels on her walker.  She hit her left hip and left side of her face on the floor. She had a left eyebrow laceration that was repaired in the ED.  She was also noted to have a large left hip hematoma.  She underwent a CT scan that confirmed this but did not show any active extravasation.  Her admitting hgb was 10.1.  Follow up hgb after 6 hrs was only down to 9.7.  Her eliquis has been stopped currently.  The patient states she has already mobilized today by herself.  She states the pain in her left hip is already better than yesterday.  We have been asked to see her for any further recommendations.  ROS: ROS: Please see HPI, otherwise negative.  Family History  Problem Relation Age of Onset   Anesthesia problems Mother    Colon polyps Mother        beign   Colon cancer Neg Hx     Past Medical History:  Diagnosis Date   Anticoagulant long-term use  WITH PRADAXA 05/27/2011   Anxiety    Cellulitis of buttock, left 12/20/2014   Complication of anesthesia    DIFFICULTY WAKING UP   Depression    Diverticulosis    DJD (degenerative joint disease)    Esophagitis 09/17/06   External hemorrhoids    GERD (gastroesophageal reflux disease)    Glaucoma    History of stress test    a. 2008 - normal nuc.   Hypertension    Hypothyroid    IBS (irritable bowel syndrome)    Paroxysmal A-fib (HCC) diagnosed 2008   a. On propafenone, Pradaxa.   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   s/p MDT PPM by Dr Rubie Maid    Past Surgical History:  Procedure  Laterality Date   BREAST REDUCTION SURGERY     BUNIONECTOMY Right 07/09/2015   with rods and pins   CARDIOVERSION N/A 08/02/2014   Procedure: CARDIOVERSION;  Surgeon: Quintella Reichert, MD;  Location: [facility];  Service: Cardiovascular;  Laterality: N/A;   CATARACT EXTRACTION, BILATERAL Bilateral    FACIAL COSMETIC SURGERY     FOOT SURGERY Left    HAND SURGERY     multiple   LIPOSUCTION     NM MYOCAR PERF WALL MOTION  12/29/2006   No significant ischemia; EF 69%   OVARIAN CYST REMOVAL     PACEMAKER INSERTION  04/08/11   MDT Revo implanted by Dr Rubie Maid   TONSILLECTOMY     TOTAL KNEE ARTHROPLASTY Left 05/01/2015   Procedure: TOTAL LEFT KNEE ARTHROPLASTY;  Surgeon: Durene Romans, MD;  Location: [facility];  Service: Orthopedics;  Laterality: Left;   VAGINAL HYSTERECTOMY      Social History:  reports that she quit smoking about 22 years ago. She has never used smokeless tobacco. She reports current alcohol use of about 6.0 standard drinks of alcohol per week. She reports that she does not use drugs.  Allergies:  Allergies  Allergen Reactions   Azithromycin Other (See Comments)  Causes heart rhythm issues Other reaction(s): Other (See Comments) Causes heart rhythm issues   Epinephrine Other (See Comments)    Causes A-Fib Other reaction(s): Increased Heart Rate (intolerance) States that it caused her heart to beat fast, so she will not take it. Causes A-Fib A-fib Triggers atrial fib    Penicillins Anaphylaxis    Has patient had a PCN reaction causing immediate rash, facial/tongue/throat swelling, SOB or lightheadedness with hypotension: Yes Has patient had a PCN reaction causing severe rash involving mucus membranes or skin necrosis: No Has patient had a PCN reaction that required hospitalization No Has patient had a PCN reaction occurring within the last 10 years: No If all of the above answers are "NO", then may proceed with Cephalosporin use.    Lidocaine  Palpitations    Or any similar medication Other reaction(s): Other (See Comments) Or any similar medication   Metoprolol Other (See Comments)    alopecia Other reaction(s): Other (See Comments) alopecia    Facility-Administered Medications Prior to Admission  Medication Dose Route Frequency Provider Last Rate Last Dose   0.9 %  sodium chloride infusion  500 mL Intravenous Continuous Nandigam, Eleonore Chiquito, MD       Medications Prior to Admission  Medication Sig Dispense Refill   atenolol (TENORMIN) 25 MG tablet Take 1 tablet (25 mg total) by mouth 2 (two) times daily. 90 tablet 1   dicyclomine (BENTYL) 10 MG capsule TAKE ONE CAPSULE 3 TIMES DAILY BEFORE MEALS (Patient taking differently: Take 10 mg by mouth 3 (three) times daily before meals. ) 90 capsule 1   diltiazem (CARDIZEM CD) 120 MG 24 hr capsule TAKE ONE CAPSULE BY MOUTH EVERY NIGHT AT BEDTIME (Patient taking differently: Take 120 mg by mouth at bedtime. ) 90 capsule 3   docusate sodium (COLACE) 100 MG capsule Take 1 capsule (100 mg total) by mouth 2 (two) times daily. 10 capsule 0   ELIQUIS 5 MG TABS tablet TAKE 1 TABLET TWICE DAILY (Patient taking differently: Take 5 mg by mouth 2 (two) times daily. ) 60 tablet 2   escitalopram (LEXAPRO) 20 MG tablet Take 10 mg by mouth daily.     estradiol (ESTRACE) 0.5 MG tablet Take 0.5 mg by mouth daily.     HYDROcodone-acetaminophen (NORCO) 7.5-325 MG tablet Take 1 tablet by mouth every 6 (six) hours as needed for moderate pain.     hydroxychloroquine (PLAQUENIL) 200 MG tablet Take 200 mg by mouth daily.     levothyroxine (SYNTHROID, LEVOTHROID) 112 MCG tablet Take 112 mcg by mouth daily before breakfast.      linaclotide (LINZESS) 72 MCG capsule Take 1 capsule (72 mcg total) by mouth daily before breakfast. 30 capsule 3   losartan (COZAAR) 25 MG tablet Take 25 mg by mouth 2 (two) times a day.     metroNIDAZOLE (METROGEL) 1 % gel Apply 1 application topically daily.       midodrine (PROAMATINE) 2.5 MG tablet Take 1 tablet (2.5 mg total) by mouth 3 (three) times daily with meals. 180 tablet 3   Nitroglycerin 0.4 % OINT Place 1 application rectally 3 (three) times daily.     propafenone (RYTHMOL) 150 MG tablet TAKE ONE AND ON-HALF TABLETS BY MOUTH EVERY MORNING, ONE TABLET BY MOUTH MID-DAY, AND ONE AND ONE-HALF TABLETS AT BEDTIME (Patient taking differently: Take 150-225 mg by mouth See admin instructions. Take 1 and 1/2 tablets every morning and at bedtime then take 1 tablet at noon) 360 tablet 1   traZODone (  DESYREL) 150 MG tablet Cut the half a tablet in half to make (37.5 mg) (Patient taking differently: Take 37.5 mg by mouth at bedtime. ) 30 tablet 1   triamcinolone cream (KENALOG) 0.1 % Apply 1 application topically 2 (two) times daily. To face     AMBULATORY NON FORMULARY MEDICATION Medication Name: Nitroglycerin ointment .0125% Pea size amount per rectum three times a day (Patient not taking: Reported on 01/21/2019) 30 g 3     Physical Exam: Blood pressure (!) 109/49, pulse 62, temperature (!) 97.5 F (36.4 C), temperature source Oral, resp. rate 16, SpO2 92 %. General: frail elderly appearing white female who is laying in bed in NAD HEENT: head is normocephalic, with a 3cm laceration to the left eyebrow which has been sutured.  Ecchymosis noted lateral to the left eye.  Sclera are noninjected.  PERRL.  Ears and nose without any masses or lesions.  Mouth is pink and moist Heart: regular, rate, and rhythm.  Normal s1,s2. No obvious murmurs, gallops, or rubs noted. Pacemaker in place. Palpable radial and pedal pulses bilaterally Lungs: CTAB, no wheezes, rhonchi, or rales noted.  Respiratory effort nonlabored Abd: soft, NT, ND, +BS, no masses, hernias, or organomegaly MS: all 4 extremities are symmetrical with no cyanosis, clubbing, or edema, except the left hip over the greater trochanter which reveals a large hematoma.  This is soft and relatively nontender  to palpation.  No skin tightness or necrosis. Psych: A&Ox3 with an appropriate affect.   Results for orders placed or performed during the hospital encounter of 01/20/19 (from the past 48 hour(s))  CBC with Differential     Status: Abnormal   Collection Time: 01/20/19  5:38 PM  Result Value Ref Range   WBC 6.3 4.0 - 10.5 K/uL   RBC 3.23 (L) 3.87 - 5.11 MIL/uL   Hemoglobin 10.1 (L) 12.0 - 15.0 g/dL   HCT 30.6 (L) 36.0 - 46.0 %   MCV 94.7 80.0 - 100.0 fL   MCH 31.3 26.0 - 34.0 pg   MCHC 33.0 30.0 - 36.0 g/dL   RDW 12.7 11.5 - 15.5 %   Platelets 238 150 - 400 K/uL   nRBC 0.0 0.0 - 0.2 %   Neutrophils Relative % 71 %   Neutro Abs 4.5 1.7 - 7.7 K/uL   Lymphocytes Relative 16 %   Lymphs Abs 1.0 0.7 - 4.0 K/uL   Monocytes Relative 11 %   Monocytes Absolute 0.7 0.1 - 1.0 K/uL   Eosinophils Relative 0 %   Eosinophils Absolute 0.0 0.0 - 0.5 K/uL   Basophils Relative 1 %   Basophils Absolute 0.1 0.0 - 0.1 K/uL   Immature Granulocytes 1 %   Abs Immature Granulocytes 0.04 0.00 - 0.07 K/uL    Comment: Performed at [facility], [address] [place], [place] [address]  Basic metabolic panel     Status: Abnormal   Collection Time: 01/20/19  5:38 PM  Result Value Ref Range   Sodium 130 (L) 135 - 145 mmol/L   Potassium 4.1 3.5 - 5.1 mmol/L   Chloride 98 98 - 111 mmol/L   CO2 23 22 - 32 mmol/L   Glucose, Bld 98 70 - 99 mg/dL   BUN 12 8 - 23 mg/dL   Creatinine, Ser 0.85 0.44 - 1.00 mg/dL   Calcium 9.0 8.9 - 10.3 mg/dL   GFR calc non Af Amer >60 >60 mL/min   GFR calc Af Amer >60 >60 mL/min  Anion gap 9 5 - 15    Comment: Performed at [facility], [address], [place], [place] [address]  Protime-INR     Status: Abnormal   Collection Time: 01/20/19  5:38 PM  Result Value Ref Range   Prothrombin Time 16.7 (H) 11.4 - 15.2 seconds   INR 1.4 (H) 0.8 - 1.2    Comment: (NOTE) INR goal varies based on device and disease states. Performed at [facility], [address] [place], [place] [address]   Urinalysis, Routine w reflex microscopic     Status: Abnormal   Collection Time: 01/20/19  9:40 PM  Result Value Ref Range   Color, Urine YELLOW YELLOW   APPearance CLOUDY (A) CLEAR   Specific Gravity, Urine 1.015 1.005 - 1.030   pH 5.0 5.0 - 8.0   Glucose, UA NEGATIVE NEGATIVE mg/dL   Hgb urine dipstick NEGATIVE NEGATIVE   Bilirubin Urine NEGATIVE NEGATIVE   Ketones, ur NEGATIVE NEGATIVE mg/dL   Protein, ur NEGATIVE NEGATIVE mg/dL   Nitrite NEGATIVE NEGATIVE   Leukocytes,Ua SMALL (A) NEGATIVE   RBC / HPF 0-5 0 - 5 RBC/hpf   WBC, UA >50 (H) 0 - 5 WBC/hpf   Bacteria, UA MANY (A) NONE SEEN   Squamous Epithelial / LPF 0-5 0 - 5   Mucus PRESENT    Hyaline Casts, UA PRESENT    Sperm, UA PRESENT     Comment: Performed at [facility], [address] [place], [place] [address]  Hemoglobin and hematocrit, blood     Status: Abnormal   Collection Time: 01/20/19 10:43 PM  Result Value Ref Range   Hemoglobin 9.7 (L) 12.0 - 15.0 g/dL   HCT 21.3 (L) 08.6 - 57.8 %    Comment: Performed at [facility], [address] [place], [place] [address]  SARS Coronavirus 2 [facility] order, Performed in [facility] hospital lab) Nasopharyngeal Nasopharyngeal Swab     Status: None   Collection Time: 01/20/19 11:55 PM   Specimen: Nasopharyngeal Swab  Result Value Ref Range   SARS Coronavirus 2 NEGATIVE NEGATIVE    Comment: (NOTE) If result is NEGATIVE SARS-CoV-2 target nucleic acids are NOT DETECTED. The SARS-CoV-2 RNA is generally detectable in upper and lower  respiratory specimens during the acute phase of infection. The lowest  concentration of SARS-CoV-2 viral copies this assay can detect is 250  copies / mL. A negative result does not preclude SARS-CoV-2 infection  and should not be used as the sole basis for treatment or other  patient management decisions.  A negative result may occur with  improper specimen collection / handling, submission of  specimen other  than nasopharyngeal swab, presence of viral mutation(s) within the  areas targeted by this assay, and inadequate number of viral copies  (<250 copies / mL). A negative result must be combined with clinical  observations, patient history, and epidemiological information. If result is POSITIVE SARS-CoV-2 target nucleic acids are DETECTED. The SARS-CoV-2 RNA is generally detectable in upper and lower  respiratory specimens dur ing the acute phase of infection.  Positive  results are indicative of active infection with SARS-CoV-2.  Clinical  correlation with patient history and other diagnostic information is  necessary to determine patient infection status.  Positive results do  not rule out bacterial infection or co-infection with other viruses. If result is PRESUMPTIVE POSTIVE SARS-CoV-2 nucleic acids MAY BE PRESENT.   A presumptive positive result was obtained on the  submitted specimen  and confirmed on repeat testing.  While 2019 novel coronavirus  (SARS-CoV-2) nucleic acids may be present in the submitted sample  additional confirmatory testing may be necessary for epidemiological  and / or clinical management purposes  to differentiate between  SARS-CoV-2 and other Sarbecovirus currently known to infect humans.  If clinically indicated additional testing with an alternate test  methodology [phone]) is advised. The SARS-CoV-2 RNA is generally  detectable in upper and lower respiratory sp ecimens during the acute  phase of infection. The expected result is Negative. Fact Sheet for Patients:  BoilerBrush.com.cy Fact Sheet for Healthcare Providers: https://pope.com/ This test is not yet approved or cleared by the [place] FDA and has been authorized for detection and/or diagnosis of SARS-CoV-2 by FDA under an Emergency Use Authorization (EUA).  This EUA will remain in effect (meaning this test can be used) for the  duration of the COVID-19 declaration under Section 564(b)(1) of the Act, 21 U.S.C. section 360bbb-3(b)(1), unless the authorization is terminated or revoked sooner. Performed at [facility], [address] [place], [place] [address]   CBC     Status: Abnormal   Collection Time: 01/21/19  3:22 AM  Result Value Ref Range   WBC 4.8 4.0 - 10.5 K/uL   RBC 2.89 (L) 3.87 - 5.11 MIL/uL   Hemoglobin 9.0 (L) 12.0 - 15.0 g/dL   HCT 94.4 (L) 96.7 - 59.1 %   MCV 94.1 80.0 - 100.0 fL   MCH 31.1 26.0 - 34.0 pg   MCHC 33.1 30.0 - 36.0 g/dL   RDW 63.8 46.6 - 59.9 %   Platelets 225 150 - 400 K/uL   nRBC 0.0 0.0 - 0.2 %    Comment: Performed at [facility], [address] [place], [place] [address]  Type and screen [facility]     Status: None   Collection Time: 01/21/19  3:22 AM  Result Value Ref Range   ABO/RH(D) A POS    Antibody Screen NEG    Sample Expiration      01/24/2019,2359 Performed at [facility], [address] [place], [place] [address]   Basic metabolic panel     Status: Abnormal   Collection Time: 01/21/19  3:22 AM  Result Value Ref Range   Sodium 129 (L) 135 - 145 mmol/L   Potassium 4.2 3.5 - 5.1 mmol/L   Chloride 97 (L) 98 - 111 mmol/L   CO2 25 22 - 32 mmol/L   Glucose, Bld 99 70 - 99 mg/dL   BUN 11 8 - 23 mg/dL   Creatinine, Ser 0.30 0.44 - 1.00 mg/dL   Calcium 8.9 8.9 - 09.2 mg/dL   GFR calc non Af Amer >60 >60 mL/min   GFR calc Af Amer >60 >60 mL/min   Anion gap 7 5 - 15    Comment: Performed at [facility], [address], [place], [place] [address]   Dg Thoracic Spine 2 View  Result Date: 01/20/2019 CLINICAL DATA:  Fall EXAM: THORACIC SPINE 2 VIEWS COMPARISON:  08/01/2014 chest x-ray FINDINGS: Minimal scoliosis. Left-sided cardiac pacing leads. Vertebral body heights are maintained. Degenerative changes of the mid to lower thoracic spine with narrowing and osteophyte IMPRESSION: No acute osseous abnormality  Electronically Signed   By: Jasmine Pang M.D.   On: 01/20/2019 19:19   Dg Pelvis 1-2 Views  Result Date: 01/20/2019 CLINICAL DATA:  Left hip pain.  Fall. EXAM: PELVIS -  1-2 VIEW COMPARISON:  None. FINDINGS: There is no evidence of pelvic fracture or diastasis. No pelvic bone lesions are seen. IMPRESSION: Negative. Electronically Signed   By: Charlett Nose M.D.   On: 01/20/2019 19:18   Dg Tibia/fibula Left  Result Date: 01/20/2019 CLINICAL DATA:  Fall, left leg pain EXAM: LEFT TIBIA AND FIBULA - 2 VIEW COMPARISON:  None. FINDINGS: Prior left knee replacement. Small bone fragments are noted off the superior pole of the left patella concerning for avulsed fragments. No tibia or fibular abnormality. IMPRESSION: Small avulsed fragments off the superior pole of the patella. Prior left knee replacement. Electronically Signed   By: Charlett Nose M.D.   On: 01/20/2019 19:21   Dg Tibia/fibula Right  Result Date: 01/20/2019 CLINICAL DATA:  Fall EXAM: RIGHT TIBIA AND FIBULA - 2 VIEW COMPARISON:  None. FINDINGS: There is no evidence of fracture or other focal bone lesions. Soft tissues are unremarkable. IMPRESSION: Negative. Electronically Signed   By: Jasmine Pang M.D.   On: 01/20/2019 19:18   Dg Ankle Complete Left  Result Date: 01/20/2019 CLINICAL DATA:  Fall EXAM: LEFT ANKLE COMPLETE - 3+ VIEW COMPARISON:  None. FINDINGS: There is no evidence of fracture, dislocation, or joint effusion. There is no evidence of arthropathy or other focal bone abnormality. Soft tissues are unremarkable. IMPRESSION: Negative. Electronically Signed   By: Charlett Nose M.D.   On: 01/20/2019 19:21   Ct Head Wo Contrast  Result Date: 01/20/2019 CLINICAL DATA:  Fall.  Head trauma, headache EXAM: CT HEAD WITHOUT CONTRAST CT MAXILLOFACIAL WITHOUT CONTRAST CT CERVICAL SPINE WITHOUT CONTRAST TECHNIQUE: Multidetector CT imaging of the head, cervical spine, and maxillofacial structures were performed using the standard protocol without  intravenous contrast. Multiplanar CT image reconstructions of the cervical spine and maxillofacial structures were also generated. COMPARISON:  05/15/2018 FINDINGS: CT HEAD FINDINGS Brain: See There is atrophy and chronic small vessel disease changes. No acute intracranial abnormality. Specifically, no hemorrhage, hydrocephalus, mass lesion, acute infarction, or significant intracranial injury. Vascular: No hyperdense vessel or unexpected calcification. Skull: No acute calvarial abnormality. Other: None CT MAXILLOFACIAL FINDINGS Osseous: No fracture or mandibular dislocation. No destructive process. Orbits: Negative. No traumatic or inflammatory finding. Sinuses: Kozel thickening in the maxillary sinuses bilaterally. No air-fluid levels. Soft tissues: Soft tissue swelling/laceration in the left face/orbital region. CT CERVICAL SPINE FINDINGS Alignment: 3 mm of anterolisthesis of C4 on C5 related to facet disease. Skull base and vertebrae: No acute fracture. No primary bone lesion or focal pathologic process. Soft tissues and spinal canal: No prevertebral fluid or swelling. No visible canal hematoma. Disc levels: Diffuse degenerative facet disease. Degenerative disc disease at C5-6 and C6-7. Is moderate disc herniation noted at C4-5. Upper chest: Biapical scarring.  No acute findings. Other: None IMPRESSION: Atrophy, chronic microvascular disease. No acute intracranial abnormality. Cervical spondylosis. Grade 1 anterolisthesis along with disc herniation noted at C4-5. No fracture in the cervical spine or face. Electronically Signed   By: Charlett Nose M.D.   On: 01/20/2019 19:06   Ct Cervical Spine Wo Contrast  Result Date: 01/20/2019 CLINICAL DATA:  Fall.  Head trauma, headache EXAM: CT HEAD WITHOUT CONTRAST CT MAXILLOFACIAL WITHOUT CONTRAST CT CERVICAL SPINE WITHOUT CONTRAST TECHNIQUE: Multidetector CT imaging of the head, cervical spine, and maxillofacial structures were performed using the standard protocol  without intravenous contrast. Multiplanar CT image reconstructions of the cervical spine and maxillofacial structures were also generated. COMPARISON:  05/15/2018 FINDINGS: CT HEAD FINDINGS Brain: See There is atrophy and chronic  small vessel disease changes. No acute intracranial abnormality. Specifically, no hemorrhage, hydrocephalus, mass lesion, acute infarction, or significant intracranial injury. Vascular: No hyperdense vessel or unexpected calcification. Skull: No acute calvarial abnormality. Other: None CT MAXILLOFACIAL FINDINGS Osseous: No fracture or mandibular dislocation. No destructive process. Orbits: Negative. No traumatic or inflammatory finding. Sinuses: Kozel thickening in the maxillary sinuses bilaterally. No air-fluid levels. Soft tissues: Soft tissue swelling/laceration in the left face/orbital region. CT CERVICAL SPINE FINDINGS Alignment: 3 mm of anterolisthesis of C4 on C5 related to facet disease. Skull base and vertebrae: No acute fracture. No primary bone lesion or focal pathologic process. Soft tissues and spinal canal: No prevertebral fluid or swelling. No visible canal hematoma. Disc levels: Diffuse degenerative facet disease. Degenerative disc disease at C5-6 and C6-7. Is moderate disc herniation noted at C4-5. Upper chest: Biapical scarring.  No acute findings. Other: None IMPRESSION: Atrophy, chronic microvascular disease. No acute intracranial abnormality. Cervical spondylosis. Grade 1 anterolisthesis along with disc herniation noted at C4-5. No fracture in the cervical spine or face. Electronically Signed   By: Charlett NoseKevin  Dover M.D.   On: 01/20/2019 19:06   Ct Hip Left Wo Contrast  Result Date: 01/20/2019 CLINICAL DATA:  Fall, left hip pain EXAM: CT OF THE LEFT HIP WITHOUT CONTRAST TECHNIQUE: Multidetector CT imaging of the left hip was performed according to the standard protocol. Multiplanar CT image reconstructions were also generated. COMPARISON:  Plain films earlier today  FINDINGS: No acute bony abnormality. No fracture. No subluxation or dislocation. Early joint space narrowing and spurring within the left hip joint. There is a large subcutaneous soft tissue hematoma overlying the upper lateral proximal femur measuring up to 7 cm in transverse diameter and approximately 10 cm in craniocaudal length. IMPRESSION: No acute bony abnormality. No proximal femoral fracture. Large soft tissue hematoma overlying the proximal left femur measuring 10 cm in greatest (craniocaudal) length. Electronically Signed   By: Charlett NoseKevin  Dover M.D.   On: 01/20/2019 23:56   Dg Femur Min 2 Views Left  Result Date: 01/20/2019 CLINICAL DATA:  Fall, left leg pain EXAM: LEFT FEMUR 2 VIEWS COMPARISON:  06/28/2014 FINDINGS: Prior left knee replacement. Small bone fragments are noted off the superior pole of the left patella on the lateral view, concerning for avulsed fracture fragments. No additional bony abnormality. IMPRESSION: Small bone fragments off the superior pole of the patella concerning for avulsed fracture fragments. Prior left knee replacement. Electronically Signed   By: Charlett NoseKevin  Dover M.D.   On: 01/20/2019 19:20   Ct Maxillofacial Wo Contrast  Result Date: 01/20/2019 CLINICAL DATA:  Fall.  Head trauma, headache EXAM: CT HEAD WITHOUT CONTRAST CT MAXILLOFACIAL WITHOUT CONTRAST CT CERVICAL SPINE WITHOUT CONTRAST TECHNIQUE: Multidetector CT imaging of the head, cervical spine, and maxillofacial structures were performed using the standard protocol without intravenous contrast. Multiplanar CT image reconstructions of the cervical spine and maxillofacial structures were also generated. COMPARISON:  05/15/2018 FINDINGS: CT HEAD FINDINGS Brain: See There is atrophy and chronic small vessel disease changes. No acute intracranial abnormality. Specifically, no hemorrhage, hydrocephalus, mass lesion, acute infarction, or significant intracranial injury. Vascular: No hyperdense vessel or unexpected  calcification. Skull: No acute calvarial abnormality. Other: None CT MAXILLOFACIAL FINDINGS Osseous: No fracture or mandibular dislocation. No destructive process. Orbits: Negative. No traumatic or inflammatory finding. Sinuses: Kozel thickening in the maxillary sinuses bilaterally. No air-fluid levels. Soft tissues: Soft tissue swelling/laceration in the left face/orbital region. CT CERVICAL SPINE FINDINGS Alignment: 3 mm of anterolisthesis of C4 on C5 related  to facet disease. Skull base and vertebrae: No acute fracture. No primary bone lesion or focal pathologic process. Soft tissues and spinal canal: No prevertebral fluid or swelling. No visible canal hematoma. Disc levels: Diffuse degenerative facet disease. Degenerative disc disease at C5-6 and C6-7. Is moderate disc herniation noted at C4-5. Upper chest: Biapical scarring.  No acute findings. Other: None IMPRESSION: Atrophy, chronic microvascular disease. No acute intracranial abnormality. Cervical spondylosis. Grade 1 anterolisthesis along with disc herniation noted at C4-5. No fracture in the cervical spine or face. Electronically Signed   By: Charlett NoseKevin  Dover M.D.   On: 01/20/2019 19:06      Assessment/Plan Fall A fib - eliquis on hold.  Agree with cards to hold for 2 weeks. ABL anemia - hgb 10 on admit down to 8.1 currently.  Likely secondary to blood loss and equilibration.  Transfuse per cards recommendations.   Left hip hematoma - hematoma is soft on exam with minimal pain.  She state she has already mobilized today.  She may mobilize as able.  Hold eliquis per cards recommendations.  Transfuse as recommended by cards.  No active extravasation noted on her CT scan.  This will reabsorb over time.  Not acute surgical intervention warranted.  If wanted, you could wrap a binder around this, but I do not think that is really necessary at this time. Patient is stable.  No further trauma recommendations.  Defer to primary service on further care.  We  will sign off.   Letha CapeKelly E Zyana Amaro, [facility] 01/21/2019, 1:17 PM Pager: [phone]

## 2019-01-22 DIAGNOSIS — D649 Anemia, unspecified: Secondary | ICD-10-CM

## 2019-01-22 DIAGNOSIS — E039 Hypothyroidism, unspecified: Secondary | ICD-10-CM

## 2019-01-22 DIAGNOSIS — S7012XD Contusion of left thigh, subsequent encounter: Secondary | ICD-10-CM

## 2019-01-22 LAB — CBC
HCT: 24.8 % — ABNORMAL LOW (ref 36.0–46.0)
HCT: 26.4 % — ABNORMAL LOW (ref 36.0–46.0)
Hemoglobin: 8.1 g/dL — ABNORMAL LOW (ref 12.0–15.0)
Hemoglobin: 8.3 g/dL — ABNORMAL LOW (ref 12.0–15.0)
MCH: 30.2 pg (ref 26.0–34.0)
MCH: 30.5 pg (ref 26.0–34.0)
MCHC: 32.7 g/dL (ref 30.0–36.0)
MCHC: 31.4 g/dL (ref 30.0–36.0)
MCV: 92.5 fL (ref 80.0–100.0)
MCV: 97.1 fL (ref 80.0–100.0)
Platelets: 183 10*3/uL (ref 150–400)
Platelets: 173 10*3/uL (ref 150–400)
RBC: 2.68 MIL/uL — ABNORMAL LOW (ref 3.87–5.11)
RBC: 2.72 MIL/uL — ABNORMAL LOW (ref 3.87–5.11)
RDW: 13.8 % (ref 11.5–15.5)
RDW: 14.2 % (ref 11.5–15.5)
WBC: 4.8 10*3/uL (ref 4.0–10.5)
WBC: 6 10*3/uL (ref 4.0–10.5)
nRBC: 0 % (ref 0.0–0.2)
nRBC: 0 % (ref 0.0–0.2)

## 2019-01-22 LAB — BASIC METABOLIC PANEL
Anion gap: 7 (ref 5–15)
BUN: 8 mg/dL (ref 8–23)
CO2: 23 mmol/L (ref 22–32)
Calcium: 8.4 mg/dL — ABNORMAL LOW (ref 8.9–10.3)
Chloride: 99 mmol/L (ref 98–111)
Creatinine, Ser: 0.78 mg/dL (ref 0.44–1.00)
GFR calc Af Amer: >60 mL/min (ref >60)
GFR calc non Af Amer: >60 mL/min (ref >60)
Glucose, Bld: 87 mg/dL (ref 70–99)
Potassium: 4.5 mmol/L (ref 3.5–5.1)
Sodium: 129 mmol/L — ABNORMAL LOW (ref 135–145)

## 2019-01-22 MED ORDER — LORAZEPAM 2 MG/ML IJ SOLN
0.5000 mg | Freq: Once | INTRAMUSCULAR | Status: AC
  Administered 2019-01-22: 0.5 mg via INTRAVENOUS
  Filled 2019-01-22: qty 1

## 2019-01-22 NOTE — Progress Notes (Signed)
   01/22/19 1049  Urine Characteristics  Urinary Interventions Bladder scan  Bladder Scan Volume (mL) 511 mL   Dr. Lonny Prude paged to notify.

## 2019-01-22 NOTE — Progress Notes (Addendum)
PROGRESS NOTE    Jacqueline Kim  YTK:[id number] DOB: 09/16/1945 DOA: 01/20/2019 PCP: Merrilee Seashore, MD   Brief Narrative: Jacqueline Kim is a 73 y.o. 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 Patient presented secondary to a fall suffering a thigh hematoma and left periorbital laceration.   Assessment & Plan:   Principal Problem:   Hematoma of left thigh Active Problems:   Hypothyroid   Pacemaker [employer]  (REVO) placed10/23/12   Paroxysmal atrial fibrillation (HCC)   Normocytic normochromic anemia   Hematoma   Left thigh hematoma Secondary to trauma. Significant blood loss but hematoma appears stable. Worse in setting of Eliquis use. -Continue observation -Serial CBCs  Fall Resultant left periorbital laceration and hematoma.  Paroxysmal atrial fibrillation Tachybrady syndrome S/p pacemaker -Continue diltiazem, atenolol, propafenone -Hold Eliquis for 2 weeks  Acute blood loss anemia Chronic anemia Secondary to hematoma. As mentioned above.  Hypothyroidism -Continue Synthroid  UTI Started on ceftriaxone. Urine culture significant for E. Coli. Associated urinary retention -Continue Ciprofloxacin -Follow-up culture sensitivities  Rheumatoid arthritis -Continue Plaquenil  Abnormal telemetry Received call about abnormal telemetry. EKG obtained without evidence of ST changes. No chest pain.   DVT prophylaxis: SCDs Code Status:   Code Status: Full Code Family Communication: None at bedside Disposition Plan: Discharge to SNF when medically stable with stable hemoglobin   Consultants:   Cardiology  General surgery/Trauma  Procedures:   None  Antimicrobials:  Ciprofloxacin   Subjective: No issues overnight  Objective: Vitals:   01/22/19 0255 01/22/19 0455 01/22/19 0913 01/22/19 1331  BP: 116/60 117/65 135/69 (!)  161/82  Pulse: 73 72 78 73  Resp: 20 18  17   Temp: 99.1 F (37.3 C) 98.8 F (37.1 C) 98.2 F (36.8 C) 98 F (36.7 C)  TempSrc: Oral Oral Oral Oral  SpO2: 94% 95% 95% 100%    Intake/Output Summary (Last 24 hours) at 01/22/2019 1440 Last data filed at 01/22/2019 1344 Gross per 24 hour  Intake 2980.79 ml  Output 1375 ml  Net 1605.79 ml   There were no vitals filed for this visit.  Examination:  General exam: Appears calm and comfortable Respiratory system: Clear to auscultation. Respiratory effort normal. Cardiovascular system: S1 & S2 heard, RRR. No murmurs, rubs, gallops or clicks. Gastrointestinal system: Abdomen is nondistended, soft and nontender. No organomegaly or masses felt. Normal bowel sounds heard. Central nervous system: Alert and oriented. No focal neurological deficits. Extremities: No edema. No calf tenderness. Left thigh hematoma appears reduced Skin: No cyanosis. No rashes Psychiatry: Judgement and insight appear normal. Mood & affect appropriate.     Data Reviewed: I have personally reviewed following labs and imaging studies  CBC: Recent Labs  Lab 01/20/19 1738 01/20/19 2243 01/21/19 0322 01/21/19 1151 01/21/19 1651 01/22/19 0800  WBC 6.3  --  4.8 4.9 4.6 4.8  NEUTROABS 4.5  --   --   --   --   --   HGB 10.1* 9.7* 9.0* 8.1* 7.0* 8.1*  HCT 30.6* 29.9* 27.2* 24.1* 21.0* 24.8*  MCV 94.7  --  94.1 92.7 92.9 92.5  PLT 238  --  225 193 190 557   Basic Metabolic Panel: Recent Labs  Lab 01/20/19 1738 01/21/19 0322 01/22/19 0800  NA 130* 129* 129*  K 4.1 4.2 4.5  CL 98 97* 99  CO2 23 25 23   GLUCOSE 98 99 87  BUN 12 11 8   CREATININE 0.85  0.87 0.78  CALCIUM 9.0 8.9 8.4*   GFR: CrCl cannot be calculated (Unknown ideal weight.). Liver Function Tests: No results for input(s): AST, ALT, ALKPHOS, BILITOT, PROT, ALBUMIN in the last 168 hours. No results for input(s): LIPASE, AMYLASE in the last 168 hours. No results for input(s): AMMONIA in the last  168 hours. Coagulation Profile: Recent Labs  Lab 01/20/19 1738  INR 1.4*   Cardiac Enzymes: No results for input(s): CKTOTAL, CKMB, CKMBINDEX, TROPONINI in the last 168 hours. BNP (last 3 results) No results for input(s): PROBNP in the last 8760 hours. HbA1C: No results for input(s): HGBA1C in the last 72 hours. CBG: No results for input(s): GLUCAP in the last 168 hours. Lipid Profile: No results for input(s): CHOL, HDL, LDLCALC, TRIG, CHOLHDL, LDLDIRECT in the last 72 hours. Thyroid Function Tests: No results for input(s): TSH, T4TOTAL, FREET4, T3FREE, THYROIDAB in the last 72 hours. Anemia Panel: No results for input(s): VITAMINB12, FOLATE, FERRITIN, TIBC, IRON, RETICCTPCT in the last 72 hours. Sepsis Labs: No results for input(s): PROCALCITON, LATICACIDVEN in the last 168 hours.  Recent Results (from the past 240 hour(s))  Urine Culture     Status: Abnormal (Preliminary result)   Collection Time: 01/20/19  9:44 PM   Specimen: Urine, Random  Result Value Ref Range Status   Specimen Description URINE, RANDOM  Final   Special Requests NONE  Final   Culture (A)  Final    >=100,000 COLONIES/mL GRAM NEGATIVE RODS IDENTIFICATION AND SUSCEPTIBILITIES TO FOLLOW Performed at [facility], [address] [place], [place] [address]    Report Status PENDING  Incomplete  SARS Coronavirus 2 [facility](Hospital order, Performed in [facility]) Nasopharyngeal Nasopharyngeal Swab     Status: None   Collection Time: 01/20/19 11:55 PM   Specimen: Nasopharyngeal Swab  Result Value Ref Range Status   SARS Coronavirus 2 NEGATIVE NEGATIVE Final    Comment: (NOTE) If result is NEGATIVE SARS-CoV-2 target nucleic acids are NOT DETECTED. The SARS-CoV-2 RNA is generally detectable in upper and lower  respiratory specimens during the acute phase of infection. The lowest  concentration of SARS-CoV-2 viral copies this assay can detect is 250  copies / mL. A negative result does not  preclude SARS-CoV-2 infection  and should not be used as the sole basis for treatment or other  patient management decisions.  A negative result may occur with  improper specimen collection / handling, submission of specimen other  than nasopharyngeal swab, presence of viral mutation(s) within the  areas targeted by this assay, and inadequate number of viral copies  (<250 copies / mL). A negative result must be combined with clinical  observations, patient history, and epidemiological information. If result is POSITIVE SARS-CoV-2 target nucleic acids are DETECTED. The SARS-CoV-2 RNA is generally detectable in upper and lower  respiratory specimens dur ing the acute phase of infection.  Positive  results are indicative of active infection with SARS-CoV-2.  Clinical  correlation with patient history and other diagnostic information is  necessary to determine patient infection status.  Positive results do  not rule out bacterial infection or co-infection with other viruses. If result is PRESUMPTIVE POSTIVE SARS-CoV-2 nucleic acids MAY BE PRESENT.   A presumptive positive result was obtained on the submitted specimen  and confirmed on repeat testing.  While 2019 novel coronavirus  (SARS-CoV-2) nucleic acids may be present in the submitted sample  additional confirmatory testing may be necessary for epidemiological  and / or clinical management purposes  to differentiate between  SARS-CoV-2 and other Sarbecovirus currently known to infect humans.  If clinically indicated additional testing with an alternate test  methodology [phone]) is advised. The SARS-CoV-2 RNA is generally  detectable in upper and lower respiratory sp ecimens during the acute  phase of infection. The expected result is Negative. Fact Sheet for Patients:  BoilerBrush.com.cy Fact Sheet for Healthcare Providers: https://pope.com/ This test is not yet approved or cleared by  the [place] FDA and has been authorized for detection and/or diagnosis of SARS-CoV-2 by FDA under an Emergency Use Authorization (EUA).  This EUA will remain in effect (meaning this test can be used) for the duration of the COVID-19 declaration under Section 564(b)(1) of the Act, 21 U.S.C. section 360bbb-3(b)(1), unless the authorization is terminated or revoked sooner. Performed at [facility], [address], [place], [place] [address]          Radiology Studies: Dg Thoracic Spine 2 View  Result Date: 01/20/2019 CLINICAL DATA:  Fall EXAM: THORACIC SPINE 2 VIEWS COMPARISON:  08/01/2014 chest x-ray FINDINGS: Minimal scoliosis. Left-sided cardiac pacing leads. Vertebral body heights are maintained. Degenerative changes of the mid to lower thoracic spine with narrowing and osteophyte IMPRESSION: No acute osseous abnormality Electronically Signed   By: Jasmine Pang M.D.   On: 01/20/2019 19:19   Dg Pelvis 1-2 Views  Result Date: 01/20/2019 CLINICAL DATA:  Left hip pain.  Fall. EXAM: PELVIS - 1-2 VIEW COMPARISON:  None. FINDINGS: There is no evidence of pelvic fracture or diastasis. No pelvic bone lesions are seen. IMPRESSION: Negative. Electronically Signed   By: Charlett Nose M.D.   On: 01/20/2019 19:18   Dg Tibia/fibula Left  Result Date: 01/20/2019 CLINICAL DATA:  Fall, left leg pain EXAM: LEFT TIBIA AND FIBULA - 2 VIEW COMPARISON:  None. FINDINGS: Prior left knee replacement. Small bone fragments are noted off the superior pole of the left patella concerning for avulsed fragments. No tibia or fibular abnormality. IMPRESSION: Small avulsed fragments off the superior pole of the patella. Prior left knee replacement. Electronically Signed   By: Charlett Nose M.D.   On: 01/20/2019 19:21   Dg Tibia/fibula Right  Result Date: 01/20/2019 CLINICAL DATA:  Fall EXAM: RIGHT TIBIA AND FIBULA - 2 VIEW COMPARISON:  None. FINDINGS: There is no evidence of fracture or other focal bone lesions.  Soft tissues are unremarkable. IMPRESSION: Negative. Electronically Signed   By: Jasmine Pang M.D.   On: 01/20/2019 19:18   Dg Ankle Complete Left  Result Date: 01/20/2019 CLINICAL DATA:  Fall EXAM: LEFT ANKLE COMPLETE - 3+ VIEW COMPARISON:  None. FINDINGS: There is no evidence of fracture, dislocation, or joint effusion. There is no evidence of arthropathy or other focal bone abnormality. Soft tissues are unremarkable. IMPRESSION: Negative. Electronically Signed   By: Charlett Nose M.D.   On: 01/20/2019 19:21   Ct Head Wo Contrast  Result Date: 01/20/2019 CLINICAL DATA:  Fall.  Head trauma, headache EXAM: CT HEAD WITHOUT CONTRAST CT MAXILLOFACIAL WITHOUT CONTRAST CT CERVICAL SPINE WITHOUT CONTRAST TECHNIQUE: Multidetector CT imaging of the head, cervical spine, and maxillofacial structures were performed using the standard protocol without intravenous contrast. Multiplanar CT image reconstructions of the cervical spine and maxillofacial structures were also generated. COMPARISON:  05/15/2018 FINDINGS: CT HEAD FINDINGS Brain: See There is atrophy and chronic small vessel disease changes. No acute intracranial abnormality. Specifically, no hemorrhage, hydrocephalus, mass lesion, acute infarction, or significant intracranial injury. Vascular: No hyperdense vessel or unexpected calcification. Skull:  No acute calvarial abnormality. Other: None CT MAXILLOFACIAL FINDINGS Osseous: No fracture or mandibular dislocation. No destructive process. Orbits: Negative. No traumatic or inflammatory finding. Sinuses: Kozel thickening in the maxillary sinuses bilaterally. No air-fluid levels. Soft tissues: Soft tissue swelling/laceration in the left face/orbital region. CT CERVICAL SPINE FINDINGS Alignment: 3 mm of anterolisthesis of C4 on C5 related to facet disease. Skull base and vertebrae: No acute fracture. No primary bone lesion or focal pathologic process. Soft tissues and spinal canal: No prevertebral fluid or swelling.  No visible canal hematoma. Disc levels: Diffuse degenerative facet disease. Degenerative disc disease at C5-6 and C6-7. Is moderate disc herniation noted at C4-5. Upper chest: Biapical scarring.  No acute findings. Other: None IMPRESSION: Atrophy, chronic microvascular disease. No acute intracranial abnormality. Cervical spondylosis. Grade 1 anterolisthesis along with disc herniation noted at C4-5. No fracture in the cervical spine or face. Electronically Signed   By: Charlett Nose M.D.   On: 01/20/2019 19:06   Ct Cervical Spine Wo Contrast  Result Date: 01/20/2019 CLINICAL DATA:  Fall.  Head trauma, headache EXAM: CT HEAD WITHOUT CONTRAST CT MAXILLOFACIAL WITHOUT CONTRAST CT CERVICAL SPINE WITHOUT CONTRAST TECHNIQUE: Multidetector CT imaging of the head, cervical spine, and maxillofacial structures were performed using the standard protocol without intravenous contrast. Multiplanar CT image reconstructions of the cervical spine and maxillofacial structures were also generated. COMPARISON:  05/15/2018 FINDINGS: CT HEAD FINDINGS Brain: See There is atrophy and chronic small vessel disease changes. No acute intracranial abnormality. Specifically, no hemorrhage, hydrocephalus, mass lesion, acute infarction, or significant intracranial injury. Vascular: No hyperdense vessel or unexpected calcification. Skull: No acute calvarial abnormality. Other: None CT MAXILLOFACIAL FINDINGS Osseous: No fracture or mandibular dislocation. No destructive process. Orbits: Negative. No traumatic or inflammatory finding. Sinuses: Kozel thickening in the maxillary sinuses bilaterally. No air-fluid levels. Soft tissues: Soft tissue swelling/laceration in the left face/orbital region. CT CERVICAL SPINE FINDINGS Alignment: 3 mm of anterolisthesis of C4 on C5 related to facet disease. Skull base and vertebrae: No acute fracture. No primary bone lesion or focal pathologic process. Soft tissues and spinal canal: No prevertebral fluid or  swelling. No visible canal hematoma. Disc levels: Diffuse degenerative facet disease. Degenerative disc disease at C5-6 and C6-7. Is moderate disc herniation noted at C4-5. Upper chest: Biapical scarring.  No acute findings. Other: None IMPRESSION: Atrophy, chronic microvascular disease. No acute intracranial abnormality. Cervical spondylosis. Grade 1 anterolisthesis along with disc herniation noted at C4-5. No fracture in the cervical spine or face. Electronically Signed   By: Charlett Nose M.D.   On: 01/20/2019 19:06   Ct Hip Left Wo Contrast  Result Date: 01/20/2019 CLINICAL DATA:  Fall, left hip pain EXAM: CT OF THE LEFT HIP WITHOUT CONTRAST TECHNIQUE: Multidetector CT imaging of the left hip was performed according to the standard protocol. Multiplanar CT image reconstructions were also generated. COMPARISON:  Plain films earlier today FINDINGS: No acute bony abnormality. No fracture. No subluxation or dislocation. Early joint space narrowing and spurring within the left hip joint. There is a large subcutaneous soft tissue hematoma overlying the upper lateral proximal femur measuring up to 7 cm in transverse diameter and approximately 10 cm in craniocaudal length. IMPRESSION: No acute bony abnormality. No proximal femoral fracture. Large soft tissue hematoma overlying the proximal left femur measuring 10 cm in greatest (craniocaudal) length. Electronically Signed   By: Charlett Nose M.D.   On: 01/20/2019 23:56   Dg Femur Min 2 Views Left  Result Date: 01/20/2019 CLINICAL DATA:  Fall, left leg pain EXAM: LEFT FEMUR 2 VIEWS COMPARISON:  06/28/2014 FINDINGS: Prior left knee replacement. Small bone fragments are noted off the superior pole of the left patella on the lateral view, concerning for avulsed fracture fragments. No additional bony abnormality. IMPRESSION: Small bone fragments off the superior pole of the patella concerning for avulsed fracture fragments. Prior left knee replacement. Electronically  Signed   By: Charlett NoseKevin  Dover M.D.   On: 01/20/2019 19:20   Ct Maxillofacial Wo Contrast  Result Date: 01/20/2019 CLINICAL DATA:  Fall.  Head trauma, headache EXAM: CT HEAD WITHOUT CONTRAST CT MAXILLOFACIAL WITHOUT CONTRAST CT CERVICAL SPINE WITHOUT CONTRAST TECHNIQUE: Multidetector CT imaging of the head, cervical spine, and maxillofacial structures were performed using the standard protocol without intravenous contrast. Multiplanar CT image reconstructions of the cervical spine and maxillofacial structures were also generated. COMPARISON:  05/15/2018 FINDINGS: CT HEAD FINDINGS Brain: See There is atrophy and chronic small vessel disease changes. No acute intracranial abnormality. Specifically, no hemorrhage, hydrocephalus, mass lesion, acute infarction, or significant intracranial injury. Vascular: No hyperdense vessel or unexpected calcification. Skull: No acute calvarial abnormality. Other: None CT MAXILLOFACIAL FINDINGS Osseous: No fracture or mandibular dislocation. No destructive process. Orbits: Negative. No traumatic or inflammatory finding. Sinuses: Kozel thickening in the maxillary sinuses bilaterally. No air-fluid levels. Soft tissues: Soft tissue swelling/laceration in the left face/orbital region. CT CERVICAL SPINE FINDINGS Alignment: 3 mm of anterolisthesis of C4 on C5 related to facet disease. Skull base and vertebrae: No acute fracture. No primary bone lesion or focal pathologic process. Soft tissues and spinal canal: No prevertebral fluid or swelling. No visible canal hematoma. Disc levels: Diffuse degenerative facet disease. Degenerative disc disease at C5-6 and C6-7. Is moderate disc herniation noted at C4-5. Upper chest: Biapical scarring.  No acute findings. Other: None IMPRESSION: Atrophy, chronic microvascular disease. No acute intracranial abnormality. Cervical spondylosis. Grade 1 anterolisthesis along with disc herniation noted at C4-5. No fracture in the cervical spine or face.  Electronically Signed   By: Charlett NoseKevin  Dover M.D.   On: 01/20/2019 19:06        Scheduled Meds:  atenolol  25 mg Oral BID   diltiazem  120 mg Oral QHS   docusate sodium  100 mg Oral BID   escitalopram  10 mg Oral Daily   hydroxychloroquine  200 mg Oral Daily   levothyroxine  112 mcg Oral Q0600   metroNIDAZOLE  1 application Topical Daily   midodrine  2.5 mg Oral TID WC   propafenone  150 mg Oral Q24H   propafenone  225 mg Oral BID AC & HS   Tdap  0.5 mL Intramuscular Once   Continuous Infusions:  sodium chloride 100 mL/hr at 01/22/19 0400   ciprofloxacin 400 mg (01/22/19 1333)     LOS: 1 day     Jacquelin Hawkingalph Nicanor Mendolia, MD [facility] 01/22/2019, 2:40 PM  If 7PM-7AM, please contact night-coverage [email or web address]

## 2019-01-22 NOTE — Progress Notes (Signed)
Patient is experiencing urgency and frequency. Used bedpan twice during AM care. Purewick catheter placed. 300 cc present in container. Will continue to monitor.

## 2019-01-22 NOTE — Progress Notes (Signed)
Per verbal order from Dr. Lonny Prude, lying and sitting vital signs taken, respectively at 1745 and 1751. Please see VS flowsheet for data.

## 2019-01-22 NOTE — Progress Notes (Signed)
Pt  Refused to sign consent and refused all medical therapy earlier in the shift.  " Please get out of my house"  Pt yelled at staff.  Called daughter, Vernie Shanks, to obtain Blood transfusion consent. Telephone consent verified with 2 RNs. Daughter reported that pt is confused atimes.  Pt was agreeable to blood transfusion after administration of Ativan 0.5 mg . To continue to monitor pt

## 2019-01-22 NOTE — Progress Notes (Addendum)
11:59 AM: Received call from central telemetry stating patient's ST was elevated at 2.2. Dr. Lonny Prude paged to make aware.   12:40 PM: verbal order by Dr. Lonny Prude to obtain EKG. EKG completed and placed on chart.  Hiram Comber, RN

## 2019-01-22 NOTE — Progress Notes (Signed)
   01/22/19 1231  Urine Characteristics  Urinary Interventions Intermittent/Straight cath  Intermittent/Straight Cath (mL) 325 mL   Will continue to monitor bladder scan throughout shift.

## 2019-01-22 NOTE — Progress Notes (Signed)
Patient's daughter updated about patient's status. Daughter will arrive to hospital when visiting hours begin. Will continue to monitor.  Hiram Comber, RN 01/22/2019 8:11 AM

## 2019-01-22 NOTE — Progress Notes (Signed)
Subjective:  Denies SSCP, palpitations or Dyspnea Some confusion   Objective:  Vitals:   01/21/19 2114 01/22/19 0231 01/22/19 0255 01/22/19 0455  BP: (!) 115/46 112/63 116/60 117/65  Pulse: 69 76 73 72  Resp: 14 (!) 22 20 18   Temp: 98.9 F (37.2 C) 98.6 F (37 C) 99.1 F (37.3 C) 98.8 F (37.1 C)  TempSrc: Oral Oral Oral Oral  SpO2: 93% 91% 94% 95%    Intake/Output from previous day:  Intake/Output Summary (Last 24 hours) at 01/22/2019 0815 Last data filed at 01/22/2019 0429 Gross per 24 hour  Intake 1852.36 ml  Output 600 ml  Net 1252.36 ml    Physical Exam:  Laceration over left forehead Lungs clear No murmur  Large hematoma over left hip softer  No edema Pacer under left clavicle  Lab Results: Basic Metabolic Panel: Recent Labs    01/20/19 1738 01/21/19 0322  NA 130* 129*  K 4.1 4.2  CL 98 97*  CO2 23 25  GLUCOSE 98 99  BUN 12 11  CREATININE 0.85 0.87  CALCIUM 9.0 8.9   Liver Function Tests: No results for input(s): AST, ALT, ALKPHOS, BILITOT, PROT, ALBUMIN in the last 72 hours. No results for input(s): LIPASE, AMYLASE in the last 72 hours. CBC: Recent Labs    01/20/19 1738  01/21/19 1151 01/21/19 1651  WBC 6.3   < > 4.9 4.6  NEUTROABS 4.5  --   --   --   HGB 10.1*   < > 8.1* 7.0*  HCT 30.6*   < > 24.1* 21.0*  MCV 94.7   < > 92.7 92.9  PLT 238   < > 193 190   < > = values in this interval not displayed.     Imaging: Dg Thoracic Spine 2 View  Result Date: 01/20/2019 CLINICAL DATA:  Fall EXAM: THORACIC SPINE 2 VIEWS COMPARISON:  08/01/2014 chest x-ray FINDINGS: Minimal scoliosis. Left-sided cardiac pacing leads. Vertebral body heights are maintained. Degenerative changes of the mid to lower thoracic spine with narrowing and osteophyte IMPRESSION: No acute osseous abnormality Electronically Signed   By: Jasmine PangKim  Fujinaga M.D.   On: 01/20/2019 19:19   Dg Pelvis 1-2 Views  Result Date: 01/20/2019 CLINICAL DATA:  Left hip pain.  Fall. EXAM:  PELVIS - 1-2 VIEW COMPARISON:  None. FINDINGS: There is no evidence of pelvic fracture or diastasis. No pelvic bone lesions are seen. IMPRESSION: Negative. Electronically Signed   By: Charlett NoseKevin  Dover M.D.   On: 01/20/2019 19:18   Dg Tibia/fibula Left  Result Date: 01/20/2019 CLINICAL DATA:  Fall, left leg pain EXAM: LEFT TIBIA AND FIBULA - 2 VIEW COMPARISON:  None. FINDINGS: Prior left knee replacement. Small bone fragments are noted off the superior pole of the left patella concerning for avulsed fragments. No tibia or fibular abnormality. IMPRESSION: Small avulsed fragments off the superior pole of the patella. Prior left knee replacement. Electronically Signed   By: Charlett NoseKevin  Dover M.D.   On: 01/20/2019 19:21   Dg Tibia/fibula Right  Result Date: 01/20/2019 CLINICAL DATA:  Fall EXAM: RIGHT TIBIA AND FIBULA - 2 VIEW COMPARISON:  None. FINDINGS: There is no evidence of fracture or other focal bone lesions. Soft tissues are unremarkable. IMPRESSION: Negative. Electronically Signed   By: Jasmine PangKim  Fujinaga M.D.   On: 01/20/2019 19:18   Dg Ankle Complete Left  Result Date: 01/20/2019 CLINICAL DATA:  Fall EXAM: LEFT ANKLE COMPLETE - 3+ VIEW COMPARISON:  None. FINDINGS: There is no evidence  of fracture, dislocation, or joint effusion. There is no evidence of arthropathy or other focal bone abnormality. Soft tissues are unremarkable. IMPRESSION: Negative. Electronically Signed   By: Rolm Baptise M.D.   On: 01/20/2019 19:21   Ct Head Wo Contrast  Result Date: 01/20/2019 CLINICAL DATA:  Fall.  Head trauma, headache EXAM: CT HEAD WITHOUT CONTRAST CT MAXILLOFACIAL WITHOUT CONTRAST CT CERVICAL SPINE WITHOUT CONTRAST TECHNIQUE: Multidetector CT imaging of the head, cervical spine, and maxillofacial structures were performed using the standard protocol without intravenous contrast. Multiplanar CT image reconstructions of the cervical spine and maxillofacial structures were also generated. COMPARISON:  05/15/2018 FINDINGS: CT  HEAD FINDINGS Brain: See There is atrophy and chronic small vessel disease changes. No acute intracranial abnormality. Specifically, no hemorrhage, hydrocephalus, mass lesion, acute infarction, or significant intracranial injury. Vascular: No hyperdense vessel or unexpected calcification. Skull: No acute calvarial abnormality. Other: None CT MAXILLOFACIAL FINDINGS Osseous: No fracture or mandibular dislocation. No destructive process. Orbits: Negative. No traumatic or inflammatory finding. Sinuses: Kozel thickening in the maxillary sinuses bilaterally. No air-fluid levels. Soft tissues: Soft tissue swelling/laceration in the left face/orbital region. CT CERVICAL SPINE FINDINGS Alignment: 3 mm of anterolisthesis of C4 on C5 related to facet disease. Skull base and vertebrae: No acute fracture. No primary bone lesion or focal pathologic process. Soft tissues and spinal canal: No prevertebral fluid or swelling. No visible canal hematoma. Disc levels: Diffuse degenerative facet disease. Degenerative disc disease at C5-6 and C6-7. Is moderate disc herniation noted at C4-5. Upper chest: Biapical scarring.  No acute findings. Other: None IMPRESSION: Atrophy, chronic microvascular disease. No acute intracranial abnormality. Cervical spondylosis. Grade 1 anterolisthesis along with disc herniation noted at C4-5. No fracture in the cervical spine or face. Electronically Signed   By: Rolm Baptise M.D.   On: 01/20/2019 19:06   Ct Cervical Spine Wo Contrast  Result Date: 01/20/2019 CLINICAL DATA:  Fall.  Head trauma, headache EXAM: CT HEAD WITHOUT CONTRAST CT MAXILLOFACIAL WITHOUT CONTRAST CT CERVICAL SPINE WITHOUT CONTRAST TECHNIQUE: Multidetector CT imaging of the head, cervical spine, and maxillofacial structures were performed using the standard protocol without intravenous contrast. Multiplanar CT image reconstructions of the cervical spine and maxillofacial structures were also generated. COMPARISON:  05/15/2018  FINDINGS: CT HEAD FINDINGS Brain: See There is atrophy and chronic small vessel disease changes. No acute intracranial abnormality. Specifically, no hemorrhage, hydrocephalus, mass lesion, acute infarction, or significant intracranial injury. Vascular: No hyperdense vessel or unexpected calcification. Skull: No acute calvarial abnormality. Other: None CT MAXILLOFACIAL FINDINGS Osseous: No fracture or mandibular dislocation. No destructive process. Orbits: Negative. No traumatic or inflammatory finding. Sinuses: Kozel thickening in the maxillary sinuses bilaterally. No air-fluid levels. Soft tissues: Soft tissue swelling/laceration in the left face/orbital region. CT CERVICAL SPINE FINDINGS Alignment: 3 mm of anterolisthesis of C4 on C5 related to facet disease. Skull base and vertebrae: No acute fracture. No primary bone lesion or focal pathologic process. Soft tissues and spinal canal: No prevertebral fluid or swelling. No visible canal hematoma. Disc levels: Diffuse degenerative facet disease. Degenerative disc disease at C5-6 and C6-7. Is moderate disc herniation noted at C4-5. Upper chest: Biapical scarring.  No acute findings. Other: None IMPRESSION: Atrophy, chronic microvascular disease. No acute intracranial abnormality. Cervical spondylosis. Grade 1 anterolisthesis along with disc herniation noted at C4-5. No fracture in the cervical spine or face. Electronically Signed   By: Rolm Baptise M.D.   On: 01/20/2019 19:06   Ct Hip Left Wo Contrast  Result Date: 01/20/2019 CLINICAL  DATA:  Fall, left hip pain EXAM: CT OF THE LEFT HIP WITHOUT CONTRAST TECHNIQUE: Multidetector CT imaging of the left hip was performed according to the standard protocol. Multiplanar CT image reconstructions were also generated. COMPARISON:  Plain films earlier today FINDINGS: No acute bony abnormality. No fracture. No subluxation or dislocation. Early joint space narrowing and spurring within the left hip joint. There is a large  subcutaneous soft tissue hematoma overlying the upper lateral proximal femur measuring up to 7 cm in transverse diameter and approximately 10 cm in craniocaudal length. IMPRESSION: No acute bony abnormality. No proximal femoral fracture. Large soft tissue hematoma overlying the proximal left femur measuring 10 cm in greatest (craniocaudal) length. Electronically Signed   By: Charlett Nose M.D.   On: 01/20/2019 23:56   Dg Femur Min 2 Views Left  Result Date: 01/20/2019 CLINICAL DATA:  Fall, left leg pain EXAM: LEFT FEMUR 2 VIEWS COMPARISON:  06/28/2014 FINDINGS: Prior left knee replacement. Small bone fragments are noted off the superior pole of the left patella on the lateral view, concerning for avulsed fracture fragments. No additional bony abnormality. IMPRESSION: Small bone fragments off the superior pole of the patella concerning for avulsed fracture fragments. Prior left knee replacement. Electronically Signed   By: Charlett Nose M.D.   On: 01/20/2019 19:20   Ct Maxillofacial Wo Contrast  Result Date: 01/20/2019 CLINICAL DATA:  Fall.  Head trauma, headache EXAM: CT HEAD WITHOUT CONTRAST CT MAXILLOFACIAL WITHOUT CONTRAST CT CERVICAL SPINE WITHOUT CONTRAST TECHNIQUE: Multidetector CT imaging of the head, cervical spine, and maxillofacial structures were performed using the standard protocol without intravenous contrast. Multiplanar CT image reconstructions of the cervical spine and maxillofacial structures were also generated. COMPARISON:  05/15/2018 FINDINGS: CT HEAD FINDINGS Brain: See There is atrophy and chronic small vessel disease changes. No acute intracranial abnormality. Specifically, no hemorrhage, hydrocephalus, mass lesion, acute infarction, or significant intracranial injury. Vascular: No hyperdense vessel or unexpected calcification. Skull: No acute calvarial abnormality. Other: None CT MAXILLOFACIAL FINDINGS Osseous: No fracture or mandibular dislocation. No destructive process. Orbits:  Negative. No traumatic or inflammatory finding. Sinuses: Kozel thickening in the maxillary sinuses bilaterally. No air-fluid levels. Soft tissues: Soft tissue swelling/laceration in the left face/orbital region. CT CERVICAL SPINE FINDINGS Alignment: 3 mm of anterolisthesis of C4 on C5 related to facet disease. Skull base and vertebrae: No acute fracture. No primary bone lesion or focal pathologic process. Soft tissues and spinal canal: No prevertebral fluid or swelling. No visible canal hematoma. Disc levels: Diffuse degenerative facet disease. Degenerative disc disease at C5-6 and C6-7. Is moderate disc herniation noted at C4-5. Upper chest: Biapical scarring.  No acute findings. Other: None IMPRESSION: Atrophy, chronic microvascular disease. No acute intracranial abnormality. Cervical spondylosis. Grade 1 anterolisthesis along with disc herniation noted at C4-5. No fracture in the cervical spine or face. Electronically Signed   By: Charlett Nose M.D.   On: 01/20/2019 19:06    Cardiac Studies:  ECG: SR 61 PAC    Telemetry:  NSR   Echo: 2018  EF 55-60%   Medications:    atenolol  25 mg Oral BID   diltiazem  120 mg Oral QHS   docusate sodium  100 mg Oral BID   escitalopram  10 mg Oral Daily   hydroxychloroquine  200 mg Oral Daily   levothyroxine  112 mcg Oral Q0600   metroNIDAZOLE  1 application Topical Daily   midodrine  2.5 mg Oral TID WC   propafenone  150 mg Oral  Q24H   propafenone  225 mg Oral BID AC & HS   Tdap  0.5 mL Intramuscular Once      sodium chloride 100 mL/hr at 01/22/19 0400   ciprofloxacin 400 mg (01/22/19 0537)    Assessment/Plan:   PAF:  Has been in sinus quite a while On propafenone Low risk of embolic event off eliquis Would stay off for at least 2 weeks   PPM:  Normal function f/u GT  Thyroid:  TSH normal continue replacement   Hematoma:  Hb down to 7 would transfuse at least a unit   [employer] 01/22/2019, 8:15 AM

## 2019-01-22 NOTE — Progress Notes (Signed)
PT Cancellation Note  Patient Details Name: ADRIANN THAU MRN: [id number] DOB: 1946/01/20   Cancelled Treatment:    Reason Eval/Treat Not Completed: Medical issues which prohibited therapy(daughter declined PT due to new UTI and increased confusion today)   Shanna Cisco 01/22/2019, 10:58 AM  01/22/2019 [place], PT Acute Rehabilitation Services Pager:  [phone] Office:  [phone]

## 2019-01-23 ENCOUNTER — Inpatient Hospital Stay (HOSPITAL_COMMUNITY): Payer: Medicare Other

## 2019-01-23 DIAGNOSIS — R1084 Generalized abdominal pain: Secondary | ICD-10-CM

## 2019-01-23 LAB — CBC
HCT: 23.8 % — ABNORMAL LOW (ref 36.0–46.0)
HCT: 27.1 % — ABNORMAL LOW (ref 36.0–46.0)
Hemoglobin: 7.8 g/dL — ABNORMAL LOW (ref 12.0–15.0)
Hemoglobin: 9.1 g/dL — ABNORMAL LOW (ref 12.0–15.0)
MCH: 30.5 pg (ref 26.0–34.0)
MCH: 30.6 pg (ref 26.0–34.0)
MCHC: 32.8 g/dL (ref 30.0–36.0)
MCHC: 33.6 g/dL (ref 30.0–36.0)
MCV: 93 fL (ref 80.0–100.0)
MCV: 91.2 fL (ref 80.0–100.0)
Platelets: 175 10*3/uL (ref 150–400)
Platelets: 226 10*3/uL (ref 150–400)
RBC: 2.56 MIL/uL — ABNORMAL LOW (ref 3.87–5.11)
RBC: 2.97 MIL/uL — ABNORMAL LOW (ref 3.87–5.11)
RDW: 13.9 % (ref 11.5–15.5)
RDW: 13.7 % (ref 11.5–15.5)
WBC: 4.6 10*3/uL (ref 4.0–10.5)
WBC: 6.1 10*3/uL (ref 4.0–10.5)
nRBC: 0 % (ref 0.0–0.2)
nRBC: 0 % (ref 0.0–0.2)

## 2019-01-23 LAB — URINE CULTURE: Culture: >=100000 — AB

## 2019-01-23 MED ORDER — CIPROFLOXACIN HCL 500 MG PO TABS
250.0000 mg | ORAL_TABLET | Freq: Two times a day (BID) | ORAL | Status: AC
  Administered 2019-01-23 – 2019-01-24 (×4): 250 mg via ORAL
  Filled 2019-01-23 (×4): qty 1

## 2019-01-23 MED ORDER — HYDROCODONE-ACETAMINOPHEN 5-325 MG PO TABS
1.0000 | ORAL_TABLET | Freq: Four times a day (QID) | ORAL | Status: DC | PRN
  Administered 2019-01-23 – 2019-01-25 (×5): 2 via ORAL
  Administered 2019-01-26: 1 via ORAL
  Filled 2019-01-23 (×2): qty 2
  Filled 2019-01-23: qty 1
  Filled 2019-01-23 (×3): qty 2

## 2019-01-23 NOTE — Progress Notes (Addendum)
   01/23/19 1315  Urine Characteristics  Post Void Cath Residual (mL) 126 mL    Dr. Lonny Prude notified of bladder scan result.

## 2019-01-23 NOTE — Progress Notes (Signed)
Physical Therapy Treatment Patient Details Name: Jacqueline Kim MRN: [id number] DOB: 1945/09/07 Today's Date: 01/23/2019    History of Present Illness Pt is a 73 y.o. female admitted 01/20/19 after fall sustaining large L hip hematoma and facial laceration. Other imaging clear. PMH includes HTN, afib, pacemaker, glaucoma, DJD, depression.    PT Comments    Patient seen for mobility progression. Pt requires mod/max A for sit to stand from EOB and min A for short distance gait to recliner. Gait training limited by c/o dizziness and pt with orthostatic BP however BPs not as soft as previous PT session 8/7 (see general comments below). Continue to progress as tolerated with anticipated d/c to SNF for further skilled PT services.      Follow Up Recommendations  Supervision/Assistance - 24 hour;SNF     Equipment Recommendations  None recommended by PT    Recommendations for Other Services       Precautions / Restrictions Precautions Precautions: Fall Precaution Comments: Orthostatic hypotension; L hip hematoma Restrictions Weight Bearing Restrictions: No    Mobility  Bed Mobility Overal bed mobility: Needs Assistance Bed Mobility: Supine to Sit     Supine to sit: Supervision     General bed mobility comments: increased time and effort and use of rail  Transfers Overall transfer level: Needs assistance Equipment used: Rolling walker (2 wheeled) Transfers: Sit to/from Stand Sit to Stand: Max assist;Mod assist         General transfer comment: cues for safe hand placement; assistance to power up into standing and then to gain balance upon standing given LOB initially  Ambulation/Gait Ambulation/Gait assistance: Min assist   Assistive device: Rolling walker (2 wheeled) Gait Pattern/deviations: Leaning posteriorly;Trunk flexed;Shuffle Gait velocity: Decreased   General Gait Details: pt able to ambulate short distance EOB to recliner; limited by c/o dizziness; assist to  steady   Stairs             Wheelchair Mobility    Modified Rankin (Stroke Patients Only)       Balance Overall balance assessment: Needs assistance   Sitting balance-Leahy Scale: Fair       Standing balance-Leahy Scale: Poor Standing balance comment: Reliant on UE support and external assist                            Cognition Arousal/Alertness: Awake/alert Behavior During Therapy: Flat affect Overall Cognitive Status: History of cognitive impairments - at baseline Area of Impairment: Attention;Memory;Following commands;Safety/judgement;Awareness;Problem solving                   Current Attention Level: Sustained Memory: Decreased short-term memory Following Commands: Follows one step commands with increased time Safety/Judgement: Decreased awareness of safety;Decreased awareness of deficits Awareness: Emergent Problem Solving: Requires verbal cues        Exercises      General Comments General comments (skin integrity, edema, etc.): Supine BP 133/70, sitting BP 122/80, standing BP 108/68      Pertinent Vitals/Pain Pain Assessment: Faces Faces Pain Scale: Hurts little more Pain Location: L hip Pain Descriptors / Indicators: Discomfort;Guarding Pain Intervention(s): Limited activity within patient's tolerance;Monitored during session;Repositioned    Home Living                      Prior Function            PT Goals (current goals can now be found in the care plan section) Acute  Rehab PT Goals Patient Stated Goal: Stay living at home alone as long as possible Progress towards PT goals: Progressing toward goals    Frequency    Min 3X/week      PT Plan Current plan remains appropriate    Co-evaluation              AM-PAC PT "6 Clicks" Mobility   Outcome Measure  Help needed turning from your back to your side while in a flat bed without using bedrails?: A Little Help needed moving from lying on your  back to sitting on the side of a flat bed without using bedrails?: A Little Help needed moving to and from a bed to a chair (including a wheelchair)?: A Little Help needed standing up from a chair using your arms (e.g., wheelchair or bedside chair)?: A Lot Help needed to walk in hospital room?: A Little Help needed climbing 3-5 steps with a railing? : A Lot 6 Click Score: 16    End of Session Equipment Utilized During Treatment: Gait belt Activity Tolerance: Treatment limited secondary to medical complications (Comment)(symptomatic orthostatic hypotension ) Patient left: with call bell/phone within reach;in chair;with chair alarm set Nurse Communication: Mobility status PT Visit Diagnosis: Other abnormalities of gait and mobility (R26.89);Muscle weakness (generalized) (M62.81)     Time: 2409-7353 PT Time Calculation (min) (ACUTE ONLY): 27 min  Charges:  $Gait Training: 8-22 mins $Therapeutic Activity: 8-22 mins                     Earney Navy, PTA Acute Rehabilitation Services Pager: [phone] Office: [phone]     Darliss Cheney 01/23/2019, 4:38 PM

## 2019-01-23 NOTE — Progress Notes (Signed)
PROGRESS NOTE    Jacqueline Kim  WIO:[id number] DOB: Jan 13, 1946 DOA: 01/20/2019 PCP: Georgianne Fick, MD   Brief Narrative: Jacqueline Kim is a 73 y.o. 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 Patient presented secondary to a fall suffering a thigh hematoma and left periorbital laceration.   Assessment & Plan:   Principal Problem:   Hematoma of left thigh Active Problems:   Hypothyroid   Pacemaker Medtronic  (REVO) placed10/23/12   Paroxysmal atrial fibrillation (HCC)   Normocytic normochromic anemia   Hematoma   Left thigh hematoma Secondary to trauma. Significant blood loss but hematoma appears stable. Worse in setting of Eliquis use. Hematoma appears stable. Received only 1 of 2 units of PRBC secondary to hospital protocol. Hemoglobin trended down slightly to 7.8 today -Continue observation -Repeat CBC this afternoon. If stable at under 8, will give second unit previously ordered.  Fall Resultant left periorbital laceration and hematoma.  Paroxysmal atrial fibrillation Tachybrady syndrome S/p pacemaker -Continue diltiazem, atenolol, propafenone -Hold Eliquis for 2 weeks  Acute blood loss anemia Chronic anemia Secondary to hematoma. As mentioned above.  Hypothyroidism -Continue Synthroid  UTI Started on ceftriaxone. Urine culture significant for E. Coli. Associated urinary retention -Continue Ciprofloxacin x3 day treatment course  Acute urinary retention Patient is s/p in out catheterization on 8/8. Appears resolved. Likely secondary to above  Rheumatoid arthritis -Continue Plaquenil  Abnormal telemetry Received call about abnormal telemetry. EKG obtained without evidence of ST changes. No chest pain.  Intermittent confusion No official diagnosis but occurs at home as well per report. Appears lucid today.   DVT prophylaxis:  SCDs Code Status:   Code Status: Full Code Family Communication: Son at bedside Disposition Plan: Discharge to SNF when medically stable with stable hemoglobin   Consultants:   Cardiology  General surgery/Trauma  Procedures:   None  Antimicrobials:  Ciprofloxacin   Subjective: No issues overnight  Objective: Vitals:   01/22/19 1745 01/22/19 1751 01/22/19 2110 01/23/19 0621  BP: (!) 150/80 (!) 159/53 (!) 148/71 140/71  Pulse: 68 66 78 63  Resp: 19 17 18 18   Temp: 98.5 F (36.9 C) 98.9 F (37.2 C) 98.3 F (36.8 C) 98.3 F (36.8 C)  TempSrc: Oral Oral Oral   SpO2: 97% 100% 96% 96%    Intake/Output Summary (Last 24 hours) at 01/23/2019 1359 Last data filed at 01/23/2019 1315 Gross per 24 hour  Intake 480 ml  Output 1276 ml  Net -796 ml   There were no vitals filed for this visit.  Examination:  General exam: Appears calm and comfortable Respiratory system: Clear to auscultation. Respiratory effort normal. Cardiovascular system: S1 & S2 heard, RRR. No murmurs, rubs, gallops or clicks. Gastrointestinal system: Abdomen is nondistended, soft and nontender. No organomegaly or masses felt. Normal bowel sounds heard. Central nervous system: Alert and oriented. No focal neurological deficits. Extremities: No edema. No calf tenderness Skin: No cyanosis. No rashes Psychiatry: Judgement and insight appear normal. Mood & affect appropriate.     Data Reviewed: I have personally reviewed following labs and imaging studies  CBC: Recent Labs  Lab 01/20/19 1738  01/21/19 1151 01/21/19 1651 01/22/19 0800 01/22/19 1520 01/23/19 0450  WBC 6.3   < > 4.9 4.6 4.8 6.0 4.6  NEUTROABS 4.5  --   --   --   --   --   --   HGB 10.1*   < > 8.1* 7.0* 8.1*  8.3* 7.8*  HCT 30.6*   < > 24.1* 21.0* 24.8* 26.4* 23.8*  MCV 94.7   < > 92.7 92.9 92.5 97.1 93.0  PLT 238   < > 193 190 183 173 175   < > = values in this interval not displayed.   Basic Metabolic Panel: Recent Labs  Lab  01/20/19 1738 01/21/19 0322 01/22/19 0800  NA 130* 129* 129*  K 4.1 4.2 4.5  CL 98 97* 99  CO2 23 25 23   GLUCOSE 98 99 87  BUN 12 11 8   CREATININE 0.85 0.87 0.78  CALCIUM 9.0 8.9 8.4*   GFR: CrCl cannot be calculated (Unknown ideal weight.). Liver Function Tests: No results for input(s): AST, ALT, ALKPHOS, BILITOT, PROT, ALBUMIN in the last 168 hours. No results for input(s): LIPASE, AMYLASE in the last 168 hours. No results for input(s): AMMONIA in the last 168 hours. Coagulation Profile: Recent Labs  Lab 01/20/19 1738  INR 1.4*   Cardiac Enzymes: No results for input(s): CKTOTAL, CKMB, CKMBINDEX, TROPONINI in the last 168 hours. BNP (last 3 results) No results for input(s): PROBNP in the last 8760 hours. HbA1C: No results for input(s): HGBA1C in the last 72 hours. CBG: No results for input(s): GLUCAP in the last 168 hours. Lipid Profile: No results for input(s): CHOL, HDL, LDLCALC, TRIG, CHOLHDL, LDLDIRECT in the last 72 hours. Thyroid Function Tests: No results for input(s): TSH, T4TOTAL, FREET4, T3FREE, THYROIDAB in the last 72 hours. Anemia Panel: No results for input(s): VITAMINB12, FOLATE, FERRITIN, TIBC, IRON, RETICCTPCT in the last 72 hours. Sepsis Labs: No results for input(s): PROCALCITON, LATICACIDVEN in the last 168 hours.  Recent Results (from the past 240 hour(s))  Urine Culture     Status: Abnormal   Collection Time: 01/20/19  9:44 PM   Specimen: Urine, Random  Result Value Ref Range Status   Specimen Description URINE, RANDOM  Final   Special Requests   Final    NONE Performed at [facility], [address] [place], [place] [address]    Culture >=100,000 COLONIES/mL ESCHERICHIA COLI (A)  Final   Report Status 01/23/2019 FINAL  Final   Organism ID, Bacteria ESCHERICHIA COLI (A)  Final      Susceptibility   Escherichia coli - MIC*    AMPICILLIN 8 SENSITIVE Sensitive     CEFAZOLIN <=4 SENSITIVE Sensitive     CEFTRIAXONE <=1 SENSITIVE  Sensitive     CIPROFLOXACIN <=0.25 SENSITIVE Sensitive     GENTAMICIN <=1 SENSITIVE Sensitive     IMIPENEM <=0.25 SENSITIVE Sensitive     NITROFURANTOIN <=16 SENSITIVE Sensitive     TRIMETH/SULFA >=320 RESISTANT Resistant     AMPICILLIN/SULBACTAM 4 SENSITIVE Sensitive     PIP/TAZO <=4 SENSITIVE Sensitive     Extended ESBL NEGATIVE Sensitive     * >=100,000 COLONIES/mL ESCHERICHIA COLI  SARS Coronavirus 2 [facility] order, Performed in [facility] hospital lab) Nasopharyngeal Nasopharyngeal Swab     Status: None   Collection Time: 01/20/19 11:55 PM   Specimen: Nasopharyngeal Swab  Result Value Ref Range Status   SARS Coronavirus 2 NEGATIVE NEGATIVE Final    Comment: (NOTE) If result is NEGATIVE SARS-CoV-2 target nucleic acids are NOT DETECTED. The SARS-CoV-2 RNA is generally detectable in upper and lower  respiratory specimens during the acute phase of infection. The lowest  concentration of SARS-CoV-2 viral copies this assay can detect is 250  copies / mL. A negative result does not preclude SARS-CoV-2 infection  and should not  be used as the sole basis for treatment or other  patient management decisions.  A negative result may occur with  improper specimen collection / handling, submission of specimen other  than nasopharyngeal swab, presence of viral mutation(s) within the  areas targeted by this assay, and inadequate number of viral copies  (<250 copies / mL). A negative result must be combined with clinical  observations, patient history, and epidemiological information. If result is POSITIVE SARS-CoV-2 target nucleic acids are DETECTED. The SARS-CoV-2 RNA is generally detectable in upper and lower  respiratory specimens dur ing the acute phase of infection.  Positive  results are indicative of active infection with SARS-CoV-2.  Clinical  correlation with patient history and other diagnostic information is  necessary to determine patient infection status.  Positive results do   not rule out bacterial infection or co-infection with other viruses. If result is PRESUMPTIVE POSTIVE SARS-CoV-2 nucleic acids MAY BE PRESENT.   A presumptive positive result was obtained on the submitted specimen  and confirmed on repeat testing.  While 2019 novel coronavirus  (SARS-CoV-2) nucleic acids may be present in the submitted sample  additional confirmatory testing may be necessary for epidemiological  and / or clinical management purposes  to differentiate between  SARS-CoV-2 and other Sarbecovirus currently known to infect humans.  If clinically indicated additional testing with an alternate test  methodology [phone]) is advised. The SARS-CoV-2 RNA is generally  detectable in upper and lower respiratory sp ecimens during the acute  phase of infection. The expected result is Negative. Fact Sheet for Patients:  BoilerBrush.com.cy Fact Sheet for Healthcare Providers: https://pope.com/ This test is not yet approved or cleared by the [place] FDA and has been authorized for detection and/or diagnosis of SARS-CoV-2 by FDA under an Emergency Use Authorization (EUA).  This EUA will remain in effect (meaning this test can be used) for the duration of the COVID-19 declaration under Section 564(b)(1) of the Act, 21 U.S.C. section 360bbb-3(b)(1), unless the authorization is terminated or revoked sooner. Performed at [facility], [address] [place], [place] [address]          Radiology Studies: No results found.      Scheduled Meds: . atenolol  25 mg Oral BID  . diltiazem  120 mg Oral QHS  . docusate sodium  100 mg Oral BID  . escitalopram  10 mg Oral Daily  . hydroxychloroquine  200 mg Oral Daily  . levothyroxine  112 mcg Oral Q0600  . metroNIDAZOLE  1 application Topical Daily  . midodrine  2.5 mg Oral TID WC  . propafenone  150 mg Oral Q24H  . propafenone  225 mg Oral BID AC & HS  . Tdap  0.5 mL  Intramuscular Once   Continuous Infusions: . ciprofloxacin 400 mg (01/23/19 0200)     LOS: 2 days     Jacquelin Hawking, MD [facility] 01/23/2019, 1:59 PM  If 7PM-7AM, please contact night-coverage [email or web address]

## 2019-01-23 NOTE — Progress Notes (Signed)
Pharmacy Antibiotic Note  Jacqueline Kim is a 73 y.o. female admitted on 01/20/2019 for fall with large left hip hematoma. She was later found to have a UTI.  Pharmacy has been consulted for cipro dosing. WBC 4.6, afebrile. Renal function stable. Patient is currently taking QTc prolonging agents with Cipro, 8/8 QTc 475.   8/6 covid 19: negative 8/6 UCx: E.coli, Resistant to Bactrim only  8/7 Cipro >>  Plan: - Switch IV Cipro to oral Cipro 250mg  twice daily - Monitor renal function, QTc, clinical status    Temp (24hrs), Avg:98.5 F (36.9 C), Min:98.3 F (36.8 C), Max:98.9 F (37.2 C)  Recent Labs  Lab 01/20/19 1738 01/21/19 0322 01/21/19 1151 01/21/19 1651 01/22/19 0800 01/22/19 1520 01/23/19 0450  WBC 6.3 4.8 4.9 4.6 4.8 6.0 4.6  CREATININE 0.85 0.87  --   --  0.78  --   --     CrCl cannot be calculated (Unknown ideal weight.).    Allergies  Allergen Reactions  . Azithromycin Other (See Comments)    Causes heart rhythm issues Other reaction(s): Other (See Comments) Causes heart rhythm issues  . Epinephrine Other (See Comments)    Causes A-Fib Other reaction(s): Increased Heart Rate (intolerance) States that it caused her heart to beat fast, so she will not take it. Causes A-Fib A-fib Triggers atrial fib   . Penicillins Anaphylaxis    Has patient had a PCN reaction causing immediate rash, facial/tongue/throat swelling, SOB or lightheadedness with hypotension: Yes Has patient had a PCN reaction causing severe rash involving mucus membranes or skin necrosis: No Has patient had a PCN reaction that required hospitalization No Has patient had a PCN reaction occurring within the last 10 years: No If all of the above answers are "NO", then may proceed with Cephalosporin use.   . Lidocaine Palpitations    Or any similar medication Other reaction(s): Other (See Comments) Or any similar medication  . Metoprolol Other (See Comments)    alopecia Other reaction(s): Other  (See Comments) alopecia    Thank you for allowing pharmacy to be a part of this patient's care.  Agnes Lawrence, PharmD PGY1 Pharmacy Resident

## 2019-01-23 NOTE — Progress Notes (Signed)
    Subjective:   No cardiac complaints  Objective:  Vitals:   01/22/19 1745 01/22/19 1751 01/22/19 2110 01/23/19 0621  BP: (!) 150/80 (!) 159/53 (!) 148/71 140/71  Pulse: 68 66 78 63  Resp: 19 17 18 18   Temp: 98.5 F (36.9 C) 98.9 F (37.2 C) 98.3 F (36.8 C) 98.3 F (36.8 C)  TempSrc: Oral Oral Oral   SpO2: 97% 100% 96% 96%    Intake/Output from previous day:  Intake/Output Summary (Last 24 hours) at 01/23/2019 0804 Last data filed at 01/22/2019 2024 Gross per 24 hour  Intake 1128.43 ml  Output 2125 ml  Net -996.57 ml    Physical Exam:  Laceration over left forehead Lungs clear No murmur  Large hematoma over left hip softer  No edema Pacer under left clavicle  Lab Results: Basic Metabolic Panel: Recent Labs    01/21/19 0322 01/22/19 0800  NA 129* 129*  K 4.2 4.5  CL 97* 99  CO2 25 23  GLUCOSE 99 87  BUN 11 8  CREATININE 0.87 0.78  CALCIUM 8.9 8.4*   Liver Function Tests: No results for input(s): AST, ALT, ALKPHOS, BILITOT, PROT, ALBUMIN in the last 72 hours. No results for input(s): LIPASE, AMYLASE in the last 72 hours. CBC: Recent Labs    01/20/19 1738  01/22/19 1520 01/23/19 0450  WBC 6.3   < > 6.0 4.6  NEUTROABS 4.5  --   --   --   HGB 10.1*   < > 8.3* 7.8*  HCT 30.6*   < > 26.4* 23.8*  MCV 94.7   < > 97.1 93.0  PLT 238   < > 173 175   < > = values in this interval not displayed.     Imaging: No results found.  Cardiac Studies:  ECG: SR 61 PAC    Telemetry:  NSR   Echo: 2018  EF 55-60%   Medications:   . atenolol  25 mg Oral BID  . diltiazem  120 mg Oral QHS  . docusate sodium  100 mg Oral BID  . escitalopram  10 mg Oral Daily  . hydroxychloroquine  200 mg Oral Daily  . levothyroxine  112 mcg Oral Q0600  . metroNIDAZOLE  1 application Topical Daily  . midodrine  2.5 mg Oral TID WC  . propafenone  150 mg Oral Q24H  . propafenone  225 mg Oral BID AC & HS  . Tdap  0.5 mL Intramuscular Once     . sodium chloride 100 mL/hr  at 01/22/19 0400  . ciprofloxacin 400 mg (01/23/19 0200)    Assessment/Plan:   PAF:  Has been in sinus quite a while On propafenone Low risk of embolic event off eliquis Would stay off for at least 2 weeks will arrange f/u with Dr Claiborne Billings  PPM:  Normal function f/u GT  Thyroid:  TSH normal continue replacement   Hematoma:  Post 2 units transfusion Hb still low 7.8 this am no increase in size and softening  Will sign off and arrange f/u with Dr Asa Lente [facility] 01/23/2019, 8:04 AM

## 2019-01-23 NOTE — Progress Notes (Signed)
Called regarding patient having worsening abdominal pain. Patient describes pain as diffuse and radiating to back. She states this is similar to episodes as an outpatient and possibly related to her bowels. Last bowel movement last night. Passing gas intermittently.  Abdomen: soft with some fullness. Mild diffuse tenderness. Bowel sounds normal.  A/P: Will obtain abdominal x-ray and bladder scan. If both negative, will obtain CT abdomen/pelvis. Awaiting repeat CBC to ensure stable hemoglobin.  Cordelia Poche, MD [facility] 01/23/2019, 6:04 PM

## 2019-01-23 NOTE — Progress Notes (Signed)
Patient reports worsening dull, cramping abdominal pain that is radiating to back. Pain scale: 6/10. Abdomen is tender to touch. Dr. Lonny Prude notified.

## 2019-01-23 NOTE — TOC Initial Note (Signed)
Transition of Care [facility]) - Initial/Assessment Note    Patient Details  Name: Jacqueline Kim MRN: [id number] Date of Birth: 06-22-1945  Transition of Care [facility]) CM/SW Contact:    Mearl Latin, LCSW Phone Number: 01/23/2019, 10:22 AM  Clinical Narrative:                 CSW received consult for possible SNF placement at time of discharge. CSW spoke with patient's daughter, [name], regarding PT recommendation of SNF placement at time of discharge. She reports she and her sister are decision makers and that they want patient to get stronger before returning home. Patient's daughter expressed understanding of PT recommendation and is agreeable to SNF placement at time of discharge. CSW discussed insurance authorization process and provided Medicare SNF ratings list by email. CSW also answered questions regarding post-SNF stay. Patient's daughter expressed being hopeful for rehab and to feel better soon. No further questions reported at this time. CSW to continue to follow and assist with discharge planning needs.   Expected Discharge Plan: Skilled Nursing Facility Barriers to Discharge: Continued Medical Work up   Patient Goals and CMS Choice Patient states their goals for this hospitalization and ongoing recovery are:: Get stronger CMS Medicare.gov Compare Post Acute Care list provided to:: Patient Represenative (must comment)(Daughter, [name]) Choice offered to / list presented to : [facility] POA / Guardian  Expected Discharge Plan and Services Expected Discharge Plan: Skilled Nursing Facility In-house Referral: Clinical Social Work Discharge Planning Services: NA Post Acute Care Choice: Skilled Nursing Facility Living arrangements for the past 2 months: Apartment                 DME Arranged: N/A DME Agency: NA       HH Arranged: NA HH Agency: NA        Prior Living Arrangements/Services Living arrangements for the past 2 months: Apartment Lives with:: Self Patient language and need  for interpreter reviewed:: Yes Do you feel safe going back to the place where you live?: No   Lives alone  Need for Family Participation in Patient Care: Yes (Comment) Care giver support system in place?: Yes (comment) Current home services: DME Criminal Activity/Legal Involvement Pertinent to Current Situation/Hospitalization: No - Comment as needed  Activities of Daily Living      Permission Sought/Granted Permission sought to share information with : Facility Medical sales representative, Family Supports Permission granted to share information with : No  Share Information with NAME: [name]  Permission granted to share info w AGENCY: SNFs  Permission granted to share info w Relationship: Daughter, joint HCPOA  Permission granted to share info w Contact Information: [phone]  Emotional Assessment Appearance:: Appears stated age Attitude/Demeanor/Rapport: Unable to Assess Affect (typically observed): Unable to Assess Orientation: : Oriented to Self Alcohol / Substance Use: Not Applicable Psych Involvement: No (comment)  Admission diagnosis:  Hematoma [T14.8XXA] Facial laceration, initial encounter [S01.81XA] Fall, initial encounter [W19.XXXA] Pain of left hip joint [M25.552] Patient Active Problem List   Diagnosis Date Noted  . Hematoma of left thigh 01/21/2019  . Normocytic normochromic anemia 01/21/2019  . Hematoma 01/21/2019  . Facial laceration   . Rectal bleeding 02/01/2016  . Constipation 02/01/2016  . Dehydration 05/05/2015  . S/P left TKA 05/01/2015  . S/P knee replacement 05/01/2015  . Hypotension due to drugs 02/20/2015  . Cellulitis of left buttock 12/20/2014  . Cellulitis 12/20/2014  . AKI (acute kidney injury) (HCC) 12/20/2014  . Hyponatremia 12/20/2014  . Paroxysmal atrial fibrillation (HCC)  08/01/2014  . Irritable bowel syndrome 06/14/2014  . Fatigue 03/06/2014  . HTN (hypertension) 09/28/2013  . Atrial fibrillation with rapid ventricular response;  paroxysmal 09/18/2013  . Chest pain 10/08/2012  . D-dimer, elevated, pt. on pradaxa 09/11/2011  . Chest pain, negative MI, resolved once heart rate slowed, secondary to rapid a. fib 09/11/2011  . Paroxysmal a-fib, recurrent 07/13/12- (on Propafenone)  05/27/2011  . Sinus node dysfunction ([place]) 05/27/2011  . Hypothyroid 05/27/2011  . 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 Anxiety 05/27/2011  . Chronic anticoagulation 05/27/2011   PCP:  Merrilee Seashore, MD Pharmacy:   [facility], [place] - [place] [address] Phone: [phone] Fax: [phone]  [id number] [place], [place] [address] [place] [address] Phone: [phone] Fax: [phone]     Social Determinants of Health (SDOH) Interventions    Readmission Risk Interventions No flowsheet data found.

## 2019-01-23 NOTE — [place] FL2 (Signed)
[place] MEDICAID FL2 LEVEL OF CARE SCREENING TOOL     IDENTIFICATION  Patient Name: Jacqueline Kim Birthdate: 1946/01/15 Sex: female Admission Date (Current Location): 01/20/2019  [facility] and [place] Number:  Herbalist and Address:  [facility] [place], [place] [address] [place], [place] [address]      Provider Number: [id number]  Attending Physician Name and Address:  Mariel Aloe, MD  Relative Name and Phone Number:  Claiborne Billings, daughter, [phone]    Current Level of Care: Hospital Recommended Level of Care: [place] Prior Approval Number:    Date Approved/Denied:   PASRR Number: [id number]  Discharge Plan: SNF    Current Diagnoses: Patient Active Problem List   Diagnosis Date Noted  . Hematoma of left thigh 01/21/2019  . Normocytic normochromic anemia 01/21/2019  . Hematoma 01/21/2019  . Facial laceration   . Rectal bleeding 02/01/2016  . Constipation 02/01/2016  . Dehydration 05/05/2015  . S/P left TKA 05/01/2015  . S/P knee replacement 05/01/2015  . Hypotension due to drugs 02/20/2015  . Cellulitis of left buttock 12/20/2014  . Cellulitis 12/20/2014  . AKI (acute kidney injury) ([place]) 12/20/2014  . Hyponatremia 12/20/2014  . Paroxysmal atrial fibrillation ([place]) 08/01/2014  . Irritable bowel syndrome 06/14/2014  . Fatigue 03/06/2014  . HTN (hypertension) 09/28/2013  . Atrial fibrillation with rapid ventricular response; paroxysmal 09/18/2013  . Chest pain 10/08/2012  . D-dimer, elevated, pt. on pradaxa 09/11/2011  . Chest pain, negative MI, resolved once heart rate slowed, secondary to rapid a. fib 09/11/2011  . Paroxysmal a-fib, recurrent 07/13/12- (on Propafenone)  05/27/2011  . Sinus node dysfunction ([place]) 05/27/2011  . Hypothyroid 05/27/2011  . 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 Anxiety 05/27/2011  . Chronic anticoagulation 05/27/2011    Orientation RESPIRATION BLADDER Height &  Weight     Self  Normal Incontinent Weight:   Height:     BEHAVIORAL SYMPTOMS/MOOD NEUROLOGICAL BOWEL NUTRITION STATUS      Continent Diet  AMBULATORY STATUS COMMUNICATION OF NEEDS Skin   Extensive Assist Verbally Normal                       Personal Care Assistance Level of Assistance  Bathing, Feeding, Dressing Bathing Assistance: Maximum assistance Feeding assistance: Limited assistance Dressing Assistance: Maximum assistance     Functional Limitations Info  Sight, Hearing, Speech Sight Info: Adequate Hearing Info: Adequate Speech Info: Adequate    SPECIAL CARE FACTORS FREQUENCY  PT (By licensed PT), OT (By licensed OT)     PT Frequency: 5x/week OT Frequency: 5x/week            Contractures Contractures Info: Not present    Additional Factors Info  Psychotropic Code Status Info: Full Allergies Info: Azithromycin, Epinephrine, Penicillins, Lidocaine, Metoprolol Psychotropic Info: Lexapro         Current Medications (01/23/2019):  This is the current hospital active medication list Current Facility-Administered Medications  Medication Dose Route Frequency Provider Last Rate Last Dose  . 0.9 %  sodium chloride infusion   Intravenous Continuous Mariel Aloe, MD 100 mL/hr at 01/22/19 0400    . acetaminophen (TYLENOL) tablet 650 mg  650 mg Oral Q6H PRN Rise Patience, MD       Or  . acetaminophen (TYLENOL) suppository 650 mg  650 mg Rectal Q6H PRN Rise Patience, MD      . atenolol (TENORMIN) tablet 25 mg  25 mg Oral BID  Eduard Clos, MD   25 mg at 01/23/19 3419  . ciprofloxacin (CIPRO) IVPB 400 mg  400 mg Intravenous Q12H Eduard Clos, MD 200 mL/hr at 01/23/19 0200 400 mg at 01/23/19 0200  . diltiazem (CARDIZEM CD) 24 hr capsule 120 mg  120 mg Oral QHS Eduard Clos, MD   120 mg at 01/22/19 2127  . docusate sodium (COLACE) capsule 100 mg  100 mg Oral BID Eduard Clos, MD   100 mg at 01/23/19 0855  . escitalopram  (LEXAPRO) tablet 10 mg  10 mg Oral Daily Eduard Clos, MD   10 mg at 01/23/19 0853  . HYDROmorphone (DILAUDID) injection 0.5 mg  0.5 mg Intravenous Q3H PRN Eduard Clos, MD   0.5 mg at 01/23/19 0805  . hydroxychloroquine (PLAQUENIL) tablet 200 mg  200 mg Oral Daily Eduard Clos, MD   200 mg at 01/23/19 0853  . levothyroxine (SYNTHROID) tablet 112 mcg  112 mcg Oral Q0600 Eduard Clos, MD   112 mcg at 01/23/19 3790  . metroNIDAZOLE (METROGEL) 0.75 % gel 1 application  1 application Topical Daily Eduard Clos, MD   1 application at 01/23/19 [phone]  . midodrine (PROAMATINE) tablet 2.5 mg  2.5 mg Oral TID WC Eduard Clos, MD   2.5 mg at 01/23/19 0854  . ondansetron (ZOFRAN) tablet 4 mg  4 mg Oral Q6H PRN Eduard Clos, MD       Or  . ondansetron [facility]) injection 4 mg  4 mg Intravenous Q6H PRN Eduard Clos, MD      . propafenone (RYTHMOL) tablet 150 mg  150 mg Oral Q24H Eduard Clos, MD   150 mg at 01/22/19 1331  . propafenone (RYTHMOL) tablet 225 mg  225 mg Oral BID AC & HS Eduard Clos, MD   225 mg at 01/23/19 7353  . sodium chloride flush (NS) 0.9 % injection 10-40 mL  10-40 mL Intracatheter PRN Eduard Clos, MD      . Tdap (BOOSTRIX) injection 0.5 mL  0.5 mL Intramuscular Once Eduard Clos, MD         Discharge Medications: Please see discharge summary for a list of discharge medications.  Relevant Imaging Results:  Relevant Lab Results:   Additional Information SSN: [id number]     COVID negative on 01/20/19  Mearl Latin, LCSW

## 2019-01-24 ENCOUNTER — Inpatient Hospital Stay (HOSPITAL_COMMUNITY): Payer: Medicare Other

## 2019-01-24 LAB — BASIC METABOLIC PANEL
Anion gap: 7 (ref 5–15)
BUN: 6 mg/dL — ABNORMAL LOW (ref 8–23)
CO2: 25 mmol/L (ref 22–32)
Calcium: 8.4 mg/dL — ABNORMAL LOW (ref 8.9–10.3)
Chloride: 96 mmol/L — ABNORMAL LOW (ref 98–111)
Creatinine, Ser: 0.73 mg/dL (ref 0.44–1.00)
GFR calc Af Amer: >60 mL/min (ref >60)
GFR calc non Af Amer: >60 mL/min (ref >60)
Glucose, Bld: 134 mg/dL — ABNORMAL HIGH (ref 70–99)
Potassium: 4.2 mmol/L (ref 3.5–5.1)
Sodium: 128 mmol/L — ABNORMAL LOW (ref 135–145)

## 2019-01-24 LAB — CBC
HCT: 29.6 % — ABNORMAL LOW (ref 36.0–46.0)
Hemoglobin: 9.8 g/dL — ABNORMAL LOW (ref 12.0–15.0)
MCH: 30.5 pg (ref 26.0–34.0)
MCHC: 33.1 g/dL (ref 30.0–36.0)
MCV: 92.2 fL (ref 80.0–100.0)
Platelets: 253 10*3/uL (ref 150–400)
RBC: 3.21 MIL/uL — ABNORMAL LOW (ref 3.87–5.11)
RDW: 13.5 % (ref 11.5–15.5)
WBC: 6.5 10*3/uL (ref 4.0–10.5)
nRBC: 0 % (ref 0.0–0.2)

## 2019-01-24 MED ORDER — LINACLOTIDE 72 MCG PO CAPS
72.0000 ug | ORAL_CAPSULE | Freq: Every day | ORAL | Status: DC
  Administered 2019-01-24 – 2019-01-26 (×3): 72 ug via ORAL
  Filled 2019-01-24 (×4): qty 1

## 2019-01-24 MED ORDER — POLYETHYLENE GLYCOL 3350 17 G PO PACK
17.0000 g | PACK | Freq: Every day | ORAL | Status: DC
  Administered 2019-01-24 – 2019-01-26 (×3): 17 g via ORAL
  Filled 2019-01-24 (×3): qty 1

## 2019-01-24 NOTE — TOC Progression Note (Addendum)
Transition of Care [facility]) - Progression Note    Patient Details  Name: Jacqueline Kim MRN: [id number] Date of Birth: 13-Nov-1945  Transition of Care [facility]) CM/SW [place], LCSW Phone Number: 01/24/2019, 2:59 PM  Clinical Narrative:    CSW provided SNF bed offers to patient's daughter.  She has selected [facility] as her top choice. Will reach out to the facility for bed availability when stable. Patient will not require a new COVID test if accepted by [facility].   Expected Discharge Plan: [place] Barriers to Discharge: Continued Medical Work up  Expected Discharge Plan and Services Expected Discharge Plan: [place] In-house Referral: Clinical Social Work Discharge Planning Services: NA Post Acute Care Choice: [place] Living arrangements for the past 2 months: Apartment                 DME Arranged: N/A DME Agency: NA       HH Arranged: NA HH Agency: NA         Social Determinants of Health (SDOH) Interventions    Readmission Risk Interventions No flowsheet data found.

## 2019-01-24 NOTE — Care Management Important Message (Signed)
Important Message  Patient Details  Name: Jacqueline Kim MRN: [id number] Date of Birth: 19-Nov-1945   Medicare Important Message Given:  Yes     [name] [address] 01/24/2019, 4:31 PM

## 2019-01-24 NOTE — Progress Notes (Signed)
PROGRESS NOTE    Jacqueline Kim  LKG:[id number] DOB: 1945-08-21 DOA: 01/20/2019 PCP: Merrilee Seashore, MD   Brief Narrative: Jacqueline Kim is a 73 y.o. 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 Patient presented secondary to a fall suffering a thigh hematoma and left periorbital laceration.   Assessment & Plan:   Principal Problem:   Hematoma of left thigh Active Problems:   Hypothyroid   Pacemaker Medtronic  (REVO) placed10/23/12   Paroxysmal atrial fibrillation (HCC)   Normocytic normochromic anemia   Hematoma   Left thigh hematoma Secondary to trauma. Significant blood loss. Worse in setting of Eliquis use. Received only 1 of 2 units of PRBC secondary to hospital protocol. Hemoglobin labile. Hematoma appears larger but hemoglobin stable yesterday -Continue observation -Repeat Hip CT -Repeat CBC today  Abdominal pain Patient has some chronic abdominal pain but this is worsened. Bladder scan and x-ray unremarkable for etiology. -CT abdomen/pelvis  Fall Resultant left periorbital laceration and hematoma.  Paroxysmal atrial fibrillation Tachybrady syndrome S/p pacemaker -Continue diltiazem, atenolol, propafenone -Hold Eliquis for 2 weeks  Acute blood loss anemia Chronic anemia Secondary to hematoma. As mentioned above.  Hypothyroidism -Continue Synthroid  UTI Started on ceftriaxone. Urine culture significant for E. Coli. Associated urinary retention -Ciprofloxacin x3 day treatment course  Acute urinary retention Patient is s/p in out catheterization on 8/8. Appears resolved. Likely secondary to above infection.  Rheumatoid arthritis -Continue Plaquenil  Abnormal telemetry Resolved. No evidence of ST elevation MI.  Irritable bowel syndrome -Resume Lin[place]  Hyponatremia Chronic. Stable.  Intermittent confusion No official  diagnosis but occurs at home as well per report.   DVT prophylaxis: SCDs Code Status:   Code Status: Full Code Family Communication: None at bedside Disposition Plan: Discharge to SNF when medically stable with stable hemoglobin   Consultants:   Cardiology  General surgery/Trauma  Procedures:   None  Antimicrobials:  Ciprofloxacin   Subjective: Abdominal pain in lower abdomen  Objective: Vitals:   01/23/19 0621 01/23/19 1444 01/23/19 2141 01/24/19 0522  BP: 140/71 121/61 (!) 142/83 (!) 143/78  Pulse: 63 65 64 (!) 59  Resp: 18 18 17 18   Temp: 98.3 F (36.8 C) 98.4 F (36.9 C) 98.2 F (36.8 C) 97.8 F (36.6 C)  TempSrc:  Oral Oral   SpO2: 96% 97% 95% 97%    Intake/Output Summary (Last 24 hours) at 01/24/2019 1227 Last data filed at 01/24/2019 0843 Gross per 24 hour  Intake 480 ml  Output 1206 ml  Net -726 ml   There were no vitals filed for this visit.  Examination:  General exam: Appears calm and comfortable Respiratory system: Clear to auscultation. Respiratory effort normal. Cardiovascular system: S1 & S2 heard, RRR. No murmurs, rubs, gallops or clicks. Gastrointestinal system: Abdomen is nondistended, soft and nontender. Some abdominal fullness felt. Normal bowel sounds heard. Central nervous system: Alert and oriented. No focal neurological deficits. Extremities: No edema. No calf tenderness. Left thigh with soft, tender mass over lateral aspect with overlying ecchymosis Skin: No cyanosis. No rashes    Data Reviewed: I have personally reviewed following labs and imaging studies  CBC: Recent Labs  Lab 01/20/19 1738  01/21/19 1651 01/22/19 0800 01/22/19 1520 01/23/19 0450 01/23/19 1638  WBC 6.3   < > 4.6 4.8 6.0 4.6 6.1  NEUTROABS 4.5  --   --   --   --   --   --  HGB 10.1*   < > 7.0* 8.1* 8.3* 7.8* 9.1*  HCT 30.6*   < > 21.0* 24.8* 26.4* 23.8* 27.1*  MCV 94.7   < > 92.9 92.5 97.1 93.0 91.2  PLT 238   < > 190 183 173 175 226   < > =  values in this interval not displayed.   Basic Metabolic Panel: Recent Labs  Lab 01/20/19 1738 01/21/19 0322 01/22/19 0800  NA 130* 129* 129*  K 4.1 4.2 4.5  CL 98 97* 99  CO2 23 25 23   GLUCOSE 98 99 87  BUN 12 11 8   CREATININE 0.85 0.87 0.78  CALCIUM 9.0 8.9 8.4*   GFR: CrCl cannot be calculated (Unknown ideal weight.). Liver Function Tests: No results for input(s): AST, ALT, ALKPHOS, BILITOT, PROT, ALBUMIN in the last 168 hours. No results for input(s): LIPASE, AMYLASE in the last 168 hours. No results for input(s): AMMONIA in the last 168 hours. Coagulation Profile: Recent Labs  Lab 01/20/19 1738  INR 1.4*   Cardiac Enzymes: No results for input(s): CKTOTAL, CKMB, CKMBINDEX, TROPONINI in the last 168 hours. BNP (last 3 results) No results for input(s): PROBNP in the last 8760 hours. HbA1C: No results for input(s): HGBA1C in the last 72 hours. CBG: No results for input(s): GLUCAP in the last 168 hours. Lipid Profile: No results for input(s): CHOL, HDL, LDLCALC, TRIG, CHOLHDL, LDLDIRECT in the last 72 hours. Thyroid Function Tests: No results for input(s): TSH, T4TOTAL, FREET4, T3FREE, THYROIDAB in the last 72 hours. Anemia Panel: No results for input(s): VITAMINB12, FOLATE, FERRITIN, TIBC, IRON, RETICCTPCT in the last 72 hours. Sepsis Labs: No results for input(s): PROCALCITON, LATICACIDVEN in the last 168 hours.  Recent Results (from the past 240 hour(s))  Urine Culture     Status: Abnormal   Collection Time: 01/20/19  9:44 PM   Specimen: Urine, Random  Result Value Ref Range Status   Specimen Description URINE, RANDOM  Final   Special Requests   Final    NONE Performed at [facility], [address] [place], [place] [address]    Culture >=100,000 COLONIES/mL ESCHERICHIA COLI (A)  Final   Report Status 01/23/2019 FINAL  Final   Organism ID, Bacteria ESCHERICHIA COLI (A)  Final      Susceptibility   Escherichia coli - MIC*    AMPICILLIN 8  SENSITIVE Sensitive     CEFAZOLIN <=4 SENSITIVE Sensitive     CEFTRIAXONE <=1 SENSITIVE Sensitive     CIPROFLOXACIN <=0.25 SENSITIVE Sensitive     GENTAMICIN <=1 SENSITIVE Sensitive     IMIPENEM <=0.25 SENSITIVE Sensitive     NITROFURANTOIN <=16 SENSITIVE Sensitive     TRIMETH/SULFA >=320 RESISTANT Resistant     AMPICILLIN/SULBACTAM 4 SENSITIVE Sensitive     PIP/TAZO <=4 SENSITIVE Sensitive     Extended ESBL NEGATIVE Sensitive     * >=100,000 COLONIES/mL ESCHERICHIA COLI  SARS Coronavirus 2 [facility](Hospital order, Performed in [facility] lab) Nasopharyngeal Nasopharyngeal Swab     Status: None   Collection Time: 01/20/19 11:55 PM   Specimen: Nasopharyngeal Swab  Result Value Ref Range Status   SARS Coronavirus 2 NEGATIVE NEGATIVE Final    Comment: (NOTE) If result is NEGATIVE SARS-CoV-2 target nucleic acids are NOT DETECTED. The SARS-CoV-2 RNA is generally detectable in upper and lower  respiratory specimens during the acute phase of infection. The lowest  concentration of SARS-CoV-2 viral copies this assay can detect is 250  copies / mL. A negative result  does not preclude SARS-CoV-2 infection  and should not be used as the sole basis for treatment or other  patient management decisions.  A negative result may occur with  improper specimen collection / handling, submission of specimen other  than nasopharyngeal swab, presence of viral mutation(s) within the  areas targeted by this assay, and inadequate number of viral copies  (<250 copies / mL). A negative result must be combined with clinical  observations, patient history, and epidemiological information. If result is POSITIVE SARS-CoV-2 target nucleic acids are DETECTED. The SARS-CoV-2 RNA is generally detectable in upper and lower  respiratory specimens dur ing the acute phase of infection.  Positive  results are indicative of active infection with SARS-CoV-2.  Clinical  correlation with patient history and other  diagnostic information is  necessary to determine patient infection status.  Positive results do  not rule out bacterial infection or co-infection with other viruses. If result is PRESUMPTIVE POSTIVE SARS-CoV-2 nucleic acids MAY BE PRESENT.   A presumptive positive result was obtained on the submitted specimen  and confirmed on repeat testing.  While 2019 novel coronavirus  (SARS-CoV-2) nucleic acids may be present in the submitted sample  additional confirmatory testing may be necessary for epidemiological  and / or clinical management purposes  to differentiate between  SARS-CoV-2 and other Sarbecovirus currently known to infect humans.  If clinically indicated additional testing with an alternate test  methodology [phone]) is advised. The SARS-CoV-2 RNA is generally  detectable in upper and lower respiratory sp ecimens during the acute  phase of infection. The expected result is Negative. Fact Sheet for Patients:  BoilerBrush.com.cy Fact Sheet for Healthcare Providers: https://pope.com/ This test is not yet approved or cleared by the [place] FDA and has been authorized for detection and/or diagnosis of SARS-CoV-2 by FDA under an Emergency Use Authorization (EUA).  This EUA will remain in effect (meaning this test can be used) for the duration of the COVID-19 declaration under Section 564(b)(1) of the Act, 21 U.S.C. section 360bbb-3(b)(1), unless the authorization is terminated or revoked sooner. Performed at [facility], [address] [place], [place] [address]          Radiology Studies: Dg Abd Portable 1v  Result Date: 01/23/2019 CLINICAL DATA:  Abdominal pain.  Constipation and diarrhea. EXAM: PORTABLE ABDOMEN - 1 VIEW COMPARISON:  12/27/2018 CT FINDINGS: Single supine view of the abdomen and pelvis. Convex right lumbar spine curvature. Gas within normal caliber large and small bowel. No abnormal abdominal  calcifications. No appendicolith. No free intraperitoneal air. Incompletely imaged pacer. Right hemidiaphragm elevation. IMPRESSION: No acute findings. Electronically Signed   By: Jeronimo Greaves M.D.   On: 01/23/2019 19:45        Scheduled Meds: . atenolol  25 mg Oral BID  . ciprofloxacin  250 mg Oral BID  . diltiazem  120 mg Oral QHS  . docusate sodium  100 mg Oral BID  . escitalopram  10 mg Oral Daily  . hydroxychloroquine  200 mg Oral Daily  . levothyroxine  112 mcg Oral Q0600  . linaclotide  72 mcg Oral QAC breakfast  . metroNIDAZOLE  1 application Topical Daily  . midodrine  2.5 mg Oral TID WC  . polyethylene glycol  17 g Oral Daily  . propafenone  150 mg Oral Q24H  . propafenone  225 mg Oral BID AC & HS  . Tdap  0.5 mL Intramuscular Once   Continuous Infusions:    LOS: 3 days  Jacquelin Hawkingalph Tayvon Culley, MD [facility] 01/24/2019, 12:27 PM  If 7PM-7AM, please contact night-coverage [email or web address]

## 2019-01-25 ENCOUNTER — Other Ambulatory Visit: Payer: Self-pay | Admitting: Physician Assistant

## 2019-01-25 LAB — TYPE AND SCREEN
ABO/RH(D): A POS
Antibody Screen: NEGATIVE
Unit division: 0
Unit division: 0

## 2019-01-25 LAB — BPAM RBC
Blood Product Expiration Date: 202009032359
Blood Product Expiration Date: 202009032359
ISSUE DATE / TIME: 202008080231
Unit Type and Rh: 6200
Unit Type and Rh: 6200

## 2019-01-25 MED ORDER — SODIUM CHLORIDE 0.9 % IV SOLN
INTRAVENOUS | Status: DC | PRN
  Administered 2019-01-25: 450 mL via INTRAVENOUS

## 2019-01-25 MED ORDER — SODIUM CHLORIDE 0.9 % IV BOLUS
1000.0000 mL | Freq: Once | INTRAVENOUS | Status: AC
  Administered 2019-01-25: 1000 mL via INTRAVENOUS

## 2019-01-25 NOTE — Clinical Social Work Note (Signed)
Per admissions director, Jacqueline Kim with [facility], they can take patient on Wed., 8/12. MD and nurse case manager advised.  Jacqueline Kim, MSW, LCSW Licensed Clinical Social Worker [place] [phone]

## 2019-01-25 NOTE — Progress Notes (Signed)
PROGRESS NOTE    Jacqueline Kim  JJK:[id number] DOB: 12-30-1945 DOA: 01/20/2019 PCP: Merrilee Seashore, MD   Brief Narrative: Jacqueline Kim is a 73 y.o. 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 Patient presented secondary to a fall suffering a thigh hematoma and left periorbital laceration.   Assessment & Plan:   Principal Problem:   Hematoma of left thigh Active Problems:   Hypothyroid   Pacemaker [employer]  (REVO) placed10/23/12   Paroxysmal atrial fibrillation (HCC)   Normocytic normochromic anemia   Hematoma   Left thigh hematoma Secondary to trauma from fall. Significant blood loss. Worse in setting of Eliquis use. Hemoglobin of 10.1 on admission, down to a low of 7. Patient received 1 unit of PRBC with improvement to 8.1 and hemoglobin is now stable. Hemoglobin labile. Hematoma stable. Hemoglobin stable. Stable on CT -Continue observation   Abdominal pain Patient has some chronic abdominal pain but this is worsened. Bladder scan and x-ray unremarkable for etiology. CT abdomen unremarkable for etiology. Pain improved and likely secondary to patient's history of IBS.  Fall Resultant left periorbital laceration and hematoma. Sutures in place. Can likely remove 5 days after placement.  Paroxysmal atrial fibrillation Tachybrady syndrome S/p pacemaker -Continue diltiazem, atenolol, propafenone -Hold Eliquis for 2 weeks  Acute blood loss anemia Chronic anemia Secondary to hematoma. As mentioned above. Stable.  Hypothyroidism -Continue Synthroid  UTI Started on ceftriaxone. Urine culture significant for E. Coli. Associated urinary retention. Completed course.  Acute urinary retention Patient is s/p in out catheterization on 8/8. Appears resolved. Likely secondary to above infection.  Rheumatoid arthritis -Continue Plaquenil  Abnormal  telemetry Resolved. No evidence of ST elevation MI.  Irritable bowel syndrome -Continue Linzess  Hyponatremia Chronic. Slight decrease -Given 1L bolus -BMP in AM  Intermittent confusion No official diagnosis but occurs at home as well per report.   DVT prophylaxis: SCDs Code Status:   Code Status: Full Code Family Communication: None at bedside Disposition Plan: Discharge to SNF in AM when bed is available.   Consultants:   Cardiology  General surgery/Trauma  Procedures:   None  Antimicrobials:  Ciprofloxacin   Subjective: Pain improved. No concerns other than wanting to be discharged.  Objective: Vitals:   01/24/19 2219 01/25/19 0719 01/25/19 0857 01/25/19 1327  BP: (!) 145/74 135/78 (!) 156/73 139/76  Pulse: 61 61 67 60  Resp: 16 16  18   Temp: 98.6 F (37 C) 98.3 F (36.8 C)  97.8 F (36.6 C)  TempSrc:    Axillary  SpO2: 96% 100%  94%    Intake/Output Summary (Last 24 hours) at 01/25/2019 1536 Last data filed at 01/25/2019 1511 Gross per 24 hour  Intake 1212.42 ml  Output 425 ml  Net 787.42 ml   There were no vitals filed for this visit.  Examination:  General exam: Appears calm and comfortable Respiratory system: Clear to auscultation. Respiratory effort normal. Cardiovascular system: S1 & S2 heard, RRR. No murmurs, rubs, gallops or clicks. Gastrointestinal system: Abdomen is nondistended, soft and nontender. No organomegaly or masses felt. Normal bowel sounds heard. Central nervous system: Alert and oriented. No focal neurological deficits. Extremities: No edema. No calf tenderness Skin: No cyanosis. Hematoma of left hip. Ecchymosis. Psychiatry: Judgement and insight appear normal. Mood & affect appropriate.     Data Reviewed: I have personally reviewed following labs and imaging studies  CBC: Recent Labs  Lab 01/20/19 1738  01/22/19  0800 01/22/19 1520 01/23/19 0450 01/23/19 1638 01/24/19 1213  WBC 6.3   < > 4.8 6.0 4.6 6.1 6.5   NEUTROABS 4.5  --   --   --   --   --   --   HGB 10.1*   < > 8.1* 8.3* 7.8* 9.1* 9.8*  HCT 30.6*   < > 24.8* 26.4* 23.8* 27.1* 29.6*  MCV 94.7   < > 92.5 97.1 93.0 91.2 92.2  PLT 238   < > 183 173 175 226 253   < > = values in this interval not displayed.   Basic Metabolic Panel: Recent Labs  Lab 01/20/19 1738 01/21/19 0322 01/22/19 0800 01/24/19 1433  NA 130* 129* 129* 128*  K 4.1 4.2 4.5 4.2  CL 98 97* 99 96*  CO2 23 25 23 25   GLUCOSE 98 99 87 134*  BUN 12 11 8  6*  CREATININE 0.85 0.87 0.78 0.73  CALCIUM 9.0 8.9 8.4* 8.4*   GFR: CrCl cannot be calculated (Unknown ideal weight.). Liver Function Tests: No results for input(s): AST, ALT, ALKPHOS, BILITOT, PROT, ALBUMIN in the last 168 hours. No results for input(s): LIPASE, AMYLASE in the last 168 hours. No results for input(s): AMMONIA in the last 168 hours. Coagulation Profile: Recent Labs  Lab 01/20/19 1738  INR 1.4*   Cardiac Enzymes: No results for input(s): CKTOTAL, CKMB, CKMBINDEX, TROPONINI in the last 168 hours. BNP (last 3 results) No results for input(s): PROBNP in the last 8760 hours. HbA1C: No results for input(s): HGBA1C in the last 72 hours. CBG: No results for input(s): GLUCAP in the last 168 hours. Lipid Profile: No results for input(s): CHOL, HDL, LDLCALC, TRIG, CHOLHDL, LDLDIRECT in the last 72 hours. Thyroid Function Tests: No results for input(s): TSH, T4TOTAL, FREET4, T3FREE, THYROIDAB in the last 72 hours. Anemia Panel: No results for input(s): VITAMINB12, FOLATE, FERRITIN, TIBC, IRON, RETICCTPCT in the last 72 hours. Sepsis Labs: No results for input(s): PROCALCITON, LATICACIDVEN in the last 168 hours.  Recent Results (from the past 240 hour(s))  Urine Culture     Status: Abnormal   Collection Time: 01/20/19  9:44 PM   Specimen: Urine, Random  Result Value Ref Range Status   Specimen Description URINE, RANDOM  Final   Special Requests   Final    NONE Performed at [facility], [address] [place], [place] [address]    Culture >=100,000 COLONIES/mL ESCHERICHIA COLI (A)  Final   Report Status 01/23/2019 FINAL  Final   Organism ID, Bacteria ESCHERICHIA COLI (A)  Final      Susceptibility   Escherichia coli - MIC*    AMPICILLIN 8 SENSITIVE Sensitive     CEFAZOLIN <=4 SENSITIVE Sensitive     CEFTRIAXONE <=1 SENSITIVE Sensitive     CIPROFLOXACIN <=0.25 SENSITIVE Sensitive     GENTAMICIN <=1 SENSITIVE Sensitive     IMIPENEM <=0.25 SENSITIVE Sensitive     NITROFURANTOIN <=16 SENSITIVE Sensitive     TRIMETH/SULFA >=320 RESISTANT Resistant     AMPICILLIN/SULBACTAM 4 SENSITIVE Sensitive     PIP/TAZO <=4 SENSITIVE Sensitive     Extended ESBL NEGATIVE Sensitive     * >=100,000 COLONIES/mL ESCHERICHIA COLI  SARS Coronavirus 2 [facility](Hospital order, Performed in [facility] hospital lab) Nasopharyngeal Nasopharyngeal Swab     Status: None   Collection Time: 01/20/19 11:55 PM   Specimen: Nasopharyngeal Swab  Result Value Ref Range Status   SARS Coronavirus 2 NEGATIVE NEGATIVE Final  Comment: (NOTE) If result is NEGATIVE SARS-CoV-2 target nucleic acids are NOT DETECTED. The SARS-CoV-2 RNA is generally detectable in upper and lower  respiratory specimens during the acute phase of infection. The lowest  concentration of SARS-CoV-2 viral copies this assay can detect is 250  copies / mL. A negative result does not preclude SARS-CoV-2 infection  and should not be used as the sole basis for treatment or other  patient management decisions.  A negative result may occur with  improper specimen collection / handling, submission of specimen other  than nasopharyngeal swab, presence of viral mutation(s) within the  areas targeted by this assay, and inadequate number of viral copies  (<250 copies / mL). A negative result must be combined with clinical  observations, patient history, and epidemiological information. If result is POSITIVE SARS-CoV-2 target nucleic acids are  DETECTED. The SARS-CoV-2 RNA is generally detectable in upper and lower  respiratory specimens dur ing the acute phase of infection.  Positive  results are indicative of active infection with SARS-CoV-2.  Clinical  correlation with patient history and other diagnostic information is  necessary to determine patient infection status.  Positive results do  not rule out bacterial infection or co-infection with other viruses. If result is PRESUMPTIVE POSTIVE SARS-CoV-2 nucleic acids MAY BE PRESENT.   A presumptive positive result was obtained on the submitted specimen  and confirmed on repeat testing.  While 2019 novel coronavirus  (SARS-CoV-2) nucleic acids may be present in the submitted sample  additional confirmatory testing may be necessary for epidemiological  and / or clinical management purposes  to differentiate between  SARS-CoV-2 and other Sarbecovirus currently known to infect humans.  If clinically indicated additional testing with an alternate test  methodology [phone]) is advised. The SARS-CoV-2 RNA is generally  detectable in upper and lower respiratory sp ecimens during the acute  phase of infection. The expected result is Negative. Fact Sheet for Patients:  BoilerBrush.com.cy Fact Sheet for Healthcare Providers: https://pope.com/ This test is not yet approved or cleared by the [place] FDA and has been authorized for detection and/or diagnosis of SARS-CoV-2 by FDA under an Emergency Use Authorization (EUA).  This EUA will remain in effect (meaning this test can be used) for the duration of the COVID-19 declaration under Section 564(b)(1) of the Act, 21 U.S.C. section 360bbb-3(b)(1), unless the authorization is terminated or revoked sooner. Performed at [facility], [address] [place], [place] [address]          Radiology Studies: Ct Abdomen Pelvis Wo Contrast  Result Date: 01/24/2019 CLINICAL  DATA:  Abdominal pain, history of left hip hematoma EXAM: CT ABDOMEN AND PELVIS WITHOUT CONTRAST TECHNIQUE: Multidetector CT imaging of the abdomen and pelvis was performed following the standard protocol without IV contrast. COMPARISON:  12/27/2018 FINDINGS: Lower chest: Small pleural effusions are noted bilaterally. No focal infiltrate or sizable effusion is seen. Hepatobiliary: No focal liver abnormality is seen. No gallstones, gallbladder wall thickening, or biliary dilatation. Pancreas: Unremarkable. No pancreatic ductal dilatation or surrounding inflammatory changes. Spleen: Normal in size without focal abnormality. Adrenals/Urinary Tract: Adrenal glands are within normal limits bilaterally. No renal calculi or obstructive changes are seen. The ureters are within limits. The bladder is partially distended. Stomach/Bowel: The appendix is not well visualized. No inflammatory changes are seen. No obstructive or inflammatory changes of the large or small bowel are noted. The stomach is within normal limits. Vascular/Lymphatic: Aortic atherosclerosis. No enlarged abdominal or pelvic lymph nodes. Reproductive: Status post  hysterectomy. No adnexal masses. Other: There is a subcutaneous hematoma identified in the lateral aspect of the left buttock similar to that seen on the prior exam. It has decreased in size now measuring 5.8 x 3.5 cm. No new hematoma is noted. Subcutaneous edema is noted. No free fluid is seen. Musculoskeletal: Degenerative changes of lumbar spine are noted. No acute fracture is seen. IMPRESSION: Improved hematoma in the left buttock laterally. The clinical worsening is likely related to spread of subcutaneous bruising. Small bilateral pleural effusions. No other focal abnormality is seen. Electronically Signed   By: Alcide Clever M.D.   On: 01/24/2019 13:07   Dg Abd Portable 1v  Result Date: 01/23/2019 CLINICAL DATA:  Abdominal pain.  Constipation and diarrhea. EXAM: PORTABLE ABDOMEN - 1 VIEW  COMPARISON:  12/27/2018 CT FINDINGS: Single supine view of the abdomen and pelvis. Convex right lumbar spine curvature. Gas within normal caliber large and small bowel. No abnormal abdominal calcifications. No appendicolith. No free intraperitoneal air. Incompletely imaged pacer. Right hemidiaphragm elevation. IMPRESSION: No acute findings. Electronically Signed   By: Jeronimo Greaves M.D.   On: 01/23/2019 19:45   Ct No Charge  Result Date: 01/24/2019 CLINICAL DATA:  Left hip pain after fall EXAM: CT ADDITIONAL VIEWS AT NO CHARGE TECHNIQUE: Reformatted images of the left hip from a concurrent CT of the abdomen and pelvis were submitted for interpretation. CONTRAST:  None. COMPARISON:  CT left hip 01/20/2019. FINDINGS: No acute fracture. Left hip joint alignment is maintained without dislocation. Similar mild degenerative changes of the left hip joint. Left SI joint is intact without diastasis. Mild left SI joint OA. Redemonstration of a large hyperdense collection within the posterolateral superficial soft tissues overlying the subtrochanteric aspect of the proximal left femur. Collection measures approximately 6.8 cm TR by 3.8 cm AP by 9.7 cm CC (series 8, image 46; series 7, image 51), previously measured 7.2 x 3.9 by 10.5 cm (01/20/2019). No new soft tissue fluid collections. There is moderate diffuse anasarca. IMPRESSION: 1. Minimal interval decrease in size of hyperdense collection posterolateral to the left hip, most likely representing evolving hematoma. 2. No fracture or malalignment of the left hip. Electronically Signed   By: Duanne Guess M.D.   On: 01/24/2019 13:08        Scheduled Meds: . atenolol  25 mg Oral BID  . diltiazem  120 mg Oral QHS  . docusate sodium  100 mg Oral BID  . escitalopram  10 mg Oral Daily  . hydroxychloroquine  200 mg Oral Daily  . levothyroxine  112 mcg Oral Q0600  . linaclotide  72 mcg Oral QAC breakfast  . metroNIDAZOLE  1 application Topical Daily  .  midodrine  2.5 mg Oral TID WC  . polyethylene glycol  17 g Oral Daily  . propafenone  150 mg Oral Q24H  . propafenone  225 mg Oral BID AC & HS  . Tdap  0.5 mL Intramuscular Once   Continuous Infusions: . sodium chloride 450 mL (01/25/19 1156)     LOS: 4 days     Jacquelin Hawking, MD [facility] 01/25/2019, 3:36 PM  If 7PM-7AM, please contact night-coverage [email or web address]

## 2019-01-25 NOTE — Progress Notes (Signed)
Physical Therapy Treatment Patient Details Name: Jacqueline Kim MRN: [id number] DOB: [birth year] Today's Date: 01/25/2019    History of Present Illness Pt is a 73 y.o. female admitted 01/20/19 after fall sustaining large L hip hematoma and facial laceration. Other imaging clear. PMH includes HTN, afib, pacemaker, glaucoma, DJD, depression.    PT Comments    Pt declining OOB mobility citing fatigue at end of the day. Agreeable to therex, performed bed level and discussed role of sub acute therapy at SNF. Pt plans to d/c there tomorrow, agree with this plan.   Follow Up Recommendations  Supervision/Assistance - 24 hour;SNF     Equipment Recommendations  None recommended by PT    Recommendations for Other Services       Precautions / Restrictions Precautions Precautions: Fall Precaution Comments: Orthostatic hypotension; L hip hematoma Restrictions Weight Bearing Restrictions: No    Mobility  Bed Mobility                  Transfers                    Ambulation/Gait                 Stairs             Wheelchair Mobility    Modified Rankin (Stroke Patients Only)       Balance Overall balance assessment: Needs assistance   Sitting balance-Leahy Scale: Fair       Standing balance-Leahy Scale: Poor Standing balance comment: Reliant on UE support and external assist                            Cognition Arousal/Alertness: Awake/alert Behavior During Therapy: Flat affect Overall Cognitive Status: History of cognitive impairments - at baseline Area of Impairment: Attention;Memory;Following commands;Safety/judgement;Awareness;Problem solving                   Current Attention Level: Sustained Memory: Decreased short-term memory Following Commands: Follows one step commands with increased time Safety/Judgement: Decreased awareness of safety;Decreased awareness of deficits Awareness: Emergent Problem Solving:  Requires verbal cues        Exercises General Exercises - Lower Extremity Quad Sets: 20 reps Gluteal Sets: 20 reps Heel Slides: 20 reps Hip ABduction/ADduction: 20 reps Straight Leg Raises: 20 reps    General Comments        Pertinent Vitals/Pain Faces Pain Scale: Hurts little more Pain Location: L hip Pain Descriptors / Indicators: Discomfort;Guarding    Home Living                      Prior Function            PT Goals (current goals can now be found in the care plan section) Acute Rehab PT Goals Patient Stated Goal: Stay living at home alone as long as possible PT Goal Formulation: With patient Time For Goal Achievement: 02/04/19 Potential to Achieve Goals: Fair Progress towards PT goals: Progressing toward goals    Frequency    Min 3X/week      PT Plan Current plan remains appropriate    Co-evaluation              AM-PAC PT "6 Clicks" Mobility   Outcome Measure  Help needed turning from your back to your side while in a flat bed without using bedrails?: A Little Help needed moving from lying on your back to  sitting on the side of a flat bed without using bedrails?: A Little Help needed moving to and from a bed to a chair (including a wheelchair)?: A Little Help needed standing up from a chair using your arms (e.g., wheelchair or bedside chair)?: A Lot Help needed to walk in hospital room?: A Little Help needed climbing 3-5 steps with a railing? : A Lot 6 Click Score: 16    End of Session Equipment Utilized During Treatment: Gait belt Activity Tolerance: Treatment limited secondary to medical complications (Comment)(symptomatic orthostatic hypotension ) Patient left: with call bell/phone within reach;in chair;with chair alarm set Nurse Communication: Mobility status PT Visit Diagnosis: Other abnormalities of gait and mobility (R26.89);Muscle weakness (generalized) (M62.81)     Time: 7035-0093 PT Time Calculation (min) (ACUTE  ONLY): 9 min  Charges:  $Therapeutic Exercise: 8-22 mins                     Etta Grandchild, PT, DPT Acute Rehabilitation Services Pager: [phone] Office: [phone]     Etta Grandchild 01/25/2019, 7:02 PM

## 2019-01-26 DIAGNOSIS — T148XXA Other injury of unspecified body region, initial encounter: Secondary | ICD-10-CM

## 2019-01-26 LAB — CBC WITH DIFFERENTIAL/PLATELET
Abs Immature Granulocytes: 0.08 10*3/uL — ABNORMAL HIGH (ref 0.00–0.07)
Basophils Absolute: 0.1 10*3/uL (ref 0.0–0.1)
Basophils Relative: 1 %
Eosinophils Absolute: 0 10*3/uL (ref 0.0–0.5)
Eosinophils Relative: 1 %
HCT: 28.9 % — ABNORMAL LOW (ref 36.0–46.0)
Hemoglobin: 9.6 g/dL — ABNORMAL LOW (ref 12.0–15.0)
Immature Granulocytes: 1 %
Lymphocytes Relative: 17 %
Lymphs Abs: 1 10*3/uL (ref 0.7–4.0)
MCH: 30.4 pg (ref 26.0–34.0)
MCHC: 33.2 g/dL (ref 30.0–36.0)
MCV: 91.5 fL (ref 80.0–100.0)
Monocytes Absolute: 0.8 10*3/uL (ref 0.1–1.0)
Monocytes Relative: 14 %
Neutro Abs: 3.9 10*3/uL (ref 1.7–7.7)
Neutrophils Relative %: 66 %
Platelets: 314 10*3/uL (ref 150–400)
RBC: 3.16 MIL/uL — ABNORMAL LOW (ref 3.87–5.11)
RDW: 13.3 % (ref 11.5–15.5)
WBC: 5.9 10*3/uL (ref 4.0–10.5)
nRBC: 0 % (ref 0.0–0.2)

## 2019-01-26 LAB — BASIC METABOLIC PANEL
Anion gap: 7 (ref 5–15)
BUN: 6 mg/dL — ABNORMAL LOW (ref 8–23)
CO2: 24 mmol/L (ref 22–32)
Calcium: 8.9 mg/dL (ref 8.9–10.3)
Chloride: 100 mmol/L (ref 98–111)
Creatinine, Ser: 0.87 mg/dL (ref 0.44–1.00)
GFR calc Af Amer: >60 mL/min (ref >60)
GFR calc non Af Amer: >60 mL/min (ref >60)
Glucose, Bld: 94 mg/dL (ref 70–99)
Potassium: 4.1 mmol/L (ref 3.5–5.1)
Sodium: 131 mmol/L — ABNORMAL LOW (ref 135–145)

## 2019-01-26 LAB — MAGNESIUM: Magnesium: 1.6 mg/dL — ABNORMAL LOW (ref 1.7–2.4)

## 2019-01-26 LAB — PHOSPHORUS: Phosphorus: 3 mg/dL (ref 2.5–4.6)

## 2019-01-26 MED ORDER — MAGNESIUM SULFATE 2 GM/50ML IV SOLN
2.0000 g | Freq: Once | INTRAVENOUS | Status: AC
  Administered 2019-01-26: 2 g via INTRAVENOUS
  Filled 2019-01-26: qty 50

## 2019-01-26 MED ORDER — POLYETHYLENE GLYCOL 3350 17 G PO PACK: 17.0000 g | PACK | Freq: Every day | ORAL | 0 refills | Status: DC

## 2019-01-26 MED ORDER — SODIUM CHLORIDE 0.9 % IV BOLUS
500.0000 mL | Freq: Once | INTRAVENOUS | Status: AC
  Administered 2019-01-26: 500 mL via INTRAVENOUS

## 2019-01-26 MED ORDER — HYDROCODONE-ACETAMINOPHEN 7.5-325 MG PO TABS: 1.0000 | ORAL_TABLET | Freq: Four times a day (QID) | ORAL | 0 refills | Status: DC | PRN

## 2019-01-26 MED ORDER — APIXABAN 5 MG PO TABS: 5.0000 mg | ORAL_TABLET | Freq: Two times a day (BID) | ORAL | Status: DC

## 2019-01-26 NOTE — Plan of Care (Signed)

## 2019-01-26 NOTE — Progress Notes (Signed)
Report called to [facility]. Questions answered to receiving nurse's satisfaction.   Hiram Comber, RN 01/26/2019 1:02 PM

## 2019-01-26 NOTE — Progress Notes (Signed)
OT Cancellation Note  Patient Details Name: Jacqueline Kim MRN: [id number] DOB: 03-10-1946   Cancelled Treatment:    Reason Eval/Treat Not Completed: Other (comment)(Pt awaiting transport to SNF within an hour.)  Pt politely declining need for OT as she was resting prior to discharge to SNF very soon. Re order OT as necessary.  Ebony Hail Harold Hedge) Marsa Aris OTR/L Acute Rehabilitation Services Pager: [phone] Office: [place] 01/26/2019, 2:55 PM

## 2019-01-26 NOTE — TOC Transition Note (Addendum)
Transition of Care [facility]) - CM/SW Discharge Note   Patient Details  Name: Jacqueline Kim MRN: [id number] Date of Birth: Aug 01, 1945  [facility] of [facility] CM/SW Contact:  Gelene Mink, [place] Phone Number: 01/26/2019, 12:53 PM   Clinical Narrative:     Patient will DC to: [place] date: 01/26/2019 Family notified: Yes Transport by: [place] is scheduled for 4:00pm *  Per MD patient ready for DC to . RN, patient, patient's family, and facility notified of DC. Discharge Summary and FL2 sent to facility. RN to call report prior to discharge [phone]). The patient will report to room 36.  DC packet on chart. Ambulance transport requested for patient.   CSW will sign off for now as social work intervention is no longer needed. Please consult [place] again if new needs arise.  Rodgers Likes, LCSW-A [place]/Clinical Social Work Department Cell: [phone]    Final next level of care: [place] Barriers to Discharge: No Barriers Identified   Patient Goals and CMS Choice Patient states their goals for this hospitalization and ongoing recovery are:: Pt will go to [place] for Rehab CMS Medicare.gov Compare Post Acute Care list provided to:: Patient Represenative (must comment) Choice offered to / list presented to : Adult Children  Discharge Placement   Existing PASRR number confirmed : 01/23/19          Patient chooses bed at: [facility] Patient to be transferred to facility by: [place] Name of family member notified: Claiborne Billings Patient and family notified of of transfer: 01/26/19  Discharge Plan and Services In-house Referral: Clinical Social Work Discharge Planning Services: NA Post Acute Care Choice: [place]          DME Arranged: N/A DME Agency: NA       HH Arranged: NA HH Agency: NA        Social Determinants of Health (SDOH) Interventions     Readmission Risk  Interventions No flowsheet data found.

## 2019-01-26 NOTE — Progress Notes (Signed)
Nsg Discharge Note  Admit Date:  01/20/2019 Discharge date: 01/26/2019   [name] to be D/C'd Skilled nursing facility per MD order.  AVS completed.    Discharge Medication: Allergies as of 01/26/2019      Reactions   Azithromycin Other (See Comments)   Causes heart rhythm issues Other reaction(s): Other (See Comments) Causes heart rhythm issues   Epinephrine Other (See Comments)   Causes A-Fib Other reaction(s): Increased Heart Rate (intolerance) States that it caused her heart to beat fast, so she will not take it. Causes A-Fib A-fib Triggers atrial fib   Penicillins Anaphylaxis   Has patient had a PCN reaction causing immediate rash, facial/tongue/throat swelling, SOB or lightheadedness with hypotension: Yes Has patient had a PCN reaction causing severe rash involving mucus membranes or skin necrosis: No Has patient had a PCN reaction that required hospitalization No Has patient had a PCN reaction occurring within the last 10 years: No If all of the above answers are "NO", then may proceed with Cephalosporin use.   Lidocaine Palpitations   Or any similar medication Other reaction(s): Other (See Comments) Or any similar medication   Metoprolol Other (See Comments)   alopecia Other reaction(s): Other (See Comments) alopecia      Medication List    STOP taking these medications   AMBULATORY NON FORMULARY MEDICATION     TAKE these medications   apixaban 5 MG Tabs tablet Commonly known as: Eliquis Take 1 tablet (5 mg total) by mouth 2 (two) times daily. WILL NEED CARDIOLOGY EVALUATION PRIOR TO RESUMING Start taking on: February 09, 2019 What changed:   how much to take  additional instructions  These instructions start on February 09, 2019. If you are unsure what to do until then, ask your doctor or other care provider.   atenolol 25 MG tablet Commonly known as: TENORMIN Take 1 tablet (25 mg total) by mouth 2 (two) times daily.   dicyclomine 10 MG  capsule Commonly known as: BENTYL TAKE ONE CAPSULE 3 TIMES DAILY BEFORE MEALS What changed:   how much to take  how to take this  when to take this  additional instructions   diltiazem 120 MG 24 hr capsule Commonly known as: CARDIZEM CD TAKE ONE CAPSULE BY MOUTH EVERY NIGHT AT BEDTIME   docusate sodium 100 MG capsule Commonly known as: COLACE Take 1 capsule (100 mg total) by mouth 2 (two) times daily.   escitalopram 20 MG tablet Commonly known as: LEXAPRO Take 10 mg by mouth daily.   estradiol 0.5 MG tablet Commonly known as: ESTRACE Take 0.5 mg by mouth daily.   HYDROcodone-acetaminophen 7.5-325 MG tablet Commonly known as: NORCO Take 1 tablet by mouth every 6 (six) hours as needed for moderate pain.   hydroxychloroquine 200 MG tablet Commonly known as: PLAQUENIL Take 200 mg by mouth daily.   levothyroxine 112 MCG tablet Commonly known as: SYNTHROID Take 112 mcg by mouth daily before breakfast.   linaclotide 72 MCG capsule Commonly known as: Linzess Take 1 capsule (72 mcg total) by mouth daily before breakfast.   losartan 25 MG tablet Commonly known as: COZAAR Take 25 mg by mouth 2 (two) times a day.   metroNIDAZOLE 1 % gel Commonly known as: METROGEL Apply 1 application topically daily.   midodrine 2.5 MG tablet Commonly known as: PROAMATINE Take 1 tablet (2.5 mg total) by mouth 3 (three) times daily with meals.   Nitroglycerin 0.4 % Oint Place 1 application rectally 3 (three) times daily.  polyethylene glycol 17 g packet Commonly known as: MIRALAX / GLYCOLAX Take 17 g by mouth daily. Start taking on: January 27, 2019   propafenone 150 MG tablet Commonly known as: RYTHMOL TAKE ONE AND ONE-HALF TABLETS BY MOUTH EVERY MORNING, ONE TABLET BY MOUTH MID-DAY, AND ONE AND ONE-HALF TABLETS AT BEDTIME What changed: additional instructions   traZODone 150 MG tablet Commonly known as: DESYREL Cut the half a tablet in half to make (37.5 mg) What changed:    how much to take  how to take this  when to take this  additional instructions   triamcinolone cream 0.1 % Commonly known as: KENALOG Apply 1 application topically 2 (two) times daily. To face       Discharge Assessment: Vitals:   01/26/19 0917 01/26/19 1436  BP: (!) 150/74 (!) 165/74  Pulse: 64 60  Resp:  16  Temp:  98 F (36.7 C)  SpO2: 94% 96%     D/c Instructions-Education: Discharge instructions given to patient/family with verbalized understanding. D/c education completed with patient/family including follow up instructions, medication list, d/c activities limitations if indicated, with other d/c instructions as indicated by MD - patient able to verbalize understanding, all questions fully answered. Patient instructed to return to ED, call 911, or call MD for any changes in condition.  Besides L forehead bruising/laceration and L hip hematoma, patient's skin clean, dry, and intact of any injuries.  Patient escorted via stretcher and [facility] ambulance services.   Lyndal Pulley, RN 01/26/2019 3:45 PM

## 2019-01-26 NOTE — Discharge Summary (Signed)
Physician Discharge Summary  Jacqueline Kim:[id number] DOB: [birth year] DOA: 01/20/2019  PCP: Georgianne Fick, MD  Admit date: 01/20/2019 Discharge date: 01/26/2019  Admitted From: Home Disposition: SNF (Initially Declined but now agreeable)  Recommendations for Outpatient Follow-up:  1. Follow up with PCP in 1-2 weeks 2. Follow up with General Surgery as an outpatient if necessary 3. Continue to Hold Eliquis for at least 2 weeks 4. Follo wup with Cardiology Dr. [name] within 1-2 weeks and remain off of Eliquis until re-evaluated by Cardiology  5. Please obtain CMP/CBC, Mag, Phos in one week 6. 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  Cardiology and general surgery were consulted and general surgery felt there is no intervention for her thigh hematoma.  She underwent 1 unit of PRBCs and repeat CT scan showed stability of the hematoma.  Cardiology evaluated and recommending holding anticoagulation for at least 2 weeks and recommended continuing diltiazem, atenolol and propafenone.  PT OT evaluated and recommended skilled nursing facility and patient is agreeable for rehabilitation and was deemed stable for discharge and will need to follow-up with PCP, cardiology as well as general surgery if necessary in outpatient setting.  Discharge Diagnoses:  Principal Problem:   Hematoma  of left thigh Active Problems:   Hypothyroid   Pacemaker [employer]  (REVO) placed10/23/12   Paroxysmal atrial fibrillation (HCC)   Normocytic normochromic anemia   Hematoma  Left thigh and Left Buttock Hematoma -Secondary to trauma from fall.  -Significant blood loss.  -Worse in setting of Eliquis use.  -Hemoglobin of 10.1 on admission, down to a low of 7.  -Patient received 1 unit of PRBC with improvement to 8.1 and hemoglobin is now stable.  -Hemoglobin is now stable.  -Repeat CT Scan showed "Improved hematoma in the left buttock laterally. The clinical worsening is likely related to spread of subcutaneous bruising." -General surgery has signed off the case. -Continue observation at skilled nursing facility and continue to hold Eliquis for at least 2 weeks  Abdominal Pain, Improved  -Patient has some chronic abdominal pain but this is worsened. Bladder scan and x-ray unremarkable for etiology.  -CT abdomen unremarkable for etiology.  -Pain improved and likely secondary to patient's history of IBS. -Continue electrolyte as well as docusate 100 mg p.o. twice daily as well as MiraLAX 17 g p.o. daily for bowel regimen  Fall -Resultant left periorbital laceration and hematoma.  -Sutures in place.  -Can likely remove 5 days after placement. -Continue physical therapy SNF  Paroxysmal Atrial Fibrillation Tachybrady Syndrome s/p PPM -S/p pacemaker -Continue Diltiazem, Atenolol, Propafenone -Continue toHold Eliquis for 2 weeks  Acute Blood Loss Anemia on Chronic Normocytic Anemia -Secondary to hematoma.  -Patient's hemoglobin/hematocrit is now stable at 9.6/28.9 -Continue monitor for signs and symptoms of bleeding; currently no overt bleeding noted -Patient feels that hematoma is resolving -Repeat CT scan on 01/24/2019 showed "Improved hematoma in the left buttock laterally. The clinical worsening is likely related to spread of subcutaneous bruising. Small bilateral pleural  effusions. No other focal abnormality is seen."  Hypothyroidism -Continue Levothyroxine 112 mcg  UTI -Started on Ceftriaxone and was transitioned to ciprofloxacin.  -Urine culture significant for E. Coli. Associated urinary retention.  -Completed Antibiotic Course with ciprofloxacin for 5 days.  Acute Urinary Retention -Patient is s/p in out catheterization on 8/8. Appears resolved.  -Likely secondary to above infection.  Rheumatoid Arthritis -Continue Hydroxychloroquine 20 mg daily  Abnormal Telemetry -Resolved. No evidence of ST elevation MI. -Cardiology evaluated and she will need to follow-up with Dr. [name] within 2 weeks -Continue with home diltiazem 120 g p.o. nightly, propafenone 150 mg p.o. at 12:00 every 24 hours as well as 225 mg p.o. twice daily before breakfast and bedtime along with Atenolol 25 mg p.o. twice daily  Irritable bowel syndrome -Continue Linaclotide 72 mcg po Daily   Hyponatremia -Chronic.  Patient sodium went from 129 and dropped to 128 but was given a liter normal saline yesterday and will give half a liter today and sodium is improved to 131 -Given 1L bolus yesterday and half liter today -Continue to monitor and trend and repeat CMP at SNF  Intermittent Confusion -No official diagnosis but occurs at home as well per report. -Will need an outpatient neurology evaluation if deemed necessary by PCP  Hypomagnesemia -Patient magnesium level is 1.6 -Replete with IV mag sulfate 2 g -Continue to monitor and replete as necessary -Repeat magnesium level in the AM  Discharge Instructions  Allergies as of 01/26/2019      Reactions   Azithromycin Other (See Comments)   Causes heart rhythm issues Other reaction(s): Other (See Comments) Causes heart rhythm issues   Epinephrine Other (See Comments)   Causes A-Fib Other reaction(s): Increased Heart Rate (intolerance) States that it caused her heart to beat fast, so she will not take it. Causes  A-Fib A-fib Triggers atrial fib   Penicillins Anaphylaxis   Has patient had a PCN reaction causing immediate rash, facial/tongue/throat swelling, SOB or lightheadedness with hypotension: Yes Has patient had a PCN reaction causing severe rash involving mucus membranes or skin necrosis: No Has patient had a PCN reaction that required hospitalization No Has patient had a PCN reaction occurring within the last 10 years: No If all of the above answers are "NO", then may proceed with Cephalosporin use.   Lidocaine Palpitations   Or any similar medication Other reaction(s): Other (See Comments) Or any similar medication   Metoprolol Other (See Comments)   alopecia Other reaction(s): Other (See Comments) alopecia      Medication List    STOP taking these medications   AMBULATORY NON FORMULARY MEDICATION     TAKE these medications   apixaban 5 MG Tabs tablet Commonly known as: Eliquis Take 1 tablet (5 mg total) by mouth 2 (two) times daily. WILL NEED CARDIOLOGY EVALUATION PRIOR TO RESUMING Start taking on: February 09, 2019 What changed:   how much to take  additional instructions  These instructions start on February 09, 2019. If you are unsure what to do until then, ask your doctor or other care provider.   atenolol 25 MG tablet Commonly known as: TENORMIN Take 1 tablet (25 mg total) by mouth 2 (two) times daily.   dicyclomine 10 MG capsule Commonly known as: BENTYL TAKE ONE CAPSULE 3 TIMES DAILY BEFORE MEALS What changed:   how much to take  how to take this  when to take this  additional instructions   diltiazem 120 MG 24 hr capsule Commonly known as: CARDIZEM  CD TAKE ONE CAPSULE BY MOUTH EVERY NIGHT AT BEDTIME   docusate sodium 100 MG capsule Commonly known as: COLACE Take 1 capsule (100 mg total) by mouth 2 (two) times daily.   escitalopram 20 MG tablet Commonly known as: LEXAPRO Take 10 mg by mouth daily.   estradiol 0.5 MG tablet Commonly known as:  ESTRACE Take 0.5 mg by mouth daily.   HYDROcodone-acetaminophen 7.5-325 MG tablet Commonly known as: NORCO Take 1 tablet by mouth every 6 (six) hours as needed for moderate pain.   hydroxychloroquine 200 MG tablet Commonly known as: PLAQUENIL Take 200 mg by mouth daily.   levothyroxine 112 MCG tablet Commonly known as: SYNTHROID Take 112 mcg by mouth daily before breakfast.   linaclotide 72 MCG capsule Commonly known as: Linzess Take 1 capsule (72 mcg total) by mouth daily before breakfast.   losartan 25 MG tablet Commonly known as: COZAAR Take 25 mg by mouth 2 (two) times a day.   metroNIDAZOLE 1 % gel Commonly known as: METROGEL Apply 1 application topically daily.   midodrine 2.5 MG tablet Commonly known as: PROAMATINE Take 1 tablet (2.5 mg total) by mouth 3 (three) times daily with meals.   Nitroglycerin 0.4 % Oint Place 1 application rectally 3 (three) times daily.   polyethylene glycol 17 g packet Commonly known as: MIRALAX / GLYCOLAX Take 17 g by mouth daily. Start taking on: January 27, 2019   propafenone 150 MG tablet Commonly known as: RYTHMOL TAKE ONE AND ONE-HALF TABLETS BY MOUTH EVERY MORNING, ONE TABLET BY MOUTH MID-DAY, AND ONE AND ONE-HALF TABLETS AT BEDTIME What changed: additional instructions   traZODone 150 MG tablet Commonly known as: DESYREL Cut the half a tablet in half to make (37.5 mg) What changed:   how much to take  how to take this  when to take this  additional instructions   triamcinolone cream 0.1 % Commonly known as: KENALOG Apply 1 application topically 2 (two) times daily. To face      Contact information for after-discharge care    Destination    [facility] Preferred SNF .   Service: Skilled Nursing Contact information: [address] [place] [id number] [phone]             Allergies  Allergen Reactions  . Azithromycin Other (See Comments)    Causes  heart rhythm issues Other reaction(s): Other (See Comments) Causes heart rhythm issues  . Epinephrine Other (See Comments)    Causes A-Fib Other reaction(s): Increased Heart Rate (intolerance) States that it caused her heart to beat fast, so she will not take it. Causes A-Fib A-fib Triggers atrial fib   . Penicillins Anaphylaxis    Has patient had a PCN reaction causing immediate rash, facial/tongue/throat swelling, SOB or lightheadedness with hypotension: Yes Has patient had a PCN reaction causing severe rash involving mucus membranes or skin necrosis: No Has patient had a PCN reaction that required hospitalization No Has patient had a PCN reaction occurring within the last 10 years: No If all of the above answers are "NO", then may proceed with Cephalosporin use.   . Lidocaine Palpitations    Or any similar medication Other reaction(s): Other (See Comments) Or any similar medication  . Metoprolol Other (See Comments)    alopecia Other reaction(s): Other (See Comments) alopecia   Consultations:  General Surgery  Cardiology  Procedures/Studies: Ct Abdomen Pelvis Wo Contrast  Result Date: 01/24/2019 CLINICAL DATA:  Abdominal pain, history of  left hip hematoma EXAM: CT ABDOMEN AND PELVIS WITHOUT CONTRAST TECHNIQUE: Multidetector CT imaging of the abdomen and pelvis was performed following the standard protocol without IV contrast. COMPARISON:  12/27/2018 FINDINGS: Lower chest: Small pleural effusions are noted bilaterally. No focal infiltrate or sizable effusion is seen. Hepatobiliary: No focal liver abnormality is seen. No gallstones, gallbladder wall thickening, or biliary dilatation. Pancreas: Unremarkable. No pancreatic ductal dilatation or surrounding inflammatory changes. Spleen: Normal in size without focal abnormality. Adrenals/Urinary Tract: Adrenal glands are within normal limits bilaterally. No renal calculi or obstructive changes are seen. The ureters are within limits.  The bladder is partially distended. Stomach/Bowel: The appendix is not well visualized. No inflammatory changes are seen. No obstructive or inflammatory changes of the large or small bowel are noted. The stomach is within normal limits. Vascular/Lymphatic: Aortic atherosclerosis. No enlarged abdominal or pelvic lymph nodes. Reproductive: Status post hysterectomy. No adnexal masses. Other: There is a subcutaneous hematoma identified in the lateral aspect of the left buttock similar to that seen on the prior exam. It has decreased in size now measuring 5.8 x 3.5 cm. No new hematoma is noted. Subcutaneous edema is noted. No free fluid is seen. Musculoskeletal: Degenerative changes of lumbar spine are noted. No acute fracture is seen. IMPRESSION: Improved hematoma in the left buttock laterally. The clinical worsening is likely related to spread of subcutaneous bruising. Small bilateral pleural effusions. No other focal abnormality is seen. Electronically Signed   By: Alcide Clever M.D.   On: 01/24/2019 13:07   Dg Thoracic Spine 2 View  Result Date: 01/20/2019 CLINICAL DATA:  Fall EXAM: THORACIC SPINE 2 VIEWS COMPARISON:  08/01/2014 chest x-ray FINDINGS: Minimal scoliosis. Left-sided cardiac pacing leads. Vertebral body heights are maintained. Degenerative changes of the mid to lower thoracic spine with narrowing and osteophyte IMPRESSION: No acute osseous abnormality Electronically Signed   By: Jasmine Pang M.D.   On: 01/20/2019 19:19   Dg Pelvis 1-2 Views  Result Date: 01/20/2019 CLINICAL DATA:  Left hip pain.  Fall. EXAM: PELVIS - 1-2 VIEW COMPARISON:  None. FINDINGS: There is no evidence of pelvic fracture or diastasis. No pelvic bone lesions are seen. IMPRESSION: Negative. Electronically Signed   By: Charlett Nose M.D.   On: 01/20/2019 19:18   Dg Tibia/fibula Left  Result Date: 01/20/2019 CLINICAL DATA:  Fall, left leg pain EXAM: LEFT TIBIA AND FIBULA - 2 VIEW COMPARISON:  None. FINDINGS: Prior left knee  replacement. Small bone fragments are noted off the superior pole of the left patella concerning for avulsed fragments. No tibia or fibular abnormality. IMPRESSION: Small avulsed fragments off the superior pole of the patella. Prior left knee replacement. Electronically Signed   By: Charlett Nose M.D.   On: 01/20/2019 19:21   Dg Tibia/fibula Right  Result Date: 01/20/2019 CLINICAL DATA:  Fall EXAM: RIGHT TIBIA AND FIBULA - 2 VIEW COMPARISON:  None. FINDINGS: There is no evidence of fracture or other focal bone lesions. Soft tissues are unremarkable. IMPRESSION: Negative. Electronically Signed   By: Jasmine Pang M.D.   On: 01/20/2019 19:18   Dg Ankle Complete Left  Result Date: 01/20/2019 CLINICAL DATA:  Fall EXAM: LEFT ANKLE COMPLETE - 3+ VIEW COMPARISON:  None. FINDINGS: There is no evidence of fracture, dislocation, or joint effusion. There is no evidence of arthropathy or other focal bone abnormality. Soft tissues are unremarkable. IMPRESSION: Negative. Electronically Signed   By: Charlett Nose M.D.   On: 01/20/2019 19:21   Ct Head Wo Contrast  Result Date: 01/20/2019 CLINICAL DATA:  Fall.  Head trauma, headache EXAM: CT HEAD WITHOUT CONTRAST CT MAXILLOFACIAL WITHOUT CONTRAST CT CERVICAL SPINE WITHOUT CONTRAST TECHNIQUE: Multidetector CT imaging of the head, cervical spine, and maxillofacial structures were performed using the standard protocol without intravenous contrast. Multiplanar CT image reconstructions of the cervical spine and maxillofacial structures were also generated. COMPARISON:  05/15/2018 FINDINGS: CT HEAD FINDINGS Brain: See There is atrophy and chronic small vessel disease changes. No acute intracranial abnormality. Specifically, no hemorrhage, hydrocephalus, mass lesion, acute infarction, or significant intracranial injury. Vascular: No hyperdense vessel or unexpected calcification. Skull: No acute calvarial abnormality. Other: None CT MAXILLOFACIAL FINDINGS Osseous: No fracture or  mandibular dislocation. No destructive process. Orbits: Negative. No traumatic or inflammatory finding. Sinuses: Kozel thickening in the maxillary sinuses bilaterally. No air-fluid levels. Soft tissues: Soft tissue swelling/laceration in the left face/orbital region. CT CERVICAL SPINE FINDINGS Alignment: 3 mm of anterolisthesis of C4 on C5 related to facet disease. Skull base and vertebrae: No acute fracture. No primary bone lesion or focal pathologic process. Soft tissues and spinal canal: No prevertebral fluid or swelling. No visible canal hematoma. Disc levels: Diffuse degenerative facet disease. Degenerative disc disease at C5-6 and C6-7. Is moderate disc herniation noted at C4-5. Upper chest: Biapical scarring.  No acute findings. Other: None IMPRESSION: Atrophy, chronic microvascular disease. No acute intracranial abnormality. Cervical spondylosis. Grade 1 anterolisthesis along with disc herniation noted at C4-5. No fracture in the cervical spine or face. Electronically Signed   By: Charlett Nose M.D.   On: 01/20/2019 19:06   Ct Cervical Spine Wo Contrast  Result Date: 01/20/2019 CLINICAL DATA:  Fall.  Head trauma, headache EXAM: CT HEAD WITHOUT CONTRAST CT MAXILLOFACIAL WITHOUT CONTRAST CT CERVICAL SPINE WITHOUT CONTRAST TECHNIQUE: Multidetector CT imaging of the head, cervical spine, and maxillofacial structures were performed using the standard protocol without intravenous contrast. Multiplanar CT image reconstructions of the cervical spine and maxillofacial structures were also generated. COMPARISON:  05/15/2018 FINDINGS: CT HEAD FINDINGS Brain: See There is atrophy and chronic small vessel disease changes. No acute intracranial abnormality. Specifically, no hemorrhage, hydrocephalus, mass lesion, acute infarction, or significant intracranial injury. Vascular: No hyperdense vessel or unexpected calcification. Skull: No acute calvarial abnormality. Other: None CT MAXILLOFACIAL FINDINGS Osseous: No  fracture or mandibular dislocation. No destructive process. Orbits: Negative. No traumatic or inflammatory finding. Sinuses: Kozel thickening in the maxillary sinuses bilaterally. No air-fluid levels. Soft tissues: Soft tissue swelling/laceration in the left face/orbital region. CT CERVICAL SPINE FINDINGS Alignment: 3 mm of anterolisthesis of C4 on C5 related to facet disease. Skull base and vertebrae: No acute fracture. No primary bone lesion or focal pathologic process. Soft tissues and spinal canal: No prevertebral fluid or swelling. No visible canal hematoma. Disc levels: Diffuse degenerative facet disease. Degenerative disc disease at C5-6 and C6-7. Is moderate disc herniation noted at C4-5. Upper chest: Biapical scarring.  No acute findings. Other: None IMPRESSION: Atrophy, chronic microvascular disease. No acute intracranial abnormality. Cervical spondylosis. Grade 1 anterolisthesis along with disc herniation noted at C4-5. No fracture in the cervical spine or face. Electronically Signed   By: Charlett Nose M.D.   On: 01/20/2019 19:06   Ct Hip Left Wo Contrast  Result Date: 01/20/2019 CLINICAL DATA:  Fall, left hip pain EXAM: CT OF THE LEFT HIP WITHOUT CONTRAST TECHNIQUE: Multidetector CT imaging of the left hip was performed according to the standard protocol. Multiplanar CT image reconstructions were also generated. COMPARISON:  Plain films earlier today FINDINGS: No  acute bony abnormality. No fracture. No subluxation or dislocation. Early joint space narrowing and spurring within the left hip joint. There is a large subcutaneous soft tissue hematoma overlying the upper lateral proximal femur measuring up to 7 cm in transverse diameter and approximately 10 cm in craniocaudal length. IMPRESSION: No acute bony abnormality. No proximal femoral fracture. Large soft tissue hematoma overlying the proximal left femur measuring 10 cm in greatest (craniocaudal) length. Electronically Signed   By: Charlett Nose M.D.    On: 01/20/2019 23:56   Dg Abd Portable 1v  Result Date: 01/23/2019 CLINICAL DATA:  Abdominal pain.  Constipation and diarrhea. EXAM: PORTABLE ABDOMEN - 1 VIEW COMPARISON:  12/27/2018 CT FINDINGS: Single supine view of the abdomen and pelvis. Convex right lumbar spine curvature. Gas within normal caliber large and small bowel. No abnormal abdominal calcifications. No appendicolith. No free intraperitoneal air. Incompletely imaged pacer. Right hemidiaphragm elevation. IMPRESSION: No acute findings. Electronically Signed   By: Jeronimo Greaves M.D.   On: 01/23/2019 19:45   Mm 3d Screen Breast Bilateral  Result Date: 12/31/2018 CLINICAL DATA:  Screening. EXAM: DIGITAL SCREENING BILATERAL MAMMOGRAM WITH TOMO AND CAD COMPARISON:  Previous exam(s). ACR Breast Density Category b: There are scattered areas of fibroglandular density. FINDINGS: There are no findings suspicious for malignancy. Images were processed with CAD. IMPRESSION: No mammographic evidence of malignancy. A result letter of this screening mammogram will be mailed directly to the patient. RECOMMENDATION: Screening mammogram in one year. (Code:SM-B-01Y) BI-RADS CATEGORY  1: Negative. Electronically Signed   By: Baird Lyons M.D.   On: 12/31/2018 12:40   Ct No Charge  Result Date: 01/24/2019 CLINICAL DATA:  Left hip pain after fall EXAM: CT ADDITIONAL VIEWS AT NO CHARGE TECHNIQUE: Reformatted images of the left hip from a concurrent CT of the abdomen and pelvis were submitted for interpretation. CONTRAST:  None. COMPARISON:  CT left hip 01/20/2019. FINDINGS: No acute fracture. Left hip joint alignment is maintained without dislocation. Similar mild degenerative changes of the left hip joint. Left SI joint is intact without diastasis. Mild left SI joint OA. Redemonstration of a large hyperdense collection within the posterolateral superficial soft tissues overlying the subtrochanteric aspect of the proximal left femur. Collection measures approximately  6.8 cm TR by 3.8 cm AP by 9.7 cm CC (series 8, image 46; series 7, image 51), previously measured 7.2 x 3.9 by 10.5 cm (01/20/2019). No new soft tissue fluid collections. There is moderate diffuse anasarca. IMPRESSION: 1. Minimal interval decrease in size of hyperdense collection posterolateral to the left hip, most likely representing evolving hematoma. 2. No fracture or malalignment of the left hip. Electronically Signed   By: Duanne Guess M.D.   On: 01/24/2019 13:08   Dg Femur Min 2 Views Left  Result Date: 01/20/2019 CLINICAL DATA:  Fall, left leg pain EXAM: LEFT FEMUR 2 VIEWS COMPARISON:  06/28/2014 FINDINGS: Prior left knee replacement. Small bone fragments are noted off the superior pole of the left patella on the lateral view, concerning for avulsed fracture fragments. No additional bony abnormality. IMPRESSION: Small bone fragments off the superior pole of the patella concerning for avulsed fracture fragments. Prior left knee replacement. Electronically Signed   By: Charlett Nose M.D.   On: 01/20/2019 19:20   Ct Maxillofacial Wo Contrast  Result Date: 01/20/2019 CLINICAL DATA:  Fall.  Head trauma, headache EXAM: CT HEAD WITHOUT CONTRAST CT MAXILLOFACIAL WITHOUT CONTRAST CT CERVICAL SPINE WITHOUT CONTRAST TECHNIQUE: Multidetector CT imaging of the head, cervical spine, and  maxillofacial structures were performed using the standard protocol without intravenous contrast. Multiplanar CT image reconstructions of the cervical spine and maxillofacial structures were also generated. COMPARISON:  05/15/2018 FINDINGS: CT HEAD FINDINGS Brain: See There is atrophy and chronic small vessel disease changes. No acute intracranial abnormality. Specifically, no hemorrhage, hydrocephalus, mass lesion, acute infarction, or significant intracranial injury. Vascular: No hyperdense vessel or unexpected calcification. Skull: No acute calvarial abnormality. Other: None CT MAXILLOFACIAL FINDINGS Osseous: No fracture or  mandibular dislocation. No destructive process. Orbits: Negative. No traumatic or inflammatory finding. Sinuses: Kozel thickening in the maxillary sinuses bilaterally. No air-fluid levels. Soft tissues: Soft tissue swelling/laceration in the left face/orbital region. CT CERVICAL SPINE FINDINGS Alignment: 3 mm of anterolisthesis of C4 on C5 related to facet disease. Skull base and vertebrae: No acute fracture. No primary bone lesion or focal pathologic process. Soft tissues and spinal canal: No prevertebral fluid or swelling. No visible canal hematoma. Disc levels: Diffuse degenerative facet disease. Degenerative disc disease at C5-6 and C6-7. Is moderate disc herniation noted at C4-5. Upper chest: Biapical scarring.  No acute findings. Other: None IMPRESSION: Atrophy, chronic microvascular disease. No acute intracranial abnormality. Cervical spondylosis. Grade 1 anterolisthesis along with disc herniation noted at C4-5. No fracture in the cervical spine or face. Electronically Signed   By: Rolm Baptise M.D.   On: 01/20/2019 19:06    Subjective: Examined at bedside and she states that she is feeling well.  Initially did not want to go to SNF but daughter convinced her to go to SNF.  No chest pain, lightheadedness or dizziness.  No nausea or vomiting.  No other concerns complaints at this time and feels that her hematoma is improving.  Daughter does not feel the patient is safe to go home as she lives alone so she will be going to a SNF as patient is agreeable.  Discharge Exam: Vitals:   01/26/19 0647 01/26/19 0917  BP: (!) 154/79 (!) 150/74  Pulse: 63 64  Resp: 17   Temp: 98.1 F (36.7 C)   SpO2: 91% 94%   Vitals:   01/25/19 1327 01/25/19 2137 01/26/19 0647 01/26/19 0917  BP: 139/76 (!) 156/85 (!) 154/79 (!) 150/74  Pulse: 60 62 63 64  Resp: 18 17 17    Temp: 97.8 F (36.6 C) 98.1 F (36.7 C) 98.1 F (36.7 C)   TempSrc: Axillary     SpO2: 94% 93% 91% 94%   General: Pt is alert, awake, not in  acute distress Cardiovascular: RRR, S1/S2 +, no rubs, no gallops Respiratory: Diminished bilaterally at the bases, no wheezing, no rhonchi; Unlabored Breathing  Abdominal: Soft, NT, Distended slightly, bowel sounds + Extremities: Large Ecchymosis and Bruising along with Hematoma on the Left Hip and  The results of significant diagnostics from this hospitalization (including imaging, microbiology, ancillary and laboratory) are listed below for reference.    Microbiology: Recent Results (from the past 240 hour(s))  Urine Culture     Status: Abnormal   Collection Time: 01/20/19  9:44 PM   Specimen: Urine, Random  Result Value Ref Range Status   Specimen Description URINE, RANDOM  Final   Special Requests   Final    NONE Performed at [facility], [address] [place], [place] [address]    Culture >=100,000 COLONIES/mL ESCHERICHIA COLI (A)  Final   Report Status 01/23/2019 FINAL  Final   Organism ID, Bacteria ESCHERICHIA COLI (A)  Final      Susceptibility   Escherichia coli -  MIC*    AMPICILLIN 8 SENSITIVE Sensitive     CEFAZOLIN <=4 SENSITIVE Sensitive     CEFTRIAXONE <=1 SENSITIVE Sensitive     CIPROFLOXACIN <=0.25 SENSITIVE Sensitive     GENTAMICIN <=1 SENSITIVE Sensitive     IMIPENEM <=0.25 SENSITIVE Sensitive     NITROFURANTOIN <=16 SENSITIVE Sensitive     TRIMETH/SULFA >=320 RESISTANT Resistant     AMPICILLIN/SULBACTAM 4 SENSITIVE Sensitive     PIP/TAZO <=4 SENSITIVE Sensitive     Extended ESBL NEGATIVE Sensitive     * >=100,000 COLONIES/mL ESCHERICHIA COLI  SARS Coronavirus 2 [facility], Performed in [facility] hospital lab) Nasopharyngeal Nasopharyngeal Swab     Status: None   Collection Time: 01/20/19 11:55 PM   Specimen: Nasopharyngeal Swab  Result Value Ref Range Status   SARS Coronavirus 2 NEGATIVE NEGATIVE Final    Comment: (NOTE) If result is NEGATIVE SARS-CoV-2 target nucleic acids are NOT DETECTED. The SARS-CoV-2 RNA is generally detectable in  upper and lower  respiratory specimens during the acute phase of infection. The lowest  concentration of SARS-CoV-2 viral copies this assay can detect is 250  copies / mL. A negative result does not preclude SARS-CoV-2 infection  and should not be used as the sole basis for treatment or other  patient management decisions.  A negative result may occur with  improper specimen collection / handling, submission of specimen other  than nasopharyngeal swab, presence of viral mutation(s) within the  areas targeted by this assay, and inadequate number of viral copies  (<250 copies / mL). A negative result must be combined with clinical  observations, patient history, and epidemiological information. If result is POSITIVE SARS-CoV-2 target nucleic acids are DETECTED. The SARS-CoV-2 RNA is generally detectable in upper and lower  respiratory specimens dur ing the acute phase of infection.  Positive  results are indicative of active infection with SARS-CoV-2.  Clinical  correlation with patient history and other diagnostic information is  necessary to determine patient infection status.  Positive results do  not rule out bacterial infection or co-infection with other viruses. If result is PRESUMPTIVE POSTIVE SARS-CoV-2 nucleic acids MAY BE PRESENT.   A presumptive positive result was obtained on the submitted specimen  and confirmed on repeat testing.  While 2019 novel coronavirus  (SARS-CoV-2) nucleic acids may be present in the submitted sample  additional confirmatory testing may be necessary for epidemiological  and / or clinical management purposes  to differentiate between  SARS-CoV-2 and other Sarbecovirus currently known to infect humans.  If clinically indicated additional testing with an alternate test  methodology [phone]) is advised. The SARS-CoV-2 RNA is generally  detectable in upper and lower respiratory sp ecimens during the acute  phase of infection. The expected result is  Negative. Fact Sheet for Patients:  BoilerBrush.com.cy Fact Sheet for Healthcare Providers: https://pope.com/ This test is not yet approved or cleared by the [place] FDA and has been authorized for detection and/or diagnosis of SARS-CoV-2 by FDA under an Emergency Use Authorization (EUA).  This EUA will remain in effect (meaning this test can be used) for the duration of the COVID-19 declaration under Section 564(b)(1) of the Act, 21 U.S.C. section 360bbb-3(b)(1), unless the authorization is terminated or revoked sooner. Performed at [facility], [address] [place], [place] [address]     Labs: BNP (last 3 results) No results for input(s): BNP in the last 8760 hours. Basic Metabolic Panel: Recent Labs  Lab 01/20/19 1738 01/21/19 0322 01/22/19  0800 01/24/19 1433 01/26/19 0759 01/26/19 0800  NA 130* 129* 129* 128* 131*  --   K 4.1 4.2 4.5 4.2 4.1  --   CL 98 97* 99 96* 100  --   CO2 23 25 23 25 24   --   GLUCOSE 98 99 87 134* 94  --   BUN 12 11 8  6* 6*  --   CREATININE 0.85 0.87 0.78 0.73 0.87  --   CALCIUM 9.0 8.9 8.4* 8.4* 8.9  --   MG  --   --   --   --   --  1.6*  PHOS  --   --   --   --   --  3.0   Liver Function Tests: No results for input(s): AST, ALT, ALKPHOS, BILITOT, PROT, ALBUMIN in the last 168 hours. No results for input(s): LIPASE, AMYLASE in the last 168 hours. No results for input(s): AMMONIA in the last 168 hours. CBC: Recent Labs  Lab 01/20/19 1738  01/22/19 1520 01/23/19 0450 01/23/19 1638 01/24/19 1213 01/26/19 0800  WBC 6.3   < > 6.0 4.6 6.1 6.5 5.9  NEUTROABS 4.5  --   --   --   --   --  3.9  HGB 10.1*   < > 8.3* 7.8* 9.1* 9.8* 9.6*  HCT 30.6*   < > 26.4* 23.8* 27.1* 29.6* 28.9*  MCV 94.7   < > 97.1 93.0 91.2 92.2 91.5  PLT 238   < > 173 175 226 253 314   < > = values in this interval not displayed.   Cardiac Enzymes: No results for input(s): CKTOTAL, CKMB, CKMBINDEX,  TROPONINI in the last 168 hours. BNP: Invalid input(s): POCBNP CBG: No results for input(s): GLUCAP in the last 168 hours. D-Dimer No results for input(s): DDIMER in the last 72 hours. Hgb A1c No results for input(s): HGBA1C in the last 72 hours. Lipid Profile No results for input(s): CHOL, HDL, LDLCALC, TRIG, CHOLHDL, LDLDIRECT in the last 72 hours. Thyroid function studies No results for input(s): TSH, T4TOTAL, T3FREE, THYROIDAB in the last 72 hours.  Invalid input(s): FREET3 Anemia work up No results for input(s): VITAMINB12, FOLATE, FERRITIN, TIBC, IRON, RETICCTPCT in the last 72 hours. Urinalysis    Component Value Date/Time   COLORURINE YELLOW 01/20/2019 2140   APPEARANCEUR CLOUDY (A) 01/20/2019 2140   LABSPEC 1.015 01/20/2019 2140   PHURINE 5.0 01/20/2019 2140   GLUCOSEU NEGATIVE 01/20/2019 2140   HGBUR NEGATIVE 01/20/2019 2140   BILIRUBINUR NEGATIVE 01/20/2019 2140   KETONESUR NEGATIVE 01/20/2019 2140   PROTEINUR NEGATIVE 01/20/2019 2140   UROBILINOGEN 0.2 04/24/2015 1423   NITRITE NEGATIVE 01/20/2019 2140   LEUKOCYTESUR SMALL (A) 01/20/2019 2140   Sepsis Labs Invalid input(s): PROCALCITONIN,  WBC,  LACTICIDVEN Microbiology Recent Results (from the past 240 hour(s))  Urine Culture     Status: Abnormal   Collection Time: 01/20/19  9:44 PM   Specimen: Urine, Random  Result Value Ref Range Status   Specimen Description URINE, RANDOM  Final   Special Requests   Final    NONE Performed at [facility], [address] [place], [place] [address]    Culture >=100,000 COLONIES/mL ESCHERICHIA COLI (A)  Final   Report Status 01/23/2019 FINAL  Final   Organism ID, Bacteria ESCHERICHIA COLI (A)  Final      Susceptibility   Escherichia coli - MIC*    AMPICILLIN 8 SENSITIVE Sensitive     CEFAZOLIN <=4 SENSITIVE Sensitive  CEFTRIAXONE <=1 SENSITIVE Sensitive     CIPROFLOXACIN <=0.25 SENSITIVE Sensitive     GENTAMICIN <=1 SENSITIVE Sensitive     IMIPENEM  <=0.25 SENSITIVE Sensitive     NITROFURANTOIN <=16 SENSITIVE Sensitive     TRIMETH/SULFA >=320 RESISTANT Resistant     AMPICILLIN/SULBACTAM 4 SENSITIVE Sensitive     PIP/TAZO <=4 SENSITIVE Sensitive     Extended ESBL NEGATIVE Sensitive     * >=100,000 COLONIES/mL ESCHERICHIA COLI  SARS Coronavirus 2 [facility], Performed in [facility] lab) Nasopharyngeal Nasopharyngeal Swab     Status: None   Collection Time: 01/20/19 11:55 PM   Specimen: Nasopharyngeal Swab  Result Value Ref Range Status   SARS Coronavirus 2 NEGATIVE NEGATIVE Final    Comment: (NOTE) If result is NEGATIVE SARS-CoV-2 target nucleic acids are NOT DETECTED. The SARS-CoV-2 RNA is generally detectable in upper and lower  respiratory specimens during the acute phase of infection. The lowest  concentration of SARS-CoV-2 viral copies this assay can detect is 250  copies / mL. A negative result does not preclude SARS-CoV-2 infection  and should not be used as the sole basis for treatment or other  patient management decisions.  A negative result may occur with  improper specimen collection / handling, submission of specimen other  than nasopharyngeal swab, presence of viral mutation(s) within the  areas targeted by this assay, and inadequate number of viral copies  (<250 copies / mL). A negative result must be combined with clinical  observations, patient history, and epidemiological information. If result is POSITIVE SARS-CoV-2 target nucleic acids are DETECTED. The SARS-CoV-2 RNA is generally detectable in upper and lower  respiratory specimens dur ing the acute phase of infection.  Positive  results are indicative of active infection with SARS-CoV-2.  Clinical  correlation with patient history and other diagnostic information is  necessary to determine patient infection status.  Positive results do  not rule out bacterial infection or co-infection with other viruses. If result is PRESUMPTIVE  POSTIVE SARS-CoV-2 nucleic acids MAY BE PRESENT.   A presumptive positive result was obtained on the submitted specimen  and confirmed on repeat testing.  While 2019 novel coronavirus  (SARS-CoV-2) nucleic acids may be present in the submitted sample  additional confirmatory testing may be necessary for epidemiological  and / or clinical management purposes  to differentiate between  SARS-CoV-2 and other Sarbecovirus currently known to infect humans.  If clinically indicated additional testing with an alternate test  methodology [phone]([id number]) is advised. The SARS-CoV-2 RNA is generally  detectable in upper and lower respiratory sp ecimens during the acute  phase of infection. The expected result is Negative. Fact Sheet for Patients:  BoilerBrush.com.cyhttps://www.fda.gov/media/136312/download Fact Sheet for Healthcare Providers: https://pope.com/https://www.fda.gov/media/136313/download This test is not yet approved or cleared by the [place] FDA and has been authorized for detection and/or diagnosis of SARS-CoV-2 by FDA under an Emergency Use Authorization (EUA).  This EUA will remain in effect (meaning this test can be used) for the duration of the COVID-19 declaration under Section 564(b)(1) of the Act, 21 U.S.C. section 360bbb-3(b)(1), unless the authorization is terminated or revoked sooner. Performed at [facility], [address] [place], [place] [address]    Time coordinating discharge: 35 minutes  SIGNED:  Merlene Laughtermair Latif Joden Bonsall, DO [facility] 01/26/2019, 12:22 PM Pager is on AMION  If 7PM-7AM, please contact night-coverage [email or web address] Password [email or web address]

## 2019-02-03 ENCOUNTER — Encounter: Payer: PRIVATE HEALTH INSURANCE | Admitting: Diagnostic Neuroimaging

## 2019-02-07 ENCOUNTER — Ambulatory Visit (INDEPENDENT_AMBULATORY_CARE_PROVIDER_SITE_OTHER): Payer: Medicare Other | Admitting: Cardiovascular Disease

## 2019-02-07 ENCOUNTER — Other Ambulatory Visit: Payer: Self-pay

## 2019-02-07 VITALS — BP 114/69 | HR 66 | Temp 97.7°F | Ht 65.0 in | Wt 117.0 lb

## 2019-02-07 DIAGNOSIS — Z7901 Long term (current) use of anticoagulants: Secondary | ICD-10-CM | POA: Diagnosis not present

## 2019-02-07 DIAGNOSIS — I480 Paroxysmal atrial fibrillation: Secondary | ICD-10-CM

## 2019-02-07 DIAGNOSIS — Z950 Presence of cardiac pacemaker: Secondary | ICD-10-CM

## 2019-02-07 DIAGNOSIS — I495 Sick sinus syndrome: Secondary | ICD-10-CM

## 2019-02-07 DIAGNOSIS — I10 Essential (primary) hypertension: Secondary | ICD-10-CM

## 2019-02-07 NOTE — Patient Instructions (Signed)
Medication Instructions:  STOP the Eliquis.  If you need a refill on your cardiac medications before your next appointment, please call your pharmacy.   Lab work: None ordered If you have labs (blood work) drawn today and your tests are completely normal, you will receive your results only by: Marland Kitchen MyChart Message (if you have MyChart) OR . A paper copy in the mail If you have any lab test that is abnormal or we need to change your treatment, we will call you to review the results.  Testing/Procedures: None ordered  Follow-Up: At [facility], you and your health needs are our priority.  As part of our continuing mission to provide you with exceptional heart care, we have created designated Provider Care Teams.  These Care Teams include your primary Cardiologist (physician) and Advanced Practice Providers (APPs -  Physician Assistants and Nurse Practitioners) who all work together to provide you with the care you need, when you need it. You will need a follow up appointment in 3 months. You may see Sanda Klein, MD or one of the following Advanced Practice Providers on your designated Care Team: [place], [place] . Fabian Sharp, PA-C

## 2019-02-07 NOTE — Progress Notes (Signed)
Cardiology Office Note    Date:  02/09/2019   ID:  NOU CHARD, DOB 15-Aug-1945, MRN [id number]  PCP:  Georgianne Fick, MD  Cardiologist:  Nicki Guadalajara, MD; Thurmon Fair, MD   Chief Complaint  Patient presents with  . Atrial Fibrillation  . Pacemaker Check    History of Present Illness:  HAZELGRACE BONHAM is a 73 y.o. female with paroxysmal atrial fibrillation and tachy-brady sd., here for pacemaker check.  She had a serious fall while alone at home.  She developed a very large left thigh hematoma and required transfusion of packed red blood cells.  She also had a gash in her head.  Fully, no serious or permanent injuries.  She is currently off anticoagulation.  The patient specifically denies any chest pain at rest exertion, dyspnea at rest or with exertion, orthopnea, paroxysmal nocturnal dyspnea, syncope, palpitations, focal neurological deficits, intermittent claudication, lower extremity edema, unexplained weight gain, cough, hemoptysis or wheezing.  She has lost substantial weight.  She is borderline underweight with a BMI of 19.  She has been quite sedentary since her fall.  Continues to live alone, but did have a brief stint in rehab.  She has not had any palpitations and her pacemaker has not recorded atrial fibrillation in over a year.  Performed a comprehensive check on her device today including manual testing of pacing thresholds.  Her Medtronic Revo device was implanted in 2012.  Generator voltage is 2.93 V (RRT 2.81 V), lead parameters remain excellent.  She has 77% atrial pacing with an appropriate heart rate histogram distribution for her degree of activity and she never requires ventricular pacing.    Past Medical History:  Diagnosis Date  . Anticoagulant long-term use  WITH PRADAXA 05/27/2011  . Anxiety   . Cellulitis of buttock, left 12/20/2014  . Complication of anesthesia    DIFFICULTY WAKING UP  . Depression   . Diverticulosis   . DJD (degenerative  joint disease)   . Esophagitis 09/17/06  . External hemorrhoids   . GERD (gastroesophageal reflux disease)   . Glaucoma   . History of stress test    a. 2008 - normal nuc.  Marland Kitchen Hypertension   . Hypothyroid   . IBS (irritable bowel syndrome)   . Paroxysmal A-fib [facility]) diagnosed 2008   a. On propafenone, Pradaxa.  . 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 BREAST REDUCTION SURGERY    . BUNIONECTOMY Right 07/09/2015   with rods and pins  . CARDIOVERSION N/A 08/02/2014   Procedure: CARDIOVERSION;  Surgeon: Quintella Reichert, MD;  Location: [facility];  Service: Cardiovascular;  Laterality: N/A;  . CATARACT EXTRACTION, BILATERAL Bilateral   . FACIAL COSMETIC SURGERY    . FOOT SURGERY Left   . HAND SURGERY     multiple  . LIPOSUCTION    . NM MYOCAR PERF WALL MOTION  12/29/2006   No significant ischemia; EF 69%  . OVARIAN CYST REMOVAL    . PACEMAKER INSERTION  04/08/11   MDT Revo implanted by Dr Rubie Maid  . TONSILLECTOMY    . TOTAL KNEE ARTHROPLASTY Left 05/01/2015   Procedure: TOTAL LEFT KNEE ARTHROPLASTY;  Surgeon: Durene Romans, MD;  Location: [facility] ORS;  Service: Orthopedics;  Laterality: Left;  Marland Kitchen VAGINAL HYSTERECTOMY      Current Medications: Outpatient Medications Prior  to Visit  Medication Sig Dispense Refill  . atenolol (TENORMIN) 25 MG tablet Take 1 tablet (25 mg total) by mouth 2 (two) times daily. 90 tablet 1  . dicyclomine (BENTYL) 10 MG capsule TAKE ONE CAPSULE 3 TIMES DAILY BEFORE MEALS (Patient taking differently: Take 10 mg by mouth 3 (three) times daily before meals. ) 90 capsule 1  . diltiazem (CARDIZEM CD) 120 MG 24 hr capsule TAKE ONE CAPSULE BY MOUTH EVERY NIGHT AT BEDTIME (Patient taking differently: Take 120 mg by mouth at bedtime. ) 90 capsule 3  . docusate sodium (COLACE) 100 MG capsule Take 1 capsule (100 mg  total) by mouth 2 (two) times daily. 10 capsule 0  . escitalopram (LEXAPRO) 20 MG tablet Take 10 mg by mouth daily.    Marland Kitchen. estradiol (ESTRACE) 0.5 MG tablet Take 0.5 mg by mouth daily.    . hydroxychloroquine (PLAQUENIL) 200 MG tablet Take 200 mg by mouth daily.    Marland Kitchen. levothyroxine (SYNTHROID, LEVOTHROID) 112 MCG tablet Take 112 mcg by mouth daily before breakfast.     . linaclotide (LINZESS) 72 MCG capsule Take 1 capsule (72 mcg total) by mouth daily before breakfast. 30 capsule 3  . metroNIDAZOLE (METROGEL) 1 % gel Apply 1 application topically daily.     . midodrine (PROAMATINE) 2.5 MG tablet Take 1 tablet (2.5 mg total) by mouth 3 (three) times daily with meals. 180 tablet 3  . Nitroglycerin 0.4 % OINT Place 1 application rectally 3 (three) times daily.    . polyethylene glycol (MIRALAX / GLYCOLAX) 17 g packet Take 17 g by mouth daily. 14 each 0  . propafenone (RYTHMOL) 150 MG tablet TAKE ONE AND ONE-HALF TABLETS BY MOUTH EVERY MORNING, ONE TABLET BY MOUTH MID-DAY, AND ONE AND ONE-HALF TABLETS AT BEDTIME 360 tablet 1  . traZODone (DESYREL) 150 MG tablet Cut the half a tablet in half to make (37.5 mg) (Patient taking differently: Take 37.5 mg by mouth at bedtime. ) 30 tablet 1  . triamcinolone cream (KENALOG) 0.1 % Apply 1 application topically 2 (two) times daily. To face    . apixaban (ELIQUIS) 5 MG TABS tablet Take 1 tablet (5 mg total) by mouth 2 (two) times daily. WILL NEED CARDIOLOGY EVALUATION PRIOR TO RESUMING    . HYDROcodone-acetaminophen (NORCO) 7.5-325 MG tablet Take 1 tablet by mouth every 6 (six) hours as needed for moderate pain. 10 tablet 0  . losartan (COZAAR) 25 MG tablet Take 25 mg by mouth 2 (two) times a day.    Marland Kitchen. HYDROcodone-acetaminophen (NORCO/VICODIN) 5-325 MG tablet Take 1 tablet by mouth every 6 (six) hours as needed.     No facility-administered medications prior to visit.      Allergies:   Azithromycin, Epinephrine, Penicillins, Lidocaine, and Metoprolol   Social  History   Socioeconomic History  . Marital status: Divorced    Spouse name: Not on file  . Number of children: 3  . Years of education: Not on file  . Highest education level: Not on file  Occupational History  . Occupation: retired    Associate Professormployer: RETIRED  Social Needs  . Financial resource strain: Not on file  . Food insecurity    Worry: Not on file    Inability: Not on file  . Transportation needs    Medical: Not on file    Non-medical: Not on file  Tobacco Use  . Smoking status: Former Smoker    Quit date: 08/30/1996    Years since quitting: 22.4  .  Smokeless tobacco: Never Used  Substance and Sexual Activity  . Alcohol use: Yes    Alcohol/week: 6.0 standard drinks    Types: 6 Glasses of wine per week    Comment: daily  . Drug use: No  . Sexual activity: Yes  Lifestyle  . Physical activity    Days per week: Not on file    Minutes per session: Not on file  . Stress: Not on file  Relationships  . Social Herbalist on phone: Not on file    Gets together: Not on file    Attends religious service: Not on file    Active member of club or organization: Not on file    Attends meetings of clubs or organizations: Not on file    Relationship status: Not on file  Other Topics Concern  . Not on file  Social History Narrative   Lives in [place],  Former [employer],  Separated.       Family History:  The patient's family history includes Anesthesia problems in her mother; Colon polyps in her mother.   ROS:   Please see the history of present illness.    All other systems are reviewed and are negative  PHYSICAL EXAM:   VS:  BP 114/69   Pulse 66   Temp 97.7 F (36.5 C)   Ht 5\' 5"  (1.651 m)   Wt 117 lb (53.1 kg)   SpO2 (!) 66%   BMI 19.47 kg/m      General: Alert, oriented x3, no distress, looks very slender.  Appears a little frail.  Slightly pale.  Healthy pacemaker site left subclavian area. Head: no evidence of trauma, PERRL, EOMI, no  exophtalmos or lid lag, no myxedema, no xanthelasma; normal ears, nose and oropharynx Neck: normal jugular venous pulsations and no hepatojugular reflux; brisk carotid pulses without delay and no carotid bruits Chest: clear to auscultation, no signs of consolidation by percussion or palpation, normal fremitus, symmetrical and full respiratory excursions Cardiovascular: normal position and quality of the apical impulse, regular rhythm, normal first and second heart sounds, no murmurs, rubs or gallops Abdomen: no tenderness or distention, no masses by palpation, no abnormal pulsatility or arterial bruits, normal bowel sounds, no hepatosplenomegaly Extremities: no clubbing, cyanosis or edema; 2+ radial, ulnar and brachial pulses bilaterally; 2+ right femoral, posterior tibial and dorsalis pedis pulses; 2+ left femoral, posterior tibial and dorsalis pedis pulses; no subclavian or femoral bruits.  She has a moderately firm hematoma overlying the lateral aspect of her left hip, roughly 12 cm long Neurological: grossly nonfocal Psych: Normal mood and affect    Wt Readings from Last 3 Encounters:  02/07/19 117 lb (53.1 kg)  12/30/18 119 lb (54 kg)  11/09/18 125 lb (56.7 kg)      Studies/Labs Reviewed:   EKG:  EKG is not ordered today.  ECG from 01/22/2019 shows sinus rhythm, long PR interval, delayed R wave progression, no ischemic changes.  Recent Labs: 01/26/2019: BUN 6; Creatinine, Ser 0.87; Hemoglobin 9.6; Magnesium 1.6; Platelets 314; Potassium 4.1; Sodium 131   Lipid Panel    Component Value Date/Time   CHOL  12/19/2006 0315    172        ATP III CLASSIFICATION:  <200     mg/dL   Desirable  200-239  mg/dL   Borderline High  >=240    mg/dL   High   TRIG 293 (H) 12/19/2006 0315   HDL 37 (L) 12/19/2006 0315  CHOLHDL 4.6 12/19/2006 0315   VLDL 59 (H) 12/19/2006 0315   LDLCALC  12/19/2006 0315    76        Total Cholesterol/HDL:CHD Risk Coronary Heart Disease Risk Table                      Men   Women  1/2 Average Risk   3.4   3.3    Additional studies/ records that were reviewed today include:  Notes from recent hospitalization.  ASSESSMENT:    1. Paroxysmal atrial fibrillation (HCC)   2. On anticoagulant therapy   3. SSS (sick sinus syndrome) (HCC)   4. Pacemaker   5. Essential hypertension      PLAN:  In order of problems listed above:  1. AFib: Excellent response to propafenone, without any evidence of toxicity.  No atrial fibrillation in over a year. CHADSVasc 3 without history of stroke or TIA.  Recent severe bleeding problems after fall.. 2. Eliquis: At this point I believe the risks of continued anticoagulation appears to exceed the benefit.  I would recommend that she remain off anticoagulation until and unless we detect further episodes of atrial fibrillation and we are sure that she is not having more frequent falls or other bleeding incidents.  Will perform in office monitoring of her pacemaker every 3 months to look for atrial fibrillation. 3. SSS: After her fall she has been very sedentary and is hard to say whether the current sensor settings are appropriate.  We will wait until she becomes more active to make any additional adjustments. 4. PPM: Normal device function.  She does not like to perform remote checks.  We will see her in the office every 3 months. 5. HTN: Excellent control.  Medication Adjustments/Labs and Tests Ordered: Current medicines are reviewed at length with the patient today.  Concerns regarding medicines are outlined above.  Medication changes, Labs and Tests ordered today are listed in the Patient Instructions below. Patient Instructions  Medication Instructions:  STOP the Eliquis.  If you need a refill on your cardiac medications before your next appointment, please call your pharmacy.   Lab work: None ordered If you have labs (blood work) drawn today and your tests are completely normal, you will receive your results  only by: Marland Kitchen. MyChart Message (if you have MyChart) OR . A paper copy in the mail If you have any lab test that is abnormal or we need to change your treatment, we will call you to review the results.  Testing/Procedures: None ordered  Follow-Up: At [facility], you and your health needs are our priority.  As part of our continuing mission to provide you with exceptional heart care, we have created designated Provider Care Teams.  These Care Teams include your primary Cardiologist (physician) and Advanced Practice Providers (APPs -  Physician Assistants and Nurse Practitioners) who all work together to provide you with the care you need, when you need it. You will need a follow up appointment in 3 months. You may see Thurmon FairMihai Hernandez [place], MD or one of the following Advanced Practice Providers on your designated Care Team: LeeHao Meng, [place]PA-C . Micah FlesherAngela Duke, PA-C       Signed, Thurmon FairMihai Jerrol Helmers, MD  02/09/2019 9:17 AM    [facility] [address], [place], [place]  [address] Phone: [phone]; Fax: [phone]

## 2019-02-09 ENCOUNTER — Other Ambulatory Visit: Payer: Self-pay | Admitting: *Deleted

## 2019-02-09 NOTE — Patient Outreach (Signed)
Member assessed for potential [facility] Care Management needs as a benefit of [place] [employer].  Made aware by [facility] UM team that Ms. Jacqueline Kim discharged from [facility] SNF to home with hhc on 02/03/19 per member's request. Per chart review, Jacqueline Kim has supportive daughters.  Member lives alone. She has a history of HTN, paroxysmal A-fib, anxiety. Her recent SNF stay was s/p hospitalization due to fall, left thigh hematoma and left periorbital laceration.  Telephone call made to Jacqueline Kim at [phone] to discuss [place] Management program. Jacqueline Kim pleasantly declines. States she is doing well and has good follow up with her doctors. Denies having any chronic disease management needs, medication, or transportation needs. States " I have two daughters who are here all of the time and they help me." She also endorses she has home health.  Jacqueline Kim pleasantly declined [place] Management follow up.  Will sign off as [facility] Care Management services were declined at this time.   Marthenia Rolling, MSN-Ed, RN,BSN [place] Acute Care Coordinator [phone] [facility]) [phone]  (Toll free office)

## 2019-02-15 ENCOUNTER — Other Ambulatory Visit: Payer: Self-pay | Admitting: Cardiovascular Disease

## 2019-02-19 ENCOUNTER — Other Ambulatory Visit: Payer: Self-pay | Admitting: Cardiovascular Disease

## 2019-03-17 ENCOUNTER — Encounter: Payer: PRIVATE HEALTH INSURANCE | Admitting: Diagnostic Neuroimaging

## 2019-07-01 ENCOUNTER — Ambulatory Visit: Payer: Medicare Other | Attending: Internal Medicine

## 2019-07-01 DIAGNOSIS — Z23 Encounter for immunization: Secondary | ICD-10-CM | POA: Insufficient documentation

## 2019-07-01 NOTE — Progress Notes (Signed)
   Covid-19 Vaccination Clinic  Name:  Jacqueline Kim    MRN: [id number] DOB: 07-21-1945  07/01/2019  [name] was observed post Covid-19 immunization for 30 minutes based on pre-vaccination screening without incidence. She was provided with Vaccine Information Sheet and instruction to access the V-Safe system.   [name] was instructed to call 911 with any severe reactions post vaccine: Marland Kitchen Difficulty breathing  . Swelling of your face and throat  . A fast heartbeat  . A bad rash all over your body  . Dizziness and weakness    Immunizations Administered    Name Date Dose VIS Date Route   Pfizer COVID-19 Vaccine 07/01/2019  9:35 AM 0.3 mL 05/27/2019 Intramuscular   Manufacturer: [employer], [employer]   Lot: [id number]   NDC: [address]-1

## 2019-07-08 ENCOUNTER — Institutional Professional Consult (permissible substitution): Payer: Medicare Other | Admitting: Plastic Surgery

## 2019-07-13 ENCOUNTER — Ambulatory Visit (INDEPENDENT_AMBULATORY_CARE_PROVIDER_SITE_OTHER): Payer: Medicare Other | Admitting: Cardiovascular Disease

## 2019-07-13 ENCOUNTER — Encounter: Payer: Self-pay | Admitting: Cardiovascular Disease

## 2019-07-13 ENCOUNTER — Other Ambulatory Visit: Payer: Self-pay

## 2019-07-13 DIAGNOSIS — Z950 Presence of cardiac pacemaker: Secondary | ICD-10-CM | POA: Diagnosis not present

## 2019-07-13 DIAGNOSIS — I495 Sick sinus syndrome: Secondary | ICD-10-CM

## 2019-07-13 DIAGNOSIS — E039 Hypothyroidism, unspecified: Secondary | ICD-10-CM

## 2019-07-13 DIAGNOSIS — I480 Paroxysmal atrial fibrillation: Secondary | ICD-10-CM | POA: Diagnosis not present

## 2019-07-13 DIAGNOSIS — I10 Essential (primary) hypertension: Secondary | ICD-10-CM | POA: Diagnosis not present

## 2019-07-13 DIAGNOSIS — G4733 Obstructive sleep apnea (adult) (pediatric): Secondary | ICD-10-CM

## 2019-07-13 NOTE — Patient Instructions (Signed)
Medication Instructions:  BEGIN TO DECREASE MIDODRINE. TAKE 1 TABLET IN THE MORNING FOR 7 DAYS THEN STOP TAKING IF BLOOD PRESSURES ARE STABLE. *If you need a refill on your cardiac medications before your next appointment, please call your pharmacy*  Follow-Up: At [facility], you and your health needs are our priority.  As part of our continuing mission to provide you with exceptional heart care, we have created designated Provider Care Teams.  These Care Teams include your primary Cardiologist (physician) and Advanced Practice Providers (APPs -  Physician Assistants and Nurse Practitioners) who all work together to provide you with the care you need, when you need it.  Your next appointment:   2 month(s)  The format for your next appointment:   In Person  Provider:   Nicki Guadalajara, MD

## 2019-07-14 NOTE — Progress Notes (Signed)
Patient ID: Jacqueline Kim, female   DOB: [birth year], 74 y.o.   MRN: [id number]     HPI: Jacqueline Kim is a 74 y.o. female  who presents for a 6 month follow-up cardiologic evaluation.  Jacqueline Kim has known history of sinus node dysfunction. She has history of paroxysmal atrial fibrillation. She is status post permanent pacemaker implanted on 04/08/2011 Medtronic Revo MRI compatible pacemaker. She also has a history of hypothyroidism. She has had recurrent episodes of paroxysmal atrial fibrillation and has been on pradaxa anticoagulation for several years after initially having been on warfarin.  She has been maintained on rhythmol SR 325 mg twice a day and atenolol 50 mg daily.  She also is on losartan 50 mg for hypertension.  She was hospitalized in January 2014 with recurrent AF in the setting of significant increased family stress. She is now separated and living in a different household from her husband [name].  She has had several episodes of recurrent AF and had an episode on 03/12/2013 which lasted for approximately 12 hours.  On the Saturday before Easter 2015 when she was rushing with family members and trying to prepare dinner she was hospitalized and presented with a heart rate at 150.  She was started on IV Cardizem for rate control.  She spontaneously cardioverted to sinus rhythm on April 6 prior to a planned TEE cardioversion.  I saw her in followup of that hospitalization and suggested an EP evaluation.  She saw [name] for discussion concerning atrial fibrillation ablation.  Apparently, at that time, the patient was feeling well. She opted against pursuing AF ablation presently.    A followup echo Doppler study on 10/24/2013 showed an ejection fraction of 60-65% and there was evidence for grade 1 diastolic function without wall motion abnormalities.  Her left atrial dimension was normal.    She is followed by [name] for her pacemaker.  She was hospitalized in early  February 2016 following a prolonged episode of atrial fibrillation with rapid ventricular response for which she was symptomatic.  She underwent her initial cardioversion on 07/27/2014.  When she saw [name] on August 08, 2014 interrogation of her pacemaker showed a 28 hour episode of atrial fibrillation without recurrence since the cardioversion.  She again developed a recurrent episode of atrial fibrillation on 08/30/2014 which led to an emergency room evaluation.  I last saw her in March.  I recommended follow-up evaluation with [name] and reconsideration of possible atrial fibrillation ablation.  She saw [name] at that time admitted that she was drinking wine on a daily basis.  Rather than pursue ablation she elected to reduce her EtOH intake.  She feels that she has done well and has felt one episode of palpitations during her period of increased stress that she is aware of since her last evaluation..  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  She denies exertional chest pain, shortness of breath, orthopnea, PND, presyncope or syncope or any neurologic sequelae.  When she met with [name].  There was no discussion of changing her anti-rhythmic therapy.    She underwent left knee surgery by [name].  She also fell on December 20, 2014 in the shower and was hospitalized for 2 days.  She developed a cellulitis and buttock abscess, which required anabiotic therapy.    She  lives at [facility].  She denies any episodes of  chest pain.  At times she notes some shortness of breath with activity.  She saw [name] in October 2017 and her pacemaker was functioning normally.  She did not have any recurrent atrial fibrillation and had only 2 brief episodes of PAT which combined last a total of 54 seconds.  She states she had stopped taking the losartan, which had been 50 mg because that time, she noted some lightheadedness unassociated with any  rhythm disturbance.  In August 2017, a CT of her abdomen was performed which did not explain some of her issues at the time of rectal bleeding and constipation.  She was found to have mild colonic diverticulosis without diverticulitis.  Incidentally, she was noted to have abdominal aortic atherosclerosis.    When I saw her in 2018, she was hypertensive and I recommended reinstitution of losartan at least 25 mg depending upon her blood pressure, but if necessary to increase to 25 mg 3 times a day.  Since I last saw her in May 2018, she underwent an echo Doppler study which showed an EF of 55-60%.  She had normal diastolic parameters.  There was mild mitral regurgitation, mild left atrial dilation, and mild pulmonary hypertension with a PA peak pressure 38 mm.  She saw [name] on 11/13/2016 and her losartan dose was reduced to 25 mg daily.  She sees Dr. Naida Sleight for rheumatoid arthritis.  She is status post left knee replacement and tolerated this well.  Her primary physician is [name].    She has had issues with scoliosis and in the future may require possible surgery.  At times she has noticed some dragging of her left leg.  She is unaware of any recurrent episodes of atrial fibrillation.  Her last echo Doppler study in May 2018 showed an EF of 55-60%.  She had mild mitral regurgitation.  There was mild pulmonary hypertension with a PA pressure 38.  She had normal diastolic parameters.   She saw [name] on February 12, 2018.  At that time, interrogation of her pacemaker revealed one 3-minute episode in the last 6 months of recurrent PAF occurred on Oct 28, 2017.  However, she comes to the office today and states that last week sleeping she was awakened with a chest fluttering and was concerned that she was back in atrial fibrillation for questionable duration.   I  saw her in October 2019 and at that time she was she  there with her daughter.  She appeared much more frail.  Due to chronic back  discomfort, she was walking with assistance.  She had moved to a lower ground apartment from her second floor unit at [facility]. She  had issues with dizziness and has fallen on a couple occasions.  She was on atenolol 25 mg twice a day, propofenone150 mg tablets  1-1/2 in the morning, 1 at midday, 1-1/2 in the evening.  She was on Cardizem CD 120 mg at bedtime.  She was on Eliquis for anticoagulation.  According to the family members she was noted to have significant snoring during sleep and was often waking up in the middle the night with possible gasping for air.  She denies any recent bruises particularly following her fall.  She is on levothyroxine for hypothyroidism.  During that evaluation, she was orthostatic.  She was 100% atrially placed.  I reduced her atenolol down to 12.5 mg.  I also recommended that she reduce her Lexapro and recommended 20 to 30 mm compression  stockings.  Due to continued episodes her atenolol was ultimately discontinued altogether.  I scheduled her for a sleep study to assess for sleep apnea.  Her sleep study was done on May 08, 2018 and was positive for sleep apnea with an overall AHI at 13.4/h.  Events were worse and sleep apnea was moderate during REM sleep with an AHI of 22.5/h.  She had significant oxygen desaturation to 80%.    When I last saw her in the office on June 28, 2018 she had not yet had her CPAP titration. However, during that evaluation she appeared exceedingly frail. She had significant orthostatic hypotension and I significantly reduced her medications. I had reduced her trazodone initially from 150 mg down to 75 mg and then subsequently decrease this to 37.5 mg. In addition I completely discontinued her furosemide. She again is now wearing support stockings and she denies any orthostatic symptoms. She underwent her CPAP titration trial on June 30, 2018 and CPAP was titrated up to 14 cm of water with still events. It was recommended that  she initiate CPAP auto therapy with a minimum of 14 up to 20 cm water pressure if needed.  I evaluated her by a telemedicine visit in April 2020.  Her CPAP set up date was August 10, 2018 with [facility]. I was able to obtain a download of her use. She apparently only initially used it for short-term and of the first month only 9 out of 30 days of use with an average use at 1 hour. Her 95th percentile pressure with minimal use was 14.3 cm and her AHI remained 4.4/h. She felt that the mask was causing her to have significant neck difficulties. As result, she has not used CPAP since. At the time of that evaluation she was unaware of episodes of dizziness. She does not have a blood pressure monitor at home and cannot take her blood pressure but she feels that she is able to go from sitting to standing position without getting dizzy. She wasunaware of any recurrent episodes of obvious atrial fibrillation. She denies chest pain. She denies bleeding on anticoagulation.  She was reevaluated in a telemedicine visit in July 2020.  At that time she continued  continued to have issues with low blood pressure.  Apparently she was evaluated by Dr. Rexene Alberts for her sensory polyneuropathy and lower extremity weakness.  He will be undergoing EMG and nerve conduction studies.  There was concerned that she was drinking wine in excess.  Since I last evaluated her, she has not been using CPAP.  She denies any awareness of recurrent atrial fibrillation.  Her blood pressure has tended to be low.    She was evaluated by [name] in all 2020 for pacemaker evaluation.  There were no recurrent episodes of atrial fibrillation on propafenone.  It was felt that her risks of anticoagulation exceeded the potential benefit particularly she was maintaining sinus rhythm.  Normal pacemaker device function.  Her blood pressure was controlled at that time.  Since I last evaluated her she has significantly Kim up wine.   She has had issues with right leg pruritus.  She is unaware of recurrent atrial fibrillation or chest pain.  Her CPAP machine had been returned.  She is now taking midodrine 2.5 mg twice a day, and diltiazem 120 mg at bedtime.  She is no longer taking atenolol but has a prescription and has this as a as needed basis.  She continues to be on propafenone.  She is on trazodone 37.5 mg daily.  She is here in the office today with her daughter.     Past Medical History:  Diagnosis Date  . Anticoagulant long-term use  WITH PRADAXA 05/27/2011  . Anxiety   . Cellulitis of buttock, left 12/20/2014  . Complication of anesthesia    DIFFICULTY WAKING UP  . Depression   . Diverticulosis   . DJD (degenerative joint disease)   . Esophagitis 09/17/06  . External hemorrhoids   . GERD (gastroesophageal reflux disease)   . Glaucoma   . History of stress test    a. 2008 - normal nuc.  Marland Kitchen Hypertension   . Hypothyroid   . IBS (irritable bowel syndrome)   . Paroxysmal A-fib [facility]) diagnosed 2008   a. On propafenone, Pradaxa.  . 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 BREAST REDUCTION SURGERY    . BUNIONECTOMY Right 07/09/2015   with rods and pins  . CARDIOVERSION N/A 08/02/2014   Procedure: CARDIOVERSION;  Surgeon: Sueanne Margarita, MD;  Location: [place];  Service: Cardiovascular;  Laterality: N/A;  . CATARACT EXTRACTION, BILATERAL Bilateral   . FACIAL COSMETIC SURGERY    . FOOT SURGERY Left   . HAND SURGERY     multiple  . LIPOSUCTION    . NM MYOCAR PERF WALL MOTION  12/29/2006   No significant ischemia; EF 69%  . OVARIAN CYST REMOVAL    . PACEMAKER INSERTION  04/08/11   MDT Revo implanted by Dr Recardo Evangelist  . TONSILLECTOMY    . TOTAL KNEE ARTHROPLASTY Left 05/01/2015   Procedure: TOTAL LEFT KNEE ARTHROPLASTY;  Surgeon: Paralee Cancel, MD;   Location: [facility];  Service: Orthopedics;  Laterality: Left;  Marland Kitchen VAGINAL HYSTERECTOMY      Allergies  Allergen Reactions  . Azithromycin Other (See Comments)    Causes heart rhythm issues Other reaction(s): Other (See Comments) Causes heart rhythm issues  . Epinephrine Other (See Comments)    Causes A-Fib Other reaction(s): Increased Heart Rate (intolerance) States that it caused her heart to beat fast, so she will not take it. Causes A-Fib A-fib Triggers atrial fib   . Penicillins Anaphylaxis    Has patient had a PCN reaction causing immediate rash, facial/tongue/throat swelling, SOB or lightheadedness with hypotension: Yes Has patient had a PCN reaction causing severe rash involving mucus membranes or skin necrosis: No Has patient had a PCN reaction that required hospitalization No Has patient had a PCN reaction occurring within the last 10 years: No If all of the above answers are "NO", then may proceed with Cephalosporin use.   . Lidocaine Palpitations    Or any similar medication Other reaction(s): Other (See Comments) Or any similar medication  . Metoprolol Other (See Comments)    alopecia Other reaction(s): Other (See Comments) alopecia    Current Outpatient Medications  Medication Sig Dispense Refill  . atenolol (TENORMIN) 25 MG tablet Take 1 tablet (25 mg total) by mouth 2 (two) times daily. 90 tablet 1  . dicyclomine (BENTYL) 10 MG capsule TAKE ONE CAPSULE 3 TIMES DAILY BEFORE MEALS (Patient taking differently: Take 10 mg by mouth 3 (three) times daily before meals. ) 90 capsule 1  . diltiazem (CARDIZEM CD) 120 MG 24 hr capsule TAKE ONE CAPSULE BY MOUTH EVERY NIGHT AT BEDTIME (Patient taking differently: Take 120 mg  by mouth at bedtime. ) 90 capsule 3  . escitalopram (LEXAPRO) 20 MG tablet Take 10 mg by mouth daily.    Marland Kitchen estradiol (ESTRACE) 0.5 MG tablet Take 0.5 mg by mouth daily.    Marland Kitchen HYDROcodone-acetaminophen (NORCO/VICODIN) 5-325 MG tablet Take 1 tablet by mouth  every 6 (six) hours as needed.    . hydroxychloroquine (PLAQUENIL) 200 MG tablet Take 200 mg by mouth daily.    Marland Kitchen levothyroxine (SYNTHROID, LEVOTHROID) 112 MCG tablet Take 112 mcg by mouth daily before breakfast.     . linaclotide (LINZESS) 72 MCG capsule Take 1 capsule (72 mcg total) by mouth daily before breakfast. 30 capsule 3  . propafenone (RYTHMOL) 150 MG tablet TAKE ONE AND ONE-HALF TABLETS BY MOUTH EVERY MORNING, ONE TABLET BY MOUTH MID-DAY, AND ONE AND ONE-HALF TABLETS AT BEDTIME 360 tablet 1  . traZODone (DESYREL) 150 MG tablet Cut the half a tablet in half to make (37.5 mg) (Patient taking differently: Take 37.5 mg by mouth at bedtime. ) 30 tablet 1   No current facility-administered medications for this visit.    Social History   Socioeconomic History  . Marital status: Divorced    Spouse name: Not on file  . Number of children: 3  . Years of education: Not on file  . Highest education level: Not on file  Occupational History  . Occupation: retired    Fish farm manager: RETIRED  Tobacco Use  . Smoking status: Former Smoker    Quit date: 08/30/1996    Years since quitting: 22.8  . Smokeless tobacco: Never Used  Substance and Sexual Activity  . Alcohol use: Yes    Alcohol/week: 6.0 standard drinks    Types: 6 Glasses of wine per week    Comment: daily  . Drug use: No  . Sexual activity: Yes  Other Topics Concern  . Not on file  Social History Narrative   Lives in [place],  Former [place] [employer],  Separated.     Social Determinants of Health   Financial Resource Strain:   . Difficulty of Paying Living Expenses: Not on file  Food Insecurity:   . Worried About Charity fundraiser in the Last Year: Not on file  . Ran Out of Food in the Last Year: Not on file  Transportation Needs:   . Lack of Transportation (Medical): Not on file  . Lack of Transportation (Non-Medical): Not on file  Physical Activity:   . Days of Exercise per Week: Not on file  . Minutes of  Exercise per Session: Not on file  Stress:   . Feeling of Stress : Not on file  Social Connections:   . Frequency of Communication with Friends and Family: Not on file  . Frequency of Social Gatherings with Friends and Family: Not on file  . Attends Religious Services: Not on file  . Active Member of Clubs or Organizations: Not on file  . Attends Archivist Meetings: Not on file  . Marital Status: Not on file  Intimate Partner Violence:   . Fear of Current or Ex-Partner: Not on file  . Emotionally Abused: Not on file  . Physically Abused: Not on file  . Sexually Abused: Not on file    Family History  Problem Relation Age of Onset  . Anesthesia problems Mother   . Colon polyps Mother        beign  . Colon cancer Neg Hx    ROS General: Negative; No fevers, chills, or night  sweats;  HEENT: Negative; No changes in vision or hearing, sinus congestion, difficulty swallowing Pulmonary: Negative; No cough, wheezing, shortness of breath, hemoptysis Cardiovascular:  See HPI GI: Positive for irritable bowel syndrome for which she takes Bentyl before meals;  GU: Negative; No dysuria, hematuria, or difficulty voiding Musculoskeletal: Positive for rheumatoid arthritis, and scoliosis. Hematologic/Oncology: Negative; no easy bruising, bleeding Endocrine: Negative; no heat/cold intolerance; no diabetes Neuro: Negative; no changes in balance, headaches Skin: Negative; No rashes or skin lesions Psychiatric: There is less stress and anxiety; No behavioral problems, depression Sleep: Positive for recently diagnosed OSA.  Stop using CPAP therapy.  PE BP (!) 142/75   Pulse 61   Temp (!) 97.5 F (36.4 C)   Ht '5\' 5"'$  (1.651 m)   Wt 123 lb (55.8 kg)   SpO2 97%   BMI 20.47 kg/m    Previous hypotension and significant orthostasis had resolved with medication adjustment. Blood pressure on repeat by me was 130/75 supine and 142/74 standing  Wt Readings from Last 3 Encounters:    07/13/19 123 lb (55.8 kg)  02/07/19 117 lb (53.1 kg)  12/30/18 119 lb (54 kg)   General: Alert, oriented, no distress.  Skin: normal turgor, no rashes, warm and dry HEENT: Normocephalic, atraumatic. Pupils equal round and reactive to light; sclera anicteric; extraocular muscles intact;  Nose without nasal septal hypertrophy Mouth/Parynx benign; Mallinpatti scale 3 Neck: No JVD, no carotid bruits; normal carotid upstroke Lungs: clear to ausculatation and percussion; no wheezing or rales Chest wall: without tenderness to palpitation Heart: PMI not displaced, RRR, s1 s2 normal, 1/6 systolic murmur, no diastolic murmur, no rubs, gallops, thrills, or heaves Abdomen: soft, nontender; no hepatosplenomehaly, BS+; abdominal aorta nontender and not dilated by palpation. Back: no CVA tenderness Pulses 2+ Musculoskeletal: full range of motion, normal strength, no joint deformities Extremities: no clubbing cyanosis or edema, Homan's sign negative  Neurologic: grossly nonfocal; Cranial nerves grossly wnl Psychologic: Normal mood and affect   ECG (independently read by me): Atrially paced rhythm at 61 bpm.  Prolonged AV conduction with PR interval to 88 ms.  No ectopy  January 2020 ECG (independently read by me): Lee paced rhythm with prolonged AV conduction.  Ventricular rate 65 bpm.  PR interval 248 ms.  No ectopy.  October 2019 ECG (independently read by me): Telemetry paced rhythm with prolonged AV conduction with PR interval 264 ms.  Ventricular rate 64 bpm.  QTc interval 478 ms.  May 2019 ECG (independently read by me): Atrially paced rhythm at 62 bpm with prolonged AV conduction.  PR interval '2 9 2 '$ ms.  QTc interval '4 8 1 '$ ms.  November 2018 ECG (independently read by me): atrially paced rhythm with prolonged AV conduction with PR interval at 264 ms.  PAC.  QTc interval increase that 503 ms.  May 2018 ECG (independently read by me): Atrially paced rhythm at 69 bpm.  Prolonged AV conduction  with PR interval 266 milliseconds.  Increased QTc interval 497 ms.  August 2016 ECG (independently read by me): Atrially paced rhythm at 66 bpm.  PR interval 232 ms.  QTc interval normal at 448 ms.  ECG (independently read by me): Atrially paced rhythm at 64 bpm.  No AF.  QTC 435 ms.  December 2015 ECG (independently read by me): Atrially paced rhythm at 65 with prolonged AV conduction with a PR interval at 270 ms.  No significant ST segment changes.   June 2015 ECG (independently read by me): Atrially paced rhythm at  62 beats per minute with prolonged AV conduction with a PR interval of 244 ms.  QTc interval 444 ms.  Prior 09/22/2013 ECG (independently read by me): Atrial paced rhythm at 63 beats per minute with ventricular sensing.   LABS:  BMP Latest Ref Rng & Units 01/26/2019 01/24/2019 01/22/2019  Glucose 70 - 99 mg/dL 94 134(H) 87  BUN 8 - 23 mg/dL 6(L) 6(L) 8  Creatinine 0.44 - 1.00 mg/dL 0.87 0.73 0.78  Sodium 135 - 145 mmol/L 131(L) 128(L) 129(L)  Potassium 3.5 - 5.1 mmol/L 4.1 4.2 4.5  Chloride 98 - 111 mmol/L 100 96(L) 99  CO2 22 - 32 mmol/L '24 25 23  '$ Calcium 8.9 - 10.3 mg/dL 8.9 8.4(L) 8.4(L)    Hepatic Function Latest Ref Rng & Units 01/22/2016 12/21/2014 12/20/2014  Total Protein 6.5 - 8.1 g/dL 6.9 4.8(L) 6.0(L)  Albumin 3.5 - 5.0 g/dL 4.0 2.3(L) 3.0(L)  AST 15 - 41 U/L '26 19 25  '$ ALT 14 - 54 U/L 14 13(L) 14  Alk Phosphatase 38 - 126 U/L 40 37(L) 47  Total Bilirubin 0.3 - 1.2 mg/dL 0.6 0.4 0.5  Bilirubin, Direct 0.0 - 0.3 mg/dL - - -    CBC Latest Ref Rng & Units 01/26/2019 01/24/2019 01/23/2019  WBC 4.0 - 10.5 K/uL 5.9 6.5 6.1  Hemoglobin 12.0 - 15.0 g/dL 9.6(L) 9.8(L) 9.1(L)  Hematocrit 36.0 - 46.0 % 28.9(L) 29.6(L) 27.1(L)  Platelets 150 - 400 K/uL 314 253 226   Lab Results  Component Value Date   MCV 91.5 01/26/2019   MCV 92.2 01/24/2019   MCV 91.2 01/23/2019    Lab Results  Component Value Date   TSH 6.630 (H) 08/30/2014   Lab Results  Component Value Date    HGBA1C 5.3 04/01/2011    BNP    Component Value Date/Time   PROBNP 1,110.0 (H) 09/18/2013 0332    Lipid Panel     Component Value Date/Time   CHOL  12/19/2006 0315    172        ATP III CLASSIFICATION:  <200     mg/dL   Desirable  200-239  mg/dL   Borderline High  >=240    mg/dL   High   TRIG 293 (H) 12/19/2006 0315   HDL 37 (L) 12/19/2006 0315   CHOLHDL 4.6 12/19/2006 0315   VLDL 59 (H) 12/19/2006 0315   LDLCALC  12/19/2006 0315    76        Total Cholesterol/HDL:CHD Risk Coronary Heart Disease Risk Table                     Men   Women  1/2 Average Risk   3.4   3.3     RADIOLOGY: No results found.  IMPRESSION:  No diagnosis found.  ASSESSMENT AND PLAN: Ms. Jacqueline Kim is a 74 year old Caucasian female who has a history of hypertension, PAF and is status post permanent pacemaker for sinus node dysfunction.  She previously was anticoagulated and most recently is no longer on anticoagulation due to fall risk.  She denies any recent recurrent episodes of atrial fibrillation documented on device monitoring.  She has lost significant weight and has significantly reduced her wine intake.  With her medication adjustments on prior evaluations, her blood pressure is now increased and no longer low with severe orthostatic hypotension.  On exam today blood pressure actually is slightly increased.  She has been taken midodrine 2 times per day at 2.5 mg.  I have suggested for the next week to try reducing this to 2.5 mg in the morning with daily blood pressure checks.  If her blood pressure remains stable and does not drop she may be able to discontinue this altogether.  Target blood pressure is less than 130/80.  She continues to be on diltiazem 120 mg at bedtime.  She has sleep apnea but stopped CPAP and I believe her machine has been returned.  She continues to be atrially paced without recurrent atrial fibrillation.  She is on levothyroxine for hypothyroidism.  She continues  to experience left thigh soreness resulting from a prior fall resulting in left thigh hematoma and left periorbital laceration from August 2010.  I will see her in several months for follow-up evaluation and further recommendations will be made at that time.  Time spent: 25 minutes Troy Sine, MD, [facility]  07/14/2019 12:20 PM

## 2019-07-15 ENCOUNTER — Encounter: Payer: Self-pay | Admitting: Cardiovascular Disease

## 2019-07-22 ENCOUNTER — Ambulatory Visit: Payer: Medicare Other | Attending: Internal Medicine

## 2019-07-22 DIAGNOSIS — Z23 Encounter for immunization: Secondary | ICD-10-CM | POA: Insufficient documentation

## 2019-07-22 NOTE — Addendum Note (Signed)
Addended by: Orlene Och on: 07/22/2019 06:56 AM   Modules accepted: Orders

## 2019-07-22 NOTE — Progress Notes (Signed)
   Covid-19 Vaccination Clinic  Name:  Jacqueline Kim    MRN: [id number] DOB: September 30, 1945  07/22/2019  [name] was observed post Covid-19 immunization for 15 minutes without incidence. She was provided with Vaccine Information Sheet and instruction to access the V-Safe system.   [name] was instructed to call 911 with any severe reactions post vaccine: Marland Kitchen Difficulty breathing  . Swelling of your face and throat  . A fast heartbeat  . A bad rash all over your body  . Dizziness and weakness    Immunizations Administered    Name Date Dose VIS Date Route   Pfizer COVID-19 Vaccine 07/22/2019 10:38 AM 0.3 mL 05/27/2019 Intramuscular   Manufacturer: [employer], [employer]   Lot: [id number]   NDC: [address]-7

## 2019-08-05 IMAGING — CT CT CERVICAL SPINE W/ CM
2 series · 10 of 14 positions shown, 11 images · non-contrast
Comparison: none

CLINICAL DATA: Bilateral shoulder and upper extremity pain, left
greater than right. Low back pain extending into the left lower
extremity.
TECHNIQUE: Contiguous axial images were obtained through the Cervical and
Lumbar spine after the intrathecal infusion of infusion. Coronal and
sagittal reconstructions were obtained of the axial image sets.

[Series 2: cspine soft · axial · 0.22mm/px · z∈[-543,-407]mm · 8 of 88 slices shown]
[im 10/88  soft-tissue]
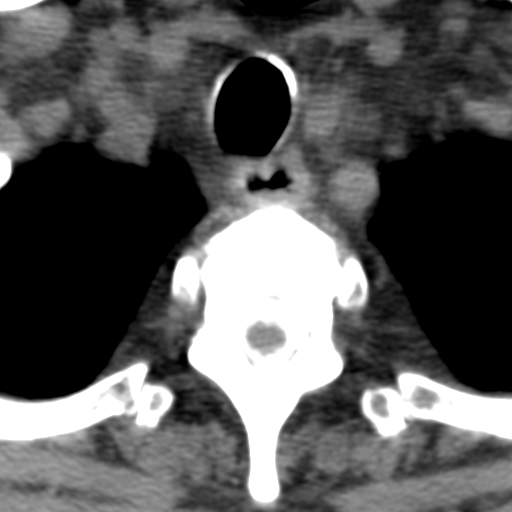
[im 20/88  soft-tissue]
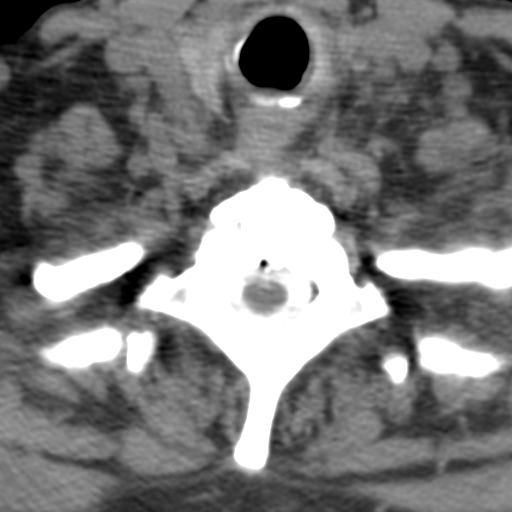
[im 30/88  soft-tissue]
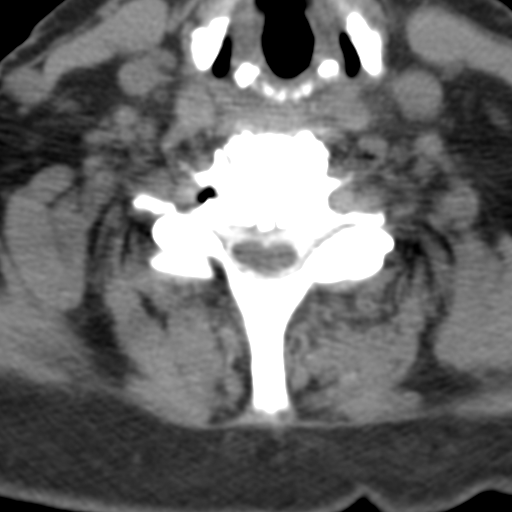
[im 39/88  soft-tissue]
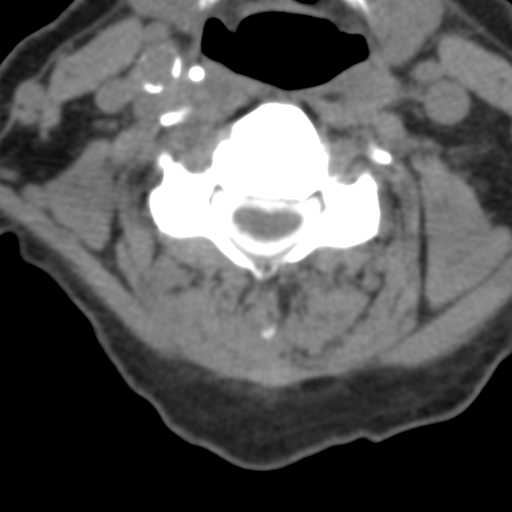
[im 49/88  soft-tissue]
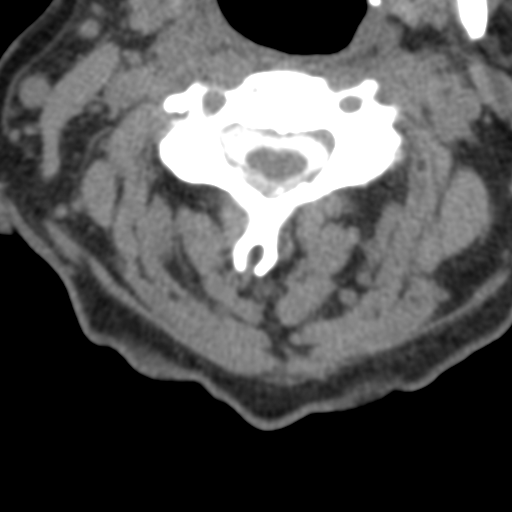
[im 59/88  soft-tissue]
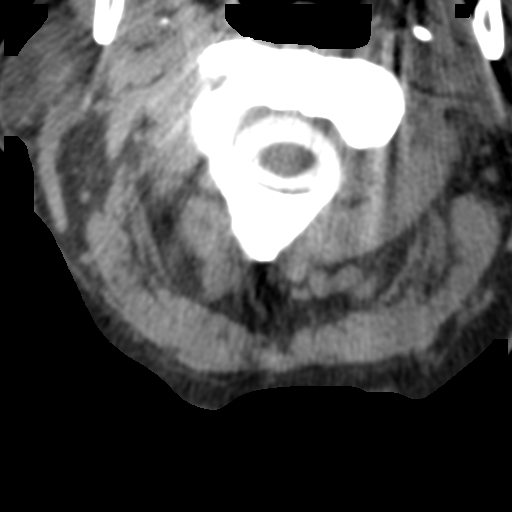
[im 68/88  soft-tissue]
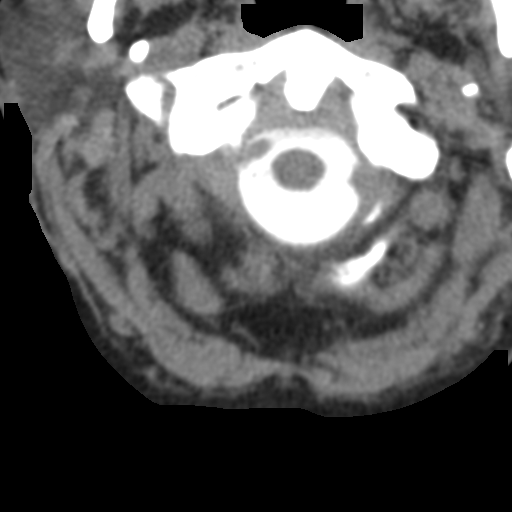
[im 78/88  soft-tissue]
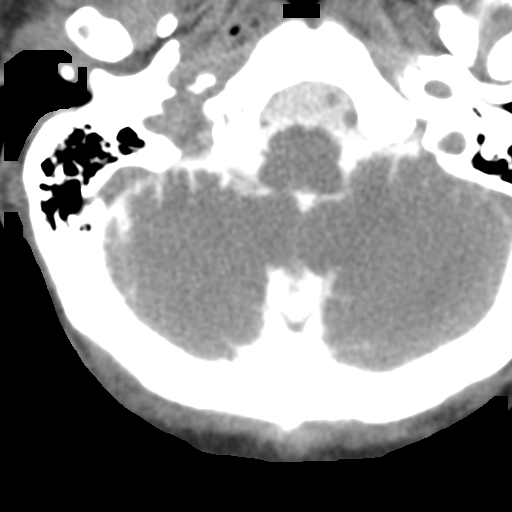

[Series 8: angled axial · axial · 0.18mm/px · z∈[-463,-443]mm · 2 of 35 slices shown, 3 images]
[im 12/35  soft-tissue]
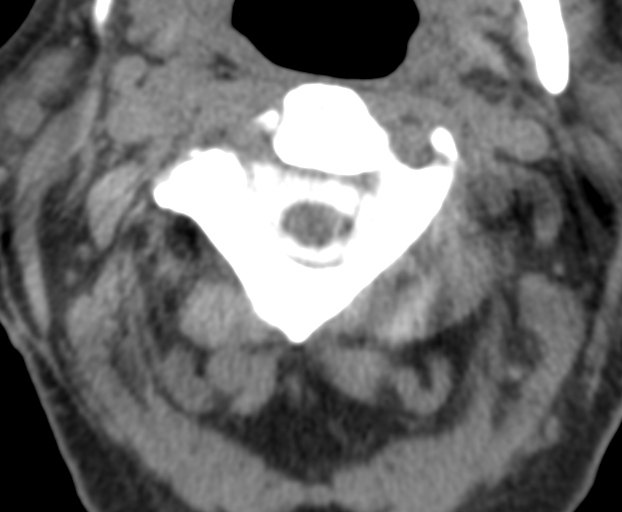
[im 12/35  bone]
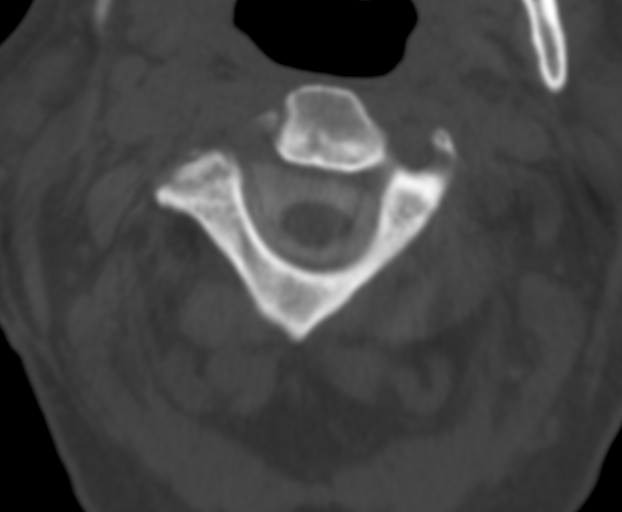
[im 23/35  bone]
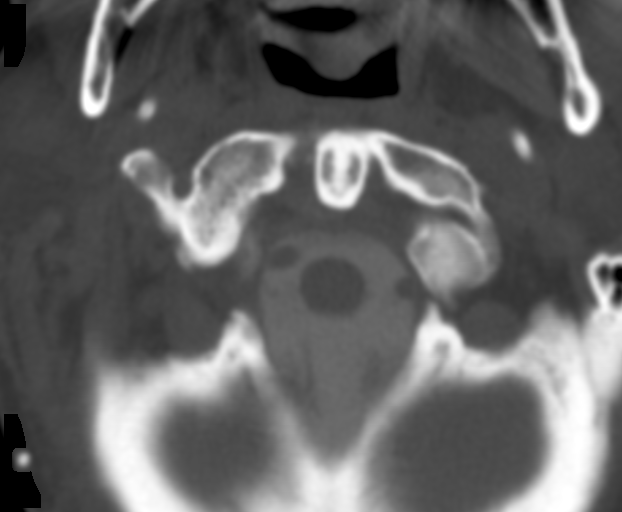

[10 of 14 positions shown; findings below may reference images not displayed]

FLUOROSCOPY TIME:  Radiation Exposure Index (as provided by the
fluoroscopic device): 343.24 uGy*m2

Fluoroscopy Time:  1 minutes 3 seconds

Number of Acquired Images:  23

PROCEDURE:
LUMBAR PUNCTURE FOR CERVICAL AND LUMBAR MYELOGRAM

CERVICAL AND LUMBAR MYELOGRAM

CT CERVICAL MYELOGRAM

CT LUMBAR MYELOGRAM

After thorough discussion of risks and benefits of the procedure
including bleeding, infection, injury to nerves, blood vessels,
adjacent structures as well as headache and CSF leak, written and
oral informed consent was obtained. Consent was obtained by Dr.
Pans Menon.

Patient was positioned prone on the fluoroscopy table. Local
anesthesia was provided with 1% lidocaine without epinephrine after
prepped and draped in the usual sterile fashion. Puncture was
performed at L4-5 using a 3 1/2 inch 22-gauge spinal needle via left
paramedian approach. Using a single pass through the dura, the
needle was placed within the thecal sac, with return of clear CSF.
10 mL of Isovue [id number] was injected into the thecal sac, with normal
opacification of the nerve roots and cauda equina consistent with
free flow within the subarachnoid space. The patient was then moved
to the trendelenburg position and contrast flowed into the Cervical
spine region.

I personally performed the lumbar puncture and administered the
intrathecal contrast. I also personally supervised acquisition of
the myelogram images.
FINDINGS: CERVICAL AND LUMBAR MYELOGRAM FINDINGS:

Scoliosis of the lumbar spine is convex to the right at L1-2 and to
the left at L5. Right subarticular narrowing is present at L4-5.
Moderate right subarticular narrowing is present at L3-4. Left
subarticular narrowing is present at L1-2 and L2-3. Right lateral
listhesis is present at L3-4. There is slight anterolisthesis at
L4-5.

Prominent disc osteophyte complex is noted at C4-5, C5-6, and C6-7.
A smaller disc osteophyte complex present at C3-4. There is blunting
of the nerve roots in particular at C4-5 and C5-6 bilaterally. Grade
1 anterolisthesis is present at C4-5. AP alignment is otherwise
anatomic..

CT CERVICAL MYELOGRAM FINDINGS:

Cervical spine is imaged from the skull base through T3. Grade 1
anterolisthesis is present at C4-5. AP alignment is otherwise
anatomic.

C2-3: A central disc protrusion partially effaces the ventral CSF.
Facet hypertrophy is worse on the right. No significant stenosis is
present.

C3-4: Asymmetric left-sided facet hypertrophy and uncovertebral
spurring contribute to moderate to severe left foraminal stenosis. A
shallow central disc protrusion is present. The right foramen is
patent.

C4-5: There is uncovering of a broad-based disc protrusion which
effaces the ventral CSF. In concert with right greater than left
facet hypertrophy there is moderate foraminal stenosis bilaterally.

C5-6: A broad-based disc osteophyte complex is present. There is
effacement of the ventral CSF. Uncovertebral and facet disease is
present bilaterally. Moderate foraminal stenosis is present
bilaterally.

C6-7: Uncovertebral disease is present bilaterally. There is chronic
loss of disc height. A central disc osteophyte complex partially
effaces the ventral CSF. Mild bilateral foraminal narrowing is
present.

C7-T1: There is chronic loss of disc height. Mild facet hypertrophy
is noted bilaterally. No focal stenosis is present.

Visualized thoracic central canal and foramina are patent. No focal
lytic or blastic lesions are present.

CT LUMBAR MYELOGRAM FINDINGS:

Lumbar spine is imaged from the midbody of T12 through the midbody
of S2. Slight anterolisthesis measures 3 mm at L4-5. There is
minimal retrolisthesis at L2-3. Rightward scoliosis is centered at
L2-3. Right lateral listhesis is present at L3-4. Left lateral
listhesis is present at L1-2.

Atherosclerotic calcifications are present in the aorta without
aneurysm. Limited imaging of the abdomen is otherwise unremarkable.
There is no significant adenopathy.

T12-L1: Negative.

L1-2: Chronic loss of disc height. A mild broad-based disc bulge is
present. There is no focal stenosis.

L2-3: Asymmetric left-sided facet hypertrophy is present. A leftward
disc protrusion and endplate spurring contribute to mild left
subarticular narrowing. Foramina are patent bilaterally.

L3-4: A broad-based disc protrusion is asymmetric to the right.
Moderate facet hypertrophy is noted bilaterally. This leads to
moderate right and mild left subarticular stenosis. Moderate right
and mild left foraminal stenosis is present.

L4-5: A mild broad-based disc protrusion is present. Facet
hypertrophy is worse on the right. This leads to mild subarticular
and foraminal narrowing bilaterally.

L5-S1: Minimal disc bulging is present. Mild facet hypertrophy is
worse on the right. There is no significant stenosis.
IMPRESSION: 1. Chronic degenerative disc disease and disc osteophyte complex in
the cervical spine.
2. Moderate to severe left foraminal stenosis at C3-4.
3. Moderate central and bilateral foraminal stenosis at C4-5 and
C5-6.
4. Grade 1 anterolisthesis at C4-5 with a prominent soft disc
protrusion.
5. Mild central and bilateral foraminal narrowing at C6-7.
6. Lumbar scoliosis.
7. Mild left subarticular narrowing at L2-3.
8. Moderate right and mild left subarticular and foraminal stenosis
is present at L3-4.
9. Mild subarticular and foraminal narrowing bilaterally at L4-5.

## 2019-09-19 ENCOUNTER — Other Ambulatory Visit: Payer: Self-pay

## 2019-09-19 ENCOUNTER — Encounter: Payer: Self-pay | Admitting: Cardiovascular Disease

## 2019-09-19 ENCOUNTER — Ambulatory Visit (INDEPENDENT_AMBULATORY_CARE_PROVIDER_SITE_OTHER): Payer: Medicare Other | Admitting: Cardiovascular Disease

## 2019-09-19 VITALS — BP 117/76 | HR 63 | Temp 97.1°F | Ht 64.0 in | Wt 121.0 lb

## 2019-09-19 DIAGNOSIS — Z950 Presence of cardiac pacemaker: Secondary | ICD-10-CM | POA: Diagnosis not present

## 2019-09-19 DIAGNOSIS — Z79899 Other long term (current) drug therapy: Secondary | ICD-10-CM

## 2019-09-19 DIAGNOSIS — I10 Essential (primary) hypertension: Secondary | ICD-10-CM | POA: Diagnosis not present

## 2019-09-19 DIAGNOSIS — I480 Paroxysmal atrial fibrillation: Secondary | ICD-10-CM | POA: Diagnosis not present

## 2019-09-19 DIAGNOSIS — I495 Sick sinus syndrome: Secondary | ICD-10-CM | POA: Diagnosis not present

## 2019-09-19 NOTE — Patient Instructions (Signed)

## 2019-09-20 ENCOUNTER — Telehealth: Payer: Self-pay | Admitting: *Deleted

## 2019-09-20 NOTE — Telephone Encounter (Signed)
LMOV[facility] (DPR) advising that new Carelink Smart monitor was ordered 09/19/19. Next transmission scheduled for 12/20/19. Direct DC phone number provided for any questions/concerns.

## 2019-09-20 NOTE — Progress Notes (Signed)
Cardiology Office Note    Date:  09/20/2019   ID:  HEIRESS WILLIAMSON, DOB [birth year], MRN [id number]  PCP:  Georgianne Fick, MD  Cardiologist:  Nicki Guadalajara, MD; Thurmon Fair, MD   No chief complaint on file.   History of Present Illness:  Jacqueline Kim is a 74 y.o. female with paroxysmal atrial fibrillation and tachy-brady sd., here for pacemaker check.  She is no longer receiving anticoagulants after serious fall complicated by extensive soft tissue hematoma requiring transfusion.  Her daughter has become more involved in her day-to-day and medical problems.  Has not had any recent falls, but concern regarding her unsteady gait remains.  She has not had any noticeable palpitations.  Full interrogation of her pacemaker is performed today.  Her pacemaker shows a very low burden of atrial fibrillation of 0.3%, primarily made up by a single 10-hour episode of atrial fibrillation that occurred on February 19.  Otherwise device function is normal.  She has 74% atrial pacing and never requires ventricular pacing.  The heart rate histogram distribution appears appropriate.  Lead parameters are normal.  Generator voltage is 2.91 V (ERI 2.81 V).  She is put on a little bit of weight back, but remains borderline underweight with a BMI of 20.7.  Her appetite is poor and the less encouraged she forgets to eat.   Past Medical History:  Diagnosis Date  . Anticoagulant long-term use  WITH PRADAXA 05/27/2011  . Anxiety   . Cellulitis of buttock, left 12/20/2014  . Complication of anesthesia    DIFFICULTY WAKING UP  . Depression   . Diverticulosis   . DJD (degenerative joint disease)   . Esophagitis 09/17/06  . External hemorrhoids   . GERD (gastroesophageal reflux disease)   . Glaucoma   . History of stress test    a. 2008 - normal nuc.  Marland Kitchen Hypertension   . Hypothyroid   . IBS (irritable bowel syndrome)   . Paroxysmal A-fib [facility]) diagnosed 2008   a. On propafenone, Pradaxa.  . 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 BREAST REDUCTION SURGERY    . BUNIONECTOMY Right 07/09/2015   with rods and pins  . CARDIOVERSION N/A 08/02/2014   Procedure: CARDIOVERSION;  Surgeon: Quintella Reichert, MD;  Location: [facility] ENDOSCOPY;  Service: Cardiovascular;  Laterality: N/A;  . CATARACT EXTRACTION, BILATERAL Bilateral   . FACIAL COSMETIC SURGERY    . FOOT SURGERY Left   . HAND SURGERY     multiple  . LIPOSUCTION    . NM MYOCAR PERF WALL MOTION  12/29/2006   No significant ischemia; EF 69%  . OVARIAN CYST REMOVAL    . PACEMAKER INSERTION  04/08/11   MDT Revo implanted by Dr Rubie Maid  . TONSILLECTOMY    . TOTAL KNEE ARTHROPLASTY Left 05/01/2015   Procedure: TOTAL LEFT KNEE ARTHROPLASTY;  Surgeon: Durene Romans, MD;  Location: [facility] ORS;  Service: Orthopedics;  Laterality: Left;  Marland Kitchen VAGINAL HYSTERECTOMY      Current Medications: Outpatient Medications Prior to Visit  Medication Sig Dispense Refill  . atenolol (TENORMIN) 25 MG tablet Take 1 tablet (25 mg total) by mouth 2 (two) times daily. 90 tablet 1  . dicyclomine (BENTYL) 10 MG/5ML solution Take 10 mg by mouth 4 (four) times daily -  before meals and at bedtime.    Marland Kitchen  diltiazem (CARDIZEM CD) 120 MG 24 hr capsule Take 120 mg by mouth daily.    Marland Kitchen escitalopram (LEXAPRO) 20 MG tablet Take 10 mg by mouth daily.    Marland Kitchen estradiol (ESTRACE) 0.5 MG tablet Take 0.5 mg by mouth daily.    Marland Kitchen HYDROcodone-acetaminophen (NORCO/VICODIN) 5-325 MG tablet Take 1 tablet by mouth every 6 (six) hours as needed.    . hydroxychloroquine (PLAQUENIL) 200 MG tablet Take 200 mg by mouth daily.    Marland Kitchen levothyroxine (SYNTHROID, LEVOTHROID) 112 MCG tablet Take 112 mcg by mouth daily before breakfast.     . linaclotide (LINZESS) 72 MCG capsule Take 1 capsule (72 mcg total) by mouth daily before breakfast. 30 capsule 3  .  propafenone (RYTHMOL) 150 MG tablet TAKE ONE AND ONE-HALF TABLETS BY MOUTH EVERY MORNING, ONE TABLET BY MOUTH MID-DAY, AND ONE AND ONE-HALF TABLETS AT BEDTIME 360 tablet 1  . traZODone (DESYREL) 150 MG tablet Take by mouth at bedtime.    . dicyclomine (BENTYL) 10 MG capsule TAKE ONE CAPSULE 3 TIMES DAILY BEFORE MEALS (Patient taking differently: Take 10 mg by mouth 3 (three) times daily before meals. ) 90 capsule 1  . diltiazem (CARDIZEM CD) 120 MG 24 hr capsule TAKE ONE CAPSULE BY MOUTH EVERY NIGHT AT BEDTIME (Patient taking differently: Take 120 mg by mouth at bedtime. ) 90 capsule 3  . traZODone (DESYREL) 150 MG tablet Cut the half a tablet in half to make (37.5 mg) (Patient taking differently: Take 37.5 mg by mouth at bedtime. ) 30 tablet 1   No facility-administered medications prior to visit.     Allergies:   Azithromycin, Epinephrine, Penicillins, Lidocaine, and Metoprolol   Social History   Socioeconomic History  . Marital status: Divorced    Spouse name: Not on file  . Number of children: 3  . Years of education: Not on file  . Highest education level: Not on file  Occupational History  . Occupation: retired    Associate Professor: RETIRED  Tobacco Use  . Smoking status: Former Smoker    Quit date: 08/30/1996    Years since quitting: 23.0  . Smokeless tobacco: Never Used  Substance and Sexual Activity  . Alcohol use: Yes    Alcohol/week: 6.0 standard drinks    Types: 6 Glasses of wine per week    Comment: daily  . Drug use: No  . Sexual activity: Yes  Other Topics Concern  . Not on file  Social History Narrative   Lives in [place],  Former [employer],  Separated.     Social Determinants of Health   Financial Resource Strain:   . Difficulty of Paying Living Expenses:   Food Insecurity:   . Worried About Programme researcher, broadcasting/film/video in the Last Year:   . Barista in the Last Year:   Transportation Needs:   . Freight forwarder (Medical):   Marland Kitchen Lack of  Transportation (Non-Medical):   Physical Activity:   . Days of Exercise per Week:   . Minutes of Exercise per Session:   Stress:   . Feeling of Stress :   Social Connections:   . Frequency of Communication with Friends and Family:   . Frequency of Social Gatherings with Friends and Family:   . Attends Religious Services:   . Active Member of Clubs or Organizations:   . Attends Banker Meetings:   Marland Kitchen Marital Status:      Family History:  The patient's family history includes  Anesthesia problems in her mother; Colon polyps in her mother.   ROS:   Please see the history of present illness.    All other systems are reviewed and are negative.   PHYSICAL EXAM:   VS:  BP 117/76   Pulse 63   Temp (!) 97.1 F (36.2 C)   Ht 5\' 4"  (1.626 m)   Wt 121 lb (54.9 kg)   SpO2 99%   BMI 20.77 kg/m     General: Alert, oriented x3, no distress, very slender, healthy left subclavian pacemaker site Head: no evidence of trauma, PERRL, EOMI, no exophtalmos or lid lag, no myxedema, no xanthelasma; normal ears, nose and oropharynx Neck: normal jugular venous pulsations and no hepatojugular reflux; brisk carotid pulses without delay and no carotid bruits Chest: clear to auscultation, no signs of consolidation by percussion or palpation, normal fremitus, symmetrical and full respiratory excursions Cardiovascular: normal position and quality of the apical impulse, regular rhythm, normal first and second heart sounds, no murmurs, rubs or gallops Abdomen: no tenderness or distention, no masses by palpation, no abnormal pulsatility or arterial bruits, normal bowel sounds, no hepatosplenomegaly Extremities: no clubbing, cyanosis or edema; 2+ radial, ulnar and brachial pulses bilaterally; 2+ right femoral, posterior tibial and dorsalis pedis pulses; 2+ left femoral, posterior tibial and dorsalis pedis pulses; no subclavian or femoral bruits Neurological: grossly nonfocal Psych: Normal mood and  affect  Wt Readings from Last 3 Encounters:  09/19/19 121 lb (54.9 kg)  07/13/19 123 lb (55.8 kg)  02/07/19 117 lb (53.1 kg)      Studies/Labs Reviewed:   EKG:  EKG is not ordered today.  ECG from 07/13/2019 shows atrial paced, ventricular sensed rhythm with a long PR interval of 288 ms, otherwise normal tracing.  No ischemic changes are seen.  QTc 448 ms  Recent Labs: 01/26/2019: BUN 6; Creatinine, Ser 0.87; Hemoglobin 9.6; Magnesium 1.6; Platelets 314; Potassium 4.1; Sodium 131   Lipid Panel    Component Value Date/Time   CHOL  12/19/2006 0315    172        ATP III CLASSIFICATION:  <200     mg/dL   Desirable  200-239  mg/dL   Borderline High  >=240    mg/dL   High   TRIG 293 (H) 12/19/2006 0315   HDL 37 (L) 12/19/2006 0315   CHOLHDL 4.6 12/19/2006 0315   VLDL 59 (H) 12/19/2006 0315   LDLCALC  12/19/2006 0315    76        Total Cholesterol/HDL:CHD Risk Coronary Heart Disease Risk Table                     Men   Women  1/2 Average Risk   3.4   3.3    Additional studies/ records that were reviewed today include:  Notes from recent hospitalization.  ASSESSMENT:    1. Paroxysmal atrial fibrillation (HCC)   2. SSS (sick sinus syndrome) ([place])   3. Pacemaker   4. Essential hypertension   5. Long term current use of antiarrhythmic drug      PLAN:  In order of problems listed above:  1. AFib: Overall she has an excellent response to antiarrhythmic therapy with propafenone.  She has not had any proarrhythmic events or QT prolongation.  The overall burden of atrial fibrillation is 0.3%.  She did have a lengthy episode of atrial fibrillation in February.  CHADSVasc 3 without history of stroke or TIA.  Recent severe bleeding problems  after fall.  Will discuss with Dr. [name], but at this point it seems the risk of falls/bleeding still outweigh the potential embolic risk reduction.. 2. SSS: Appropriate sensor settings based on heart rate histogram analysis. 3. PPM: Normal  device function.  Now that she has the support of her family she will try to start doing remote downloads every 3 months. 4. HTN: Good control. 5. Antiarrhythmic therapy monitoring: QT interval is normal although she takes both propafenone and hydroxychloroquine.  Check QT interval at least every 6 months.  Medication Adjustments/Labs and Tests Ordered: Current medicines are reviewed at length with the patient today.  Concerns regarding medicines are outlined above.  Medication changes, Labs and Tests ordered today are listed in the Patient Instructions below. Patient Instructions  Medication Instructions:  No changes *If you need a refill on your cardiac medications before your next appointment, please call your pharmacy*   Lab Work: None ordered If you have labs (blood work) drawn today and your tests are completely normal, you will receive your results only by: Marland Kitchen MyChart Message (if you have MyChart) OR . A paper copy in the mail If you have any lab test that is abnormal or we need to change your treatment, we will call you to review the results.   Testing/Procedures: None ordered   Follow-Up: At [facility], you and your health needs are our priority.  As part of our continuing mission to provide you with exceptional heart care, we have created designated Provider Care Teams.  These Care Teams include your primary Cardiologist (physician) and Advanced Practice Providers (APPs -  Physician Assistants and Nurse Practitioners) who all work together to provide you with the care you need, when you need it.  We recommend signing up for the patient portal called "MyChart".  Sign up information is provided on this After Visit Summary.  MyChart is used to connect with patients for Virtual Visits (Telemedicine).  Patients are able to view lab/test results, encounter notes, upcoming appointments, etc.  Non-urgent messages can be sent to your provider as well.   To learn more about what you  can do with MyChart, go to [email or web address].    Your next appointment:   6 month(s)  The format for your next appointment:   In Person  Provider:   Thurmon Fair, MD        Signed, Thurmon Fair, MD  09/20/2019 7:29 PM    [facility] [address] [place], [place], [place]  [address] Phone: [phone]; Fax: [phone]

## 2019-09-20 NOTE — Telephone Encounter (Signed)
-----   Message from Sandi Mariscal, RN sent at 09/19/2019  5:24 PM EDT ----- Regarding: 3 month downloads The patient will need to be set up with 3 month downloads. Jill ([employer]) did order her a new transmitter today-thanks!

## 2019-09-22 ENCOUNTER — Other Ambulatory Visit: Payer: Self-pay | Admitting: Cardiovascular Disease

## 2019-09-22 ENCOUNTER — Other Ambulatory Visit (HOSPITAL_COMMUNITY): Payer: Self-pay | Admitting: Internal Medicine

## 2019-09-23 ENCOUNTER — Other Ambulatory Visit: Payer: Self-pay | Admitting: Cardiovascular Disease

## 2019-09-27 ENCOUNTER — Ambulatory Visit (INDEPENDENT_AMBULATORY_CARE_PROVIDER_SITE_OTHER): Payer: Medicare Other | Admitting: *Deleted

## 2019-09-27 DIAGNOSIS — I480 Paroxysmal atrial fibrillation: Secondary | ICD-10-CM

## 2019-09-27 LAB — CUP PACEART REMOTE DEVICE CHECK
Battery Voltage: 2.92 V
Brady Statistic AP VP Percent: 0 %
Brady Statistic AP VS Percent: 94.54 %
Brady Statistic AS VP Percent: 0.01 %
Brady Statistic AS VS Percent: 5.45 %
Brady Statistic RA Percent Paced: 94.54 %
Brady Statistic RV Percent Paced: 0.01 %
Date Time Interrogation Session: 20210413142445
Implantable Lead Implant Date: 20121023
Implantable Lead Implant Date: 20121023
Implantable Lead Location: 753859
Implantable Lead Location: 753860
Implantable Pulse Generator Implant Date: 20121023
Lead Channel Impedance Value: 1488 Ohm
Lead Channel Impedance Value: 400 Ohm
Lead Channel Sensing Intrinsic Amplitude: 1.956 mV
Lead Channel Sensing Intrinsic Amplitude: 9.372 mV
Lead Channel Setting Pacing Amplitude: 2 V
Lead Channel Setting Pacing Amplitude: 2.5 V
Lead Channel Setting Pacing Pulse Width: 0.6 ms
Lead Channel Setting Sensing Sensitivity: 0.9 mV

## 2019-09-28 NOTE — Progress Notes (Signed)
PPM Remote  

## 2019-10-03 ENCOUNTER — Encounter: Payer: Self-pay | Admitting: Cardiovascular Disease

## 2019-10-03 ENCOUNTER — Ambulatory Visit (INDEPENDENT_AMBULATORY_CARE_PROVIDER_SITE_OTHER): Payer: Medicare Other | Admitting: Cardiovascular Disease

## 2019-10-03 ENCOUNTER — Other Ambulatory Visit: Payer: Self-pay

## 2019-10-03 DIAGNOSIS — I480 Paroxysmal atrial fibrillation: Secondary | ICD-10-CM

## 2019-10-03 DIAGNOSIS — I495 Sick sinus syndrome: Secondary | ICD-10-CM | POA: Diagnosis not present

## 2019-10-03 DIAGNOSIS — Z950 Presence of cardiac pacemaker: Secondary | ICD-10-CM | POA: Diagnosis not present

## 2019-10-03 DIAGNOSIS — E039 Hypothyroidism, unspecified: Secondary | ICD-10-CM

## 2019-10-03 DIAGNOSIS — I10 Essential (primary) hypertension: Secondary | ICD-10-CM | POA: Diagnosis not present

## 2019-10-03 DIAGNOSIS — G4733 Obstructive sleep apnea (adult) (pediatric): Secondary | ICD-10-CM

## 2019-10-03 NOTE — Patient Instructions (Signed)

## 2019-10-03 NOTE — Progress Notes (Signed)
Patient ID: Jacqueline Kim, female   DOB: 07/15/1945, 74 y.o.   MRN: [id number]     HPI: Jacqueline Kim is a 74 y.o. female  who presents for a 3 month follow-up cardiologic evaluation.  Jacqueline Kim has known history of sinus node dysfunction. She has history of paroxysmal atrial fibrillation. She is status post permanent pacemaker implanted on 04/08/2011 Medtronic Revo MRI compatible pacemaker. She also has a history of hypothyroidism. She has had recurrent episodes of paroxysmal atrial fibrillation and has been on pradaxa anticoagulation for several years after initially having been on warfarin.  She has been maintained on rhythmol SR 325 mg twice a day and atenolol 50 mg daily.  She also is on losartan 50 mg for hypertension.  She was hospitalized in January 2014 with recurrent AF in the setting of significant increased family stress. She is now separated and living in a different household from her husband [name].  She has had several episodes of recurrent AF and had an episode on 03/12/2013 which lasted for approximately 12 hours.  On the Saturday before Easter 2015 when she was rushing with family members and trying to prepare dinner she was hospitalized and presented with a heart rate at 150.  She was started on IV Cardizem for rate control.  She spontaneously cardioverted to sinus rhythm on April 6 prior to a planned TEE cardioversion.  I saw her in followup of that hospitalization and suggested an EP evaluation.  She saw [name] for discussion concerning atrial fibrillation ablation.  Apparently, at that time, the patient was feeling well. She opted against pursuing AF ablation presently.    A followup echo Doppler study on 10/24/2013 showed an ejection fraction of 60-65% and there was evidence for grade 1 diastolic function without wall motion abnormalities.  Her left atrial dimension was normal.    She is followed by [name] for her pacemaker.  She was hospitalized in early  February 2016 following a prolonged episode of atrial fibrillation with rapid ventricular response for which she was symptomatic.  She underwent her initial cardioversion on 07/27/2014.  When she saw [name] on August 08, 2014 interrogation of her pacemaker showed a 28 hour episode of atrial fibrillation without recurrence since the cardioversion.  She again developed a recurrent episode of atrial fibrillation on 08/30/2014 which led to an emergency room evaluation.  I last saw her in March.  I recommended follow-up evaluation with [name] and reconsideration of possible atrial fibrillation ablation.  She saw [name] at that time admitted that she was drinking wine on a daily basis.  Rather than pursue ablation she elected to reduce her EtOH intake.  She feels that she has done well and has felt one episode of palpitations during her period of increased stress that she is aware of since her last evaluation..  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  She denies exertional chest pain, shortness of breath, orthopnea, PND, presyncope or syncope or any neurologic sequelae.  When she met with [name].  There was no discussion of changing her anti-rhythmic therapy.    She underwent left knee surgery by [name].  She also fell on December 20, 2014 in the shower and was hospitalized for 2 days.  She developed a cellulitis and buttock abscess, which required anabiotic therapy.    She  lives at [facility].  She denies any episodes of  chest pain.  At times she notes some shortness of breath with activity.  She saw [name] in October 2017 and her pacemaker was functioning normally.  She did not have any recurrent atrial fibrillation and had only 2 brief episodes of PAT which combined last a total of 54 seconds.  She states she had stopped taking the losartan, which had been 50 mg because that time, she noted some lightheadedness unassociated with any  rhythm disturbance.  In August 2017, a CT of her abdomen was performed which did not explain some of her issues at the time of rectal bleeding and constipation.  She was found to have mild colonic diverticulosis without diverticulitis.  Incidentally, she was noted to have abdominal aortic atherosclerosis.    When I saw her in 2018, she was hypertensive and I recommended reinstitution of losartan at least 25 mg depending upon her blood pressure, but if necessary to increase to 25 mg 3 times a day.  Since I last saw her in May 2018, she underwent an echo Doppler study which showed an EF of 55-60%.  She had normal diastolic parameters.  There was mild mitral regurgitation, mild left atrial dilation, and mild pulmonary hypertension with a PA peak pressure 38 mm.  She saw [name] on 11/13/2016 and her losartan dose was reduced to 25 mg daily.  She sees Dr. Naida Sleight for rheumatoid arthritis.  She is status post left knee replacement and tolerated this well.  Her primary physician is [name].    She has had issues with scoliosis and in the future may require possible surgery.  At times she has noticed some dragging of her left leg.  She is unaware of any recurrent episodes of atrial fibrillation.  Her last echo Doppler study in May 2018 showed an EF of 55-60%.  She had mild mitral regurgitation.  There was mild pulmonary hypertension with a PA pressure 38.  She had normal diastolic parameters.   She saw [name] on February 12, 2018.  At that time, interrogation of her pacemaker revealed one 3-minute episode in the last 6 months of recurrent PAF occurred on Oct 28, 2017.  However, she comes to the office today and states that last week sleeping she was awakened with a chest fluttering and was concerned that she was back in atrial fibrillation for questionable duration.   I  saw her in October 2019 and at that time she was she  there with her daughter.  She appeared much more frail.  Due to chronic back  discomfort, she was walking with assistance.  She had moved to a lower ground apartment from her second floor unit at [facility]. She  had issues with dizziness and has fallen on a couple occasions.  She was on atenolol 25 mg twice a day, propofenone150 mg tablets  1-1/2 in the morning, 1 at midday, 1-1/2 in the evening.  She was on Cardizem CD 120 mg at bedtime.  She was on Eliquis for anticoagulation.  According to the family members she was noted to have significant snoring during sleep and was often waking up in the middle the night with possible gasping for air.  She denies any recent bruises particularly following her fall.  She is on levothyroxine for hypothyroidism.  During that evaluation, she was orthostatic.  She was 100% atrially placed.  I reduced her atenolol down to 12.5 mg.  I also recommended that she reduce her Lexapro and recommended 20 to 30 mm compression  stockings.  Due to continued episodes her atenolol was ultimately discontinued altogether.  I scheduled her for a sleep study to assess for sleep apnea.  Her sleep study was done on May 08, 2018 and was positive for sleep apnea with an overall AHI at 13.4/h.  Events were worse and sleep apnea was moderate during REM sleep with an AHI of 22.5/h.  She had significant oxygen desaturation to 80%.    When I last saw her in the office on June 28, 2018 she had not yet had her CPAP titration. However, during that evaluation she appeared exceedingly frail. She had significant orthostatic hypotension and I significantly reduced her medications. I had reduced her trazodone initially from 150 mg down to 75 mg and then subsequently decrease this to 37.5 mg. In addition I completely discontinued her furosemide. She again is now wearing support stockings and she denies any orthostatic symptoms. She underwent her CPAP titration trial on June 30, 2018 and CPAP was titrated up to 14 cm of water with still events. It was recommended that  she initiate CPAP auto therapy with a minimum of 14 up to 20 cm water pressure if needed.  I evaluated her by a telemedicine visit in April 2020.  Her CPAP set up date was August 10, 2018 with [facility]. I was able to obtain a download of her use. She apparently only initially used it for short-term and of the first month only 9 out of 30 days of use with an average use at 1 hour. Her 95th percentile pressure with minimal use was 14.3 cm and her AHI remained 4.4/h. She felt that the mask was causing her to have significant neck difficulties. As result, she has not used CPAP since. At the time of that evaluation she was unaware of episodes of dizziness. She does not have a blood pressure monitor at home and cannot take her blood pressure but she feels that she is able to go from sitting to standing position without getting dizzy. She wasunaware of any recurrent episodes of obvious atrial fibrillation. She denies chest pain. She denies bleeding on anticoagulation.  She was reevaluated in a telemedicine visit in July 2020.  At that time she continued  continued to have issues with low blood pressure.  Apparently she was evaluated by Dr. Rexene Alberts for her sensory polyneuropathy and lower extremity weakness.  He will be undergoing EMG and nerve conduction studies.  There was concerned that she was drinking wine in excess.  Since I last evaluated her, she has not been using CPAP.  She denies any awareness of recurrent atrial fibrillation.  Her blood pressure has tended to be low.    She was evaluated by [name] in all 2020 for pacemaker evaluation.  There were no recurrent episodes of atrial fibrillation on propafenone.  It was felt that her risks of anticoagulation exceeded the potential benefit particularly she was maintaining sinus rhythm.  Normal pacemaker device function.  Her blood pressure was controlled at that time.  I last saw her in January 2021 and since her prior evaluation she  had significantly Kim up wine.  She has had issues with right leg pruritus.  She was unaware of recurrent atrial fibrillation or chest pain.  Her CPAP machine had been returned.  She was taking midodrine 2.5 mg twice a day, and diltiazem 120 mg at bedtime.  She is no longer taking atenolol but has a prescription and has this as a as needed basis.  She continues to be on propafenone.  She is on trazodone 37.5 mg daily.  She is here in the office today with her daughter.  Over the last 3 months since her last evaluation she has felt well.  She is living on the ground floor at [facility].  She has more energy.  She is undergoing physical therapy 2 times per week.  She denies any significant symptomatic orthostatic symptoms and is no longer taking midodrine.  She is unaware of any cardiac arrhythmias.  She has not required as needed atenolol.  She presents for evaluation.   Past Medical History:  Diagnosis Date  . Anticoagulant long-term use  WITH PRADAXA 05/27/2011  . Anxiety   . Cellulitis of buttock, left 12/20/2014  . Complication of anesthesia    DIFFICULTY WAKING UP  . Depression   . Diverticulosis   . DJD (degenerative joint disease)   . Esophagitis 09/17/06  . External hemorrhoids   . GERD (gastroesophageal reflux disease)   . Glaucoma   . History of stress test    a. 2008 - normal nuc.  Marland Kitchen Hypertension   . Hypothyroid   . IBS (irritable bowel syndrome)   . Paroxysmal A-fib [facility]) diagnosed 2008   a. On propafenone, Pradaxa.  . 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 BREAST REDUCTION SURGERY    . BUNIONECTOMY Right 07/09/2015   with rods and pins  . CARDIOVERSION N/A 08/02/2014   Procedure: CARDIOVERSION;  Surgeon: Sueanne Margarita, MD;  Location: [facility];  Service: Cardiovascular;  Laterality: N/A;  . CATARACT  EXTRACTION, BILATERAL Bilateral   . FACIAL COSMETIC SURGERY    . FOOT SURGERY Left   . HAND SURGERY     multiple  . LIPOSUCTION    . NM MYOCAR PERF WALL MOTION  12/29/2006   No significant ischemia; EF 69%  . OVARIAN CYST REMOVAL    . PACEMAKER INSERTION  04/08/11   MDT Revo implanted by Dr Recardo Evangelist  . TONSILLECTOMY    . TOTAL KNEE ARTHROPLASTY Left 05/01/2015   Procedure: TOTAL LEFT KNEE ARTHROPLASTY;  Surgeon: Paralee Cancel, MD;  Location: [facility];  Service: Orthopedics;  Laterality: Left;  Marland Kitchen VAGINAL HYSTERECTOMY      Allergies  Allergen Reactions  . Azithromycin Other (See Comments)    Causes heart rhythm issues Other reaction(s): Other (See Comments) Causes heart rhythm issues  . Epinephrine Other (See Comments)    Causes A-Fib Other reaction(s): Increased Heart Rate (intolerance) States that it caused her heart to beat fast, so she will not take it. Causes A-Fib A-fib Triggers atrial fib   . Penicillins Anaphylaxis    Has patient had a PCN reaction causing immediate rash, facial/tongue/throat swelling, SOB or lightheadedness with hypotension: Yes Has patient had a PCN reaction causing severe rash involving mucus membranes or skin necrosis: No Has patient had a PCN reaction that required hospitalization No Has patient had a PCN reaction occurring within the last 10 years: No If all of the above answers are "NO", then may proceed with Cephalosporin use.   . Lidocaine Palpitations    Or any similar medication Other reaction(s): Other (See Comments) Or any similar medication  . Metoprolol Other (See Comments)    alopecia Other reaction(s): Other (See Comments) alopecia    Current Outpatient Medications  Medication Sig Dispense Refill  .  atenolol (TENORMIN) 25 MG tablet Take 1 tablet (25 mg total) by mouth 2 (two) times daily. 90 tablet 1  . dicyclomine (BENTYL) 10 MG/5ML solution Take 10 mg by mouth 4 (four) times daily -  before meals and at bedtime.    Marland Kitchen diltiazem  (CARDIZEM CD) 120 MG 24 hr capsule Take 120 mg by mouth daily.    Marland Kitchen escitalopram (LEXAPRO) 20 MG tablet Take 10 mg by mouth daily.    Marland Kitchen estradiol (ESTRACE) 0.5 MG tablet Take 0.5 mg by mouth daily.    Marland Kitchen HYDROcodone-acetaminophen (NORCO/VICODIN) 5-325 MG tablet Take 1 tablet by mouth every 6 (six) hours as needed.    . hydroxychloroquine (PLAQUENIL) 200 MG tablet Take 200 mg by mouth daily.    Marland Kitchen levothyroxine (SYNTHROID, LEVOTHROID) 112 MCG tablet Take 112 mcg by mouth daily before breakfast.     . linaclotide (LINZESS) 72 MCG capsule Take 1 capsule (72 mcg total) by mouth daily before breakfast. 30 capsule 3  . propafenone (RYTHMOL) 150 MG tablet TAKE ONE AND ONE-HALF TABLETS BY MOUTH EVERY MORNING, ONE TABLET BY MOUTH MID-DAY, AND ONE AND ONE-HALF TABLETS AT BEDTIME 360 tablet 3  . traZODone (DESYREL) 150 MG tablet Take by mouth at bedtime.     No current facility-administered medications for this visit.    Social History   Socioeconomic History  . Marital status: Divorced    Spouse name: Not on file  . Number of children: 3  . Years of education: Not on file  . Highest education level: Not on file  Occupational History  . Occupation: retired    Fish farm manager: RETIRED  Tobacco Use  . Smoking status: Former Smoker    Quit date: 08/30/1996    Years since quitting: 23.1  . Smokeless tobacco: Never Used  Substance and Sexual Activity  . Alcohol use: Yes    Alcohol/week: 6.0 standard drinks    Types: 6 Glasses of wine per week    Comment: daily  . Drug use: No  . Sexual activity: Yes  Other Topics Concern  . Not on file  Social History Narrative   Lives in [place],  Former [place] [employer],  Separated.     Social Determinants of Health   Financial Resource Strain:   . Difficulty of Paying Living Expenses:   Food Insecurity:   . Worried About Charity fundraiser in the Last Year:   . Arboriculturist in the Last Year:   Transportation Needs:   . Film/video editor  (Medical):   Marland Kitchen Lack of Transportation (Non-Medical):   Physical Activity:   . Days of Exercise per Week:   . Minutes of Exercise per Session:   Stress:   . Feeling of Stress :   Social Connections:   . Frequency of Communication with Friends and Family:   . Frequency of Social Gatherings with Friends and Family:   . Attends Religious Services:   . Active Member of Clubs or Organizations:   . Attends Archivist Meetings:   Marland Kitchen Marital Status:   Intimate Partner Violence:   . Fear of Current or Ex-Partner:   . Emotionally Abused:   Marland Kitchen Physically Abused:   . Sexually Abused:     Family History  Problem Relation Age of Onset  . Anesthesia problems Mother   . Colon polyps Mother        beign  . Colon cancer Neg Hx    ROS General: Negative; No fevers, chills, or  night sweats;  HEENT: Negative; No changes in vision or hearing, sinus congestion, difficulty swallowing Pulmonary: Negative; No cough, wheezing, shortness of breath, hemoptysis Cardiovascular:  See HPI GI: Positive for irritable bowel syndrome for which she takes Bentyl before meals;  GU: Negative; No dysuria, hematuria, or difficulty voiding Musculoskeletal: Positive for rheumatoid arthritis, and scoliosis. Hematologic/Oncology: Negative; no easy bruising, bleeding Endocrine: Negative; no heat/cold intolerance; no diabetes Neuro: Negative; no changes in balance, headaches Skin: Negative; No rashes or skin lesions Psychiatric: There is less stress and anxiety; No behavioral problems, depression Sleep: Positive for recently diagnosed OSA.  Stop using CPAP therapy.  PE BP 138/74 (BP Location: Left Arm, Patient Position: Sitting, Cuff Size: Normal)   Pulse 63   Temp 97.9 F (36.6 C)   Ht _0  (1.626 m)   Wt 124 lb (56.2 kg)   BMI 21.28 kg/m    Blood pressure by me was 138/74 supine with dropped to 118/70 standing.  She was completely asymptomatic and felt well.  Wt Readings from Last 3 Encounters:    10/03/19 124 lb (56.2 kg)  09/19/19 121 lb (54.9 kg)  07/13/19 123 lb (55.8 kg)     Physical Exam BP 138/74 (BP Location: Left Arm, Patient Position: Sitting, Cuff Size: Normal)   Pulse 63   Temp 97.9 F (36.6 C)   Ht _1  (1.626 m)   Wt 124 lb (56.2 kg)   BMI 21.28 kg/m  General: Alert, oriented, no distress.  Skin: normal turgor, no rashes, warm and dry HEENT: Normocephalic, atraumatic. Pupils equal round and reactive to light; sclera anicteric; extraocular muscles intact;  Nose without nasal septal hypertrophy Mouth/Parynx benign; Mallinpatti scale 3 Neck: No JVD, no carotid bruits; normal carotid upstroke Lungs: clear to ausculatation and percussion; no wheezing or rales Chest wall: without tenderness to palpitation Heart: PMI not displaced, RRR, s1 s2 normal, 1/6 systolic murmur, no diastolic murmur, no rubs, gallops, thrills, or heaves Abdomen: soft, nontender; no hepatosplenomehaly, BS+; abdominal aorta nontender and not dilated by palpation. Back: no CVA tenderness Pulses 2+ Musculoskeletal: full range of motion, normal strength, no joint deformities Extremities: no clubbing cyanosis or edema, Homan's sign negative  Neurologic: grossly nonfocal; Cranial nerves grossly wnl Psychologic: Normal mood and affect    ECG (independently read by me): Atrially paced rhythm with prolonged AV conduction with PR interval of 292 ms; QTc interval 458 ms    July 13, 2019 ECG (independently read by me): Atrially paced rhythm at 61 bpm.  Prolonged AV conduction with PR interval to 288 ms.  No ectopy  January 2020 ECG (independently read by me): Lee paced rhythm with prolonged AV conduction.  Ventricular rate 65 bpm.  PR interval 248 ms.  No ectopy.  October 2019 ECG (independently read by me): Atrially paced rhythm with prolonged AV conduction with PR interval 264 ms.  Ventricular rate 64 bpm.  QTc interval 478 ms.  May 2019 ECG (independently read by me): Atrially paced rhythm  at 62 bpm with prolonged AV conduction.  PR interval _2 ms.  QTc interval _3 ms.  November 2018 ECG (independently read by me): atrially paced rhythm with prolonged AV conduction with PR interval at 264 ms.  PAC.  QTc interval increase that 503 ms.  May 2018 ECG (independently read by me): Atrially paced rhythm at 69 bpm.  Prolonged AV conduction with PR interval 266 milliseconds.  Increased QTc interval 497 ms.  August 2016 ECG (independently read by me): Atrially  paced rhythm at 66 bpm.  PR interval 232 ms.  QTc interval normal at 448 ms.  ECG (independently read by me): Atrially paced rhythm at 64 bpm.  No AF.  QTC 435 ms.  December 2015 ECG (independently read by me): Atrially paced rhythm at 65 with prolonged AV conduction with a PR interval at 270 ms.  No significant ST segment changes.   June 2015 ECG (independently read by me): Atrially paced rhythm at 62 beats per minute with prolonged AV conduction with a PR interval of 244 ms.  QTc interval 444 ms.  Prior 09/22/2013 ECG (independently read by me): Atrial paced rhythm at 63 beats per minute with ventricular sensing.   LABS:  BMP Latest Ref Rng & Units 01/26/2019 01/24/2019 01/22/2019  Glucose 70 - 99 mg/dL 94 134(H) 87  BUN 8 - 23 mg/dL 6(L) 6(L) 8  Creatinine 0.44 - 1.00 mg/dL 0.87 0.73 0.78  Sodium 135 - 145 mmol/L 131(L) 128(L) 129(L)  Potassium 3.5 - 5.1 mmol/L 4.1 4.2 4.5  Chloride 98 - 111 mmol/L 100 96(L) 99  CO2 22 - 32 mmol/L _0 Calcium 8.9 - 10.3 mg/dL 8.9 8.4(L) 8.4(L)    Hepatic Function Latest Ref Rng & Units 01/22/2016 12/21/2014 12/20/2014  Total Protein 6.5 - 8.1 g/dL 6.9 4.8(L) 6.0(L)  Albumin 3.5 - 5.0 g/dL 4.0 2.3(L) 3.0(L)  AST 15 - 41 U/L _1 ALT 14 - 54 U/L 14 13(L) 14  Alk Phosphatase 38 - 126 U/L 40 37(L) 47  Total Bilirubin 0.3 - 1.2 mg/dL 0.6 0.4 0.5  Bilirubin, Direct 0.0 - 0.3 mg/dL - - -    CBC Latest Ref Rng & Units 01/26/2019 01/24/2019 01/23/2019  WBC 4.0 - 10.5 K/uL 5.9 6.5 6.1    Hemoglobin 12.0 - 15.0 g/dL 9.6(L) 9.8(L) 9.1(L)  Hematocrit 36.0 - 46.0 % 28.9(L) 29.6(L) 27.1(L)  Platelets 150 - 400 K/uL 314 253 226   Lab Results  Component Value Date   MCV 91.5 01/26/2019   MCV 92.2 01/24/2019   MCV 91.2 01/23/2019    Lab Results  Component Value Date   TSH 6.630 (H) 08/30/2014   Lab Results  Component Value Date   HGBA1C 5.3 04/01/2011    BNP    Component Value Date/Time   PROBNP 1,110.0 (H) 09/18/2013 0332    Lipid Panel     Component Value Date/Time   CHOL  12/19/2006 0315    172        ATP III CLASSIFICATION:  <200     mg/dL   Desirable  200-239  mg/dL   Borderline High  >=240    mg/dL   High   TRIG 293 (H) 12/19/2006 0315   HDL 37 (L) 12/19/2006 0315   CHOLHDL 4.6 12/19/2006 0315   VLDL 59 (H) 12/19/2006 0315   LDLCALC  12/19/2006 0315    76        Total Cholesterol/HDL:CHD Risk Coronary Heart Disease Risk Table                     Men   Women  1/2 Average Risk   3.4   3.3     RADIOLOGY: No results found.  IMPRESSION:  1. Essential hypertension   2. Paroxysmal atrial fibrillation (HCC)   3. SSS (sick sinus syndrome) ([place])   4. Pacemaker   5. Obstructive sleep apnea (adult) (pediatric): no longer on CPAP   6. Hypothyroidism, unspecified type  ASSESSMENT AND PLAN: Jacqueline Kim is a 74 year old Caucasian female who has a history of hypertension, PAF and is status post permanent pacemaker for sinus node dysfunction.  She previously was anticoagulated and most recently is no longer on anticoagulation due to fall risk.  She denies any recent recurrent episodes of atrial fibrillation documented on device monitoring.  She has lost significant weight and has significantly reduced her wine intake.  With her medication adjustments on prior evaluations, her blood pressure is now increased and no longer low with severe orthostatic hypotension.  Over the past several months she has gained strength and has felt well.  She  is no longer taking midodrine.  Her blood pressure today is stable although she did have a systolic drop from 786 to 118 going from supine to standing.  She was asymptomatic and felt well.  She is obtaining sinus rhythm and is atrial paced on her ECG.  She now has been enrolled in remote pacemaker checks scheduled at 104-monthintervals.  She continues to be on propafenone and is without issue.  She continues to be on diltiazem 120 mg daily.  QTc interval is normal and she continues to be on hydroxychloroquine for rheumatoid arthritis.  She was recently seen by Dr. CSallyanne Kusterfor pacemaker check and she had a very low burden of atrial fibrillation at 0.3% predominantly due to a single 10-hour episode of A. fib which occurred on February 19.  She continues to be on levothyroxine 112 mcg for hypothyroidism.  She will continue with current therapy.  I will see her in 6 months for reevaluation or sooner if problems arise.   TTroy Sine MD, [facility] 10/05/2019 10:15 PM

## 2019-10-05 ENCOUNTER — Encounter: Payer: Self-pay | Admitting: Cardiovascular Disease

## 2019-10-19 ENCOUNTER — Other Ambulatory Visit: Payer: Self-pay | Admitting: Cardiovascular Disease

## 2019-11-25 NOTE — Telephone Encounter (Signed)
LM to call our office back.

## 2019-11-25 NOTE — Telephone Encounter (Signed)
Transmission received, Pt did appear to have been in AF. Forwarding to Dr. Royann Shivers.      Presenting EGM  Last AF episode

## 2019-12-06 ENCOUNTER — Other Ambulatory Visit: Payer: Self-pay | Admitting: Obstetrics & Gynecology

## 2019-12-06 DIAGNOSIS — Z1231 Encounter for screening mammogram for malignant neoplasm of breast: Secondary | ICD-10-CM

## 2019-12-14 ENCOUNTER — Other Ambulatory Visit (HOSPITAL_COMMUNITY): Payer: Self-pay | Admitting: Internal Medicine

## 2020-01-02 ENCOUNTER — Ambulatory Visit
Admission: RE | Admit: 2020-01-02 | Discharge: 2020-01-02 | Disposition: A | Discharge status: Home / Self Care | Payer: Medicare Other | Source: Ambulatory Visit

## 2020-01-02 ENCOUNTER — Other Ambulatory Visit: Payer: Self-pay

## 2020-01-02 DIAGNOSIS — Z1231 Encounter for screening mammogram for malignant neoplasm of breast: Secondary | ICD-10-CM

## 2020-01-12 ENCOUNTER — Other Ambulatory Visit: Payer: Self-pay | Admitting: Cardiovascular Disease

## 2020-01-16 ENCOUNTER — Ambulatory Visit (INDEPENDENT_AMBULATORY_CARE_PROVIDER_SITE_OTHER): Payer: Medicare Other | Admitting: *Deleted

## 2020-01-16 DIAGNOSIS — I495 Sick sinus syndrome: Secondary | ICD-10-CM

## 2020-01-17 LAB — CUP PACEART REMOTE DEVICE CHECK
Battery Voltage: 2.9 V
Brady Statistic AP VP Percent: 0.18 %
Brady Statistic AP VS Percent: 76.56 %
Brady Statistic AS VP Percent: 0.13 %
Brady Statistic AS VS Percent: 23.13 %
Brady Statistic RA Percent Paced: 75.18 %
Brady Statistic RV Percent Paced: 0.27 %
Date Time Interrogation Session: 20210802132406
Implantable Lead Implant Date: 20121023
Implantable Lead Implant Date: 20121023
Implantable Lead Location: 753859
Implantable Lead Location: 753860
Implantable Pulse Generator Implant Date: 20121023
Lead Channel Impedance Value: 1456 Ohm
Lead Channel Impedance Value: 400 Ohm
Lead Channel Sensing Intrinsic Amplitude: 2.217 mV
Lead Channel Sensing Intrinsic Amplitude: 8.368 mV
Lead Channel Setting Pacing Amplitude: 2 V
Lead Channel Setting Pacing Amplitude: 2.5 V
Lead Channel Setting Pacing Pulse Width: 0.6 ms
Lead Channel Setting Sensing Sensitivity: 0.9 mV

## 2020-01-18 NOTE — Progress Notes (Signed)
Remote pacemaker transmission.   

## 2020-01-19 ENCOUNTER — Other Ambulatory Visit: Payer: Self-pay | Admitting: Cardiovascular Disease

## 2020-03-05 ENCOUNTER — Emergency Department (HOSPITAL_COMMUNITY): Payer: Medicare Other

## 2020-03-05 ENCOUNTER — Other Ambulatory Visit: Payer: Self-pay

## 2020-03-05 ENCOUNTER — Emergency Department (HOSPITAL_COMMUNITY)
Admission: EM | Admit: 2020-03-05 | Discharge: 2020-03-05 | Disposition: A | Discharge status: Home / Self Care | Payer: Medicare Other | Attending: Emergency Medicine | Admitting: Emergency Medicine

## 2020-03-05 ENCOUNTER — Encounter (HOSPITAL_COMMUNITY): Payer: Self-pay | Admitting: Radiology

## 2020-03-05 DIAGNOSIS — Z79890 Hormone replacement therapy (postmenopausal): Secondary | ICD-10-CM | POA: Insufficient documentation

## 2020-03-05 DIAGNOSIS — Z87891 Personal history of nicotine dependence: Secondary | ICD-10-CM | POA: Insufficient documentation

## 2020-03-05 DIAGNOSIS — Z950 Presence of cardiac pacemaker: Secondary | ICD-10-CM | POA: Diagnosis not present

## 2020-03-05 DIAGNOSIS — R509 Fever, unspecified: Secondary | ICD-10-CM | POA: Diagnosis not present

## 2020-03-05 DIAGNOSIS — R410 Disorientation, unspecified: Secondary | ICD-10-CM

## 2020-03-05 DIAGNOSIS — N3090 Cystitis, unspecified without hematuria: Secondary | ICD-10-CM | POA: Diagnosis not present

## 2020-03-05 DIAGNOSIS — R112 Nausea with vomiting, unspecified: Secondary | ICD-10-CM | POA: Insufficient documentation

## 2020-03-05 DIAGNOSIS — E039 Hypothyroidism, unspecified: Secondary | ICD-10-CM | POA: Insufficient documentation

## 2020-03-05 DIAGNOSIS — I10 Essential (primary) hypertension: Secondary | ICD-10-CM | POA: Diagnosis present

## 2020-03-05 DIAGNOSIS — N390 Urinary tract infection, site not specified: Secondary | ICD-10-CM

## 2020-03-05 DIAGNOSIS — Z79899 Other long term (current) drug therapy: Secondary | ICD-10-CM | POA: Diagnosis not present

## 2020-03-05 DIAGNOSIS — R5383 Other fatigue: Secondary | ICD-10-CM | POA: Diagnosis not present

## 2020-03-05 DIAGNOSIS — Z96652 Presence of left artificial knee joint: Secondary | ICD-10-CM | POA: Diagnosis not present

## 2020-03-05 LAB — COMPREHENSIVE METABOLIC PANEL
ALT: 54 U/L — ABNORMAL HIGH (ref 0–44)
AST: 54 U/L — ABNORMAL HIGH (ref 15–41)
Albumin: 3.8 g/dL (ref 3.5–5.0)
Alkaline Phosphatase: 71 U/L (ref 38–126)
Anion gap: 11 (ref 5–15)
BUN: 21 mg/dL (ref 8–23)
CO2: 25 mmol/L (ref 22–32)
Calcium: 9.1 mg/dL (ref 8.9–10.3)
Chloride: 97 mmol/L — ABNORMAL LOW (ref 98–111)
Creatinine, Ser: 0.99 mg/dL (ref 0.44–1.00)
GFR calc Af Amer: >60 mL/min (ref >60)
GFR calc non Af Amer: 55 mL/min — ABNORMAL LOW (ref >60)
Glucose, Bld: 98 mg/dL (ref 70–99)
Potassium: 4.1 mmol/L (ref 3.5–5.1)
Sodium: 133 mmol/L — ABNORMAL LOW (ref 135–145)
Total Bilirubin: 0.8 mg/dL (ref 0.3–1.2)
Total Protein: 6.6 g/dL (ref 6.5–8.1)

## 2020-03-05 LAB — URINALYSIS, ROUTINE W REFLEX MICROSCOPIC
Bilirubin Urine: NEGATIVE
Glucose, UA: NEGATIVE mg/dL
Hgb urine dipstick: NEGATIVE
Ketones, ur: 20 mg/dL — AB
Nitrite: NEGATIVE
Protein, ur: 30 mg/dL — AB
Specific Gravity, Urine: 1.033 — ABNORMAL HIGH (ref 1.005–1.030)
pH: 5 (ref 5.0–8.0)

## 2020-03-05 LAB — CBC WITH DIFFERENTIAL/PLATELET
Abs Immature Granulocytes: 0.03 10*3/uL (ref 0.00–0.07)
Basophils Absolute: 0.1 10*3/uL (ref 0.0–0.1)
Basophils Relative: 1 %
Eosinophils Absolute: 0 10*3/uL (ref 0.0–0.5)
Eosinophils Relative: 0 %
HCT: 41.4 % (ref 36.0–46.0)
Hemoglobin: 13.5 g/dL (ref 12.0–15.0)
Immature Granulocytes: 0 %
Lymphocytes Relative: 20 %
Lymphs Abs: 1.4 10*3/uL (ref 0.7–4.0)
MCH: 30.5 pg (ref 26.0–34.0)
MCHC: 32.6 g/dL (ref 30.0–36.0)
MCV: 93.7 fL (ref 80.0–100.0)
Monocytes Absolute: 0.6 10*3/uL (ref 0.1–1.0)
Monocytes Relative: 9 %
Neutro Abs: 4.7 10*3/uL (ref 1.7–7.7)
Neutrophils Relative %: 70 %
Platelets: 221 10*3/uL (ref 150–400)
RBC: 4.42 MIL/uL (ref 3.87–5.11)
RDW: 14.4 % (ref 11.5–15.5)
WBC: 6.8 10*3/uL (ref 4.0–10.5)
nRBC: 0 % (ref 0.0–0.2)

## 2020-03-05 LAB — LIPASE, BLOOD: Lipase: 37 U/L (ref 11–51)

## 2020-03-05 LAB — TROPONIN I (HIGH SENSITIVITY)
Troponin I (High Sensitivity): 32 ng/L — ABNORMAL HIGH (ref <18)
Troponin I (High Sensitivity): 29 ng/L — ABNORMAL HIGH (ref <18)

## 2020-03-05 LAB — LACTIC ACID, PLASMA: Lactic Acid, Venous: 1.1 mmol/L (ref 0.5–1.9)

## 2020-03-05 LAB — ETHANOL: Alcohol, Ethyl (B): <10 mg/dL (ref <10)

## 2020-03-05 LAB — AMMONIA: Ammonia: 17 umol/L (ref 9–35)

## 2020-03-05 MED ORDER — IOHEXOL 300 MG/ML  SOLN
100.0000 mL | Freq: Once | INTRAMUSCULAR | Status: AC | PRN
  Administered 2020-03-05: 100 mL via INTRAVENOUS

## 2020-03-05 MED ORDER — SODIUM CHLORIDE 0.9 % IV BOLUS
500.0000 mL | Freq: Once | INTRAVENOUS | Status: AC
  Administered 2020-03-05: 500 mL via INTRAVENOUS

## 2020-03-05 MED ORDER — NITROFURANTOIN MONOHYD MACRO 100 MG PO CAPS: 100.0000 mg | ORAL_CAPSULE | Freq: Two times a day (BID) | ORAL | 0 refills | Status: AC

## 2020-03-05 NOTE — Discharge Instructions (Signed)
Strongly recommend follow-up with your primary doctor regarding the symptoms you are experiencing today.  Take the antibiotic as prescribed.  If you develop worsening vomiting, fevers, worsening confusion or other new concerning symptom, return to ER for reassessment.

## 2020-03-05 NOTE — ED Provider Notes (Signed)
[facility] DEPT Provider Note   CSN: [id number] Arrival date & time: 03/05/20  1531     History Chief Complaint  Patient presents with  . Hypotension    Jacqueline Kim is a 74 y.o. female.  Presented to ER with concern for hypertension.  Patient reports over the last few weeks she has had generalized weakness, intermittent nausea and vomiting.  Reports that she was diagnosed with UTI completed course of antibiotics.  Due to her ongoing symptoms, she went back to urgent care facility.  While at urgent care, they documented low blood pressure in 37S systolic and recommended patient come to ER for reassessment.  At present, patient denies any ongoing medical complaints but does endorse generalized weakness and mild nausea.  History of paroxysmal A. fib, tachybradycardia syndrome s/p pacemaker placement.   States she is no longer on anticoagulation due to prior fall.   HPI     Past Medical History:  Diagnosis Date  . Anticoagulant long-term use  WITH PRADAXA 05/27/2011  . Anxiety   . Cellulitis of buttock, left 12/20/2014  . Complication of anesthesia    DIFFICULTY WAKING UP  . Depression   . Diverticulosis   . DJD (degenerative joint disease)   . Esophagitis 09/17/06  . External hemorrhoids   . GERD (gastroesophageal reflux disease)   . Glaucoma   . History of stress test    a. 2008 - normal nuc.  Marland Kitchen Hypertension   . Hypothyroid   . IBS (irritable bowel syndrome)   . Paroxysmal A-fib [facility]) diagnosed 2008   a. On propafenone, Pradaxa.  . 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 Hematoma of left thigh 01/21/2019  . Normocytic normochromic anemia 01/21/2019  . Hematoma 01/21/2019  . Facial laceration   . Rectal bleeding 02/01/2016  . Constipation 02/01/2016  . Dehydration 05/05/2015   . S/P left TKA 05/01/2015  . S/P knee replacement 05/01/2015  . Hypotension due to drugs 02/20/2015  . Cellulitis of left buttock 12/20/2014  . Cellulitis 12/20/2014  . AKI (acute kidney injury) ([place]) 12/20/2014  . Hyponatremia 12/20/2014  . Paroxysmal atrial fibrillation ([place]) 08/01/2014  . Irritable bowel syndrome 06/14/2014  . Fatigue 03/06/2014  . HTN (hypertension) 09/28/2013  . Atrial fibrillation with rapid ventricular response; paroxysmal 09/18/2013  . Chest pain 10/08/2012  . D-dimer, elevated, pt. on pradaxa 09/11/2011  . Chest pain, negative MI, resolved once heart rate slowed, secondary to rapid a. fib 09/11/2011  . Paroxysmal a-fib, recurrent 07/13/12- (on Propafenone)  05/27/2011  . Sinus node dysfunction ([place]) 05/27/2011  . Hypothyroid 05/27/2011  . 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 Anxiety 05/27/2011  . Chronic anticoagulation 05/27/2011    Past Surgical History:  Procedure Laterality Date  . BREAST REDUCTION SURGERY    . BUNIONECTOMY Right 07/09/2015   with rods and pins  . CARDIOVERSION N/A 08/02/2014   Procedure: CARDIOVERSION;  Surgeon: Sueanne Margarita, MD;  Location: [facility];  Service: Cardiovascular;  Laterality: N/A;  . CATARACT EXTRACTION, BILATERAL Bilateral   . FACIAL COSMETIC SURGERY    . FOOT SURGERY Left   . HAND SURGERY     multiple  . LIPOSUCTION    . NM MYOCAR PERF WALL MOTION  12/29/2006   No significant ischemia; EF 69%  . OVARIAN CYST REMOVAL    .  PACEMAKER INSERTION  04/08/11   MDT Revo implanted by Dr Recardo Evangelist  . TONSILLECTOMY    . TOTAL KNEE ARTHROPLASTY Left 05/01/2015   Procedure: TOTAL LEFT KNEE ARTHROPLASTY;  Surgeon: Paralee Cancel, MD;  Location: [facility];  Service: Orthopedics;  Laterality: Left;  Marland Kitchen VAGINAL HYSTERECTOMY       OB History   No obstetric history on file.     Family History  Problem Relation Age of Onset  . Anesthesia problems Mother   . Colon polyps Mother        beign  . Colon  cancer Neg Hx     Social History   Tobacco Use  . Smoking status: Former Smoker    Quit date: 08/30/1996    Years since quitting: 23.5  . Smokeless tobacco: Never Used  Substance Use Topics  . Alcohol use: Yes    Alcohol/week: 6.0 standard drinks    Types: 6 Glasses of wine per week    Comment: daily  . Drug use: No    Home Medications Prior to Admission medications   Medication Sig Start Date End Date Taking? Authorizing Provider  atenolol (TENORMIN) 25 MG tablet Take 1 tablet (25 mg total) by mouth 2 (two) times daily. 12/30/18   Troy Sine, MD  CARTIA XT 120 MG 24 hr capsule TAKE ONE CAPSULE BY MOUTH EVERY NIGHT AT BEDTIME 01/19/20   Croitoru, Mihai, MD  dicyclomine (BENTYL) 10 MG/5ML solution Take 10 mg by mouth 4 (four) times daily -  before meals and at bedtime.    [provider]  escitalopram (LEXAPRO) 20 MG tablet Take 10 mg by mouth daily.    [provider]  estradiol (ESTRACE) 0.5 MG tablet Take 0.5 mg by mouth daily.    [provider]  HYDROcodone-acetaminophen (NORCO/VICODIN) 5-325 MG tablet Take 1 tablet by mouth every 6 (six) hours as needed. 01/11/19   [provider]  hydroxychloroquine (PLAQUENIL) 200 MG tablet Take 200 mg by mouth daily.    [provider]  levothyroxine (SYNTHROID, LEVOTHROID) 112 MCG tablet Take 112 mcg by mouth daily before breakfast.     [provider]  linaclotide (LINZESS) 72 MCG capsule Take 1 capsule (72 mcg total) by mouth daily before breakfast. 03/11/16   Nandigam, Venia Minks, MD  nitrofurantoin, macrocrystal-monohydrate, (MACROBID) 100 MG capsule Take 1 capsule (100 mg total) by mouth 2 (two) times daily for 5 days. 03/05/20 03/10/20  Lucrezia Starch, MD  propafenone (RYTHMOL) 150 MG tablet TAKE ONE AND ONE-HALF TABLETS BY MOUTH EVERY MORNING, ONE TABLET BY MOUTH MID-DAY, AND ONE AND ONE-HALF TABLETS AT BEDTIME 09/23/19   Troy Sine, MD  traZODone (DESYREL) 150 MG tablet Take by  mouth at bedtime.    [provider]    Allergies    Azithromycin, Epinephrine, Penicillins, Lidocaine, and Metoprolol  Review of Systems   Review of Systems  Constitutional: Positive for fatigue. Negative for chills and fever.  HENT: Negative for ear pain and sore throat.   Eyes: Negative for pain and visual disturbance.  Respiratory: Negative for cough and shortness of breath.   Cardiovascular: Negative for chest pain and palpitations.  Gastrointestinal: Positive for nausea and vomiting. Negative for abdominal pain.  Genitourinary: Negative for dysuria and hematuria.  Musculoskeletal: Negative for arthralgias and back pain.  Skin: Negative for color change and rash.  Neurological: Positive for weakness. Negative for seizures and syncope.  All other systems reviewed and are negative.   Physical Exam Updated Vital  Signs BP 112/69   Pulse 60   Temp 98.1 F (36.7 C) (Oral)   Resp 14   Ht '5\' 4"'  (1.626 m)   Wt 56.2 kg   SpO2 94%   BMI 21.27 kg/m   Physical Exam Vitals and nursing note reviewed.  Constitutional:      General: She is not in acute distress.    Appearance: She is well-developed.  HENT:     Head: Normocephalic and atraumatic.  Eyes:     Conjunctiva/sclera: Conjunctivae normal.  Cardiovascular:     Rate and Rhythm: Normal rate and regular rhythm.     Heart sounds: No murmur heard.   Pulmonary:     Effort: Pulmonary effort is normal. No respiratory distress.     Breath sounds: Normal breath sounds.  Abdominal:     Comments: Generalized TTP, no rebound or guarding  Musculoskeletal:        General: No deformity or signs of injury.     Cervical back: Neck supple.  Skin:    General: Skin is warm and dry.  Neurological:     Mental Status: She is alert.     Comments: AAOx3 CN 2-12 intact, speech clear visual fields intact 5/5 strength in b/l UE and LE Sensation to light touch intact in b/l UE and LE Normal FNF     ED Results / Procedures /  Treatments   Labs (all labs ordered are listed, but only abnormal results are displayed) Labs Reviewed  COMPREHENSIVE METABOLIC PANEL - Abnormal; Notable for the following components:      Result Value   Sodium 133 (*)    Chloride 97 (*)    AST 54 (*)    ALT 54 (*)    GFR calc non Af Amer 55 (*)    All other components within normal limits  URINALYSIS, ROUTINE W REFLEX MICROSCOPIC - Abnormal; Notable for the following components:   Color, Urine AMBER (*)    Specific Gravity, Urine 1.033 (*)    Ketones, ur 20 (*)    Protein, ur 30 (*)    Leukocytes,Ua SMALL (*)    Bacteria, UA FEW (*)    All other components within normal limits  TROPONIN I (HIGH SENSITIVITY) - Abnormal; Notable for the following components:   Troponin I (High Sensitivity) 32 (*)    All other components within normal limits  TROPONIN I (HIGH SENSITIVITY) - Abnormal; Notable for the following components:   Troponin I (High Sensitivity) 29 (*)    All other components within normal limits  URINE CULTURE  CBC WITH DIFFERENTIAL/PLATELET  LIPASE, BLOOD  AMMONIA  LACTIC ACID, PLASMA  ETHANOL    EKG EKG Interpretation  Date/Time:  Monday March 05 2020 16:20:26 EDT Ventricular Rate:  61 PR Interval:    QRS Duration: 118 QT Interval:  485 QTC Calculation: 489 R Axis:   -34 Text Interpretation: Sinus or ectopic atrial rhythm Prolonged PR interval Left ventricular hypertrophy no acute STEMI Confirmed by Madalyn Rob [phone]) on 03/05/2020 4:52:18 PM   Radiology CT Head Wo Contrast  Result Date: 03/05/2020 CLINICAL DATA:  Intermittent encephalopathy, delirium. Ongoing nausea and vomiting with weakness. EXAM: CT HEAD WITHOUT CONTRAST TECHNIQUE: Contiguous axial images were obtained from the base of the skull through the vertex without intravenous contrast. COMPARISON:  01/20/2019 FINDINGS: Brain: No evidence of acute infarction, hemorrhage, hydrocephalus, extra-axial collection or mass lesion/mass effect.  Prominence of sulci and ventricles compatible with brain atrophy. There is mild diffuse low-attenuation within the subcortical  and periventricular white matter compatible with chronic microvascular disease. Vascular: No hyperdense vessel or unexpected calcification. Skull: Normal. Negative for fracture or focal lesion. Sinuses/Orbits: Mild mucosal thickening along the medial wall of bilateral maxillary sinuses. Mastoid air cells are clear. Other: None IMPRESSION: 1. No acute intracranial abnormalities. 2. Chronic small vessel ischemic disease and brain atrophy. Electronically Signed   By: Kerby Moors M.D.   On: 03/05/2020 19:14   CT ABDOMEN PELVIS W CONTRAST  Result Date: 03/05/2020 CLINICAL DATA:  Acute abdominal pain, nonlocalized. EXAM: CT ABDOMEN AND PELVIS WITH CONTRAST TECHNIQUE: Multidetector CT imaging of the abdomen and pelvis was performed using the standard protocol following bolus administration of intravenous contrast. CONTRAST:  111m OMNIPAQUE IOHEXOL 300 MG/ML  SOLN COMPARISON:  CT abdomen pelvis 01/24/2019. FINDINGS: Lower chest: No acute abnormality. Hepatobiliary: No focal liver abnormality is seen. No gallstones, gallbladder wall thickening, or biliary dilatation. The common bile duct is prominent in caliber but nonenlarged. Pancreas: Suggestion of pancreatic divisum anatomy. No pancreatic ductal dilatation or surrounding inflammatory changes. Spleen: Punctate calcifications likely sequela prior granulomatous disease. Otherwise normal in size without focal abnormality. A splenule is identified. Adrenals/Urinary Tract: No adrenal nodule bilaterally. Bilateral kidneys enhance symmetrically. No nephrolithiasis. No hydronephrosis. No hydroureter. The urinary bladder is unremarkable. Stomach/Bowel: Stomach is within normal limits. The appendix is not definitely identified (possible base or stump noted on coronal view (5:46). No evidence of bowel wall thickening, distention, or inflammatory  changes. Stool throughout the colon. Vascular/Lymphatic: No abdominal aorta or iliac aneurysm. To moderate calcified and noncalcified atherosclerotic plaque of the aorta and its branches. No abdominal, pelvic, or inguinal lymphadenopathy. Reproductive: Status post hysterectomy. Bilateral ovaries are unremarkable. No adnexal masses. Other: No intraperitoneal free fluid. No intraperitoneal free gas. No organized fluid collection. Musculoskeletal: No abdominal wall hernia or abnormality Dextroscoliosis of the lumbar spine centered at the L2-L3 level with 1 cm rightward displacement of the L3 vertebral body in relation to the L4 vertebral body (5:56). Associated multilevel moderate to severe degenerative changes of spine. No acute displaced fracture. No suspicious lytic or blastic osseous lesions. IMPRESSION: 1. Constipation with no finding to suggests colitis. 2. Otherwise no acute intra-abdominal intrapelvic abnormality. 3. Other imaging findings of potential clinical significance: Suggestion of pancreatic divisum anatomy. Dextroscoliosis of the lumbar spine with severe degenerative changes. Aortic Atherosclerosis (ICD10-I70.0). Electronically Signed   By: MIven FinnM.D.   On: 03/05/2020 19:29   DG Chest Portable 1 View  Result Date: 03/05/2020 CLINICAL DATA:  Chest pain with ongoing nausea, vomiting, weakness and hypotension. Recent urinary tract infections. EXAM: PORTABLE CHEST 1 VIEW COMPARISON:  Radiograph 11/18/2018 and 11/25/2016. FINDINGS: 1605 hours. Left subclavian AV sequential pacemaker leads are unchanged, projecting over the right atrium and right ventricle. The heart size and mediastinal contours are stable with aortic atherosclerosis. The lungs are clear. There is no pleural effusion or pneumothorax. No acute osseous findings. Telemetry leads overlie the chest. IMPRESSION: Stable chest.  No active cardiopulmonary process. Electronically Signed   By: WRichardean SaleM.D.   On: 03/05/2020 16:19     Procedures Procedures (including critical care time)  Medications Ordered in ED Medications  sodium chloride 0.9 % bolus 500 mL (0 mLs Intravenous Stopped 03/05/20 1811)  iohexol (OMNIPAQUE) 300 MG/ML solution 100 mL (100 mLs Intravenous Contrast Given 03/05/20 1847)    ED Course  I have reviewed the triage vital signs and the nursing notes.  Pertinent labs & imaging results that were available during my care of the  patient were reviewed by me and considered in my medical decision making (see chart for details).  Clinical Course as of Mar 05 2050  Mon Mar 05, 2020  1751 D/w daughter, obtained additional history   [RD]    Clinical Course User Index [RD] Lucrezia Starch, MD   MDM Rules/Calculators/A&P                         74 year old lady presenting to ER with concern for intermittent episodes of confusion, nausea, low blood pressure.  On arrival to our ER, patient is well-appearing in no acute distress.  Her blood pressure was stable.  Reportedly low at urgent care.  Given her myriad symptoms, commenced broad work-up.  Noted some abdominal tenderness on exam, labs and CT abdomen pelvis were obtained.  Negative for acute pathology.  UA demonstrated likely UTI.  Given the reported intermittent confusion, checked CT head.  CT head negative for acute pathology.  Given nonfocal neurologic exam and these findings and patient at baseline currently, believe she is stable for discharge from this regard. She is nontoxic and her BP remained normal throughout ER stay. Will prescribe a course of nitrofurantoin.  Recommend close follow-up with primary doctor.  Reviewed case with patient's daughter as well.   Final Clinical Impression(s) / ED Diagnoses Final diagnoses:  Urinary tract infection without hematuria, site unspecified  Intermittent confusion    Rx / DC Orders ED Discharge Orders         Ordered    nitrofurantoin, macrocrystal-monohydrate, (MACROBID) 100 MG capsule  2 times  daily        03/05/20 2048           Lucrezia Starch, MD 03/05/20 2051

## 2020-03-05 NOTE — ED Triage Notes (Signed)
Pt came from urgent care via EMS. C/C Hypotension and ongoing N/V and weakness (2 wks)  PMH: UTI dx 2 wks, finished cipro 1 wk ago. Afebrile. Became hypotensive @ urgent care facility (80/40) while sitting up. 90's-low 100's while lying down with EMS.  Atrial pacemaker.  A&O x4.

## 2020-03-07 LAB — URINE CULTURE: Culture: >=100000 — AB

## 2020-03-08 ENCOUNTER — Telehealth: Payer: Self-pay | Admitting: *Deleted

## 2020-03-08 NOTE — Telephone Encounter (Signed)
Post ED Visit - Positive Culture Follow-up  Culture report reviewed by antimicrobial stewardship pharmacist: [facility] []  , Pharm.D. []  Enzo Bi, Pharm.D., BCPS AQ-ID []  , Pharm.D., BCPS []  Celedonio Miyamoto, Pharm.D., BCPS []  [place], Garvin Fila.D., BCPS, AAHIVP []  , Pharm.D., BCPS, AAHIVP []  Georgina Pillion, PharmD, BCPS []  , PharmD, BCPS []  [place], PharmD, BCPS []  [place], PharmD []  , PharmD, BCPS []  Estella Husk, PharmD  Pharmacy Team []  Lysle Pearl, PharmD [x]  , PharmD []  Phillips Climes, PharmD []  , Rph []  Agapito Games) , PharmD []  Verlan Friends, PharmD []  , PharmD []  Mervyn Gay, PharmD []  , PharmD []  Vinnie Level, PharmD []  W[facility], PharmD []  , PharmD []  Len Childs, PharmD   Positive urine culture Treated with Nitrofurantoin Monohyd Macro, organism sensitive to the same and no further patient follow-up is required at this time.  [facility] 03/08/2020, 12:09 PM

## 2020-03-09 ENCOUNTER — Other Ambulatory Visit (HOSPITAL_COMMUNITY): Payer: Self-pay | Admitting: Internal Medicine

## 2020-03-26 ENCOUNTER — Encounter: Payer: Medicare Other | Admitting: Cardiovascular Disease

## 2020-04-05 ENCOUNTER — Other Ambulatory Visit (HOSPITAL_COMMUNITY): Payer: Self-pay | Admitting: Internal Medicine

## 2020-04-16 ENCOUNTER — Other Ambulatory Visit (HOSPITAL_COMMUNITY): Payer: Self-pay | Admitting: Internal Medicine

## 2020-04-27 ENCOUNTER — Other Ambulatory Visit (HOSPITAL_COMMUNITY): Payer: Self-pay | Admitting: Internal Medicine

## 2020-04-30 ENCOUNTER — Ambulatory Visit (INDEPENDENT_AMBULATORY_CARE_PROVIDER_SITE_OTHER): Payer: Medicare Other

## 2020-04-30 DIAGNOSIS — I495 Sick sinus syndrome: Secondary | ICD-10-CM

## 2020-04-30 LAB — CUP PACEART REMOTE DEVICE CHECK
Battery Voltage: 2.9 V
Brady Statistic AP VP Percent: 0.15 %
Brady Statistic AP VS Percent: 80.9 %
Brady Statistic AS VP Percent: 0.08 %
Brady Statistic AS VS Percent: 18.87 %
Brady Statistic RA Percent Paced: 79.08 %
Brady Statistic RV Percent Paced: 0.21 %
Date Time Interrogation Session: 20211114164947
Implantable Lead Implant Date: 20121023
Implantable Lead Implant Date: 20121023
Implantable Lead Location: 753859
Implantable Lead Location: 753860
Implantable Pulse Generator Implant Date: 20121023
Lead Channel Impedance Value: 1456 Ohm
Lead Channel Impedance Value: 408 Ohm
Lead Channel Sensing Intrinsic Amplitude: 2.391 mV
Lead Channel Sensing Intrinsic Amplitude: 9.037 mV
Lead Channel Setting Pacing Amplitude: 2 V
Lead Channel Setting Pacing Amplitude: 2.5 V
Lead Channel Setting Pacing Pulse Width: 0.6 ms
Lead Channel Setting Sensing Sensitivity: 0.9 mV

## 2020-05-01 NOTE — Progress Notes (Signed)
Remote pacemaker transmission.   

## 2020-05-02 ENCOUNTER — Emergency Department (HOSPITAL_COMMUNITY): Payer: Medicare Other

## 2020-05-02 ENCOUNTER — Emergency Department (HOSPITAL_COMMUNITY)
Admission: EM | Admit: 2020-05-02 | Discharge: 2020-05-02 | Disposition: A | Discharge status: Home / Self Care | Payer: Medicare Other | Attending: Emergency Medicine | Admitting: Emergency Medicine

## 2020-05-02 ENCOUNTER — Other Ambulatory Visit (HOSPITAL_COMMUNITY): Payer: Self-pay | Admitting: Student

## 2020-05-02 ENCOUNTER — Encounter (HOSPITAL_COMMUNITY): Payer: Self-pay

## 2020-05-02 ENCOUNTER — Other Ambulatory Visit: Payer: Self-pay

## 2020-05-02 DIAGNOSIS — F0390 Unspecified dementia without behavioral disturbance: Secondary | ICD-10-CM | POA: Diagnosis not present

## 2020-05-02 DIAGNOSIS — Z79899 Other long term (current) drug therapy: Secondary | ICD-10-CM | POA: Diagnosis not present

## 2020-05-02 DIAGNOSIS — S0990XA Unspecified injury of head, initial encounter: Secondary | ICD-10-CM | POA: Diagnosis present

## 2020-05-02 DIAGNOSIS — I10 Essential (primary) hypertension: Secondary | ICD-10-CM | POA: Insufficient documentation

## 2020-05-02 DIAGNOSIS — Z950 Presence of cardiac pacemaker: Secondary | ICD-10-CM | POA: Insufficient documentation

## 2020-05-02 DIAGNOSIS — E039 Hypothyroidism, unspecified: Secondary | ICD-10-CM | POA: Diagnosis not present

## 2020-05-02 DIAGNOSIS — M79645 Pain in left finger(s): Secondary | ICD-10-CM | POA: Diagnosis not present

## 2020-05-02 DIAGNOSIS — W01198A Fall on same level from slipping, tripping and stumbling with subsequent striking against other object, initial encounter: Secondary | ICD-10-CM | POA: Diagnosis not present

## 2020-05-02 DIAGNOSIS — Z87891 Personal history of nicotine dependence: Secondary | ICD-10-CM | POA: Diagnosis not present

## 2020-05-02 DIAGNOSIS — Y92002 Bathroom of unspecified non-institutional (private) residence single-family (private) house as the place of occurrence of the external cause: Secondary | ICD-10-CM | POA: Insufficient documentation

## 2020-05-02 DIAGNOSIS — M25512 Pain in left shoulder: Secondary | ICD-10-CM | POA: Insufficient documentation

## 2020-05-02 DIAGNOSIS — S0181XA Laceration without foreign body of other part of head, initial encounter: Secondary | ICD-10-CM | POA: Insufficient documentation

## 2020-05-02 DIAGNOSIS — Z96652 Presence of left artificial knee joint: Secondary | ICD-10-CM | POA: Insufficient documentation

## 2020-05-02 DIAGNOSIS — N3000 Acute cystitis without hematuria: Secondary | ICD-10-CM

## 2020-05-02 DIAGNOSIS — M25552 Pain in left hip: Secondary | ICD-10-CM | POA: Insufficient documentation

## 2020-05-02 DIAGNOSIS — W19XXXA Unspecified fall, initial encounter: Secondary | ICD-10-CM

## 2020-05-02 LAB — URINALYSIS, ROUTINE W REFLEX MICROSCOPIC
Bilirubin Urine: NEGATIVE
Glucose, UA: NEGATIVE mg/dL
Hgb urine dipstick: NEGATIVE
Ketones, ur: 5 mg/dL — AB
Nitrite: NEGATIVE
Protein, ur: 30 mg/dL — AB
Specific Gravity, Urine: 1.018 (ref 1.005–1.030)
pH: 5 (ref 5.0–8.0)

## 2020-05-02 LAB — CBC WITH DIFFERENTIAL/PLATELET
Abs Immature Granulocytes: 0.04 10*3/uL (ref 0.00–0.07)
Basophils Absolute: 0 10*3/uL (ref 0.0–0.1)
Basophils Relative: 0 %
Eosinophils Absolute: 0 10*3/uL (ref 0.0–0.5)
Eosinophils Relative: 0 %
HCT: 38.6 % (ref 36.0–46.0)
Hemoglobin: 12.5 g/dL (ref 12.0–15.0)
Immature Granulocytes: 0 %
Lymphocytes Relative: 14 %
Lymphs Abs: 1.5 10*3/uL (ref 0.7–4.0)
MCH: 31.6 pg (ref 26.0–34.0)
MCHC: 32.4 g/dL (ref 30.0–36.0)
MCV: 97.7 fL (ref 80.0–100.0)
Monocytes Absolute: 1 10*3/uL (ref 0.1–1.0)
Monocytes Relative: 10 %
Neutro Abs: 7.8 10*3/uL — ABNORMAL HIGH (ref 1.7–7.7)
Neutrophils Relative %: 76 %
Platelets: 254 10*3/uL (ref 150–400)
RBC: 3.95 MIL/uL (ref 3.87–5.11)
RDW: 15.1 % (ref 11.5–15.5)
WBC: 10.4 10*3/uL (ref 4.0–10.5)
nRBC: 0 % (ref 0.0–0.2)

## 2020-05-02 LAB — BASIC METABOLIC PANEL
Anion gap: 13 (ref 5–15)
BUN: 18 mg/dL (ref 8–23)
CO2: 23 mmol/L (ref 22–32)
Calcium: 9.5 mg/dL (ref 8.9–10.3)
Chloride: 96 mmol/L — ABNORMAL LOW (ref 98–111)
Creatinine, Ser: 0.96 mg/dL (ref 0.44–1.00)
GFR, Estimated: >60 mL/min (ref >60)
Glucose, Bld: 89 mg/dL (ref 70–99)
Potassium: 4.3 mmol/L (ref 3.5–5.1)
Sodium: 132 mmol/L — ABNORMAL LOW (ref 135–145)

## 2020-05-02 MED ORDER — SULFAMETHOXAZOLE-TRIMETHOPRIM 800-160 MG PO TABS: 1.0000 | ORAL_TABLET | Freq: Two times a day (BID) | ORAL | 0 refills | Status: DC

## 2020-05-02 MED ORDER — SULFAMETHOXAZOLE-TRIMETHOPRIM 800-160 MG PO TABS
1.0000 | ORAL_TABLET | Freq: Once | ORAL | Status: AC
  Administered 2020-05-02: 1 via ORAL
  Filled 2020-05-02: qty 1

## 2020-05-02 MED ORDER — BACITRACIN ZINC 500 UNIT/GM EX OINT
TOPICAL_OINTMENT | CUTANEOUS | Status: AC
  Filled 2020-05-02: qty 0.9

## 2020-05-02 MED ORDER — OXYCODONE-ACETAMINOPHEN 5-325 MG PO TABS
1.0000 | ORAL_TABLET | Freq: Once | ORAL | Status: AC
  Administered 2020-05-02: 1 via ORAL
  Filled 2020-05-02: qty 1

## 2020-05-02 NOTE — ED Provider Notes (Signed)
  Face-to-face evaluation   History: Elderly female, standing up from commode and fell.  She thinks she felt dizzy, and may have tripped on something.  She injured her head but denies headache or neck pain. Physical exam: Elderly, alert and cooperative.  Head with contusion and abrasion, left forehead.  No dysarthria or aphasia.  Normal range of motion arms and legs bilaterally.   Medical screening examination/treatment/procedure(s) were conducted as a shared visit with non-physician practitioner(s) and myself.  I personally evaluated the patient during the encounter    Mancel Bale, MD 05/02/20 2329

## 2020-05-02 NOTE — ED Notes (Signed)
Pt transferred to CT.

## 2020-05-02 NOTE — ED Notes (Signed)
Call received from pt daughter Laural Benes [phone] requesting to be contacted when pt is ready to be discharged- will be picking pt up. [employer]

## 2020-05-02 NOTE — Discharge Instructions (Signed)
You were evaluated in the emergency department today after your fall at home.  Vital signs, physical exam, and imaging studies were very reassuring.  There is no sign of bleeding in your brain, there are no broken bones in your head, face, left shoulder, pelvis, left hand.  Your EKG and blood work were very reassuring.  Your urine test showed a urinary tract infection.  You were administered 1 dose of antibiotics in the emergency department, and I prescribed you an antibiotic to take at home for the next 3 days.  Please follow-up with your primary care doctor in 1 week for recheck of your urine, to ensure that the infection has passed.  Regarding your fall, the cut in your forehead will heal on its own, you may utilize Tylenol as needed for your pain.  Return to the emergency department if you develop any confusion, blurry vision, double vision, chest pain, difficulty breathing, or other new severe symptoms.

## 2020-05-02 NOTE — ED Triage Notes (Signed)
Pt BIB EMS from home. Pt reports falling in the bathroom when standing up from the toilet and hit her head on the floor. Denies LOC. Pt has lac to left forehead. Bleeding controlled at this time. Pt endorses upper back and left thumb pain. Denies currently taking blood thinners. A&O x4.

## 2020-05-02 NOTE — ED Provider Notes (Signed)
[facility]-EMERGENCY DEPT Provider Note   CSN: [id number] Arrival date & time: 05/02/20  1403     History Chief Complaint  Patient presents with  . Fall  . Head Injury    Jacqueline Kim is a 74 y.o. female who presents after fall at home. She states that she was sitting on the toilet, went to stand up and fell to the ground, hitting her forehead, left shoulder, left hip on the ceramic tile floor. She was able to ambulate to her phone independently to call an ambulance. She endorse blurry vision, headache after the fall. Denies double vision, lightheadedness, dizziness, nausea, vomiting, confusion.  She endorses pain in her left shoulder, left thumb, left hip. She is also concerned about the amount of blood that has been in her hair, requesting we are able to wash her hair while she is here.  Patient endorses fall earlier last year when she was on anticoagulation, at that time she was admitted for thigh hematoma No brain bleed at that time. She was subsequently discontinued from anticoagulation.  She denies dysuria, urinary frequency, urinary urgency, congestion, shortness of breath, chest pain.  I have personally reviewed this patient's medical records. She has history of A. fib, hypothyroidism, anxiety, presence of cardiac pacemaker, diverticulosis, GERD, IBS, spinal stenosis, mild dementia. She is no longer on chronic anticoagulation.   Collateral history obtained from daughter [name] over the phone, who confirmed that patient has history of spinal stenosis and secondary abnormal gait due to this issue.  Additionally patient recently diagnosed with mild dementia, and according to daughters who saw her this morning patient is at her baseline.  Patient was diagnosed with urinary tract infection in September 2021, treated with Macrobid, had some confusion on presentation, resolved after antibiotic therapy.  HPI     Past Medical History:  Diagnosis Date  .  Anticoagulant long-term use  WITH PRADAXA 05/27/2011  . Anxiety   . Cellulitis of buttock, left 12/20/2014  . Complication of anesthesia    DIFFICULTY WAKING UP  . Depression   . Diverticulosis   . DJD (degenerative joint disease)   . Esophagitis 09/17/06  . External hemorrhoids   . GERD (gastroesophageal reflux disease)   . Glaucoma   . History of stress test    a. 2008 - normal nuc.  Marland Kitchen Hypertension   . Hypothyroid   . IBS (irritable bowel syndrome)   . Paroxysmal A-fib [facility]) diagnosed 2008   a. On propafenone, Pradaxa.  . 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 Hematoma of left thigh 01/21/2019  . Normocytic normochromic anemia 01/21/2019  . Hematoma 01/21/2019  . Facial laceration   . Rectal bleeding 02/01/2016  . Constipation 02/01/2016  . Dehydration 05/05/2015  . S/P left TKA 05/01/2015  . S/P knee replacement 05/01/2015  . Hypotension due to drugs 02/20/2015  . Cellulitis of left buttock 12/20/2014  . Cellulitis 12/20/2014  . AKI (acute kidney injury) (HCC) 12/20/2014  . Hyponatremia 12/20/2014  . Paroxysmal atrial fibrillation (HCC) 08/01/2014  . Irritable bowel syndrome 06/14/2014  . Fatigue 03/06/2014  . HTN (hypertension) 09/28/2013  . Atrial fibrillation with rapid ventricular response; paroxysmal 09/18/2013  . Chest pain 10/08/2012  . D-dimer, elevated, pt. on pradaxa 09/11/2011  . Chest pain, negative MI, resolved once heart rate slowed, secondary to  rapid a. fib 09/11/2011  . Paroxysmal a-fib, recurrent 07/13/12- (on Propafenone)  05/27/2011  . Sinus node dysfunction (HCC) 05/27/2011  . Hypothyroid 05/27/2011  . 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 Anxiety 05/27/2011  . Chronic anticoagulation 05/27/2011    Past Surgical History:  Procedure Laterality Date  . BREAST  REDUCTION SURGERY    . BUNIONECTOMY Right 07/09/2015   with rods and pins  . CARDIOVERSION N/A 08/02/2014   Procedure: CARDIOVERSION;  Surgeon: Quintella Reichert, MD;  Location: [facility];  Service: Cardiovascular;  Laterality: N/A;  . CATARACT EXTRACTION, BILATERAL Bilateral   . FACIAL COSMETIC SURGERY    . FOOT SURGERY Left   . HAND SURGERY     multiple  . LIPOSUCTION    . NM MYOCAR PERF WALL MOTION  12/29/2006   No significant ischemia; EF 69%  . OVARIAN CYST REMOVAL    . PACEMAKER INSERTION  04/08/11   MDT Revo implanted by Dr Rubie Maid  . TONSILLECTOMY    . TOTAL KNEE ARTHROPLASTY Left 05/01/2015   Procedure: TOTAL LEFT KNEE ARTHROPLASTY;  Surgeon: Durene Romans, MD;  Location: [facility];  Service: Orthopedics;  Laterality: Left;  Marland Kitchen VAGINAL HYSTERECTOMY       OB History   No obstetric history on file.     Family History  Problem Relation Age of Onset  . Anesthesia problems Mother   . Colon polyps Mother        beign  . Colon cancer Neg Hx     Social History   Tobacco Use  . Smoking status: Former Smoker    Quit date: 08/30/1996    Years since quitting: 23.6  . Smokeless tobacco: Never Used  Substance Use Topics  . Alcohol use: Yes    Alcohol/week: 6.0 standard drinks    Types: 6 Glasses of wine per week    Comment: daily  . Drug use: No    Home Medications Prior to Admission medications   Medication Sig Start Date End Date Taking? Authorizing Provider  atenolol (TENORMIN) 25 MG tablet Take 1 tablet (25 mg total) by mouth 2 (two) times daily. 12/30/18  Yes [name], MD  CARTIA XT 120 MG 24 hr capsule TAKE ONE CAPSULE BY MOUTH EVERY NIGHT AT BEDTIME 01/19/20  Yes Croitoru, Mihai, MD  HYDROcodone-acetaminophen (NORCO/VICODIN) 5-325 MG tablet Take 1 tablet by mouth every 6 (six) hours as needed. 01/11/19  Yes [provider]  hydroxychloroquine (PLAQUENIL) 200 MG tablet Take 200 mg by mouth daily.   Yes [provider]  levothyroxine (SYNTHROID,  LEVOTHROID) 112 MCG tablet Take 112 mcg by mouth daily before breakfast.    Yes [provider]  propafenone (RYTHMOL) 150 MG tablet TAKE ONE AND ONE-HALF TABLETS BY MOUTH EVERY MORNING, ONE TABLET BY MOUTH MID-DAY, AND ONE AND ONE-HALF TABLETS AT BEDTIME 09/23/19  Yes [name], MD  traZODone (DESYREL) 150 MG tablet Take by mouth at bedtime.   Yes [provider]  linaclotide (LINZESS) 72 MCG capsule Take 1 capsule (72 mcg total) by mouth daily before breakfast. Patient not taking: Reported on 05/02/2020 03/11/16   Napoleon Form, MD  sulfamethoxazole-trimethoprim (BACTRIM DS) 800-160 MG tablet Take 1 tablet by mouth 2 (two) times daily for 3 days. 05/02/20 05/05/20  Sayyid Harewood, Lupe Carney R, PA-C    Allergies    Azithromycin, Epinephrine, Penicillins, Lidocaine, and Metoprolol  Review of Systems   Review of Systems  Constitutional: Negative for activity change, appetite change, chills, fatigue and  fever.  HENT: Negative.   Eyes: Positive for visual disturbance.       Blurry vision.  Respiratory: Negative for cough, choking, shortness of breath and wheezing.   Cardiovascular: Negative for chest pain, palpitations and leg swelling.  Gastrointestinal: Negative for abdominal pain, nausea and vomiting.  Endocrine: Negative.   Genitourinary: Positive for pelvic pain. Negative for dysuria, flank pain, hematuria and urgency.  Musculoskeletal: Positive for joint swelling, myalgias and neck pain. Negative for neck stiffness.  Hematological: Negative.   Psychiatric/Behavioral: Negative for confusion.    Physical Exam Updated Vital Signs BP 133/89   Pulse 60   Temp 97.9 F (36.6 C) (Oral)   Resp 13   SpO2 97%   Physical Exam Vitals and nursing note reviewed.  Constitutional:      General: She is not in acute distress.    Appearance: She is not ill-appearing.  HENT:     Head: Normocephalic. Laceration present.      Right Ear: Tympanic membrane normal. No  hemotympanum.     Left Ear: Tympanic membrane normal. No hemotympanum.     Nose: Nose normal.     Mouth/Throat:     Mouth: Mucous membranes are moist.     Pharynx: Oropharynx is clear. Uvula midline. No oropharyngeal exudate or posterior oropharyngeal erythema.     Tonsils: No tonsillar exudate.  Eyes:     General: Lids are normal. Vision grossly intact.        Right eye: No discharge.        Left eye: No discharge.     Extraocular Movements: Extraocular movements intact.     Conjunctiva/sclera: Conjunctivae normal.     Pupils: Pupils are equal, round, and reactive to light.  Neck:     Trachea: Trachea and phonation normal.  Cardiovascular:     Rate and Rhythm: Normal rate and regular rhythm.     Pulses: Normal pulses.          Radial pulses are 2+ on the right side and 2+ on the left side.       Dorsalis pedis pulses are 2+ on the right side and 2+ on the left side.     Heart sounds: Normal heart sounds. No murmur heard.   Pulmonary:     Effort: Pulmonary effort is normal. No respiratory distress.     Breath sounds: Normal breath sounds. No wheezing or rales.  Chest:     Chest wall: No swelling, tenderness, crepitus or edema.    Abdominal:     General: Bowel sounds are normal. There is no distension.     Palpations: Abdomen is soft.     Tenderness: There is no abdominal tenderness.  Musculoskeletal:        General: No deformity. Normal range of motion.     Right shoulder: Normal.     Left shoulder: Tenderness and bony tenderness present. Normal range of motion.     Right upper arm: Normal.     Left upper arm: Normal.     Right elbow: Normal.     Left elbow: Normal.     Right forearm: Normal.     Left forearm: Normal.     Right wrist: Normal.     Left wrist: Normal. No snuff box tenderness.     Right hand: Normal.     Left hand: Swelling, tenderness and bony tenderness present. Normal strength. Normal pulse.       Arms:       Hands:  Cervical back: Neck supple.  Tenderness and bony tenderness present. No rigidity, spasms or crepitus. Spinous process tenderness and muscular tenderness present. No pain with movement.     Thoracic back: Normal.     Lumbar back: Normal.     Right hip: Tenderness and bony tenderness present. No crepitus.     Left hip: Tenderness and bony tenderness present. No crepitus.     Right upper leg: Normal.     Left upper leg: Tenderness and bony tenderness present.     Right knee: Normal.     Left knee: Normal.     Right lower leg: Normal. No edema.     Left lower leg: Normal. No edema.     Right ankle: Normal.     Left ankle: Normal.     Left Achilles Tendon: Normal.     Right foot: Normal.     Left foot: Normal.       Legs:     Comments: FROM of the shoulders, elbows, wrists bilaterally. Symmetric 4/5 grip strength bilaterally.   Lymphadenopathy:     Cervical: No cervical adenopathy.  Skin:    General: Skin is warm and dry.     Capillary Refill: Capillary refill takes less than 2 seconds.     Findings: Bruising present.  Neurological:     General: No focal deficit present.     Mental Status: She is alert and oriented to person, place, and time. Mental status is at baseline.     Cranial Nerves: Cranial nerves are intact.     Sensory: Sensation is intact.     Motor: Motor function is intact.     Coordination: Coordination is intact.  Psychiatric:        Mood and Affect: Mood normal.     ED Results / Procedures / Treatments   Labs (all labs ordered are listed, but only abnormal results are displayed) Labs Reviewed  URINALYSIS, ROUTINE W REFLEX MICROSCOPIC - Abnormal; Notable for the following components:      Result Value   Color, Urine AMBER (*)    APPearance CLOUDY (*)    Ketones, ur 5 (*)    Protein, ur 30 (*)    Leukocytes,Ua TRACE (*)    Bacteria, UA MANY (*)    All other components within normal limits  CBC WITH DIFFERENTIAL/PLATELET - Abnormal; Notable for the following components:   Neutro Abs  7.8 (*)    All other components within normal limits  BASIC METABOLIC PANEL - Abnormal; Notable for the following components:   Sodium 132 (*)    Chloride 96 (*)    All other components within normal limits  URINE CULTURE    EKG EKG Interpretation  Date/Time:  Wednesday May 02 2020 18:04:05 EST Ventricular Rate:  61 PR Interval:    QRS Duration: 123 QT Interval:  419 QTC Calculation: 422 R Axis:   -32 Text Interpretation: Sinus or ectopic atrial rhythm Prolonged PR interval Left ventricular hypertrophy Anterior Q waves, possibly due to LVH ST elevation, consider inferior injury since last tracing no significant change Confirmed by Mancel Bale [phone]) on 05/02/2020 8:41:30 PM   Radiology CT Head Wo Contrast  Result Date: 05/02/2020 CLINICAL DATA:  Head trauma, minor. Neck trauma. Facial trauma. Additional provided: Fall hitting head on floor, laceration to left forehead, patient reports upper back and left thumb pain, currently taking blood thinners. EXAM: CT HEAD WITHOUT CONTRAST CT MAXILLOFACIAL WITHOUT CONTRAST CT CERVICAL SPINE WITHOUT CONTRAST TECHNIQUE: Multidetector CT imaging of  the head, cervical spine, and maxillofacial structures were performed using the standard protocol without intravenous contrast. Multiplanar CT image reconstructions of the cervical spine and maxillofacial structures were also generated. COMPARISON:  Head CT 03/05/2020. CT cervical spine and maxillofacial 01/20/2019. FINDINGS: CT HEAD FINDINGS Brain: Mild generalized cerebral atrophy. Mild ill-defined hypoattenuation within the cerebral white matter is nonspecific, but compatible with chronic small vessel ischemic disease. There is no acute intracranial hemorrhage. No demarcated cortical infarct. No extra-axial fluid collection. No evidence of intracranial mass. No midline shift. Vascular: No hyperdense vessel.  Atherosclerotic calcifications. Skull: Normal. Negative for fracture or focal lesion. CT  MAXILLOFACIAL FINDINGS Osseous: No acute maxillofacial fracture is identified. Orbits: No acute finding. The globes are normal in size and contour. The extraocular muscles and optic nerve sheath complexes are symmetric and unremarkable. Sinuses: Small right maxillary sinus mucous retention cyst. Mild left maxillary sinus mucosal thickening. Soft tissues: Left frontal scalp/forehead soft tissue swelling. CT CERVICAL SPINE FINDINGS Alignment: 2 mm C3-C4 grade 1 anterolisthesis, unchanged. 4 mm C4-C5 grade 1 anterolisthesis, unchanged. Skull base and vertebrae: The basion-dental and atlanto-dental intervals are maintained.No evidence of acute fracture to the cervical spine. Congenital nonunion of the posterior arch of C1. Soft tissues and spinal canal: No prevertebral fluid or swelling. No visible canal hematoma. Disc levels: Cervical spondylosis with multilevel disc bulges, posterior disc osteophytes, uncovertebral hypertrophy and facet arthrosis. Disc space narrowing is severe at C5-C6, C6-C7, C7-T1 and T1-T2. Upper chest: No consolidation within the imaged lung apices. No visible pneumothorax. Other: Atherosclerotic plaque within the carotid bifurcations. IMPRESSION: CT head: 1. No evidence of acute intracranial abnormality. 2. Mild cerebral atrophy and chronic small vessel ischemic disease. CT maxillofacial: 1. No acute maxillofacial fracture is identified. 2. Left anterior scalp/forehead soft tissue swelling. 3. Small right maxillary sinus mucous retention cyst. 4. Mild left maxillary sinus mucosal thickening. CT cervical spine: 1. No evidence of acute fracture to the cervical spine 2. 2 mm C3-C4 grade 1 anterolisthesis, unchanged. 3. 4 mm C4-C5 grade 1 anterolisthesis, unchanged. 4. Cervical spondylosis as described. Electronically Signed   By: Jackey Loge DO   On: 05/02/2020 17:39   CT Cervical Spine Wo Contrast  Result Date: 05/02/2020 CLINICAL DATA:  Head trauma, minor. Neck trauma. Facial trauma.  Additional provided: Fall hitting head on floor, laceration to left forehead, patient reports upper back and left thumb pain, currently taking blood thinners. EXAM: CT HEAD WITHOUT CONTRAST CT MAXILLOFACIAL WITHOUT CONTRAST CT CERVICAL SPINE WITHOUT CONTRAST TECHNIQUE: Multidetector CT imaging of the head, cervical spine, and maxillofacial structures were performed using the standard protocol without intravenous contrast. Multiplanar CT image reconstructions of the cervical spine and maxillofacial structures were also generated. COMPARISON:  Head CT 03/05/2020. CT cervical spine and maxillofacial 01/20/2019. FINDINGS: CT HEAD FINDINGS Brain: Mild generalized cerebral atrophy. Mild ill-defined hypoattenuation within the cerebral white matter is nonspecific, but compatible with chronic small vessel ischemic disease. There is no acute intracranial hemorrhage. No demarcated cortical infarct. No extra-axial fluid collection. No evidence of intracranial mass. No midline shift. Vascular: No hyperdense vessel.  Atherosclerotic calcifications. Skull: Normal. Negative for fracture or focal lesion. CT MAXILLOFACIAL FINDINGS Osseous: No acute maxillofacial fracture is identified. Orbits: No acute finding. The globes are normal in size and contour. The extraocular muscles and optic nerve sheath complexes are symmetric and unremarkable. Sinuses: Small right maxillary sinus mucous retention cyst. Mild left maxillary sinus mucosal thickening. Soft tissues: Left frontal scalp/forehead soft tissue swelling. CT CERVICAL SPINE FINDINGS Alignment: 2 mm C3-C4  grade 1 anterolisthesis, unchanged. 4 mm C4-C5 grade 1 anterolisthesis, unchanged. Skull base and vertebrae: The basion-dental and atlanto-dental intervals are maintained.No evidence of acute fracture to the cervical spine. Congenital nonunion of the posterior arch of C1. Soft tissues and spinal canal: No prevertebral fluid or swelling. No visible canal hematoma. Disc levels:  Cervical spondylosis with multilevel disc bulges, posterior disc osteophytes, uncovertebral hypertrophy and facet arthrosis. Disc space narrowing is severe at C5-C6, C6-C7, C7-T1 and T1-T2. Upper chest: No consolidation within the imaged lung apices. No visible pneumothorax. Other: Atherosclerotic plaque within the carotid bifurcations. IMPRESSION: CT head: 1. No evidence of acute intracranial abnormality. 2. Mild cerebral atrophy and chronic small vessel ischemic disease. CT maxillofacial: 1. No acute maxillofacial fracture is identified. 2. Left anterior scalp/forehead soft tissue swelling. 3. Small right maxillary sinus mucous retention cyst. 4. Mild left maxillary sinus mucosal thickening. CT cervical spine: 1. No evidence of acute fracture to the cervical spine 2. 2 mm C3-C4 grade 1 anterolisthesis, unchanged. 3. 4 mm C4-C5 grade 1 anterolisthesis, unchanged. 4. Cervical spondylosis as described. Electronically Signed   By: Jackey Loge DO   On: 05/02/2020 17:39   DG Shoulder Left  Result Date: 05/02/2020 CLINICAL DATA:  Fall, left shoulder pain EXAM: LEFT SHOULDER - 2+ VIEW COMPARISON:  None. FINDINGS: Left pacer in place. No acute bony abnormality. Specifically, no fracture, subluxation, or dislocation. Soft tissues unremarkable. IMPRESSION: No acute bony abnormality. Electronically Signed   By: Charlett Nose M.D.   On: 05/02/2020 18:32   DG Hand Complete Left  Result Date: 05/02/2020 CLINICAL DATA:  Pt BIB EMS from home. Pt reports falling in the bathroom when standing up from the toilet and hit her head on the floor. Denies LOC. Pt has lac to left forehead. Bleeding controlled at this time. Pt endorses upper back and left thumb pain. EXAM: LEFT HAND - COMPLETE 3+ VIEW COMPARISON:  None. FINDINGS: No fracture or dislocation. Significant arthropathic changes. There is joint space narrowing, subchondral sclerosis and marginal osteophytes at the trapezium first metacarpal articulation. There is  asymmetric joint space narrowing with marginal osteophytes involving the interphalangeal joints, greatest of the DIP joints where there are also small erosions. Soft tissues are unremarkable. IMPRESSION: 1. No fracture or dislocation. 2. Advanced osteoarthritis of the interphalangeal joints and of the trapezium first metacarpal articulation. Electronically Signed   By: Amie Portland M.D.   On: 05/02/2020 16:58   DG HIP UNILAT WITH PELVIS 2-3 VIEWS LEFT  Result Date: 05/02/2020 CLINICAL DATA:  Pt BIB EMS from home. Pt reports falling in the bathroom when standing up from the toilet and hit her head on the floor. Denies LOC. Pt has lac to left forehead. Bleeding controlled at this time. Pt endorses upper back and left thumb pain. EXAM: DG HIP (WITH OR WITHOUT PELVIS) 2-3V LEFT COMPARISON:  None. FINDINGS: No fracture or bone lesion. Mild narrowing of the left hip joint space with minimal marginal osteophytes from the base of the femoral head. Right hip joint, SI joints and symphysis pubis are normally spaced and aligned. Soft tissues are unremarkable. IMPRESSION: 1. No fracture or acute finding. 2. Mild degenerative changes of the left hip. Electronically Signed   By: Amie Portland M.D.   On: 05/02/2020 16:55   CT Maxillofacial WO CM  Result Date: 05/02/2020 CLINICAL DATA:  Head trauma, minor. Neck trauma. Facial trauma. Additional provided: Fall hitting head on floor, laceration to left forehead, patient reports upper back and left thumb pain, currently  taking blood thinners. EXAM: CT HEAD WITHOUT CONTRAST CT MAXILLOFACIAL WITHOUT CONTRAST CT CERVICAL SPINE WITHOUT CONTRAST TECHNIQUE: Multidetector CT imaging of the head, cervical spine, and maxillofacial structures were performed using the standard protocol without intravenous contrast. Multiplanar CT image reconstructions of the cervical spine and maxillofacial structures were also generated. COMPARISON:  Head CT 03/05/2020. CT cervical spine and  maxillofacial 01/20/2019. FINDINGS: CT HEAD FINDINGS Brain: Mild generalized cerebral atrophy. Mild ill-defined hypoattenuation within the cerebral white matter is nonspecific, but compatible with chronic small vessel ischemic disease. There is no acute intracranial hemorrhage. No demarcated cortical infarct. No extra-axial fluid collection. No evidence of intracranial mass. No midline shift. Vascular: No hyperdense vessel.  Atherosclerotic calcifications. Skull: Normal. Negative for fracture or focal lesion. CT MAXILLOFACIAL FINDINGS Osseous: No acute maxillofacial fracture is identified. Orbits: No acute finding. The globes are normal in size and contour. The extraocular muscles and optic nerve sheath complexes are symmetric and unremarkable. Sinuses: Small right maxillary sinus mucous retention cyst. Mild left maxillary sinus mucosal thickening. Soft tissues: Left frontal scalp/forehead soft tissue swelling. CT CERVICAL SPINE FINDINGS Alignment: 2 mm C3-C4 grade 1 anterolisthesis, unchanged. 4 mm C4-C5 grade 1 anterolisthesis, unchanged. Skull base and vertebrae: The basion-dental and atlanto-dental intervals are maintained.No evidence of acute fracture to the cervical spine. Congenital nonunion of the posterior arch of C1. Soft tissues and spinal canal: No prevertebral fluid or swelling. No visible canal hematoma. Disc levels: Cervical spondylosis with multilevel disc bulges, posterior disc osteophytes, uncovertebral hypertrophy and facet arthrosis. Disc space narrowing is severe at C5-C6, C6-C7, C7-T1 and T1-T2. Upper chest: No consolidation within the imaged lung apices. No visible pneumothorax. Other: Atherosclerotic plaque within the carotid bifurcations. IMPRESSION: CT head: 1. No evidence of acute intracranial abnormality. 2. Mild cerebral atrophy and chronic small vessel ischemic disease. CT maxillofacial: 1. No acute maxillofacial fracture is identified. 2. Left anterior scalp/forehead soft tissue  swelling. 3. Small right maxillary sinus mucous retention cyst. 4. Mild left maxillary sinus mucosal thickening. CT cervical spine: 1. No evidence of acute fracture to the cervical spine 2. 2 mm C3-C4 grade 1 anterolisthesis, unchanged. 3. 4 mm C4-C5 grade 1 anterolisthesis, unchanged. 4. Cervical spondylosis as described. Electronically Signed   By: Jackey Loge DO   On: 05/02/2020 17:39    Procedures Procedures (including critical care time)  Medications Ordered in ED Medications  sulfamethoxazole-trimethoprim (BACTRIM DS) 800-160 MG per tablet 1 tablet (has no administration in time range)  oxyCODONE-acetaminophen (PERCOCET/ROXICET) 5-325 MG per tablet 1 tablet (1 tablet Oral Given 05/02/20 1655)    ED Course  I have reviewed the triage vital signs and the nursing notes.  Pertinent labs & imaging results that were available during my care of the patient were reviewed by me and considered in my medical decision making (see chart for details).   MDM Rules/Calculators/A&P                         74 year old female who presents following a fall at home onto ceramic tile. Laceration to left forehead, tenderness palpation of the left shoulder, left greater trochanter, first digit.  History of a thigh hematoma after fall on anticoagulation. Patient no longer on anticoagulation.   VS normal on intake.  Physical exam with findings above, lac to left forehead, TTP over the left greater trochanter, left shoulder, left first digit at thenar eminence and DIP.   CBC, BMP, UA, EKG ordered. Analgesia offered.   CT head / c-spine /  maxillofacial ordered. DG left hand, left hip unilateral complete, left shoulder.   CT head without acute intracranial abnormality  CT maxillofacial without acute maxillofacial fracture, however with left anterior scalp/forehead soft tissue swelling.  CT cervical spine without evidence of acute fracture, stable CT.  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  X-ray of the left shoulder without acute bony abnormality.  X-ray of the left hip and pelvis without fracture or acute finding, mild degenerative changes to the left hip.  CBC unremarkable.  BMP with mild hyponatremia 132, otherwise unremarkable.  Urinalysis, urine culture pending.   At time of my reevaluation of the patient she is resting comfortably in her hospital bed, requesting to go home.  I was able to watch the patient ambulate to the restroom, she is able to ambulate independently without difficulty.  Regarding wound on the patient's forehead, patient adamant she does not want repair.  I feel this is reasonable, as wound is small and not gaping, and has achieved hemostasis on its own.  Wound is already beginning to scab over.  Will apply antibiotic ointment, nonadherent dressing.  UA concerning for UTI, will discharge on antibiotics. Previously treated with macrobid in september, culture at that time revealed also sensitive to Bactrim. Will d/c on bactrim at this time.   Given reassuring laboratory studies, vital signs, and imaging, do not feel any further work-up is necessary in the emergency department at this time.  Mariyanna voiced understanding of her medical evaluation and treatment plan.  Her questions were answered to her expresses satisfaction.  Strict precautions were given.  Patient stable for discharge.  Final Clinical Impression(s) / ED Diagnoses Final diagnoses:  Fall    Rx / DC Orders ED Discharge Orders         Ordered    sulfamethoxazole-trimethoprim (BACTRIM DS) 800-160 MG tablet  2 times daily        05/02/20 2109           Amair Shrout, Eugene Gavia, PA-C 05/02/20 2115    Mancel Bale, MD 05/02/20 2329

## 2020-05-02 NOTE — ED Notes (Signed)
PT head wound cleaned.

## 2020-05-02 NOTE — ED Notes (Signed)
Pt daughter called informed of pt discharged instructions and to asked to pick pt up. Stated will be here in 10 minutes.

## 2020-05-03 LAB — URINE CULTURE

## 2020-05-14 ENCOUNTER — Other Ambulatory Visit (HOSPITAL_COMMUNITY): Payer: Self-pay | Admitting: Physician Assistant

## 2020-05-15 ENCOUNTER — Other Ambulatory Visit: Payer: Self-pay

## 2020-05-15 ENCOUNTER — Encounter: Payer: Self-pay | Admitting: Cardiovascular Disease

## 2020-05-15 ENCOUNTER — Other Ambulatory Visit: Payer: Self-pay | Admitting: Cardiovascular Disease

## 2020-05-15 ENCOUNTER — Ambulatory Visit (INDEPENDENT_AMBULATORY_CARE_PROVIDER_SITE_OTHER): Payer: Medicare Other | Admitting: Cardiovascular Disease

## 2020-05-15 DIAGNOSIS — I495 Sick sinus syndrome: Secondary | ICD-10-CM

## 2020-05-15 DIAGNOSIS — E039 Hypothyroidism, unspecified: Secondary | ICD-10-CM

## 2020-05-15 DIAGNOSIS — I480 Paroxysmal atrial fibrillation: Secondary | ICD-10-CM | POA: Diagnosis not present

## 2020-05-15 DIAGNOSIS — Z950 Presence of cardiac pacemaker: Secondary | ICD-10-CM

## 2020-05-15 DIAGNOSIS — M069 Rheumatoid arthritis, unspecified: Secondary | ICD-10-CM

## 2020-05-15 MED ORDER — DIGOXIN 125 MCG PO TABS: 0.0625 mg | ORAL_TABLET | Freq: Every day | ORAL | 2 refills | Status: DC

## 2020-05-15 MED ORDER — ATENOLOL 25 MG PO TABS
25.0000 mg | ORAL_TABLET | ORAL | 1 refills | Status: DC | PRN
Start: 2020-05-15 — End: 2020-07-26

## 2020-05-15 NOTE — Progress Notes (Signed)
Patient ID: Jacqueline Kim, female   DOB: 07/15/1945, 74 y.o.   MRN: [id number]     HPI: Jacqueline Kim is a 74 y.o. female  who presents for a 3 month follow-up cardiologic evaluation.  Jacqueline Kim has known history of sinus node dysfunction. She has history of paroxysmal atrial fibrillation. She is status post permanent pacemaker implanted on 04/08/2011 Medtronic Revo MRI compatible pacemaker. She also has a history of hypothyroidism. She has had recurrent episodes of paroxysmal atrial fibrillation and has been on pradaxa anticoagulation for several years after initially having been on warfarin.  She has been maintained on rhythmol SR 325 mg twice a day and atenolol 50 mg daily.  She also is on losartan 50 mg for hypertension.  She was hospitalized in January 2014 with recurrent AF in the setting of significant increased family stress. She is now separated and living in a different household from her husband [name].  She has had several episodes of recurrent AF and had an episode on 03/12/2013 which lasted for approximately 12 hours.  On the Saturday before Easter 2015 when she was rushing with family members and trying to prepare dinner she was hospitalized and presented with a heart rate at 150.  She was started on IV Cardizem for rate control.  She spontaneously cardioverted to sinus rhythm on April 6 prior to a planned TEE cardioversion.  I saw her in followup of that hospitalization and suggested an EP evaluation.  She saw [name] for discussion concerning atrial fibrillation ablation.  Apparently, at that time, the patient was feeling well. She opted against pursuing AF ablation presently.    A followup echo Doppler study on 10/24/2013 showed an ejection fraction of 60-65% and there was evidence for grade 1 diastolic function without wall motion abnormalities.  Her left atrial dimension was normal.    She is followed by [name] for her pacemaker.  She was hospitalized in early  February 2016 following a prolonged episode of atrial fibrillation with rapid ventricular response for which she was symptomatic.  She underwent her initial cardioversion on 07/27/2014.  When she saw [name] on August 08, 2014 interrogation of her pacemaker showed a 28 hour episode of atrial fibrillation without recurrence since the cardioversion.  She again developed a recurrent episode of atrial fibrillation on 08/30/2014 which led to an emergency room evaluation.  I last saw her in March.  I recommended follow-up evaluation with [name] and reconsideration of possible atrial fibrillation ablation.  She saw [name] at that time admitted that she was drinking wine on a daily basis.  Rather than pursue ablation she elected to reduce her EtOH intake.  She feels that she has done well and has felt one episode of palpitations during her period of increased stress that she is aware of since her last evaluation..  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  She denies exertional chest pain, shortness of breath, orthopnea, PND, presyncope or syncope or any neurologic sequelae.  When she met with [name].  There was no discussion of changing her anti-rhythmic therapy.    She underwent left knee surgery by [name].  She also fell on December 20, 2014 in the shower and was hospitalized for 2 days.  She developed a cellulitis and buttock abscess, which required anabiotic therapy.    She  lives at [facility].  She denies any episodes of  chest pain.  At times she notes some shortness of breath with activity.  She saw [name] in October 2017 and her pacemaker was functioning normally.  She did not have any recurrent atrial fibrillation and had only 2 brief episodes of PAT which combined last a total of 54 seconds.  She states she had stopped taking the losartan, which had been 50 mg because that time, she noted some lightheadedness unassociated with any  rhythm disturbance.  In August 2017, a CT of her abdomen was performed which did not explain some of her issues at the time of rectal bleeding and constipation.  She was found to have mild colonic diverticulosis without diverticulitis.  Incidentally, she was noted to have abdominal aortic atherosclerosis.    When I saw her in 2018, she was hypertensive and I recommended reinstitution of losartan at least 25 mg depending upon her blood pressure, but if necessary to increase to 25 mg 3 times a day.  Since I last saw her in May 2018, she underwent an echo Doppler study which showed an EF of 55-60%.  She had normal diastolic parameters.  There was mild mitral regurgitation, mild left atrial dilation, and mild pulmonary hypertension with a PA peak pressure 38 mm.  She saw [name] on 11/13/2016 and her losartan dose was reduced to 25 mg daily.  She sees Dr. Naida Sleight for rheumatoid arthritis.  She is status post left knee replacement and tolerated this well.  Her primary physician is [name].    She has had issues with scoliosis and in the future may require possible surgery.  At times she has noticed some dragging of her left leg.  She is unaware of any recurrent episodes of atrial fibrillation.  Her last echo Doppler study in May 2018 showed an EF of 55-60%.  She had mild mitral regurgitation.  There was mild pulmonary hypertension with a PA pressure 38.  She had normal diastolic parameters.   She saw [name] on February 12, 2018.  At that time, interrogation of her pacemaker revealed one 3-minute episode in the last 6 months of recurrent PAF occurred on Oct 28, 2017.  However, she comes to the office today and states that last week sleeping she was awakened with a chest fluttering and was concerned that she was back in atrial fibrillation for questionable duration.   I  saw her in October 2019 and at that time she was she  there with her daughter.  She appeared much more frail.  Due to chronic back  discomfort, she was walking with assistance.  She had moved to a lower ground apartment from her second floor unit at [facility]. She  had issues with dizziness and has fallen on a couple occasions.  She was on atenolol 25 mg twice a day, propofenone150 mg tablets  1-1/2 in the morning, 1 at midday, 1-1/2 in the evening.  She was on Cardizem CD 120 mg at bedtime.  She was on Eliquis for anticoagulation.  According to the family members she was noted to have significant snoring during sleep and was often waking up in the middle the night with possible gasping for air.  She denies any recent bruises particularly following her fall.  She is on levothyroxine for hypothyroidism.  During that evaluation, she was orthostatic.  She was 100% atrially placed.  I reduced her atenolol down to 12.5 mg.  I also recommended that she reduce her Lexapro and recommended 20 to 30 mm compression  stockings.  Due to continued episodes her atenolol was ultimately discontinued altogether.  I scheduled her for a sleep study to assess for sleep apnea.  Her sleep study was done on May 08, 2018 and was positive for sleep apnea with an overall AHI at 13.4/h.  Events were worse and sleep apnea was moderate during REM sleep with an AHI of 22.5/h.  She had significant oxygen desaturation to 80%.    When I last saw her in the office on June 28, 2018 she had not yet had her CPAP titration. However, during that evaluation she appeared exceedingly frail. She had significant orthostatic hypotension and I significantly reduced her medications. I had reduced her trazodone initially from 150 mg down to 75 mg and then subsequently decrease this to 37.5 mg. In addition I completely discontinued her furosemide. She again is now wearing support stockings and she denies any orthostatic symptoms. She underwent her CPAP titration trial on June 30, 2018 and CPAP was titrated up to 14 cm of water with still events. It was recommended that  she initiate CPAP auto therapy with a minimum of 14 up to 20 cm water pressure if needed.  I evaluated her by a telemedicine visit in April 2020.  Her CPAP set up date was August 10, 2018 with [facility]. I was able to obtain a download of her use. She apparently only initially used it for short-term and of the first month only 9 out of 30 days of use with an average use at 1 hour. Her 95th percentile pressure with minimal use was 14.3 cm and her AHI remained 4.4/h. She felt that the mask was causing her to have significant neck difficulties. As result, she has not used CPAP since. At the time of that evaluation she was unaware of episodes of dizziness. She does not have a blood pressure monitor at home and cannot take her blood pressure but she feels that she is able to go from sitting to standing position without getting dizzy. She wasunaware of any recurrent episodes of obvious atrial fibrillation. She denies chest pain. She denies bleeding on anticoagulation.  She was reevaluated in a telemedicine visit in July 2020.  At that time she continued  continued to have issues with low blood pressure.  Apparently she was evaluated by Dr. Rexene Alberts for her sensory polyneuropathy and lower extremity weakness.  He will be undergoing EMG and nerve conduction studies.  There was concerned that she was drinking wine in excess.  Since I last evaluated her, she has not been using CPAP.  She denies any awareness of recurrent atrial fibrillation.  Her blood pressure has tended to be low.    She was evaluated by [name] in all 2020 for pacemaker evaluation.  There were no recurrent episodes of atrial fibrillation on propafenone.  It was felt that her risks of anticoagulation exceeded the potential benefit particularly she was maintaining sinus rhythm.  Normal pacemaker device function.  Her blood pressure was controlled at that time.  I  saw her in January 2021 and since her prior evaluation she had  significantly Kim up wine.  She has had issues with right leg pruritus.  She was unaware of recurrent atrial fibrillation or chest pain.  Her CPAP machine had been returned.  She was taking midodrine 2.5 mg twice a day, and diltiazem 120 mg at bedtime.  She is no longer taking atenolol but has a prescription and has this as a as needed basis.  She continues to be on propafenone.  She is on trazodone 37.5 mg daily.  She is here in the office today with her daughter.  I saw her in follow-up in April 2021 at which time she felt improved.  She is living on the ground floor at [facility].  She has more energy.  She is undergoing physical therapy 2 times per week.  She denies any significant symptomatic orthostatic symptoms and is no longer taking midodrine.  She is unaware of any cardiac arrhythmias.  She has not required as needed atenolol.  She was no longer on midodrine so she had a mild systolic drop in blood pressure from 1 38-1 18 going from supine to standing she was asymptomatic and felt well.  She had been seen by [name] for pacemaker check and had a low burden of atrial fibrillation at 0.3% predominantly due to a single 10-hour episode of A. fib which occurred on February 19.  Since I last saw her, she has continued to feel well and has more strength.  Apparently recently she had tripped and fell  and hit her head. She  went to the emergency room on May 02 2020 and required stitches.  She recently had a pacemaker check mostly by [name] which she is not pacemaker dependent.  He was noted to have occasional paroxysmal episodes of atrial fibrillation usually brief but 1 episode up to 4 hours and overall burden 3.5%.  Atrial fibrillation rate control was poor during her episode of A. fib.  She now presents for evaluation.  Past Medical History:  Diagnosis Date  . Anticoagulant long-term use  WITH PRADAXA 05/27/2011  . Anxiety   . Cellulitis of buttock, left 12/20/2014  . Complication  of anesthesia    DIFFICULTY WAKING UP  . Depression   . Diverticulosis   . DJD (degenerative joint disease)   . Esophagitis 09/17/06  . External hemorrhoids   . GERD (gastroesophageal reflux disease)   . Glaucoma   . History of stress test    a. 2008 - normal nuc.  Marland Kitchen Hypertension   . Hypothyroid   . IBS (irritable bowel syndrome)   . Paroxysmal A-fib [facility]) diagnosed 2008   a. On propafenone, Pradaxa.  . 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 BREAST REDUCTION SURGERY    . BUNIONECTOMY Right 07/09/2015   with rods and pins  . CARDIOVERSION N/A 08/02/2014   Procedure: CARDIOVERSION;  Surgeon: Sueanne Margarita, MD;  Location: [address];  Service: Cardiovascular;  Laterality: N/A;  . CATARACT EXTRACTION, BILATERAL Bilateral   . FACIAL COSMETIC SURGERY    . FOOT SURGERY Left   . HAND SURGERY     multiple  . LIPOSUCTION    . NM MYOCAR PERF WALL MOTION  12/29/2006   No significant ischemia; EF 69%  . OVARIAN CYST REMOVAL    . PACEMAKER INSERTION  04/08/11   MDT Revo implanted by Dr Recardo Evangelist  . TONSILLECTOMY    . TOTAL KNEE ARTHROPLASTY Left 05/01/2015   Procedure: TOTAL LEFT KNEE ARTHROPLASTY;  Surgeon: Paralee Cancel, MD;  Location: [facility];  Service: Orthopedics;  Laterality: Left;  Marland Kitchen VAGINAL HYSTERECTOMY      Allergies  Allergen Reactions  . Azithromycin Other (See Comments)    Causes heart rhythm issues Other reaction(s): Other (See Comments) Causes  heart rhythm issues  . Epinephrine Other (See Comments)    Causes A-Fib Other reaction(s): Increased Heart Rate (intolerance) States that it caused her heart to beat fast, so she will not take it. Causes A-Fib A-fib Triggers atrial fib   . Penicillins Anaphylaxis    Has patient had a PCN reaction causing immediate rash, facial/tongue/throat swelling, SOB or  lightheadedness with hypotension: Yes Has patient had a PCN reaction causing severe rash involving mucus membranes or skin necrosis: No Has patient had a PCN reaction that required hospitalization No Has patient had a PCN reaction occurring within the last 10 years: No If all of the above answers are "NO", then may proceed with Cephalosporin use.   . Lidocaine Palpitations    Or any similar medication Other reaction(s): Other (See Comments) Or any similar medication  . Metoprolol Other (See Comments)    alopecia Other reaction(s): Other (See Comments) alopecia    Current Outpatient Medications  Medication Sig Dispense Refill  . atenolol (TENORMIN) 25 MG tablet Take 1 tablet (25 mg total) by mouth as needed (1/2 tablet to 1 tablet as needed). 90 tablet 1  . CARTIA XT 120 MG 24 hr capsule TAKE ONE CAPSULE BY MOUTH EVERY NIGHT AT BEDTIME 90 capsule 3  . HYDROcodone-acetaminophen (NORCO/VICODIN) 5-325 MG tablet Take 1 tablet by mouth every 6 (six) hours as needed.    . hydroxychloroquine (PLAQUENIL) 200 MG tablet Take 200 mg by mouth daily.    Marland Kitchen levothyroxine (SYNTHROID, LEVOTHROID) 112 MCG tablet Take 112 mcg by mouth daily before breakfast.     . linaclotide (LINZESS) 72 MCG capsule Take 1 capsule (72 mcg total) by mouth daily before breakfast. 30 capsule 3  . propafenone (RYTHMOL) 150 MG tablet TAKE ONE AND ONE-HALF TABLETS BY MOUTH EVERY MORNING, ONE TABLET BY MOUTH MID-DAY, AND ONE AND ONE-HALF TABLETS AT BEDTIME 360 tablet 3  . traZODone (DESYREL) 150 MG tablet Take by mouth at bedtime.    . digoxin (LANOXIN) 0.125 MG tablet Take 0.5 tablets (0.0625 mg total) by mouth daily. 30 tablet 2   No current facility-administered medications for this visit.    Social History   Socioeconomic History  . Marital status: Divorced    Spouse name: Not on file  . Number of children: 3  . Years of education: Not on file  . Highest education level: Not on file  Occupational History  .  Occupation: retired    Fish farm manager: RETIRED  Tobacco Use  . Smoking status: Former Smoker    Quit date: 08/30/1996    Years since quitting: 23.7  . Smokeless tobacco: Never Used  Substance and Sexual Activity  . Alcohol use: Yes    Alcohol/week: 6.0 standard drinks    Types: 6 Glasses of wine per week    Comment: daily  . Drug use: No  . Sexual activity: Yes  Other Topics Concern  . Not on file  Social History Narrative   Lives in [place],  Former [employer],  Separated.     Social Determinants of Health   Financial Resource Strain:   . Difficulty of Paying Living Expenses: Not on file  Food Insecurity:   . Worried About Charity fundraiser in the Last Year: Not on file  . Ran Out of Food in the Last Year: Not on file  Transportation Needs:   . Lack of Transportation (Medical): Not on file  . Lack of Transportation (Non-Medical): Not on file  Physical Activity:   .  Days of Exercise per Week: Not on file  . Minutes of Exercise per Session: Not on file  Stress:   . Feeling of Stress : Not on file  Social Connections:   . Frequency of Communication with Friends and Family: Not on file  . Frequency of Social Gatherings with Friends and Family: Not on file  . Attends Religious Services: Not on file  . Active Member of Clubs or Organizations: Not on file  . Attends Archivist Meetings: Not on file  . Marital Status: Not on file  Intimate Partner Violence:   . Fear of Current or Ex-Partner: Not on file  . Emotionally Abused: Not on file  . Physically Abused: Not on file  . Sexually Abused: Not on file    Family History  Problem Relation Age of Onset  . Anesthesia problems Mother   . Colon polyps Mother        beign  . Colon cancer Neg Hx    ROS General: Negative; No fevers, chills, or night sweats;  HEENT: Negative; No changes in vision or hearing, sinus congestion, difficulty swallowing Pulmonary: Negative; No cough, wheezing, shortness of  breath, hemoptysis Cardiovascular:  See HPI GI: Positive for irritable bowel syndrome for which she takes Bentyl before meals;  GU: Negative; No dysuria, hematuria, or difficulty voiding Musculoskeletal: Positive for rheumatoid arthritis, and scoliosis. Hematologic/Oncology: Negative; no easy bruising, bleeding Endocrine: Negative; no heat/cold intolerance; no diabetes Neuro: Negative; no changes in balance, headaches Skin: Negative; No rashes or skin lesions Psychiatric: There is less stress and anxiety; No behavioral problems, depression Sleep: Positive for recently diagnosed OSA.  Stop using CPAP therapy.  PE BP 126/72   Pulse 62   Ht _0  (1.549 m)   Wt 125 lb 3.2 oz (56.8 kg)   SpO2 98%   BMI 23.66 kg/m    Pressure by me was 126/68 supine and was 98/60 standing.  Wt Readings from Last 3 Encounters:  05/15/20 125 lb 3.2 oz (56.8 kg)  03/05/20 123 lb 14.4 oz (56.2 kg)  10/03/19 124 lb (56.2 kg)   General: Alert, oriented, no distress.  Skin: normal turgor, no rashes, warm and dry HEENT: Normocephalic, atraumatic. Pupils equal round and reactive to light; sclera anicteric; extraocular muscles intact; Nose without nasal septal hypertrophy Mouth/Parynx benign; Mallinpatti scale 3 Neck: No JVD, no carotid bruits; normal carotid upstroke Lungs: clear to ausculatation and percussion; no wheezing or rales Chest wall: without tenderness to palpitation Heart: PMI not displaced, RRR, s1 s2 normal, 1/6 systolic murmur, no diastolic murmur, no rubs, gallops, thrills, or heaves Abdomen: soft, nontender; no hepatosplenomehaly, BS+; abdominal aorta nontender and not dilated by palpation. Back: no CVA tenderness Pulses 2+ Musculoskeletal: full range of motion, normal strength, no joint deformities Extremities: no clubbing cyanosis or edema, Homan's sign negative  Neurologic: grossly nonfocal; Cranial nerves grossly wnl Psychologic: Normal mood and affect   ECG (independently read by  me): Atrially paced rhythm at 62 bpm.  Prolonged AV conduction with PR interval of 312 ms, left anterior hemiblock.  QS complex V1 to V3.  QTc interval 464 ms.  October 03, 2019 chart and ECG (independently read by me): Atrially paced rhythm with prolonged AV conduction with PR interval of 292 ms; QTc interval 458 ms    July 13, 2019 ECG (independently read by me): Atrially paced rhythm at 61 bpm.  Prolonged AV conduction with PR interval to 288 ms.  No ectopy  January 2020 ECG (independently read by  me): Lee paced rhythm with prolonged AV conduction.  Ventricular rate 65 bpm.  PR interval 248 ms.  No ectopy.  October 2019 ECG (independently read by me): Atrially paced rhythm with prolonged AV conduction with PR interval 264 ms.  Ventricular rate 64 bpm.  QTc interval 478 ms.  May 2019 ECG (independently read by me): Atrially paced rhythm at 62 bpm with prolonged AV conduction.  PR interval _0 ms.  QTc interval _1 ms.  November 2018 ECG (independently read by me): atrially paced rhythm with prolonged AV conduction with PR interval at 264 ms.  PAC.  QTc interval increase that 503 ms.  May 2018 ECG (independently read by me): Atrially paced rhythm at 69 bpm.  Prolonged AV conduction with PR interval 266 milliseconds.  Increased QTc interval 497 ms.  August 2016 ECG (independently read by me): Atrially paced rhythm at 66 bpm.  PR interval 232 ms.  QTc interval normal at 448 ms.  ECG (independently read by me): Atrially paced rhythm at 64 bpm.  No AF.  QTC 435 ms.  December 2015 ECG (independently read by me): Atrially paced rhythm at 65 with prolonged AV conduction with a PR interval at 270 ms.  No significant ST segment changes.   June 2015 ECG (independently read by me): Atrially paced rhythm at 62 beats per minute with prolonged AV conduction with a PR interval of 244 ms.  QTc interval 444 ms.  Prior 09/22/2013 ECG (independently read by me): Atrial paced rhythm at 63 beats per minute  with ventricular sensing.   LABS:  BMP Latest Ref Rng & Units 05/02/2020 03/05/2020 01/26/2019  Glucose 70 - 99 mg/dL 89 98 94  BUN 8 - 23 mg/dL 18 21 6(L)  Creatinine 0.44 - 1.00 mg/dL 0.96 0.99 0.87  Sodium 135 - 145 mmol/L 132(L) 133(L) 131(L)  Potassium 3.5 - 5.1 mmol/L 4.3 4.1 4.1  Chloride 98 - 111 mmol/L 96(L) 97(L) 100  CO2 22 - 32 mmol/L _2 Calcium 8.9 - 10.3 mg/dL 9.5 9.1 8.9    Hepatic Function Latest Ref Rng & Units 03/05/2020 01/22/2016 12/21/2014  Total Protein 6.5 - 8.1 g/dL 6.6 6.9 4.8(L)  Albumin 3.5 - 5.0 g/dL 3.8 4.0 2.3(L)  AST 15 - 41 U/L 54(H) 26 19  ALT 0 - 44 U/L 54(H) 14 13(L)  Alk Phosphatase 38 - 126 U/L 71 40 37(L)  Total Bilirubin 0.3 - 1.2 mg/dL 0.8 0.6 0.4  Bilirubin, Direct 0.0 - 0.3 mg/dL - - -    CBC Latest Ref Rng & Units 05/02/2020 03/05/2020 01/26/2019  WBC 4.0 - 10.5 K/uL 10.4 6.8 5.9  Hemoglobin 12.0 - 15.0 g/dL 12.5 13.5 9.6(L)  Hematocrit 36 - 46 % 38.6 41.4 28.9(L)  Platelets 150 - 400 K/uL 254 221 314   Lab Results  Component Value Date   MCV 97.7 05/02/2020   MCV 93.7 03/05/2020   MCV 91.5 01/26/2019    Lab Results  Component Value Date   TSH 6.630 (H) 08/30/2014   Lab Results  Component Value Date   HGBA1C 5.3 04/01/2011    BNP    Component Value Date/Time   PROBNP 1,110.0 (H) 09/18/2013 0332    Lipid Panel     Component Value Date/Time   CHOL  12/19/2006 0315    172        ATP III CLASSIFICATION:  <200     mg/dL   Desirable  200-239  mg/dL   Borderline  High  >=240    mg/dL   High   TRIG 293 (H) 12/19/2006 0315   HDL 37 (L) 12/19/2006 0315   CHOLHDL 4.6 12/19/2006 0315   VLDL 59 (H) 12/19/2006 0315   LDLCALC  12/19/2006 0315    76        Total Cholesterol/HDL:CHD Risk Coronary Heart Disease Risk Table                     Men   Women  1/2 Average Risk   3.4   3.3     RADIOLOGY: No results found.  IMPRESSION:  1. Sinus node dysfunction (HCC)   2. SSS (sick sinus syndrome) (HCC)   3. Paroxysmal  atrial fibrillation ([place])   4. Hypothyroidism, unspecified type   5. Rheumatoid arthritis, involving unspecified site, unspecified whether rheumatoid factor present [facility])     ASSESSMENT AND PLAN: Ms. Zillah Alexie is a 74 year old Caucasian female who has a history of hypertension, PAF and is status post permanent pacemaker for sinus node dysfunction.  She previously was anticoagulated and most recently is no longer on anticoagulation due to fall risk.  She denies any recent recurrent episodes of atrial fibrillation documented on device monitoring.  She has lost significant weight and has significantly reduced her wine intake.  With her medication adjustments on prior evaluations, her blood pressure is now increased and no longer low with severe orthostatic hypotension.  Over the past several months she has gained strength and has felt well.  She is no longer taking midodrine.  Currently she is asymptomatic.  However she continues to have a mild orthostatic blood pressure drop.  She recently had fallen after tripping and denied any prodrome of dizziness.  She has been noted to have short bursts of PAF on her most recent pacemaker check with rapid heart rates during atrial fibrillation.  She currently is on atenolol she takes only on a as needed basis Cartia XT 120 mg at bedtime and continues to be on propafenone which he takes 225 mg twice a day does not take a midday dose.  She also has been on hydroxychloroquine for her rheumatoid arthritis.  Since she is having some increased episodes of AF with fast heart rates, and since her blood pressure would not tolerate potential further titration of medicines which can lower blood pressure I have elected to initiate very low-dose digoxin at 0.0625 mg daily.  Hopefully will provide additional rate control and potential duction and recurrent AF episodes.  She will be continued to undergo remote pacemaker checks.  If she does experience recurrent orthostatic  dizziness she may require reinstitution of low-dose midodrine.  Continues to be on levothyroxine for hypothyroidism.  She is on hydroxychloroquine for rheumatoid arthritis.  ECG QTc interval at 464 ms. I will see her in 6 months for reevaluation.   Troy Sine, MD, [facility]  05/17/2020 7:20 PM

## 2020-05-15 NOTE — Patient Instructions (Addendum)
Medication Instructions:  START DIGOXIN- 0.0625 mg total (1/2) of a tablet once daily  USE ATENOLOL- JUST AS NEEDED-  *If you need a refill on your cardiac medications before your next appointment, please call your pharmacy*  Follow-Up: At [facility], you and your health needs are our priority.  As part of our continuing mission to provide you with exceptional heart care, we have created designated Provider Care Teams.  These Care Teams include your primary Cardiologist (physician) and Advanced Practice Providers (APPs -  Physician Assistants and Nurse Practitioners) who all work together to provide you with the care you need, when you need it.  Your next appointment:   6 month(s)  The format for your next appointment:   In Person  Provider:   Nicki Guadalajara, MD

## 2020-05-17 ENCOUNTER — Encounter: Payer: Self-pay | Admitting: Cardiovascular Disease

## 2020-06-11 ENCOUNTER — Other Ambulatory Visit (HOSPITAL_COMMUNITY): Payer: Self-pay | Admitting: Internal Medicine

## 2020-06-28 ENCOUNTER — Encounter (INDEPENDENT_AMBULATORY_CARE_PROVIDER_SITE_OTHER): Payer: Self-pay | Admitting: Ophthalmology

## 2020-06-28 ENCOUNTER — Ambulatory Visit (INDEPENDENT_AMBULATORY_CARE_PROVIDER_SITE_OTHER): Payer: Medicare Other | Admitting: Ophthalmology

## 2020-06-28 ENCOUNTER — Other Ambulatory Visit: Payer: Self-pay

## 2020-06-28 DIAGNOSIS — H353124 Nonexudative age-related macular degeneration, left eye, advanced atrophic with subfoveal involvement: Secondary | ICD-10-CM | POA: Insufficient documentation

## 2020-06-28 DIAGNOSIS — H353123 Nonexudative age-related macular degeneration, left eye, advanced atrophic without subfoveal involvement: Secondary | ICD-10-CM

## 2020-06-28 DIAGNOSIS — H353112 Nonexudative age-related macular degeneration, right eye, intermediate dry stage: Secondary | ICD-10-CM | POA: Diagnosis not present

## 2020-06-28 DIAGNOSIS — G4733 Obstructive sleep apnea (adult) (pediatric): Secondary | ICD-10-CM | POA: Diagnosis not present

## 2020-06-28 DIAGNOSIS — H353222 Exudative age-related macular degeneration, left eye, with inactive choroidal neovascularization: Secondary | ICD-10-CM

## 2020-06-28 DIAGNOSIS — H353133 Nonexudative age-related macular degeneration, bilateral, advanced atrophic without subfoveal involvement: Secondary | ICD-10-CM | POA: Insufficient documentation

## 2020-06-28 NOTE — Progress Notes (Signed)
06/28/2020     CHIEF COMPLAINT Patient presents for Retina Follow Up (2 YR FU OU////Pt reports vision worsening OS>OD, pt denies any new F/F, some watering OS. )   HISTORY OF PRESENT ILLNESS: Jacqueline Kim is a 75 y.o. female who presents to the clinic today for:   HPI    Retina Follow Up    Patient presents with  Other.  This started 2 years ago.  Duration of 2 years.  Since onset it is gradually worsening. Additional comments: 2 YR FU OU    Pt reports vision worsening OS>OD, pt denies any new F/F, some watering OS.        Last edited by Varney Biles D on 06/28/2020  1:16 PM. (History)      Referring physician: Georgianne Fick, MD [address] [place],  [place] [address]  HISTORICAL INFORMATION:   Selected notes from the MEDICAL RECORD NUMBER    Lab Results  Component Value Date   HGBA1C 5.3 04/01/2011     CURRENT MEDICATIONS: No current outpatient medications on file. (Ophthalmic Drugs)   No current facility-administered medications for this visit. (Ophthalmic Drugs)   Current Outpatient Medications (Other)  Medication Sig  . atenolol (TENORMIN) 25 MG tablet Take 1 tablet (25 mg total) by mouth as needed (1/2 tablet to 1 tablet as needed).  . CARTIA XT 120 MG 24 hr capsule TAKE ONE CAPSULE BY MOUTH EVERY NIGHT AT BEDTIME  . digoxin (LANOXIN) 0.125 MG tablet Take 0.5 tablets (0.0625 mg total) by mouth daily.  Marland Kitchen HYDROcodone-acetaminophen (NORCO/VICODIN) 5-325 MG tablet Take 1 tablet by mouth every 6 (six) hours as needed.  . hydroxychloroquine (PLAQUENIL) 200 MG tablet Take 200 mg by mouth daily.  Marland Kitchen levothyroxine (SYNTHROID, LEVOTHROID) 112 MCG tablet Take 112 mcg by mouth daily before breakfast.   . linaclotide (LINZESS) 72 MCG capsule Take 1 capsule (72 mcg total) by mouth daily before breakfast.  . propafenone (RYTHMOL) 150 MG tablet TAKE ONE AND ONE-HALF TABLETS BY MOUTH EVERY MORNING, ONE TABLET BY MOUTH MID-DAY, AND ONE AND ONE-HALF  TABLETS AT BEDTIME  . traZODone (DESYREL) 150 MG tablet Take by mouth at bedtime.   No current facility-administered medications for this visit. (Other)      REVIEW OF SYSTEMS:    ALLERGIES Allergies  Allergen Reactions  . Azithromycin Other (See Comments)    Causes heart rhythm issues Other reaction(s): Other (See Comments) Causes heart rhythm issues  . Epinephrine Other (See Comments)    Causes A-Fib Other reaction(s): Increased Heart Rate (intolerance) States that it caused her heart to beat fast, so she will not take it. Causes A-Fib A-fib Triggers atrial fib   . Penicillins Anaphylaxis    Has patient had a PCN reaction causing immediate rash, facial/tongue/throat swelling, SOB or lightheadedness with hypotension: Yes Has patient had a PCN reaction causing severe rash involving mucus membranes or skin necrosis: No Has patient had a PCN reaction that required hospitalization No Has patient had a PCN reaction occurring within the last 10 years: No If all of the above answers are "NO", then may proceed with Cephalosporin use.   . Lidocaine Palpitations    Or any similar medication Other reaction(s): Other (See Comments) Or any similar medication  . Metoprolol Other (See Comments)    alopecia Other reaction(s): Other (See Comments) alopecia    PAST MEDICAL HISTORY Past Medical History:  Diagnosis Date  . Anticoagulant long-term use  WITH PRADAXA 05/27/2011  . Anxiety   .  Cellulitis of buttock, left 12/20/2014  . Complication of anesthesia    DIFFICULTY WAKING UP  . Depression   . Diverticulosis   . DJD (degenerative joint disease)   . Esophagitis 09/17/06  . External hemorrhoids   . GERD (gastroesophageal reflux disease)   . Glaucoma   . History of stress test    a. 2008 - normal nuc.  Marland Kitchen Hypertension   . Hypothyroid   . IBS (irritable bowel syndrome)   . Paroxysmal A-fib [facility]) diagnosed 2008   a. On propafenone, Pradaxa.  . Presence of permanent cardiac  pacemaker   . rheumatoid   . 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 BREAST REDUCTION SURGERY    . BUNIONECTOMY Right 07/09/2015   with rods and pins  . CARDIOVERSION N/A 08/02/2014   Procedure: CARDIOVERSION;  Surgeon: Quintella Reichert, MD;  Location: [facility] ENDOSCOPY;  Service: Cardiovascular;  Laterality: N/A;  . CATARACT EXTRACTION, BILATERAL Bilateral   . FACIAL COSMETIC SURGERY    . FOOT SURGERY Left   . HAND SURGERY     multiple  . LIPOSUCTION    . NM MYOCAR PERF WALL MOTION  12/29/2006   No significant ischemia; EF 69%  . OVARIAN CYST REMOVAL    . PACEMAKER INSERTION  04/08/11   MDT Revo implanted by Dr Rubie Maid  . TONSILLECTOMY    . TOTAL KNEE ARTHROPLASTY Left 05/01/2015   Procedure: TOTAL LEFT KNEE ARTHROPLASTY;  Surgeon: Durene Romans, MD;  Location: [facility] ORS;  Service: Orthopedics;  Laterality: Left;  Marland Kitchen VAGINAL HYSTERECTOMY      FAMILY HISTORY Family History  Problem Relation Age of Onset  . Anesthesia problems Mother   . Colon polyps Mother        beign  . Colon cancer Neg Hx     SOCIAL HISTORY Social History   Tobacco Use  . Smoking status: Former Smoker    Quit date: 08/30/1996    Years since quitting: 23.8  . Smokeless tobacco: Never Used  Substance Use Topics  . Alcohol use: Yes    Alcohol/week: 6.0 standard drinks    Types: 6 Glasses of wine per week    Comment: daily  . Drug use: No         OPHTHALMIC EXAM:  Base Eye Exam    Visual Acuity (ETDRS)      Right Left   Dist [place] 20/50 +2 20/100 -1   Dist ph [place] 20/30 -1 20/40 -2       Tonometry (Tonopen, 1:23 PM)      Right Left   Pressure 16 16       Pupils      Pupils Dark Light Shape React APD   Right PERRL 4 3 Round Brisk None   Left PERRL 4 3 Round Brisk None       Visual Fields (Counting fingers)      Left Right    Full Full       Extraocular Movement      Right Left    Full Full        Neuro/Psych    Oriented x3: Yes       Dilation    Both eyes: 1.0% Mydriacyl, 2.5% Phenylephrine @ 1:24 PM        Slit Lamp and Fundus Exam    External Exam      Right Left   External Normal Normal  Slit Lamp Exam      Right Left   Lids/Lashes Normal Normal   Conjunctiva/Sclera White and quiet White and quiet   Cornea Clear Clear   Anterior Chamber Deep and quiet Deep and quiet   Iris Round and reactive Round and reactive   Lens Centered posterior chamber intraocular lens Centered posterior chamber intraocular lens   Anterior Vitreous Normal Normal       Fundus Exam      Right Left   Posterior Vitreous Posterior vitreous detachment Posterior vitreous detachment   Disc Normal Normal   C/D Ratio 0.6 0.6   Macula Soft drusen, Retinal pigment epithelial mottling Soft drusen, Retinal pigment epithelial mottling, Geographic atrophy   Vessels Normal Normal   Periphery Normal Normal          IMAGING AND PROCEDURES  Imaging and Procedures for 06/28/20  OCT, Retina - OU - Both Eyes       Right Eye Quality was good. Scan locations included subfoveal. Central Foveal Thickness: 240. Progression has improved. Findings include abnormal foveal contour, retinal drusen .   Left Eye Quality was good. Scan locations included subfoveal. Central Foveal Thickness: 231. Progression has improved. Findings include abnormal foveal contour, retinal drusen .   Notes No signs of active CNVM OU  Interestingly, large subfoveal drusenoid deposits in the right eye with large volume seen and detected on last OCT dated 05/31/2018 in the right eye Have now abated.  OS no signs of new CNVM.  Outer retinal disruption of easy and photoreceptor layer accounts for paracentral foveal scotoma                ASSESSMENT/PLAN:  No problem-specific Assessment & Plan notes found for this encounter.      ICD-10-CM   1. Intermediate stage nonexudative age-related macular degeneration of  right eye  H35.3112 OCT, Retina - OU - Both Eyes  2. 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 Advanced nonexudative age-related macular degeneration of left eye without subfoveal involvement  H35.3123 OCT, Retina - OU - Both Eyes  4. Obstructive sleep apnea of adult  G47.33     1.  2.  3.  Ophthalmic Meds Ordered this visit:  No orders of the defined types were placed in this encounter.      Return in about 6 months (around 12/26/2020) for DILATE OU, OCT.  There are no Patient Instructions on file for this visit.   Explained the diagnoses, plan, and follow up with the patient and they expressed understanding.  Patient expressed understanding of the importance of proper follow up care.   Alford Highland Eros Montour M.D. Diseases & Surgery of the Retina and Vitreous [facility] 06/28/20     Abbreviations: M myopia (nearsighted); A astigmatism; H hyperopia (farsighted); P presbyopia; Mrx spectacle prescription;  CTL contact lenses; OD right eye; OS left eye; OU both eyes  XT exotropia; ET esotropia; PEK punctate epithelial keratitis; PEE punctate epithelial erosions; DES dry eye syndrome; MGD meibomian gland dysfunction; ATs artificial tears; PFAT's preservative free artificial tears; NSC nuclear sclerotic cataract; PSC posterior subcapsular cataract; ERM epi-retinal membrane; PVD posterior vitreous detachment; RD retinal detachment; DM diabetes mellitus; DR diabetic retinopathy; NPDR non-proliferative diabetic retinopathy; PDR proliferative diabetic retinopathy; CSME clinically significant macular edema; DME diabetic macular edema; dbh dot blot hemorrhages; CWS cotton wool spot; POAG primary open angle glaucoma; C/D cup-to-disc ratio; HVF humphrey  visual field; GVF goldmann visual field; OCT optical coherence tomography; IOP intraocular pressure; BRVO Branch retinal vein  occlusion; CRVO central retinal vein occlusion; CRAO central retinal artery occlusion; BRAO branch retinal artery occlusion; RT retinal tear; SB scleral buckle; PPV pars plana vitrectomy; VH Vitreous hemorrhage; PRP panretinal laser photocoagulation; IVK intravitreal kenalog; VMT vitreomacular traction; MH Macular hole;  NVD neovascularization of the disc; NVE neovascularization elsewhere; AREDS age related eye disease study; ARMD age related macular degeneration; POAG primary open angle glaucoma; EBMD epithelial/anterior basement membrane dystrophy; ACIOL anterior chamber intraocular lens; IOL intraocular lens; PCIOL posterior chamber intraocular lens; Phaco/IOL phacoemulsification with intraocular lens placement; PRK photorefractive keratectomy; LASIK laser assisted in situ keratomileusis; HTN hypertension; DM diabetes mellitus; COPD chronic obstructive pulmonary disease

## 2020-07-10 ENCOUNTER — Other Ambulatory Visit (HOSPITAL_COMMUNITY): Payer: Self-pay | Admitting: Internal Medicine

## 2020-07-16 ENCOUNTER — Other Ambulatory Visit (HOSPITAL_COMMUNITY): Payer: Self-pay | Admitting: Internal Medicine

## 2020-07-23 ENCOUNTER — Encounter: Payer: Medicare Other | Admitting: Cardiovascular Disease

## 2020-07-26 ENCOUNTER — Encounter: Payer: Self-pay | Admitting: Neurology

## 2020-07-26 ENCOUNTER — Ambulatory Visit (INDEPENDENT_AMBULATORY_CARE_PROVIDER_SITE_OTHER): Payer: Medicare Other | Admitting: Neurology

## 2020-07-26 ENCOUNTER — Ambulatory Visit: Payer: Medicare Other | Admitting: Neurology

## 2020-07-26 VITALS — BP 132/82 | HR 77 | Ht 65.0 in | Wt 133.0 lb

## 2020-07-26 DIAGNOSIS — Z820 Family history of epilepsy and other diseases of the nervous system: Secondary | ICD-10-CM | POA: Diagnosis not present

## 2020-07-26 DIAGNOSIS — R413 Other amnesia: Secondary | ICD-10-CM | POA: Diagnosis not present

## 2020-07-26 NOTE — Patient Instructions (Signed)
You have complaints of memory loss: memory loss or changes in cognitive function can have many reasons and does not always mean you have dementia. Conditions that can contribute to subjective or objective memory loss include: depression, stress, poor sleep from insomnia or sleep apnea, dehydration, fluctuation in blood sugar values, thyroid or electrolyte dysfunction and certain vitamin deficiencies. Dementia can be caused by stroke, brain atherosclerosis or brain vascular disease due to vascular risk factors (smoking, high blood pressure, high cholesterol, obesity and uncontrolled diabetes), certain degenerative brain disorders (including Parkinson's disease and Multiple sclerosis) and by Alzheimer's disease or other, more rare and sometimes hereditary causes. We will do some additional testing: blood work (which has been ordered or done recently already) and we will do a brain scan. We will not start medication as yet.   We may consider a formal cognitive test called neuropsychological evaluation which is done by a licensed neuropsychologist. We will discuss this next time.   We will call you with brain scan test results and monitor your symptoms.

## 2020-07-26 NOTE — Progress Notes (Signed)
Subjective:    Patient ID: Jacqueline Kim is a 75 y.o. female.  HPI     Interim history:   Dear Dr. Ashby Kim,   I saw your patient, Jacqueline Kim, upon your kind request in my neurologic clinic today for initial consultation of her memory loss. The patient is accompanied by her daughter, Jacqueline Kim, today.  As you know, Jacqueline Kim is a 75 year old right-handed woman with an underlying complex medical history of arthritis, degenerative spine disease, hypertension, A. fib, hypothyroidism, history of CHF, reflux disease, depression, glaucoma, anxiety, depression, irritable bowel syndrome, status post pacemaker placement, hyponatremia, gait disorder and recurrent falls, who reports short-term memory issues for the past year.  She is forgetful.  She has good long-term memory.  She does not have a telltale family history of dementia but does not know anything about her father's side.  Father died at the age of 8.  Mom had memory issues later in life, past age 27.  She lived to be 77 years old.  Maternal grandmother also had memory issues also later in life.  Patient quit smoking some 15 years ago and she is working on alcohol reduction.  She has been drinking daily in the past but since our appointment they have been working on reducing her daily alcohol consumption, she drinks a bottle over 3 days.  She has a call alert button.  She has help with showering.  Her balance has improved since she has been in physical therapy, she goes twice a week.  She has 2 half-brothers who have a different father and one of them passed away.  She had a half sister who had another father and also passed away from complications of her lung disease as I understand.  Weight has been stable.  Appetite is not great, she does not tend to have proper meals, likes to eat snacks but likes fresh fruit.  She does not always drink water very well per daughter's report.  They are working on improving water intake.  She had recent issues  with UTIs as well.  I saw her over 18 months ago for evaluation of her balance problem and gait disorder.  She had findings concerning for muscular deconditioning, unintentional weight loss, and peripheral neuropathy.  She had undergone extensive blood work already and she was advised to proceed with EMG/nerve conduction velocity testing, abstain completely from drinking alcohol, and stop driving.  She canceled the EMG and nerve conduction velocity test twice and did not reschedule thereafter. Daughter reports that she ended up having the EMG and nerve conduction velocity test through neurosurgery.  They will get those records for [place].  She has had several interim ER visits including for falls. I reviewed your office records. Note was not included.  She had blood work on 02/28/2020 and I was able to review the results, CBC with differential was unremarkable, CMP showed sodium of 140, glucose 131, BUN 17, creatinine 1.2, AST 61 ALT elevated, look like 199, alk phos normal.  Magnesium 2.0.  TSH 1.13, urinalysis benign.  B12 greater than 2000, RPR nonreactive, folic acid 55.7, HIV screen nonreactive.   She is also scheduled for blood work, could not get it done recently because they had a hard time finding a vein.  They will get the records to [place] once she has had her repeat blood test.  Has had multiple CT scans in the interim.  Head CT, maxillofacial CT and cervical spine CT without contrast on 05/02/2020 showed:  IMPRESSION:  CT head:   1. No evidence of acute intracranial abnormality. 2. Mild cerebral atrophy and chronic small vessel ischemic disease.   CT maxillofacial:   1. No acute maxillofacial fracture is identified. 2. Left anterior scalp/forehead soft tissue swelling. 3. Small right maxillary sinus mucous retention cyst. 4. Mild left maxillary sinus mucosal thickening.   CT cervical spine:   1. No evidence of acute fracture to the cervical spine 2. 2 mm C3-C4 grade 1 anterolisthesis,  unchanged. 3. 4 mm C4-C5 grade 1 anterolisthesis, unchanged. 4. Cervical spondylosis as described.   CT scan without contrast of her head on 03/05/2020 showed:  IMPRESSION: 1. No acute intracranial abnormalities. 2. Chronic small vessel ischemic disease and brain atrophy.   Head CT without contrast, cervical spine CT without contrast and maxillofacial CT without contrast on 01/20/2019 showed:  IMPRESSION: Atrophy, chronic microvascular disease.   No acute intracranial abnormality.   Cervical spondylosis. Grade 1 anterolisthesis along with disc herniation noted at C4-5. No fracture in the cervical spine or face.    The patient's allergies, current medications, family history, past medical history, past social history, past surgical history and problem list were reviewed and updated as appropriate.   Previously (copied from previous notes for reference):   12/27/18: (She) reports a longer standing history of gait and balance problems and recurrent falls, worse in the past 8 weeks. I reviewed your office note from 11/18/2018.  She has had recurrent falls.  Of note, she is on multiple medications including Eliquis, diltiazem, furosemide, Plaquenil, trazodone, midodrine, hydrocodone, Voltaren, atenolol, propafenone.  She had blood work through your office, CRP was elevated at 35, magnesium was 1.6 which is slightly low, aldolase was within limits, CMP showed sodium of 129, AST of 54, ALT 39, CK level was 200, rheumatoid factor 25.7, ESR 13, TSH 1.31, ANA negative, CCP antibodies negative she has been followed by orthopedics and neurosurgery.  She has had progressive lower extremity weakness.  There has been concern that the patient is drinking alcohol excessively, up to 1-1/2 L of wine per day.  She has been using a walker.  She is status post left knee replacement surgery.  She has been seeing Dr. Brien Kim for pain management.  She had an EMG and nerve conduction study through [facility].  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   She had a CT lumbar spine without contrast on 08/20/2017 due to progressive lower extremity weakness and pain reported, this was ordered by Dr. Nelva Bush, and I reviewed the results: IMPRESSION: Severe convex right scoliosis.   Moderate to moderately severe central canal and bilateral subarticular recess narrowing at L3-4 where there is a disc bulge and advanced bilateral facet degenerative change. Mild left foraminal narrowing is also present at this level.   Moderate to moderately severe central canal stenosis at L4-5. The foramina appear open.   Partial autologous fusion across the disc interspace at L1-2.   Disc and endplate spur at Q2-5 cause narrowing in the left lateral recess which could impact the descending left L3 root.   She had a CT cervical spine and lumbar spine CT with contrast on 10/09/2017 as ordered by Dr. Sherley Bounds and I reviewed the results: IMPRESSION: 1. Chronic degenerative disc disease and disc osteophyte complex in the cervical spine. 2. Moderate to severe left foraminal stenosis at C3-4. 3. Moderate central and bilateral foraminal stenosis at C4-5 and C5-6. 4. Grade 1 anterolisthesis at C4-5 with a prominent soft disc protrusion. 5.  Mild central and bilateral foraminal narrowing at C6-7. 6. Lumbar scoliosis. 7. Mild left subarticular narrowing at L2-3. 8. Moderate right and mild left subarticular and foraminal stenosis is present at L3-4. 9. Mild subarticular and foraminal narrowing bilaterally at L4-5.   She had a head CT without contrast on 05/15/2018 after a fall and I reviewed the results: MPRESSION: 1. No acute intracranial abnormalities. 2. Right frontal scalp hematoma.  No skull fracture.   Per daughter, she has lost quite a bit of weight, she moved from a apartment to a ground-floor apartment last year.  Patient reports that she had lost her appetite, she has weighed 132 pounds  at your office on 11/18/2018.  She had repeat labs through your office on 12/16/2018 and per daughter her sodium level and liver enzyme was better.  She fell about 3 to 4 weeks ago, she has been losing weight over the past 6 months at least.  She admits to drinking heavily for several years, she has a family history of alcoholism on her mother's side, she admits that she had been drinking wine excessively.  She reports that she has quit drinking alcohol about 2 weeks ago.  She lives alone.  She has 3 children, her 2 daughters live about 5 minutes away, her son lives in [place].  She had left knee replacement surgery in 2016.  She sees rheumatology, Dr. Amil Amen.  She quit smoking some 20 years ago.  She is in physical therapy currently, goes twice a week.  She has a rolling walker with seat. She denies a family history of neuropathy but reports a family history of arthritis.   Her Past Medical History Is Significant For: Past Medical History:  Diagnosis Date  . Anticoagulant long-term use  WITH PRADAXA 05/27/2011  . Anxiety   . Cellulitis of buttock, left 12/20/2014  . Complication of anesthesia    DIFFICULTY WAKING UP  . Depression   . Diverticulosis   . DJD (degenerative joint disease)   . Esophagitis 09/17/06  . External hemorrhoids   . GERD (gastroesophageal reflux disease)   . Glaucoma   . History of stress test    a. 2008 - normal nuc.  Marland Kitchen Hypertension   . Hypothyroid   . IBS (irritable bowel syndrome)   . Paroxysmal A-fib [facility]) diagnosed 2008   a. On propafenone, Pradaxa.  . 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 BREAST REDUCTION SURGERY    . BUNIONECTOMY Right 07/09/2015   with rods and pins  . CARDIOVERSION N/A 08/02/2014   Procedure: CARDIOVERSION;  Surgeon: Sueanne Margarita, MD;   Location: [facility];  Service: Cardiovascular;  Laterality: N/A;  . CATARACT EXTRACTION, BILATERAL Bilateral   . FACIAL COSMETIC SURGERY    . FOOT SURGERY Left   . HAND SURGERY     multiple  . LIPOSUCTION    . NM MYOCAR PERF WALL MOTION  12/29/2006   No significant ischemia; EF 69%  . OVARIAN CYST REMOVAL    . PACEMAKER INSERTION  04/08/11   MDT Revo implanted by Dr Recardo Evangelist  . TONSILLECTOMY    . TOTAL KNEE ARTHROPLASTY Left 05/01/2015   Procedure: TOTAL LEFT KNEE ARTHROPLASTY;  Surgeon: Paralee Cancel, MD;  Location: [facility];  Service: Orthopedics;  Laterality: Left;  Marland Kitchen VAGINAL HYSTERECTOMY  Her Family History Is Significant For: Family History  Problem Relation Age of Onset  . Anesthesia problems Mother   . Colon polyps Mother        beign  . Colon cancer Neg Hx     Her Social History Is Significant For: Social History   Socioeconomic History  . Marital status: Divorced    Spouse name: Not on file  . Number of children: 3  . Years of education: Not on file  . Highest education level: Not on file  Occupational History  . Occupation: retired    Fish farm manager: RETIRED  Tobacco Use  . Smoking status: Former Smoker    Quit date: 08/30/1996    Years since quitting: 23.9  . Smokeless tobacco: Never Used  Substance and Sexual Activity  . Alcohol use: Yes    Alcohol/week: 6.0 standard drinks    Types: 6 Glasses of wine per week    Comment: daily  . Drug use: No  . Sexual activity: Yes  Other Topics Concern  . Not on file  Social History Narrative   Lives in [place],  Former [employer],  Separated.     Social Determinants of Health   Financial Resource Strain: Not on file  Food Insecurity: Not on file  Transportation Needs: Not on file  Physical Activity: Not on file  Stress: Not on file  Social Connections: Not on file    Her Allergies Are:  Allergies  Allergen Reactions  . Azithromycin Other (See Comments)    Causes heart rhythm issues Other  reaction(s): Other (See Comments) Causes heart rhythm issues  . Epinephrine Other (See Comments)    Causes A-Fib Other reaction(s): Increased Heart Rate (intolerance) States that it caused her heart to beat fast, so she will not take it. Causes A-Fib A-fib Triggers atrial fib   . Penicillins Anaphylaxis    Has patient had a PCN reaction causing immediate rash, facial/tongue/throat swelling, SOB or lightheadedness with hypotension: Yes Has patient had a PCN reaction causing severe rash involving mucus membranes or skin necrosis: No Has patient had a PCN reaction that required hospitalization No Has patient had a PCN reaction occurring within the last 10 years: No If all of the above answers are "NO", then may proceed with Cephalosporin use.   . Lidocaine Palpitations    Or any similar medication Other reaction(s): Other (See Comments) Or any similar medication  . Metoprolol Other (See Comments)    alopecia Other reaction(s): Other (See Comments) alopecia  :   Her Current Medications Are:  Outpatient Encounter Medications as of 07/26/2020  Medication Sig  . propafenone (RYTHMOL) 150 MG tablet TAKE ONE AND ONE-HALF TABLETS BY MOUTH EVERY MORNING, ONE TABLET BY MOUTH MID-DAY, AND ONE AND ONE-HALF TABLETS AT BEDTIME  . traZODone (DESYREL) 150 MG tablet Take by mouth at bedtime.  Marland Kitchen CARTIA XT 120 MG 24 hr capsule TAKE ONE CAPSULE BY MOUTH EVERY NIGHT AT BEDTIME  . dicyclomine (BENTYL) 10 MG capsule TAKE 1 CAPSULE BY MOUTH THREE TIMES A DAY AS NEEDED  . digoxin (LANOXIN) 0.125 MG tablet Take 0.5 tablets (0.0625 mg total) by mouth daily.  Marland Kitchen escitalopram (LEXAPRO) 20 MG tablet TAKE ONE (1) TABLET BY MOUTH EVERY DAY 90  . HYDROcodone-acetaminophen (NORCO/VICODIN) 5-325 MG tablet Take 1 tablet by mouth every 6 (six) hours as needed.  . hydroxychloroquine (PLAQUENIL) 200 MG tablet Take 200 mg by mouth 2 (two) times daily.  Marland Kitchen levothyroxine (SYNTHROID, LEVOTHROID) 112 MCG tablet Take 112 mcg  by  mouth daily before breakfast.   . [DISCONTINUED] atenolol (TENORMIN) 25 MG tablet Take 1 tablet (25 mg total) by mouth as needed (1/2 tablet to 1 tablet as needed).  . [DISCONTINUED] linaclotide (LINZESS) 72 MCG capsule Take 1 capsule (72 mcg total) by mouth daily before breakfast.   No facility-administered encounter medications on file as of 07/26/2020.  :  Review of Systems:  Out of a complete 14 point review of systems, all are reviewed and negative with the exception of these symptoms as listed below:  Review of Systems  Neurological:       Here for consult on worsening memory.     Objective:  Neurological Exam  Physical Exam Physical Examination:   Vitals:   07/26/20 1253  BP: 132/82  Pulse: 77  SpO2: 98%   General Examination: The patient is a very pleasant 75 y.o. female in no acute distress. She appears thin, well groomed.   HEENT: Normocephalic, atraumatic, pupils are equal, round and reactive to light, tracking is preserved.  Face is symmetric, normal facial animation, no hypophonia, able to provide some of her history, no carotid bruits.  Stable HEENT exam.  Neck is supple.   Chest: Clear to auscultation without wheezing, rhonchi or crackles noted.  Heart: S1+S2+0, regular and normal without murmurs, rubs or gallops noted.   Abdomen: Soft, non-tender and non-distended with normal bowel sounds appreciated on auscultation.   Extremities: There is no pitting edema in the distal lower extremities bilaterally.  Skin: Warm and dry without trophic changes noted.  Musculoskeletal: exam reveals no obvious joint deformities, tenderness or joint swelling or erythema.   Neurologically:  Mental status: The patient is awake, alert and oriented in all 4 spheres. Her immediate and remote memory, attention, language skills and fund of knowledge are fairly appropriate. There is no evidence of aphasia, agnosia, apraxia or anomia. Speech is clear with normal prosody and  enunciation. Thought process is linear. Mood is normal and affect is normal.   On 07/26/2020: MMSE: 24/30, CDT: 1/4, AFT: 5/min.  Cranial nerves II - XII are as described above under HEENT exam. In addition: shoulder shrug is normal with equal shoulder height noted. Motor exam: think bulk with global muscle thinning, no focal atrophy, no fasciculations noted. Tone is normal, global strength of approximately 4 out of 5.  I did not have her stand or walk for me today.  Romberg is not tested for safety concerns.  She is in a wheelchair.   Cerebellar testing: No dysmetria or intention tremor.  Sensory exam:  Intact to light touch.    Assessment and Plan:   In summary, Jacqueline Kim is a very pleasant 76 year old female with an underlying complex medical history of arthritis, degenerative spine disease, hypertension, A. fib, hypothyroidism, history of CHF, reflux disease, depression, glaucoma, anxiety, depression, irritable bowel syndrome, status post pacemaker placement, hyponatremia, gait disorder and recurrent falls, who presents for evaluation of her memory loss.  Symptoms have been going on for about a year.  Memory loss seems to be more advanced than should be expected for age but she does have multiple vascular risk factors and also reports a family history of memory loss.  I initially suggested to proceed with a brain MRI without contrast but realized after the patient had left them with the pacemaker in place she may not be able to get an MRI.  Unless we hear otherwise from the patient after they look on the ID card for the  pacemaker if a limited MRI of the brain can be done, I will go ahead and cancel the MRI of the brain.  She is due for blood work through [facility].  We will look out for the results and the daughter will also get [place] the results on her EMG nerve conduction testing that they had through the neurosurgeon.  We may consider medication for memory loss.  We will plan a follow-up soon.   If we cannot go ahead with the MRI, I would like to consider doing a more in-depth evaluation with the help of neuropsychologist.  We will pick up our discussion soon.  I answered all the questions before they left the clinic but we will get in touch with them regarding the MRI issue.   We talked about the importance of healthy lifestyle, good nutrition, good hydration, fall prevention, alcohol cessation again today. Thank you very much for allowing me to participate in the care of this nice patient. If I can be of any further assistance to you please do not hesitate to call me at [phone].  Sincerely,   Star Age, MD, PhD

## 2020-07-26 NOTE — Addendum Note (Signed)
Addended by: [name] on: 07/26/2020 01:54 PM   Modules accepted: Orders

## 2020-07-30 ENCOUNTER — Ambulatory Visit (INDEPENDENT_AMBULATORY_CARE_PROVIDER_SITE_OTHER): Payer: Medicare Other

## 2020-07-30 DIAGNOSIS — I495 Sick sinus syndrome: Secondary | ICD-10-CM

## 2020-07-31 ENCOUNTER — Telehealth: Payer: Self-pay

## 2020-07-31 NOTE — Telephone Encounter (Signed)
I have placed a referral to neuropsychology as an addendum to the office visit from 07/26/2020.

## 2020-07-31 NOTE — Telephone Encounter (Signed)
I called pt's daughter [name] ( ok per dpr). We discussed MRI and she confirmed pt has not had a MRI since her pacemaker was placed several years back. We further discussed the neuropsych evaluation and she reports pt is agreeable to proceeding with this. I advised I would send message to Dr. Frances Furbish.  Pt's daugther verbalized understanding and had no further questions.

## 2020-07-31 NOTE — Addendum Note (Signed)
Addended by: [name] on: 07/31/2020 12:02 PM   Modules accepted: Orders

## 2020-07-31 NOTE — Telephone Encounter (Signed)
Noted I Sent referral to Dr. Marvetta Gibbons office to schedule . Thanks Annabelle Harman

## 2020-08-01 ENCOUNTER — Telehealth: Payer: Self-pay

## 2020-08-01 LAB — CUP PACEART REMOTE DEVICE CHECK
Battery Voltage: 2.89 V
Brady Statistic AP VP Percent: 0.11 %
Brady Statistic AP VS Percent: 84.08 %
Brady Statistic AS VP Percent: 0.09 %
Brady Statistic AS VS Percent: 15.72 %
Brady Statistic RA Percent Paced: 83.12 %
Brady Statistic RV Percent Paced: 0.18 %
Date Time Interrogation Session: 20220214121806
Implantable Lead Implant Date: 20121023
Implantable Lead Implant Date: 20121023
Implantable Lead Location: 753859
Implantable Lead Location: 753860
Implantable Pulse Generator Implant Date: 20121023
Lead Channel Impedance Value: 1408 Ohm
Lead Channel Impedance Value: 392 Ohm
Lead Channel Sensing Intrinsic Amplitude: 1.608 mV
Lead Channel Sensing Intrinsic Amplitude: 7.699 mV
Lead Channel Setting Pacing Amplitude: 2 V
Lead Channel Setting Pacing Amplitude: 2.5 V
Lead Channel Setting Pacing Pulse Width: 0.6 ms
Lead Channel Setting Sensing Sensitivity: 0.9 mV

## 2020-08-01 NOTE — Telephone Encounter (Signed)
Scheduled remote tranmission received, battery is nearing ERI.  Updated transmission schedule to non-billable monthly.  Spoke with pt daughter Baxter Hire (DPR on file) advised of updated checks.

## 2020-08-02 NOTE — Progress Notes (Signed)
Remote pacemaker transmission.   

## 2020-08-04 ENCOUNTER — Emergency Department (HOSPITAL_COMMUNITY): Payer: Medicare Other

## 2020-08-04 ENCOUNTER — Inpatient Hospital Stay (HOSPITAL_COMMUNITY)
Admission: EM | Admit: 2020-08-04 | Discharge: 2020-08-06 | DRG: 689 | Disposition: A | Discharge status: Home Health | Payer: Medicare Other | Attending: Internal Medicine | Admitting: Internal Medicine

## 2020-08-04 ENCOUNTER — Other Ambulatory Visit: Payer: Self-pay

## 2020-08-04 DIAGNOSIS — Z950 Presence of cardiac pacemaker: Secondary | ICD-10-CM

## 2020-08-04 DIAGNOSIS — G9341 Metabolic encephalopathy: Secondary | ICD-10-CM

## 2020-08-04 DIAGNOSIS — N390 Urinary tract infection, site not specified: Principal | ICD-10-CM | POA: Diagnosis present

## 2020-08-04 DIAGNOSIS — R410 Disorientation, unspecified: Secondary | ICD-10-CM

## 2020-08-04 DIAGNOSIS — Z9842 Cataract extraction status, left eye: Secondary | ICD-10-CM

## 2020-08-04 DIAGNOSIS — Z87440 Personal history of urinary (tract) infections: Secondary | ICD-10-CM

## 2020-08-04 DIAGNOSIS — Z90710 Acquired absence of both cervix and uterus: Secondary | ICD-10-CM

## 2020-08-04 DIAGNOSIS — R296 Repeated falls: Secondary | ICD-10-CM | POA: Diagnosis present

## 2020-08-04 DIAGNOSIS — Z96652 Presence of left artificial knee joint: Secondary | ICD-10-CM | POA: Diagnosis present

## 2020-08-04 DIAGNOSIS — Z880 Allergy status to penicillin: Secondary | ICD-10-CM

## 2020-08-04 DIAGNOSIS — Z881 Allergy status to other antibiotic agents status: Secondary | ICD-10-CM

## 2020-08-04 DIAGNOSIS — M069 Rheumatoid arthritis, unspecified: Secondary | ICD-10-CM | POA: Diagnosis present

## 2020-08-04 DIAGNOSIS — I10 Essential (primary) hypertension: Secondary | ICD-10-CM | POA: Diagnosis present

## 2020-08-04 DIAGNOSIS — K589 Irritable bowel syndrome without diarrhea: Secondary | ICD-10-CM | POA: Diagnosis present

## 2020-08-04 DIAGNOSIS — B9689 Other specified bacterial agents as the cause of diseases classified elsewhere: Secondary | ICD-10-CM | POA: Diagnosis present

## 2020-08-04 DIAGNOSIS — Z9841 Cataract extraction status, right eye: Secondary | ICD-10-CM

## 2020-08-04 DIAGNOSIS — Z20822 Contact with and (suspected) exposure to covid-19: Secondary | ICD-10-CM | POA: Diagnosis present

## 2020-08-04 DIAGNOSIS — I480 Paroxysmal atrial fibrillation: Secondary | ICD-10-CM | POA: Diagnosis present

## 2020-08-04 DIAGNOSIS — Z8371 Family history of colonic polyps: Secondary | ICD-10-CM

## 2020-08-04 DIAGNOSIS — Z79890 Hormone replacement therapy (postmenopausal): Secondary | ICD-10-CM

## 2020-08-04 DIAGNOSIS — F32A Depression, unspecified: Secondary | ICD-10-CM | POA: Diagnosis present

## 2020-08-04 DIAGNOSIS — E039 Hypothyroidism, unspecified: Secondary | ICD-10-CM | POA: Diagnosis present

## 2020-08-04 DIAGNOSIS — F419 Anxiety disorder, unspecified: Secondary | ICD-10-CM | POA: Diagnosis present

## 2020-08-04 DIAGNOSIS — Z79899 Other long term (current) drug therapy: Secondary | ICD-10-CM

## 2020-08-04 DIAGNOSIS — Z888 Allergy status to other drugs, medicaments and biological substances status: Secondary | ICD-10-CM

## 2020-08-04 DIAGNOSIS — E860 Dehydration: Secondary | ICD-10-CM | POA: Diagnosis present

## 2020-08-04 DIAGNOSIS — G4733 Obstructive sleep apnea (adult) (pediatric): Secondary | ICD-10-CM | POA: Diagnosis present

## 2020-08-04 DIAGNOSIS — I495 Sick sinus syndrome: Secondary | ICD-10-CM | POA: Diagnosis present

## 2020-08-04 DIAGNOSIS — K219 Gastro-esophageal reflux disease without esophagitis: Secondary | ICD-10-CM | POA: Diagnosis present

## 2020-08-04 DIAGNOSIS — Z87891 Personal history of nicotine dependence: Secondary | ICD-10-CM

## 2020-08-04 LAB — COMPREHENSIVE METABOLIC PANEL
ALT: 16 U/L (ref 0–44)
AST: 25 U/L (ref 15–41)
Albumin: 4.2 g/dL (ref 3.5–5.0)
Alkaline Phosphatase: 60 U/L (ref 38–126)
Anion gap: 10 (ref 5–15)
BUN: 22 mg/dL (ref 8–23)
CO2: 23 mmol/L (ref 22–32)
Calcium: 9.3 mg/dL (ref 8.9–10.3)
Chloride: 99 mmol/L (ref 98–111)
Creatinine, Ser: 1.1 mg/dL — ABNORMAL HIGH (ref 0.44–1.00)
GFR, Estimated: 52 mL/min — ABNORMAL LOW (ref >60)
Glucose, Bld: 100 mg/dL — ABNORMAL HIGH (ref 70–99)
Potassium: 4.3 mmol/L (ref 3.5–5.1)
Sodium: 132 mmol/L — ABNORMAL LOW (ref 135–145)
Total Bilirubin: 1 mg/dL (ref 0.3–1.2)
Total Protein: 7.4 g/dL (ref 6.5–8.1)

## 2020-08-04 LAB — CBC
HCT: 39.4 % (ref 36.0–46.0)
Hemoglobin: 12.8 g/dL (ref 12.0–15.0)
MCH: 31.1 pg (ref 26.0–34.0)
MCHC: 32.5 g/dL (ref 30.0–36.0)
MCV: 95.9 fL (ref 80.0–100.0)
Platelets: 227 10*3/uL (ref 150–400)
RBC: 4.11 MIL/uL (ref 3.87–5.11)
RDW: 13.3 % (ref 11.5–15.5)
WBC: 12.7 10*3/uL — ABNORMAL HIGH (ref 4.0–10.5)
nRBC: 0 % (ref 0.0–0.2)

## 2020-08-04 LAB — URINALYSIS, ROUTINE W REFLEX MICROSCOPIC
Bilirubin Urine: NEGATIVE
Glucose, UA: NEGATIVE mg/dL
Hgb urine dipstick: NEGATIVE
Ketones, ur: 5 mg/dL — AB
Nitrite: NEGATIVE
Protein, ur: 100 mg/dL — AB
Specific Gravity, Urine: 1.016 (ref 1.005–1.030)
pH: 5 (ref 5.0–8.0)

## 2020-08-04 LAB — CBG MONITORING, ED: Glucose-Capillary: 96 mg/dL (ref 70–99)

## 2020-08-04 LAB — MAGNESIUM: Magnesium: 2 mg/dL (ref 1.7–2.4)

## 2020-08-04 LAB — ETHANOL: Alcohol, Ethyl (B): <10 mg/dL (ref <10)

## 2020-08-04 MED ORDER — SODIUM CHLORIDE 0.9 % IV SOLN
1.0000 g | Freq: Once | INTRAVENOUS | Status: AC
  Administered 2020-08-04: 1 g via INTRAVENOUS
  Filled 2020-08-04 (×2): qty 10

## 2020-08-04 MED ORDER — LACTATED RINGERS IV BOLUS
500.0000 mL | Freq: Once | INTRAVENOUS | Status: AC
  Administered 2020-08-04: 500 mL via INTRAVENOUS

## 2020-08-04 MED ORDER — ACETAMINOPHEN 325 MG PO TABS
650.0000 mg | ORAL_TABLET | Freq: Once | ORAL | Status: AC
  Administered 2020-08-04: 650 mg via ORAL
  Filled 2020-08-04: qty 2

## 2020-08-04 NOTE — ED Triage Notes (Addendum)
Patient BIB daughter. Reports went to check on patient this afternoon and patient was still asleep and confused upon waking. Patient c/o body aches. A&Ox1. A&Ox4 at baseline per daughter. Hx UTI, RA, osteoporosis.

## 2020-08-04 NOTE — Progress Notes (Signed)
Attempted IV insertion. Pt shivering and complaining about her arm being uncovered, tourniquet too tight, my hands too cold. I attempted an US guided IV but she stated that the IV hurt too bad and to take it out and that she doesn't want me to start an IV. Pt's nurse notified.

## 2020-08-04 NOTE — ED Notes (Signed)
Pt tolerating oral fluids 

## 2020-08-04 NOTE — ED Provider Notes (Signed)
[facility]-EMERGENCY DEPT Provider Note   CSN: [id number] Arrival date & time: 08/04/20  1455     History Chief Complaint  Patient presents with  . Altered Mental Status    Jacqueline Kim is a 75 y.o. female.  The history is provided by the patient, a relative and medical records.  Altered Mental Status  Jacqueline Kim is a 75 y.o. female who presents to the Emergency Department complaining of altered mental status. Level V caveat due to confusion. She presents the emergency department accompanied by her daughter for change in mental status. She lives at home alone but family checks on her daily. She was last seen to be in her routine state of health at 1 o'clock yesterday. This morning when her daughter came to check on her she was still in the bed and it appeared that she did not get out of bed this morning. When the daughter attempted to get her out of bed she seemed tremulous was unsteady unable to walk. She was complaining of body aches at that time. She seemed more confused compared to her baseline. She does have a history of memory issues, a fib, rheumatoid arthritis. She is not anticoagulated secondary to fall risk. No known sick contacts. She has been fully vaccinated for COVID-19. She does have a history of recurrent UTIs and has behaved similarly in the past secondary to UTI.    Past Medical History:  Diagnosis Date  . Anticoagulant long-term use  WITH PRADAXA 05/27/2011  . Anxiety   . Cellulitis of buttock, left 12/20/2014  . Complication of anesthesia    DIFFICULTY WAKING UP  . Depression   . Diverticulosis   . DJD (degenerative joint disease)   . Esophagitis 09/17/06  . External hemorrhoids   . GERD (gastroesophageal reflux disease)   . Glaucoma   . History of stress test    a. 2008 - normal nuc.  Marland Kitchen Hypertension   . Hypothyroid   . IBS (irritable bowel syndrome)   . Paroxysmal A-fib [facility]) diagnosed 2008   a. On propafenone, Pradaxa.  .  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 Obstructive sleep apnea of adult 06/28/2020  . 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 Advanced nonexudative age-related macular degeneration of left eye without subfoveal involvement 06/28/2020  . Hematoma of left thigh 01/21/2019  . Normocytic normochromic anemia 01/21/2019  . Hematoma 01/21/2019  . Facial laceration   . Rectal bleeding 02/01/2016  . Constipation 02/01/2016  . Dehydration 05/05/2015  . S/P left TKA 05/01/2015  . S/P knee replacement 05/01/2015  . Hypotension due to drugs 02/20/2015  . Cellulitis of left buttock 12/20/2014  . Cellulitis 12/20/2014  . AKI (acute kidney injury) (HCC) 12/20/2014  . Hyponatremia 12/20/2014  . Paroxysmal atrial fibrillation (HCC) 08/01/2014  . Irritable bowel syndrome 06/14/2014  . Fatigue 03/06/2014  . HTN (hypertension) 09/28/2013  . Atrial fibrillation with rapid ventricular response; paroxysmal 09/18/2013  . Chest pain 10/08/2012  . D-dimer, elevated, pt. on pradaxa 09/11/2011  . Chest pain, negative MI, resolved once heart rate slowed, secondary to rapid a. fib 09/11/2011  . Paroxysmal a-fib, recurrent 07/13/12- (on Propafenone)  05/27/2011  . Sinus node dysfunction (HCC) 05/27/2011  . Hypothyroid 05/27/2011  . Pacemaker [employer]onic  (  REVO) placed10/23/12 05/27/2011  . Anxiety 05/27/2011  . Chronic anticoagulation 05/27/2011    Past Surgical History:  Procedure Laterality Date  . BREAST REDUCTION SURGERY    . BUNIONECTOMY Right 07/09/2015   with rods and pins  . CARDIOVERSION N/A 08/02/2014   Procedure: CARDIOVERSION;  Surgeon: Quintella Reichert, MD;  Location: [facility] ENDOSCOPY;  Service: Cardiovascular;  Laterality: N/A;  . CATARACT EXTRACTION, BILATERAL  Bilateral   . FACIAL COSMETIC SURGERY    . FOOT SURGERY Left   . HAND SURGERY     multiple  . LIPOSUCTION    . NM MYOCAR PERF WALL MOTION  12/29/2006   No significant ischemia; EF 69%  . OVARIAN CYST REMOVAL    . PACEMAKER INSERTION  04/08/11   MDT Revo implanted by Dr Rubie Maid  . TONSILLECTOMY    . TOTAL KNEE ARTHROPLASTY Left 05/01/2015   Procedure: TOTAL LEFT KNEE ARTHROPLASTY;  Surgeon: Durene Romans, MD;  Location: [facility] ORS;  Service: Orthopedics;  Laterality: Left;  Marland Kitchen VAGINAL HYSTERECTOMY       OB History   No obstetric history on file.     Family History  Problem Relation Age of Onset  . Anesthesia problems Mother   . Colon polyps Mother        beign  . Colon cancer Neg Hx     Social History   Tobacco Use  . Smoking status: Former Smoker    Quit date: 08/30/1996    Years since quitting: 23.9  . Smokeless tobacco: Never Used  Substance Use Topics  . Alcohol use: Yes    Alcohol/week: 6.0 standard drinks    Types: 6 Glasses of wine per week    Comment: daily  . Drug use: No    Home Medications Prior to Admission medications   Medication Sig Start Date End Date Taking? Authorizing Provider  atenolol (TENORMIN) 50 MG tablet Take 50 mg by mouth daily as needed (afib).   Yes [provider]  CARTIA XT 120 MG 24 hr capsule TAKE ONE CAPSULE BY MOUTH EVERY NIGHT AT BEDTIME Patient taking differently: Take 120 mg by mouth daily. 01/19/20  Yes Croitoru, Mihai, MD  diclofenac Sodium (VOLTAREN) 1 % GEL Apply 2 g topically daily as needed (pain).   Yes [provider]  dicyclomine (BENTYL) 10 MG capsule Take 10 mg by mouth 2 (two) times daily.   Yes [provider]  digoxin (LANOXIN) 0.125 MG tablet Take 0.5 tablets (0.0625 mg total) by mouth daily. Patient taking differently: Take 0.0625 mg by mouth 2 (two) times daily. 05/15/20  Yes [name], MD  escitalopram (LEXAPRO) 20 MG tablet Take 20 mg by mouth daily.   Yes [provider]   HYDROcodone-acetaminophen (NORCO/VICODIN) 5-325 MG tablet Take 1 tablet by mouth every 6 (six) hours as needed for moderate pain. 01/11/19  Yes [provider]  hydrocortisone (ANUSOL-HC) 2.5 % rectal cream Place 1 application rectally daily as needed for hemorrhoids or anal itching.   Yes [provider]  hydroxychloroquine (PLAQUENIL) 200 MG tablet Take 200 mg by mouth 2 (two) times daily.   Yes [provider]  levothyroxine (SYNTHROID, LEVOTHROID) 112 MCG tablet Take 112 mcg by mouth daily before breakfast.    Yes [provider]  propafenone (RYTHMOL) 150 MG tablet TAKE ONE AND ONE-HALF TABLETS BY MOUTH EVERY MORNING, ONE TABLET BY MOUTH MID-DAY, AND ONE AND ONE-HALF TABLETS AT BEDTIME Patient taking differently: Take 225 mg by mouth 2 (two) times daily. 09/23/19  Yes [name], MD  traZODone (DESYREL) 150 MG tablet Take 75 mg by mouth at bedtime.   Yes [provider]    Allergies    Azithromycin, Epinephrine, Penicillins, Lidocaine, and Metoprolol  Review of Systems   Review of Systems  All other systems reviewed and are negative.   Physical Exam Updated Vital Signs BP (!) 124/49 (BP Location: Left Arm)   Pulse 76   Temp 98.7 F (37.1 C) (Oral)   Resp 15   SpO2 100%   Physical Exam Vitals and nursing note reviewed.  Constitutional:      Appearance: She is well-developed and well-nourished.  HENT:     Head: Normocephalic and atraumatic.  Cardiovascular:     Rate and Rhythm: Normal rate and regular rhythm.     Heart sounds: No murmur heard.   Pulmonary:     Effort: Pulmonary effort is normal. No respiratory distress.     Breath sounds: Normal breath sounds.  Abdominal:     Palpations: Abdomen is soft.     Tenderness: There is no abdominal tenderness. There is no guarding or rebound.  Musculoskeletal:        General: No tenderness or edema.  Skin:    General: Skin is warm and dry.  Neurological:     Mental Status:  She is alert and oriented to person, place, and time.     Comments: Five out of five strength in all four extremities with sensational light touch intact in all four extremities. Visual fields grossly intact. No asymmetry of facial movements.  Psychiatric:        Mood and Affect: Mood and affect normal.        Behavior: Behavior normal.     ED Results / Procedures / Treatments   Labs (all labs ordered are listed, but only abnormal results are displayed) Labs Reviewed  COMPREHENSIVE METABOLIC PANEL - Abnormal; Notable for the following components:      Result Value   Sodium 132 (*)    Glucose, Bld 100 (*)    Creatinine, Ser 1.10 (*)    GFR, Estimated 52 (*)    All other components within normal limits  CBC - Abnormal; Notable for the following components:   WBC 12.7 (*)    All other components within normal limits  URINALYSIS, ROUTINE W REFLEX MICROSCOPIC - Abnormal; Notable for the following components:   APPearance CLOUDY (*)    Ketones, ur 5 (*)    Protein, ur 100 (*)    Leukocytes,Ua MODERATE (*)    Bacteria, UA MANY (*)    All other components within normal limits  URINE CULTURE  SARS CORONAVIRUS 2 (TAT 6-24 HRS)  ETHANOL  MAGNESIUM  CBG MONITORING, ED    EKG None  Radiology DG Chest 2 View  Result Date: 08/04/2020 CLINICAL DATA:  Onset body aches and confusion today. EXAM: CHEST - 2 VIEW COMPARISON:  Single-view of the chest 03/05/2020. FINDINGS: Two lead pacing device is unchanged. Lungs are clear. Heart size is normal. Aortic atherosclerosis. No pneumothorax or pleural fluid. No acute or focal bony abnormality. IMPRESSION: No acute disease. Aortic Atherosclerosis (ICD10-I70.0). Electronically Signed   By: Drusilla Kanner M.D.   On: 08/04/2020 16:52   CT Head Wo Contrast  Result Date: 08/04/2020 CLINICAL DATA:  Mental status change. EXAM: CT HEAD WITHOUT CONTRAST TECHNIQUE: Contiguous axial images were obtained from the base of the skull through the vertex without  intravenous contrast. COMPARISON:  May 02, 2020 FINDINGS: Brain:  No subdural, epidural, or subarachnoid hemorrhage. Ventricles and sulci are unremarkable. Mild white matter changes. No acute cortical ischemia or infarct. No mass effect or midline shift. Cerebellum, brainstem, and basal cisterns are normal. Vascular: No hyperdense vessel or unexpected calcification. Skull: Normal. Negative for fracture or focal lesion. Sinuses/Orbits: No acute finding. Other: None. IMPRESSION: No acute intracranial abnormalities are identified. Electronically Signed   By: Gerome Sam III M.D   On: 08/04/2020 16:49    Procedures Procedures   Medications Ordered in ED Medications  cefTRIAXone (ROCEPHIN) 1 g in sodium chloride 0.9 % 100 mL IVPB (0 g Intravenous Hold 08/04/20 2254)  acetaminophen (TYLENOL) tablet 650 mg (has no administration in time range)  lactated ringers bolus 500 mL (0 mLs Intravenous Stopped 08/04/20 1832)    ED Course  I have reviewed the triage vital signs and the nursing notes.  Pertinent labs & imaging results that were available during my care of the patient were reviewed by me and considered in my medical decision making (see chart for details).    MDM Rules/Calculators/A&P                         patient with history of atrial fibrillation, dementia, recurrent UTIs here for evaluation of altered mental status. She was in her routine state of health yesterday, found to be altered from baseline today by family. On examination she is confused but non-toxic appearing with no focal neurologic deficits. CBC with mild leukocytosis. UA is consistent with UTI and she was started on antibiotics. Given that patient lives at home alone, has persistent malaise plan to admit for observation. Discussed with patient and daughter findings of studies and they are in agreement treatment plan. Hospitalist consulted for admission for ongoing treatment.  Final Clinical Impression(s) / ED  Diagnoses Final diagnoses:  Acute UTI  Delirium    Rx / DC Orders ED Discharge Orders    None       Tilden Fossa, MD 08/04/20 2304

## 2020-08-04 NOTE — ED Notes (Signed)
Patient given water for fluid challenge.  

## 2020-08-04 NOTE — ED Notes (Signed)
IV team consulted. Patient agrees to having an IV now, since she will be admitted and needs IV medication.

## 2020-08-04 NOTE — ED Notes (Signed)
Ambulated patient in room. Patient denies shortness of breath or dizziness.

## 2020-08-04 NOTE — ED Notes (Signed)
Pure Wick placed on patient for urine sample. 

## 2020-08-05 DIAGNOSIS — I10 Essential (primary) hypertension: Secondary | ICD-10-CM | POA: Diagnosis not present

## 2020-08-05 DIAGNOSIS — R410 Disorientation, unspecified: Secondary | ICD-10-CM | POA: Diagnosis present

## 2020-08-05 DIAGNOSIS — E039 Hypothyroidism, unspecified: Secondary | ICD-10-CM

## 2020-08-05 DIAGNOSIS — F32A Depression, unspecified: Secondary | ICD-10-CM | POA: Diagnosis present

## 2020-08-05 DIAGNOSIS — B9689 Other specified bacterial agents as the cause of diseases classified elsewhere: Secondary | ICD-10-CM | POA: Diagnosis not present

## 2020-08-05 DIAGNOSIS — G9341 Metabolic encephalopathy: Secondary | ICD-10-CM

## 2020-08-05 DIAGNOSIS — N390 Urinary tract infection, site not specified: Secondary | ICD-10-CM | POA: Diagnosis not present

## 2020-08-05 DIAGNOSIS — F419 Anxiety disorder, unspecified: Secondary | ICD-10-CM | POA: Diagnosis present

## 2020-08-05 DIAGNOSIS — Z9841 Cataract extraction status, right eye: Secondary | ICD-10-CM | POA: Diagnosis not present

## 2020-08-05 DIAGNOSIS — K219 Gastro-esophageal reflux disease without esophagitis: Secondary | ICD-10-CM | POA: Diagnosis present

## 2020-08-05 DIAGNOSIS — I495 Sick sinus syndrome: Secondary | ICD-10-CM | POA: Diagnosis not present

## 2020-08-05 DIAGNOSIS — Z881 Allergy status to other antibiotic agents status: Secondary | ICD-10-CM | POA: Diagnosis not present

## 2020-08-05 DIAGNOSIS — Z880 Allergy status to penicillin: Secondary | ICD-10-CM | POA: Diagnosis not present

## 2020-08-05 DIAGNOSIS — G4733 Obstructive sleep apnea (adult) (pediatric): Secondary | ICD-10-CM | POA: Diagnosis present

## 2020-08-05 DIAGNOSIS — I480 Paroxysmal atrial fibrillation: Secondary | ICD-10-CM

## 2020-08-05 DIAGNOSIS — Z90710 Acquired absence of both cervix and uterus: Secondary | ICD-10-CM | POA: Diagnosis not present

## 2020-08-05 DIAGNOSIS — Z9842 Cataract extraction status, left eye: Secondary | ICD-10-CM | POA: Diagnosis not present

## 2020-08-05 DIAGNOSIS — M069 Rheumatoid arthritis, unspecified: Secondary | ICD-10-CM | POA: Diagnosis not present

## 2020-08-05 DIAGNOSIS — K589 Irritable bowel syndrome without diarrhea: Secondary | ICD-10-CM | POA: Diagnosis not present

## 2020-08-05 DIAGNOSIS — Z79899 Other long term (current) drug therapy: Secondary | ICD-10-CM | POA: Diagnosis not present

## 2020-08-05 DIAGNOSIS — Z20822 Contact with and (suspected) exposure to covid-19: Secondary | ICD-10-CM | POA: Diagnosis not present

## 2020-08-05 DIAGNOSIS — Z950 Presence of cardiac pacemaker: Secondary | ICD-10-CM | POA: Diagnosis not present

## 2020-08-05 DIAGNOSIS — Z79890 Hormone replacement therapy (postmenopausal): Secondary | ICD-10-CM | POA: Diagnosis not present

## 2020-08-05 DIAGNOSIS — Z96652 Presence of left artificial knee joint: Secondary | ICD-10-CM | POA: Diagnosis present

## 2020-08-05 DIAGNOSIS — Z888 Allergy status to other drugs, medicaments and biological substances status: Secondary | ICD-10-CM | POA: Diagnosis not present

## 2020-08-05 LAB — CBC
HCT: 35.6 % — ABNORMAL LOW (ref 36.0–46.0)
Hemoglobin: 11.9 g/dL — ABNORMAL LOW (ref 12.0–15.0)
MCH: 31.5 pg (ref 26.0–34.0)
MCHC: 33.4 g/dL (ref 30.0–36.0)
MCV: 94.2 fL (ref 80.0–100.0)
Platelets: 187 10*3/uL (ref 150–400)
RBC: 3.78 MIL/uL — ABNORMAL LOW (ref 3.87–5.11)
RDW: 13.2 % (ref 11.5–15.5)
WBC: 10.3 10*3/uL (ref 4.0–10.5)
nRBC: 0 % (ref 0.0–0.2)

## 2020-08-05 LAB — VITAMIN B12: Vitamin B-12: 230 pg/mL (ref 180–914)

## 2020-08-05 LAB — TSH: TSH: 1.064 u[IU]/mL (ref 0.350–4.500)

## 2020-08-05 LAB — SARS CORONAVIRUS 2 (TAT 6-24 HRS): SARS Coronavirus 2: NEGATIVE

## 2020-08-05 LAB — AMMONIA: Ammonia: 23 umol/L (ref 9–35)

## 2020-08-05 LAB — TROPONIN I (HIGH SENSITIVITY): Troponin I (High Sensitivity): 34 ng/L — ABNORMAL HIGH (ref <18)

## 2020-08-05 MED ORDER — DILTIAZEM HCL ER COATED BEADS 120 MG PO CP24
120.0000 mg | ORAL_CAPSULE | Freq: Every day | ORAL | Status: DC
  Administered 2020-08-05 – 2020-08-06 (×2): 120 mg via ORAL
  Filled 2020-08-05 (×2): qty 1

## 2020-08-05 MED ORDER — ACETAMINOPHEN 325 MG PO TABS
650.0000 mg | ORAL_TABLET | Freq: Four times a day (QID) | ORAL | Status: DC | PRN
  Administered 2020-08-05 – 2020-08-06 (×2): 650 mg via ORAL
  Filled 2020-08-05 (×2): qty 2

## 2020-08-05 MED ORDER — SODIUM CHLORIDE 0.9 % IV SOLN
1.0000 g | INTRAVENOUS | Status: DC
  Filled 2020-08-05: qty 10

## 2020-08-05 MED ORDER — ENOXAPARIN SODIUM 40 MG/0.4ML ~~LOC~~ SOLN
40.0000 mg | SUBCUTANEOUS | Status: DC
  Administered 2020-08-05: 40 mg via SUBCUTANEOUS
  Filled 2020-08-05 (×2): qty 0.4

## 2020-08-05 MED ORDER — SODIUM CHLORIDE 0.9 % IV SOLN: INTRAVENOUS | Status: AC

## 2020-08-05 MED ORDER — PROPAFENONE HCL 225 MG PO TABS
225.0000 mg | ORAL_TABLET | Freq: Two times a day (BID) | ORAL | Status: DC
  Administered 2020-08-05 – 2020-08-06 (×4): 225 mg via ORAL
  Filled 2020-08-05 (×4): qty 1

## 2020-08-05 MED ORDER — HYDROXYCHLOROQUINE SULFATE 200 MG PO TABS
200.0000 mg | ORAL_TABLET | Freq: Two times a day (BID) | ORAL | Status: DC
  Administered 2020-08-05 – 2020-08-06 (×3): 200 mg via ORAL
  Filled 2020-08-05 (×3): qty 1

## 2020-08-05 MED ORDER — LEVOTHYROXINE SODIUM 112 MCG PO TABS
112.0000 ug | ORAL_TABLET | Freq: Every day | ORAL | Status: DC
  Administered 2020-08-05 – 2020-08-06 (×2): 112 ug via ORAL
  Filled 2020-08-05 (×2): qty 1

## 2020-08-05 MED ORDER — ESCITALOPRAM OXALATE 20 MG PO TABS
20.0000 mg | ORAL_TABLET | Freq: Every day | ORAL | Status: DC
  Administered 2020-08-05 – 2020-08-06 (×2): 20 mg via ORAL
  Filled 2020-08-05 (×2): qty 1

## 2020-08-05 MED ORDER — DIGOXIN 0.0625 MG HALF TABLET
0.0625 mg | ORAL_TABLET | Freq: Two times a day (BID) | ORAL | Status: DC
  Administered 2020-08-05 – 2020-08-06 (×4): 0.0625 mg via ORAL
  Filled 2020-08-05 (×4): qty 1

## 2020-08-05 MED ORDER — TRAZODONE HCL 50 MG PO TABS
75.0000 mg | ORAL_TABLET | Freq: Every day | ORAL | Status: DC
  Administered 2020-08-05: 75 mg via ORAL
  Filled 2020-08-05: qty 2

## 2020-08-05 MED ORDER — DIGOXIN 125 MCG PO TABS
0.0625 mg | ORAL_TABLET | Freq: Two times a day (BID) | ORAL | Status: DC
  Filled 2020-08-05: qty 0.5

## 2020-08-05 MED ORDER — ACETAMINOPHEN 650 MG RE SUPP: 650.0000 mg | Freq: Four times a day (QID) | RECTAL | Status: DC | PRN

## 2020-08-05 MED ORDER — DICYCLOMINE HCL 10 MG PO CAPS
10.0000 mg | ORAL_CAPSULE | Freq: Two times a day (BID) | ORAL | Status: DC
  Administered 2020-08-05 – 2020-08-06 (×4): 10 mg via ORAL
  Filled 2020-08-05 (×4): qty 1

## 2020-08-05 NOTE — Evaluation (Signed)
Physical Therapy Evaluation Patient Details Name: Jacqueline Kim MRN: [id number] DOB: 10/03/1945 Today's Date: 08/05/2020   History of Present Illness  75 y.o. female with medical history significant of paroxysmal A. fib not on anticoagulation due to fall risk, sinus node dysfunction/sick sinus syndrome status post PPM,  hypertension, hypothyroidism, IBS, GERD, gait disorder and recurrent falls, rheumatoid arthritis, recurrent UTIs, seen by neurology 10 days ago for evaluation of memory loss presenting to the ED for evaluation of confusion and generalized weakness.  Clinical Impression  Pt admitted with above diagnosis. Pt agreeable to OOB and amb, denies falls at home. Pt oriented to place and self, demonstrates poor immediate memory, repeatedly asking the time of day. Pt may need SNF if cognition does not improve and if pt/family agreeable, do not think family can provide 24 hour assist. Continue to follow   Pt currently with functional limitations due to the deficits listed below (see PT Problem List). Pt will benefit from skilled PT to increase their independence and safety with mobility to allow discharge to the venue listed below.       Follow Up Recommendations Home health PT;SNF (pending progress/cognition)    Equipment Recommendations  None recommended by PT    Recommendations for Other Services       Precautions / Restrictions Precautions Precautions: Fall Restrictions Weight Bearing Restrictions: No      Mobility  Bed Mobility Overal bed mobility: Needs Assistance Bed Mobility: Supine to Sit     Supine to sit: Supervision     General bed mobility comments: for safety    Transfers Overall transfer level: Needs assistance Equipment used: None Transfers: Sit to/from Stand Sit to Stand: Min guard         General transfer comment: for safety  Ambulation/Gait Ambulation/Gait assistance: Min guard;Min assist Gait Distance (Feet): 160 Feet Assistive device:  None Gait Pattern/deviations: Step-through pattern;Decreased stride length;Wide base of support     General Gait Details: overall unsteady gait, compensating with wide BOS and occasionally reaching for bed rail or foot board  Stairs            Wheelchair Mobility    Modified Rankin (Stroke Patients Only)       Balance Overall balance assessment: Needs assistance (pt denies falls, chart and old notes state pt with mutliple falls) Sitting-balance support: Feet supported;No upper extremity supported Sitting balance-Leahy Scale: Fair     Standing balance support: No upper extremity supported;During functional activity Standing balance-Leahy Scale: Fair Standing balance comment: unsteady with wt shifting out of BOS without UE support                             Pertinent Vitals/Pain Pain Assessment: No/denies pain    Home Living Family/patient expects to be discharged to:: Private residence Living Arrangements: Alone Available Help at Discharge: Family;Available PRN/intermittently Type of Home: House Home Access: Stairs to enter   Entrance Stairs-Number of Steps: 1 Home Layout: One level Home Equipment: Bedside commode;Walker - 4 wheels Additional Comments: both dtrs work, check in on pt frequently    Prior Function     Gait / Transfers Assistance Needed: chart notes falls. pt denies. pt denies using AD to amb; previous notes state pt uses rollator  ADL's / Homemaking Assistance Needed: per previous notes family assists with ADLs and household tasks        Hand Dominance        Extremity/Trunk Assessment   Upper  Extremity Assessment Upper Extremity Assessment: Defer to OT evaluation    Lower Extremity Assessment Lower Extremity Assessment: Generalized weakness       Communication      Cognition Arousal/Alertness: Awake/alert Behavior During Therapy: WFL for tasks assessed/performed Overall Cognitive Status: Impaired/Different from  baseline Area of Impairment: Memory;Orientation                 Orientation Level: Disoriented to;Time   Memory: Decreased short-term memory         General Comments: decr immediate memory, pt repeatedly asks time of day      General Comments      Exercises     Assessment/Plan    PT Assessment Patient needs continued PT services  PT Problem List Decreased strength;Decreased range of motion;Decreased mobility;Decreased activity tolerance;Decreased balance;Decreased safety awareness;Decreased cognition       PT Treatment Interventions DME instruction;Therapeutic activities;Gait training;Functional mobility training;Therapeutic exercise;Patient/family education;Balance training    PT Goals (Current goals can be found in the Care Plan section)  Acute Rehab PT Goals Patient Stated Goal: wants to go home PT Goal Formulation: Patient unable to participate in goal setting Time For Goal Achievement: 08/19/20 Potential to Achieve Goals: Good    Frequency Min 3X/week   Barriers to discharge        Co-evaluation               AM-PAC PT "6 Clicks" Mobility  Outcome Measure Help needed turning from your back to your side while in a flat bed without using bedrails?: A Little Help needed moving from lying on your back to sitting on the side of a flat bed without using bedrails?: A Little Help needed moving to and from a bed to a chair (including a wheelchair)?: A Little Help needed standing up from a chair using your arms (e.g., wheelchair or bedside chair)?: A Little Help needed to walk in hospital room?: A Little Help needed climbing 3-5 steps with a railing? : A Little 6 Click Score: 18    End of Session Equipment Utilized During Treatment: Gait belt Activity Tolerance: Patient tolerated treatment well Patient left: with call bell/phone within reach;in bed;with bed alarm set Nurse Communication: Mobility status PT Visit Diagnosis: Other abnormalities of gait  and mobility (R26.89)    Time: 3976-7341 PT Time Calculation (min) (ACUTE ONLY): 15 min   Charges:   PT Evaluation $PT Eval Low Complexity: 1 Low          Nigel Ericsson, PT  Acute Rehab Dept ([facility]) [phone] Pager [phone]  08/05/2020   [facility] 08/05/2020, 5:02 PM

## 2020-08-05 NOTE — H&P (Signed)
History and Physical    Jacqueline Kim QMV:[id number] DOB: 04-May-1946 DOA: 08/04/2020  PCP: Georgianne Fick, MD Patient coming from: Home  Chief Complaint: Confusion  HPI: Jacqueline Kim is a 75 y.o. female with medical history significant of paroxysmal A. fib not on anticoagulation due to fall risk, sinus node dysfunction/sick sinus syndrome status post PPM,  hypertension, hypothyroidism, IBS, GERD, gait disorder and recurrent falls, rheumatoid arthritis, recurrent UTIs, seen by neurology 10 days ago for evaluation of memory loss presenting to the ED for evaluation of confusion and generalized weakness. Patient AAO x1 on arrival to the ED and family reported that she is AAOx4 at baseline.  Patient is oriented to self only and not sure why she is here. She has no complaints. Denies fevers, chills, cough, shortness of breath, chest pain, nausea, vomiting, abdominal pain, diarrhea, or dysuria.  ED Course: Vital signs stable. WBC 12.7, hemoglobin 12.8, platelet count 227K. Sodium 132, potassium 4.3, chloride 99, bicarb 23, BUN 22, creatinine 1.1, glucose 100. Magnesium level normal. Blood ethanol level undetectable. UA with moderate amount of leukocytes, 21-50 WBCs, and many bacteria. Urine culture pending. SARS-CoV-2 PCR test pending. Chest x-ray not suggestive of pneumonia. Head CT negative for acute intracranial abnormality. Patient was given Tylenol, ceftriaxone, and 500 cc LR bolus.  Review of Systems:  All systems reviewed and apart from history of presenting illness, are negative.  Past Medical History:  Diagnosis Date  . Anticoagulant long-term use  WITH PRADAXA 05/27/2011  . Anxiety   . Cellulitis of buttock, left 12/20/2014  . Complication of anesthesia    DIFFICULTY WAKING UP  . Depression   . Diverticulosis   . DJD (degenerative joint disease)   . Esophagitis 09/17/06  . External hemorrhoids   . GERD (gastroesophageal reflux disease)   . Glaucoma   . History of stress test     a. 2008 - normal nuc.  Marland Kitchen Hypertension   . Hypothyroid   . IBS (irritable bowel syndrome)   . Paroxysmal A-fib [facility]) diagnosed 2008   a. On propafenone, Pradaxa.  . 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 BREAST REDUCTION SURGERY    . BUNIONECTOMY Right 07/09/2015   with rods and pins  . CARDIOVERSION N/A 08/02/2014   Procedure: CARDIOVERSION;  Surgeon: Quintella Reichert, MD;  Location: [facility] ENDOSCOPY;  Service: Cardiovascular;  Laterality: N/A;  . CATARACT EXTRACTION, BILATERAL Bilateral   . FACIAL COSMETIC SURGERY    . FOOT SURGERY Left   . HAND SURGERY     multiple  . LIPOSUCTION    . NM MYOCAR PERF WALL MOTION  12/29/2006   No significant ischemia; EF 69%  . OVARIAN CYST REMOVAL    . PACEMAKER INSERTION  04/08/11   MDT Revo implanted by Dr Rubie Maid  . TONSILLECTOMY    . TOTAL KNEE ARTHROPLASTY Left 05/01/2015   Procedure: TOTAL LEFT KNEE ARTHROPLASTY;  Surgeon: Durene Romans, MD;  Location: [facility] ORS;  Service: Orthopedics;  Laterality: Left;  Marland Kitchen VAGINAL HYSTERECTOMY       reports that she quit smoking about 23 years ago. She has never used smokeless tobacco. She reports current alcohol use of about 6.0 standard drinks of alcohol per week. She reports that she does not use drugs.  Allergies  Allergen Reactions  . Azithromycin Other (See Comments)  Causes heart rhythm issues Other reaction(s): Other (See Comments) Causes heart rhythm issues  . Epinephrine Other (See Comments)    Causes A-Fib Other reaction(s): Increased Heart Rate (intolerance) States that it caused her heart to beat fast, so she will not take it. Causes A-Fib A-fib Triggers atrial fib   . Penicillins Anaphylaxis    Has patient had a PCN reaction causing immediate rash, facial/tongue/throat swelling, SOB or lightheadedness with hypotension: Yes Has  patient had a PCN reaction causing severe rash involving mucus membranes or skin necrosis: No Has patient had a PCN reaction that required hospitalization No Has patient had a PCN reaction occurring within the last 10 years: No If all of the above answers are "NO", then may proceed with Cephalosporin use.   . Lidocaine Palpitations    Or any similar medication Other reaction(s): Other (See Comments) Or any similar medication  . Metoprolol Other (See Comments)    alopecia Other reaction(s): Other (See Comments) alopecia    Family History  Problem Relation Age of Onset  . Anesthesia problems Mother   . Colon polyps Mother        beign  . Colon cancer Neg Hx     Prior to Admission medications   Medication Sig Start Date End Date Taking? Authorizing Provider  atenolol (TENORMIN) 50 MG tablet Take 50 mg by mouth daily as needed (afib).   Yes [provider]  CARTIA XT 120 MG 24 hr capsule TAKE ONE CAPSULE BY MOUTH EVERY NIGHT AT BEDTIME Patient taking differently: Take 120 mg by mouth daily. 01/19/20  Yes Croitoru, Mihai, MD  diclofenac Sodium (VOLTAREN) 1 % GEL Apply 2 g topically daily as needed (pain).   Yes [provider]  dicyclomine (BENTYL) 10 MG capsule Take 10 mg by mouth 2 (two) times daily.   Yes [provider]  digoxin (LANOXIN) 0.125 MG tablet Take 0.5 tablets (0.0625 mg total) by mouth daily. Patient taking differently: Take 0.0625 mg by mouth 2 (two) times daily. 05/15/20  Yes [name], MD  escitalopram (LEXAPRO) 20 MG tablet Take 20 mg by mouth daily.   Yes [provider]  HYDROcodone-acetaminophen (NORCO/VICODIN) 5-325 MG tablet Take 1 tablet by mouth every 6 (six) hours as needed for moderate pain. 01/11/19  Yes [provider]  hydrocortisone (ANUSOL-HC) 2.5 % rectal cream Place 1 application rectally daily as needed for hemorrhoids or anal itching.   Yes [provider]  hydroxychloroquine (PLAQUENIL) 200  MG tablet Take 200 mg by mouth 2 (two) times daily.   Yes [provider]  levothyroxine (SYNTHROID, LEVOTHROID) 112 MCG tablet Take 112 mcg by mouth daily before breakfast.    Yes [provider]  propafenone (RYTHMOL) 150 MG tablet TAKE ONE AND ONE-HALF TABLETS BY MOUTH EVERY MORNING, ONE TABLET BY MOUTH MID-DAY, AND ONE AND ONE-HALF TABLETS AT BEDTIME Patient taking differently: Take 225 mg by mouth 2 (two) times daily. 09/23/19  Yes [name], MD  traZODone (DESYREL) 150 MG tablet Take 75 mg by mouth at bedtime.   Yes [provider]    Physical Exam: Vitals:   08/04/20 2322 08/04/20 2347 08/05/20 0018 08/05/20 0038  BP:  (!) 136/94 (!) 136/94 138/60  Pulse: 74 73 70 71  Resp: 18 15 16 20   Temp:   98.6 F (37 C) 98.4 F (36.9 C)  TempSrc:   Oral Oral  SpO2: 93% 98% 98% 98%    Physical Exam Constitutional:  General: She is not in acute distress. HENT:     Head: Normocephalic and atraumatic.     Mouth/Throat:     Mouth: Mucous membranes are dry.  Eyes:     Extraocular Movements: Extraocular movements intact.     Conjunctiva/sclera: Conjunctivae normal.  Cardiovascular:     Rate and Rhythm: Normal rate and regular rhythm.     Pulses: Normal pulses.  Pulmonary:     Effort: Pulmonary effort is normal. No respiratory distress.     Breath sounds: Normal breath sounds. No wheezing or rales.  Abdominal:     General: Bowel sounds are normal. There is no distension.     Palpations: Abdomen is soft.     Tenderness: There is no abdominal tenderness.  Musculoskeletal:        General: No swelling or tenderness.     Cervical back: Normal range of motion and neck supple.  Skin:    General: Skin is warm and dry.  Neurological:     General: No focal deficit present.     Mental Status: She is alert.     Sensory: No sensory deficit.     Motor: No weakness.     Comments: Oriented to self only Following commands appropriately     Labs on  Admission: I have personally reviewed following labs and imaging studies  CBC: Recent Labs  Lab 08/04/20 1537  WBC 12.7*  HGB 12.8  HCT 39.4  MCV 95.9  PLT 227   Basic Metabolic Panel: Recent Labs  Lab 08/04/20 1537 08/04/20 1606  NA 132*  --   K 4.3  --   CL 99  --   CO2 23  --   GLUCOSE 100*  --   BUN 22  --   CREATININE 1.10*  --   CALCIUM 9.3  --   MG  --  2.0   GFR: Estimated Creatinine Clearance: 38.5 mL/min (A) (by C-G formula based on SCr of 1.1 mg/dL (H)). Liver Function Tests: Recent Labs  Lab 08/04/20 1537  AST 25  ALT 16  ALKPHOS 60  BILITOT 1.0  PROT 7.4  ALBUMIN 4.2   No results for input(s): LIPASE, AMYLASE in the last 168 hours. No results for input(s): AMMONIA in the last 168 hours. Coagulation Profile: No results for input(s): INR, PROTIME in the last 168 hours. Cardiac Enzymes: No results for input(s): CKTOTAL, CKMB, CKMBINDEX, TROPONINI in the last 168 hours. BNP (last 3 results) No results for input(s): PROBNP in the last 8760 hours. HbA1C: No results for input(s): HGBA1C in the last 72 hours. CBG: Recent Labs  Lab 08/04/20 1608  GLUCAP 96   Lipid Profile: No results for input(s): CHOL, HDL, LDLCALC, TRIG, CHOLHDL, LDLDIRECT in the last 72 hours. Thyroid Function Tests: No results for input(s): TSH, T4TOTAL, FREET4, T3FREE, THYROIDAB in the last 72 hours. Anemia Panel: No results for input(s): VITAMINB12, FOLATE, FERRITIN, TIBC, IRON, RETICCTPCT in the last 72 hours. Urine analysis:    Component Value Date/Time   COLORURINE YELLOW 08/04/2020 2139   APPEARANCEUR CLOUDY (A) 08/04/2020 2139   LABSPEC 1.016 08/04/2020 2139   PHURINE 5.0 08/04/2020 2139   GLUCOSEU NEGATIVE 08/04/2020 2139   HGBUR NEGATIVE 08/04/2020 2139   BILIRUBINUR NEGATIVE 08/04/2020 2139   KETONESUR 5 (A) 08/04/2020 2139   PROTEINUR 100 (A) 08/04/2020 2139   UROBILINOGEN 0.2 04/24/2015 1423   NITRITE NEGATIVE 08/04/2020 2139   LEUKOCYTESUR MODERATE (A)  08/04/2020 2139    Radiological Exams on Admission: DG Chest  2 View  Result Date: 08/04/2020 CLINICAL DATA:  Onset body aches and confusion today. EXAM: CHEST - 2 VIEW COMPARISON:  Single-view of the chest 03/05/2020. FINDINGS: Two lead pacing device is unchanged. Lungs are clear. Heart size is normal. Aortic atherosclerosis. No pneumothorax or pleural fluid. No acute or focal bony abnormality. IMPRESSION: No acute disease. Aortic Atherosclerosis (ICD10-I70.0). Electronically Signed   By: Drusilla Kanner M.D.   On: 08/04/2020 16:52   CT Head Wo Contrast  Result Date: 08/04/2020 CLINICAL DATA:  Mental status change. EXAM: CT HEAD WITHOUT CONTRAST TECHNIQUE: Contiguous axial images were obtained from the base of the skull through the vertex without intravenous contrast. COMPARISON:  May 02, 2020 FINDINGS: Brain: No subdural, epidural, or subarachnoid hemorrhage. Ventricles and sulci are unremarkable. Mild white matter changes. No acute cortical ischemia or infarct. No mass effect or midline shift. Cerebellum, brainstem, and basal cisterns are normal. Vascular: No hyperdense vessel or unexpected calcification. Skull: Normal. Negative for fracture or focal lesion. Sinuses/Orbits: No acute finding. Other: None. IMPRESSION: No acute intracranial abnormalities are identified. Electronically Signed   By: Gerome Sam III M.D   On: 08/04/2020 16:49   EKG: No EKG done in the ED.  EKG ordered and currently pending.  Assessment/Plan Principal Problem:   UTI (urinary tract infection) Active Problems:   Hypothyroid   HTN (hypertension)   Paroxysmal atrial fibrillation (HCC)   Acute metabolic encephalopathy   UTI: UA with moderate amount of leukocytes, 21-50 WBCs, and many bacteria. Labs showing mild leukocytosis. No fever or signs of sepsis. -Penicillin allergy listed in the chart but patient is able to tolerate cephalosporins. She received ceftriaxone in the ED with no adverse reaction, continue  ceftriaxone. Urine culture pending. Continue to monitor WBC count.  Acute metabolic encephalopathy: Likely due to UTI and mild dehydration also likely contributing as creatinine is slightly up from baseline. Blood ethanol level undetectable. Head CT negative for acute intracranial abnormality. Neuro exam nonfocal. Family reported that she is AAOx4 at baseline but at present oriented to self only, however, following commands appropriately. No documented history of dementia but neurology did see her about 10 days ago for evaluation of memory loss. -IV fluid hydration. Continue treatment of UTI. Check B12, TSH, and ammonia levels.  Paroxysmal A. Fib: Seen by Dr. [name]. Not on anticoagulation due to fall risk. Not tachycardic at present. -EKG ordered. Continue diltiazem, digoxin, and propafenone.  Sinus node dysfunction/sick sinus syndrome: Status post PPM.  Generalized weakness: Likely due to UTI. -PT/OT eval, fall precautions. Covid test pending, continue airborne and contact precautions.  Hypertension: Stable. -Continue diltiazem  Hypothyroidism -Continue Synthroid. Check TSH level.  IBS -Continue Bentyl  Rheumatoid arthritis -Continue Plaquenil  Depression -Continue Escitalopram and trazodone  DVT prophylaxis: Lovenox Code Status: Full code Family Communication: No family available at this time. Disposition Plan: Status is: Observation  The patient remains OBS appropriate and will d/c before 2 midnights.  Dispo: The patient is from: Home              Anticipated d/c is to: Home              Anticipated d/c date is: 2 days              Patient currently is not medically stable to d/c.   Difficult to place patient No  Level of care: Level of care: Med-Surg   The medical decision making on this patient was of high complexity and the patient is at high  risk for clinical deterioration, therefore this is a level 3 visit.  John Giovanni MD [facility]  If 7PM-7AM,  please contact night-coverage [email or web address]  08/05/2020, 1:49 AM

## 2020-08-05 NOTE — Progress Notes (Signed)
PROGRESS NOTE    Jacqueline Kim  ZOX:[id number] DOB: 1946-02-01 DOA: 08/04/2020 PCP: Georgianne Fick, MD    Brief Narrative:  Jacqueline Kim is a 75 y.o. female with medical history significant of paroxysmal A. fib not on anticoagulation due to fall risk, sinus node dysfunction/sick sinus syndrome status post PPM,  hypertension, hypothyroidism, IBS, GERD, gait disorder and recurrent falls, rheumatoid arthritis, recurrent UTIs, seen by neurology 10 days ago for evaluation of memory loss presenting to the ED for evaluation of confusion and generalized weakness. Patient AAO x1 on arrival to the ED and family reported that she is AAOx4 at baseline.  In the ED, temperature 98.1, HR 62, RR 16, BP 104/62, SPO2 93% on room air.  WBC 12.7, hemoglobin 12.8, platelet count 227K. Sodium 132, potassium 4.3, chloride 99, bicarb 23, BUN 22, creatinine 1.1, glucose 100. Magnesium level normal. Blood ethanol level undetectable. UA with moderate amount of leukocytes, 21-50 WBCs, and many bacteria. Urine culture pending. SARS-CoV-2 PCR test pending. Chest x-ray not suggestive of pneumonia. Head CT negative for acute intracranial abnormality. Patient was given Tylenol, ceftriaxone, and 500 cc LR bolus.  Hospital service was consulted for further evaluation and management of acute metabolic encephalopathy likely secondary to UTI.   Assessment & Plan:   Principal Problem:   UTI (urinary tract infection) Active Problems:   Hypothyroid   HTN (hypertension)   Paroxysmal atrial fibrillation (HCC)   Acute metabolic encephalopathy   Acute metabolic encephalopathy, POA Urinary tract infection Patient presenting to the ED with confusion and generalized weakness.  Patient is afebrile, WBC count slightly elevated 12.7.  Covid-19 PCR negative.  EtOH level less than 10 and ammonia 23 which is within normal limits.  CT head and chest x-ray with no acute findings.  Urinalysis with moderate leukoesterase, negative nitrite,  many bacteria and 21-50 RBCs.  Patient with history of UTI in the past with E. coli/Klebsiella and likely etiology to her underlying confusion. --Urine culture pending --Ceftriaxone 1 g IV every 24 hours --Mentation now appears to be back at normal baseline --Supportive care  Paroxysmal atrial fibrillation:  Follows with cardiology outpatient, Dr. [name]. Not on anticoagulation due to fall risk. Not tachycardic at present. --Continue diltiazem CD 120 mg p.o. daily, digoxin 0.0625mg  BID, and propafenone 225mg  BID.  Sinus node dysfunction/sick sinus syndrome: Status post PPM.  Generalized weakness:  Likely due to UTI.  Patient does receive home health with aides 3 times a week. --PT/OT eval --fall precautions.   Hypertension:  BP 140/69 this morning, stable --Continue diltiazem CD 120mg  PO daily  Hypothyroidism --TSH pending --Continue Synthroid PO daily  IBS --Continue Bentyl 10 mg p.o. twice daily  Rheumatoid arthritis --Continue Plaquenil  Depression --Continue Escitalopram 20mg  PO daily and trazodone 75mg  PO daily   DVT prophylaxis: Lovenox   Code Status: Full Code Family Communication: Updated patient's daughter via telephone this morning.  Disposition Plan: Level of care: Med-Surg Status is: Observation  The patient remains OBS appropriate and will d/c before 2 midnights.  Dispo: The patient is from: Home              Anticipated d/c is to: Home              Anticipated d/c date is: 1 day              Patient currently is medically stable to d/c.   Difficult to place patient No   Consultants:   None  Procedures:  None  Antimicrobials:   Ceftriaxone 2/19>>    Subjective: Patient seen and examined bedside, resting comfortably.  No specific complaints this morning other than mild weakness and fatigue.  Confusion now resolved and alert and oriented x4.  Updated patient's daughter, [name] via telephone this morning.  No other  questions or concerns at this time.  Denies headache, no fever/chills/night sweats, no nausea/vomiting/diarrhea, no chest pain, palpitations, no shortness of breath, no abdominal pain, no paresthesias.  No acute events overnight per nursing staff.  Objective: Vitals:   08/05/20 0038 08/05/20 0226 08/05/20 0438 08/05/20 0907  BP: 138/60  140/69 131/76  Pulse: 71 67 67 76  Resp: 20  (!) 22 14  Temp: 98.4 F (36.9 C)  98.9 F (37.2 C) 98.5 F (36.9 C)  TempSrc: Oral  Oral Oral  SpO2: 98%  97% 98%    Intake/Output Summary (Last 24 hours) at 08/05/2020 1024 Last data filed at 08/05/2020 0553 Gross per 24 hour  Intake 662 ml  Output 1 ml  Net 661 ml   There were no vitals filed for this visit.  Examination:  General exam: Appears calm and comfortable, elderly in appearance Respiratory system: Clear to auscultation. Respiratory effort normal.  Oxygenating well on room air Cardiovascular system: S1 & S2 heard, RRR. No JVD, murmurs, rubs, gallops or clicks. No pedal edema. Gastrointestinal system: Abdomen is nondistended, soft and nontender. No organomegaly or masses felt. Normal bowel sounds heard. Central nervous system: Alert and oriented. No focal neurological deficits. Extremities: Symmetric 5 x 5 power. Skin: No rashes, lesions or ulcers Psychiatry: Judgement and insight appear normal. Mood & affect appropriate.     Data Reviewed: I have personally reviewed following labs and imaging studies  CBC: Recent Labs  Lab 08/04/20 1537 08/05/20 0833  WBC 12.7* 10.3  HGB 12.8 11.9*  HCT 39.4 35.6*  MCV 95.9 94.2  PLT 227 187   Basic Metabolic Panel: Recent Labs  Lab 08/04/20 1537 08/04/20 1606  NA 132*  --   K 4.3  --   CL 99  --   CO2 23  --   GLUCOSE 100*  --   BUN 22  --   CREATININE 1.10*  --   CALCIUM 9.3  --   MG  --  2.0   GFR: Estimated Creatinine Clearance: 38.5 mL/min (A) (by C-G formula based on SCr of 1.1 mg/dL (H)). Liver Function Tests: Recent Labs   Lab 08/04/20 1537  AST 25  ALT 16  ALKPHOS 60  BILITOT 1.0  PROT 7.4  ALBUMIN 4.2   No results for input(s): LIPASE, AMYLASE in the last 168 hours. Recent Labs  Lab 08/05/20 0404  AMMONIA 23   Coagulation Profile: No results for input(s): INR, PROTIME in the last 168 hours. Cardiac Enzymes: No results for input(s): CKTOTAL, CKMB, CKMBINDEX, TROPONINI in the last 168 hours. BNP (last 3 results) No results for input(s): PROBNP in the last 8760 hours. HbA1C: No results for input(s): HGBA1C in the last 72 hours. CBG: Recent Labs  Lab 08/04/20 1608  GLUCAP 96   Lipid Profile: No results for input(s): CHOL, HDL, LDLCALC, TRIG, CHOLHDL, LDLDIRECT in the last 72 hours. Thyroid Function Tests: No results for input(s): TSH, T4TOTAL, FREET4, T3FREE, THYROIDAB in the last 72 hours. Anemia Panel: Recent Labs    08/05/20 0404  VITAMINB12 230   Sepsis Labs: No results for input(s): PROCALCITON, LATICACIDVEN in the last 168 hours.  Recent Results (from the past 240 hour(s))  SARS  CORONAVIRUS 2 (TAT 6-24 HRS) Nasopharyngeal Nasopharyngeal Swab     Status: None   Collection Time: 08/04/20 10:57 PM   Specimen: Nasopharyngeal Swab  Result Value Ref Range Status   SARS Coronavirus 2 NEGATIVE NEGATIVE Final    Comment: (NOTE) SARS-CoV-2 target nucleic acids are NOT DETECTED.  The SARS-CoV-2 RNA is generally detectable in upper and lower respiratory specimens during the acute phase of infection. Negative results do not preclude SARS-CoV-2 infection, do not rule out co-infections with other pathogens, and should not be used as the sole basis for treatment or other patient management decisions. Negative results must be combined with clinical observations, patient history, and epidemiological information. The expected result is Negative.  Fact Sheet for Patients: HairSlick.no  Fact Sheet for Healthcare  Providers: quierodirigir.com  This test is not yet approved or cleared by the [place] FDA and  has been authorized for detection and/or diagnosis of SARS-CoV-2 by FDA under an Emergency Use Authorization (EUA). This EUA will remain  in effect (meaning this test can be used) for the duration of the COVID-19 declaration under Se ction 564(b)(1) of the Act, 21 U.S.C. section 360bbb-3(b)(1), unless the authorization is terminated or revoked sooner.  Performed at [facility], [address] [place], [place] [address]          Radiology Studies: DG Chest 2 View  Result Date: 08/04/2020 CLINICAL DATA:  Onset body aches and confusion today. EXAM: CHEST - 2 VIEW COMPARISON:  Single-view of the chest 03/05/2020. FINDINGS: Two lead pacing device is unchanged. Lungs are clear. Heart size is normal. Aortic atherosclerosis. No pneumothorax or pleural fluid. No acute or focal bony abnormality. IMPRESSION: No acute disease. Aortic Atherosclerosis (ICD10-I70.0). Electronically Signed   By: Drusilla Kanner M.D.   On: 08/04/2020 16:52   CT Head Wo Contrast  Result Date: 08/04/2020 CLINICAL DATA:  Mental status change. EXAM: CT HEAD WITHOUT CONTRAST TECHNIQUE: Contiguous axial images were obtained from the base of the skull through the vertex without intravenous contrast. COMPARISON:  May 02, 2020 FINDINGS: Brain: No subdural, epidural, or subarachnoid hemorrhage. Ventricles and sulci are unremarkable. Mild white matter changes. No acute cortical ischemia or infarct. No mass effect or midline shift. Cerebellum, brainstem, and basal cisterns are normal. Vascular: No hyperdense vessel or unexpected calcification. Skull: Normal. Negative for fracture or focal lesion. Sinuses/Orbits: No acute finding. Other: None. IMPRESSION: No acute intracranial abnormalities are identified. Electronically Signed   By: Gerome Sam III M.D   On: 08/04/2020 16:49         Scheduled Meds: . dicyclomine  10 mg Oral BID  . digoxin  0.0625 mg Oral BID  . diltiazem  120 mg Oral Daily  . enoxaparin (LOVENOX) injection  40 mg Subcutaneous Q24H  . escitalopram  20 mg Oral Daily  . hydroxychloroquine  200 mg Oral BID  . levothyroxine  112 mcg Oral QAC breakfast  . propafenone  225 mg Oral BID  . traZODone  75 mg Oral QHS   Continuous Infusions: . sodium chloride 125 mL/hr at 08/05/20 0921  . cefTRIAXone (ROCEPHIN)  IV       LOS: 0 days    Time spent: 39 minutes spent on chart review, discussion with nursing staff, consultants, updating family and interview/physical exam; more than 50% of that time was spent in counseling and/or coordination of care.    Alvira Philips [place], DO [facility] Available via Epic secure chat 7am-7pm After these hours, please refer to coverage provider  listed on [email or web address] 08/05/2020, 10:24 AM

## 2020-08-06 ENCOUNTER — Encounter: Payer: Self-pay | Admitting: Psychology

## 2020-08-06 ENCOUNTER — Other Ambulatory Visit (HOSPITAL_COMMUNITY): Payer: Self-pay | Admitting: Internal Medicine

## 2020-08-06 LAB — CBC
HCT: 37 % (ref 36.0–46.0)
Hemoglobin: 12.1 g/dL (ref 12.0–15.0)
MCH: 31.1 pg (ref 26.0–34.0)
MCHC: 32.7 g/dL (ref 30.0–36.0)
MCV: 95.1 fL (ref 80.0–100.0)
Platelets: 187 10*3/uL (ref 150–400)
RBC: 3.89 MIL/uL (ref 3.87–5.11)
RDW: 13 % (ref 11.5–15.5)
WBC: 8.9 10*3/uL (ref 4.0–10.5)
nRBC: 0 % (ref 0.0–0.2)

## 2020-08-06 LAB — BASIC METABOLIC PANEL
Anion gap: 10 (ref 5–15)
BUN: 18 mg/dL (ref 8–23)
CO2: 21 mmol/L — ABNORMAL LOW (ref 22–32)
Calcium: 8.6 mg/dL — ABNORMAL LOW (ref 8.9–10.3)
Chloride: 105 mmol/L (ref 98–111)
Creatinine, Ser: 0.78 mg/dL (ref 0.44–1.00)
GFR, Estimated: >60 mL/min (ref >60)
Glucose, Bld: 103 mg/dL — ABNORMAL HIGH (ref 70–99)
Potassium: 3.2 mmol/L — ABNORMAL LOW (ref 3.5–5.1)
Sodium: 136 mmol/L (ref 135–145)

## 2020-08-06 MED ORDER — POTASSIUM CHLORIDE CRYS ER 20 MEQ PO TBCR
40.0000 meq | EXTENDED_RELEASE_TABLET | ORAL | Status: AC
  Administered 2020-08-06 (×2): 40 meq via ORAL
  Filled 2020-08-06 (×2): qty 2

## 2020-08-06 MED ORDER — CEFDINIR 300 MG PO CAPS: 300.0000 mg | ORAL_CAPSULE | Freq: Two times a day (BID) | ORAL | 0 refills | Status: DC

## 2020-08-06 MED ORDER — HYDROCODONE-ACETAMINOPHEN 5-325 MG PO TABS
1.0000 | ORAL_TABLET | Freq: Four times a day (QID) | ORAL | Status: DC | PRN
  Administered 2020-08-06: 1 via ORAL
  Filled 2020-08-06: qty 1

## 2020-08-06 NOTE — Discharge Summary (Signed)
Physician Discharge Summary  [name]:[id number] DOB: 1945-10-09 DOA: 08/04/2020  PCP: Georgianne Fick, MD  Admit date: 08/04/2020 Discharge date: 08/06/2020  Admitted From: Home Disposition: Home  Recommendations for Outpatient Follow-up:  1. Follow up with PCP in 1-2 weeks 2. Continue cefdinir 300 mg p.o. twice daily for UTI 3. Please obtain BMP/CBC in one week 4. Please follow up on the following pending results: Finalized urine culture  Home Health: No, patient currently in outpatient physical therapy Equipment/Devices: None  Discharge Condition: Stable CODE STATUS: Full code Diet recommendation: Heart healthy diet  History of present illness:  Jacqueline Kim is a 75 y.o.femalewith medical history significant ofparoxysmal A. fib not on anticoagulation due to fall risk, sinus node dysfunction/sick sinus syndrome status post PPM, hypertension, hypothyroidism, IBS, GERD, gait disorder and recurrent falls, rheumatoid arthritis, recurrent UTIs, seen by neurology 10 days ago for evaluation of memory loss presenting to the ED for evaluation of confusion and generalized weakness. Patient AAO x1 on arrival to the ED and family reported that she is AAOx4 at baseline.  In the ED, temperature 98.1, HR 62, RR 16, BP 104/62, SPO2 93% on room air.  WBC 12.7, hemoglobin 12.8, platelet count 227K.Sodium 132, potassium 4.3, chloride 99, bicarb 23, BUN 22, creatinine 1.1, glucose 100. Magnesium level normal. Blood ethanol level undetectable. UA with moderate amount of leukocytes, 21-50 WBCs, and many bacteria. Urine culture pending. SARS-CoV-2 PCR test pending. Chest x-ray not suggestive of pneumonia. Head CT negative for acute intracranial abnormality. Patient was given Tylenol, ceftriaxone, and 500 cc LR bolus.  Hospital service was consulted for further evaluation and management of acute metabolic encephalopathy likely secondary to UTI.  Hospital course:  Acute metabolic  encephalopathy, POA Gram-negative rods urinary tract infection Patient presenting to the ED with confusion and generalized weakness.  Patient is afebrile, WBC count slightly elevated 12.7.  Covid-19 PCR negative.  EtOH level less than 10 and ammonia 23 which is within normal limits.  CT head and chest x-ray with no acute findings. Urinalysis with moderate leukoesterase, negative nitrite, many bacteria and 21-50 RBCs. Patient with history of UTI in the past with E. coli/Klebsiella and likely etiology to her underlying confusion.  Patient was started on ceftriaxone 1 g IV every 24 hours.  Patient's mental status improved back to her normal baseline.  Patient wishes to discharge home.  We will continue antibiotics with cefdinir 300 mg p.o. twice daily to complete 5-day course.  Follow-up PCP for finalized urine culture.  Paroxysmal atrial fibrillation: Follows with cardiology outpatient, Dr. [name]. Not on anticoagulation due to fall risk. Not tachycardic at present. Continue diltiazem CD 120 mg p.o. daily, digoxin 0.0625mg  BID, and propafenone 225mg  BID.  Sinus node dysfunction/sick sinus syndrome:Status post PPM.  Generalized weakness: Likely due to UTI.  Patient does receive home health with aides 3 times a week and goes to outpatient physical therapy.  Hypertension: Continue diltiazem CD 120mg  PO daily  Hypothyroidism Continue Synthroid PO daily  IBS Continue Bentyl 10 mg p.o. twice daily  Rheumatoid arthritis Continue Plaquenil  Depression Continue Escitalopram 20mg  PO daily and trazodone 75mg  PO daily  Discharge Diagnoses:  Principal Problem:   UTI (urinary tract infection) Active Problems:   Hypothyroid   HTN (hypertension)   Paroxysmal atrial fibrillation (HCC)   Acute metabolic encephalopathy    Discharge Instructions  Discharge Instructions    Call MD for:  difficulty breathing, headache or visual disturbances   Complete by: As directed  Call MD  for:  extreme fatigue   Complete by: As directed    Call MD for:  persistant dizziness or light-headedness   Complete by: As directed    Call MD for:  persistant nausea and vomiting   Complete by: As directed    Call MD for:  severe uncontrolled pain   Complete by: As directed    Call MD for:  temperature >100.4   Complete by: As directed    Diet - low sodium heart healthy   Complete by: As directed    Increase activity slowly   Complete by: As directed      Allergies as of 08/06/2020      Reactions   Azithromycin Other (See Comments)   Causes heart rhythm issues Other reaction(s): Other (See Comments) Causes heart rhythm issues   Epinephrine Other (See Comments)   Causes A-Fib Other reaction(s): Increased Heart Rate (intolerance) States that it caused her heart to beat fast, so she will not take it. Causes A-Fib A-fib Triggers atrial fib   Penicillins Anaphylaxis   Has patient had a PCN reaction causing immediate rash, facial/tongue/throat swelling, SOB or lightheadedness with hypotension: Yes Has patient had a PCN reaction causing severe rash involving mucus membranes or skin necrosis: No Has patient had a PCN reaction that required hospitalization No Has patient had a PCN reaction occurring within the last 10 years: No If all of the above answers are "NO", then may proceed with Cephalosporin use.   Lidocaine Palpitations   Or any similar medication Other reaction(s): Other (See Comments) Or any similar medication   Metoprolol Other (See Comments)   alopecia Other reaction(s): Other (See Comments) alopecia      Medication List    TAKE these medications   atenolol 50 MG tablet Commonly known as: TENORMIN Take 50 mg by mouth daily as needed (afib).   Cartia XT 120 MG 24 hr capsule Generic drug: diltiazem TAKE ONE CAPSULE BY MOUTH EVERY NIGHT AT BEDTIME What changed:   how much to take  when to take this   cefdinir 300 MG capsule Commonly known as:  OMNICEF Take 1 capsule (300 mg total) by mouth 2 (two) times daily for 4 days.   diclofenac Sodium 1 % Gel Commonly known as: VOLTAREN Apply 2 g topically daily as needed (pain).   dicyclomine 10 MG capsule Commonly known as: BENTYL Take 10 mg by mouth 2 (two) times daily.   digoxin 0.125 MG tablet Commonly known as: LANOXIN Take 0.5 tablets (0.0625 mg total) by mouth daily. What changed: when to take this   escitalopram 20 MG tablet Commonly known as: LEXAPRO Take 20 mg by mouth daily.   HYDROcodone-acetaminophen 5-325 MG tablet Commonly known as: NORCO/VICODIN Take 1 tablet by mouth every 6 (six) hours as needed for moderate pain.   hydrocortisone 2.5 % rectal cream Commonly known as: ANUSOL-HC Place 1 application rectally daily as needed for hemorrhoids or anal itching.   hydroxychloroquine 200 MG tablet Commonly known as: PLAQUENIL Take 200 mg by mouth 2 (two) times daily.   levothyroxine 112 MCG tablet Commonly known as: SYNTHROID Take 112 mcg by mouth daily before breakfast.   propafenone 150 MG tablet Commonly known as: RYTHMOL TAKE ONE AND ONE-HALF TABLETS BY MOUTH EVERY MORNING, ONE TABLET BY MOUTH MID-DAY, AND ONE AND ONE-HALF TABLETS AT BEDTIME What changed: See the new instructions.   traZODone 150 MG tablet Commonly known as: DESYREL Take 75 mg by mouth at bedtime.  Follow-up Information    Georgianne Fick, MD. Schedule an appointment as soon as possible for a visit in 1 week(s).   Specialty: Internal Medicine Contact information: [address] [place] [place] [address] [phone]        [name], MD .   Specialty: Cardiology Contact information: [address] [place] [address] [phone]              Allergies  Allergen Reactions  . Azithromycin Other (See Comments)    Causes heart rhythm issues Other reaction(s): Other (See Comments) Causes heart rhythm issues  . Epinephrine  Other (See Comments)    Causes A-Fib Other reaction(s): Increased Heart Rate (intolerance) States that it caused her heart to beat fast, so she will not take it. Causes A-Fib A-fib Triggers atrial fib   . Penicillins Anaphylaxis    Has patient had a PCN reaction causing immediate rash, facial/tongue/throat swelling, SOB or lightheadedness with hypotension: Yes Has patient had a PCN reaction causing severe rash involving mucus membranes or skin necrosis: No Has patient had a PCN reaction that required hospitalization No Has patient had a PCN reaction occurring within the last 10 years: No If all of the above answers are "NO", then may proceed with Cephalosporin use.   . Lidocaine Palpitations    Or any similar medication Other reaction(s): Other (See Comments) Or any similar medication  . Metoprolol Other (See Comments)    alopecia Other reaction(s): Other (See Comments) alopecia    Consultations:  None   Procedures/Studies: DG Chest 2 View  Result Date: 08/04/2020 CLINICAL DATA:  Onset body aches and confusion today. EXAM: CHEST - 2 VIEW COMPARISON:  Single-view of the chest 03/05/2020. FINDINGS: Two lead pacing device is unchanged. Lungs are clear. Heart size is normal. Aortic atherosclerosis. No pneumothorax or pleural fluid. No acute or focal bony abnormality. IMPRESSION: No acute disease. Aortic Atherosclerosis (ICD10-I70.0). Electronically Signed   By: Drusilla Kanner M.D.   On: 08/04/2020 16:52   CT Head Wo Contrast  Result Date: 08/04/2020 CLINICAL DATA:  Mental status change. EXAM: CT HEAD WITHOUT CONTRAST TECHNIQUE: Contiguous axial images were obtained from the base of the skull through the vertex without intravenous contrast. COMPARISON:  May 02, 2020 FINDINGS: Brain: No subdural, epidural, or subarachnoid hemorrhage. Ventricles and sulci are unremarkable. Mild white matter changes. No acute cortical ischemia or infarct. No mass effect or midline shift.  Cerebellum, brainstem, and basal cisterns are normal. Vascular: No hyperdense vessel or unexpected calcification. Skull: Normal. Negative for fracture or focal lesion. Sinuses/Orbits: No acute finding. Other: None. IMPRESSION: No acute intracranial abnormalities are identified. Electronically Signed   By: Gerome Sam III M.D   On: 08/04/2020 16:49   CUP PACEART REMOTE DEVICE CHECK  Result Date: 08/01/2020 Scheduled remote reviewed. Normal device function. Battery 2.89 V (ERI = 2.81V) - routing to triage to increase follow up. 1 fast A&V and 6 VT-NS events logged 1 to 44 seconds w/ rates 150's to 180's bpm; available EGMs suggests AF w/ RVR and SVT. AF burden 5.9%; longest logged 2 days; available EGMs suggest AF. History PAF per previous notes not OAC due to fall/hematoma. Overall V rates controlled. Next remote 31 days. HB     Subjective: Patient seen and examined at bedside, resting comfortably.  Wishes to discharge home today.  She states "misses her dog".  Updated patient's daughter via telephone.  No other complaints or concerns at this time.  Denies headache, no visual changes,  no fever/chills/night sweats, no nausea/vomiting/diarrhea, no chest pain, no palpitations, no shortness of breath, no abdominal pain, no weakness, no fatigue, no paresthesias.  No acute events overnight per nursing staff.  Discharge Exam: Vitals:   08/05/20 2036 08/06/20 0505  BP: (!) 143/94 (!) 140/94  Pulse: (!) 50 (!) 103  Resp: 16 16  Temp: 97.8 F (36.6 C) 97.8 F (36.6 C)  SpO2: 96% 97%   Vitals:   08/05/20 1120 08/05/20 1321 08/05/20 2036 08/06/20 0505  BP: (!) 136/58 (!) 145/75 (!) 143/94 (!) 140/94  Pulse: 65 70 (!) 50 (!) 103  Resp: Temp: 98.6 F (37 C) 98.5 F (36.9 C) 97.8 F (36.6 C) 97.8 F (36.6 C)  TempSrc: Oral Oral Oral Oral  SpO2: 97% 98% 96% 97%    General: Pt is alert, awake, not in acute distress Cardiovascular: RRR, S1/S2 +, no rubs, no gallops Respiratory:  CTA bilaterally, no wheezing, no rhonchi, oxygenating well on room air Abdominal: Soft, NT, ND, bowel sounds + Extremities: no edema, no cyanosis    The results of significant diagnostics from this hospitalization (including imaging, microbiology, ancillary and laboratory) are listed below for reference.     Microbiology: Recent Results (from the past 240 hour(s))  Urine culture     Status: Abnormal (Preliminary result)   Collection Time: 08/04/20  9:39 PM   Specimen: Urine, Clean Catch  Result Value Ref Range Status   Specimen Description   Final    URINE, CLEAN CATCH Performed at [facility], [address], [place], [place] [address]    Special Requests   Final    NONE Performed at [facility], [address] [place], [place] [address]    Culture (A)  Final    >=100,000 COLONIES/mL GRAM NEGATIVE RODS SUSCEPTIBILITIES TO FOLLOW Performed at [facility], [address], [place], [place] [address]    Report Status PENDING  Incomplete  SARS CORONAVIRUS 2 (TAT 6-24 HRS) Nasopharyngeal Nasopharyngeal Swab     Status: None   Collection Time: 08/04/20 10:57 PM   Specimen: Nasopharyngeal Swab  Result Value Ref Range Status   SARS Coronavirus 2 NEGATIVE NEGATIVE Final    Comment: (NOTE) SARS-CoV-2 target nucleic acids are NOT DETECTED.  The SARS-CoV-2 RNA is generally detectable in upper and lower respiratory specimens during the acute phase of infection. Negative results do not preclude SARS-CoV-2 infection, do not rule out co-infections with other pathogens, and should not be used as the sole basis for treatment or other patient management decisions. Negative results must be combined with clinical observations, patient history, and epidemiological information. The expected result is Negative.  Fact Sheet for Patients: HairSlick.no  Fact Sheet for Healthcare  Providers: quierodirigir.com  This test is not yet approved or cleared by the [place] FDA and  has been authorized for detection and/or diagnosis of SARS-CoV-2 by FDA under an Emergency Use Authorization (EUA). This EUA will remain  in effect (meaning this test can be used) for the duration of the COVID-19 declaration under Se ction 564(b)(1) of the Act, 21 U.S.C. section 360bbb-3(b)(1), unless the authorization is terminated or revoked sooner.  Performed at [facility], [address] [place], [place] [address]      Labs: BNP (last 3 results) No results for input(s): BNP in the last 8760 hours. Basic Metabolic Panel: Recent Labs  Lab 08/04/20 1537 08/04/20 1606 08/06/20 0313  NA 132*  --  136  K 4.3  --  3.2*  CL 99  --  105  CO2 23  --  21*  GLUCOSE 100*  --  103*  BUN 22  --  18  CREATININE 1.10*  --  0.78  CALCIUM 9.3  --  8.6*  MG  --  2.0  --    Liver Function Tests: Recent Labs  Lab 08/04/20 1537  AST 25  ALT 16  ALKPHOS 60  BILITOT 1.0  PROT 7.4  ALBUMIN 4.2   No results for input(s): LIPASE, AMYLASE in the last 168 hours. Recent Labs  Lab 08/05/20 0404  AMMONIA 23   CBC: Recent Labs  Lab 08/04/20 1537 08/05/20 0833 08/06/20 0313  WBC 12.7* 10.3 8.9  HGB 12.8 11.9* 12.1  HCT 39.4 35.6* 37.0  MCV 95.9 94.2 95.1  PLT 227 187 187   Cardiac Enzymes: No results for input(s): CKTOTAL, CKMB, CKMBINDEX, TROPONINI in the last 168 hours. BNP: Invalid input(s): POCBNP CBG: Recent Labs  Lab 08/04/20 1608  GLUCAP 96   D-Dimer No results for input(s): DDIMER in the last 72 hours. Hgb A1c No results for input(s): HGBA1C in the last 72 hours. Lipid Profile No results for input(s): CHOL, HDL, LDLCALC, TRIG, CHOLHDL, LDLDIRECT in the last 72 hours. Thyroid function studies Recent Labs    08/05/20 1859  TSH 1.064   Anemia work up Recent Labs    08/05/20 0404  VITAMINB12 230   Urinalysis     Component Value Date/Time   COLORURINE YELLOW 08/04/2020 2139   APPEARANCEUR CLOUDY (A) 08/04/2020 2139   LABSPEC 1.016 08/04/2020 2139   PHURINE 5.0 08/04/2020 2139   GLUCOSEU NEGATIVE 08/04/2020 2139   HGBUR NEGATIVE 08/04/2020 2139   BILIRUBINUR NEGATIVE 08/04/2020 2139   KETONESUR 5 (A) 08/04/2020 2139   PROTEINUR 100 (A) 08/04/2020 2139   UROBILINOGEN 0.2 04/24/2015 1423   NITRITE NEGATIVE 08/04/2020 2139   LEUKOCYTESUR MODERATE (A) 08/04/2020 2139   Sepsis Labs Invalid input(s): PROCALCITONIN,  WBC,  LACTICIDVEN Microbiology Recent Results (from the past 240 hour(s))  Urine culture     Status: Abnormal (Preliminary result)   Collection Time: 08/04/20  9:39 PM   Specimen: Urine, Clean Catch  Result Value Ref Range Status   Specimen Description   Final    URINE, CLEAN CATCH Performed at [facility], [address], [place], [place] [address]    Special Requests   Final    NONE Performed at [facility], [address] [place], [place] [address]    Culture (A)  Final    >=100,000 COLONIES/mL GRAM NEGATIVE RODS SUSCEPTIBILITIES TO FOLLOW Performed at [facility], [address] [place], [place] [address]    Report Status PENDING  Incomplete  SARS CORONAVIRUS 2 (TAT 6-24 HRS) Nasopharyngeal Nasopharyngeal Swab     Status: None   Collection Time: 08/04/20 10:57 PM   Specimen: Nasopharyngeal Swab  Result Value Ref Range Status   SARS Coronavirus 2 NEGATIVE NEGATIVE Final    Comment: (NOTE) SARS-CoV-2 target nucleic acids are NOT DETECTED.  The SARS-CoV-2 RNA is generally detectable in upper and lower respiratory specimens during the acute phase of infection. Negative results do not preclude SARS-CoV-2 infection, do not rule out co-infections with other pathogens, and should not be used as the sole basis for treatment or other patient management decisions. Negative results must be combined with clinical  observations, patient history, and epidemiological information. The expected result is Negative.  Fact Sheet for  Patients: HairSlick.no  Fact Sheet for Healthcare Providers: quierodirigir.com  This test is not yet approved or cleared by the [place] FDA and  has been authorized for detection and/or diagnosis of SARS-CoV-2 by FDA under an Emergency Use Authorization (EUA). This EUA will remain  in effect (meaning this test can be used) for the duration of the COVID-19 declaration under Se ction 564(b)(1) of the Act, 21 U.S.C. section 360bbb-3(b)(1), unless the authorization is terminated or revoked sooner.  Performed at [facility], [address], [place], [place] [address]      Time coordinating discharge: Over 30 minutes  SIGNED:   Alvira Philips [place], DO  [facility] 08/06/2020, 10:34 AM

## 2020-08-06 NOTE — Discharge Instructions (Signed)
Urinary Tract Infection, Adult A urinary tract infection (UTI) is an infection of any part of the urinary tract. The urinary tract includes:  The kidneys.  The ureters.  The bladder.  The urethra. These organs make, store, and get rid of pee (urine) in the body. What are the causes? This infection is caused by germs (bacteria) in your genital area. These germs grow and cause swelling (inflammation) of your urinary tract. What increases the risk? The following factors may make you more likely to develop this condition:  Using a small, thin tube (catheter) to drain pee.  Not being able to control when you pee or poop (incontinence).  Being female. If you are female, these things can increase the risk: ? Using these methods to prevent pregnancy:  A medicine that kills sperm (spermicide).  A device that blocks sperm (diaphragm). ? Having low levels of a female hormone (estrogen). ? Being pregnant. You are more likely to develop this condition if:  You have genes that add to your risk.  You are sexually active.  You take antibiotic medicines.  You have trouble peeing because of: ? A prostate that is bigger than normal, if you are female. ? A blockage in the part of your body that drains pee from the bladder. ? A kidney stone. ? A nerve condition that affects your bladder. ? Not getting enough to drink. ? Not peeing often enough.  You have other conditions, such as: ? Diabetes. ? A weak disease-fighting system (immune system). ? Sickle cell disease. ? Gout. ? Injury of the spine. What are the signs or symptoms? Symptoms of this condition include:  Needing to pee right away.  Peeing small amounts often.  Pain or burning when peeing.  Blood in the pee.  Pee that smells bad or not like normal.  Trouble peeing.  Pee that is cloudy.  Fluid coming from the vagina, if you are female.  Pain in the belly or lower back. Other symptoms include:  Vomiting.  Not  feeling hungry.  Feeling mixed up (confused). This may be the first symptom in older adults.  Being tired and grouchy (irritable).  A fever.  Watery poop (diarrhea). How is this treated?  Taking antibiotic medicine.  Taking other medicines.  Drinking enough water. In some cases, you may need to see a specialist. Follow these instructions at home: Medicines  Take over-the-counter and prescription medicines only as told by your doctor.  If you were prescribed an antibiotic medicine, take it as told by your doctor. Do not stop taking it even if you start to feel better. General instructions  Make sure you: ? Pee until your bladder is empty. ? Do not hold pee for a long time. ? Empty your bladder after sex. ? Wipe from front to back after peeing or pooping if you are a female. Use each tissue one time when you wipe.  Drink enough fluid to keep your pee pale yellow.  Keep all follow-up visits.   Contact a doctor if:  You do not get better after 1-2 days.  Your symptoms go away and then come back. Get help right away if:  You have very bad back pain.  You have very bad pain in your lower belly.  You have a fever.  You have chills.  You feeling like you will vomit or you vomit. Summary  A urinary tract infection (UTI) is an infection of any part of the urinary tract.  This condition is caused by   germs in your genital area.  There are many risk factors for a UTI.  Treatment includes antibiotic medicines.  Drink enough fluid to keep your pee pale yellow. This information is not intended to replace advice given to you by your health care provider. Make sure you discuss any questions you have with your health care provider. Document Revised: 01/13/2020 Document Reviewed: 01/13/2020 Elsevier Patient Education  2021 [employer]   Confusion Confusion is the inability to think with your usual speed or clarity. Confusion can be caused by many things. People who  are confused often describe their thinking as cloudy or unclear. Confusion can also include feeling disoriented. This means you are unaware of where you are or who you are. You may also not know the date or time. When confused, you may have trouble remembering, paying attention, or making decisions. Some people also act aggressively when they are confused. In some cases, confusion may come on quickly. In other cases, it may develop slowly over time. Confusion may be caused by medical conditions such as:  Infections, such as a urinary tract infection (UTI).  Low levels of oxygen, which can develop from conditions such as long-term lung disorders.  Decrease in brain function due to dementia and other conditions that affect the brain, such as seizures, strokes, brain tumors, or head injuries.  Mental health conditions, like panic attacks, anxiety, depression, and hallucinations. Confusion may also be caused by physical factors such as:  Loss of fluid (dehydration) or an imbalance of salts and minerals in the body (electrolytes).  Lack of certain nutrients like niacin, thiamine, or other B vitamins.  Fever or hypothermia, which is a sudden drop in body temperature.  Low or high blood sugar.  Low or high blood pressure. Other causes include:  Lack of sleep or changes in routine or surroundings, such as when traveling or staying in a hospital.  Using too much alcohol, drugs, or medicine.  Side effects of medicines, or taking medicines that affect other medicines (drug interactions). Follow these instructions at home: Pay attention to your symptoms. Tell your health care provider about any changes or if you develop new symptoms. Follow these instructions to control or treat symptoms. Ask a family member or friend for help if needed. Medicines  Take over-the-counter and prescription medicines only as told by your health care provider.  Ask your health care provider about changing or  stopping any medicines that may be causing your confusion.  Avoid pain medicines or sleep medicines until you have fully recovered.  Use a pillbox or an alarm to help you take the right medicines at the right time.   Lifestyle  Eat a balanced diet that includes fruits and vegetables.  Get enough sleep. For most adults, this is 7-9 hours each night.  Do not drink alcohol.  Do not become isolated. Spend time with other people and make plans for your days.  Do not drive until your health care provider says that it is safe to do so.  Do not use any products that contain nicotine or tobacco, such as cigarettes, e-cigarettes, and chewing tobacco. If you need help quitting, ask your health care provider.  Stop other activities that may increase your chances of getting hurt. These may include some work duties, sports activities, swimming, or bike riding. Ask your health care provider what activities are safe for you.   Tips for caregivers  Find out if the person is confused. Ask the person to state his or her  name, age, and the date. If the person is unsure or answers incorrectly, he or she may be confused and need assistance.  Always introduce yourself, no matter how well the person knows you. Remind the person of his or her location.  Place a calendar and clock near the person who is confused. Keep a regular schedule. Make sure the person has plenty of light during the day and sleep at night.  Talk about current events and plans for the day.  Keep the environment calm, quiet, and peaceful.  Help the person do the things that he or she is unable to do. These include: ? Taking medicines. ? Keeping medical appointments. ? Helping with household duties, including meal preparation. ? Running errands.  Get help if you need it. There are several support groups for caregivers. If the person you are helping needs more support, consider day care, extended-care programs, or a skilled nursing  facility. The person's health care provider may be able to help evaluate these options. General instructions  Monitor yourself for any conditions you may have. These can include: ? Checking your blood glucose levels if you have diabetes. ? Maintaining a healthy weight. ? Monitoring your blood pressure if you have hypertension. ? Monitoring your body temperature if you have a fever.  Keep all follow-up visits. This is important. Contact a health care provider if:  You have new symptoms or your symptoms get worse. Get help right away if you:  Feel that you are not able to care for yourself.  Develop severe headaches, repeated vomiting, seizures, blackouts, or slurred speech.  Have increasing confusion, weakness, numbness, restlessness, or personality changes.  Develop a loss of balance, have marked dizziness, feel uncoordinated, or fall.  Develop severe anxiety, or you have delusions or hallucinations. These symptoms may represent a serious problem that is an emergency. Do not wait to see if the symptoms will go away. Get medical help right away. Call your local emergency services (911 in the [place] Do not drive yourself to the hospital. Summary  Confusion is the inability to think with your usual speed or clarity. People who are confused often describe their thinking as cloudy or unclear.  Confusion can also include having trouble remembering, paying attention, or making decisions.  Confusion may come on quickly or develop slowly over time, depending on the cause. There are many different causes of confusion.  Ask for help from family members or friends if you are unable to take care of yourself. This information is not intended to replace advice given to you by your health care provider. Make sure you discuss any questions you have with your health care provider. Document Revised: 09/27/2019 Document Reviewed: 09/27/2019 Elsevier Patient Education  2021 [employer].

## 2020-08-06 NOTE — TOC Initial Note (Signed)
Transition of Care [facility]) - Initial/Assessment Note    Patient Details  Name: Jacqueline Kim MRN: [id number] Date of Birth: Mar 04, 1946  [facility]) CM/SW Contact:    Ida Rogue, LCSW Phone Number: 08/06/2020, 10:10 AM  Clinical Narrative: Followed up with patient's daughter in response to PT recommendation of HH PT.  Ms Trixie Deis states her mother lives at home alone, has in-home aide 3X week at 4 hours per session, she and her sister switch off days of checking/spending some time at the apartment, they have all need equipment and that they plan on continuing outpatient PT rather than switching to [facility].  Her mother's short term memory is becoming increasingly worse, has been unable to have MRI due to pacemaker, and daughter would like to keep her routine as predictable as possible. No further needs identified.  TOC will continue to follow during the course of hospitalization.                  Expected Discharge Plan: Home/Self Care Barriers to Discharge: No Barriers Identified   Patient Goals and CMS Choice     Choice offered to / list presented to : Adult Children  Expected Discharge Plan and Services Expected Discharge Plan: Home/Self Care       Living arrangements for the past 2 months: Apartment                                      Prior Living Arrangements/Services Living arrangements for the past 2 months: Apartment Lives with:: Self Patient language and need for interpreter reviewed:: Yes        Need for Family Participation in Patient Care: Yes (Comment) Care giver support system in place?: Yes (comment)   Criminal Activity/Legal Involvement Pertinent to Current Situation/Hospitalization: No - Comment as needed  Activities of Daily Living Home Assistive Devices/Equipment: None ADL Screening (condition at time of admission) Patient's cognitive ability adequate to safely complete daily activities?: Yes Is the patient deaf or have difficulty  hearing?: No Does the patient have difficulty seeing, even when wearing glasses/contacts?: No Does the patient have difficulty concentrating, remembering, or making decisions?: Yes Patient able to express need for assistance with ADLs?: Yes Does the patient have difficulty dressing or bathing?: No Independently performs ADLs?: Yes (appropriate for developmental age) Does the patient have difficulty walking or climbing stairs?: No Weakness of Legs: None Weakness of Arms/Hands: None  Permission Sought/Granted Permission sought to share information with : Family Supports Permission granted to share information with : No  Share Information with NAME: Laural Benes (Daughter)   [phone]           Emotional Assessment       Orientation: : Oriented to Self,Oriented to Place Alcohol / Substance Use: Not Applicable Psych Involvement: No (comment)  Admission diagnosis:  Delirium [R41.0] UTI (urinary tract infection) [N39.0] Acute UTI [N39.0] Acute metabolic encephalopathy [G93.41] Patient Active Problem List   Diagnosis Date Noted  . Acute metabolic encephalopathy 08/05/2020  . UTI (urinary tract infection) 08/04/2020  . Obstructive sleep apnea of adult 06/28/2020  . 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 Advanced nonexudative age-related macular degeneration of left eye without subfoveal involvement 06/28/2020  . Hematoma of left thigh 01/21/2019  . Normocytic normochromic anemia 01/21/2019  . Hematoma 01/21/2019  . Facial laceration   . Rectal bleeding 02/01/2016  .  Constipation 02/01/2016  . Dehydration 05/05/2015  . S/P left TKA 05/01/2015  . S/P knee replacement 05/01/2015  . Hypotension due to drugs 02/20/2015  . Cellulitis of left buttock 12/20/2014  . Cellulitis 12/20/2014  . AKI (acute kidney injury) (HCC) 12/20/2014  . Hyponatremia 12/20/2014  . Paroxysmal atrial fibrillation (HCC)  08/01/2014  . Irritable bowel syndrome 06/14/2014  . Fatigue 03/06/2014  . HTN (hypertension) 09/28/2013  . Atrial fibrillation with rapid ventricular response; paroxysmal 09/18/2013  . Chest pain 10/08/2012  . D-dimer, elevated, pt. on pradaxa 09/11/2011  . Chest pain, negative MI, resolved once heart rate slowed, secondary to rapid a. fib 09/11/2011  . Paroxysmal a-fib, recurrent 07/13/12- (on Propafenone)  05/27/2011  . Sinus node dysfunction (HCC) 05/27/2011  . Hypothyroid 05/27/2011  . 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 Anxiety 05/27/2011  . Chronic anticoagulation 05/27/2011   PCP:  Georgianne Fick, MD Pharmacy:   [facility], [place] - [address] [place] [address] Phone: [phone] Fax: [phone]     Social Determinants of Health (SDOH) Interventions    Readmission Risk Interventions No flowsheet data found.

## 2020-08-07 LAB — URINE CULTURE: Culture: >=100000 — AB

## 2020-08-09 ENCOUNTER — Telehealth: Payer: Self-pay | Admitting: Neurology

## 2020-08-09 NOTE — Telephone Encounter (Signed)
I received office notes from Dr. Callie Fielding from office visit on 07/06/2018.  She had EMG and nerve conduction velocity testing of the lower extremities.    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  As noted above, 1 aspect of her symptom complex is constant numbness and tingling in her toes.  I think it is the symptoms that are likely reflective of a polyneuropathy.  Remainder of electrodiagnostic testing today was normal with no evidence for lumbar radiculopathy, mononeuropathy or plexopathy.  As noted above, she feels that the propensity for her legs to buckle it is in large part related to knee pain.  Indeed she does have some tenderness and mild effusion of the right knee and has a history of prior knee replacement on the left that has been an ongoing source of pain.  In addition to the above she also notes that with prolonged standing and walking she will experience some paresthesias and increased weakness diffuse bilaterally which by history sounds suspicious for claudication.  She may be experiencing some symptoms of claudication that is not manifesting on electrodiagnostic testing (i.e. not resulting in axonal loss). >>   FYI, nothing further needed.

## 2020-08-15 ENCOUNTER — Other Ambulatory Visit (HOSPITAL_COMMUNITY): Payer: Self-pay | Admitting: Internal Medicine

## 2020-09-04 ENCOUNTER — Other Ambulatory Visit (HOSPITAL_COMMUNITY): Payer: Self-pay | Admitting: Internal Medicine

## 2020-09-10 ENCOUNTER — Other Ambulatory Visit: Payer: Self-pay

## 2020-09-10 ENCOUNTER — Encounter: Payer: Self-pay | Admitting: Psychology

## 2020-09-10 ENCOUNTER — Encounter: Payer: Medicare Other | Attending: Psychology | Admitting: Psychology

## 2020-09-10 DIAGNOSIS — R413 Other amnesia: Secondary | ICD-10-CM | POA: Diagnosis not present

## 2020-09-10 NOTE — Progress Notes (Signed)
Neuropsychological Consultation   Patient:   Jacqueline Kim   DOB:   1945-11-27  MR Number:  [id number]  Location:  [facility] FOR PAIN AND [facility] MEDICINE [facility] PHYSICAL MEDICINE AND REHABILITATION [address] [place], [id number] [facility] [place] [address] Dept: [phone]           Date of Service:   09/10/2020  Start Time:   1 PM End Time:   3 PM  Today's visit included a 1 hour 15-minute face-to-face clinical interview with the patient as well as her daughter Jacqueline Kim.  Another 45 minutes were spent in records review, report writing and setting up testing parameters.  Provider/Observer:  Arley Phenix, Psy.D.       Clinical Neuropsychologist       Billing Code/Service: 96116/96121  Chief Complaint:    Jacqueline Kim is a 75 year old female referred for neuropsychological evaluation by her treating neurologist [name], MD.  She was initially referred for broader neurological evaluation by the patient's PCP [name], MD due to subjective reports of memory loss.  The patient has a past medical history including arthritis, degenerative spine disease, hypertension, A. fib, hypothyroidism, history of congestive heart failure, reflux disease, depression/anxiety, glaucoma, irritable bowel syndrome, status post pacemaker placement, hyponatremia, gait disorder with recurrent falls.  The patient is described progressive short-term memory deficits and the patient reports some word finding difficulties.  The patient reports that if she cannot remember something she tends to remember it later after period of time has passed.  The patient's daughter reports that these difficulties have been going on for the past 8 to 12 months and she sees them as progressing.  Reason for Service:  Jacqueline Kim is a 75 year old female referred for neuropsychological evaluation by her treating neurologist [name], MD.  She was initially referred for broader  neurological evaluation by the patient's PCP [name], MD due to subjective reports of memory loss.  The patient has a past medical history including arthritis, degenerative spine disease, hypertension, A. fib, hypothyroidism, history of congestive heart failure, reflux disease, depression/anxiety, glaucoma, irritable bowel syndrome, status post pacemaker placement, hyponatremia, gait disorder with recurrent falls.  The patient is described progressive short-term memory deficits and the patient reports some word finding difficulties.  The patient reports that if she cannot remember something she tends to remember it later after period of time has passed.  The patient's daughter reports that these difficulties have been going on for the past 8 to 12 months and she sees them as progressing.  While long-term memory is reported to be quite good there are increasing self-reports of short-term memory issues over the past year and the patient reports that she is getting more forgetful.  The patient quit smoking around 15 years ago and has had a significant reduction in alcohol consumption and currently the patient is reported to have approximately 1 large bottle of wine over a 3-day period of time.  The patient has a long history of balance and gait issues and has had a history of scoliosis and other spinal issues contributing to her gait difficulties.  She does need assistance for getting a shower due to balance therapies.  She has participated in physical therapy.  The patient and her daughter both deny any symptoms related to tremors, visual hallucinations or difficulties with visual-spatial/visual constructional or geographic disorientation type symptoms.  The patient has had recent metabolic issues that have been disturbed and issues with UTIs that have exacerbated  her symptoms.  The patient's daughter Jacqueline Kim reports that more recently when the patient has dealt with a UTI that it is taking the patient  longer to get over the impact it has on physical functioning and cognitive functioning with each infectious event.  The patient had a 3-day stay in a nursing rehabilitation program after a fall in August 2019.  Ongoing medical issues include problems with her back, osteoporosis, RA and A. fib.  There is a family history of some memory difficulties with her mother.  The mother's memory and cognitive issues developed after age 69 and the patient's mother lived to 24 years old.  Maternal grandmother is also reported to have had memory issues later in life.  The patient does not have any family information about her biological father.  The patient has had some issues with sleep and at times particular more recently has been up late at night and then sleeps late into the morning close to noon.  The patient reports that she will watch TV late at night.  Appetite is described as okay and the patient does not cook and most food is ordered.  She is not reported to have lost any weight recently but she does not tend to eat formal meals and eats a lot of snacks including fresh fruits.  Water intake is a concern.  The patient does have regular care from her daughters who live in the area and see her every day.  The patient also has a son but he lives in [place].  The patient has had prior CT scans of her head as well as spine.  The patient is not able to have an MRI due to having a pacemaker.  CT head has shown no evidence of acute intracranial abnormality and mild cerebral atrophy with chronic small vessel ischemic disease.  Behavioral Observation: [name]  presents as a 75 y.o.-year-old Right handed Caucasian Female who appeared her stated age. her dress was Appropriate and she was Well Groomed and her manners were Appropriate to the situation.  her participation was indicative of Appropriate and Redirectable behaviors.  There were physical disabilities noted related to gait issues.  she displayed an  appropriate level of cooperation and motivation.     Interactions:    Active Appropriate and Redirectable  Attention:   abnormal and attention span appeared shorter than expected for age  Memory:   abnormal; remote memory intact, recent memory impaired  Visuo-spatial:  not examined  Speech (Volume):  normal  Speech:   normal; normal  Thought Process:  Coherent and Relevant  Though Content:  WNL; not suicidal and not homicidal  Orientation:   person, place, time/date and situation  Judgment:   Good  Planning:   Fair  Affect:    Anxious  Mood:    Anxious  Insight:   Good  Intelligence:   high  Marital Status/Living: The patient was born and raised in [place] with 2 half-brothers.  She has 1 half-brother that is still alive but the other brother is deceased.  Patient currently lives by herself and has been living alone for the past 4 years but her daughters come and see her every day.  The patient is divorced after being married for 10 years and a prior marriage for 7 years.  She has a 4 and 86 year old daughter as well as a 74 year old son.  Current Employment: The patient is retired.  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  The patient managed a very complex network constructing multiple homes at a time and did quite well with this complex job.  Substance Use:  The patient had been drinking a regular amount of alcohol each day as much is 1 bottle of wine.  She has significantly reduced it recently but continues to have alcohol each day and consumes approximately 1 bottle of wine every 3 days.  Education:   HS Graduate  Medical History:   Past Medical History:  Diagnosis Date  . Anticoagulant long-term use  WITH PRADAXA 05/27/2011  . Anxiety   . Cellulitis of buttock, left 12/20/2014  . Complication of anesthesia    DIFFICULTY WAKING UP  . Depression   . Diverticulosis   . DJD  (degenerative joint disease)   . Esophagitis 09/17/06  . External hemorrhoids   . GERD (gastroesophageal reflux disease)   . Glaucoma   . History of stress test    a. 2008 - normal nuc.  Marland Kitchen Hypertension   . Hypothyroid   . IBS (irritable bowel syndrome)   . Paroxysmal A-fib [facility]) diagnosed 2008   a. On propafenone, Pradaxa.  . 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 Acute metabolic encephalopathy 08/05/2020  . UTI (urinary tract infection) 08/04/2020  . Obstructive sleep apnea of adult 06/28/2020  . 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 Advanced nonexudative age-related macular degeneration of left eye without subfoveal involvement 06/28/2020  . Hematoma of left thigh 01/21/2019  . Normocytic normochromic anemia 01/21/2019  . Hematoma 01/21/2019  . Facial laceration   . Rectal bleeding 02/01/2016  . Constipation 02/01/2016  . Dehydration 05/05/2015  . S/P left TKA 05/01/2015  . S/P knee replacement 05/01/2015  . Hypotension due to drugs 02/20/2015  . Cellulitis of left buttock 12/20/2014  . Cellulitis 12/20/2014  . AKI (acute kidney injury) (HCC) 12/20/2014  . Hyponatremia 12/20/2014  . Paroxysmal atrial fibrillation (HCC) 08/01/2014  . Irritable bowel syndrome 06/14/2014  . Fatigue 03/06/2014  . HTN (hypertension) 09/28/2013  . Atrial fibrillation with rapid ventricular response; paroxysmal 09/18/2013  . Chest pain 10/08/2012  . D-dimer, elevated, pt. on pradaxa 09/11/2011  . Chest pain, negative MI, resolved once heart rate slowed, secondary to rapid a. fib 09/11/2011  . Paroxysmal a-fib, recurrent 07/13/12- (on Propafenone)  05/27/2011  . Sinus node dysfunction (HCC) 05/27/2011  . Hypothyroid 05/27/2011  . Pacemaker [employer]   (REVO) placed10/23/12 05/27/2011  . Anxiety 05/27/2011  . Chronic anticoagulation 05/27/2011    Psychiatric History:  The patient does have a prior psychiatric history including issues with depression and anxiety but these do not appear to be an overwhelming issue that she is not managing.  The patient does take Lexapro each day as well as having a prescription for trazodone at night for sleep.  Family Med/Psych History:  Family History  Problem Relation Age of Onset  . Anesthesia problems Mother   . Colon polyps Mother        beign  . Colon cancer Neg Hx      Impression/DX:  LEZETTE KITTS is a 75 year old female referred for neuropsychological evaluation by her treating neurologist [name], MD.  She was initially referred for broader neurological  evaluation by the patient's PCP [name], MD due to subjective reports of memory loss.  The patient has a past medical history including arthritis, degenerative spine disease, hypertension, A. fib, hypothyroidism, history of congestive heart failure, reflux disease, depression/anxiety, glaucoma, irritable bowel syndrome, status post pacemaker placement, hyponatremia, gait disorder with recurrent falls.  The patient is described progressive short-term memory deficits and the patient reports some word finding difficulties.  The patient reports that if she cannot remember something she tends to remember it later after period of time has passed.  The patient's daughter reports that these difficulties have been going on for the past 8 to 12 months and she sees them as progressing.  Disposition/Plan:  We have set the patient up for formal neuropsychological testing and will start with a primary battery of the Wechsler Adult Intelligence Scale-IV as well as the Wechsler Memory Scale-IV for older adults.  We will also do some expressive language testing and depending on any particular areas identified with this foundational battery we will add  measures if needed for diagnostic and recommendation purposes.  Diagnosis:    Memory loss         Electronically Signed   _______________________ Arley Phenix, Psy.D. Clinical Neuropsychologist

## 2020-09-12 ENCOUNTER — Other Ambulatory Visit (HOSPITAL_COMMUNITY): Payer: Self-pay | Admitting: Internal Medicine

## 2020-09-19 ENCOUNTER — Other Ambulatory Visit (HOSPITAL_COMMUNITY): Payer: Self-pay

## 2020-09-19 MED FILL — Digoxin Tab 125 MCG (0.125 MG): ORAL | 60 days supply | Qty: 30 | Fill #0 | Status: AC

## 2020-09-19 MED FILL — Digoxin Tab 125 MCG (0.125 MG): ORAL | 30 days supply | Qty: 30 | Fill #0 | Status: CN

## 2020-09-26 ENCOUNTER — Other Ambulatory Visit (HOSPITAL_COMMUNITY): Payer: Self-pay

## 2020-09-27 ENCOUNTER — Other Ambulatory Visit (HOSPITAL_COMMUNITY): Payer: Self-pay

## 2020-09-27 MED ORDER — CIPROFLOXACIN HCL 250 MG PO TABS
ORAL_TABLET | ORAL | 0 refills | Status: DC
  Filled 2020-09-27: qty 14, 7d supply, fill #0

## 2020-10-08 ENCOUNTER — Other Ambulatory Visit (HOSPITAL_COMMUNITY): Payer: Self-pay

## 2020-10-08 ENCOUNTER — Other Ambulatory Visit: Payer: Self-pay

## 2020-10-08 ENCOUNTER — Ambulatory Visit (INDEPENDENT_AMBULATORY_CARE_PROVIDER_SITE_OTHER): Payer: Medicare Other | Admitting: Cardiovascular Disease

## 2020-10-08 ENCOUNTER — Encounter: Payer: Self-pay | Admitting: Cardiovascular Disease

## 2020-10-08 VITALS — BP 116/63 | HR 63 | Ht 64.0 in | Wt 129.0 lb

## 2020-10-08 DIAGNOSIS — I480 Paroxysmal atrial fibrillation: Secondary | ICD-10-CM

## 2020-10-08 DIAGNOSIS — Z950 Presence of cardiac pacemaker: Secondary | ICD-10-CM

## 2020-10-08 DIAGNOSIS — I495 Sick sinus syndrome: Secondary | ICD-10-CM

## 2020-10-08 DIAGNOSIS — I10 Essential (primary) hypertension: Secondary | ICD-10-CM | POA: Diagnosis not present

## 2020-10-08 DIAGNOSIS — Z79899 Other long term (current) drug therapy: Secondary | ICD-10-CM

## 2020-10-08 MED ORDER — HYDROCODONE-ACETAMINOPHEN 5-325 MG PO TABS
ORAL_TABLET | ORAL | 0 refills | Status: DC
Start: 2020-10-08 — End: 2020-11-13
  Filled 2020-10-10: qty 120, 30d supply, fill #0

## 2020-10-08 NOTE — Patient Instructions (Signed)

## 2020-10-08 NOTE — Progress Notes (Signed)
Cardiology Office Note    Date:  10/10/2020   ID:  Jacqueline Kim, DOB 1945-08-25, MRN [id number]  PCP:  Georgianne Fick, MD  Cardiologist:  Nicki Guadalajara, MD; Thurmon Fair, MD   Chief Complaint  Patient presents with  . Pacemaker Check  . Atrial Fibrillation    History of Present Illness:  Jacqueline Kim is a 75 y.o. female with paroxysmal atrial fibrillation and tachy-brady sd., here for pacemaker check.  She is accompanied by her daughter.  She last saw Dr. [name] in November 2021, when she continued to have problems with orthostatic hypotension.  She has had numerous falls and is no longer on anticoagulation.  She was hospitalized for 3 days in February with a urinary tract infection complicated by encephalopathy.  She has not been troubled by palpitations and she denies syncope, orthopnea, PND, chest pain at rest or with activity, new focal neurological events, lower extremity edema.  She does have occasional orthostatic dizziness.  She has gained back some weight optimal BMI range at about 22.  She is on propafenone as an antiarrhythmic.  She has a prescription for atenolol "as needed" for palpitations.  She has not used any in "months".  She does also receive diltiazem and digoxin for ventricular rate control.  Pacemaker interrogation shows normal device function.  Her Medtronic Revo dual-chamber device was implanted in 2012.  Current generator voltage is 2.88 V (RRT at 2.81 V).  She has 82% atrial pacing and only 0.3% ventricular pacing.  Her burden of atrial fibrillation has been fairly constant at about 4.6%.  Ventricular rate control is borderline satisfactory, with rates mostly around 100 bpm.  She has much faster rate during brief periods of atrial flutter with 2: 1 AV conduction and ventricular rates around 160 bpm.  These rarely last for more than a few minutes.  There was a noticeable increase in the burden of atrial arrhythmia during her hospitalization for urinary  tract infection.  Her activity remains fair at about 1.4 hours per day.  Past Medical History:  Diagnosis Date  . Anticoagulant long-term use  WITH PRADAXA 05/27/2011  . Anxiety   . Cellulitis of buttock, left 12/20/2014  . Complication of anesthesia    DIFFICULTY WAKING UP  . Depression   . Diverticulosis   . DJD (degenerative joint disease)   . Esophagitis 09/17/06  . External hemorrhoids   . GERD (gastroesophageal reflux disease)   . Glaucoma   . History of stress test    a. 2008 - normal nuc.  Marland Kitchen Hypertension   . Hypothyroid   . IBS (irritable bowel syndrome)   . Paroxysmal A-fib [facility]) diagnosed 2008   a. On propafenone, Pradaxa.  . 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 BREAST REDUCTION SURGERY    . BUNIONECTOMY Right 07/09/2015   with rods and pins  . CARDIOVERSION N/A 08/02/2014   Procedure: CARDIOVERSION;  Surgeon: Quintella Reichert, MD;  Location: [facility];  Service: Cardiovascular;  Laterality: N/A;  . CATARACT EXTRACTION, BILATERAL Bilateral   . FACIAL COSMETIC SURGERY    . FOOT SURGERY Left   . HAND SURGERY     multiple  . LIPOSUCTION    . NM MYOCAR PERF WALL MOTION  12/29/2006   No significant ischemia; EF 69%  . OVARIAN CYST REMOVAL    .  PACEMAKER INSERTION  04/08/11   MDT Revo implanted by Dr Rubie Maid  . TONSILLECTOMY    . TOTAL KNEE ARTHROPLASTY Left 05/01/2015   Procedure: TOTAL LEFT KNEE ARTHROPLASTY;  Surgeon: Durene Romans, MD;  Location: [facility] ORS;  Service: Orthopedics;  Laterality: Left;  Marland Kitchen VAGINAL HYSTERECTOMY      Current Medications: Outpatient Medications Prior to Visit  Medication Sig Dispense Refill  . atenolol (TENORMIN) 50 MG tablet Take 50 mg by mouth daily as needed (afib).    . diclofenac Sodium (VOLTAREN) 1 % GEL Apply 2 g topically daily as needed (pain).    Marland Kitchen dicyclomine  (BENTYL) 10 MG capsule Take 10 mg by mouth 2 (two) times daily.    . digoxin (LANOXIN) 0.125 MG tablet TAKE 1/2 TABLET (0.0625 MG TOTAL) BY MOUTH DAILY. (Patient taking differently: Take 0.0625 mg by mouth 2 (two) times daily.) 30 tablet 2  . diltiazem (CARDIZEM CD) 120 MG 24 hr capsule TAKE ONE CAPSULE BY MOUTH EVERY NIGHT AT BEDTIME (Patient taking differently: Take 120 mg by mouth daily.) 90 capsule 3  . escitalopram (LEXAPRO) 20 MG tablet Take 20 mg by mouth daily.    Marland Kitchen estradiol (ESTRACE) 0.5 MG tablet TAKE 1 TABLET BY MOUTH ONCE A DAY. 30 tablet 5  . HYDROcodone-acetaminophen (NORCO/VICODIN) 5-325 MG tablet TAKE 1 TABLET BY MOUTH EVERY 6 HOURS AS NEEDED 120 tablet 0  . hydrocortisone (ANUSOL-HC) 2.5 % rectal cream Place 1 application rectally daily as needed for hemorrhoids or anal itching.    . hydroxychloroquine (PLAQUENIL) 200 MG tablet TAKE 1 AND 1/2 TABLETS BY MOUTH WITH FOOD OR MILK ONCE A DAY 90 DAYS 135 tablet 3  . levothyroxine (SYNTHROID, LEVOTHROID) 112 MCG tablet Take 112 mcg by mouth daily before breakfast.     . traZODone (DESYREL) 150 MG tablet TAKE 1/2 TABLET BY MOUTH ONCE A DAY IN THE EVENING 30 tablet 3  . cefdinir (OMNICEF) 300 MG capsule TAKE 1 CAPSULE (300 MG TOTAL) BY MOUTH 2 (TWO) TIMES DAILY FOR 4 DAYS. 8 capsule 0  . ciprofloxacin (CIPRO) 250 MG tablet TAKE 1 TABLET BY MOUTH EVERY 12 HOURS FOR 7 DAYS (Patient taking differently: Take by mouth every 12 (twelve) hours. for 7 days) 14 tablet 0  . ciprofloxacin (CIPRO) 250 MG tablet Take 1 tablet by mouth every 12 hours for 7 days 14 tablet 0  . clonazePAM (KLONOPIN) 0.5 MG tablet TAKE 1 TABLET BY MOUTH 2 TIMES DAILY FOR ANXIETY 60 tablet 2  . dicyclomine (BENTYL) 10 MG capsule TAKE 1 CAPSULE BY MOUTH THREE TIMES A DAY AS NEEDED 90 270 capsule 1  . escitalopram (LEXAPRO) 20 MG tablet TAKE ONE (1) TABLET BY MOUTH EVERY DAY 90 90 tablet 2  . HYDROcodone-acetaminophen (NORCO/VICODIN) 5-325 MG tablet Take 1 tablet by mouth  every 6 (six) hours as needed for moderate pain.    Marland Kitchen HYDROcodone-acetaminophen (NORCO/VICODIN) 5-325 MG tablet TAKE 1 TABLET AS NEEDED BY MOUTH EVERY 6 HOURS 120 tablet 0  . HYDROcodone-acetaminophen (NORCO/VICODIN) 5-325 MG tablet TAKE 1 TABLET BY MOUTH AS NEEDED EVERY 6 HOURS 30 DAYS 120 tablet 0  . HYDROcodone-acetaminophen (NORCO/VICODIN) 5-325 MG tablet TAKE 1 TABLET BY MOUTH AS NEEDED EVERY 6 HOURS 120 tablet 0  . HYDROcodone-acetaminophen (NORCO/VICODIN) 5-325 MG tablet TAKE 1 TABLET BY MOUTH AS NEEDED EVERY 6 HOURS 120 tablet 0  . HYDROcodone-acetaminophen (NORCO/VICODIN) 5-325 MG tablet TAKE 1 TABLET BY MOUTH AS NEEDED EVERY 6 HOURS 30 DAYS 120 tablet 0  . hydroxychloroquine (PLAQUENIL) 200 MG  tablet Take 200 mg by mouth 2 (two) times daily.    Marland Kitchen levothyroxine (SYNTHROID) 112 MCG tablet TAKE ONE (1) TABLET BY MOUTH EVERY DAY 90 90 tablet 1  . sulfamethoxazole-trimethoprim (BACTRIM DS) 800-160 MG tablet TAKE 1 TABLET BY MOUTH 2 (TWO) TIMES DAILY FOR 3 DAYS. 6 tablet 0  . traZODone (DESYREL) 150 MG tablet Take 75 mg by mouth at bedtime.    . propafenone (RYTHMOL) 150 MG tablet TAKE ONE AND ONE-HALF TABLETS BY MOUTH EVERY MORNING, ONE TABLET BY MOUTH MID-DAY, AND ONE AND ONE-HALF TABLETS AT BEDTIME (Patient taking differently: Take 225 mg by mouth 2 (two) times daily.) 360 tablet 3  . levothyroxine (SYNTHROID) 112 MCG tablet TAKE ONE (1) TABLET BY MOUTH EVERY DAY 90 180 tablet 1   No facility-administered medications prior to visit.     Allergies:   Azithromycin, Epinephrine, Penicillins, Lidocaine, and Metoprolol   Social History   Socioeconomic History  . Marital status: Divorced    Spouse name: Not on file  . Number of children: 3  . Years of education: Not on file  . Highest education level: Not on file  Occupational History  . Occupation: retired    Associate Professor: RETIRED  Tobacco Use  . Smoking status: Former Smoker    Quit date: 08/30/1996    Years since quitting: 24.1  .  Smokeless tobacco: Never Used  Substance and Sexual Activity  . Alcohol use: Yes    Alcohol/week: 6.0 standard drinks    Types: 6 Glasses of wine per week    Comment: daily  . Drug use: No  . Sexual activity: Yes  Other Topics Concern  . Not on file  Social History Narrative   Lives in [place],  Former [employer],  Separated.     Social Determinants of Health   Financial Resource Strain: Not on file  Food Insecurity: Not on file  Transportation Needs: Not on file  Physical Activity: Not on file  Stress: Not on file  Social Connections: Not on file     Family History:  The patient's family history includes Anesthesia problems in her mother; Colon polyps in her mother.   ROS:   Please see the history of present illness.    All other systems are reviewed and are negative.   PHYSICAL EXAM:   VS:  BP 116/63   Pulse 63   Ht 5\' 4"  (1.626 m)   Wt 129 lb (58.5 kg)   SpO2 96%   BMI 22.14 kg/m      General: Alert, oriented x3, no distress, healthy left subclavian pacemaker site Head: no evidence of trauma, PERRL, EOMI, no exophtalmos or lid lag, no myxedema, no xanthelasma; normal ears, nose and oropharynx Neck: normal jugular venous pulsations and no hepatojugular reflux; brisk carotid pulses without delay and no carotid bruits Chest: clear to auscultation, no signs of consolidation by percussion or palpation, normal fremitus, symmetrical and full respiratory excursions Cardiovascular: normal position and quality of the apical impulse, regular rhythm, normal first and second heart sounds, no murmurs, rubs or gallops Abdomen: no tenderness or distention, no masses by palpation, no abnormal pulsatility or arterial bruits, normal bowel sounds, no hepatosplenomegaly Extremities: no clubbing, cyanosis or edema; 2+ radial, ulnar and brachial pulses bilaterally; 2+ right femoral, posterior tibial and dorsalis pedis pulses; 2+ left femoral, posterior tibial and dorsalis  pedis pulses; no subclavian or femoral bruits Neurological: grossly nonfocal Psych: Normal mood and affect   Wt Readings from Last 3 Encounters:  10/08/20 129 lb (58.5 kg)  07/26/20 133 lb (60.3 kg)  05/15/20 125 lb 3.2 oz (56.8 kg)      Studies/Labs Reviewed:   EKG:  EKG is ordered today.  It shows sinus rhythm with a long first-degree AV block at about 244 ms voltage criteria for LVH poor R wave progression V1 to V3, specific mild ST segment elevation in leads I, aVL, V1 V2.  No frank ischemic changes.  QTc 388 ms.    Recent Labs: 08/04/2020: ALT 16; Magnesium 2.0 08/05/2020: TSH 1.064 08/06/2020: BUN 18; Creatinine, Ser 0.78; Hemoglobin 12.1; Platelets 187; Potassium 3.2; Sodium 136   Lipid Panel    Component Value Date/Time   CHOL  12/19/2006 0315    172        ATP III CLASSIFICATION:  <200     mg/dL   Desirable  161-096  mg/dL   Borderline High  >=045    mg/dL   High   TRIG 409 (H) 81/19/1478 0315   HDL 37 (L) 12/19/2006 0315   CHOLHDL 4.6 12/19/2006 0315   VLDL 59 (H) 12/19/2006 0315   LDLCALC  12/19/2006 0315    76        Total Cholesterol/HDL:CHD Risk Coronary Heart Disease Risk Table                     Men   Women  1/2 Average Risk   3.4   3.3    Additional studies/ records that were reviewed today include:  Notes from recent hospitalization.  ASSESSMENT:    1. Paroxysmal atrial fibrillation (HCC)   2. SSS (sick sinus syndrome) (HCC)   3. Pacemaker   4. Essential hypertension   5. Long term current use of antiarrhythmic drug      PLAN:  In order of problems listed above:  1. AFib: The overall burden of atrial fibrillation has been higher over the last year or so, but is relatively stable at about 5%.  Rate control is fair and we are avoiding additional AV nodal blocking agents due to her problems with orthostatic there was an increase in her atrial fibrillation burden during the recent episode of urinary infection.CHADSVasc 4 (age 1, gender, HTN)  without history of stroke or TIA.  Anticoagulation has been discontinued due to her gait instability and frequent falls which have caused serious injuries. 2. SSS: She has over 80% atrial paced rhythm, with good heart rate histogram distribution with her current sensor settings 3. PPM: Normal device function.  Remote downloads every 3 months.  Anticipate she might reach time for generator change out in the next year or 2. 4. HTN: Good control.  Watch for orthostatic hypotension.  Stay well-hydrated.  Avoid diuretics. 5. Antiarrhythmic therapy monitoring: QTc interval is in a safe range, although she takes both propafenone and hydroxychloroquine.  Avoid additional QT prolonging medications.  Should have an ECG about every 6 months.  Currently alternating cardiology office visits between Dr. [name] and her pacemaker checks with me, every 6 months.  Medication Adjustments/Labs and Tests Ordered: Current medicines are reviewed at length with the patient today.  Concerns regarding medicines are outlined above.  Medication changes, Labs and Tests ordered today are listed in the Patient Instructions below. Patient Instructions  Medication Instructions:  No changes *If you need a refill on your cardiac medications before your next appointment, please call your pharmacy*   Lab Work: None ordered If you have labs (blood work) drawn today and your tests  are completely normal, you will receive your results only by: Marland Kitchen MyChart Message (if you have MyChart) OR . A paper copy in the mail If you have any lab test that is abnormal or we need to change your treatment, we will call you to review the results.   Testing/Procedures: None ordered   Follow-Up: At [facility], you and your health needs are our priority.  As part of our continuing mission to provide you with exceptional heart care, we have created designated Provider Care Teams.  These Care Teams include your primary Cardiologist (physician) and  Advanced Practice Providers (APPs -  Physician Assistants and Nurse Practitioners) who all work together to provide you with the care you need, when you need it.  We recommend signing up for the patient portal called "MyChart".  Sign up information is provided on this After Visit Summary.  MyChart is used to connect with patients for Virtual Visits (Telemedicine).  Patients are able to view lab/test results, encounter notes, upcoming appointments, etc.  Non-urgent messages can be sent to your provider as well.   To learn more about what you can do with MyChart, go to [email or web address].    Your next appointment:   12 month(s)  The format for your next appointment:   In Person  Provider:   Thurmon Fair, MD       Signed, Thurmon Fair, MD  10/10/2020 9:19 PM    [facility] [address] [place], [place], [place]  [address] Phone: [phone]; Fax: [phone]

## 2020-10-10 ENCOUNTER — Other Ambulatory Visit (HOSPITAL_COMMUNITY): Payer: Self-pay

## 2020-10-10 ENCOUNTER — Encounter: Payer: Self-pay | Admitting: Cardiovascular Disease

## 2020-10-29 ENCOUNTER — Other Ambulatory Visit (HOSPITAL_COMMUNITY): Payer: Self-pay

## 2020-10-29 MED ORDER — LEVOTHYROXINE SODIUM 112 MCG PO TABS
112.0000 ug | ORAL_TABLET | Freq: Every day | ORAL | 1 refills | Status: DC
  Filled 2020-10-29: qty 90, 90d supply, fill #0
  Filled 2021-01-28: qty 90, 90d supply, fill #1

## 2020-10-29 MED FILL — Diltiazem HCl Coated Beads Cap ER 24HR 120 MG: ORAL | 90 days supply | Qty: 90 | Fill #0 | Status: AC

## 2020-11-05 ENCOUNTER — Encounter: Payer: Medicare Other | Attending: Psychology | Admitting: Psychology

## 2020-11-05 ENCOUNTER — Other Ambulatory Visit: Payer: Self-pay

## 2020-11-05 ENCOUNTER — Encounter: Payer: Self-pay | Admitting: Psychology

## 2020-11-05 DIAGNOSIS — R413 Other amnesia: Secondary | ICD-10-CM | POA: Diagnosis present

## 2020-11-05 NOTE — Progress Notes (Signed)
   Neuropsychology Note  TONYETTA BERKO completed 120 minutes of neuropsychological testing with this provider. The patient did not appear overtly distressed by the testing session, per behavioral observation or via self-report but did become fatigued towards the end. She also complained of discomfort due to pain. Rest breaks were offered.  Patient requested that she stop around halfway through the appointment and return another day, to which I agreed.   Clinical Decision Making: In considering the patient's current level of functioning, level of presumed impairment, nature of symptoms, emotional and behavioral responses during the interview, level of literacy, and observed level of motivation/effort, a battery of tests was selected by Dr. Kieth Brightly during initial consultation on 09/10/20 for patient to complete. Changes were made as deemed necessary based on patient performance on testing, my observations, and additional pertinent factors such as those listed above.  Tests Administered:  . Wechsler Adult Intelligence Scale, 4th Edition (WAIS-IV)  Plan:  Patient will return for another 120 minute testing session to complete the Wechsler Memory Scale, 4th Edition (WMS-IV) Older Adult Battery    Results: Will be included in final report   Feedback to Patient: MYSTIQUE BJELLAND will return on 01/21/21  for an interactive feedback session with this provider at which time her test performances, clinical impressions and treatment recommendations will be reviewed in detail. The patient understands she can contact our office should she require our assistance before this time.  Full report to follow.

## 2020-11-08 ENCOUNTER — Other Ambulatory Visit: Payer: Self-pay | Admitting: Cardiovascular Disease

## 2020-11-12 ENCOUNTER — Other Ambulatory Visit: Payer: Self-pay | Admitting: Cardiovascular Disease

## 2020-11-13 ENCOUNTER — Other Ambulatory Visit (HOSPITAL_COMMUNITY): Payer: Self-pay

## 2020-11-13 MED ORDER — HYDROCODONE-ACETAMINOPHEN 5-325 MG PO TABS
ORAL_TABLET | ORAL | 0 refills | Status: DC
Start: 2020-11-13 — End: 2020-12-10
  Filled 2020-11-13: qty 120, 30d supply, fill #0

## 2020-11-13 MED ORDER — PROPAFENONE HCL 150 MG PO TABS
ORAL_TABLET | ORAL | 0 refills | Status: DC
  Filled 2020-11-13: qty 360, 90d supply, fill #0

## 2020-11-13 MED ORDER — DIGOXIN 125 MCG PO TABS
ORAL_TABLET | ORAL | 1 refills | Status: DC
  Filled 2020-11-13 – 2021-02-12 (×2): qty 45, 90d supply, fill #0

## 2020-11-19 ENCOUNTER — Ambulatory Visit (INDEPENDENT_AMBULATORY_CARE_PROVIDER_SITE_OTHER): Payer: Medicare Other | Admitting: Cardiovascular Disease

## 2020-11-19 ENCOUNTER — Other Ambulatory Visit: Payer: Self-pay

## 2020-11-19 ENCOUNTER — Encounter: Payer: Self-pay | Admitting: Cardiovascular Disease

## 2020-11-19 DIAGNOSIS — I495 Sick sinus syndrome: Secondary | ICD-10-CM

## 2020-11-19 DIAGNOSIS — M069 Rheumatoid arthritis, unspecified: Secondary | ICD-10-CM

## 2020-11-19 DIAGNOSIS — Z79899 Other long term (current) drug therapy: Secondary | ICD-10-CM

## 2020-11-19 DIAGNOSIS — Z950 Presence of cardiac pacemaker: Secondary | ICD-10-CM

## 2020-11-19 DIAGNOSIS — I480 Paroxysmal atrial fibrillation: Secondary | ICD-10-CM

## 2020-11-19 NOTE — Progress Notes (Signed)
Patient ID: Jacqueline Kim, female   DOB: [birth year], 75 y.o.   MRN: [id number]     HPI: Jacqueline Kim is a 75 y.o. female  who presents for a 65monthfollow-up cardiologic evaluation.  Mrs. GGarmanyhas known history of sinus node dysfunction. She has history of paroxysmal atrial fibrillation. She is status post permanent pacemaker implanted on 04/08/2011 Medtronic Revo MRI compatible pacemaker. She also has a history of hypothyroidism. She has had recurrent episodes of paroxysmal atrial fibrillation and has been on pradaxa anticoagulation for several years after initially having been on warfarin.  She has been maintained on rhythmol SR 325 mg twice a day and atenolol 50 mg daily.  She also is on losartan 50 mg for hypertension.  She was hospitalized in January 2014 with recurrent AF in the setting of significant increased family stress. She is now separated and living in a different household from her husband Dr. BSuzie Kim  She has had several episodes of recurrent AF and had an episode on 03/12/2013 which lasted for approximately 12 hours.  On the Saturday before Easter 2015 when she was rushing with family members and trying to prepare dinner she was hospitalized and presented with a heart rate at 150.  She was started on IV Cardizem for rate control.  She spontaneously cardioverted to sinus rhythm on April 6 prior to a planned TEE cardioversion.  I saw her in followup of that hospitalization and suggested an EP evaluation.  She saw Dr. ARayann Hemanfor discussion concerning atrial fibrillation ablation.  Apparently, at that time, the patient was feeling well. She opted against pursuing AF ablation presently.    A followup echo Doppler study on 10/24/2013 showed an ejection fraction of 60-65% and there was evidence for grade 1 diastolic function without wall motion abnormalities.  Her left atrial dimension was normal.    She is followed by Dr. CSallyanne Kusterfor her pacemaker.  She was hospitalized in early  February 2016 following a prolonged episode of atrial fibrillation with rapid ventricular response for which she was symptomatic.  She underwent her initial cardioversion on 07/27/2014.  When she saw Dr. CSallyanne Kusteron August 08, 2014 interrogation of her pacemaker showed a 28 hour episode of atrial fibrillation without recurrence since the cardioversion.  She again developed a recurrent episode of atrial fibrillation on 08/30/2014 which led to an emergency room evaluation.  I last saw her in March.  I recommended follow-up evaluation with Dr. ARayann Hemanand reconsideration of possible atrial fibrillation ablation.  She saw Dr. ARayann Hemanat that time admitted that she was drinking wine on a daily basis.  Rather than pursue ablation she elected to reduce her EtOH intake.  She feels that she has done well and has felt one episode of palpitations during her period of increased stress that she is aware of since her last evaluation..  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  She denies exertional chest pain, shortness of breath, orthopnea, PND, presyncope or syncope or any neurologic sequelae.  When she met with Dr. ARayann Heman  There was no discussion of changing her anti-rhythmic therapy.    She underwent left knee surgery by Dr. CTheda Sers  She also fell on December 20, 2014 in the shower and was hospitalized for 2 days.  She developed a cellulitis and buttock abscess, which required anabiotic therapy.    [facility]  [facility]  She denies any episodes of chest  pain.  At times she notes some shortness of breath with activity.  She saw [name] in October 2017 and her pacemaker was functioning normally.  She did not have any recurrent atrial fibrillation and had only 2 brief episodes of PAT which combined last a total of 54 seconds.  She states she had stopped taking the losartan, which had been 50 mg because that time, she noted some lightheadedness unassociated with any  rhythm disturbance.  In August 2017, a CT of her abdomen was performed which did not explain some of her issues at the time of rectal bleeding and constipation.  She was found to have mild colonic diverticulosis without diverticulitis.  Incidentally, she was noted to have abdominal aortic atherosclerosis.    When I saw her in 2018, she was hypertensive and I recommended reinstitution of losartan at least 25 mg depending upon her blood pressure, but if necessary to increase to 25 mg 3 times a day.  Since I last saw her in May 2018, she underwent an echo Doppler study which showed an EF of 55-60%.  She had normal diastolic parameters.  There was mild mitral regurgitation, mild left atrial dilation, and mild pulmonary hypertension with a PA peak pressure 38 mm.  She saw [name] on 11/13/2016 and her losartan dose was reduced to 25 mg daily.  She sees Dr. Naida Sleight for rheumatoid arthritis.  She is status post left knee replacement and tolerated this well.  Her primary physician is [name].    She has had issues with scoliosis and in the future may require possible surgery.  At times she has noticed some dragging of her left leg.  She is unaware of any recurrent episodes of atrial fibrillation.  Her last echo Doppler study in May 2018 showed an EF of 55-60%.  She had mild mitral regurgitation.  There was mild pulmonary hypertension with a PA pressure 38.  She had normal diastolic parameters.   She saw [name] on February 12, 2018.  At that time, interrogation of her pacemaker revealed one 3-minute episode in the last 6 months of recurrent PAF occurred on Oct 28, 2017.  However, she comes to the office today and states that last week sleeping she was awakened with a chest fluttering and was concerned that she was back in atrial fibrillation for questionable duration.   I  saw her in October 2019 and at that time she was she  there with her daughter.  She appeared much more frail.  Due to chronic back  discomfort, she was walking with assistance.  She had moved to a lower ground apartment from her second floor unit at [facility]. She  had issues with dizziness and has fallen on a couple occasions.  She was on atenolol 25 mg twice a day, propofenone150 mg tablets  1-1/2 in the morning, 1 at midday, 1-1/2 in the evening.  She was on Cardizem CD 120 mg at bedtime.  She was on Eliquis for anticoagulation.  According to the family members she was noted to have significant snoring during sleep and was often waking up in the middle the night with possible gasping for air.  She denies any recent bruises particularly following her fall.  She is on levothyroxine for hypothyroidism.  During that evaluation, she was orthostatic.  She was 100% atrially placed.  I reduced her atenolol down to 12.5 mg.  I also recommended that she reduce her Lexapro and recommended 20 to 30 mm compression stockings.  Due to continued episodes her atenolol was ultimately discontinued altogether.  I scheduled her for a sleep study to assess for sleep apnea.  Her sleep study was done on May 08, 2018 and was positive for sleep apnea with an overall AHI at 13.4/h.  Events were worse and sleep apnea was moderate during REM sleep with an AHI of 22.5/h.  She had significant oxygen desaturation to 80%.    When I  saw her in the office on June 28, 2018 she had not yet had her CPAP titration. However, during that evaluation she appeared exceedingly frail. She had significant orthostatic hypotension and I significantly reduced her medications. I had reduced her trazodone initially from 150 mg down to 75 mg and then subsequently decrease this to 37.5 mg. In addition I completely discontinued her furosemide. She again is now wearing support stockings and she denies any orthostatic symptoms. She underwent her CPAP titration trial on June 30, 2018 and CPAP was titrated up to 14 cm of water with still events. It was recommended that she  initiate CPAP auto therapy with a minimum of 14 up to 20 cm water pressure if needed.  I evaluated her by a telemedicine visit in April 2020.  Her CPAP set up date was August 10, 2018 with [facility]. I was able to obtain a download of her use. She apparently only initially used it for short-term and of the first month only 9 out of 30 days of use with an average use at 1 hour. Her 95th percentile pressure with minimal use was 14.3 cm and her AHI remained 4.4/h. She felt that the mask was causing her to have significant neck difficulties. As result, she has not used CPAP since. At the time of that evaluation she was unaware of episodes of dizziness. She does not have a blood pressure monitor at home and cannot take her blood pressure but she feels that she is able to go from sitting to standing position without getting dizzy. She wasunaware of any recurrent episodes of obvious atrial fibrillation. She denies chest pain. She denies bleeding on anticoagulation.  She was reevaluated in a telemedicine visit in July 2020.  At that time she continued  continued to have issues with low blood pressure.  Apparently she was evaluated by Dr. Rexene Alberts for her sensory polyneuropathy and lower extremity weakness.  He will be undergoing EMG and nerve conduction studies.  There was concerned that she was drinking wine in excess.  Since I last evaluated her, she has not been using CPAP.  She denies any awareness of recurrent atrial fibrillation.  Her blood pressure has tended to be low.    She was evaluated by [name] in all 2020 for pacemaker evaluation.  There were no recurrent episodes of atrial fibrillation on propafenone.  It was felt that her risks of anticoagulation exceeded the potential benefit particularly she was maintaining sinus rhythm.  Normal pacemaker device function.  Her blood pressure was controlled at that time.  I  saw her in January 2021 and since her prior evaluation she had  significantly Kim up wine.  She has had issues with right leg pruritus.  She was unaware of recurrent atrial fibrillation or chest pain.  Her CPAP machine had been returned.  She was taking midodrine 2.5 mg twice a day, and diltiazem 120 mg at bedtime.  She is no longer taking atenolol but has a prescription and has this as a as needed basis.  She continues  to be on propafenone.  She is on trazodone 37.5 mg daily.  She is here in the office today with her daughter.  I saw her in follow-up in April 2021 at which time she felt improved.  She is living on the ground floor at [facility].  She has more energy.  She is undergoing physical therapy 2 times per week.  She denies any significant symptomatic orthostatic symptoms and is no longer taking midodrine.  She is unaware of any cardiac arrhythmias.  She has not required as needed atenolol.  She was no longer on midodrine so she had a mild systolic drop in blood pressure from 1 38-1 18 going from supine to standing she was asymptomatic and felt well.  She had been seen by [name] for pacemaker check and had a low burden of atrial fibrillation at 0.3% predominantly due to a single 10-hour episode of A. fib which occurred on February 19.  I last saw her in May 15, 2020 at which time she continued to feel well and had more strength.  Unfortunately recently she had ripped and fell  and hit her head. She  went to the emergency room on May 02 2020 and required stitches.  She recently had a pacemaker check by Dr. Eleanora Neighbor was noted to have occasional paroxysmal episodes of atrial fibrillation usually brief but 1 episode up to 4 hours and overall burden 3.5%.  Atrial fibrillation rate control was poor during her episode of A. fib.    Since I last saw her, she was evaluated by [name] in April 2022.  It was felt that her overall burden of atrial fibrillation was higher over the last year but relatively stable at about 5%.  Her rate control was  fair and no additional AV nodal blocking agents were recommended due to her problems with orthostatic hypotension.  At the time she had over 80% atrially paced beats.  Presently, she has continued to gain more strength.  She lives on a downstairs floor.  She is scheduled to see her primary physician Dr. Gayla Doss and will be undergoing laboratory.  She denies any chest pain.  Her previous orthostatic symptomatology has significantly improved.  She presents for evaluation.  Past Medical History:  Diagnosis Date  . Anticoagulant long-term use  WITH PRADAXA 05/27/2011  . Anxiety   . Cellulitis of buttock, left 12/20/2014  . Complication of anesthesia    DIFFICULTY WAKING UP  . Depression   . Diverticulosis   . DJD (degenerative joint disease)   . Esophagitis 09/17/06  . External hemorrhoids   . GERD (gastroesophageal reflux disease)   . Glaucoma   . History of stress test    a. 2008 - normal nuc.  Marland Kitchen Hypertension   . Hypothyroid   . IBS (irritable bowel syndrome)   . Paroxysmal A-fib [facility]) diagnosed 2008   a. On propafenone, Pradaxa.  . 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 BREAST REDUCTION SURGERY    . BUNIONECTOMY Right 07/09/2015   with rods and pins  . CARDIOVERSION N/A 08/02/2014   Procedure: CARDIOVERSION;  Surgeon: Sueanne Margarita, MD;  Location: [facility];  Service: Cardiovascular;  Laterality: N/A;  . CATARACT EXTRACTION, BILATERAL Bilateral   . FACIAL COSMETIC SURGERY    . FOOT SURGERY Left   . HAND SURGERY  multiple  . LIPOSUCTION    . NM MYOCAR PERF WALL MOTION  12/29/2006   No significant ischemia; EF 69%  . OVARIAN CYST REMOVAL    . PACEMAKER INSERTION  04/08/11   MDT Revo implanted by Dr Recardo Evangelist  . TONSILLECTOMY    . TOTAL KNEE ARTHROPLASTY Left 05/01/2015   Procedure: TOTAL LEFT KNEE  ARTHROPLASTY;  Surgeon: Paralee Cancel, MD;  Location: [facility];  Service: Orthopedics;  Laterality: Left;  Marland Kitchen VAGINAL HYSTERECTOMY      Allergies  Allergen Reactions  . Azithromycin Other (See Comments)    Causes heart rhythm issues Other reaction(s): Other (See Comments) Causes heart rhythm issues  . Epinephrine Other (See Comments)    Causes A-Fib Other reaction(s): Increased Heart Rate (intolerance) States that it caused her heart to beat fast, so she will not take it. Causes A-Fib A-fib Triggers atrial fib   . Penicillins Anaphylaxis    Has patient had a PCN reaction causing immediate rash, facial/tongue/throat swelling, SOB or lightheadedness with hypotension: Yes Has patient had a PCN reaction causing severe rash involving mucus membranes or skin necrosis: No Has patient had a PCN reaction that required hospitalization No Has patient had a PCN reaction occurring within the last 10 years: No If all of the above answers are "NO", then may proceed with Cephalosporin use.   . Lidocaine Palpitations    Or any similar medication Other reaction(s): Other (See Comments) Or any similar medication  . Metoprolol Other (See Comments)    alopecia Other reaction(s): Other (See Comments) alopecia    Current Outpatient Medications  Medication Sig Dispense Refill  . atenolol (TENORMIN) 50 MG tablet Take 50 mg by mouth daily as needed (afib).    . diclofenac Sodium (VOLTAREN) 1 % GEL Apply 2 g topically daily as needed (pain).    Marland Kitchen dicyclomine (BENTYL) 10 MG capsule Take 10 mg by mouth 2 (two) times daily.    . digoxin (LANOXIN) 0.125 MG tablet TAKE 1/2 TABLET (0.0625 MG TOTAL) BY MOUTH DAILY. 45 tablet 1  . diltiazem (CARDIZEM CD) 120 MG 24 hr capsule TAKE ONE CAPSULE BY MOUTH EVERY NIGHT AT BEDTIME (Patient taking differently: Take 120 mg by mouth daily.) 90 capsule 3  . escitalopram (LEXAPRO) 20 MG tablet Take 20 mg by mouth daily.    Marland Kitchen HYDROcodone-acetaminophen (NORCO/VICODIN) 5-325 MG  tablet TAKE 1 TABLET BY MOUTH EVERY 6 HOURS AS NEEDED 120 tablet 0  . hydrocortisone (ANUSOL-HC) 2.5 % rectal cream Place 1 application rectally daily as needed for hemorrhoids or anal itching.    . hydroxychloroquine (PLAQUENIL) 200 MG tablet TAKE 1 AND 1/2 TABLETS BY MOUTH WITH FOOD OR MILK ONCE A DAY 90 DAYS 135 tablet 3  . levothyroxine (SYNTHROID) 112 MCG tablet Take 1 tablet (112 mcg total) by mouth daily. 90 tablet 1  . propafenone (RYTHMOL) 150 MG tablet TAKE 1 & 1/2 TABLETS BY MOUTH EVERY MORNING, 1 TABLET BY MOUTH MID-DAY, AND 1 & 1/2 TABLETS AT BEDTIME 360 tablet 0  . traZODone (DESYREL) 150 MG tablet TAKE 1/2 TABLET BY MOUTH ONCE A DAY IN THE EVENING 30 tablet 3  . HYDROcodone-acetaminophen (NORCO/VICODIN) 5-325 MG tablet Take 1 tablet by mouth as needed every 6 hrs 120 tablet 0  . levothyroxine (SYNTHROID, LEVOTHROID) 112 MCG tablet Take 112 mcg by mouth daily before breakfast.      No current facility-administered medications for this visit.    Social History   Socioeconomic History  . Marital status: Divorced  Spouse name: Not on file  . Number of children: 3  . Years of education: Not on file  . Highest education level: Not on file  Occupational History  . Occupation: retired    Fish farm manager: RETIRED  Tobacco Use  . Smoking status: Former Smoker    Quit date: 08/30/1996    Years since quitting: 24.2  . Smokeless tobacco: Never Used  Substance and Sexual Activity  . Alcohol use: Yes    Alcohol/week: 6.0 standard drinks    Types: 6 Glasses of wine per week    Comment: daily  . Drug use: No  . Sexual activity: Yes  Other Topics Concern  . Not on file  Social History Narrative   Lives in [place],  Former [place] [employer],  Separated.     Social Determinants of Health   Financial Resource Strain: Not on file  Food Insecurity: Not on file  Transportation Needs: Not on file  Physical Activity: Not on file  Stress: Not on file  Social Connections: Not on  file  Intimate Partner Violence: Not on file    Family History  Problem Relation Age of Onset  . Anesthesia problems Mother   . Colon polyps Mother        beign  . Colon cancer Neg Hx    ROS General: Negative; No fevers, chills, or night sweats;  HEENT: Negative; No changes in vision or hearing, sinus congestion, difficulty swallowing Pulmonary: Negative; No cough, wheezing, shortness of breath, hemoptysis Cardiovascular:  See HPI GI: Positive for irritable bowel syndrome for which she takes Bentyl before meals;  GU: Negative; No dysuria, hematuria, or difficulty voiding Musculoskeletal: Positive for rheumatoid arthritis, and scoliosis. Hematologic/Oncology: Negative; no easy bruising, bleeding Endocrine: Negative; no heat/cold intolerance; no diabetes Neuro: Negative; no changes in balance, headaches Skin: Negative; No rashes or skin lesions Psychiatric: There is less stress and anxiety; No behavioral problems, depression Sleep: Positive for recently diagnosed OSA.  Stop using CPAP therapy.  PE BP (!) 142/80 (BP Location: Left Arm, Patient Position: Sitting)   Pulse 73   Ht _0  (1.6 m)   Wt 132 lb 9.6 oz (60.1 kg)   SpO2 94%   BMI 23.49 kg/m    Repeat blood pressure by me was 130/78 supine, 128/78 sitting and 116/74 standing  Wt Readings from Last 3 Encounters:  11/19/20 132 lb 9.6 oz (60.1 kg)  10/08/20 129 lb (58.5 kg)  07/26/20 133 lb (60.3 kg)   General: Alert, oriented, no distress.  Appears more frail. Skin: normal turgor, no rashes, warm and dry HEENT: Normocephalic, atraumatic. Pupils equal round and reactive to light; sclera anicteric; extraocular muscles intact;  Nose without nasal septal hypertrophy Mouth/Parynx benign; Mallinpatti scale 3 Neck: No JVD, no carotid bruits; normal carotid upstroke Lungs: clear to ausculatation and percussion; no wheezing or rales Chest wall: without tenderness to palpitation Heart: PMI not displaced, RRR, s1 s2 normal,  1/6 systolic murmur, no diastolic murmur, no rubs, gallops, thrills, or heaves Abdomen: soft, nontender; no hepatosplenomehaly, BS+; abdominal aorta nontender and not dilated by palpation. Back: no CVA tenderness Pulses 2+ Musculoskeletal: full range of motion, normal strength, no joint deformities Extremities: no clubbing cyanosis or edema, Homan's sign negative  Neurologic: grossly nonfocal; Cranial nerves grossly wnl Psychologic: Normal mood and affect   ECG (independently read by me): Atrially paced rhythm at 62 bpm.  Prolonged AV conduction with PR interval of 312 ms, left anterior hemiblock.  QS complex V1 to V3.  QTc  interval 464 ms.  October 03, 2019 ECG (independently read by me): Atrially paced rhythm with prolonged AV conduction with PR interval of 292 ms; QTc interval 458 ms    July 13, 2019 ECG (independently read by me): Atrially paced rhythm at 61 bpm.  Prolonged AV conduction with PR interval to 288 ms.  No ectopy  January 2020 ECG (independently read by me): Lee paced rhythm with prolonged AV conduction.  Ventricular rate 65 bpm.  PR interval 248 ms.  No ectopy.  October 2019 ECG (independently read by me): Atrially paced rhythm with prolonged AV conduction with PR interval 264 ms.  Ventricular rate 64 bpm.  QTc interval 478 ms.  May 2019 ECG (independently read by me): Atrially paced rhythm at 62 bpm with prolonged AV conduction.  PR interval _0 ms.  QTc interval _1 ms.  November 2018 ECG (independently read by me): atrially paced rhythm with prolonged AV conduction with PR interval at 264 ms.  PAC.  QTc interval increase that 503 ms.  May 2018 ECG (independently read by me): Atrially paced rhythm at 69 bpm.  Prolonged AV conduction with PR interval 266 milliseconds.  Increased QTc interval 497 ms.  August 2016 ECG (independently read by me): Atrially paced rhythm at 66 bpm.  PR interval 232 ms.  QTc interval normal at 448 ms.  ECG (independently read by me):  Atrially paced rhythm at 64 bpm.  No AF.  QTC 435 ms.  December 2015 ECG (independently read by me): Atrially paced rhythm at 65 with prolonged AV conduction with a PR interval at 270 ms.  No significant ST segment changes.   June 2015 ECG (independently read by me): Atrially paced rhythm at 62 beats per minute with prolonged AV conduction with a PR interval of 244 ms.  QTc interval 444 ms.  Prior 09/22/2013 ECG (independently read by me): Atrial paced rhythm at 63 beats per minute with ventricular sensing.   LABS:  BMP Latest Ref Rng & Units 08/06/2020 08/04/2020 05/02/2020  Glucose 70 - 99 mg/dL 103(H) 100(H) 89  BUN 8 - 23 mg/dL _2 Creatinine 0.44 - 1.00 mg/dL 0.78 1.10(H) 0.96  Sodium 135 - 145 mmol/L 136 132(L) 132(L)  Potassium 3.5 - 5.1 mmol/L 3.2(L) 4.3 4.3  Chloride 98 - 111 mmol/L 105 99 96(L)  CO2 22 - 32 mmol/L 21(L) 23 23  Calcium 8.9 - 10.3 mg/dL 8.6(L) 9.3 9.5    Hepatic Function Latest Ref Rng & Units 08/04/2020 03/05/2020 01/22/2016  Total Protein 6.5 - 8.1 g/dL 7.4 6.6 6.9  Albumin 3.5 - 5.0 g/dL 4.2 3.8 4.0  AST 15 - 41 U/L 25 54(H) 26  ALT 0 - 44 U/L 16 54(H) 14  Alk Phosphatase 38 - 126 U/L 60 71 40  Total Bilirubin 0.3 - 1.2 mg/dL 1.0 0.8 0.6  Bilirubin, Direct 0.0 - 0.3 mg/dL - - -    CBC Latest Ref Rng & Units 08/06/2020 08/05/2020 08/04/2020  WBC 4.0 - 10.5 K/uL 8.9 10.3 12.7(H)  Hemoglobin 12.0 - 15.0 g/dL 12.1 11.9(L) 12.8  Hematocrit 36.0 - 46.0 % 37.0 35.6(L) 39.4  Platelets 150 - 400 K/uL 187 187 227   Lab Results  Component Value Date   MCV 95.1 08/06/2020   MCV 94.2 08/05/2020   MCV 95.9 08/04/2020    Lab Results  Component Value Date   TSH 1.064 08/05/2020   Lab Results  Component Value Date   HGBA1C 5.3 04/01/2011  BNP    Component Value Date/Time   PROBNP 1,110.0 (H) 09/18/2013 0332    Lipid Panel     Component Value Date/Time   CHOL  12/19/2006 0315    172        ATP III CLASSIFICATION:  <200     mg/dL   Desirable   200-239  mg/dL   Borderline High  >=240    mg/dL   High   TRIG 293 (H) 12/19/2006 0315   HDL 37 (L) 12/19/2006 0315   CHOLHDL 4.6 12/19/2006 0315   VLDL 59 (H) 12/19/2006 0315   LDLCALC  12/19/2006 0315    76        Total Cholesterol/HDL:CHD Risk Coronary Heart Disease Risk Table                     Men   Women  1/2 Average Risk   3.4   3.3     RADIOLOGY: No results found.  IMPRESSION:  1. Paroxysmal atrial fibrillation (HCC)   2. SSS (sick sinus syndrome) ([place])   3. Pacemaker   4. Long term current use of antiarrhythmic drug   5. Rheumatoid arthritis, involving unspecified site, unspecified whether rheumatoid factor present The [facility])     ASSESSMENT AND PLAN: Ms. Neisha Hinger is a 75 year old Caucasian female who has a history of hypertension, PAF and is status post permanent pacemaker for sinus node dysfunction.  She previously was anticoagulated and is no longer on anticoagulation due to fall risk.  She had lost significant weigh.  Had developed significant static symptomatology attributed by some of her medications.  Presently she remains atrially paced at 62 and 80% of the time on her most recent device check was found to be atrially paced.  She has had periods of PAF for which she is unaware of short duration it has been relatively stable at about 5% detected on her most recent device check.  On exam today her blood pressure is stable without significant orthostatic symptomatology although her blood pressure dropped from 130 down to 116 from the supine to standing position.  She has not required use of atenolol which she had used in the past on an as-needed basis.  She continues to be on Cartia XT 120 daily and low-dose digoxin 0.0625 mg as well as propafenone 1-1/2 twice daily of her 150 mg tablet.  She no longer takes the middle of the day dose.  She will be undergoing follow-up laboratory with her primary physician and is scheduled to see him next week.  She continues to be  on hydroxychloroquine for her rheumatoid arthritis.  QTc interval is not significantly prolonged at 464 milliseconds.  She alternates seeing  [name] and me every 6 months.  I will therefore see her in 1 year for follow-up evaluation.   Troy Sine, MD, [facility]  11/21/2020 5:46 PM

## 2020-11-19 NOTE — Patient Instructions (Signed)
Medication Instructions:  Your physician recommends that you continue on your current medications as directed. Please refer to the Current Medication list given to you today.  *If you need a refill on your cardiac medications before your next appointment, please call your pharmacy*   Lab Work: None ordered.   Testing/Procedures: None ordered.    Follow-Up: At [facility], you and your health needs are our priority.  As part of our continuing mission to provide you with exceptional heart care, we have created designated Provider Care Teams.  These Care Teams include your primary Cardiologist (physician) and Advanced Practice Providers (APPs -  Physician Assistants and Nurse Practitioners) who all work together to provide you with the care you need, when you need it.  We recommend signing up for the patient portal called "MyChart".  Sign up information is provided on this After Visit Summary.  MyChart is used to connect with patients for Virtual Visits (Telemedicine).  Patients are able to view lab/test results, encounter notes, upcoming appointments, etc.  Non-urgent messages can be sent to your provider as well.   To learn more about what you can do with MyChart, go to [email or web address].    Your next appointment:   6 month(s)  The format for your next appointment:   In Person  Provider:   Thurmon Fair, MD, See Dr. [name] in 1 year.

## 2020-11-21 ENCOUNTER — Encounter: Payer: Self-pay | Admitting: Cardiovascular Disease

## 2020-11-27 ENCOUNTER — Other Ambulatory Visit (HOSPITAL_COMMUNITY): Payer: Self-pay

## 2020-11-27 MED ORDER — TRIAMCINOLONE ACETONIDE 0.1 % EX CREA
TOPICAL_CREAM | CUTANEOUS | 0 refills | Status: DC
  Filled 2020-11-27: qty 60, 21d supply, fill #0

## 2020-11-28 ENCOUNTER — Other Ambulatory Visit (HOSPITAL_COMMUNITY): Payer: Self-pay

## 2020-12-03 ENCOUNTER — Ambulatory Visit: Payer: Medicare Other | Admitting: Psychology

## 2020-12-04 ENCOUNTER — Encounter: Payer: Medicare Other | Attending: Psychology

## 2020-12-04 ENCOUNTER — Other Ambulatory Visit: Payer: Self-pay

## 2020-12-04 ENCOUNTER — Other Ambulatory Visit (HOSPITAL_COMMUNITY): Payer: Self-pay

## 2020-12-04 DIAGNOSIS — R413 Other amnesia: Secondary | ICD-10-CM

## 2020-12-04 DIAGNOSIS — Z820 Family history of epilepsy and other diseases of the nervous system: Secondary | ICD-10-CM | POA: Diagnosis not present

## 2020-12-04 MED ORDER — TRAZODONE HCL 150 MG PO TABS
ORAL_TABLET | ORAL | 6 refills | Status: DC
  Filled 2020-12-04 – 2021-02-12 (×2): qty 30, 60d supply, fill #0
  Filled 2021-04-19 – 2021-04-22 (×2): qty 30, 60d supply, fill #1
  Filled 2021-06-24: qty 30, 60d supply, fill #2
  Filled 2021-09-08: qty 30, 60d supply, fill #3
  Filled 2021-11-04: qty 30, 60d supply, fill #4

## 2020-12-04 MED FILL — Escitalopram Oxalate Tab 20 MG (Base Equiv): ORAL | 90 days supply | Qty: 90 | Fill #0 | Status: AC

## 2020-12-04 NOTE — Progress Notes (Signed)
   Behavioral Observations  The patient appeared well groomed and appropriately dressed for the testing session. Her manners were appropriate to the situation and she demonstrated a positive attitude toward testing. During the test the patient's left arm began to shake automatically. The patient reports that this does not happen often. The patient frequently misunderstood subtest directions or initially understood directions but began following a new set of directions midway through subtests.   Neuropsychology Note  Jacqueline Kim completed 90 minutes of neuropsychological testing with technician, Marica Otter, BA, under the supervision of Arley Phenix, PsyD., Clinical Neuropsychologist. The patient did not appear overtly distressed by the testing session, per behavioral observation or via self-report to the technician. Rest breaks were offered.   Clinical Decision Making: In considering the patient's current level of functioning, level of presumed impairment, nature of symptoms, emotional and behavioral responses during clinical interview, level of literacy, and observed level of motivation/effort, a battery of tests was selected by Dr. Kieth Brightly during initial consultation on 09/10/20. This was communicated to the technician. Communication between the neuropsychologist and technician was ongoing throughout the testing session and changes were made as deemed necessary based on patient performance on testing, technician observations and additional pertinent factors such as those listed above.  Tests Administered: Controlled Oral Word Association Test (COWAT; FAS & Animals)  Wechsler Memory Scale, 4th Edition (WMS-IV); Older Adult Battery    Results:  Index Score Summary  Index Sum of Scaled Scores Index Score Percentile Rank 95% Confidence Interval Qualitative Descriptor  Auditory Memory (AMI) 20 70 2 65-78 Borderline  Visual Memory (VMI) 3 45 <0.1 42-52 Extremely Low  Immediate Memory  (IMI) 12 63 1 59-71 Extremely Low  Delayed Memory (DMI) 11 61 0.5 56-72 Extremely Low     Primary Subtest Scaled Score Summary  Subtest Domain Raw Score Scaled Score Percentile Rank  Logical Memory I AM 15 5 5   Logical Memory II AM 7 6 9   Verbal Paired Associates I AM 9 6 9   Verbal Paired Associates II AM 1 3 1   Visual Reproduction I VM 2 1 0.1  Visual Reproduction II VM 0 2 0.4  Symbol Span VWM 3 4 2      Auditory Memory Process Score Summary  Process Score Raw Score Scaled Score Percentile Rank Cumulative Percentage (Base Rate)  LM II Recognition 17 - - 26-50%  VPA II Recognition 16 - - 3-9%      Visual Memory Process Score Summary  Process Score Raw Score Scaled Score Percentile Rank Cumulative Percentage (Base Rate)  VR II Recognition 1 - - 3-9%      Feedback to Patient: Jacqueline Kim will return for an interactive feedback session with Dr. at which time her test performances, clinical impressions and treatment recommendations will be reviewed in detail. The patient understands she can contact our office should she require our assistance before this time.  90 minutes spent face-to-face with patient administering standardized tests, 30 minutes spent scoring ). [CPT , 96139]  Full report to follow.

## 2020-12-10 ENCOUNTER — Other Ambulatory Visit (HOSPITAL_COMMUNITY): Payer: Self-pay

## 2020-12-10 MED ORDER — HYDROCODONE-ACETAMINOPHEN 5-325 MG PO TABS
ORAL_TABLET | ORAL | 0 refills | Status: DC
Start: 2020-12-10 — End: 2021-01-10
  Filled 2020-12-13: qty 120, 30d supply, fill #0

## 2020-12-13 ENCOUNTER — Other Ambulatory Visit (HOSPITAL_COMMUNITY): Payer: Self-pay

## 2020-12-19 ENCOUNTER — Other Ambulatory Visit (HOSPITAL_COMMUNITY): Payer: Self-pay

## 2020-12-20 ENCOUNTER — Other Ambulatory Visit (HOSPITAL_COMMUNITY): Payer: Self-pay

## 2020-12-20 MED FILL — Dicyclomine HCl Cap 10 MG: ORAL | 90 days supply | Qty: 270 | Fill #0 | Status: AC

## 2020-12-21 ENCOUNTER — Other Ambulatory Visit (HOSPITAL_COMMUNITY): Payer: Self-pay

## 2020-12-21 MED ORDER — HYDROXYCHLOROQUINE SULFATE 200 MG PO TABS
ORAL_TABLET | ORAL | 0 refills | Status: DC
  Filled 2020-12-21: qty 45, 30d supply, fill #0

## 2020-12-24 ENCOUNTER — Other Ambulatory Visit (HOSPITAL_COMMUNITY): Payer: Self-pay

## 2020-12-31 ENCOUNTER — Ambulatory Visit (INDEPENDENT_AMBULATORY_CARE_PROVIDER_SITE_OTHER): Payer: Medicare Other

## 2020-12-31 DIAGNOSIS — I495 Sick sinus syndrome: Secondary | ICD-10-CM

## 2020-12-31 LAB — CUP PACEART REMOTE DEVICE CHECK
Battery Voltage: 2.86 V
Brady Statistic AP VP Percent: 0.01 %
Brady Statistic AP VS Percent: 97.61 %
Brady Statistic AS VP Percent: 0 %
Brady Statistic AS VS Percent: 2.39 %
Brady Statistic RA Percent Paced: 97.61 %
Brady Statistic RV Percent Paced: 0.01 %
Date Time Interrogation Session: 20220718141957
Implantable Lead Implant Date: 20121023
Implantable Lead Implant Date: 20121023
Implantable Lead Location: 753859
Implantable Lead Location: 753860
Implantable Pulse Generator Implant Date: 20121023
Lead Channel Impedance Value: 1472 Ohm
Lead Channel Impedance Value: 408 Ohm
Lead Channel Sensing Intrinsic Amplitude: 2.043 mV
Lead Channel Sensing Intrinsic Amplitude: 8.368 mV
Lead Channel Setting Pacing Amplitude: 2 V
Lead Channel Setting Pacing Amplitude: 2.5 V
Lead Channel Setting Pacing Pulse Width: 0.6 ms
Lead Channel Setting Sensing Sensitivity: 0.9 mV

## 2021-01-03 ENCOUNTER — Other Ambulatory Visit: Payer: Self-pay

## 2021-01-03 ENCOUNTER — Encounter (INDEPENDENT_AMBULATORY_CARE_PROVIDER_SITE_OTHER): Payer: Self-pay | Admitting: Ophthalmology

## 2021-01-03 ENCOUNTER — Ambulatory Visit (INDEPENDENT_AMBULATORY_CARE_PROVIDER_SITE_OTHER): Payer: Medicare Other | Admitting: Ophthalmology

## 2021-01-03 DIAGNOSIS — H353222 Exudative age-related macular degeneration, left eye, with inactive choroidal neovascularization: Secondary | ICD-10-CM

## 2021-01-03 DIAGNOSIS — H353133 Nonexudative age-related macular degeneration, bilateral, advanced atrophic without subfoveal involvement: Secondary | ICD-10-CM

## 2021-01-03 DIAGNOSIS — H353112 Nonexudative age-related macular degeneration, right eye, intermediate dry stage: Secondary | ICD-10-CM

## 2021-01-03 NOTE — Progress Notes (Signed)
01/03/2021     CHIEF COMPLAINT Patient presents for Macular Degeneration   HISTORY OF PRESENT ILLNESS: Jacqueline Kim is a 75 y.o. female who presents to the clinic today for:   HPI   1 year follow-up with a history of prior injection for wet AMD.  No interval change no new distortions or loss of vision in either eye Last edited by Edmon Crape, MD on 01/03/2021  1:35 PM.      Referring physician: Georgianne Fick, MD [address] [place],  [place] [address]  HISTORICAL INFORMATION:   Selected notes from the MEDICAL RECORD NUMBER    Lab Results  Component Value Date   HGBA1C 5.3 04/01/2011     CURRENT MEDICATIONS: No current outpatient medications on file. (Ophthalmic Drugs)   No current facility-administered medications for this visit. (Ophthalmic Drugs)   Current Outpatient Medications (Other)  Medication Sig   atenolol (TENORMIN) 50 MG tablet Take 50 mg by mouth daily as needed (afib).   diclofenac Sodium (VOLTAREN) 1 % GEL Apply 2 g topically daily as needed (pain).   dicyclomine (BENTYL) 10 MG capsule Take 10 mg by mouth 2 (two) times daily.   dicyclomine (BENTYL) 10 MG capsule TAKE 1 CAPSULE BY MOUTH THREE TIMES A DAY AS NEEDED 90   digoxin (LANOXIN) 0.125 MG tablet TAKE 1/2 TABLET (0.0625 MG TOTAL) BY MOUTH DAILY.   diltiazem (CARDIZEM CD) 120 MG 24 hr capsule TAKE ONE CAPSULE BY MOUTH EVERY NIGHT AT BEDTIME (Patient taking differently: Take 120 mg by mouth daily.)   escitalopram (LEXAPRO) 20 MG tablet Take 20 mg by mouth daily.   escitalopram (LEXAPRO) 20 MG tablet TAKE ONE (1) TABLET BY MOUTH EVERY DAY 90   HYDROcodone-acetaminophen (NORCO/VICODIN) 5-325 MG tablet TAKE 1 TABLET BY MOUTH EVERY 6 HOURS AS NEEDED   HYDROcodone-acetaminophen (NORCO/VICODIN) 5-325 MG tablet Take 1 tablet by mouth every 6 hours as needed   hydrocortisone (ANUSOL-HC) 2.5 % rectal cream Place 1 application rectally daily as needed for hemorrhoids or anal itching.    hydroxychloroquine (PLAQUENIL) 200 MG tablet Take 1 and 1/2 tablets by mouth with food or milk once a day 30 days   levothyroxine (SYNTHROID) 112 MCG tablet Take 1 tablet (112 mcg total) by mouth daily.   levothyroxine (SYNTHROID, LEVOTHROID) 112 MCG tablet Take 112 mcg by mouth daily before breakfast.    propafenone (RYTHMOL) 150 MG tablet TAKE 1 & 1/2 TABLETS BY MOUTH EVERY MORNING, 1 TABLET BY MOUTH MID-DAY, AND 1 & 1/2 TABLETS AT BEDTIME   traZODone (DESYREL) 150 MG tablet TAKE 1/2 TABLET BY MOUTH ONCE A DAY IN THE EVENING   triamcinolone cream (KENALOG) 0.1 % Apply 1 application Twice a day   No current facility-administered medications for this visit. (Other)      REVIEW OF SYSTEMS: ROS   Negative for: Constitutional, Gastrointestinal, Neurological, Skin, Genitourinary, Musculoskeletal, HENT, Endocrine, Cardiovascular, Eyes, Respiratory, Psychiatric, Allergic/Imm, Heme/Lymph Last edited by Edmon Crape, MD on 01/03/2021  1:34 PM.       ALLERGIES Allergies  Allergen Reactions   Azithromycin Other (See Comments)    Causes heart rhythm issues Other reaction(s): Other (See Comments) Causes heart rhythm issues   Epinephrine Other (See Comments)    Causes A-Fib Other reaction(s): Increased Heart Rate (intolerance) States that it caused her heart to beat fast, so she will not take it. Causes A-Fib A-fib Triggers atrial fib    Penicillins Anaphylaxis    Has patient had a PCN  reaction causing immediate rash, facial/tongue/throat swelling, SOB or lightheadedness with hypotension: Yes Has patient had a PCN reaction causing severe rash involving mucus membranes or skin necrosis: No Has patient had a PCN reaction that required hospitalization No Has patient had a PCN reaction occurring within the last 10 years: No If all of the above answers are "NO", then may proceed with Cephalosporin use.    Lidocaine Palpitations    Or any similar medication Other reaction(s): Other (See  Comments) Or any similar medication   Metoprolol Other (See Comments)    alopecia Other reaction(s): Other (See Comments) alopecia    PAST MEDICAL HISTORY Past Medical History:  Diagnosis Date   Anticoagulant long-term use  WITH PRADAXA 05/27/2011   Anxiety    Cellulitis of buttock, left 12/20/2014   Complication of anesthesia    DIFFICULTY WAKING UP   Depression    Diverticulosis    DJD (degenerative joint disease)    Esophagitis 09/17/06   External hemorrhoids    GERD (gastroesophageal reflux disease)    Glaucoma    History of stress test    a. 2008 - normal nuc.   Hypertension    Hypothyroid    IBS (irritable bowel syndrome)    Paroxysmal A-fib (HCC) diagnosed 2008   a. On propafenone, Pradaxa.   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   s/p MDT PPM by Dr Rubie Maid   Past Surgical History:  Procedure Laterality Date   BREAST REDUCTION SURGERY     BUNIONECTOMY Right 07/09/2015   with rods and pins   CARDIOVERSION N/A 08/02/2014   Procedure: CARDIOVERSION;  Surgeon: Quintella Reichert, MD;  Location: [facility];  Service: Cardiovascular;  Laterality: N/A;   CATARACT EXTRACTION, BILATERAL Bilateral    FACIAL COSMETIC SURGERY     FOOT SURGERY Left    HAND SURGERY     multiple   LIPOSUCTION     NM MYOCAR PERF WALL MOTION  12/29/2006   No significant ischemia; EF 69%   OVARIAN CYST REMOVAL     PACEMAKER INSERTION  04/08/11   MDT Revo implanted by Dr Rubie Maid   TONSILLECTOMY     TOTAL KNEE ARTHROPLASTY Left 05/01/2015   Procedure: TOTAL LEFT KNEE ARTHROPLASTY;  Surgeon: Durene Romans, MD;  Location: [facility];  Service: Orthopedics;  Laterality: Left;   VAGINAL HYSTERECTOMY      FAMILY HISTORY Family History  Problem Relation Age of Onset   Anesthesia problems Mother    Colon polyps Mother        beign   Colon cancer Neg Hx     SOCIAL HISTORY Social History   Tobacco Use   Smoking status:  Former    Types: Cigarettes    Quit date: 08/30/1996    Years since quitting: 24.3   Smokeless tobacco: Never  Substance Use Topics   Alcohol use: Yes    Alcohol/week: 6.0 standard drinks    Types: 6 Glasses of wine per week    Comment: daily   Drug use: No         OPHTHALMIC EXAM:  Base Eye Exam     Visual Acuity (ETDRS)       Right Left   Dist [place] 20/50 20/80   Dist ph [place] 20/40 20/40 -1         Tonometry (Tonopen, 1:33 PM)       Right Left   Pressure 12 11  Pupils       Pupils React APD   Right PERRL Brisk None   Left PERRL Brisk None         Visual Fields       Left Right    Full Full         Extraocular Movement       Right Left    Full, Ortho Full, Ortho         Neuro/Psych     Oriented x3: Yes         Dilation     Both eyes: 1.0% Mydriacyl, 2.5% Phenylephrine @ 1:32 PM           Slit Lamp and Fundus Exam     External Exam       Right Left   External Normal Normal         Slit Lamp Exam       Right Left   Lids/Lashes Normal Normal   Conjunctiva/Sclera White and quiet White and quiet   Cornea Clear Clear   Anterior Chamber Deep and quiet Deep and quiet   Iris Round and reactive Round and reactive   Lens Centered posterior chamber intraocular lens Centered posterior chamber intraocular lens   Anterior Vitreous Normal Normal         Fundus Exam       Right Left   Posterior Vitreous Posterior vitreous detachment Posterior vitreous detachment   Disc Normal Normal   C/D Ratio 0.6 0.6   Macula Soft drusen, Retinal pigment epithelial mottling Soft drusen, Retinal pigment epithelial mottling, Geographic atrophy   Vessels Normal Normal   Periphery Normal Normal            IMAGING AND PROCEDURES  Imaging and Procedures for 01/03/21  OCT, Retina - OU - Both Eyes       Right Eye Quality was good. Scan locations included subfoveal. Central Foveal Thickness: 233. Progression has improved. Findings  include abnormal foveal contour, retinal drusen .   Left Eye Quality was good. Scan locations included subfoveal. Central Foveal Thickness: 216. Progression has improved. Findings include abnormal foveal contour, retinal drusen .   Notes No signs of active CNVM OU  Interestingly, large subfoveal drusenoid deposits in the right eye with large volume seen and detected on last OCT dated 05/31/2018 in the right eye Have now abated.  OS no signs of new CNVM.  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 Intermediate stage nonexudative age-related macular degeneration of right eye  H35.3112 OCT, Retina - OU - Both Eyes    3. 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  No signs of active CNVM OU.  2.  Stable moderate to intermediate ARMD with preserved acuity  3.  No signs of Plaquenil toxicity by OCT monitoring  Okay to continue on 300 mg daily of Plaquenil patient reports that she will be using a  200 mg 1 day and 400 mg the next  I explained to the family that best to err on the side of less dosing  Ophthalmic Meds Ordered this visit:  No orders of the defined types were placed in this encounter.      Return in about 1 year (around 01/03/2022) for DILATE OU, OCT, COLOR FP.  There are no Patient Instructions on file for this visit.   Explained the diagnoses, plan, and follow up with the  patient and they expressed understanding.  Patient expressed understanding of the importance of proper follow up care.   Alford Highland Robie Mcniel M.D. Diseases & Surgery of the Retina and Vitreous [facility] 01/03/21     Abbreviations: M myopia (nearsighted); A astigmatism; H hyperopia (farsighted); P presbyopia; Mrx spectacle prescription;  CTL contact lenses; OD right eye; OS left eye; OU both eyes  XT exotropia; ET esotropia; PEK punctate epithelial keratitis; PEE punctate epithelial erosions; DES dry eye syndrome; MGD meibomian gland dysfunction; ATs artificial tears; PFAT's preservative free artificial tears; NSC nuclear sclerotic cataract; PSC posterior subcapsular cataract; ERM epi-retinal membrane; PVD posterior vitreous detachment; RD retinal detachment; DM diabetes mellitus; DR diabetic retinopathy; NPDR non-proliferative diabetic retinopathy; PDR proliferative diabetic retinopathy; CSME clinically significant macular edema; DME diabetic macular edema; dbh dot blot hemorrhages; CWS cotton wool spot; POAG primary open angle glaucoma; C/D cup-to-disc ratio; HVF humphrey visual field; GVF goldmann visual field; OCT optical coherence tomography; IOP intraocular pressure; BRVO Branch retinal vein occlusion; CRVO central retinal vein occlusion; CRAO central retinal artery occlusion; BRAO branch retinal artery occlusion; RT retinal tear; SB scleral buckle; PPV pars plana vitrectomy; VH Vitreous hemorrhage; PRP panretinal laser photocoagulation; IVK intravitreal kenalog; VMT vitreomacular traction; MH Macular hole;  NVD neovascularization of the disc; NVE neovascularization elsewhere; AREDS age related eye disease study; ARMD age related macular degeneration; POAG primary open angle glaucoma; EBMD epithelial/anterior basement membrane dystrophy; ACIOL anterior chamber intraocular lens; IOL intraocular lens; PCIOL posterior chamber intraocular lens; Phaco/IOL phacoemulsification with intraocular  lens placement; PRK photorefractive keratectomy; LASIK laser assisted in situ keratomileusis; HTN hypertension; DM diabetes mellitus; COPD chronic obstructive pulmonary disease

## 2021-01-03 NOTE — Assessment & Plan Note (Signed)
Stable on accounts for acuity

## 2021-01-03 NOTE — Assessment & Plan Note (Signed)
OS, no signs of active CNVM

## 2021-01-10 ENCOUNTER — Other Ambulatory Visit (HOSPITAL_COMMUNITY): Payer: Self-pay

## 2021-01-10 MED ORDER — HYDROCODONE-ACETAMINOPHEN 5-325 MG PO TABS
1.0000 | ORAL_TABLET | Freq: Four times a day (QID) | ORAL | 0 refills | Status: DC | PRN
  Filled 2021-01-10: qty 113, 29d supply, fill #0
  Filled 2021-01-10: qty 7, 1d supply, fill #0

## 2021-01-10 MED ORDER — TRIAMCINOLONE ACETONIDE 0.1 % EX CREA
1.0000 "application " | TOPICAL_CREAM | Freq: Two times a day (BID) | CUTANEOUS | 3 refills | Status: DC | PRN
  Filled 2021-01-10 (×2): qty 454, 30d supply, fill #0

## 2021-01-10 MED ORDER — CLOBETASOL PROPIONATE 0.05 % EX SOLN
1.0000 "application " | Freq: Two times a day (BID) | CUTANEOUS | 3 refills | Status: DC | PRN
  Filled 2021-01-10: qty 50, 25d supply, fill #0

## 2021-01-11 ENCOUNTER — Other Ambulatory Visit (HOSPITAL_COMMUNITY): Payer: Self-pay

## 2021-01-17 ENCOUNTER — Other Ambulatory Visit: Payer: Self-pay

## 2021-01-17 ENCOUNTER — Encounter: Payer: Medicare Other | Attending: Psychology | Admitting: Psychology

## 2021-01-17 DIAGNOSIS — G4733 Obstructive sleep apnea (adult) (pediatric): Secondary | ICD-10-CM

## 2021-01-17 DIAGNOSIS — R413 Other amnesia: Secondary | ICD-10-CM | POA: Diagnosis present

## 2021-01-17 DIAGNOSIS — F0280 Dementia in other diseases classified elsewhere without behavioral disturbance: Secondary | ICD-10-CM | POA: Insufficient documentation

## 2021-01-17 DIAGNOSIS — G309 Alzheimer's disease, unspecified: Secondary | ICD-10-CM | POA: Insufficient documentation

## 2021-01-17 NOTE — Progress Notes (Signed)
Neuropsychological Evaluation   Patient:  Jacqueline Kim   DOB: 12/31/1945  MR Number: [id number]  Location: [facility] PH[facility] MEDICINE AND REHABILITATION [address] [place], [address] [id number] [facility] [place] [address] Dept: [id number]  Start: 8 AM End: 9 AM  Provider/Observer:     Hershal Coria PsyD  Chief Complaint:      Chief Complaint  Patient presents with   Memory Loss     Reason For Service:      Jacqueline Kim is a 75 year old female referred for neuropsychological evaluation by her treating neurologist Jacqueline Foley, MD.  She was initially referred for broader neurological evaluation by the patient's PCP Jacqueline Bailey, MD due to subjective reports of memory loss.  The patient has a past medical history including arthritis, degenerative spine disease, hypertension, A. fib, hypothyroidism, history of congestive heart failure, reflux disease, depression/anxiety, glaucoma, irritable bowel syndrome, status post pacemaker placement, hyponatremia, gait disorder with recurrent falls.  The patient is described as having progressive short-term memory deficits and the patient reports some word finding difficulties.  The patient reports that if she cannot remember something she tends to remember it later after period of time has passed.  The patient's daughter reports that these difficulties have been going on for the past 8 to 12 months and she sees them as progressing.     While long-term memory is reported to be quite good there are increasing self-reports of short-term memory issues over the past year and the patient reports that she is getting more forgetful.  The patient quit smoking around 15 years ago and has had a significant reduction in alcohol consumption and currently the patient is reported to have approximately 1 large bottle of wine over a 3-day period of time.  The patient has a long history of balance and  gait issues and has had a history of scoliosis and other spinal issues contributing to her gait difficulties.  She does need assistance for getting a shower due to balance difficulties.  She has participated in physical therapy.   The patient and her daughter both deny any symptoms related to tremors, visual hallucinations or difficulties with visual-spatial/visual constructional or geographic disorientation type symptoms.   The patient has had recent metabolic issues that have been disturbed and issues with UTIs that have exacerbated her symptoms.  The patient's daughter Jacqueline Kim reports that more recently when the patient has dealt with a UTI that it is taking the patient longer to get over and the patient is shown significant impact on physical functioning and cognitive functioning with each infectious event.  The patient had a 3-day stay in a nursing rehabilitation program after a fall in August 2019.  Ongoing medical issues include problems with her back, osteoporosis, RA and A. fib.   There is a family history of some memory difficulties with her mother.  The mother's memory and cognitive issues developed after age 13 and the patient's mother lived to 13 years old.  Maternal grandmother is also reported to have had memory issues later in life.  The patient does not have any family information about her biological father.   The patient has had some issues with sleep and at times particular more recently has been up late at night and then sleeps late into the morning close to noon.  The patient reports that she will watch TV late at night.  Appetite is described as okay and the patient does not  cook and most food is ordered.  She is not reported to have lost any weight recently but she does not tend to eat formal meals and eats a lot of snacks including fresh fruits.  Water intake is a concern.  The patient does have regular care from her daughters who live in the area and see her every day.  The patient  also has a son but he lives in [place].   The patient has had prior CT scans of her head as well as spine.  The patient is not able to have an MRI due to having a pacemaker.  CT head has shown no evidence of acute intracranial abnormality and mild cerebral atrophy with mild chronic small vessel ischemic disease.  Tests Administered:  Wechsler Adult Intelligence Scale, 4th Edition (WAIS-IV) Controlled Oral Word Association Test (COWAT; FAS & Animals) Wechsler Memory Scale, 4th Edition (WMS-IV); Older Adult Battery Clock Drawing Test  Participation Level:   Active  Participation Quality:  Appropriate      Behavioral Observation:  Well Groomed, Alert, and Appropriate.   Test Results:   Initially, an estimation was made as to the patient's historic/premorbid intellectual and cognitive functioning.  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  We will utilize a conservative estimate for estimation of historical/premorbid functioning.  I will estimate that the patient likely has functioned in the upper end of the average range conservatively and we will utilize a historic/premorbid estimation of 110 as far as composite scores.  The patient was administered the controlled oral Word Association test including the FAS and animal subtest.  Performance on these measures suggest that the patient is showing efficient and adequate word finding and expressive abilities.  There was no reduction in verbal fluency and no indications of expressive language deficits for verbal fluency measures or observations during clinical interview of paraphasic errors or circumlocutions etc.  Composite Score Summary  Scale Sum of Scaled Scores Composite Score Percentile Rank 95% Conf. Interval Qualitative Description  Verbal Comprehension 33 VCI  105 63 99-110 Average  Perceptual Reasoning 15 PRI 71 3 66-79 Borderline  Working Memory 10 WMI 71 3 66-80 Borderline  Processing Speed 9 PSI 71 3 66-82 Borderline  Full Scale 67 FSIQ 77 6 73-82 Borderline  General Ability 48 GAI 87 19 82-92 Low Average   The patient was administered the Wechsler Adult Intelligence Scale-for for older adults version.  Initially we calculated composite scores including full-scale IQ score and general abilities index.  These global composite score should not be used estimated the patient's lifelong intellectual cognitive functioning as the patient and in particular her daughter are reporting significant changes at least over the past year as far as cognitive functioning.  These instead should be used as an estimation of current cognitive functioning in a wide range of various cognitive domains.  The patient produced a full-scale IQ score of 77 which places her at the 6 percentile in the borderline range relative to a normative population.  This is significantly below predicted levels and suggest multiple areas of changes in overall cognitive functioning.  We also calculated the patient's general abilities index score which places less emphasis on encoding abilities and information processing speed variables as these are typically measures most sensitive to acute changes.  The patient produced a general abilities index score  of 87 which fell at the 19th percentile and was in the low average range.  Verbal Comprehension Subtests Summary  Subtest Raw Score Scaled Score Percentile Rank Reference Group Scaled Score SEM  Similarities 24 11 63 10 1.12  Vocabulary 41 11 63 12 0.73   The patient produced a verbal comprehension index score of 105 which places her at the 63rd percentile and is in the average range relative to a normative population.  There was no scatter noted within subtest performance and the patient performed well on measures of verbal reasoning and vocabulary  skills suggesting good maintenance of these abilities and are not consistent with any particular decline currently from historical functioning.  Perceptual Reasoning Subtests Summary  Subtest Raw Score Scaled Score Percentile Rank Reference Group Scaled Score SEM  Block Design 4 2 0.4 1 1.27  Matrix Reasoning 5 6 9 2  0.73  Visual Puzzles 6 7 16 4  0.99   The patient produced a perceptual reasoning index score of 71 which falls in the 3rd percentile and is in the borderline range relative to a normative population.  This is significantly below predicted levels and given the patient's occupational history suggest significant decline as these were likely some of her historical strengths given her work as a .  The patient showed significant deficits for visual analysis and organization visual reasoning and visual estimation abilities.  Visual constructional abilities as analyzed by the clock drawing test, which was also administered indicate significant profound visual constructional deficits.  The patient had great difficulty organizing and implementing a simple task of drawing a clock face and was unable to accurately plan or draw both the hour and minute hands and had significant confusion with number placements.  In fact, the patient mirrored the clock on both the right and left side of the clock face going from 12-11 10 9  and so forth on both sides of the clock face.  She left out 1 through 5 numbers and instead replicated a mirror image of her clock face.  This is a significant disturbance in visual-spatial, visual constructional and visual processing capacity.  Working Raw Score Scaled Score Percentile Rank Reference Group Scaled Score SEM  Digit Span 10 2 0.4 1 0.79  Arithmetic 10 8 25 7  0.95   The patient produced a working memory index score of 71 which falls in the 3rd percentile and is in the borderline range relative to a normative population.   This is a significantly impaired score relative to historic functions.  However, there was great difference between the patient's pure auditory encoding measures (digit span) and measures requiring manipulation of those initially coded information (arithmetic).  This does suggest that the patient does better on measures where she is forced to focus on encoding factors and suggest that her encoding abilities may not be as impaired as suggested by her digit span subtest.  Processing Speed Subtests Summary  Subtest Raw Score Scaled Score Percentile Rank Reference Group Scaled Score SEM  Symbol Search 10 5 5 2  1.12  Coding 17 4 2 1  1.12   The patient produced a processing speed index score of 71 which places her at the 3rd percentile and is in the borderline range relative to a normative population.  This is a significantly impaired score relative to visual scanning, visual searching and overall speed of mental operations functions.  Index Score Summary        Index Sum of Scaled Scores Index  Score Percentile Rank 95% Confidence Interval Qualitative Descriptor  Auditory Memory (AMI) 20 70 2 65-78 Borderline  Visual Memory (VMI) 3 45 <0.1 42-52 Extremely Low  Immediate Memory (IMI) 12 63 1 59-71 Extremely Low  Delayed Memory (DMI) 11 61 0.5 56-72 Extremely Low    We also administered the Weschler memory scale-for for older adults.  This provides a well normed highly standardized assessment of a broad range of memory functions.  The patient showed considerable variability for auditory encoding doing much more poorly on measures of pure auditory encoding and better on measures that required some manipulation and processing of auditory stores.  The patient showed similar weaknesses with regard to visual encoding abilities.  This level of variability and difficulties with primary encoding capacity will have an impact on the patient's ability to learn new information and effectively store and organize  that information for later recall.  Breaking the patient's memory down between auditory versus visual memory the patient produced an auditory memory index score of 70 which falls at the 2nd percentile in the borderline range.  This is a significantly impaired score and more than 30 points below predicted levels based on both her vocabulary and verbal based skills as well as education occupational history.  The patient showed even more profound deficits with regard to visual memory as she produced a visual memory index score of 45 which falls below the 1st percentile in the extremely low range.  This is a significantly impaired score but likely reflects a compounding of memory difficulties with previously identified significant deficits with regard to visual analysis and organization and other visual spatial deficits.  Breaking the patient's memory functions down between immediate versus delayed memory the patient produced an immediate memory index score of 63 which is at the 1st percentile in the extremely low range.  Similar deficits were noted for delayed memory as she produced a delayed memory index score of 61 that fell below the 1st percentile and in the extremely low range relative to a normative population.  All individual subtest as noted below for both auditory and visual memory were significantly impaired and there were no individual aspects of her memory and learning that did not show significant deficits.  This included both auditory and visual encoding capacity, auditory and visual immediate learning and auditory and visual delayed memory and learning.  Also as noted below, the patient did not show any significant improvement at all under recognition/cued recall formats.  This pattern suggest significant deficits with regard to encoding, organizing and storing newly learned information and her memory deficits do not reflect simple retrieval deficits.          Primary Subtest Scaled Score Summary       Subtest Domain Raw Score Scaled Score Percentile Rank  Logical Memory I AM Logical Memory II AM Verbal Paired Associates I AM Verbal Paired Associates II AM Visual Reproduction I VM 2 1 0.1  Visual Reproduction II VM 0 2 0.4  Symbol Span VWM Auditory Memory Process Score Summary      Process Score Raw Score Scaled Score Percentile Rank Cumulative Percentage (Base Rate)  LM II Recognition 17 - - 26-50%  VPA II Recognition 16 - - 3-9%  Visual Memory Process Score Summary     Process Score Raw Score Scaled Score Percentile Rank Cumulative Percentage (Base Rate)  VR II Recognition 1 - - 3-9%     Summary of Results:   Overall, the results of the current neuropsychological evaluation with regard to objective testing do suggest that the patient is showing well-preserved expressive language abilities and verbal fluency capacity and is also showing well-preserved verbal reasoning and problem-solving and good vocabulary skills.  However, the patient is showing significant and profound deficits in a number of cognitive domains.  This includes significant visual-spatial, visual reasoning and visual constructional deficits, significant deficits with regard to information processing speed and overall speed of mental operations and significant deficits with regard to memory and learning.  These memory and learning deficits were found for both visual and auditory learning capacity with mild to moderate deficits for encoding both visual and auditory information and profound deficits with regard to her ability to store, organize and retrieve information after period of delay.  In-depth analysis of various subtest performance including cued recall and recognition format suggest that the patient is unable to effectively encode, store and organize newly learned information both auditorily and visually.  Impression/Diagnosis:   Overall, the  results of the current neuropsychological evaluation taking into account subjective reports by both the patient and her daughter, available medical information including recent CT scan as well as other objective medical information suggest that the patient is displaying significant loss of cognitive functioning and a broad range of areas.  These include functioning typically associated with frontal, temporal and parietal lobe functions.  The patient maintains good verbal skills and verbal knowledge but all other areas assessed during this objective assessment were significantly and in some cases profoundly impaired.  Visual-spatial and visual constructional functioning along with memory deficits were her most profound areas of impairment.  She also showed significant reduction in visual scanning and visual searching and overall speed of mental operations, auditory and visual encoding deficits etc.  As far as diagnostic consideration the patient has had numerous recent medical issues that likely are playing a significant role in acute exacerbations of some of her symptoms.  Multiple UTIs have been noted as well as sustained issues with significant pain, spinal issues and gait abnormalities.  The patient did not show any significant microvascular ischemic changes/small vessel disease and only mild indications of cortical atrophy although an MRI was not able to be conducted due to the patient having a pacemaker.  The patient denies any tremor, visual hallucination and her gait disturbance could be explained by spinal abnormalities that have been around for some time.  At this point, the patient clearly meets the criterion for dementia and a major neurocognitive disorder with the most likely explanation being relative to Alzheimer's disease.  There are no indications of significant behavioral disturbance.  However, the patient has had significant medical issues including significant infectious process and metabolic  imbalance, obstructive sleep apnea diagnoses in the past and significant issues with pain and other gait abnormalities likely due to back issues.  While these can all contribute to changes in cognitive functioning the level of deficits noted today would go well beyond that.  As far as recommendations going forward and will be important to follow the patient over time to assess for progressive changes to more definitively define the patient's diagnostic considerations.  However, the patient is showing deficits that would require more close monitoring by family.  The patient should not be driving  given the level of cognitive deficits particularly for visual spatial and visual processing and visual information processing speed.  There likely some significant concerns about performing various ADLs at home including cooking and maintenance of her house around safety issues.  The patient does have regular contact with her daughter and will likely need increasing assistance going forward.  As far as more in-depth recommendations I will sit down with the patient and her daughter on 01/21/2021 and address other specific recommendations.  We will look at reassessment in approximately 9 months to more definitively describe diagnostic considerations.  Diagnosis:    Major neurocognitive disorder due to Alzheimer's disease, without behavioral disturbance (HCC)  Memory loss  Obstructive sleep apnea of adult   _____________________ Arley Phenix, Psy.D. Clinical Neuropsychologist

## 2021-01-21 ENCOUNTER — Encounter (HOSPITAL_BASED_OUTPATIENT_CLINIC_OR_DEPARTMENT_OTHER): Payer: Medicare Other | Admitting: Psychology

## 2021-01-21 ENCOUNTER — Other Ambulatory Visit: Payer: Self-pay

## 2021-01-21 ENCOUNTER — Other Ambulatory Visit (HOSPITAL_COMMUNITY): Payer: Self-pay

## 2021-01-21 DIAGNOSIS — G4733 Obstructive sleep apnea (adult) (pediatric): Secondary | ICD-10-CM | POA: Diagnosis not present

## 2021-01-21 DIAGNOSIS — G309 Alzheimer's disease, unspecified: Secondary | ICD-10-CM

## 2021-01-21 DIAGNOSIS — R413 Other amnesia: Secondary | ICD-10-CM

## 2021-01-21 DIAGNOSIS — F0280 Dementia in other diseases classified elsewhere without behavioral disturbance: Secondary | ICD-10-CM

## 2021-01-21 MED ORDER — HYDROXYCHLOROQUINE SULFATE 200 MG PO TABS
ORAL_TABLET | Freq: Every day | ORAL | 0 refills | Status: DC
  Filled 2021-01-21: qty 45, 30d supply, fill #0

## 2021-01-23 NOTE — Progress Notes (Signed)
Remote pacemaker transmission.   

## 2021-01-28 ENCOUNTER — Other Ambulatory Visit (HOSPITAL_COMMUNITY): Payer: Self-pay

## 2021-01-28 NOTE — Progress Notes (Signed)
01/21/2021: 1 PM-2 PM  Today's visit was an in person visit that was conducted in my outpatient clinic office with the patient and her family present.  Today I provided feedback regarding the results of the recent neuropsychological evaluation as well as looking at follow-up testing needed and treatment recommendations.  Below you will find a copy of the summary and impressions from the complete neuropsychological evaluation that can be found in the patient's EMR dated 01/17/2021.   Summary of Results:                        Overall, the results of the current neuropsychological evaluation with regard to objective testing do suggest that the patient is showing well-preserved expressive language abilities and verbal fluency capacity and is also showing well-preserved verbal reasoning and problem-solving and good vocabulary skills.  However, the patient is showing significant and profound deficits in a number of cognitive domains.  This includes significant visual-spatial, visual reasoning and visual constructional deficits, significant deficits with regard to information processing speed and overall speed of mental operations and significant deficits with regard to memory and learning.  These memory and learning deficits were found for both visual and auditory learning capacity with mild to moderate deficits for encoding both visual and auditory information and profound deficits with regard to her ability to store, organize and retrieve information after period of delay.  In-depth analysis of various subtest performance including cued recall and recognition format suggest that the patient is unable to effectively encode, store and organize newly learned information both auditorily and visually.   Impression/Diagnosis:                     Overall, the results of the current neuropsychological evaluation taking into account subjective reports by both the patient and her daughter, available medical information  including recent CT scan as well as other objective medical information suggest that the patient is displaying significant loss of cognitive functioning and a broad range of areas.  These include functioning typically associated with frontal, temporal and parietal lobe functions.  The patient maintains good verbal skills and verbal knowledge but all other areas assessed during this objective assessment were significantly and in some cases profoundly impaired.  Visual-spatial and visual constructional functioning along with memory deficits were her most profound areas of impairment.  She also showed significant reduction in visual scanning and visual searching and overall speed of mental operations, auditory and visual encoding deficits etc.  As far as diagnostic consideration the patient has had numerous recent medical issues that likely are playing a significant role in acute exacerbations of some of her symptoms.  Multiple UTIs have been noted as well as sustained issues with significant pain, spinal issues and gait abnormalities.  The patient did not show any significant microvascular ischemic changes/small vessel disease and only mild indications of cortical atrophy although an MRI was not able to be conducted due to the patient having a pacemaker.  The patient denies any tremor, visual hallucination and her gait disturbance could be explained by spinal abnormalities that have been around for some time.  At this point, the patient clearly meets the criterion for dementia and a major neurocognitive disorder with the most likely explanation being relative to Alzheimer's disease.  There are no indications of significant behavioral disturbance.  However, the patient has had significant medical issues including significant infectious process and metabolic imbalance, obstructive sleep apnea diagnoses in the past and significant  issues with pain and other gait abnormalities likely due to back issues.  While these can  all contribute to changes in cognitive functioning the level of deficits noted today would go well beyond that.  As far as recommendations going forward and will be important to follow the patient over time to assess for progressive changes to more definitively define the patient's diagnostic considerations.  However, the patient is showing deficits that would require more close monitoring by family.  The patient should not be driving given the level of cognitive deficits particularly for visual spatial and visual processing and visual information processing speed.  There likely some significant concerns about performing various ADLs at home including cooking and maintenance of her house around safety issues.  The patient does have regular contact with her daughter and will likely need increasing assistance going forward.  As far as more in-depth recommendations I will sit down with the patient and her daughter on 01/21/2021 and address other specific recommendations.  We will look at reassessment in approximately 9 months to more definitively describe diagnostic considerations.   Diagnosis:                                Major neurocognitive disorder due to Alzheimer's disease, without behavioral disturbance (HCC)   Memory loss   Obstructive sleep apnea of adult     _____________________ Arley Phenix, Psy.D. Clinical Neuropsychologist

## 2021-02-05 ENCOUNTER — Other Ambulatory Visit: Payer: Self-pay | Admitting: Cardiovascular Disease

## 2021-02-05 ENCOUNTER — Other Ambulatory Visit (HOSPITAL_COMMUNITY): Payer: Self-pay

## 2021-02-05 MED ORDER — DILTIAZEM HCL ER COATED BEADS 120 MG PO CP24
120.0000 mg | ORAL_CAPSULE | Freq: Every day | ORAL | 1 refills | Status: DC
  Filled 2021-02-05: qty 30, 30d supply, fill #0
  Filled 2021-03-07: qty 30, 30d supply, fill #1

## 2021-02-12 ENCOUNTER — Other Ambulatory Visit (HOSPITAL_COMMUNITY): Payer: Self-pay

## 2021-02-12 MED ORDER — HYDROCODONE-ACETAMINOPHEN 5-325 MG PO TABS
1.0000 | ORAL_TABLET | Freq: Four times a day (QID) | ORAL | 0 refills | Status: DC | PRN
  Filled 2021-02-12: qty 120, 30d supply, fill #0

## 2021-02-13 ENCOUNTER — Other Ambulatory Visit (HOSPITAL_COMMUNITY): Payer: Self-pay

## 2021-02-19 ENCOUNTER — Other Ambulatory Visit (HOSPITAL_COMMUNITY): Payer: Self-pay

## 2021-02-19 MED ORDER — HYDROXYCHLOROQUINE SULFATE 200 MG PO TABS
300.0000 mg | ORAL_TABLET | Freq: Every day | ORAL | 0 refills | Status: DC
  Filled 2021-02-19: qty 45, 30d supply, fill #0

## 2021-03-04 ENCOUNTER — Other Ambulatory Visit (HOSPITAL_COMMUNITY): Payer: Self-pay

## 2021-03-04 MED ORDER — ESCITALOPRAM OXALATE 20 MG PO TABS
20.0000 mg | ORAL_TABLET | Freq: Every day | ORAL | 3 refills | Status: DC
  Filled 2021-03-04: qty 90, 90d supply, fill #0
  Filled 2021-05-29: qty 90, 90d supply, fill #1
  Filled 2021-09-08: qty 90, 90d supply, fill #2
  Filled 2021-12-01: qty 90, 90d supply, fill #3

## 2021-03-07 ENCOUNTER — Other Ambulatory Visit: Payer: Self-pay | Admitting: Cardiovascular Disease

## 2021-03-07 ENCOUNTER — Other Ambulatory Visit (HOSPITAL_COMMUNITY): Payer: Self-pay

## 2021-03-07 MED ORDER — PROPAFENONE HCL 150 MG PO TABS
ORAL_TABLET | ORAL | 0 refills | Status: DC
Start: 2021-03-07 — End: 2021-07-11
  Filled 2021-03-07: qty 360, 90d supply, fill #0

## 2021-03-08 ENCOUNTER — Other Ambulatory Visit (HOSPITAL_COMMUNITY): Payer: Self-pay

## 2021-03-13 ENCOUNTER — Other Ambulatory Visit (HOSPITAL_COMMUNITY): Payer: Self-pay

## 2021-03-13 MED ORDER — HYDROCODONE-ACETAMINOPHEN 5-325 MG PO TABS
1.0000 | ORAL_TABLET | Freq: Four times a day (QID) | ORAL | 0 refills | Status: DC | PRN
  Filled 2021-03-13: qty 120, 30d supply, fill #0

## 2021-04-01 ENCOUNTER — Other Ambulatory Visit (HOSPITAL_COMMUNITY): Payer: Self-pay

## 2021-04-01 ENCOUNTER — Ambulatory Visit (INDEPENDENT_AMBULATORY_CARE_PROVIDER_SITE_OTHER): Payer: Medicare Other

## 2021-04-01 DIAGNOSIS — I495 Sick sinus syndrome: Secondary | ICD-10-CM

## 2021-04-01 MED ORDER — HYDROXYCHLOROQUINE SULFATE 200 MG PO TABS
300.0000 mg | ORAL_TABLET | Freq: Every day | ORAL | 2 refills | Status: DC
  Filled 2021-04-01: qty 45, 30d supply, fill #0
  Filled 2021-05-04: qty 45, 30d supply, fill #1
  Filled 2021-06-06: qty 45, 30d supply, fill #2

## 2021-04-02 LAB — CUP PACEART REMOTE DEVICE CHECK
Battery Voltage: 2.85 V
Brady Statistic AP VP Percent: 0 %
Brady Statistic AP VS Percent: 97.05 %
Brady Statistic AS VP Percent: 0 %
Brady Statistic AS VS Percent: 2.94 %
Brady Statistic RA Percent Paced: 97.05 %
Brady Statistic RV Percent Paced: 0 %
Date Time Interrogation Session: 20221017143400
Implantable Lead Implant Date: 20121023
Implantable Lead Implant Date: 20121023
Implantable Lead Location: 753859
Implantable Lead Location: 753860
Implantable Pulse Generator Implant Date: 20121023
Lead Channel Impedance Value: 1424 Ohm
Lead Channel Impedance Value: 408 Ohm
Lead Channel Sensing Intrinsic Amplitude: 2.13 mV
Lead Channel Sensing Intrinsic Amplitude: 8.368 mV
Lead Channel Setting Pacing Amplitude: 2 V
Lead Channel Setting Pacing Amplitude: 2.5 V
Lead Channel Setting Pacing Pulse Width: 0.6 ms
Lead Channel Setting Sensing Sensitivity: 0.9 mV

## 2021-04-04 ENCOUNTER — Ambulatory Visit: Payer: Medicare Other | Admitting: Neurology

## 2021-04-09 ENCOUNTER — Other Ambulatory Visit: Payer: Self-pay | Admitting: Cardiovascular Disease

## 2021-04-09 ENCOUNTER — Other Ambulatory Visit (HOSPITAL_COMMUNITY): Payer: Self-pay

## 2021-04-09 MED ORDER — DILTIAZEM HCL ER COATED BEADS 120 MG PO CP24
120.0000 mg | ORAL_CAPSULE | Freq: Every day | ORAL | 1 refills | Status: DC
  Filled 2021-04-09: qty 30, 30d supply, fill #0
  Filled 2021-05-04: qty 30, 30d supply, fill #1

## 2021-04-10 NOTE — Progress Notes (Signed)
Remote pacemaker transmission.   

## 2021-04-22 ENCOUNTER — Other Ambulatory Visit (HOSPITAL_COMMUNITY): Payer: Self-pay

## 2021-04-22 ENCOUNTER — Other Ambulatory Visit: Payer: Self-pay

## 2021-04-22 MED ORDER — HYDROCODONE-ACETAMINOPHEN 5-325 MG PO TABS
1.0000 | ORAL_TABLET | Freq: Four times a day (QID) | ORAL | 0 refills | Status: DC | PRN
  Filled 2021-04-22: qty 120, 30d supply, fill #0

## 2021-04-26 ENCOUNTER — Other Ambulatory Visit: Payer: Self-pay

## 2021-05-04 ENCOUNTER — Other Ambulatory Visit (HOSPITAL_COMMUNITY): Payer: Self-pay

## 2021-05-06 ENCOUNTER — Other Ambulatory Visit (HOSPITAL_COMMUNITY): Payer: Self-pay

## 2021-05-06 MED ORDER — LEVOTHYROXINE SODIUM 112 MCG PO TABS
112.0000 ug | ORAL_TABLET | Freq: Every day | ORAL | 2 refills | Status: DC
  Filled 2021-05-06: qty 90, 90d supply, fill #0
  Filled 2021-08-02: qty 90, 90d supply, fill #1
  Filled 2021-10-30: qty 90, 90d supply, fill #2

## 2021-05-17 ENCOUNTER — Other Ambulatory Visit (HOSPITAL_COMMUNITY): Payer: Self-pay

## 2021-05-19 ENCOUNTER — Other Ambulatory Visit (HOSPITAL_COMMUNITY): Payer: Self-pay

## 2021-05-20 ENCOUNTER — Other Ambulatory Visit (HOSPITAL_COMMUNITY): Payer: Self-pay

## 2021-05-20 ENCOUNTER — Telehealth: Payer: Self-pay | Admitting: Neurology

## 2021-05-20 MED ORDER — DICYCLOMINE HCL 10 MG PO CAPS
10.0000 mg | ORAL_CAPSULE | Freq: Three times a day (TID) | ORAL | 2 refills | Status: DC | PRN
  Filled 2021-05-20: qty 270, 90d supply, fill #0

## 2021-05-20 NOTE — Telephone Encounter (Signed)
LVM for patient to call back because Dr. Frances Furbish will be out sick tomorrow.

## 2021-05-21 ENCOUNTER — Ambulatory Visit: Payer: Medicare Other | Admitting: Neurology

## 2021-05-22 ENCOUNTER — Other Ambulatory Visit (HOSPITAL_COMMUNITY): Payer: Self-pay

## 2021-05-22 MED ORDER — HYDROCODONE-ACETAMINOPHEN 5-325 MG PO TABS
1.0000 | ORAL_TABLET | Freq: Four times a day (QID) | ORAL | 0 refills | Status: DC | PRN
  Filled 2021-05-22: qty 120, 30d supply, fill #0

## 2021-05-22 MED ORDER — CIPROFLOXACIN HCL 500 MG PO TABS
500.0000 mg | ORAL_TABLET | Freq: Two times a day (BID) | ORAL | 0 refills | Status: DC
  Filled 2021-05-22: qty 14, 7d supply, fill #0

## 2021-05-25 ENCOUNTER — Other Ambulatory Visit: Payer: Self-pay | Admitting: Cardiovascular Disease

## 2021-05-27 ENCOUNTER — Other Ambulatory Visit (HOSPITAL_COMMUNITY): Payer: Self-pay

## 2021-05-27 MED ORDER — DIGOXIN 125 MCG PO TABS
0.0625 mg | ORAL_TABLET | Freq: Every day | ORAL | 1 refills | Status: DC
  Filled 2021-05-27: qty 45, 90d supply, fill #0

## 2021-05-29 ENCOUNTER — Other Ambulatory Visit (HOSPITAL_COMMUNITY): Payer: Self-pay

## 2021-06-06 ENCOUNTER — Other Ambulatory Visit: Payer: Self-pay | Admitting: Cardiovascular Disease

## 2021-06-06 ENCOUNTER — Other Ambulatory Visit (HOSPITAL_COMMUNITY): Payer: Self-pay

## 2021-06-06 MED ORDER — DILTIAZEM HCL ER COATED BEADS 120 MG PO CP24
120.0000 mg | ORAL_CAPSULE | Freq: Every day | ORAL | 1 refills | Status: DC
  Filled 2021-06-06: qty 30, 30d supply, fill #0
  Filled 2021-07-11: qty 30, 30d supply, fill #1

## 2021-06-24 ENCOUNTER — Other Ambulatory Visit (HOSPITAL_COMMUNITY): Payer: Self-pay

## 2021-06-24 MED ORDER — HYDROCODONE-ACETAMINOPHEN 5-325 MG PO TABS
1.0000 | ORAL_TABLET | Freq: Four times a day (QID) | ORAL | 0 refills | Status: DC | PRN
  Filled 2021-06-24: qty 120, 30d supply, fill #0

## 2021-07-11 ENCOUNTER — Other Ambulatory Visit (HOSPITAL_COMMUNITY): Payer: Self-pay

## 2021-07-11 ENCOUNTER — Other Ambulatory Visit: Payer: Self-pay | Admitting: Cardiovascular Disease

## 2021-07-11 MED ORDER — PROPAFENONE HCL 150 MG PO TABS
ORAL_TABLET | ORAL | 0 refills | Status: DC
  Filled 2021-07-11: qty 340, 85d supply, fill #0
  Filled 2021-07-12: qty 20, 5d supply, fill #0

## 2021-07-11 MED ORDER — HYDROXYCHLOROQUINE SULFATE 200 MG PO TABS
300.0000 mg | ORAL_TABLET | Freq: Every day | ORAL | 2 refills | Status: DC
  Filled 2021-07-11: qty 45, 30d supply, fill #0
  Filled 2021-08-12: qty 45, 30d supply, fill #1
  Filled 2021-09-27 – 2021-11-28 (×2): qty 45, 30d supply, fill #2

## 2021-07-12 ENCOUNTER — Other Ambulatory Visit (HOSPITAL_COMMUNITY): Payer: Self-pay

## 2021-07-12 MED ORDER — CIPROFLOXACIN HCL 500 MG PO TABS
500.0000 mg | ORAL_TABLET | Freq: Two times a day (BID) | ORAL | 0 refills | Status: AC
  Filled 2021-07-12: qty 10, 5d supply, fill #0

## 2021-07-24 ENCOUNTER — Other Ambulatory Visit (HOSPITAL_COMMUNITY): Payer: Self-pay

## 2021-07-24 MED ORDER — HYDROCODONE-ACETAMINOPHEN 5-325 MG PO TABS
1.0000 | ORAL_TABLET | Freq: Four times a day (QID) | ORAL | 0 refills | Status: DC | PRN
  Filled 2021-07-24: qty 120, 30d supply, fill #0

## 2021-07-25 ENCOUNTER — Ambulatory Visit (INDEPENDENT_AMBULATORY_CARE_PROVIDER_SITE_OTHER): Payer: Medicare Other | Admitting: Neurology

## 2021-07-25 ENCOUNTER — Encounter: Payer: Self-pay | Admitting: Neurology

## 2021-07-25 VITALS — BP 121/54 | HR 62 | Ht 64.0 in | Wt 130.0 lb

## 2021-07-25 DIAGNOSIS — R413 Other amnesia: Secondary | ICD-10-CM | POA: Diagnosis not present

## 2021-07-25 NOTE — Patient Instructions (Signed)
It was nice to see you again today. As discussed, we can consider a medication for your memory loss, but I would like for you to focus on lifestyle modification, in particular, good hydration with water, and avoid drinking alcohol daily. Please keep your follow-up appointment with your primary care physician.  We will follow-up in this clinic as needed.  Would like for you to consider your long-term living situation, assisted living may be an option or a memory care facility could be considered for more structured environment and closer supervision.   I am glad to see that you have a good support system currently.   I would recommend that you talk to your rheumatologist about your narcotic pain medication and the safety of it as you still drink alcohol daily.  Please use your walker at all times for gait safety.

## 2021-07-25 NOTE — Progress Notes (Signed)
Subjective:    Patient ID: Jacqueline Kim is a 76 y.o. female.  HPI    Interim history:   Jacqueline Kim is a 76 year old right-handed woman with an underlying complex medical history of arthritis, degenerative spine disease, hypertension, A. fib, hypothyroidism, history of CHF, reflux disease, depression, glaucoma, anxiety, depression, irritable bowel syndrome, status post pacemaker placement, hyponatremia, gait disorder and recurrent falls, who presents for follow-up consultation of her memory loss.  The patient is accompanied by her daughter today.  I evaluated her in February 2022 at which time she reported an approximately 1 year history of forgetfulness and short-term memory issues.  Her MMSE was 24 out of 30 at the time.  She had recent blood work through her primary care.  She has a pacemaker and I did not order a brain MRI.  She was advised to proceed with neuropsychological evaluation.  She saw Dr. Ilean Skill on 09/10/2020 for initial consultation and had neuropsychological testing on 11/05/2020, and a subsequent follow-up with Dr. Sima Matas on 01/17/2021. I reviewed the impression and recommendations: <<Impression/Diagnosis:                     Overall, the results of the current neuropsychological evaluation taking into account subjective reports by both the patient and her daughter, available medical information including recent CT scan as well as other objective medical information suggest that the patient is displaying significant loss of cognitive functioning and a broad range of areas.  These include functioning typically associated with frontal, temporal and parietal lobe functions.  The patient maintains good verbal skills and verbal knowledge but all other areas assessed during this objective assessment were significantly and in some cases profoundly impaired.  Visual-spatial and visual constructional functioning along with memory deficits were her most profound areas of impairment.  She  also showed significant reduction in visual scanning and visual searching and overall speed of mental operations, auditory and visual encoding deficits etc.  As far as diagnostic consideration the patient has had numerous recent medical issues that likely are playing a significant role in acute exacerbations of some of her symptoms.  Multiple UTIs have been noted as well as sustained issues with significant pain, spinal issues and gait abnormalities.  The patient did not show any significant microvascular ischemic changes/small vessel disease and only mild indications of cortical atrophy although an MRI was not able to be conducted due to the patient having a pacemaker.  The patient denies any tremor, visual hallucination and her gait disturbance could be explained by spinal abnormalities that have been around for some time.  At this point, the patient clearly meets the criterion for dementia and a major neurocognitive disorder with the most likely explanation being relative to Alzheimer's disease.  There are no indications of significant behavioral disturbance.  However, the patient has had significant medical issues including significant infectious process and metabolic imbalance, obstructive sleep apnea diagnoses in the past and significant issues with pain and other gait abnormalities likely due to back issues.  While these can all contribute to changes in cognitive functioning the level of deficits noted today would go well beyond that.  As far as recommendations going forward and will be important to follow the patient over time to assess for progressive changes to more definitively define the patient's diagnostic considerations.  However, the patient is showing deficits that would require more close monitoring by family.  The patient should not be driving given the level of cognitive deficits particularly  for visual spatial and visual processing and visual information processing speed.  There likely some  significant concerns about performing various ADLs at home including cooking and maintenance of her house around safety issues.  The patient does have regular contact with her daughter and will likely need increasing assistance going forward.  As far as more in-depth recommendations I will sit down with the patient and her daughter on 01/21/2021 and address other specific recommendations.  We will look at reassessment in approximately 9 months to more definitively describe diagnostic considerations.   Diagnosis:                                Major neurocognitive disorder due to Alzheimer's disease, without behavioral disturbance ([place])   Memory loss  >>  Today, 07/25/2021: She reports feeling fairly stable, no new issues from her standpoint but daughter feels that there has been a decline in her memory function.  Daughter is concerned about trying any new medication for memory especially in light of patient's daily consumption of alcohol.  Patient does not typically cook, she lives alone but both daughters help out.  Son lives in [place].  He visits on weekends sometimes.  Patient no longer drives.  Daughter reports that a long-term living facility is currently not financially possible.  She has had and interim hospitalization visit since her last appointment in February 2022.  She was evaluated in the emergency room on 08/04/2020 for altered mental status.  She was diagnosed with a urinary tract infection and admitted to the hospital till 08/06/2020.  She was treated with antibiotics.  She had a head CT without contrast on 08/04/2020 and I reviewed the results:   IMPRESSION: No acute intracranial abnormalities are identified.  Previously:   07/26/20: (She) reports short-term memory issues for the past year.  She is forgetful.  She has good long-term memory.  She does not have a telltale family history of dementia but does not know anything about her father's side.  Father died at the age of 84.  Mom had  memory issues later in life, past age 62.  She lived to be 78 years old.  Maternal grandmother also had memory issues also later in life.  Patient quit smoking some 15 years ago and she is working on alcohol reduction.  She has been drinking daily in the past but since our appointment they have been working on reducing her daily alcohol consumption, she drinks a bottle over 3 days.  She has a call alert button.  She has help with showering.  Her balance has improved since she has been in physical therapy, she goes twice a week.  She has 2 half-brothers who have a different father and one of them passed away.  She had a half sister who had another father and also passed away from complications of her lung disease as I understand.  Weight has been stable.  Appetite is not great, she does not tend to have proper meals, likes to eat snacks but likes fresh fruit.  She does not always drink water very well per daughter's report.  They are working on improving water intake.  She had recent issues with UTIs as well.   I saw her over 18 months ago for evaluation of her balance problem and gait disorder.  She had findings concerning for muscular deconditioning, unintentional weight loss, and peripheral neuropathy.  She had undergone extensive blood work already  and she was advised to proceed with EMG/nerve conduction velocity testing, abstain completely from drinking alcohol, and stop driving.  She canceled the EMG and nerve conduction velocity test twice and did not reschedule thereafter. Daughter reports that she ended up having the EMG and nerve conduction velocity test through neurosurgery.  They will get those records for [place].   She has had several interim ER visits including for falls. I reviewed your office records. Note was not included.  She had blood work on 02/28/2020 and I was able to review the results, CBC with differential was unremarkable, CMP showed sodium of 140, glucose 131, BUN 17, creatinine 1.2, AST  61 ALT elevated, look like 199, alk phos normal.  Magnesium 2.0.  TSH 1.13, urinalysis benign.  B12 greater than 2000, RPR nonreactive, folic acid 56.3, HIV screen nonreactive.   She is also scheduled for blood work, could not get it done recently because they had a hard time finding a vein.  They will get the records to [place] once she has had her repeat blood test.   Has had multiple CT scans in the interim.  Head CT, maxillofacial CT and cervical spine CT without contrast on 05/02/2020 showed:  IMPRESSION: CT head:   1. No evidence of acute intracranial abnormality. 2. Mild cerebral atrophy and chronic small vessel ischemic disease.   CT maxillofacial:   1. No acute maxillofacial fracture is identified. 2. Left anterior scalp/forehead soft tissue swelling. 3. Small right maxillary sinus mucous retention cyst. 4. Mild left maxillary sinus mucosal thickening.   CT cervical spine:   1. No evidence of acute fracture to the cervical spine 2. 2 mm C3-C4 grade 1 anterolisthesis, unchanged. 3. 4 mm C4-C5 grade 1 anterolisthesis, unchanged. 4. Cervical spondylosis as described.   CT scan without contrast of her head on 03/05/2020 showed:  IMPRESSION: 1. No acute intracranial abnormalities. 2. Chronic small vessel ischemic disease and brain atrophy.   Head CT without contrast, cervical spine CT without contrast and maxillofacial CT without contrast on 01/20/2019 showed:  IMPRESSION: Atrophy, chronic microvascular disease.   No acute intracranial abnormality.   Cervical spondylosis. Grade 1 anterolisthesis along with disc herniation noted at C4-5. No fracture in the cervical spine or face.     The patient's allergies, current medications, family history, past medical history, past social history, past surgical history and problem list were reviewed and updated as appropriate.      12/27/18: (She) reports a longer standing history of gait and balance problems and recurrent falls, worse in  the past 8 weeks. I reviewed your office note from 11/18/2018.  She has had recurrent falls.  Of note, she is on multiple medications including Eliquis, diltiazem, furosemide, Plaquenil, trazodone, midodrine, hydrocodone, Voltaren, atenolol, propafenone.  She had blood work through your office, CRP was elevated at 35, magnesium was 1.6 which is slightly low, aldolase was within limits, CMP showed sodium of 129, AST of 54, ALT 39, CK level was 200, rheumatoid factor 25.7, ESR 13, TSH 1.31, ANA negative, CCP antibodies negative she has been followed by orthopedics and neurosurgery.  She has had progressive lower extremity weakness.  There has been concern that the patient is drinking alcohol excessively, up to 1-1/2 L of wine per day.  She has been using a walker.  She is status post left knee replacement surgery.  She has been seeing Dr. Brien Few for pain management.  She had an EMG and nerve conduction study through [facility].  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   She had a CT lumbar spine without contrast on 08/20/2017 due to progressive lower extremity weakness and pain reported, this was ordered by Dr. Nelva Bush, and I reviewed the results: IMPRESSION: Severe convex right scoliosis.   Moderate to moderately severe central canal and bilateral subarticular recess narrowing at L3-4 where there is a disc bulge and advanced bilateral facet degenerative change. Mild left foraminal narrowing is also present at this level.   Moderate to moderately severe central canal stenosis at L4-5. The foramina appear open.   Partial autologous fusion across the disc interspace at L1-2.   Disc and endplate spur at K5-9 cause narrowing in the left lateral recess which could impact the descending left L3 root.   She had a CT cervical spine and lumbar spine CT with contrast on 10/09/2017 as ordered by Dr. Sherley Bounds and I reviewed the results: IMPRESSION: 1. Chronic degenerative  disc disease and disc osteophyte complex in the cervical spine. 2. Moderate to severe left foraminal stenosis at C3-4. 3. Moderate central and bilateral foraminal stenosis at C4-5 and C5-6. 4. Grade 1 anterolisthesis at C4-5 with a prominent soft disc protrusion. 5. Mild central and bilateral foraminal narrowing at C6-7. 6. Lumbar scoliosis. 7. Mild left subarticular narrowing at L2-3. 8. Moderate right and mild left subarticular and foraminal stenosis is present at L3-4. 9. Mild subarticular and foraminal narrowing bilaterally at L4-5.   She had a head CT without contrast on 05/15/2018 after a fall and I reviewed the results: MPRESSION: 1. No acute intracranial abnormalities. 2. Right frontal scalp hematoma.  No skull fracture.   Per daughter, she has lost quite a bit of weight, she moved from a apartment to a ground-floor apartment last year.  Patient reports that she had lost her appetite, she has weighed 132 pounds at your office on 11/18/2018.  She had repeat labs through your office on 12/16/2018 and per daughter her sodium level and liver enzyme was better.  She fell about 3 to 4 weeks ago, she has been losing weight over the past 6 months at least.  She admits to drinking heavily for several years, she has a family history of alcoholism on her mother's side, she admits that she had been drinking wine excessively.  She reports that she has quit drinking alcohol about 2 weeks ago.  She lives alone.  She has 3 children, her 2 daughters live about 5 minutes away, her son lives in [place].  She had left knee replacement surgery in 2016.  She sees rheumatology, Dr. Amil Amen.  She quit smoking some 20 years ago.  She is in physical therapy currently, goes twice a week.  She has a rolling walker with seat. She denies a family history of neuropathy but reports a family history of arthritis.    Her Past Medical History Is Significant For: Past Medical History:  Diagnosis Date   Anticoagulant  long-term use  WITH PRADAXA 05/27/2011   Anxiety    Cellulitis of buttock, left 93/57/0177   Complication of anesthesia    DIFFICULTY WAKING UP   Depression    Diverticulosis    DJD (degenerative joint disease)    Esophagitis 09/17/06   External hemorrhoids    GERD (gastroesophageal reflux disease)    Glaucoma    History of stress test    a. 2008 - normal nuc.   Hypertension    Hypothyroid    IBS (irritable bowel syndrome)    Paroxysmal A-fib ([facility])  diagnosed 2008   a. On propafenone, Pradaxa.   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   s/p MDT PPM by Dr Recardo Evangelist    Her Past Surgical History Is Significant For: Past Surgical History:  Procedure Laterality Date   BREAST REDUCTION SURGERY     BUNIONECTOMY Right 07/09/2015   with rods and pins   CARDIOVERSION N/A 08/02/2014   Procedure: CARDIOVERSION;  Surgeon: Sueanne Margarita, MD;  Location: [facility];  Service: Cardiovascular;  Laterality: N/A;   CATARACT EXTRACTION, BILATERAL Bilateral    FACIAL COSMETIC SURGERY     FOOT SURGERY Left    HAND SURGERY     multiple   LIPOSUCTION     NM MYOCAR PERF WALL MOTION  12/29/2006   No significant ischemia; EF 69%   OVARIAN CYST REMOVAL     PACEMAKER INSERTION  04/08/11   MDT Revo implanted by Dr Recardo Evangelist   TONSILLECTOMY     TOTAL KNEE ARTHROPLASTY Left 05/01/2015   Procedure: TOTAL LEFT KNEE ARTHROPLASTY;  Surgeon: Paralee Cancel, MD;  Location: [facility];  Service: Orthopedics;  Laterality: Left;   VAGINAL HYSTERECTOMY      Her Family History Is Significant For: Family History  Problem Relation Age of Onset   Anesthesia problems Mother    Colon polyps Mother        beign   Colon cancer Neg Hx    Dementia Neg Hx     Her Social History Is Significant For: Social History   Socioeconomic History   Marital status: Divorced    Spouse name: Not on file   Number of children: 3   Years of education: Not on file    Highest education level: Not on file  Occupational History   Occupation: retired    Fish farm manager: RETIRED  Tobacco Use   Smoking status: Former    Types: Cigarettes    Quit date: 08/30/1996    Years since quitting: 24.9   Smokeless tobacco: Never  Substance and Sexual Activity   Alcohol use: Yes    Alcohol/week: 14.0 standard drinks    Types: 14 Glasses of wine per week    Comment: daily   Drug use: No   Sexual activity: Yes  Other Topics Concern   Not on file  Social History Narrative   Lives in [place],  Former Engineer, structural,  Separated.     Social Determinants of Health   Financial Resource Strain: Not on file  Food Insecurity: Not on file  Transportation Needs: Not on file  Physical Activity: Not on file  Stress: Not on file  Social Connections: Not on file    Her Allergies Are:  Allergies  Allergen Reactions   Azithromycin Other (See Comments)    Causes heart rhythm issues Other reaction(s): Other (See Comments) Causes heart rhythm issues   Epinephrine Other (See Comments)    Causes A-Fib Other reaction(s): Increased Heart Rate (intolerance) States that it caused her heart to beat fast, so she will not take it. Causes A-Fib A-fib Triggers atrial fib    Penicillins Anaphylaxis    Has patient had a PCN reaction causing immediate rash, facial/tongue/throat swelling, SOB or lightheadedness with hypotension: Yes Has patient had a PCN reaction causing severe rash involving mucus membranes or skin necrosis: No Has patient had a PCN reaction that required hospitalization No Has patient had a PCN reaction occurring within the last 10 years: No If all of the above  answers are "NO", then may proceed with Cephalosporin use.    Lidocaine Palpitations    Or any similar medication Other reaction(s): Other (See Comments) Or any similar medication   Metoprolol Other (See Comments)    alopecia Other reaction(s): Other (See Comments) alopecia  :   Her  Current Medications Are:  Outpatient Encounter Medications as of 07/25/2021  Medication Sig   atenolol (TENORMIN) 50 MG tablet Take 50 mg by mouth daily as needed (afib).   clobetasol (TEMOVATE) 0.05 % external solution Apply 1 application topically to affected area(s) 2 (two) times daily as needed. Do not apply to face, groin, or armpit.   diclofenac Sodium (VOLTAREN) 1 % GEL Apply 2 g topically daily as needed (pain).   dicyclomine (BENTYL) 10 MG capsule Take 10 mg by mouth 2 (two) times daily.   digoxin (LANOXIN) 0.125 MG tablet Take 0.5 tablets (0.0625 mg total) by mouth daily.   diltiazem (CARTIA XT) 120 MG 24 hr capsule Take 1 capsule (120 mg total) by mouth at bedtime. Keep upcoming appointment for future refills.   escitalopram (LEXAPRO) 20 MG tablet Take 1 tablet (20 mg total) by mouth daily.   HYDROcodone-acetaminophen (NORCO/VICODIN) 5-325 MG tablet Take 1 tablet by mouth every 6 (six) hours as needed.   hydrocortisone (ANUSOL-HC) 2.5 % rectal cream Place 1 application rectally daily as needed for hemorrhoids or anal itching.   hydroxychloroquine (PLAQUENIL) 200 MG tablet Take 1.5 tablets (300 mg total) by mouth daily with food or milk   levothyroxine (SYNTHROID) 112 MCG tablet Take 1 tablet (112 mcg total) by mouth daily.   propafenone (RYTHMOL) 150 MG tablet Take 1.5 tablets (225 mg total) by mouth every morning AND 1 tablet (150 mg total) daily in the mid-day, AND 1.5 tablets (225 mg total) at bedtime.   traZODone (DESYREL) 150 MG tablet TAKE 1/2 TABLET BY MOUTH ONCE A DAY IN THE EVENING   triamcinolone cream (KENALOG) 0.1 % Apply 1 application Twice a day   triamcinolone cream (KENALOG) 0.1 % Apply 1 application to affected area(s) up to 2 (two) times daily as needed. Do not apply to face, groin, or armpit.   dicyclomine (BENTYL) 10 MG capsule TAKE 1 CAPSULE BY MOUTH THREE TIMES A DAY AS NEEDED 90   dicyclomine (BENTYL) 10 MG capsule Take 1 capsule (10 mg total) by mouth 3 (three)  times daily as needed.   escitalopram (LEXAPRO) 20 MG tablet Take 20 mg by mouth daily.   HYDROcodone-acetaminophen (NORCO/VICODIN) 5-325 MG tablet Take 1 tablet by mouth every 6 (six) hours as needed.   HYDROcodone-acetaminophen (NORCO/VICODIN) 5-325 MG tablet Take 1 tablet by mouth every 6 (six) hours as needed.   HYDROcodone-acetaminophen (NORCO/VICODIN) 5-325 MG tablet Take 1 tablet by mouth every 6 (six) hours as needed.   levothyroxine (SYNTHROID, LEVOTHROID) 112 MCG tablet Take 112 mcg by mouth daily before breakfast.    No facility-administered encounter medications on file as of 07/25/2021.  :  Review of Systems:  Out of a complete 14 point review of systems, all are reviewed and negative with the exception of these symptoms as listed below:  Review of Systems  Neurological:        Pt is here for follow with memory . Had a neuro psych  testing in August 2022 .  Pt is here for follow up    Objective:  Neurological Exam  Physical Exam Physical Examination:   Vitals:   07/25/21 1435  BP: (!) 121/54  Pulse: 62  General Examination: The patient is a very pleasant 76 y.o. female in no acute distress. She appears thin and frail.  Well-groomed.  In clinic wheelchair.  HEENT: Normocephalic, atraumatic, pupils are equal, round and reactive to light, tracking is preserved.  Face is symmetric, normal facial animation, no hypophonia, able to provide some of her history, no carotid bruits.  Stable HEENT exam.  Neck is supple.   Chest: Clear to auscultation without wheezing, rhonchi or crackles noted.   Heart: S1+S2+0, regular and normal without murmurs, rubs or gallops noted.   Abdomen: Soft, non-tender and non-distended.    Extremities: There is no obvious edema in the distal lower extremities bilaterally.   Skin: Warm and dry without trophic changes noted.   Musculoskeletal: exam reveals no obvious joint deformities.     Neurologically:  Mental status: The patient is awake,  attention providing very little history, history is primarily provided by her daughter.    MMSE - Mini Mental State Exam 07/25/2021 07/26/2020  Orientation to time 2 3  Orientation to Place 4 5  Registration 3 3  Attention/ Calculation 1 5  Recall 1 0  Language- name 2 objects 2 2  Language- repeat 0 1  Language- follow 3 step command 2 3  Language- read & follow direction 1 1  Write a sentence 0 1  Copy design 0 0  Total score 16 24     On 07/26/2020: CDT: 1/4, AFT: 5/min.  On 07/25/2021: CDT: 2/4, AFT: 6/min.   Cranial nerves II - XII are as described above under HEENT exam.  Motor exam: think bulk with global muscle thinning, no focal atrophy, no fasciculations noted. Tone is normal, global strength of approximately 4 out of 5.  I did not have her stand or walk for me today.  [place] is not tested for safety concerns.  She is in a wheelchair.   Cerebellar testing: No dysmetria or intention tremor.  Sensory exam:  Intact to light touch.     Assessment and Plan:    In summary, Jacqueline Kim is a very pleasant 76 year old female with an underlying complex medical history of arthritis, degenerative spine disease, hypertension, A. fib, hypothyroidism, history of CHF, reflux disease, depression, glaucoma, anxiety, depression, irritable bowel syndrome, status post pacemaker placement, hyponatremia, gait disorder and recurrent falls, who presents for follow-up consultation of her memory loss.   Memory symptoms have been ongoing and progressive for the past nearly 2 years.  Neuropsychological evaluation in 2022 was supportive of an underlying major neurocognitive disorder, complicated by her complex medical history and recent setbacks such as UTIs.  She no longer drives.  We talked about supportive care and medication management today.  She drinks alcohol every day and is currently not in favor of stopping.  She takes narcotic pain medication which can affect her memory function as well.  I  suggested we could consider memory medication such as donepezil but I would really encourage her to stop drinking alcohol on a daily basis for safety reasons.  We talked about the importance of supervision.  I would like for the family to discuss the possibility of long-term care such as a memory care facility or assisted living facility.  Financial restrictions are of course part of the consideration.  She does have a supportive family.  She has close follow-up with primary care.  I suggested they keep the follow-up with the primary care for now.  She is encouraged to stay well-hydrated with water and avoid  drinking wine every day.  She drinks about 2 glasses of wine per day.  Her daughter indicated separately in writing to me that they dilute her wine.  At this time follow-up with her specialists and primary care.  She is pending blood work, recent urinalysis was negative for a recurrent urinary tract infection.  I answered all the questions today and the patient and her daughter were in agreement. I spent 30 minutes in total face-to-face time and in reviewing records during pre-charting, more than 50% of which was spent in counseling and coordination of care, reviewing test results, reviewing medications and treatment regimen and/or in discussing or reviewing the diagnosis of dementia, the prognosis and treatment options. Pertinent laboratory and imaging test results that were available during this visit with the patient were reviewed by me and considered in my medical decision making (see chart for details).

## 2021-08-02 ENCOUNTER — Other Ambulatory Visit (HOSPITAL_COMMUNITY): Payer: Self-pay

## 2021-08-12 ENCOUNTER — Other Ambulatory Visit: Payer: Self-pay | Admitting: Cardiovascular Disease

## 2021-08-12 ENCOUNTER — Other Ambulatory Visit (HOSPITAL_COMMUNITY): Payer: Self-pay

## 2021-08-12 MED ORDER — NITROFURANTOIN MACROCRYSTAL 50 MG PO CAPS
50.0000 mg | ORAL_CAPSULE | Freq: Every day | ORAL | 6 refills | Status: DC
  Filled 2021-08-12: qty 30, 30d supply, fill #0
  Filled 2021-09-27: qty 30, 30d supply, fill #1
  Filled 2021-10-24: qty 30, 30d supply, fill #2
  Filled 2021-11-28: qty 30, 30d supply, fill #3
  Filled 2021-12-22: qty 30, 30d supply, fill #4
  Filled 2022-01-20: qty 30, 30d supply, fill #5
  Filled 2022-02-22: qty 30, 30d supply, fill #6

## 2021-08-13 ENCOUNTER — Other Ambulatory Visit: Payer: Self-pay

## 2021-08-13 ENCOUNTER — Ambulatory Visit (INDEPENDENT_AMBULATORY_CARE_PROVIDER_SITE_OTHER): Payer: Medicare Other | Admitting: Podiatry

## 2021-08-13 ENCOUNTER — Ambulatory Visit: Payer: Medicare Other | Admitting: Neurology

## 2021-08-13 ENCOUNTER — Encounter: Payer: Self-pay | Admitting: Podiatry

## 2021-08-13 ENCOUNTER — Other Ambulatory Visit (HOSPITAL_COMMUNITY): Payer: Self-pay

## 2021-08-13 DIAGNOSIS — L600 Ingrowing nail: Secondary | ICD-10-CM

## 2021-08-13 MED ORDER — DILTIAZEM HCL ER COATED BEADS 120 MG PO CP24
120.0000 mg | ORAL_CAPSULE | Freq: Every day | ORAL | 1 refills | Status: DC
  Filled 2021-08-13: qty 30, 30d supply, fill #0
  Filled 2021-09-08: qty 30, 30d supply, fill #1

## 2021-08-13 MED ORDER — NEOMYCIN-POLYMYXIN-HC 1 % OT SOLN
Freq: Two times a day (BID) | OTIC | 0 refills | Status: DC
  Filled 2021-08-13: qty 10, 30d supply, fill #0

## 2021-08-13 NOTE — Patient Instructions (Signed)

## 2021-08-13 NOTE — Progress Notes (Signed)
°  Subjective:  Patient ID: Jacqueline Kim, female    DOB: [birth year],  MRN: [id number]  Chief Complaint  Patient presents with   Nail Problem      NP Right Great Toe ingrown nail - oozing - not diabetic    76 y.o. female presents with the above complaint. History confirmed with patient.  Here with her daughter who confirms the history, she has had the left side treated before but the right side is a new issue  Objective:  Physical Exam: warm, good capillary refill, no trophic changes or ulcerative lesions, normal DP and PT pulses, normal sensory exam, and right hallux ingrown nail lateral border and medial painful as well Assessment:   1. Ingrowing right great toenail      Plan:  Patient was evaluated and treated and all questions answered.    Ingrown Nail, right -Patient elects to proceed with minor surgery to remove ingrown toenail today. Consent reviewed and signed by patient. -Ingrown nail excised. See procedure note. -Educated on post-procedure care including soaking. Written instructions provided and reviewed. -Patient to follow up in 2 weeks for nail check.  Procedure: Excision of Ingrown Toenail Location: Right 1st toe medial and lateral nail borders. Anesthesia: Lidocaine 1% plain; 1.5 mL and Marcaine 0.5% plain; 1.5 mL, digital block. Skin Prep: Betadine. Dressing: Silvadene; telfa; dry, sterile, compression dressing. Technique: Following skin prep, the toe was exsanguinated and a tourniquet was secured at the base of the toe. The affected nail border was freed, split with a nail splitter, and excised. Chemical matrixectomy was then performed with phenol and irrigated out with alcohol. The tourniquet was then removed and sterile dressing applied. Disposition: Patient tolerated procedure well. Patient to return in 2 weeks for follow-up.    Return in about 2 weeks (around 08/27/2021) for nail re-check, right nail check.

## 2021-08-21 ENCOUNTER — Other Ambulatory Visit (HOSPITAL_COMMUNITY): Payer: Self-pay

## 2021-09-03 ENCOUNTER — Ambulatory Visit: Payer: Medicare Other | Admitting: Podiatry

## 2021-09-05 ENCOUNTER — Other Ambulatory Visit (HOSPITAL_COMMUNITY): Payer: Self-pay

## 2021-09-05 MED ORDER — HYDROXYCHLOROQUINE SULFATE 200 MG PO TABS
200.0000 mg | ORAL_TABLET | Freq: Every day | ORAL | 3 refills | Status: DC
  Filled 2021-09-05: qty 90, 90d supply, fill #0

## 2021-09-05 MED ORDER — HYDROCODONE-ACETAMINOPHEN 5-325 MG PO TABS
1.0000 | ORAL_TABLET | Freq: Four times a day (QID) | ORAL | 0 refills | Status: DC | PRN
  Filled 2021-09-05: qty 120, 30d supply, fill #0

## 2021-09-09 ENCOUNTER — Other Ambulatory Visit (HOSPITAL_COMMUNITY): Payer: Self-pay

## 2021-09-10 ENCOUNTER — Telehealth: Payer: Self-pay

## 2021-09-10 NOTE — Telephone Encounter (Signed)
Called and spoke with daughter Tresa EndoKelly who is DPR on file.  She is agreeable to appt 09/11/21 with [facility]. ?

## 2021-09-10 NOTE — Telephone Encounter (Signed)
Remote transmission reviewed. AF burden has increased with RVR at times. Spoke to patients daughter Jacqueline Kim who is on DPR and states patient has been very fatigued and not able to do much without having to lay back down. Patient hx dementia per daughter and could possibly have medication mixed up due to a machine that dispenses medication is not working properly.  States the app on [facility] phone says it dispensed the medication but patient says it did not. Again, patient has dementia so daughter is not 100% she stated. States patient has a new one ordered. Patient denies shortness of breath or palpitations. Advised recommended AF clinic considering increase in AF burden with RVR and current symptoms. Agreeable to plan. Advised I will forward to AF clinic and someone will follow up. Agreeable to plan.  ? ? ? ? ? ? ? ? ?

## 2021-09-10 NOTE — Telephone Encounter (Signed)
Patient daughter called in wanting to know if a nurse can give her a call back about her mother transmission that was sent in today she is not feeling well, she did not specify what was wrong  ?

## 2021-09-11 ENCOUNTER — Other Ambulatory Visit: Payer: Self-pay

## 2021-09-11 ENCOUNTER — Other Ambulatory Visit (HOSPITAL_COMMUNITY): Payer: Self-pay

## 2021-09-11 ENCOUNTER — Ambulatory Visit (HOSPITAL_COMMUNITY)
Admission: RE | Admit: 2021-09-11 | Discharge: 2021-09-11 | Disposition: A | Discharge status: Home / Self Care | Payer: Medicare Other | Source: Ambulatory Visit | Attending: Nurse Practitioner | Admitting: Nurse Practitioner

## 2021-09-11 ENCOUNTER — Encounter (HOSPITAL_COMMUNITY): Payer: Self-pay | Admitting: Nurse Practitioner

## 2021-09-11 ENCOUNTER — Ambulatory Visit (INDEPENDENT_AMBULATORY_CARE_PROVIDER_SITE_OTHER): Payer: Medicare Other

## 2021-09-11 ENCOUNTER — Telehealth: Payer: Self-pay | Admitting: Cardiovascular Disease

## 2021-09-11 VITALS — BP 112/78 | HR 98 | Ht 64.0 in | Wt 121.8 lb

## 2021-09-11 DIAGNOSIS — I495 Sick sinus syndrome: Secondary | ICD-10-CM

## 2021-09-11 DIAGNOSIS — I10 Essential (primary) hypertension: Secondary | ICD-10-CM | POA: Insufficient documentation

## 2021-09-11 DIAGNOSIS — I4819 Other persistent atrial fibrillation: Secondary | ICD-10-CM | POA: Diagnosis present

## 2021-09-11 DIAGNOSIS — Z79899 Other long term (current) drug therapy: Secondary | ICD-10-CM | POA: Diagnosis not present

## 2021-09-11 DIAGNOSIS — F0390 Unspecified dementia without behavioral disturbance: Secondary | ICD-10-CM | POA: Insufficient documentation

## 2021-09-11 DIAGNOSIS — Z87891 Personal history of nicotine dependence: Secondary | ICD-10-CM | POA: Diagnosis not present

## 2021-09-11 LAB — CUP PACEART REMOTE DEVICE CHECK
Battery Voltage: 2.82 V
Brady Statistic AP VP Percent: 0.01 %
Brady Statistic AP VS Percent: 94.11 %
Brady Statistic AS VP Percent: 0.02 %
Brady Statistic AS VS Percent: 5.87 %
Brady Statistic RA Percent Paced: 93.31 %
Brady Statistic RV Percent Paced: 0.03 %
Date Time Interrogation Session: 20230328103951
Implantable Lead Implant Date: 20121023
Implantable Lead Implant Date: 20121023
Implantable Lead Location: 753859
Implantable Lead Location: 753860
Implantable Pulse Generator Implant Date: 20121023
Lead Channel Impedance Value: 1568 Ohm
Lead Channel Impedance Value: 440 Ohm
Lead Channel Sensing Intrinsic Amplitude: 1.13 mV
Lead Channel Sensing Intrinsic Amplitude: 13.724 mV
Lead Channel Setting Pacing Amplitude: 2 V
Lead Channel Setting Pacing Amplitude: 2.5 V
Lead Channel Setting Pacing Pulse Width: 0.6 ms
Lead Channel Setting Sensing Sensitivity: 0.9 mV

## 2021-09-11 MED ORDER — DILTIAZEM HCL 30 MG PO TABS
30.0000 mg | ORAL_TABLET | Freq: Two times a day (BID) | ORAL | 3 refills | Status: DC
  Filled 2021-09-11: qty 60, 30d supply, fill #0

## 2021-09-11 NOTE — Telephone Encounter (Signed)
Spoke with [name], pt's daughter. She states she is just worried "because we have never done it this way". It was explained to her that her mother being admitted each time she has an episode is not the most conducive to her and taking medications at home could keep her out of the hospital. She verbalized understanding. She states they started the Cardizem 30 today at 3 pm. She is made aware that I will get this message over to Dr. [name] for review.  ?

## 2021-09-11 NOTE — Patient Instructions (Signed)
Add - Cardizem 30mg  - at 830am and 3pm (check blood pressure prior to dose to ensure top number is over 100) ?

## 2021-09-11 NOTE — Telephone Encounter (Signed)
Pt c/o medication issue: ? ?1. Name of Medication: diltiazem (CARDIZEM) 30 MG tablet ? ?2. How are you currently taking this medication (dosage and times per day)? Took it at 3 pm for the first time today ? ?3. Are you having a reaction (difficulty breathing--STAT)? no ? ?4. What is your medication issue? Patient's daughter calling to discuss the patient's cardizem that was prescribed at the afib clinic. She says previously the patient was admitted to the hospital and the cardizem was done as a drip. She says at the afib clinic they were told the patient did not need that. She says she felt "blown off" at the appointment and is frustrated and nervous for the patient.   ?

## 2021-09-11 NOTE — Progress Notes (Signed)
? ?Primary Care Physician: Georgianne Fick, MD ?Referring Physician: Dr. [name] ? ? ?Jacqueline Kim is a 76 y.o. female with a h/o paroxysmal afib, dementia, HTN that is in the afib clinic as she noted on going afib for around 4 days. She is not on anticoagulation re previous falls. She has not been able to tolerate a lot of rate control for hypotension. She is on propafenone.  She has not felt well for 4 days. She is in rate controlled afib. Daughter states that she was not able to give blood at PCP office last month. She is very pale. Tried numerous times here to get labs and was unsuccessful. She does not appear unstable. Pt states that she has recently lost around 6 lbs as she has had poor appetite.  ? ?Today, she denies symptoms of palpitations, chest pain, shortness of breath, orthopnea, PND, lower extremity edema, dizziness, presyncope, syncope, or neurologic sequela. The patient is tolerating medications without difficulties and is otherwise without complaint today.  ? ?Past Medical History:  ?Diagnosis Date  ? Anticoagulant long-term use  WITH PRADAXA 05/27/2011  ? Anxiety   ? Cellulitis of buttock, left 12/20/2014  ? Complication of anesthesia   ? DIFFICULTY WAKING UP  ? Depression   ? Diverticulosis   ? DJD (degenerative joint disease)   ? Esophagitis 09/17/06  ? External hemorrhoids   ? GERD (gastroesophageal reflux disease)   ? Glaucoma   ? History of stress test   ? a. 2008 - normal nuc.  ? Hypertension   ? Hypothyroid   ? IBS (irritable bowel syndrome)   ? Paroxysmal A-fib [facility]) diagnosed 2008  ? a. On propafenone, Pradaxa.  ? 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 s/p MDT PPM by Dr Rubie Maid  ? ?Past Surgical History:  ?Procedure Laterality Date  ? BREAST REDUCTION SURGERY    ? BUNIONECTOMY Right 07/09/2015  ? with rods and pins  ? CARDIOVERSION N/A 08/02/2014  ? Procedure: CARDIOVERSION;  Surgeon: Quintella Reichert, MD;  [facility]  [facility];  Service: Cardiovascular;  Laterality: N/A;  ? CATARACT EXTRACTION, BILATERAL Bilateral   ? FACIAL COSMETIC SURGERY    ? FOOT SURGERY Left   ? HAND SURGERY    ? multiple  ? LIPOSUCTION    ? NM MYOCAR PERF WALL MOTION  12/29/2006  ? No significant ischemia; EF 69%  ? OVARIAN CYST REMOVAL    ? PACEMAKER INSERTION  04/08/11  ? MDT Revo implanted by Dr Rubie Maid  ? TONSILLECTOMY    ? TOTAL KNEE ARTHROPLASTY Left 05/01/2015  ? Procedure: TOTAL LEFT KNEE ARTHROPLASTY;  Surgeon: Durene Romans, MD;  Location: [facility] ORS;  Service: Orthopedics;  Laterality: Left;  ? VAGINAL HYSTERECTOMY    ? ? ?Current Outpatient Medications  ?Medication Sig Dispense Refill  ? diclofenac Sodium (VOLTAREN) 1 % GEL Apply 2 g topically daily as needed (pain).    ? dicyclomine (BENTYL) 10 MG capsule Take 10 mg by mouth 2 (two) times daily.    ? digoxin (LANOXIN) 0.125 MG tablet Take 0.5 tablets (0.0625 mg total) by mouth daily. 45 tablet 1  ? diltiazem (CARDIZEM) 30 MG tablet Take 1 tablet (30 mg total) by mouth 2 (two) times daily at 8 am and 3 pm. 60 tablet 3  ? diltiazem (CARTIA XT) 120 MG 24 hr capsule Take 1 capsule (120 mg total) by mouth at bedtime. Keep upcoming appointment for future refills. 30 capsule  1  ? escitalopram (LEXAPRO) 20 MG tablet Take 1 tablet (20 mg total) by mouth daily. 90 tablet 3  ? HYDROcodone-acetaminophen (NORCO/VICODIN) 5-325 MG tablet Take 1 tablet by mouth every 6 (six) hours as needed. 120 tablet 0  ? hydrocortisone (ANUSOL-HC) 2.5 % rectal cream Place 1 application rectally daily as needed for hemorrhoids or anal itching.    ? hydroxychloroquine (PLAQUENIL) 200 MG tablet Take 1.5 tablets (300 mg total) by mouth daily with food or milk 45 tablet 2  ? levothyroxine (SYNTHROID) 112 MCG tablet Take 1 tablet (112 mcg total) by mouth daily. 90 tablet 2  ? nitrofurantoin (MACRODANTIN) 50 MG capsule Take 1 capsule (50 mg total) by mouth daily. 30 capsule 6  ? propafenone (RYTHMOL) 150 MG tablet Take 1.5 tablets  (225 mg total) by mouth every morning AND 1 tablet (150 mg total) daily in the mid-day, AND 1.5 tablets (225 mg total) at bedtime. 360 tablet 0  ? traZODone (DESYREL) 150 MG tablet TAKE 1/2 TABLET BY MOUTH ONCE A DAY IN THE EVENING 30 tablet 6  ? triamcinolone cream (KENALOG) 0.1 % Apply 1 application to affected area(s) up to 2 (two) times daily as needed. Do not apply to face, groin, or armpit. 454 g 3  ? ?No current facility-administered medications for this encounter.  ? ? ?Allergies  ?Allergen Reactions  ? Azithromycin Other (See Comments)  ?  Causes heart rhythm issues ?Other reaction(s): Other (See Comments) ?Causes heart rhythm issues  ? Epinephrine Other (See Comments)  ?  Causes A-Fib ?Other reaction(s): Increased Heart Rate (intolerance) ?States that it caused her heart to beat fast, so she will not take it. ?Causes A-Fib ?A-fib ?Triggers atrial fib ?  ? Penicillins Anaphylaxis  ?  Has patient had a PCN reaction causing immediate rash, facial/tongue/throat swelling, SOB or lightheadedness with hypotension: Yes ?Has patient had a PCN reaction causing severe rash involving mucus membranes or skin necrosis: No ?Has patient had a PCN reaction that required hospitalization No ?Has patient had a PCN reaction occurring within the last 10 years: No ?If all of the above answers are "NO", then may proceed with Cephalosporin use. ?  ? Lidocaine Palpitations  ?  Or any similar medication ?Other reaction(s): Other (See Comments) ?Or any similar medication  ? Metoprolol Other (See Comments)  ?  alopecia ?Other reaction(s): Other (See Comments) ?alopecia  ? ? ?Social History  ? ?Socioeconomic History  ? Marital status: Divorced  ?  Spouse name: Not on file  ? Number of children: 3  ? Years of education: Not on file  ? Highest education level: Not on file  ?Occupational History  ? Occupation: retired  ?  Employer: RETIRED  ?Tobacco Use  ? Smoking status: Former  ?  Types: Cigarettes  ?  Quit date: 08/30/1996  ?  Years  since quitting: 25.0  ? Smokeless tobacco: Never  ?Substance and Sexual Activity  ? Alcohol use: Yes  ?  Alcohol/week: 14.0 standard drinks  ?  Types: 14 Glasses of wine per week  ?  Comment: daily  ? Drug use: No  ? Sexual activity: Yes  ?Other Topics Concern  ? Not on file  ?Social History Narrative  ? Lives in [place],  Former Real [employer],  Separated.    ? ?Social Determinants of Health  ? ?Financial Resource Strain: Not on file  ?Food Insecurity: Not on file  ?Transportation Needs: Not on file  ?Physical Activity: Not on file  ?Stress: Not on  file  ?Social Connections: Not on file  ?Intimate Partner Violence: Not on file  ? ? ?Family History  ?Problem Relation Age of Onset  ? Anesthesia problems Mother   ? Colon polyps Mother   ?     beign  ? Colon cancer Neg Hx   ? Dementia Neg Hx   ? ? ?ROS- All systems are reviewed and negative except as per the HPI above ? ?Physical Exam: ?Vitals:  ? 09/11/21 0937  ?BP: 112/78  ?Pulse: 98  ?Weight: 55.2 kg  ?Height:  (1.626 m)  ? ?Wt Readings from Last 3 Encounters:  ?09/11/21 55.2 kg  ?07/25/21 59 kg  ?11/19/20 60.1 kg  ? ? ?Labs: ?Lab Results  ?Component Value Date  ? NA 136 08/06/2020  ? K 3.2 (L) 08/06/2020  ? CL 105 08/06/2020  ? CO2 21 (L) 08/06/2020  ? GLUCOSE 103 (H) 08/06/2020  ? BUN 18 08/06/2020  ? CREATININE 0.78 08/06/2020  ? CALCIUM 8.6 (L) 08/06/2020  ? PHOS 3.0 01/26/2019  ? MG 2.0 08/04/2020  ? ?Lab Results  ?Component Value Date  ? INR 1.4 (H) 01/20/2019  ? ?Lab Results  ?Component Value Date  ? CHOL  12/19/2006  ?  172        ?ATP III CLASSIFICATION: ? <200     mg/dL   Desirable ? 811-914  mg/dL   Borderline High ? >=782    mg/dL   High  ? HDL 37 (L) 12/19/2006  ? LDLCALC  12/19/2006  ?  76        ?Total Cholesterol/HDL:CHD Risk ?Coronary Heart Disease Risk Table ?                    Men   Women ? 1/2 Average Risk   3.4   3.3  ? TRIG 293 (H) 12/19/2006  ? ? ? ?GEN- The patient is elderly appearing, pale but in no acute distress ?Head-  normocephalic, atraumatic ?Eyes-  Sclera clear, conjunctiva pink ?Ears- hearing intact ?Oropharynx- clear ?Neck- supple, no JVP ?Lymph- no cervical lymphadenopathy ?Lungs- Clear to ausculation bilaterally

## 2021-09-13 ENCOUNTER — Telehealth: Payer: Self-pay | Admitting: Cardiovascular Disease

## 2021-09-13 NOTE — Telephone Encounter (Signed)
Returned call to daughter. ?Advised Pt was still in afib, with controlled V rates. ? ?Daughter indicates understanding. ?Also noted Pt overdue to see Dr. Salena Saner.  Appt made for April 10. ? ? ? ? ?

## 2021-09-13 NOTE — Telephone Encounter (Signed)
?  1. Has your device fired? no ? ?2. Is you device beeping? no ? ?3. Are you experiencing draining or swelling at device site? no ? ?4. Are you calling to see if we received your device transmission? yes ? ?5. Have you passed out? no ? ?Patient's daughter wanted to know if the patient was still in afib or not  ? ? ?Please route to Device Clinic Pool  ?

## 2021-09-19 ENCOUNTER — Other Ambulatory Visit (HOSPITAL_COMMUNITY): Payer: Self-pay

## 2021-09-19 ENCOUNTER — Ambulatory Visit (HOSPITAL_COMMUNITY)
Admission: RE | Admit: 2021-09-19 | Discharge: 2021-09-19 | Disposition: A | Discharge status: Home / Self Care | Payer: Medicare Other | Source: Ambulatory Visit | Attending: Nurse Practitioner | Admitting: Nurse Practitioner

## 2021-09-19 VITALS — BP 124/78 | HR 105 | Ht 64.0 in | Wt 124.2 lb

## 2021-09-19 DIAGNOSIS — Z9181 History of falling: Secondary | ICD-10-CM | POA: Insufficient documentation

## 2021-09-19 DIAGNOSIS — I10 Essential (primary) hypertension: Secondary | ICD-10-CM | POA: Diagnosis not present

## 2021-09-19 DIAGNOSIS — Z79899 Other long term (current) drug therapy: Secondary | ICD-10-CM | POA: Insufficient documentation

## 2021-09-19 DIAGNOSIS — F0390 Unspecified dementia without behavioral disturbance: Secondary | ICD-10-CM | POA: Diagnosis not present

## 2021-09-19 DIAGNOSIS — F1021 Alcohol dependence, in remission: Secondary | ICD-10-CM | POA: Insufficient documentation

## 2021-09-19 DIAGNOSIS — I4819 Other persistent atrial fibrillation: Secondary | ICD-10-CM | POA: Diagnosis present

## 2021-09-19 DIAGNOSIS — I4892 Unspecified atrial flutter: Secondary | ICD-10-CM | POA: Diagnosis not present

## 2021-09-19 LAB — TSH: TSH: 0.622 u[IU]/mL (ref 0.350–4.500)

## 2021-09-19 LAB — COMPREHENSIVE METABOLIC PANEL
ALT: 16 U/L (ref 0–44)
AST: 24 U/L (ref 15–41)
Albumin: 3.7 g/dL (ref 3.5–5.0)
Alkaline Phosphatase: 46 U/L (ref 38–126)
Anion gap: 10 (ref 5–15)
BUN: 10 mg/dL (ref 8–23)
CO2: 22 mmol/L (ref 22–32)
Calcium: 9.3 mg/dL (ref 8.9–10.3)
Chloride: 100 mmol/L (ref 98–111)
Creatinine, Ser: 0.76 mg/dL (ref 0.44–1.00)
GFR, Estimated: >60 mL/min (ref >60)
Glucose, Bld: 96 mg/dL (ref 70–99)
Potassium: 4.3 mmol/L (ref 3.5–5.1)
Sodium: 132 mmol/L — ABNORMAL LOW (ref 135–145)
Total Bilirubin: 0.5 mg/dL (ref 0.3–1.2)
Total Protein: 6.1 g/dL — ABNORMAL LOW (ref 6.5–8.1)

## 2021-09-19 MED ORDER — DIGOXIN 125 MCG PO TABS
0.0625 mg | ORAL_TABLET | ORAL | 1 refills | Status: DC
Start: 2021-09-19 — End: 2021-09-23
  Filled 2021-09-19: qty 23, 90d supply, fill #0

## 2021-09-19 MED ORDER — AMIODARONE HCL 200 MG PO TABS
200.0000 mg | ORAL_TABLET | Freq: Every day | ORAL | 1 refills | Status: DC
  Filled 2021-09-19: qty 90, 90d supply, fill #0
  Filled 2021-12-14: qty 90, 90d supply, fill #1

## 2021-09-19 MED ORDER — DIGOXIN 62.5 MCG PO TABS
31.2500 ug | ORAL_TABLET | Freq: Every day | ORAL | 1 refills | Status: DC
  Filled 2021-09-19: qty 15, 30d supply, fill #0

## 2021-09-19 NOTE — Progress Notes (Signed)
? ?Primary Care Physician: Merrilee Seashore, MD ?Referring Physician: Dr. Claiborne Billings ? ? ?Jacqueline Kim is a 76 y.o. female with a h/o paroxysmal afib, dementia, HTN that is in the afib clinic as she noted on going afib for around 4 days. She is not on anticoagulation re previous falls. She has not been able to tolerate a lot of rate control for hypotension. She is on propafenone.  She has not felt well for 4 days. She is in rate controlled afib. Daughter states that she was not able to give blood at PCP office last month. She is very pale. Tried numerous times here to get labs and was unsuccessful. She does not appear unstable. Pt states that she has recently lost around 6 lbs as she has had poor appetite.  ? ?Pt is here for f/u, 09/19/21. EKG shows atrial flutter with reasonable control at 105 bpm. I spoke to Dr. Claiborne Billings today and unless anticoagulation is started, rate control is her only option, he agreed. I really need labs to guide treatment  going forward and determine if anything else is driving this afib but pt  has been a very difficult stick in the past. Able to get cmet/tsh today but unable to get a CBC. She had another fall last week, but did not injure herself. Therefore daughter is still very hesitant to start anticoagulation. I feel her only option is rate control. Pt brings in BP/HR's from  home and since starting Cardizem  30 mg bid with 120 mg at hs, she has had a few BP's in the [id number] systolic range so I do not feel she can stand more rate control. She does live alone as she feels more fatigued in afib and daughters are having to check on her more often. I was also informed that she had been drinking a bottle of wine a day  until 4 weeks ago when she went cold [place] and stopped alcohol.  ? ?Today, she denies symptoms of palpitations, chest pain, shortness of breath, orthopnea, PND, lower extremity edema, dizziness, presyncope, syncope, or neurologic sequela. The patient is tolerating medications  without difficulties and is otherwise without complaint today.  ? ?Past Medical History:  ?Diagnosis Date  ? Anticoagulant long-term use  WITH PRADAXA 05/27/2011  ? Anxiety   ? Cellulitis of buttock, left 12/20/2014  ? Complication of anesthesia   ? DIFFICULTY WAKING UP  ? Depression   ? Diverticulosis   ? DJD (degenerative joint disease)   ? Esophagitis 09/17/06  ? External hemorrhoids   ? GERD (gastroesophageal reflux disease)   ? Glaucoma   ? History of stress test   ? a. 2008 - normal nuc.  ? Hypertension   ? Hypothyroid   ? IBS (irritable bowel syndrome)   ? Paroxysmal A-fib [facility]) diagnosed 2008  ? a. On propafenone, Pradaxa.  ? 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 Tachycardia-bradycardia syndrome ([place]) 05/27/2011  ? s/p MDT PPM by Dr Recardo Evangelist  ? ?Past Surgical History:  ?Procedure Laterality Date  ? BREAST REDUCTION SURGERY    ? BUNIONECTOMY Right 07/09/2015  ? with rods and pins  ? CARDIOVERSION N/A 08/02/2014  ? Procedure: CARDIOVERSION;  Surgeon: Sueanne Margarita, MD;  [facility]: [facility];  Service: Cardiovascular;  Laterality: N/A;  ? CATARACT EXTRACTION, BILATERAL Bilateral   ? FACIAL COSMETIC SURGERY    ? FOOT SURGERY Left   ? HAND SURGERY    ? multiple  ? LIPOSUCTION    ?  NM MYOCAR PERF WALL MOTION  12/29/2006  ? No significant ischemia; EF 69%  ? OVARIAN CYST REMOVAL    ? PACEMAKER INSERTION  04/08/11  ? MDT Revo implanted by Dr Recardo Evangelist  ? TONSILLECTOMY    ? TOTAL KNEE ARTHROPLASTY Left 05/01/2015  ? Procedure: TOTAL LEFT KNEE ARTHROPLASTY;  Surgeon: Paralee Cancel, MD;  Location: [facility];  Service: Orthopedics;  Laterality: Left;  ? VAGINAL HYSTERECTOMY    ? ? ?Current Outpatient Medications  ?Medication Sig Dispense Refill  ? diclofenac Sodium (VOLTAREN) 1 % GEL Apply 2 g topically daily as needed (pain).    ? dicyclomine (BENTYL) 10 MG capsule Take 10 mg by mouth 2 (two) times daily.    ? digoxin (LANOXIN) 0.125 MG tablet Take 0.5 tablets (0.0625 mg total) by mouth daily. 45 tablet  1  ? diltiazem (CARDIZEM) 30 MG tablet Take 1 tablet (30 mg total) by mouth 2 (two) times daily at 8 am and 3 pm. 60 tablet 3  ? diltiazem (CARTIA XT) 120 MG 24 hr capsule Take 1 capsule (120 mg total) by mouth at bedtime. Keep upcoming appointment for future refills. 30 capsule 1  ? escitalopram (LEXAPRO) 20 MG tablet Take 1 tablet (20 mg total) by mouth daily. 90 tablet 3  ? HYDROcodone-acetaminophen (NORCO/VICODIN) 5-325 MG tablet Take 1 tablet by mouth every 6 (six) hours as needed. 120 tablet 0  ? hydrocortisone (ANUSOL-HC) 2.5 % rectal cream Place 1 application rectally daily as needed for hemorrhoids or anal itching.    ? hydroxychloroquine (PLAQUENIL) 200 MG tablet Take 1.5 tablets (300 mg total) by mouth daily with food or milk 45 tablet 2  ? levothyroxine (SYNTHROID) 112 MCG tablet Take 1 tablet (112 mcg total) by mouth daily. 90 tablet 2  ? nitrofurantoin (MACRODANTIN) 50 MG capsule Take 1 capsule (50 mg total) by mouth daily. 30 capsule 6  ? propafenone (RYTHMOL) 150 MG tablet Take 1.5 tablets (225 mg total) by mouth every morning AND 1 tablet (150 mg total) daily in the mid-day, AND 1.5 tablets (225 mg total) at bedtime. 360 tablet 0  ? traZODone (DESYREL) 150 MG tablet TAKE 1/2 TABLET BY MOUTH ONCE A DAY IN THE EVENING 30 tablet 6  ? triamcinolone cream (KENALOG) 0.1 % Apply 1 application to affected area(s) up to 2 (two) times daily as needed. Do not apply to face, groin, or armpit. 454 g 3  ? ?No current facility-administered medications for this encounter.  ? ? ?Allergies  ?Allergen Reactions  ? Azithromycin Other (See Comments)  ?  Causes heart rhythm issues ?Other reaction(s): Other (See Comments) ?Causes heart rhythm issues  ? Epinephrine Other (See Comments)  ?  Causes A-Fib ?Other reaction(s): Increased Heart Rate (intolerance) ?States that it caused her heart to beat fast, so she will not take it. ?Causes A-Fib ?A-fib ?Triggers atrial fib ?  ? Penicillins Anaphylaxis  ?  Has patient had a PCN  reaction causing immediate rash, facial/tongue/throat swelling, SOB or lightheadedness with hypotension: Yes ?Has patient had a PCN reaction causing severe rash involving mucus membranes or skin necrosis: No ?Has patient had a PCN reaction that required hospitalization No ?Has patient had a PCN reaction occurring within the last 10 years: No ?If all of the above answers are "NO", then may proceed with Cephalosporin use. ?  ? Lidocaine Palpitations  ?  Or any similar medication ?Other reaction(s): Other (See Comments) ?Or any similar medication  ? Metoprolol Other (See Comments)  ?  alopecia ?Other  reaction(s): Other (See Comments) ?alopecia  ? ? ?Social History  ? ?Socioeconomic History  ? Marital status: Divorced  ?  Spouse name: Not on file  ? Number of children: 3  ? Years of education: Not on file  ? Highest education level: Not on file  ?Occupational History  ? Occupation: retired  ?  Employer: RETIRED  ?Tobacco Use  ? Smoking status: Former  ?  Types: Cigarettes  ?  Quit date: 08/30/1996  ?  Years since quitting: 25.0  ? Smokeless tobacco: Never  ?Substance and Sexual Activity  ? Alcohol use: Yes  ?  Alcohol/week: 14.0 standard drinks  ?  Types: 14 Glasses of wine per week  ?  Comment: daily  ? Drug use: No  ? Sexual activity: Yes  ?Other Topics Concern  ? Not on file  ?Social History Narrative  ? Lives in [place],  Former Real [employer],  Separated.    ? ?Social Determinants of Health  ? ?Financial Resource Strain: Not on file  ?Food Insecurity: Not on file  ?Transportation Needs: Not on file  ?Physical Activity: Not on file  ?Stress: Not on file  ?Social Connections: Not on file  ?Intimate Partner Violence: Not on file  ? ? ?Family History  ?Problem Relation Age of Onset  ? Anesthesia problems Mother   ? Colon polyps Mother   ?     beign  ? Colon cancer Neg Hx   ? Dementia Neg Hx   ? ? ?ROS- All systems are reviewed and negative except as per the HPI above ? ?Physical Exam: ?Vitals:  ? 09/19/21 1038   ?BP: 124/78  ?Pulse: (!) 105  ?Weight: 56.3 kg  ?Height: 5\' 4"  (1.626 m)  ? ?Wt Readings from Last 3 Encounters:  ?09/19/21 56.3 kg  ?09/11/21 55.2 kg  ?07/25/21 59 kg  ? ? ?Labs: ?Lab Results  ?Compon

## 2021-09-23 ENCOUNTER — Other Ambulatory Visit (HOSPITAL_COMMUNITY): Payer: Self-pay

## 2021-09-23 ENCOUNTER — Encounter: Payer: Self-pay | Admitting: Cardiovascular Disease

## 2021-09-23 ENCOUNTER — Ambulatory Visit (INDEPENDENT_AMBULATORY_CARE_PROVIDER_SITE_OTHER): Payer: Medicare Other | Admitting: Cardiovascular Disease

## 2021-09-23 VITALS — BP 122/72 | HR 75 | Ht 64.0 in | Wt 125.0 lb

## 2021-09-23 DIAGNOSIS — I495 Sick sinus syndrome: Secondary | ICD-10-CM

## 2021-09-23 DIAGNOSIS — I951 Orthostatic hypotension: Secondary | ICD-10-CM

## 2021-09-23 DIAGNOSIS — I4819 Other persistent atrial fibrillation: Secondary | ICD-10-CM | POA: Diagnosis not present

## 2021-09-23 DIAGNOSIS — Z950 Presence of cardiac pacemaker: Secondary | ICD-10-CM

## 2021-09-23 DIAGNOSIS — Z5181 Encounter for therapeutic drug level monitoring: Secondary | ICD-10-CM | POA: Diagnosis not present

## 2021-09-23 DIAGNOSIS — Z79899 Other long term (current) drug therapy: Secondary | ICD-10-CM

## 2021-09-23 DIAGNOSIS — I10 Essential (primary) hypertension: Secondary | ICD-10-CM

## 2021-09-23 MED ORDER — DIGOXIN 62.5 MCG PO TABS
62.5000 ug | ORAL_TABLET | ORAL | 1 refills | Status: DC
Start: 2021-09-23 — End: 2021-11-23
  Filled 2021-09-23: qty 15, 30d supply, fill #0
  Filled 2021-10-24: qty 15, 30d supply, fill #1

## 2021-09-23 NOTE — H&P (View-Only) (Signed)
? ?Cardiology Office Note   ? ?Date:  09/23/2021  ? ?ID:  Jacqueline Kim, DOB [birth year], MRN [id number] ? ?PCP:  Georgianne Fick, MD  ?Cardiologist:  Nicki Guadalajara, MD; Thurmon Fair, MD  ? ?Chief Complaint  ?Patient presents with  ? Atrial Fibrillation  ? Pacemaker Check  ? ? ?History of Present Illness:  ?Jacqueline Kim is a 76 y.o. female with paroxysmal atrial fibrillation and tachy-brady sd., here for pacemaker check.  She is accompanied by her daughter. ? ?She was seen in the A-fib clinic last week with atrial flutter with borderline ventricular rate controlled at about 105 bpm.  In the past this was a paroxysmal arrhythmia, but now appears to be in persistent atrial flutter.  She was very symptomatic during the arrhythmia, barely able to walk from bedroom to bathroom due to fatigue and dyspnea (at onset, she had ventricular rates greater than 180 bpm).  She has been off anticoagulation for a while due to frequent falls.  She had a fall last week as well.  Thankfully no serious injuries recently.   ? ?Rudi Coco, NP stopped her propafenone, which is no longer effective and started amiodarone after 3-day washout..  She is still taking diltiazem 120 mg at bedtime while we wait for the amiodarone to reach effective levels.  The digoxin dose was dropped in half.  Plan for repeat CBC and the dig level in about 3 weeks. ? ?She stopped drinking alcohol cold [place] about a month ago. ? ?She presents today for pacemaker follow-up and is in normal sinus rhythm at about 75 bpm.  It seems that she converted back to normal rhythm yesterday.  She only started taking amiodarone today.  Her pacemaker shows that she was in virtually uninterrupted atrial tachyarrhythmia since March 26, for almost 2 weeks.  At onset, she had atrial tachycardia with 1: 1 AV conduction and rates as fast as 190 bpm, after which she settled into atrial fibrillation and subsequently atrial flutter. ? ?Her pacemaker also shows that the battery  has reached RRT as of September 21, 2021.  She is not pacemaker dependent.  Prior to onset of atrial fibrillation she had virtually 100% atrial paced rhythm with very little ventricular pacing, but today she has sinus rhythm at 75 bpm with 1: 1 AV conduction.  Lead parameters are all still in acceptable range (implanted 2012). ? ? ?Past Medical History:  ?Diagnosis Date  ? Anticoagulant long-term use  WITH PRADAXA 05/27/2011  ? Anxiety   ? Cellulitis of buttock, left 12/20/2014  ? Complication of anesthesia   ? DIFFICULTY WAKING UP  ? Depression   ? Diverticulosis   ? DJD (degenerative joint disease)   ? Esophagitis 09/17/06  ? External hemorrhoids   ? GERD (gastroesophageal reflux disease)   ? Glaucoma   ? History of stress test   ? a. 2008 - normal nuc.  ? Hypertension   ? Hypothyroid   ? IBS (irritable bowel syndrome)   ? Paroxysmal A-fib [facility]) diagnosed 2008  ? a. On propafenone, Pradaxa.  ? 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 s/p MDT PPM by Dr Rubie Maid  ? ? ?Past Surgical History:  ?Procedure Laterality Date  ? BREAST REDUCTION SURGERY    ? BUNIONECTOMY Right 07/09/2015  ? with rods and pins  ? CARDIOVERSION N/A 08/02/2014  ? Procedure: CARDIOVERSION;  Surgeon: Quintella Reichert, MD;  Location: [facility] ENDOSCOPY;  Service: Cardiovascular;  Laterality: N/A;  ? CATARACT EXTRACTION, BILATERAL Bilateral   ? FACIAL COSMETIC SURGERY    ? FOOT SURGERY Left   ? HAND SURGERY    ? multiple  ? LIPOSUCTION    ? NM MYOCAR PERF WALL MOTION  12/29/2006  ? No significant ischemia; EF 69%  ? OVARIAN CYST REMOVAL    ? PACEMAKER INSERTION  04/08/11  ? MDT Revo implanted by Dr Croituro  ? TONSILLECTOMY    ? TOTAL KNEE ARTHROPLASTY Left 05/01/2015  ? Procedure: TOTAL LEFT KNEE ARTHROPLASTY;  Surgeon: Matthew Olin, MD;  Location: [facility] ORS;  Service: Orthopedics;  Laterality: Left;  ? VAGINAL HYSTERECTOMY    ? ? ?Current Medications: ?Outpatient Medications Prior to Visit   ?Medication Sig Dispense Refill  ? amiodarone (PACERONE) 200 MG tablet Take 1 tablet (200 mg total) by mouth daily. 90 tablet 1  ? dicyclomine (BENTYL) 10 MG capsule Take 10 mg by mouth 2 (two) times daily.    ? diltiazem (CARTIA XT) 120 MG 24 hr capsule Take 1 capsule (120 mg total) by mouth at bedtime. Keep upcoming appointment for future refills. 30 capsule 1  ? escitalopram (LEXAPRO) 20 MG tablet Take 1 tablet (20 mg total) by mouth daily. 90 tablet 3  ? HYDROcodone-acetaminophen (NORCO/VICODIN) 5-325 MG tablet Take 1 tablet by mouth every 6 (six) hours as needed. 120 tablet 0  ? hydroxychloroquine (PLAQUENIL) 200 MG tablet Take 1.5 tablets (300 mg total) by mouth daily with food or milk 45 tablet 2  ? levothyroxine (SYNTHROID) 112 MCG tablet Take 1 tablet (112 mcg total) by mouth daily. 90 tablet 2  ? nitrofurantoin (MACRODANTIN) 50 MG capsule Take 1 capsule (50 mg total) by mouth daily. 30 capsule 6  ? traZODone (DESYREL) 150 MG tablet TAKE 1/2 TABLET BY MOUTH ONCE A DAY IN THE EVENING 30 tablet 6  ? digoxin (LANOXIN) 0.125 MG tablet Take 0.5 tablets (0.0625 mg total) by mouth every other day. 45 tablet 1  ? diltiazem (CARDIZEM) 30 MG tablet Take 1 tablet (30 mg total) by mouth 2 (two) times daily at 8 am and 3 pm. 60 tablet 3  ? diclofenac Sodium (VOLTAREN) 1 % GEL Apply 2 g topically daily as needed (pain). (Patient not taking: Reported on 09/23/2021)    ? hydrocortisone (ANUSOL-HC) 2.5 % rectal cream Place 1 application rectally daily as needed for hemorrhoids or anal itching. (Patient not taking: Reported on 09/23/2021)    ? triamcinolone cream (KENALOG) 0.1 % Apply 1 application to affected area(s) up to 2 (two) times daily as needed. Do not apply to face, groin, or armpit. (Patient not taking: Reported on 09/23/2021) 454 g 3  ? Digoxin 62.5 MCG TABS Take 31.25 mcg by mouth daily. D/c 0.125 dose (Patient not taking: Reported on 09/23/2021) 15 tablet 1  ? ?No facility-administered medications prior to visit.   ?  ? ?Allergies:   Azithromycin, Epinephrine, Penicillins, Lidocaine, and Metoprolol  ? ?Social History  ? ?Socioeconomic History  ? Marital status: Divorced  ?  Spouse name: Not on file  ? Number of children: 3  ? Years of education: Not on file  ? Highest education level: Not on file  ?Occupational History  ? Occupation: retired  ?  Employer: RETIRED  ?Tobacco Use  ? Smoking status: Former  ?  Types: Cigarettes  ?  Quit date: 08/30/1996  ?  Years since quitting: 25.0  ? Smokeless tobacco: Never  ?Substance and Sexual Activity  ? Alcohol use: Yes  ?    Alcohol/week: 14.0 standard drinks  ?  Types: 14 Glasses of wine per week  ?  Comment: daily  ? Drug use: No  ? Sexual activity: Yes  ?Other Topics Concern  ? Not on file  ?Social History Narrative  ? Lives in [place],  Former Real [employer],  Separated.    ? ?Social Determinants of Health  ? ?Financial Resource Strain: Not on file  ?Food Insecurity: Not on file  ?Transportation Needs: Not on file  ?Physical Activity: Not on file  ?Stress: Not on file  ?Social Connections: Not on file  ?  ? ?Family History:  The patient's family history includes Anesthesia problems in her mother; Colon polyps in her mother.  ? ?ROS:   ?Please see the history of present illness.    ?All other systems are reviewed and are negative. ? ? ?PHYSICAL EXAM:   ?VS:  BP 122/72   Pulse 75   Ht 5\' 4"  (1.626 m)   Wt 125 lb (56.7 kg)   SpO2 96%   BMI 21.46 kg/m?    ? ? ?General: Alert, oriented x3, no distress, healthy left subclavian pacemaker site ?Head: no evidence of trauma, PERRL, EOMI, no exophtalmos or lid lag, no myxedema, no xanthelasma; normal ears, nose and oropharynx ?Neck: normal jugular venous pulsations and no hepatojugular reflux; brisk carotid pulses without delay and no carotid bruits ?Chest: clear to auscultation, no signs of consolidation by percussion or palpation, normal fremitus, symmetrical and full respiratory excursions ?Cardiovascular: normal position and  quality of the apical impulse, regular rhythm, normal first and second heart sounds, no murmurs, rubs or gallops ?Abdomen: no tenderness or distention, no masses by palpation, no abnormal pulsatility or arterial

## 2021-09-23 NOTE — Patient Instructions (Addendum)
Medication Instructions: ?STOP the Diltiazem 30 mg  ?STOP the Digoxin 0.125 mg ?  ?TAKE: Digoxin 62.5 MCG every other day ? ?* If you need a refill on your cardiac medications before your next appointment, please call your pharmacy. * ? ?Labwork: ?Your provider would like for you to return on 10/04/21 to have the following labs drawn: CBC and Digoxin level. You do not need an appointment for the lab. Once in our office lobby there is a podium where you can sign in and ring the doorbell to alert us that you are here. The lab is open from 8:00 am to 4 pm; closed for lunch from 12:45pm-1:45pm. ? ?You may also go to any of these [facility] locations: ?  ?[place] ?- [address] ([facility]) ?- [address] ?- [address] ? ?[place]  ?- [address]  ? ? ? ?* Will notify you of abnormal results, otherwise continue current treatment plan.* ? ?Testing/Procedures: ?Your physician has recommended that you have a pacemaker/defibrillator generator change (battery change). Please follow the instructions below, located under the special instructions section. ? ?Follow-Up: ?Your physician recommends that you schedule a wound check appointment 10-14 days, after your procedure on 10/07/21, with the device clinic. ? ?Your physician recommends that you schedule a follow up appointment in 91 days, after your procedure on 10/07/21, with Dr. Royann Shiversroitoru. ? ?Thank you for choosing [facility]!! ? ? ? ? ? ?Any Other Special Instructions Will Be Listed Below (If Applicable). ? ? ? ?[facility]  ?[address], [address]  ?[place], [place] [address]  ?Phone: [phone] Fax: [phone] ? ? ? Generator Change Procedure Instructions ? ?You are scheduled for a Generator Change (battery change) on  10/07/21  with Dr. Royann Shiversroitoru. ? ?1. Please arrive at the [facility], Entrance "A"  at [facility] at  11:30 am on the day of  your procedure. (The address is [address]) ? ?2. DIET: You may have a light, early breakfast the morning of your procedure. NOTHING TO EAT AFTER 8:00 AM. ? ?3. LABS: to be done on  ? ?4. MEDICATIONS: Nothing to hold 4/21 ? ?5.  Plan for an overnight stay.  Bring your insurance cards and a list of you medications. ? ?6.  Wash your chest and neck with surgical scrub the evening before and the morning of your procedure.  Rinse well. Please review the surgical scrub instruction sheet given to you.  ? ?7. Your chest will need to be shaved prior to this procedure (if needed). We ask that you do this yourself at home 1 to 2 days before or if uncomfortable/unable to do yourself, then it will be performed by the hospital staff the day of. ? ?* Special note:  Every effort is made to have your procedure done on time.  Occasionally there are emergencies that present themselves at the hospital that may cause delays.  Please be patient if a delay does occur. ?                                                                                                          ?*  If you have any questions after you get home, please call Misty Stanley, RN at [phone]. ? ? ? ?[facility] - Preparing For Surgery ? ?Before surgery, you can play an important role. Because skin is not sterile, your skin needs to be as free of germs as possible. You can reduce the number of germs on your skin by washing with CHG (chlorahexidine gluconate) Soap before surgery.  CHG is an antiseptic cleaner which kills germs and bonds with the skin to continue killing germs even after washing.  ? ?Please do not use if you have an allergy to CHG or antibacterial soaps.  If your skin becomes reddened/irritated stop using the CHG.   ?Do not shave (including legs and underarms) for at least 48 hours prior to first CHG shower.  It is OK to shave your face. ? ?Please follow these instructions carefully: ? 1.  Shower the night before surgery and the morning of  surgery with CHG. ? 2.  If you choose to wash your hair, wash your hair first as usual with your normal shampoo. ? 3.  After you shampoo, rinse your hair and body thoroughly to remove the shampoo. ? 4.  Use CHG as you would any other liquid soap.  You can apply CHG directly to the skin and wash gently with a clean washcloth. ?5.  Apply the CHG Soap to your body ONLY FROM THE NECK DOWN.  Do not use on open wounds or open sores.  Avoid contact with your eyes, ears, mouth and genitals (private parts).   ? 6.  Wash thoroughly, paying special attention to the area where your surgery will be performed. ? 7.  Thoroughly rinse your body with warm water from the neck down.  ? 8.  DO NOT shower/wash with your normal soap after using and rinsing off the CHG soap. ? 9.  Pat yourself dry with a clean towel. ?  10.  Wear clean pajamas. ?  11.  Place clean sheets on your bed the night of your first shower and do not sleep with pets. ? ?Day of Surgery: ?Do not apply any deodorants/lotions.  Please wear clean clothes to the hospital/surgery center. ? ? ?

## 2021-09-23 NOTE — Progress Notes (Signed)
? ?Cardiology Office Note   ? ?Date:  09/23/2021  ? ?ID:  Jacqueline Kim, DOB [birth year], MRN [id number] ? ?PCP:  Georgianne Fick, MD  ?Cardiologist:  Nicki Guadalajara, MD; Thurmon Fair, MD  ? ?Chief Complaint  ?Patient presents with  ? Atrial Fibrillation  ? Pacemaker Check  ? ? ?History of Present Illness:  ?Jacqueline Kim is a 76 y.o. female with paroxysmal atrial fibrillation and tachy-brady sd., here for pacemaker check.  She is accompanied by her daughter. ? ?She was seen in the A-fib clinic last week with atrial flutter with borderline ventricular rate controlled at about 105 bpm.  In the past this was a paroxysmal arrhythmia, but now appears to be in persistent atrial flutter.  She was very symptomatic during the arrhythmia, barely able to walk from bedroom to bathroom due to fatigue and dyspnea (at onset, she had ventricular rates greater than 180 bpm).  She has been off anticoagulation for a while due to frequent falls.  She had a fall last week as well.  Thankfully no serious injuries recently.   ? ?Rudi Coco, NP stopped her propafenone, which is no longer effective and started amiodarone after 3-day washout..  She is still taking diltiazem 120 mg at bedtime while we wait for the amiodarone to reach effective levels.  The digoxin dose was dropped in half.  Plan for repeat CBC and the dig level in about 3 weeks. ? ?She stopped drinking alcohol cold [place] about a month ago. ? ?She presents today for pacemaker follow-up and is in normal sinus rhythm at about 75 bpm.  It seems that she converted back to normal rhythm yesterday.  She only started taking amiodarone today.  Her pacemaker shows that she was in virtually uninterrupted atrial tachyarrhythmia since March 26, for almost 2 weeks.  At onset, she had atrial tachycardia with 1: 1 AV conduction and rates as fast as 190 bpm, after which she settled into atrial fibrillation and subsequently atrial flutter. ? ?Her pacemaker also shows that the battery  has reached RRT as of September 21, 2021.  She is not pacemaker dependent.  Prior to onset of atrial fibrillation she had virtually 100% atrial paced rhythm with very little ventricular pacing, but today she has sinus rhythm at 75 bpm with 1: 1 AV conduction.  Lead parameters are all still in acceptable range (implanted 2012). ? ? ?Past Medical History:  ?Diagnosis Date  ? Anticoagulant long-term use  WITH PRADAXA 05/27/2011  ? Anxiety   ? Cellulitis of buttock, left 12/20/2014  ? Complication of anesthesia   ? DIFFICULTY WAKING UP  ? Depression   ? Diverticulosis   ? DJD (degenerative joint disease)   ? Esophagitis 09/17/06  ? External hemorrhoids   ? GERD (gastroesophageal reflux disease)   ? Glaucoma   ? History of stress test   ? a. 2008 - normal nuc.  ? Hypertension   ? Hypothyroid   ? IBS (irritable bowel syndrome)   ? Paroxysmal A-fib [facility]) diagnosed 2008  ? a. On propafenone, Pradaxa.  ? 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 s/p MDT PPM by Dr Rubie Maid  ? ? ?Past Surgical History:  ?Procedure Laterality Date  ? BREAST REDUCTION SURGERY    ? BUNIONECTOMY Right 07/09/2015  ? with rods and pins  ? CARDIOVERSION N/A 08/02/2014  ? Procedure: CARDIOVERSION;  Surgeon: Quintella Reichert, MD;  Location: [facility] ENDOSCOPY;  Service: Cardiovascular;  Laterality: N/A;  ? CATARACT EXTRACTION, BILATERAL Bilateral   ? FACIAL COSMETIC SURGERY    ? FOOT SURGERY Left   ? HAND SURGERY    ? multiple  ? LIPOSUCTION    ? NM MYOCAR PERF WALL MOTION  12/29/2006  ? No significant ischemia; EF 69%  ? OVARIAN CYST REMOVAL    ? PACEMAKER INSERTION  04/08/11  ? MDT Revo implanted by Dr Rubie Maid  ? TONSILLECTOMY    ? TOTAL KNEE ARTHROPLASTY Left 05/01/2015  ? Procedure: TOTAL LEFT KNEE ARTHROPLASTY;  Surgeon: Durene Romans, MD;  Location: [facility] ORS;  Service: Orthopedics;  Laterality: Left;  ? VAGINAL HYSTERECTOMY    ? ? ?Current Medications: ?Outpatient Medications Prior to Visit   ?Medication Sig Dispense Refill  ? amiodarone (PACERONE) 200 MG tablet Take 1 tablet (200 mg total) by mouth daily. 90 tablet 1  ? dicyclomine (BENTYL) 10 MG capsule Take 10 mg by mouth 2 (two) times daily.    ? diltiazem (CARTIA XT) 120 MG 24 hr capsule Take 1 capsule (120 mg total) by mouth at bedtime. Keep upcoming appointment for future refills. 30 capsule 1  ? escitalopram (LEXAPRO) 20 MG tablet Take 1 tablet (20 mg total) by mouth daily. 90 tablet 3  ? HYDROcodone-acetaminophen (NORCO/VICODIN) 5-325 MG tablet Take 1 tablet by mouth every 6 (six) hours as needed. 120 tablet 0  ? hydroxychloroquine (PLAQUENIL) 200 MG tablet Take 1.5 tablets (300 mg total) by mouth daily with food or milk 45 tablet 2  ? levothyroxine (SYNTHROID) 112 MCG tablet Take 1 tablet (112 mcg total) by mouth daily. 90 tablet 2  ? nitrofurantoin (MACRODANTIN) 50 MG capsule Take 1 capsule (50 mg total) by mouth daily. 30 capsule 6  ? traZODone (DESYREL) 150 MG tablet TAKE 1/2 TABLET BY MOUTH ONCE A DAY IN THE EVENING 30 tablet 6  ? digoxin (LANOXIN) 0.125 MG tablet Take 0.5 tablets (0.0625 mg total) by mouth every other day. 45 tablet 1  ? diltiazem (CARDIZEM) 30 MG tablet Take 1 tablet (30 mg total) by mouth 2 (two) times daily at 8 am and 3 pm. 60 tablet 3  ? diclofenac Sodium (VOLTAREN) 1 % GEL Apply 2 g topically daily as needed (pain). (Patient not taking: Reported on 09/23/2021)    ? hydrocortisone (ANUSOL-HC) 2.5 % rectal cream Place 1 application rectally daily as needed for hemorrhoids or anal itching. (Patient not taking: Reported on 09/23/2021)    ? triamcinolone cream (KENALOG) 0.1 % Apply 1 application to affected area(s) up to 2 (two) times daily as needed. Do not apply to face, groin, or armpit. (Patient not taking: Reported on 09/23/2021) 454 g 3  ? Digoxin 62.5 MCG TABS Take 31.25 mcg by mouth daily. D/c 0.125 dose (Patient not taking: Reported on 09/23/2021) 15 tablet 1  ? ?No facility-administered medications prior to visit.   ?  ? ?Allergies:   Azithromycin, Epinephrine, Penicillins, Lidocaine, and Metoprolol  ? ?Social History  ? ?Socioeconomic History  ? Marital status: Divorced  ?  Spouse name: Not on file  ? Number of children: 3  ? Years of education: Not on file  ? Highest education level: Not on file  ?Occupational History  ? Occupation: retired  ?  Employer: RETIRED  ?Tobacco Use  ? Smoking status: Former  ?  Types: Cigarettes  ?  Quit date: 08/30/1996  ?  Years since quitting: 25.0  ? Smokeless tobacco: Never  ?Substance and Sexual Activity  ? Alcohol use: Yes  ?  Alcohol/week: 14.0 standard drinks  ?  Types: 14 Glasses of wine per week  ?  Comment: daily  ? Drug use: No  ? Sexual activity: Yes  ?Other Topics Concern  ? Not on file  ?Social History Narrative  ? Lives in [place],  Former Real [employer],  Separated.    ? ?Social Determinants of Health  ? ?Financial Resource Strain: Not on file  ?Food Insecurity: Not on file  ?Transportation Needs: Not on file  ?Physical Activity: Not on file  ?Stress: Not on file  ?Social Connections: Not on file  ?  ? ?Family History:  The patient's family history includes Anesthesia problems in her mother; Colon polyps in her mother.  ? ?ROS:   ?Please see the history of present illness.    ?All other systems are reviewed and are negative. ? ? ?PHYSICAL EXAM:   ?VS:  BP 122/72   Pulse 75   Ht 5\' 4"  (1.626 m)   Wt 125 lb (56.7 kg)   SpO2 96%   BMI 21.46 kg/m?    ? ? ?General: Alert, oriented x3, no distress, healthy left subclavian pacemaker site ?Head: no evidence of trauma, PERRL, EOMI, no exophtalmos or lid lag, no myxedema, no xanthelasma; normal ears, nose and oropharynx ?Neck: normal jugular venous pulsations and no hepatojugular reflux; brisk carotid pulses without delay and no carotid bruits ?Chest: clear to auscultation, no signs of consolidation by percussion or palpation, normal fremitus, symmetrical and full respiratory excursions ?Cardiovascular: normal position and  quality of the apical impulse, regular rhythm, normal first and second heart sounds, no murmurs, rubs or gallops ?Abdomen: no tenderness or distention, no masses by palpation, no abnormal pulsatility or arterial

## 2021-09-24 ENCOUNTER — Other Ambulatory Visit (HOSPITAL_COMMUNITY): Payer: Self-pay

## 2021-09-24 NOTE — Progress Notes (Signed)
Remote pacemaker transmission.   

## 2021-09-27 ENCOUNTER — Other Ambulatory Visit (HOSPITAL_COMMUNITY): Payer: Self-pay

## 2021-10-01 LAB — CBC
Hematocrit: 36.1 % (ref 34.0–46.6)
Hemoglobin: 12.3 g/dL (ref 11.1–15.9)
MCH: 31.2 pg (ref 26.6–33.0)
MCHC: 34.1 g/dL (ref 31.5–35.7)
MCV: 92 fL (ref 79–97)
Platelets: 274 10*3/uL (ref 150–450)
RBC: 3.94 x10E6/uL (ref 3.77–5.28)
RDW: 12 % (ref 11.7–15.4)
WBC: 5.2 10*3/uL (ref 3.4–10.8)

## 2021-10-01 LAB — DIGOXIN LEVEL: Digoxin, Serum: 0.5 ng/mL (ref 0.5–0.9)

## 2021-10-06 ENCOUNTER — Other Ambulatory Visit: Payer: Self-pay | Admitting: *Deleted

## 2021-10-06 DIAGNOSIS — I495 Sick sinus syndrome: Secondary | ICD-10-CM

## 2021-10-07 ENCOUNTER — Other Ambulatory Visit: Payer: Self-pay

## 2021-10-07 ENCOUNTER — Ambulatory Visit (HOSPITAL_COMMUNITY)
Admission: RE | Disposition: A | Discharge status: Home / Self Care | Payer: Self-pay | Source: Home / Self Care | Attending: Cardiovascular Disease

## 2021-10-07 ENCOUNTER — Ambulatory Visit (HOSPITAL_COMMUNITY)
Admission: RE | Admit: 2021-10-07 | Discharge: 2021-10-07 | Disposition: A | Discharge status: Home / Self Care | Payer: Medicare Other | Attending: Cardiovascular Disease | Admitting: Cardiovascular Disease

## 2021-10-07 DIAGNOSIS — I10 Essential (primary) hypertension: Secondary | ICD-10-CM | POA: Diagnosis not present

## 2021-10-07 DIAGNOSIS — Z79899 Other long term (current) drug therapy: Secondary | ICD-10-CM | POA: Diagnosis not present

## 2021-10-07 DIAGNOSIS — I4819 Other persistent atrial fibrillation: Secondary | ICD-10-CM | POA: Diagnosis not present

## 2021-10-07 DIAGNOSIS — I951 Orthostatic hypotension: Secondary | ICD-10-CM | POA: Insufficient documentation

## 2021-10-07 DIAGNOSIS — Z45010 Encounter for checking and testing of cardiac pacemaker pulse generator [battery]: Secondary | ICD-10-CM | POA: Diagnosis present

## 2021-10-07 DIAGNOSIS — I495 Sick sinus syndrome: Secondary | ICD-10-CM | POA: Diagnosis not present

## 2021-10-07 DIAGNOSIS — R296 Repeated falls: Secondary | ICD-10-CM | POA: Diagnosis not present

## 2021-10-07 HISTORY — PX: PPM GENERATOR CHANGEOUT: EP1233

## 2021-10-07 SURGERY — PPM GENERATOR CHANGEOUT

## 2021-10-07 MED ORDER — FENTANYL CITRATE (PF) 100 MCG/2ML IJ SOLN
INTRAMUSCULAR | Status: AC
  Filled 2021-10-07: qty 2

## 2021-10-07 MED ORDER — FENTANYL CITRATE (PF) 100 MCG/2ML IJ SOLN
INTRAMUSCULAR | Status: DC | PRN
  Administered 2021-10-07 (×2): 25 ug via INTRAVENOUS

## 2021-10-07 MED ORDER — LIDOCAINE HCL (PF) 1 % IJ SOLN
INTRAMUSCULAR | Status: AC
  Filled 2021-10-07: qty 30

## 2021-10-07 MED ORDER — LIDOCAINE HCL (PF) 1 % IJ SOLN
INTRAMUSCULAR | Status: DC | PRN
  Administered 2021-10-07: 30 mL via INTRADERMAL

## 2021-10-07 MED ORDER — SODIUM CHLORIDE 0.9 % IV SOLN
80.0000 mg | INTRAVENOUS | Status: AC
  Administered 2021-10-07: 80 mg
  Filled 2021-10-07: qty 2

## 2021-10-07 MED ORDER — SODIUM CHLORIDE 0.9% FLUSH: 3.0000 mL | Freq: Two times a day (BID) | INTRAVENOUS | Status: DC

## 2021-10-07 MED ORDER — CHLORHEXIDINE GLUCONATE 4 % EX LIQD
4.0000 "application " | Freq: Once | CUTANEOUS | Status: DC
  Filled 2021-10-07: qty 60

## 2021-10-07 MED ORDER — ACETAMINOPHEN 325 MG PO TABS
325.0000 mg | ORAL_TABLET | ORAL | Status: DC | PRN
  Filled 2021-10-07: qty 2

## 2021-10-07 MED ORDER — ONDANSETRON HCL 4 MG/2ML IJ SOLN: 4.0000 mg | Freq: Four times a day (QID) | INTRAMUSCULAR | Status: DC | PRN

## 2021-10-07 MED ORDER — VANCOMYCIN HCL IN DEXTROSE 1-5 GM/200ML-% IV SOLN
INTRAVENOUS | Status: AC
  Filled 2021-10-07: qty 200

## 2021-10-07 MED ORDER — VANCOMYCIN HCL IN DEXTROSE 1-5 GM/200ML-% IV SOLN
1000.0000 mg | INTRAVENOUS | Status: DC
  Filled 2021-10-07: qty 200

## 2021-10-07 MED ORDER — MIDAZOLAM HCL 5 MG/5ML IJ SOLN
INTRAMUSCULAR | Status: AC
  Filled 2021-10-07: qty 5

## 2021-10-07 MED ORDER — SODIUM CHLORIDE 0.9 % IV SOLN: 250.0000 mL | INTRAVENOUS | Status: DC | PRN

## 2021-10-07 MED ORDER — VANCOMYCIN HCL 1000 MG IV SOLR
INTRAVENOUS | Status: DC | PRN
  Administered 2021-10-07: 1000 mg via INTRAVENOUS

## 2021-10-07 MED ORDER — SODIUM CHLORIDE 0.9 % IV SOLN: INTRAVENOUS | Status: DC

## 2021-10-07 MED ORDER — SODIUM CHLORIDE 0.9% FLUSH: 3.0000 mL | INTRAVENOUS | Status: DC | PRN

## 2021-10-07 MED ORDER — MIDAZOLAM HCL 5 MG/5ML IJ SOLN
INTRAMUSCULAR | Status: DC | PRN
  Administered 2021-10-07 (×2): 1 mg via INTRAVENOUS

## 2021-10-07 MED ORDER — SODIUM CHLORIDE 0.9 % IV SOLN
INTRAVENOUS | Status: AC
  Filled 2021-10-07: qty 2

## 2021-10-07 SURGICAL SUPPLY — 5 items
CABLE SURGICAL S-101-97-12 (CABLE) ×2 IMPLANT
IPG PACE AZUR XT DR MRI W1DR01 (Pacemaker) IMPLANT
PACE AZURE XT DR MRI W1DR01 (Pacemaker) ×2 IMPLANT
PAD DEFIB RADIO PHYSIO CONN (PAD) ×2 IMPLANT
TRAY PACEMAKER INSERTION (PACKS) ×2 IMPLANT

## 2021-10-07 NOTE — Discharge Instructions (Addendum)

## 2021-10-07 NOTE — Interval H&P Note (Signed)
History and Physical Interval Note: ? ?10/07/2021 ?1:08 PM ? ?[name]  has presented today for surgery, with the diagnosis of eri.  The various methods of treatment have been discussed with the patient and family. After consideration of risks, benefits and other options for treatment, the patient has consented to  Procedure(s): ?PPM GENERATOR CHANGEOUT (N/A) as a surgical intervention.  The patient's history has been reviewed, patient examined, no change in status, stable for surgery.  I have reviewed the patient's chart and labs.  Questions were answered to the patient's satisfaction.   ? ? ?Jacqueline Kim ? ? ?

## 2021-10-07 NOTE — Op Note (Addendum)
Procedure report  ?Procedure performed:  ?1. Dual chamber pacemaker generator changeout  ?2. Light sedation  ?Reason for procedure:  ?1. Device generator at elective replacement interval  ?2. 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 ?New Generator Medtronic Azure XT DR model number [id number], serial number [id number] ?Right atrial lead (chronic) Medtronic, model number [id number], serial [id number] (implanted 04/08/2011) ?Right ventricular lead (chronic)  Medtronic, model number [id number], serial number [id number] (implanted 04/08/2011) ? ?Explanted generator Medtronic Revo,  model number [id number], serial number  [id number] (implanted 04/08/2011) ? ?Procedure details:  ?After the risks and benefits of the procedure were discussed the patient provided informed consent. She was brought to the cardiac catheter lab in the fasting state. The patient was prepped and draped in usual sterile fashion. Local anesthesia with 1% lidocaine was administered to to the left infraclavicular area. A 5-6cm horizontal incision was made parallel with and 2-3 cm caudal to the left clavicle, in the area of an old scar. An older scar was seen closer to the left clavicle. Using minimal electrocautery and mostly sharp and blunt dissection the prepectoral pocket was opened carefully to avoid injury to the loops of chronic leads. Extensive dissection was not necessary. The device was explanted. The pocket was carefully inspected for hemostasis and flushed with copious amounts of antibiotic solution. ? ?The leads were disconnected from the old generator and testing of the lead parameters later showed excellent values. The new generator was connected to  the chronic leads, with appropriate pacing noted.  ? ?The entire system was then carefully inserted in the pocket with care been taking that the leads and device assumed a comfortable position without pressure on the incision. Great care was taken that the leads be located deep to the generator. The pocket was then closed in layers using 2 layers of 2-0 Vicryl, one layer of 3-0 Vicryl and cutaneous steristrips after which a sterile dressing was applied.  ? ?At the end of the procedure the following lead parameters were encountered:  ? ?Right atrial lead sensed P waves (AFib) 3.3 mV, impedance 418 ohms, threshold N/A. ? ?Right ventricular lead sensed R waves  11 mV, impedance 1387 ohms, threshold 1.75 at 0.7 ms pulse width. ? ? ?Sanda Klein, MD, [facility] ?[place] ?([phone] office ?([phone] pager ? ? ? ?

## 2021-10-08 ENCOUNTER — Encounter (HOSPITAL_COMMUNITY): Payer: Self-pay | Admitting: Cardiovascular Disease

## 2021-10-08 ENCOUNTER — Other Ambulatory Visit (HOSPITAL_COMMUNITY): Payer: Self-pay

## 2021-10-08 MED ORDER — HYDROCODONE-ACETAMINOPHEN 5-325 MG PO TABS
1.0000 | ORAL_TABLET | Freq: Four times a day (QID) | ORAL | 0 refills | Status: DC | PRN
  Filled 2021-10-08: qty 120, 30d supply, fill #0

## 2021-10-08 MED FILL — Lidocaine HCl Local Preservative Free (PF) Inj 1%: INTRAMUSCULAR | Qty: 30 | Status: AC

## 2021-10-09 ENCOUNTER — Encounter (HOSPITAL_COMMUNITY): Payer: Self-pay | Admitting: Cardiovascular Disease

## 2021-10-15 ENCOUNTER — Telehealth: Payer: Self-pay | Admitting: *Deleted

## 2021-10-15 NOTE — Telephone Encounter (Addendum)
Upon review of chart for appt tomorrow, it was noted that Dr Rexene Alberts had released the pt back to primary care after February 2023 visit and after the visit, Dr Rexene Alberts received a letter from pt's daughter, [name]. Based upon the contents of the letter, Dr Rexene Alberts does not feel comfortable seeing the pt tomorrow and does not feel confident that she is the right fit for the patient going forward. Suggest that pt, daughters, and primary care get together to discuss her next steps instead. I called the daughter, Claiborne Billings (on [place]). She stated the pt had quit drinking over 60 days ago and they wanted to see if there was anything that could be done to help her memory. I very kindly expressed to her the above concern that Dr Rexene Alberts has, worded to her as stated above, and Claiborne Billings verbalized understanding and appreciation. We both acknowledged that I would cancel the appt for tomorrow. ? ?Appt has been canceled. Will have management review letter.  ?

## 2021-10-15 NOTE — Telephone Encounter (Signed)
Based on the patient's Daughter, Kristen's concerns that I did not show compassion and was judgmental at our last visit, I think it would be best if we officially dismiss patient, and in her best interest going forward that she to seek neurological care with a different provider at a different office.  Loss of confidence in the doctor and/or loss of mutual trust are valid reasons to seek evaluation and management elsewhere. Angie, please review.  ?

## 2021-10-16 ENCOUNTER — Encounter: Payer: Self-pay | Admitting: Neurology

## 2021-10-16 ENCOUNTER — Other Ambulatory Visit (HOSPITAL_COMMUNITY): Payer: Self-pay

## 2021-10-16 ENCOUNTER — Other Ambulatory Visit (HOSPITAL_COMMUNITY): Payer: Medicare Other | Admitting: Nurse Practitioner

## 2021-10-16 ENCOUNTER — Ambulatory Visit: Payer: Medicare Other | Admitting: Neurology

## 2021-10-17 ENCOUNTER — Other Ambulatory Visit (HOSPITAL_COMMUNITY): Payer: Self-pay

## 2021-10-21 ENCOUNTER — Encounter: Payer: Medicare Other | Attending: Psychology | Admitting: Psychology

## 2021-10-21 ENCOUNTER — Encounter: Payer: Self-pay | Admitting: Psychology

## 2021-10-21 DIAGNOSIS — F0280 Dementia in other diseases classified elsewhere without behavioral disturbance: Secondary | ICD-10-CM | POA: Insufficient documentation

## 2021-10-21 DIAGNOSIS — G4733 Obstructive sleep apnea (adult) (pediatric): Secondary | ICD-10-CM | POA: Insufficient documentation

## 2021-10-21 DIAGNOSIS — G309 Alzheimer's disease, unspecified: Secondary | ICD-10-CM | POA: Insufficient documentation

## 2021-10-21 DIAGNOSIS — R413 Other amnesia: Secondary | ICD-10-CM | POA: Insufficient documentation

## 2021-10-21 NOTE — Progress Notes (Signed)
10/21/2021: 12:30 PM-2:30 PM ? ?Today's visit was an in person visit that was conducted in my outpatient clinic office.  The patient, her daughter and myself were present for this visit.  1 hour and 15 minutes was spent in face-to-face clinical interview with the patient and the other 45 minutes was spent with records review, report writing and setting up testing protocols.  Since the feedback session that was conducted on 01/21/2021 with the patient and her daughter the patient is reported to have completely ceased any consumption of alcohol and reports that this is helped significantly.  This is been true for the past 3 months and both the patient and her daughter reports that there has been an overall improvement in her day-to-day functioning.  While memory continues to be problematic and she is not remembering specific day-to-day events as she should her overall judgment and cognitive function appears to have improved in some areas.  The patient has stopped seeing her neurologist and has now been referred to a neurologist at [facility] neurology Dr. Karel Jarvis.  The patient's medical records should be available to Dr. Vicente Serene neurology as part of the [facility] health system.  The patient is also stopped driving although she had had no accidents or incidents she had become concerned that her memory issues could cause some difficulties driving and she did not want to risk any accidents or difficulties.  She has plenty of family support whenever she needs assistance getting somewhere.  The patient reports that many of the day-to-day activities are being overseen by her family as far as finances but she is taking care of herself and is making good decisions at this point.  She clearly appears better with this visit than during the initial evaluation episode.  The patient continues to take Lexapro as part of her medication management and is diligent with her medications.  She has a device that dispenses her medicines for  her to make sure that they are taking properly.  There are other adaptations that have been made in her home to reduce any consequence of short-term memory issues such as having an electronic dog feeder so she does not overfeed her dog.  The patient was oriented today and mental status cognition appeared to be adequate although she has developed strategies to deal with lapses of memory etc. ? ?We have set up for repeat neuropsychological testing which will include the Wechsler Adult Intelligence Scale's, the Wechsler Memory Scale, controlled oral Word Association test and clock drawing test.  We will make direct comparisons to the evaluation that was conducted roughly 9 months ago.  Once his is completed and these direct comparisons made we will be able to provide a little bit more definitive diagnostic perspective and go into greater detail regarding treatment recommendations. ?

## 2021-10-22 ENCOUNTER — Telehealth: Payer: Self-pay | Admitting: Cardiovascular Disease

## 2021-10-22 ENCOUNTER — Ambulatory Visit: Payer: Medicare Other | Admitting: Podiatry

## 2021-10-22 ENCOUNTER — Ambulatory Visit (INDEPENDENT_AMBULATORY_CARE_PROVIDER_SITE_OTHER): Payer: Medicare Other

## 2021-10-22 ENCOUNTER — Telehealth: Payer: Self-pay | Admitting: Podiatry

## 2021-10-22 DIAGNOSIS — I4891 Unspecified atrial fibrillation: Secondary | ICD-10-CM

## 2021-10-22 DIAGNOSIS — I495 Sick sinus syndrome: Secondary | ICD-10-CM | POA: Diagnosis not present

## 2021-10-22 LAB — CUP PACEART INCLINIC DEVICE CHECK
Battery Remaining Longevity: 168 mo
Battery Voltage: 3.22 V
Brady Statistic AP VP Percent: 0.09 %
Brady Statistic AP VS Percent: 67.66 %
Brady Statistic AS VP Percent: 0.07 %
Brady Statistic AS VS Percent: 32.22 %
Brady Statistic RA Percent Paced: 60.8 %
Brady Statistic RV Percent Paced: 1.05 %
Date Time Interrogation Session: 20230509134441
Implantable Lead Implant Date: 20121023
Implantable Lead Implant Date: 20121023
Implantable Lead Location: 753859
Implantable Lead Location: 753860
Implantable Pulse Generator Implant Date: 20230424
Lead Channel Impedance Value: 1026 Ohm
Lead Channel Impedance Value: 1425 Ohm
Lead Channel Impedance Value: 361 Ohm
Lead Channel Impedance Value: 418 Ohm
Lead Channel Pacing Threshold Amplitude: 0.5 V
Lead Channel Pacing Threshold Amplitude: 2 V
Lead Channel Pacing Threshold Pulse Width: 0.4 ms
Lead Channel Pacing Threshold Pulse Width: 0.7 ms
Lead Channel Sensing Intrinsic Amplitude: 12 mV
Lead Channel Sensing Intrinsic Amplitude: 2.125 mV
Lead Channel Setting Pacing Amplitude: 1.5 V
Lead Channel Setting Pacing Amplitude: 3.5 V
Lead Channel Setting Pacing Pulse Width: 0.7 ms
Lead Channel Setting Sensing Sensitivity: 0.9 mV

## 2021-10-22 NOTE — Patient Instructions (Addendum)
? ?  After Your Pacemaker ? ? ?Monitor your pacemaker site for redness, swelling, and drainage. Call the device clinic at [phone] if you experience these symptoms or fever/chills. ? ?Your incision was closed with Steri-strips or staples:  You may shower and wash your incision with soap and water. Avoid lotions, ointments, or perfumes over your incision until it is well-healed. ? ?You may use a hot tub or a pool after your wound check appointment if the incision is completely closed. ? ?You may drive, unless driving has been restricted by your healthcare providers. ? ?Your Pacemaker is MRI compatible. ? ?Remote monitoring is used to monitor your pacemaker from home. This monitoring is scheduled every 91 days by our office. It allows us to keep an eye on the functioning of your device to ensure it is working properly. You will routinely see your Electrophysiologist annually (more often if necessary).  ?

## 2021-10-22 NOTE — Progress Notes (Signed)
Wound check appointment s/p PPM gen change 10/07/21. Steri-strips removed. Wound without redness or edema. Incision edges approximated, wound well healed. Normal device function. RA thresholds, sensing, and impedances consistent with implant measurements. Auto LV threshold previously ran at 0.4 ms and resulted in high RV output of 5.0V secondary to 2x safety margin programming. Today manual threshold ran at 0.7 ms and resulted 2.0V which is close to implant testing. RV threshold set to monitor only at 3.5V/0.49ms. Patient also has a history of AF with burden at 10.3%. 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 Poor rate control with AF episodes. No OAC secondary to fall risk. Histogram distribution appropriate for patient and level of activity. No high ventricular rates noted. Patient educated about wound care, arm mobility, lifting restrictions. Enrolled in remote monitoring with next transmission 01/07/22. 91 day follow up with Dr. Royann Shivers 01/13/22. Of note patient presented to appointment c/o high BP and HA. Daughter concerned enough that she gave patient a 25mg  atenolol. Not on EMR med list. Patient felt much better by end of device clinic appointment with BP 141/82.  ? ? ? ? ? ? ? ?

## 2021-10-22 NOTE — Telephone Encounter (Signed)
Pts daughter left message today @ 1253pm to cxl appt for today pt was at the heart doctor due to bp elevated. She will call to reschedule. ? ?I canceled the appt. ?

## 2021-10-22 NOTE — Telephone Encounter (Signed)
Pt c/o BP issue: STAT if pt c/o blurred vision, one-sided weakness or slurred speech ? ?1. What are your last 5 BP readings? 160/109 ?161/107 pulse 99  ? ?2. Are you having any other symptoms (ex. Dizziness, headache, blurred vision, passed out)? Headache  ? ?3. What is your BP issue? Hypertension ? ?Pt's daughter req call back before her wound care appt at 12 PM, but needs advice as to what to do.  ?

## 2021-10-22 NOTE — Telephone Encounter (Signed)
Called patient's daughter (DPR) back about her message. Patient complaining of a high BP and headaches. Patient's SBP was 180's, and daughter gave her atenolol 25 mg (not prescribed at this time) and now BP 160/109 HR 99. Will send message to Dr. [name] and Dr. Royann Shivers for advisement.  ?

## 2021-10-23 ENCOUNTER — Other Ambulatory Visit: Payer: Self-pay | Admitting: Cardiovascular Disease

## 2021-10-24 ENCOUNTER — Other Ambulatory Visit (HOSPITAL_COMMUNITY): Payer: Self-pay

## 2021-10-25 ENCOUNTER — Other Ambulatory Visit (HOSPITAL_COMMUNITY): Payer: Self-pay

## 2021-10-25 MED ORDER — DILTIAZEM HCL ER COATED BEADS 120 MG PO CP24
120.0000 mg | ORAL_CAPSULE | Freq: Every day | ORAL | 3 refills | Status: DC
  Filled 2021-10-25: qty 90, 90d supply, fill #0
  Filled 2022-01-20: qty 90, 90d supply, fill #1
  Filled 2022-04-17: qty 90, 90d supply, fill #2
  Filled 2022-07-17 – 2022-07-18 (×2): qty 30, 30d supply, fill #3
  Filled 2022-07-22 – 2022-07-23 (×2): qty 90, 90d supply, fill #3
  Filled 2022-07-23: qty 30, 30d supply, fill #3

## 2021-10-29 DIAGNOSIS — F0280 Dementia in other diseases classified elsewhere without behavioral disturbance: Secondary | ICD-10-CM

## 2021-10-29 DIAGNOSIS — G309 Alzheimer's disease, unspecified: Secondary | ICD-10-CM | POA: Diagnosis not present

## 2021-10-29 NOTE — Progress Notes (Signed)
? ?  Behavioral Observations ?The patient appeared well-groomed and appropriately dressed. Her manners were polite and appropriate to the situation. She initially demonstrated a negative attitude toward testing but still appeared to show good effort and by the end of the assessment her attitude was much more positive.    ? ?Neuropsychology Note ? ?Jacqueline Kim completed 120 minutes of neuropsychological testing with technician, Marica Otter, BA, under the supervision of Arley Phenix, PsyD., Clinical Neuropsychologist. The patient did not appear overtly distressed by the testing session, per behavioral observation or via self-report to the technician. Rest breaks were offered.  ? ?Clinical Decision Making: In considering the patient's current level of functioning, level of presumed impairment, nature of symptoms, emotional and behavioral responses during clinical interview, level of literacy, and observed level of motivation/effort, a battery of tests was selected by Dr. Kieth Brightly during initial consultation on 10/21/2021. This was communicated to the technician. Communication between the neuropsychologist and technician was ongoing throughout the testing session and changes were made as deemed necessary based on patient performance on testing, technician observations and additional pertinent factors such as those listed above. ? ?Tests Administered: ?Wechsler Adult Intelligence Scale, 4th Edition (WAIS-IV) ? ? ?Results: ? ?  ?Composite Score Summary  ?Scale Sum of ?Scaled Scores Composite ?Score Percentile ?Rank 95% Conf. ?Interval Qualitative Description  ?Verbal Comprehension 31 VCI 102 55 96-108 Average  ?Perceptual Reasoning 15 PRI 71 3 66-79 Borderline  ?Working Memory 14 WMI 83 13 77-91 Low Average  ?Processing Speed 6 PSI 62 1 57-74 Extremely Low  ?Full Scale 66 FSIQ 77 6 73-82 Borderline  ?General Ability 46 GAI 84 14 80-89 Low Average  ? ? ? ? ? ?Verbal Comprehension Subtests Summary  ?Subtest Raw Score  Scaled Score Percentile Rank Reference Group Scaled Score SEM  ?Similarities 28 13 84 11 1.12  ?Vocabulary 43 12 75 12 0.73  ?Information 7 6 9 6  0.73  ?(Comprehension) 27 13 84 12 1.08  ? ? ? ? ? ? ?Perceptual Reasoning Subtests Summary  ?Subtest Raw Score Scaled Score Percentile Rank Reference Group Scaled Score SEM  ?Block Design 6 3 1 2  1.27  ?Matrix Reasoning 8 8 25 4  0.73  ?Visual Puzzles 3 4 2 2  0.99  ?(Picture Completion) 5 7 16 4  1.12  ? ? ? ? ? ? ?Working English as a second language teacher  ?Subtest Raw Score Scaled Score Percentile Rank Reference Group Scaled Score SEM  ?Digit Span 17 6 9 4  0.79  ?Arithmetic 10 8 25 7  0.95  ? ? ? ? ? ? ?Processing Speed Subtests Summary  ?Subtest Raw Score Scaled Score Percentile Rank Reference Group Scaled Score SEM  ?Symbol Search 4 2 0.4 1 1.12  ?Coding 15 4 2 1  1.12  ? ? ? ? ? ? ? ?Feedback to Patient: ?Jacqueline Kim will return on 11/07/2021 to complete testing and again on 02/25/2022 for an interactive feedback session with Dr. Kieth Brightly at which time her test performances, clinical impressions and treatment recommendations will be reviewed in detail. The patient understands she can contact our office should she require our assistance before this time. ? ?120 minutes spent face-to-face with patient administering standardized tests, 30 minutes spent scoring (technician). [CPT [id number], [id number]] ? ?Full report to follow.  ?

## 2021-10-30 ENCOUNTER — Other Ambulatory Visit (HOSPITAL_COMMUNITY): Payer: Self-pay

## 2021-10-30 MED ORDER — CLOBETASOL PROPIONATE 0.05 % EX SOLN
1.0000 "application " | Freq: Two times a day (BID) | CUTANEOUS | 3 refills | Status: DC
  Filled 2021-10-30: qty 50, 25d supply, fill #0
  Filled 2021-12-01: qty 50, 25d supply, fill #1

## 2021-10-31 ENCOUNTER — Other Ambulatory Visit (HOSPITAL_COMMUNITY): Payer: Self-pay

## 2021-11-05 ENCOUNTER — Other Ambulatory Visit (HOSPITAL_COMMUNITY): Payer: Self-pay

## 2021-11-07 DIAGNOSIS — F0280 Dementia in other diseases classified elsewhere without behavioral disturbance: Secondary | ICD-10-CM

## 2021-11-07 DIAGNOSIS — G309 Alzheimer's disease, unspecified: Secondary | ICD-10-CM | POA: Diagnosis not present

## 2021-11-07 NOTE — Progress Notes (Signed)
Behavioral Observations The patient appeared well-groomed and appropriately dressed. Her manners were polite and appropriate to the situation. The patient's attitude toward testing was negative but she complied with all testing instructions and demonstrated fair effort. The patient required minimal repetition and prompting to complete sub-tests. The patient was easily able to understand instructions and did not require further explanation. The patient's concentration and understanding of testing instructions was significantly improved from initial testing in 2022.    Neuropsychology Note  Jacqueline Kim completed 60 minutes of neuropsychological testing with technician, Dina Rich, BA, under the supervision of Ilean Skill, PsyD., Clinical Neuropsychologist. The patient did not appear overtly distressed by the testing session, per behavioral observation or via self-report to the technician. Rest breaks were offered.   Clinical Decision Making: In considering the patient's current level of functioning, level of presumed impairment, nature of symptoms, emotional and behavioral responses during clinical interview, level of literacy, and observed level of motivation/effort, a battery of tests was selected by Dr. Sima Matas during initial consultation on 10/21/2021. This was communicated to the technician. Communication between the neuropsychologist and technician was ongoing throughout the testing session and changes were made as deemed necessary based on patient performance on testing, technician observations and additional pertinent factors such as those listed above.  Tests Administered: Controlled Oral Word Association Test (COWAT; FAS & Animals)  Wechsler Memory Scale, 4th Edition (WMS-IV); Adult Battery or Older Adult Battery   Results:  COWAT FAS Total = 36 Z =.032 Animals Total = 11 Z = -1.26      WMS-IV Older Adult  Index Score Summary  Index Sum of Scaled Scores Index Score  Percentile Rank 95% Confidence Interval Qualitative Descriptor  Auditory Memory (AMI) 26 80 9 75-87 Low Average  Visual Memory (VMI) 3 45 <0.1 42-52 Extremely Low  Immediate Memory (IMI) 15 69 2 64-77 Extremely Low  Delayed Memory (DMI) 14 67 1 62-77 Extremely Low      Primary Subtest Scaled Score Summary  Subtest Domain Raw Score Scaled Score Percentile Rank  Logical Memory I AM 17 6 9   Logical Memory II AM 7 6 9   Verbal Paired Associates I AM 14 8 25   Verbal Paired Associates II AM 3 6 9   Visual Reproduction I VM 5 1 0.1  Visual Reproduction II VM 0 2 0.4  Symbol Span VWM 4 4 2       Auditory Memory Process Score Summary  Process Score Raw Score Scaled Score Percentile Rank Cumulative Percentage (Base Rate)  LM II Recognition 21 - - >75%  VPA II Recognition 25 - - 17-25%       Visual Memory Process Score Summary  Process Score Raw Score Scaled Score Percentile Rank Cumulative Percentage (Base Rate)  VR II Recognition 3 - - 26-50%      ABILITY-MEMORY ANALYSIS  Ability Score:  VCI: 102 Date of Testing:  WAIS-IV; WMS-IV 2021/10/29  Predicted Difference Method   Index Predicted WMS-IV Index Score Actual WMS-IV Index Score Difference Critical Value  Significant Difference Y/N Base Rate  Auditory Memory 101 80 21 9.39 Y 5-10%  Visual Memory 101 45 56 8.28 Y <1%  Immediate Memory 101 69 32 10.49 Y <1%  Delayed Memory 101 67 34 12.08 Y <1%  Statistical significance (critical value) at the .01 level.       Feedback to Patient: Jacqueline Kim will return on 02/25/2022 for an interactive feedback session with Dr. Sima Matas at which time her test performances, clinical impressions  and treatment recommendations will be reviewed in detail. The patient understands she can contact our office should she require our assistance before this time.  60 minutes spent face-to-face with patient administering standardized tests, 30 minutes spent scoring Environmental education officer). [CPT  [id number], [id number]  Full report to follow.

## 2021-11-15 ENCOUNTER — Other Ambulatory Visit (HOSPITAL_COMMUNITY): Payer: Self-pay

## 2021-11-15 MED ORDER — HYDROCODONE-ACETAMINOPHEN 5-325 MG PO TABS
1.0000 | ORAL_TABLET | Freq: Four times a day (QID) | ORAL | 0 refills | Status: DC | PRN
  Filled 2021-11-15: qty 120, 30d supply, fill #0

## 2021-11-19 ENCOUNTER — Ambulatory Visit: Payer: Medicare Other | Admitting: Cardiovascular Disease

## 2021-11-23 ENCOUNTER — Other Ambulatory Visit: Payer: Self-pay | Admitting: Cardiovascular Disease

## 2021-11-26 ENCOUNTER — Other Ambulatory Visit (HOSPITAL_COMMUNITY): Payer: Self-pay

## 2021-11-26 MED ORDER — DIGOXIN 62.5 MCG PO TABS
1.0000 | ORAL_TABLET | ORAL | 6 refills | Status: DC
  Filled 2021-11-26: qty 15, 30d supply, fill #0
  Filled 2021-12-22: qty 15, 30d supply, fill #1
  Filled 2022-01-20: qty 15, 30d supply, fill #2
  Filled 2022-02-22: qty 15, 30d supply, fill #3

## 2021-11-28 ENCOUNTER — Other Ambulatory Visit (HOSPITAL_COMMUNITY): Payer: Self-pay

## 2021-12-01 ENCOUNTER — Other Ambulatory Visit (HOSPITAL_COMMUNITY): Payer: Self-pay

## 2021-12-02 ENCOUNTER — Other Ambulatory Visit (HOSPITAL_COMMUNITY): Payer: Self-pay

## 2021-12-02 MED ORDER — LEVOTHYROXINE SODIUM 112 MCG PO TABS
112.0000 ug | ORAL_TABLET | Freq: Every day | ORAL | 0 refills | Status: DC
  Filled 2021-12-02 – 2022-01-20 (×4): qty 30, 30d supply, fill #0

## 2021-12-03 ENCOUNTER — Other Ambulatory Visit (HOSPITAL_COMMUNITY): Payer: Self-pay

## 2021-12-04 ENCOUNTER — Other Ambulatory Visit (HOSPITAL_COMMUNITY): Payer: Self-pay

## 2021-12-05 ENCOUNTER — Telehealth: Payer: Self-pay

## 2021-12-05 NOTE — Telephone Encounter (Signed)
Daughter of patient called stating that Dr. Kieth Brightly was suppose to refer patient to [facility]rology and they haven't heard anything. I do not see a referral under referrals. Can you please look into this?

## 2021-12-14 ENCOUNTER — Other Ambulatory Visit (HOSPITAL_COMMUNITY): Payer: Self-pay

## 2021-12-16 ENCOUNTER — Other Ambulatory Visit (HOSPITAL_COMMUNITY): Payer: Self-pay

## 2021-12-20 ENCOUNTER — Other Ambulatory Visit (HOSPITAL_COMMUNITY): Payer: Self-pay

## 2021-12-20 MED ORDER — HYDROCODONE-ACETAMINOPHEN 5-325 MG PO TABS
1.0000 | ORAL_TABLET | Freq: Four times a day (QID) | ORAL | 0 refills | Status: DC | PRN
  Filled 2021-12-20: qty 120, 30d supply, fill #0

## 2021-12-23 ENCOUNTER — Other Ambulatory Visit (HOSPITAL_COMMUNITY): Payer: Self-pay

## 2021-12-31 ENCOUNTER — Ambulatory Visit (INDEPENDENT_AMBULATORY_CARE_PROVIDER_SITE_OTHER): Payer: Medicare Other | Admitting: Cardiovascular Disease

## 2021-12-31 ENCOUNTER — Encounter: Payer: Self-pay | Admitting: Cardiovascular Disease

## 2021-12-31 ENCOUNTER — Other Ambulatory Visit (HOSPITAL_COMMUNITY): Payer: Self-pay

## 2021-12-31 DIAGNOSIS — I480 Paroxysmal atrial fibrillation: Secondary | ICD-10-CM

## 2021-12-31 DIAGNOSIS — I495 Sick sinus syndrome: Secondary | ICD-10-CM

## 2021-12-31 DIAGNOSIS — M069 Rheumatoid arthritis, unspecified: Secondary | ICD-10-CM | POA: Diagnosis not present

## 2021-12-31 DIAGNOSIS — I10 Essential (primary) hypertension: Secondary | ICD-10-CM | POA: Diagnosis not present

## 2021-12-31 DIAGNOSIS — Z79899 Other long term (current) drug therapy: Secondary | ICD-10-CM

## 2021-12-31 DIAGNOSIS — I4891 Unspecified atrial fibrillation: Secondary | ICD-10-CM

## 2021-12-31 NOTE — Patient Instructions (Signed)
Medication Instructions:  Your physician recommends that you continue on your current medications as directed. Please refer to the Current Medication list given to you today.  *If you need a refill on your cardiac medications before your next appointment, please call your pharmacy*   Follow-Up: At [facility], you and your health needs are our priority.  As part of our continuing mission to provide you with exceptional heart care, we have created designated Provider Care Teams.  These Care Teams include your primary Cardiologist (physician) and Advanced Practice Providers (APPs -  Physician Assistants and Nurse Practitioners) who all work together to provide you with the care you need, when you need it.  We recommend signing up for the patient portal called "MyChart".  Sign up information is provided on this After Visit Summary.  MyChart is used to connect with patients for Virtual Visits (Telemedicine).  Patients are able to view lab/test results, encounter notes, upcoming appointments, etc.  Non-urgent messages can be sent to your provider as well.   To learn more about what you can do with MyChart, go to [email or web address].    Your next appointment:   7 month(s)  The format for your next appointment:   In Person  Provider:   Nicki Guadalajara, MD

## 2021-12-31 NOTE — Progress Notes (Signed)
Patient ID: Jacqueline Kim, female   DOB: 08-Mar-1946, 76 y.o.   MRN: [id number]     HPI: Jacqueline Kim is a 76 y.o. female  who presents for a 13 month follow-up cardiologic evaluation.  Jacqueline Kim has known history of sinus node dysfunction. She has history of paroxysmal atrial fibrillation. She is status post permanent pacemaker implanted on 04/08/2011 Medtronic Revo MRI compatible pacemaker. She also has a history of hypothyroidism. She has had recurrent episodes of paroxysmal atrial fibrillation and has been on pradaxa anticoagulation for several years after initially having been on warfarin.  She has been maintained on rhythmol SR 325 mg twice a day and atenolol 50 mg daily.  She also is on losartan 50 mg for hypertension.  She was hospitalized in January 2014 with recurrent AF in the setting of significant increased family stress. She is now separated and living in a different household from her husband Dr. Suzie Kim.  She has had several episodes of recurrent AF and had an episode on 03/12/2013 which lasted for approximately 12 hours.  On the Saturday before Easter 2015 when she was rushing with family members and trying to prepare dinner she was hospitalized and presented with a heart rate at 150.  She was started on IV Cardizem for rate control.  She spontaneously cardioverted to sinus rhythm on April 6 prior to a planned TEE cardioversion.  I saw her in followup of that hospitalization and suggested an EP evaluation.  She saw [name] for discussion concerning atrial fibrillation ablation.  Apparently, at that time, the patient was feeling well. She opted against pursuing AF ablation presently.    A followup echo Doppler study on 10/24/2013 showed an ejection fraction of 60-65% and there was evidence for grade 1 diastolic function without wall motion abnormalities.  Her left atrial dimension was normal.    She is followed by [name] for her pacemaker.  She was hospitalized in early  February 2016 following a prolonged episode of atrial fibrillation with rapid ventricular response for which she was symptomatic.  She underwent her initial cardioversion on 07/27/2014.  When she saw [name] on August 08, 2014 interrogation of her pacemaker showed a 28 hour episode of atrial fibrillation without recurrence since the cardioversion.  She again developed a recurrent episode of atrial fibrillation on 08/30/2014 which led to an emergency room evaluation.  I last saw her in March.  I recommended follow-up evaluation with [name] and reconsideration of possible atrial fibrillation ablation.  She saw [name] at that time admitted that she was drinking wine on a daily basis.  Rather than pursue ablation she elected to reduce her EtOH intake.  She feels that she has done well and has felt one episode of palpitations during her period of increased stress that she is aware of since her last evaluation..  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  She denies exertional chest pain, shortness of breath, orthopnea, PND, presyncope or syncope or any neurologic sequelae.  When she met with [name].  There was no discussion of changing her anti-rhythmic therapy.    She underwent left knee surgery by [name].  She also fell on December 20, 2014 in the shower and was hospitalized for 2 days.  She developed a cellulitis and buttock abscess, which required anabiotic therapy.    She  lives at [facility].  She denies any episodes of  chest pain.  At times she notes some shortness of breath with activity.  She saw [name] in October 2017 and her pacemaker was functioning normally.  She did not have any recurrent atrial fibrillation and had only 2 brief episodes of PAT which combined last a total of 54 seconds.  She states she had stopped taking the losartan, which had been 50 mg because that time, she noted some lightheadedness unassociated with any  rhythm disturbance.  In August 2017, a CT of her abdomen was performed which did not explain some of her issues at the time of rectal bleeding and constipation.  She was found to have mild colonic diverticulosis without diverticulitis.  Incidentally, she was noted to have abdominal aortic atherosclerosis.    When I saw her in 2018, she was hypertensive and I recommended reinstitution of losartan at least 25 mg depending upon her blood pressure, but if necessary to increase to 25 mg 3 times a day.  Since I last saw her in May 2018, she underwent an echo Doppler study which showed an EF of 55-60%.  She had normal diastolic parameters.  There was mild mitral regurgitation, mild left atrial dilation, and mild pulmonary hypertension with a PA peak pressure 38 mm.  She saw [name] on 11/13/2016 and her losartan dose was reduced to 25 mg daily.  She sees Dr. Naida Sleight for rheumatoid arthritis.  She is status post left knee replacement and tolerated this well.  Her primary physician is [name].    She has had issues with scoliosis and in the future may require possible surgery.  At times she has noticed some dragging of her left leg.  She is unaware of any recurrent episodes of atrial fibrillation.  Her last echo Doppler study in May 2018 showed an EF of 55-60%.  She had mild mitral regurgitation.  There was mild pulmonary hypertension with a PA pressure 38.  She had normal diastolic parameters.   She saw [name] on February 12, 2018.  At that time, interrogation of her pacemaker revealed one 3-minute episode in the last 6 months of recurrent PAF occurred on Oct 28, 2017.  However, she comes to the office today and states that last week sleeping she was awakened with a chest fluttering and was concerned that she was back in atrial fibrillation for questionable duration.   I  saw her in October 2019 and at that time she was she  there with her daughter.  She appeared much more frail.  Due to chronic back  discomfort, she was walking with assistance.  She had moved to a lower ground apartment from her second floor unit at [facility]. She  had issues with dizziness and has fallen on a couple occasions.  She was on atenolol 25 mg twice a day, propofenone150 mg tablets  1-1/2 in the morning, 1 at midday, 1-1/2 in the evening.  She was on Cardizem CD 120 mg at bedtime.  She was on Eliquis for anticoagulation.  According to the family members she was noted to have significant snoring during sleep and was often waking up in the middle the night with possible gasping for air.  She denies any recent bruises particularly following her fall.  She is on levothyroxine for hypothyroidism.  During that evaluation, she was orthostatic.  She was 100% atrially placed.  I reduced her atenolol down to 12.5 mg.  I also recommended that she reduce her Lexapro and recommended 20 to 30 mm compression  stockings.  Due to continued episodes her atenolol was ultimately discontinued altogether.  I scheduled her for a sleep study to assess for sleep apnea.  Her sleep study was done on May 08, 2018 and was positive for sleep apnea with an overall AHI at 13.4/h.  Events were worse and sleep apnea was moderate during REM sleep with an AHI of 22.5/h.  She had significant oxygen desaturation to 80%.    When I  saw her in the office on June 28, 2018 she had not yet had her CPAP titration.  However, during that evaluation she appeared exceedingly frail.  She had significant orthostatic hypotension and I significantly reduced her medications.  I had reduced her trazodone initially from 150 mg down to 75 mg and then subsequently decrease this to 37.5 mg.  In addition I completely discontinued her furosemide.  She again is now wearing support stockings and she denies any orthostatic symptoms.  She underwent her CPAP titration trial on June 30, 2018 and CPAP was titrated up to 14 cm of water with still events.  It was recommended that she  initiate CPAP auto therapy with a minimum of 14 up to 20 cm water pressure if needed.   I evaluated her by a telemedicine visit in April 2020.  Her CPAP set up date was August 10, 2018 with [facility].  I was able to obtain a download of her use.  She apparently only initially used it for short-term and of the first month only 9 out of 30 days of use with an average use at 1 hour.  Her 95th percentile pressure with minimal use was 14.3 cm and her AHI remained 4.4/h.  She felt that the mask was causing her to have significant neck difficulties.  As result, she has not used CPAP since. At the time of that evaluation she was unaware of episodes of dizziness.  She does not have a blood pressure monitor at home and cannot take her blood pressure but she feels that she is able to go from sitting to standing position without getting dizzy.  She wasunaware of any recurrent episodes of obvious atrial fibrillation.  She denies chest pain.  She denies bleeding on anticoagulation.   She was reevaluated in a telemedicine visit in July 2020.  At that time she continued  continued to have issues with low blood pressure.  Apparently she was evaluated by Dr. Rexene Alberts for her sensory polyneuropathy and lower extremity weakness.  He will be undergoing EMG and nerve conduction studies.  There was concerned that she was drinking wine in excess.  Since I last evaluated her, she has not been using CPAP.  She denies any awareness of recurrent atrial fibrillation.  Her blood pressure has tended to be low.    She was evaluated by [name] in all 2020 for pacemaker evaluation.  There were no recurrent episodes of atrial fibrillation on propafenone.  It was felt that her risks of anticoagulation exceeded the potential benefit particularly she was maintaining sinus rhythm.  Normal pacemaker device function.  Her blood pressure was controlled at that time.  I  saw her in January 2021 and since her prior evaluation she had  significantly Kim up wine.  She has had issues with right leg pruritus.  She was unaware of recurrent atrial fibrillation or chest pain.  Her CPAP machine had been returned.  She was taking midodrine 2.5 mg twice a day, and diltiazem 120 mg at bedtime.  She  is no longer taking atenolol but has a prescription and has this as a as needed basis.  She continues to be on propafenone.  She is on trazodone 37.5 mg daily.  She is here in the office today with her daughter.  I saw her in follow-up in April 2021 at which time she felt improved.  She is living on the ground floor at [facility].  She has more energy.  She is undergoing physical therapy 2 times per week.  She denies any significant symptomatic orthostatic symptoms and is no longer taking midodrine.  She is unaware of any cardiac arrhythmias.  She has not required as needed atenolol.  She was no longer on midodrine so she had a mild systolic drop in blood pressure from 1 38-1 18 going from supine to standing she was asymptomatic and felt well.  She had been seen by [name] for pacemaker check and had a low burden of atrial fibrillation at 0.3% predominantly due to a single 10-hour episode of A. fib which occurred on February 19.  I saw her in May 15, 2020 at which time she continued to feel well and had more strength.  Unfortunately recently she had ripped and fell  and hit her head. She  went to the emergency room on May 02 2020 and required stitches.  She recently had a pacemaker check by Dr. Eleanora Neighbor was noted to have occasional paroxysmal episodes of atrial fibrillation usually brief but 1 episode up to 4 hours and overall burden 3.5%.  Atrial fibrillation rate control was poor during her episode of A. fib.    She was evaluated by [name] in April 2022.  It was felt that her overall burden of atrial fibrillation was higher over the last year but relatively stable at about 5%.  Her rate control was fair and no additional AV  nodal blocking agents were recommended due to her problems with orthostatic hypotension.  At the time she had over 80% atrially paced beats.  I last saw her on November 19, 2020 at which time she was improved and continues to gain more strength.  P  She lives on a downstairs floor.  She is scheduled to see her primary physician Dr. Gayla Doss and will be undergoing laboratory.  She denies any chest pain.  Her previous orthostatic symptomatology has significantly improved.   Since I last saw her, she underwent dual-chamber pacemaker generator change out by [name] oh on October 07, 2021.  He continues to have remote pacemaker checks.  Presently, she feels well.  She has more energy.  She is no longer drinking alcohol and felt well.  She continues to be on amiodarone 200 mg daily, digoxin 0.0625 mg every other day and Cartia XT 120 at bedtime.  She is on Lexapro for anxiety.  She continues to be on hydroxychloroquine for rheumatoid arthritis.  She is here with her daughter.  Past Medical History:  Diagnosis Date   Anticoagulant long-term use  WITH PRADAXA 05/27/2011   Anxiety    Cellulitis of buttock, left 88/50/2774   Complication of anesthesia    DIFFICULTY WAKING UP   Depression    Diverticulosis    DJD (degenerative joint disease)    Esophagitis 09/17/06   External hemorrhoids    GERD (gastroesophageal reflux disease)    Glaucoma    History of stress test    a. 2008 - normal nuc.   Hypertension    Hypothyroid    IBS (irritable bowel  syndrome)    Paroxysmal A-fib ([place]) diagnosed 2008   a. On propafenone, Pradaxa.   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   s/p MDT PPM by Dr Recardo Evangelist    Past Surgical History:  Procedure Laterality Date   BREAST REDUCTION SURGERY     BUNIONECTOMY Right 07/09/2015   with rods and pins   CARDIOVERSION N/A 08/02/2014   Procedure: CARDIOVERSION;  Surgeon: Sueanne Margarita, MD;  Location:  [place];  Service: Cardiovascular;  Laterality: N/A;   CATARACT EXTRACTION, BILATERAL Bilateral    FACIAL COSMETIC SURGERY     FOOT SURGERY Left    HAND SURGERY     multiple   LIPOSUCTION     NM MYOCAR PERF WALL MOTION  12/29/2006   No significant ischemia; EF 69%   OVARIAN CYST REMOVAL     PACEMAKER INSERTION  04/08/11   MDT Revo implanted by Dr Recardo Evangelist   PPM GENERATOR CHANGEOUT N/A 10/07/2021   Procedure: PPM GENERATOR CHANGEOUT;  Surgeon: Sanda Klein, MD;  Location: [place] [facility];  Service: Cardiovascular;  Laterality: N/A;   TONSILLECTOMY     TOTAL KNEE ARTHROPLASTY Left 05/01/2015   Procedure: TOTAL LEFT KNEE ARTHROPLASTY;  Surgeon: Paralee Cancel, MD;  Location: [facility];  Service: Orthopedics;  Laterality: Left;   VAGINAL HYSTERECTOMY      Allergies  Allergen Reactions   Azithromycin Other (See Comments)    Causes heart rhythm issues    Epinephrine Other (See Comments)    Causes A-Fib    Penicillins Anaphylaxis    Has patient had a PCN reaction causing immediate rash, facial/tongue/throat swelling, SOB or lightheadedness with hypotension: Yes Has patient had a PCN reaction causing severe rash involving mucus membranes or skin necrosis: No Has patient had a PCN reaction that required hospitalization No Has patient had a PCN reaction occurring within the last 10 years: No If all of the above answers are "NO", then may proceed with Cephalosporin use.    Lidocaine Palpitations   Metoprolol Other (See Comments)    alopecia     Current Outpatient Medications  Medication Sig Dispense Refill   amiodarone (PACERONE) 200 MG tablet Take 1 tablet (200 mg total) by mouth daily. 90 tablet 1   clobetasol (TEMOVATE) 0.05 % external solution Apply 1 application topically 2  times daily to affected area(s) as needed, Do not apply to face,groin or armpit 50 mL 3   Digoxin 62.5 MCG TABS Take 1 tablet by mouth every other day. 15 tablet 6   diltiazem (CARTIA XT) 120 MG 24 hr  capsule Take 1 capsule (120 mg total) by mouth at bedtime. Keep upcoming appointment for future refills. 90 capsule 3   escitalopram (LEXAPRO) 20 MG tablet Take 1 tablet (20 mg total) by mouth daily. 90 tablet 3   HYDROcodone-acetaminophen (NORCO/VICODIN) 5-325 MG tablet Take 1 tablet by mouth every 6 (six) hours as needed. 120 tablet 0   HYDROcodone-acetaminophen (NORCO/VICODIN) 5-325 MG tablet Take 1 tablet by mouth every 6 (six) hours as needed. 120 tablet 0   hydroxychloroquine (PLAQUENIL) 200 MG tablet Take 1.5 tablets (300 mg total) by mouth daily with food or milk (Patient taking differently: Take 200 mg by mouth daily.) 45 tablet 2   levothyroxine (SYNTHROID) 112 MCG tablet Take 1 tablet (112 mcg total) by mouth daily. 30 tablet 0   nitrofurantoin (MACRODANTIN) 50 MG capsule Take 1 capsule (50 mg total) by mouth daily. 30 capsule  6   triamcinolone cream (KENALOG) 0.1 % Apply 1 application to affected area(s) up to 2 (two) times daily as needed. Do not apply to face, groin, or armpit. 454 g 3   traZODone (DESYREL) 150 MG tablet Take 0.5 tablets (75 mg total) by mouth once a day in the evening. 30 tablet 6   No current facility-administered medications for this visit.    Social History   Socioeconomic History   Marital status: Divorced    Spouse name: Not on file   Number of children: 3   Years of education: Not on file   Highest education level: Not on file  Occupational History   Occupation: retired    Fish farm manager: RETIRED  Tobacco Use   Smoking status: Former    Types: Cigarettes    Quit date: 08/30/1996    Years since quitting: 25.3   Smokeless tobacco: Never  Substance and Sexual Activity   Alcohol use: Yes    Alcohol/week: 14.0 standard drinks of alcohol    Types: 14 Glasses of wine per week    Comment: daily   Drug use: No   Sexual activity: Yes  Other Topics Concern   Not on file  Social History Narrative   Lives in [place],  Former Engineer, structural,   Separated.     Social Determinants of Health   Financial Resource Strain: Not on file  Food Insecurity: Not on file  Transportation Needs: Not on file  Physical Activity: Not on file  Stress: Not on file  Social Connections: Not on file  Intimate Partner Violence: Not on file    Family History  Problem Relation Age of Onset   Anesthesia problems Mother    Colon polyps Mother        beign   Colon cancer Neg Hx    Dementia Neg Hx    ROS General: Negative; No fevers, chills, or night sweats;  HEENT: Negative; No changes in vision or hearing, sinus congestion, difficulty swallowing Pulmonary: Negative; No cough, wheezing, shortness of breath, hemoptysis Cardiovascular:  See HPI GI: Positive for irritable bowel syndrome for which she takes Bentyl before meals;  GU: Negative; No dysuria, hematuria, or difficulty voiding Musculoskeletal: Positive for rheumatoid arthritis, and scoliosis. Hematologic/Oncology: Negative; no easy bruising, bleeding Endocrine: Negative; no heat/cold intolerance; no diabetes Neuro: Negative; no changes in balance, headaches Skin: Negative; No rashes or skin lesions Psychiatric: There is less stress and anxiety; No behavioral problems, depression Sleep: Positive for recently diagnosed OSA.  Stop using CPAP therapy.  PE BP (!) 106/58   Pulse 64   Ht 5' 3" (1.6 m)   Wt 122 lb 9.6 oz (55.6 kg)   SpO2 97%   BMI 21.72 kg/m    Repeat blood pressure 120/60 supine and 104-106/66 standing  Wt Readings from Last 3 Encounters:  12/31/21 122 lb 9.6 oz (55.6 kg)  10/07/21 123 lb (55.8 kg)  09/23/21 125 lb (56.7 kg)   General: Alert, oriented, no distress.  Skin: normal turgor, no rashes, warm and dry HEENT: Normocephalic, atraumatic. Pupils equal round and reactive to light; sclera anicteric; extraocular muscles intact; Fundi ** Nose without nasal septal hypertrophy Mouth/Parynx benign; Mallinpatti scale Neck: No JVD, no carotid bruits; normal carotid  upstroke Lungs: clear to ausculatation and percussion; no wheezing or rales Chest wall: without tenderness to palpitation Heart: PMI not displaced, RRR, s1 s2 normal, 1/6 systolic murmur, no diastolic murmur, no rubs, gallops, thrills, or heaves Abdomen: soft, nontender; no hepatosplenomehaly, BS+; abdominal  aorta nontender and not dilated by palpation. Back: no CVA tenderness Pulses 2+ Musculoskeletal: full range of motion, normal strength, no joint deformities Extremities: no clubbing cyanosis or edema, Homan's sign negative  Neurologic: grossly nonfocal; Cranial nerves grossly wnl Psychologic: Normal mood and affect   December 31, 2021 ECG (independently read by me): Atrial paced, LAHB, LVH,  November 19, 2020 ECG (independently read by me): Atrially paced rhythm at 62 bpm.  Prolonged AV conduction with PR interval of 312 ms, left anterior hemiblock.  QS complex V1 to V3.  QTc interval 464 ms.  October 03, 2019 ECG (independently read by me): Atrially paced rhythm with prolonged AV conduction with PR interval of 292 ms; QTc interval 458 ms    July 13, 2019 ECG (independently read by me): Atrially paced rhythm at 61 bpm.  Prolonged AV conduction with PR interval to 288 ms.  No ectopy  January 2020 ECG (independently read by me): Lee paced rhythm with prolonged AV conduction.  Ventricular rate 65 bpm.  PR interval 248 ms.  No ectopy.  October 2019 ECG (independently read by me): Atrially paced rhythm with prolonged AV conduction with PR interval 264 ms.  Ventricular rate 64 bpm.  QTc interval 478 ms.  May 2019 ECG (independently read by me): Atrially paced rhythm at 62 bpm with prolonged AV conduction.  PR interval _0 ms.  QTc interval _1 ms.  November 2018 ECG (independently read by me): atrially paced rhythm with prolonged AV conduction with PR interval at 264 ms.  PAC.  QTc interval increase that 503 ms.  May 2018 ECG (independently read by me): Atrially paced rhythm at 69 bpm.   Prolonged AV conduction with PR interval 266 milliseconds.  Increased QTc interval 497 ms.  August 2016 ECG (independently read by me): Atrially paced rhythm at 66 bpm.  PR interval 232 ms.  QTc interval normal at 448 ms.  ECG (independently read by me): Atrially paced rhythm at 64 bpm.  No AF.  QTC 435 ms.  December 2015 ECG (independently read by me): Atrially paced rhythm at 65 with prolonged AV conduction with a PR interval at 270 ms.  No significant ST segment changes.   June 2015 ECG (independently read by me): Atrially paced rhythm at 62 beats per minute with prolonged AV conduction with a PR interval of 244 ms.  QTc interval 444 ms.  Prior 09/22/2013 ECG (independently read by me): Atrial paced rhythm at 63 beats per minute with ventricular sensing.   LABS:     Latest Ref Rng & Units 09/19/2021   11:28 AM 08/06/2020    3:13 AM 08/04/2020    3:37 PM  BMP  Glucose 70 - 99 mg/dL 96  103  100   BUN 8 - 23 mg/dL _2 Creatinine 0.44 - 1.00 mg/dL 0.76  0.78  1.10   Sodium 135 - 145 mmol/L 132  136  132   Potassium 3.5 - 5.1 mmol/L 4.3  3.2  4.3   Chloride 98 - 111 mmol/L 100  105  99   CO2 22 - 32 mmol/L _3 Calcium 8.9 - 10.3 mg/dL 9.3  8.6  9.3        Latest Ref Rng & Units 09/19/2021   11:28 AM 08/04/2020    3:37 PM 03/05/2020    4:20 PM  Hepatic Function  Total Protein 6.5 - 8.1 g/dL 6.1  7.4  6.6   Albumin 3.5 - 5.0 g/dL 3.7  4.2  3.8   AST 15 - 41 U/L 24  25  54   ALT 0 - 44 U/L 16  16  54   Alk Phosphatase 38 - 126 U/L 46  60  71   Total Bilirubin 0.3 - 1.2 mg/dL 0.5  1.0  0.8        Latest Ref Rng & Units 09/30/2021    1:35 PM 08/06/2020    3:13 AM 08/05/2020    8:33 AM  CBC  WBC 3.4 - 10.8 x10E3/uL 5.2  8.9  10.3   Hemoglobin 11.1 - 15.9 g/dL 12.3  12.1  11.9   Hematocrit 34.0 - 46.6 % 36.1  37.0  35.6   Platelets 150 - 450 x10E3/uL 274  187  187    Lab Results  Component Value Date   MCV 92 09/30/2021   MCV 95.1 08/06/2020   MCV 94.2  08/05/2020    Lab Results  Component Value Date   TSH 0.622 09/19/2021   Lab Results  Component Value Date   HGBA1C 5.3 04/01/2011    BNP    Component Value Date/Time   PROBNP 1,110.0 (H) 09/18/2013 0332    Lipid Panel     Component Value Date/Time   CHOL  12/19/2006 0315    172        ATP III CLASSIFICATION:  <200     mg/dL   Desirable  200-239  mg/dL   Borderline High  >=240    mg/dL   High   TRIG 293 (H) 12/19/2006 0315   HDL 37 (L) 12/19/2006 0315   CHOLHDL 4.6 12/19/2006 0315   VLDL 59 (H) 12/19/2006 0315   LDLCALC  12/19/2006 0315    76        Total Cholesterol/HDL:CHD Risk Coronary Heart Disease Risk Table                     Men   Women  1/2 Average Risk   3.4   3.3     RADIOLOGY: No results found.  IMPRESSION:  1. Essential hypertension   2. Paroxysmal atrial fibrillation (HCC)   3. Sinus node dysfunction (HCC)   4. Rheumatoid arthritis, involving unspecified site, unspecified whether rheumatoid factor present ([place])   5. Long term current use of antiarrhythmic drug     ASSESSMENT AND PLAN: Ms. Natallia Stellmach is a 76 year old Caucasian female who has a history of hypertension, PAF and is status post permanent pacemaker for sinus node dysfunction.  She previously was anticoagulated and is no longer on anticoagulation due to fall risk.  She had lost significant weigh.  Remotely, she had developed significant orthostatic symptomatology attributed by some of her medications.  She is followed by Dr. Robb Matar and has a history of PAF a short duration.  Since her last evaluation with me, due to pacemaker battery reaching RRT  she underwent dual-chamber generator change out by [name] oh on October 07, 2021 with a history of tachybradycardia syndrome with sick sinus syndrome and atrial fibrillation.  [place], she is doing well and is no longer having major orthostatic drop off some of her previous medications.  Her blood pressure today is stable on  Kirtida XT 120 daily and her ECG shows an atrially paced rhythm at 64 bpm on Cartia XT 120 mg daily, amiodarone 200 mg in addition to digoxin 0.0625 mg.  She is on levothyroxine 112 mcg  for hypothyroidism.  She takes Lexapro 20 mg daily for anxiety.  She continues to be on hydroxychloroquine for her rheumatoid arthritis.  Clinically she is stable.  She is no longer drinking alcohol.  She lives on the downstairs floor.  She will continue to have remote pacemaker checks and will be seeing [name] at the end of July for follow-up evaluation.  I will see her in February 2023 for reassessment.    Troy Sine, MD, [facility]  01/05/2022 4:56 PM

## 2022-01-01 ENCOUNTER — Encounter: Payer: Medicare Other | Attending: Psychology | Admitting: Psychology

## 2022-01-01 ENCOUNTER — Encounter: Payer: Self-pay | Admitting: Psychology

## 2022-01-01 DIAGNOSIS — I679 Cerebrovascular disease, unspecified: Secondary | ICD-10-CM | POA: Diagnosis not present

## 2022-01-01 DIAGNOSIS — I480 Paroxysmal atrial fibrillation: Secondary | ICD-10-CM | POA: Diagnosis not present

## 2022-01-01 DIAGNOSIS — I495 Sick sinus syndrome: Secondary | ICD-10-CM | POA: Diagnosis not present

## 2022-01-01 DIAGNOSIS — Z950 Presence of cardiac pacemaker: Secondary | ICD-10-CM | POA: Insufficient documentation

## 2022-01-01 DIAGNOSIS — Z5181 Encounter for therapeutic drug level monitoring: Secondary | ICD-10-CM | POA: Diagnosis not present

## 2022-01-01 DIAGNOSIS — Z79899 Other long term (current) drug therapy: Secondary | ICD-10-CM | POA: Diagnosis not present

## 2022-01-01 DIAGNOSIS — F0280 Dementia in other diseases classified elsewhere without behavioral disturbance: Secondary | ICD-10-CM | POA: Insufficient documentation

## 2022-01-01 DIAGNOSIS — H353133 Nonexudative age-related macular degeneration, bilateral, advanced atrophic without subfoveal involvement: Secondary | ICD-10-CM | POA: Insufficient documentation

## 2022-01-01 DIAGNOSIS — I10 Essential (primary) hypertension: Secondary | ICD-10-CM | POA: Diagnosis not present

## 2022-01-01 DIAGNOSIS — G4733 Obstructive sleep apnea (adult) (pediatric): Secondary | ICD-10-CM | POA: Insufficient documentation

## 2022-01-01 NOTE — Progress Notes (Signed)
Neuropsychological Evaluation   Patient:  Jacqueline Kim   DOB: 1945-11-09  MR Number: [id number]  Location: [facility] Jacqueline Kim [address] [place], [address] [id number] [facility] [place] [address] Dept: [phone]  Start: 10 AM End: 11 AM  Provider/Observer:     Hershal Coria PsyD  Chief Complaint:      Chief Complaint  Patient presents with   Memory Loss    Reason For Service:     During the most recent clinical interview on 10/21/2021 with the patient and her daughter, the patient reported to have completely ceased any consumption of alcohol and reports that this has helped significantly.  This has been true for the past 3 months and both the patient and her daughter reports that there has been an overall improvement in her day-to-day functioning.  While memory continues to be problematic and she is not remembering specific day-to-day events as she should, her overall judgment and cognitive function appears to have improved in some areas.  The patient has stopped seeing her previous neurologist and has now been referred to a neurologist at [facility] neurology Dr. Karel Jarvis.  The patient's medical records should be available to Dr. Vicente Serene neurology as part of the [facility] health system.  The patient has also stopped driving although she had had no accidents or incidents she had become concerned that her memory issues could cause some difficulties driving and she did not want to risk any accidents or difficulties.  She has plenty of family support whenever she needs assistance getting somewhere.  The patient reports that many of the day-to-day activities are being overseen by her family as far as finances but she is taking care of herself and is making good decisions at this point.  She clearly appears better with this visit than during the initial evaluation episode.  The patient continues to take Lexapro  as part of her medication management and is diligent with her medications.  She has a device that dispenses her medicines for her to make sure that they are taking properly.  There are other adaptations that have been made in her home to reduce any consequence of short-term memory issues such as having an electronic dog feeder so she does not overfeed her dog.  The patient was oriented today and mental status cognition appeared to be adequate although she has developed strategies to deal with lapses of memory etc.  09/10/2020 Clinical Information:  Jacqueline Kim is a 76 year old female referred for neuropsychological evaluation by her treating neurologist [name], MD.  She was initially referred for broader neurological evaluation by the patient's PCP [name], MD due to subjective reports of memory loss.  The patient has a past medical history including arthritis, degenerative spine disease, hypertension, A. fib, hypothyroidism, history of congestive heart failure, reflux disease, depression/anxiety, glaucoma, irritable bowel syndrome, status post pacemaker placement, hyponatremia, gait disorder with recurrent falls.  The patient is described progressive short-term memory deficits and the patient reports some word finding difficulties.  The patient reports that if she cannot remember something she tends to remember it later after period of time has passed.  The patient's daughter reports that these difficulties have been going on for the past 8 to 12 months and she sees them as progressing.   While long-term memory is reported to be quite good there are increasing self-reports of short-term memory issues over the past year and the patient reports  that she is getting more forgetful.  The patient quit smoking around 15 years ago and has had a significant reduction in alcohol consumption and currently the patient is reported to have approximately 1 large bottle of wine over a 3-day period of time.   The patient has a long history of balance and gait issues and has had a history of scoliosis and other spinal issues contributing to her gait difficulties.  She does need assistance for getting a shower due to balance therapies.  She has participated in physical therapy.   The patient and her daughter both deny any symptoms related to tremors, visual hallucinations or difficulties with visual-spatial/visual constructional or geographic disorientation type symptoms.   The patient has had recent metabolic issues that have been disturbed and issues with UTIs that have exacerbated her symptoms.  The patient's daughter [name] reports that more recently when the patient has dealt with a UTI that it is taking the patient longer to get over the impact it has on physical functioning and cognitive functioning with each infectious event.  The patient had a 3-day stay in a nursing [facility] program after a fall in August 2019.  Ongoing medical issues include problems with her back, osteoporosis, RA and A. fib.   There is a family history of some memory difficulties with her mother.  The mother's memory and cognitive issues developed after age 4 and the patient's mother lived to 76 years old.  Maternal grandmother is also reported to have had memory issues later in life.  The patient does not have any family information about her biological father.   The patient has had some issues with sleep and at times particular more recently has been up late at night and then sleeps late into the morning close to noon.  The patient reports that she will watch TV late at night.  Appetite is described as okay and the patient does not cook and most food is ordered.  She is not reported to have lost any weight recently but she does not tend to eat formal meals and eats a lot of snacks including fresh fruits.  Water intake is a concern.  The patient does have regular care from her daughters who live in the area and see her every  day.  The patient also has a son but he lives in [place].   The patient has had prior CT scans of her head as well as spine.  The patient is not able to have an MRI due to having a pacemaker.  CT head has shown no evidence of acute intracranial abnormality and mild cerebral atrophy with chronic small vessel ischemic disease.    Tests Administered: Wechsler Adult Intelligence Scale, 4th Edition (WAIS-IV) Controlled Oral Word Association Test (COWAT; FAS & Animals)  Wechsler Memory Scale, 4th Edition (WMS-IV); Adult Battery or Older Adult Battery   Participation Level:   Active  Participation Quality:  Redirectable      Behavioral Observation:  On the first date of testing the patient appeared well-groomed and appropriately dressed. Her manners were polite and appropriate to the situation. She initially demonstrated a negative attitude toward testing but still appeared to show good effort and by the end of the assessment her attitude was much more positive.    The second day these behavioral observations were made by psychometrician.  The patient appeared well-groomed and appropriately dressed. Her manners were polite and appropriate to the situation. The patient's attitude toward testing was negative but she complied  with all testing instructions and demonstrated fair effort. The patient required minimal repetition and prompting to complete sub-tests. The patient was easily able to understand instructions and did not require further explanation. The patient's concentration and understanding of testing instructions was significantly improved from initial testing in 2022.   Well Groomed, Alert, and Blunt.   Test Results:   The patient has now been readministered the neuropsychological test battery that was initially administered on 01/17/2021 for direct comparison purposes.  I will note above each test section the results from 2022 and 2023 to allow for direct comparisons.  Below these test results  I will provide a brief summary of these comparisons.  While the patient was somewhat negative towards the testing, She did show good effort and persistence.  Patient was able to understand instructions and she showed improved understanding on the current assessment vs initial assessment.  The results appear valid.    01/17/2021 Test Results:  The patient was administered the controlled oral Word Association test including the FAS and animal subtest.  Performance on these measures suggest that the patient is showing efficient and adequate word finding and expressive abilities.  There was no reduction in verbal fluency and no indications of expressive language deficits for verbal fluency measures or observations during clinical interview of paraphasic errors or circumlocutions etc.  10/29/2021 Test Results:  COWAT FAS Total = 36 Z =.032 Animals Total = 11 Z = -1.26 The patient shows little change with regard to her lexical fluency with her lexical fluency still performed in the average range with no indication of deficits.  She did show some mild loss with regard to semantic fluency comparing the 2022 and 2023 results.  Overall, the patient does appear to retain adequate expressive language functions.  01/17/2021 Test Results:  Composite Score Summary          Scale Sum of Scaled Scores Composite Score Percentile Rank 95% Conf. Interval Qualitative Description  Verbal Comprehension 33 VCI 105 63 99-110 Average  Perceptual Reasoning 15 PRI 71 3 66-79 Borderline  Working Memory 10 WMI 71 3 66-80 Borderline  Processing Speed 9 PSI 71 3 66-82 Borderline  Full Scale 67 FSIQ 77 6 73-82 Borderline  General Ability 48 GAI 87 19 82-92 Low Average    10/29/2021 Test Results:  Composite Score Summary         Scale Sum of Scaled Scores Composite Score Percentile Rank 95% Conf. Interval Qualitative Description  Verbal Comprehension 31 VCI 102 55 96-108 Average  Perceptual Reasoning 15 PRI 71 3  66-79 Borderline  Working Memory 14 WMI 83 13 77-91 Low Average  Processing Speed 6 PSI 62 1 57-74 Extremely Low  Full Scale 66 FSIQ 77 6 73-82 Borderline  General Ability 46 GAI 84 14 80-89 Low Average    The patient shows very similar performance on the current assessment versus initial assessment for global functioning scales including full-scale IQ scores and general abilities scores with performance is essentially identical to 1 another.  The patient demonstrated stability with regard to her verbal comprehension skills and in fact likely improved versus initial assessment on measures of verbal reasoning and problem-solving and ability to retrieve vocabulary information.  The patient also did well on social judgment comprehension indices.  The patient did have relative weakness with regard to her ability to pull up longstanding well learned information (general fund of information).  The patient showed continued and in fact worsening performance on measures of information processing speed requiring visual  scanning, visual searching and overall speed of mental operations.  The patient also showed continued but identical performances with regard to visual spatial and visual constructional capacities.    01/17/2021 Test Results:   Verbal Comprehension Subtests Summary        Subtest Raw Score Scaled Score Percentile Rank Reference Group Scaled Score SEM  Similarities 24 11 63 10 1.12  Vocabulary 41 11 63 12 0.73    10/29/2021 Test Results:          Verbal Comprehension Subtests Summary      Subtest Raw Score Scaled Score Percentile Rank Reference Group Scaled Score SEM  Similarities 28 13 84 11 1.12  Vocabulary 43 12 75 12 0.73  Information 7 6 9 6  0.73  (Comprehension) 27 13 84 12 1.08    The patient actually showed improvements between initial testing to current testing on measures of visual reasoning and problem-solving capacity.  While not initially administered the patient showed some  deficits with regard to retrieving previously well learned information but showed very good social judgment comprehension capacities.   01/17/2021 Test Results:   Perceptual Reasoning Subtests Summary        Subtest Raw Score Scaled Score Percentile Rank Reference Group Scaled Score SEM  Block Design 4 2 0.4 1 1.27  Matrix Reasoning 5 6 9 2  0.73  Visual Puzzles 6 7 16 4  0.99    10/29/2021 Test Results:           Perceptual Reasoning Subtests Summary    Subtest Raw Score Scaled Score Percentile Rank Reference Group Scaled Score SEM  Block Design 6 3 1 2  1.27  Matrix Reasoning 8 8 25 4  0.73  Visual Puzzles 3 4 2 2  0.99  (Picture Completion) 5 7 16 4  1.12      The patient displayed very consistent performances between the 22 and 23 assessment dates with continued significant deficits that were profound in nature on measures of visual analysis and organizational capacity with some improvements on measures of visual reasoning and problem-solving but worsening performance on measures of visual estimation and judgment capacity.  There do continue to be significant deficits with regard to visual spatial and visual reasoning abilities but no clear progressive loss other than her ability to perform visual estimation types of tasks.    01/17/2021 Test Results:   Working Memory Subtests Summary        Subtest Raw Score Scaled Score Percentile Rank Reference Group Scaled Score SEM  Digit Span 10 2 0.4 1 0.79  Arithmetic 10 8 25 7  0.95    10/29/2021 Test Results:           Working Memory Subtests Summary     Subtest Raw Score Scaled Score Percentile Rank Reference Group Scaled Score SEM  Digit Span 17 6 9 4  0.79  Arithmetic 10 8 25 7  0.95    The patient showed some improvement versus the initial assessment on measures assessing primary auditory encoding capacity and a stability of her ability to actively process information in her active auditory register.  This is consistent with behavioral  observations indicating improved attention on the most recent assessment versus the initial assessment.  She does continue to have mild to moderate deficits with regard to auditory encoding capacity.   01/17/2021 Test Results:   Processing Speed Subtests Summary        Subtest Raw Score Scaled Score Percentile Rank Reference Group Scaled Score SEM  Symbol Search 10 5 5 2  1.12  Coding 1.12    10/29/2021 Test Results:           Processing Speed Subtests Summary      Subtest Raw Score Scaled Score Percentile Rank Reference Group Scaled Score SEM  Symbol Search 4 2 0.4 1 1.12  Coding 1.12   The patient shows continued significant deficits with regard to focus execute abilities and visual scanning, visual searching and overall speed of mental operations measures.  There is some further decline on these components and this is a significant decline particularly given the fact that she was already showing some significant impairments previously.  01/17/2021 Test Results:  Index Score Summary              Index Sum of Scaled Scores Index Score Percentile Rank 95% Confidence Interval Qualitative Descriptor  Auditory Memory (AMI) 20 70 2 65-78 Borderline  Visual Memory (VMI) 3 45 <0.1 42-52 Extremely Low  Immediate Memory (IMI) 12 63 1 59-71 Extremely Low  Delayed Memory (DMI) 11 61 0.5 56-72 Extremely Low    10/29/2021 Test Results:  Index Score Summary      Index Sum of Scaled Scores Index Score Percentile Rank 95% Confidence Interval Qualitative Descriptor  Auditory Memory (AMI) 26 80 9 75-87 Low Average  Visual Memory (VMI) 3 45 <0.1 42-52 Extremely Low  Immediate Memory (IMI) 15 69 2 64-77 Extremely Low  Delayed Memory (DMI) 14 67 1 62-77 Extremely Low      While the patient displays some improvements with regard to auditory encoding capacity between the 2 assessment dates her auditory encoding capacity continues to be impaired with primary difficulties related to  auditory encoding capacity.  Similar deficits were noted with regard to visual encoding capacity and these levels of encoding deficits are very likely to have a significant impact on her capacity to store and organize new information.  The patient does show some improvement with regard to auditory memory on the most recent assessment but it continues to be more than 20 points below predicted levels of premorbid functioning even with this improvement.  This is likely related to her improved auditory encoding capacity but she does continue to show deficits.  The patient continues to show severe and profound visual memory deficits which are much greater than her auditory memory deficits and there was no improvement from the initial assessment of the current assessment.  The mild improvements an immediate memory and delayed memory on the current assessment are almost completely due to her improved auditory memory functions with no improvement with regard to visual memory functions.  The patient does continue to show improvements under recognition/cued recall formats particularly with regard to her visual memory deficits suggesting that encoding and retrieval are the primary deficit but the patient clearly shows deficits with regard to storage and organization of new information as well.   10/29/2021 Test Results:          Primary Subtest Scaled Score Summary      Subtest Domain Raw Score Scaled Score Percentile Rank  Logical Memory I AM Logical Memory II AM Verbal Paired Associates I AM Verbal Paired Associates II AM Visual Reproduction I VM 5 1 0.1  Visual Reproduction II VM 0 2 0.4  Symbol Span VWM 10/29/2021 Test Results:  Auditory Memory Process Score Summary      Process Score Raw Score Scaled Score Percentile Rank Cumulative Percentage (Base Rate)  LM II Recognition 21 - - >75%  VPA II Recognition 25 - - 17-25%       10/29/2021 Test  Results:            Visual Memory Process Score Summary      Process Score Raw Score Scaled Score Percentile Rank Cumulative Percentage (Base Rate)  VR II Recognition 3 - - 26-50%      Summary of Results:   Overall, the results of the current neuropsychological evaluation do show primarily maintenance/stability of global cognitive functioning and even showing some improvements from initial testing on various cognitive domains.  The patient is showing general retention of expressive language functioning with maintenance of good lexical fluency and only mild decreases in semantic fluency.  There is some evidence of improvements with regard to auditory encoding capacity and her ability with regard to well-preserved verbal reasoning and problem-solving and vocabulary knowledge.  However, she continues to show significant and profound deficits with regard to visual spatial and visual constructional abilities and a further decline in her focus execute abilities in general speed of mental operations.  The improvements in auditory encoding capacity were most evident with her improvements in auditory memory and learning although her auditory memory and learning is still significantly below predicted levels of premorbid functioning.  She continues to show severe and profound deficits with regard to visual memory and learning.  In-depth analysis of memory and learning components suggest that the greatest deficits have to do with visual encoding deficits and to a lesser degree auditory encoding deficits and severe deficits with regard to retrieval of information.  She showed some improvement under recognition and cued recall formats for both auditory and visual information.  Impression/Diagnosis:   Overall, the results of the current neuropsychological evaluation are generally encouraging.  The patient is now completely stopped all alcohol consumption and is monitoring and with weight loss may be having less issues  her obstructive sleep apnea.  There is also been an improvement in her overall medical wellness with better management of her metabolic status with most recent lab works all within normal limits for the most part.  There does not appear to be any significant progression in general although she does show worsening with regard to information processing speed, free recall of recently experienced information with continued profound deficits for visual memory and moderate deficits for auditory memory.  The patient continues to show multiple domains of cognitive deficits including memory, information processing speed, visual-spatial and visual constructional capacities with generally well-preserved expressive language.  The patient and her daughter continue to deny any significant geographic disorientation, changes in motor functioning, or any visual or auditory hallucinations.  There is also no significant behavioral disturbance.  As far as diagnostic considerations the patient continues to meet the criterion for a major neurocognitive disorder/dementia with multiple domains of significant cognitive changes and impairments.  There does not appear to be any progressive change over the past year for the most part although this stability and interpretation of the stability over time is impacted by the significant improvement in her overall health and the patient completely stopping alcohol which was likely playing a role to some degree in her overall deficits.  She has not been having significant medical illness and UTIs have been less of an issue recently.  While this stability would argue against the patient's cognitive  deficits being primarily related to an Alzheimer's type condition this still cannot be ruled out but the likelihood of her cognitive deficits to be more of a cerebrovascular issue particularly microvascular ischemic disease/small vessel disease is more likely an prominent with this repeat testing  results.  The patient has not been able to have an MRI leaving [place] with CT exams as our only neuro imaging pathway.  I do, however, temper my initial assessment attributing the observed neurocognitive deficits as being most likely attributed to an Alzheimer's type cortical progressive pathology and at this point these cognitive deficits may be more related to cerebrovascular microvascular ischemic changes.  As far as treatment recommendations it is important for the patient to continue with good health practices around diet, exercise, and good sleep hygiene.  The patient should remain very diligent as far as managing her diagnosis of obstructive sleep apnea.  The patient did not initiate CPAP use and after weight loss was reported to have less observed symptoms consistent with obstructive sleep apnea.  However, leaving a condition like sleep apnea untreated could have a significant impact if microvascular ischemic changes/small vessel disease are the primary culprit.  I will encourage the patient to consider having a new sleep study to make sure we are not missing something that is highly treatable.  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 Clinical Neuropsychologist

## 2022-01-03 ENCOUNTER — Other Ambulatory Visit (HOSPITAL_COMMUNITY): Payer: Self-pay

## 2022-01-03 MED ORDER — TRAZODONE HCL 150 MG PO TABS
75.0000 mg | ORAL_TABLET | Freq: Every evening | ORAL | 6 refills | Status: DC
  Filled 2022-01-03: qty 30, 60d supply, fill #0
  Filled 2022-03-03: qty 30, 60d supply, fill #1
  Filled 2022-05-02: qty 30, 60d supply, fill #2
  Filled 2022-07-01: qty 30, 60d supply, fill #3

## 2022-01-05 ENCOUNTER — Encounter: Payer: Self-pay | Admitting: Cardiovascular Disease

## 2022-01-07 ENCOUNTER — Ambulatory Visit (INDEPENDENT_AMBULATORY_CARE_PROVIDER_SITE_OTHER): Payer: Medicare Other

## 2022-01-07 DIAGNOSIS — I495 Sick sinus syndrome: Secondary | ICD-10-CM | POA: Diagnosis not present

## 2022-01-07 LAB — CUP PACEART REMOTE DEVICE CHECK
Battery Remaining Longevity: 159 mo
Battery Voltage: 3.19 V
Brady Statistic AP VP Percent: 0.1 %
Brady Statistic AP VS Percent: 98.46 %
Brady Statistic AS VP Percent: 0.02 %
Brady Statistic AS VS Percent: 1.44 %
Brady Statistic RA Percent Paced: 91.15 %
Brady Statistic RV Percent Paced: 1.6 %
Date Time Interrogation Session: 20230725022937
Implantable Lead Implant Date: 20121023
Implantable Lead Implant Date: 20121023
Implantable Lead Location: 753859
Implantable Lead Location: 753860
Implantable Pulse Generator Implant Date: 20230424
Lead Channel Impedance Value: 1349 Ohm
Lead Channel Impedance Value: 342 Ohm
Lead Channel Impedance Value: 380 Ohm
Lead Channel Impedance Value: 988 Ohm
Lead Channel Pacing Threshold Amplitude: 0.5 V
Lead Channel Pacing Threshold Amplitude: 2.375 V
Lead Channel Pacing Threshold Pulse Width: 0.4 ms
Lead Channel Pacing Threshold Pulse Width: 0.4 ms
Lead Channel Sensing Intrinsic Amplitude: 2.125 mV
Lead Channel Sensing Intrinsic Amplitude: 2.125 mV
Lead Channel Sensing Intrinsic Amplitude: 8.125 mV
Lead Channel Sensing Intrinsic Amplitude: 8.125 mV
Lead Channel Setting Pacing Amplitude: 1.5 V
Lead Channel Setting Pacing Amplitude: 3.5 V
Lead Channel Setting Pacing Pulse Width: 0.7 ms
Lead Channel Setting Sensing Sensitivity: 0.9 mV

## 2022-01-09 ENCOUNTER — Encounter (INDEPENDENT_AMBULATORY_CARE_PROVIDER_SITE_OTHER): Payer: Self-pay | Admitting: Ophthalmology

## 2022-01-09 ENCOUNTER — Ambulatory Visit (INDEPENDENT_AMBULATORY_CARE_PROVIDER_SITE_OTHER): Payer: Medicare Other | Admitting: Ophthalmology

## 2022-01-09 DIAGNOSIS — H353124 Nonexudative age-related macular degeneration, left eye, advanced atrophic with subfoveal involvement: Secondary | ICD-10-CM

## 2022-01-09 DIAGNOSIS — H353133 Nonexudative age-related macular degeneration, bilateral, advanced atrophic without subfoveal involvement: Secondary | ICD-10-CM

## 2022-01-09 DIAGNOSIS — H353222 Exudative age-related macular degeneration, left eye, with inactive choroidal neovascularization: Secondary | ICD-10-CM | POA: Diagnosis not present

## 2022-01-09 DIAGNOSIS — H353113 Nonexudative age-related macular degeneration, right eye, advanced atrophic without subfoveal involvement: Secondary | ICD-10-CM | POA: Diagnosis not present

## 2022-01-09 NOTE — Assessment & Plan Note (Signed)
Will likely be candidate for Syfovre in the future

## 2022-01-09 NOTE — Progress Notes (Signed)
01/09/2022     CHIEF COMPLAINT Patient presents for  Chief Complaint  Patient presents with   Macular Degeneration      HISTORY OF PRESENT ILLNESS: Jacqueline Kim is a 76 y.o. female who presents to the clinic today for:   HPI   1 year fu OCT FP  Pt states her vision has not been stable Pt states no new floaters but she notice them a lot more  Pt states her vision has been very blurry Last edited by Aleene Davidson, CMA on 01/09/2022  1:39 PM.      Referring physician: Georgianne Fick, MD [address] [place],  [place] [address]  HISTORICAL INFORMATION:   Selected notes from the MEDICAL RECORD NUMBER    Lab Results  Component Value Date   HGBA1C 5.3 04/01/2011     CURRENT MEDICATIONS: No current outpatient medications on file. (Ophthalmic Drugs)   No current facility-administered medications for this visit. (Ophthalmic Drugs)   Current Outpatient Medications (Other)  Medication Sig   amiodarone (PACERONE) 200 MG tablet Take 1 tablet (200 mg total) by mouth daily.   clobetasol (TEMOVATE) 0.05 % external solution Apply 1 application topically 2  times daily to affected area(s) as needed, Do not apply to face,groin or armpit   Digoxin 62.5 MCG TABS Take 1 tablet by mouth every other day.   diltiazem (CARTIA XT) 120 MG 24 hr capsule Take 1 capsule (120 mg total) by mouth at bedtime. Keep upcoming appointment for future refills.   escitalopram (LEXAPRO) 20 MG tablet Take 1 tablet (20 mg total) by mouth daily.   HYDROcodone-acetaminophen (NORCO/VICODIN) 5-325 MG tablet Take 1 tablet by mouth every 6 (six) hours as needed.   HYDROcodone-acetaminophen (NORCO/VICODIN) 5-325 MG tablet Take 1 tablet by mouth every 6 (six) hours as needed.   hydroxychloroquine (PLAQUENIL) 200 MG tablet Take 1.5 tablets (300 mg total) by mouth daily with food or milk (Patient taking differently: Take 200 mg by mouth daily.)   levothyroxine (SYNTHROID) 112 MCG tablet Take 1  tablet (112 mcg total) by mouth daily.   nitrofurantoin (MACRODANTIN) 50 MG capsule Take 1 capsule (50 mg total) by mouth daily.   traZODone (DESYREL) 150 MG tablet Take 0.5 tablets (75 mg total) by mouth once a day in the evening.   triamcinolone cream (KENALOG) 0.1 % Apply 1 application to affected area(s) up to 2 (two) times daily as needed. Do not apply to face, groin, or armpit.   No current facility-administered medications for this visit. (Other)      REVIEW OF SYSTEMS: ROS   Negative for: Constitutional, Gastrointestinal, Neurological, Skin, Genitourinary, Musculoskeletal, HENT, Endocrine, Cardiovascular, Eyes, Respiratory, Psychiatric, Allergic/Imm, Heme/Lymph Last edited by Aleene Davidson, CMA on 01/09/2022  1:39 PM.       ALLERGIES Allergies  Allergen Reactions   Azithromycin Other (See Comments)    Causes heart rhythm issues    Epinephrine Other (See Comments)    Causes A-Fib    Penicillins Anaphylaxis    Has patient had a PCN reaction causing immediate rash, facial/tongue/throat swelling, SOB or lightheadedness with hypotension: Yes Has patient had a PCN reaction causing severe rash involving mucus membranes or skin necrosis: No Has patient had a PCN reaction that required hospitalization No Has patient had a PCN reaction occurring within the last 10 years: No If all of the above answers are "NO", then may proceed with Cephalosporin use.    Lidocaine Palpitations   Metoprolol Other (See Comments)  alopecia     PAST MEDICAL HISTORY Past Medical History:  Diagnosis Date   Anticoagulant long-term use  WITH PRADAXA 05/27/2011   Anxiety    Cellulitis of buttock, left 12/20/2014   Complication of anesthesia    DIFFICULTY WAKING UP   Depression    Diverticulosis    DJD (degenerative joint disease)    Esophagitis 09/17/06   External hemorrhoids    GERD (gastroesophageal reflux disease)    Glaucoma    History of stress test    a. 2008 - normal nuc.    Hypertension    Hypothyroid    IBS (irritable bowel syndrome)    Paroxysmal A-fib (HCC) diagnosed 2008   a. On propafenone, Pradaxa.   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   s/p MDT PPM by Dr Rubie Maid   Past Surgical History:  Procedure Laterality Date   BREAST REDUCTION SURGERY     BUNIONECTOMY Right 07/09/2015   with rods and pins   CARDIOVERSION N/A 08/02/2014   Procedure: CARDIOVERSION;  Surgeon: Quintella Reichert, MD;  Location: [facility];  Service: Cardiovascular;  Laterality: N/A;   CATARACT EXTRACTION, BILATERAL Bilateral    FACIAL COSMETIC SURGERY     FOOT SURGERY Left    HAND SURGERY     multiple   LIPOSUCTION     NM MYOCAR PERF WALL MOTION  12/29/2006   No significant ischemia; EF 69%   OVARIAN CYST REMOVAL     PACEMAKER INSERTION  04/08/11   MDT Revo implanted by Dr Rubie Maid   PPM GENERATOR CHANGEOUT N/A 10/07/2021   Procedure: PPM GENERATOR CHANGEOUT;  Surgeon: Thurmon Fair, MD;  Location: [facility];  Service: Cardiovascular;  Laterality: N/A;   TONSILLECTOMY     TOTAL KNEE ARTHROPLASTY Left 05/01/2015   Procedure: TOTAL LEFT KNEE ARTHROPLASTY;  Surgeon: Durene Romans, MD;  Location: [facility];  Service: Orthopedics;  Laterality: Left;   VAGINAL HYSTERECTOMY      FAMILY HISTORY Family History  Problem Relation Age of Onset   Anesthesia problems Mother    Colon polyps Mother        beign   Colon cancer Neg Hx    Dementia Neg Hx     SOCIAL HISTORY Social History   Tobacco Use   Smoking status: Former    Types: Cigarettes    Quit date: 08/30/1996    Years since quitting: 25.3   Smokeless tobacco: Never  Substance Use Topics   Alcohol use: Yes    Alcohol/week: 14.0 standard drinks of alcohol    Types: 14 Glasses of wine per week    Comment: daily   Drug use: No         OPHTHALMIC EXAM:  Base Eye Exam     Visual Acuity (ETDRS)       Right Left   Dist [place] 20/30  20/80         Tonometry (Tonopen, 1:43 PM)       Right Left   Pressure 12 13         Pupils       Pupils Shape APD   Right PERRL Round None   Left PERRL           Visual Fields       Left Right    Full Full         Extraocular Movement       Right Left    Full,  Ortho Full, Ortho         Neuro/Psych     Oriented x3: Yes         Dilation     Both eyes: 1.0% Mydriacyl, 2.5% Phenylephrine @ 1:40 PM           Slit Lamp and Fundus Exam     External Exam       Right Left   External Normal Normal         Slit Lamp Exam       Right Left   Lids/Lashes Normal Normal   Conjunctiva/Sclera White and quiet White and quiet   Cornea Clear Clear   Anterior Chamber Deep and quiet Deep and quiet   Iris Round and reactive Round and reactive   Lens Centered posterior chamber intraocular lens Centered posterior chamber intraocular lens   Anterior Vitreous Normal Normal         Fundus Exam       Right Left   Posterior Vitreous Posterior vitreous detachment Posterior vitreous detachment   Disc Normal Normal   C/D Ratio 0.6 0.6   Macula Soft drusen, Retinal pigment epithelial mottling Soft drusen, Retinal pigment epithelial mottling, Geographic atrophy   Vessels Normal Normal   Periphery Normal Normal            IMAGING AND PROCEDURES  Imaging and Procedures for 01/09/22  OCT, Retina - OU - Both Eyes       Right Eye Quality was good. Scan locations included subfoveal. Central Foveal Thickness: 226. Progression has been stable. Findings include retinal drusen , central retinal atrophy, inner retinal atrophy, outer retinal atrophy.   Left Eye Quality was good. Scan locations included subfoveal. Central Foveal Thickness: 201. Progression has been stable. Findings include retinal drusen , central retinal atrophy, inner retinal atrophy, outer retinal atrophy.   Notes No sign of active CNVM in either eye.  Subfoveal atrophy OS from  geographic atrophy seen clinically accounts for acuity     Color Fundus Photography Optos - OU - Both Eyes       Right Eye Progression has been stable. Disc findings include normal observations. Macula : drusen, geographic atrophy. Vessels : normal observations.   Left Eye Progression has been stable. Disc findings include normal observations. Macula : drusen, geographic atrophy. Vessels : normal observations. Periphery : normal observations.              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 The results of the AREDS 2 study was discussed with the patient. A diet rich in dark leafy green vegetables was advised and specific recommendations were made regarding supplements with AREDS 2 formulation . Control of hypertension and serum cholesterol may slow the disease. Smoking cessation is mandatory to slow the disease and diminish the risk of progressing to wet age related macular degeneration. The patient was instructed in the use of an Amsler Grid and was told to return immediately for any changes in the Grid. Stressed to the patient do not rub eyes  Two forms of dry ARMD exist, one with drusen deposits, yellowish deposits with no obvious atrophy,  ATROPHIC which is the loss of normal blood supply to the choroid (the orange or copper green color layer of the outer retina), which is seen as large areas of atrophy which grow over time to  decrease vision.  This atrophic form of dry ARMD is treatable as of Fall 2023.  The treatment consists of injection into the affected eye monthly at first, in attempt to slow the growth of the atrophy. The first approved medication is SYFOVRE (pegcetacoplan).     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 Advanced nonexudative age-related macular degeneration of left eye with subfoveal involvement  H35.3124     3. 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 Exudative age-related macular degeneration of left eye with inactive choroidal neovascularization (HCC)  H35.3222       1.  Observe OU today, no sign of CNVM  2.  Excellent candidate for Syfovre in the future particularly OD  3.  Ophthalmic Meds Ordered this visit:  No orders of the defined types were placed in this encounter.      Return in about 4 months (around 05/12/2022) for DILATE OU, OCT may consider Syfovre OD .  There are no Patient Instructions on file for this visit.   Explained the diagnoses, plan, and follow up with the patient and they expressed understanding.  Patient expressed understanding of the importance of proper follow up care.   Alford Highland Kinjal Neitzke M.D. Diseases & Surgery of the Retina and Vitreous [facility] 01/09/22     Abbreviations: M myopia (nearsighted); A astigmatism; H hyperopia (farsighted); P presbyopia; Mrx spectacle prescription;  CTL contact lenses; OD right eye; OS left eye; OU both eyes  XT exotropia; ET esotropia; PEK punctate epithelial keratitis; PEE punctate epithelial erosions; DES dry eye syndrome; MGD meibomian gland dysfunction; ATs artificial tears; PFAT's preservative free artificial tears; NSC nuclear sclerotic cataract; PSC posterior subcapsular cataract; ERM epi-retinal membrane;  PVD posterior vitreous detachment; RD retinal detachment; DM diabetes mellitus; DR diabetic retinopathy; NPDR non-proliferative diabetic retinopathy; PDR proliferative diabetic retinopathy; CSME clinically significant macular edema; DME diabetic macular edema; dbh dot blot hemorrhages; CWS cotton wool spot; POAG primary open angle glaucoma; C/D cup-to-disc ratio; HVF humphrey visual field; GVF goldmann visual field; OCT optical coherence tomography; IOP intraocular pressure; BRVO Branch retinal vein occlusion; CRVO central retinal vein occlusion; CRAO central retinal artery occlusion; BRAO branch retinal artery occlusion; RT retinal tear; SB scleral buckle; PPV pars plana vitrectomy; VH Vitreous hemorrhage; PRP panretinal laser photocoagulation; IVK intravitreal kenalog; VMT vitreomacular traction; MH Macular hole;  NVD neovascularization of the disc; NVE neovascularization elsewhere; AREDS age related eye disease study; ARMD age related macular degeneration; POAG primary open angle glaucoma; EBMD epithelial/anterior basement membrane dystrophy; ACIOL anterior chamber intraocular lens; IOL intraocular lens; PCIOL posterior chamber intraocular lens; Phaco/IOL phacoemulsification with intraocular lens placement; PRK photorefractive keratectomy; LASIK laser assisted in situ keratomileusis; HTN hypertension; DM diabetes mellitus; COPD chronic obstructive pulmonary disease

## 2022-01-09 NOTE — Assessment & Plan Note (Signed)
No sign of recurrence of CNVM

## 2022-01-09 NOTE — Assessment & Plan Note (Signed)
The nature of dry age related macular degeneration was discussed with the patient as well as its possible conversion to wet. The results of the AREDS 2 study was discussed with the patient. A diet rich in dark leafy green vegetables was advised and specific recommendations were made regarding supplements with AREDS 2 formulation . Control of hypertension and serum cholesterol may slow the disease. Smoking cessation is mandatory to slow the disease and diminish the risk of progressing to wet age related macular degeneration. The patient was instructed in the use of an Amsler Grid and was told to return immediately for any changes in the Grid. Stressed to the patient do not rub eyes  Two forms of dry ARMD exist, one with drusen deposits, yellowish deposits with no obvious atrophy,  ATROPHIC which is the loss of normal blood supply to the choroid (the orange or copper green color layer of the outer retina), which is seen as large areas of atrophy which grow over time to decrease vision.  This atrophic form of dry ARMD is treatable as of Fall 2023.  The treatment consists of injection into the affected eye monthly at first, in attempt to slow the growth of the atrophy. The first approved medication is SYFOVRE (pegcetacoplan).    

## 2022-01-13 ENCOUNTER — Other Ambulatory Visit (HOSPITAL_COMMUNITY): Payer: Self-pay

## 2022-01-13 ENCOUNTER — Encounter: Payer: Self-pay | Admitting: Cardiovascular Disease

## 2022-01-13 ENCOUNTER — Ambulatory Visit (INDEPENDENT_AMBULATORY_CARE_PROVIDER_SITE_OTHER): Payer: Medicare Other | Admitting: Cardiovascular Disease

## 2022-01-13 VITALS — BP 122/62 | HR 86 | Ht 63.0 in | Wt 119.4 lb

## 2022-01-13 DIAGNOSIS — Z5181 Encounter for therapeutic drug level monitoring: Secondary | ICD-10-CM

## 2022-01-13 DIAGNOSIS — I495 Sick sinus syndrome: Secondary | ICD-10-CM

## 2022-01-13 DIAGNOSIS — I480 Paroxysmal atrial fibrillation: Secondary | ICD-10-CM | POA: Diagnosis not present

## 2022-01-13 DIAGNOSIS — Z950 Presence of cardiac pacemaker: Secondary | ICD-10-CM | POA: Diagnosis not present

## 2022-01-13 DIAGNOSIS — I10 Essential (primary) hypertension: Secondary | ICD-10-CM

## 2022-01-13 DIAGNOSIS — Z79899 Other long term (current) drug therapy: Secondary | ICD-10-CM

## 2022-01-13 MED ORDER — HYDROCODONE-ACETAMINOPHEN 5-325 MG PO TABS
1.0000 | ORAL_TABLET | Freq: Four times a day (QID) | ORAL | 0 refills | Status: DC | PRN
  Filled 2022-01-20: qty 120, 30d supply, fill #0

## 2022-01-13 NOTE — Patient Instructions (Signed)
Medication Instructions:  No changes *If you need a refill on your cardiac medications before your next appointment, please call your pharmacy*   Lab Work: None ordered If you have labs (blood work) drawn today and your tests are completely normal, you will receive your results only by: MyChart Message (if you have MyChart) OR A paper copy in the mail If you have any lab test that is abnormal or we need to change your treatment, we will call you to review the results.   Testing/Procedures: None ordered   Follow-Up: At [facility], you and your health needs are our priority.  As part of our continuing mission to provide you with exceptional heart care, we have created designated Provider Care Teams.  These Care Teams include your primary Cardiologist (physician) and Advanced Practice Providers (APPs -  Physician Assistants and Nurse Practitioners) who all work together to provide you with the care you need, when you need it.  We recommend signing up for the patient portal called "MyChart".  Sign up information is provided on this After Visit Summary.  MyChart is used to connect with patients for Virtual Visits (Telemedicine).  Patients are able to view lab/test results, encounter notes, upcoming appointments, etc.  Non-urgent messages can be sent to your provider as well.   To learn more about what you can do with MyChart, go to [email or web address].    Your next appointment:   12 month(s)  The format for your next appointment:   In Person  Provider:   Dr. Kelly    Important Information About Sugar       

## 2022-01-13 NOTE — Progress Notes (Signed)
Cardiology Office Note    Date:  01/13/2022   ID:  Jacqueline Kim, DOB 07-04-1945, MRN [id number]  PCP:  Georgianne Fick, MD  Cardiologist:  Nicki Guadalajara, MD; Thurmon Fair, MD   Chief Complaint  Patient presents with   Pacemaker Check    History of Present Illness:  Jacqueline Kim is a 76 y.o. female with paroxysmal atrial fibrillation and sinus node dysfunction, here for pacemaker check.  She is accompanied by her daughter.  She recently saw Dr. [name] in clinic.  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  She underwent pacemaker generator change out on October 07, 2021.  The site has healed very nicely.  The device sticks out rather prominently since she has lost so much weight, but the overlying skin appears healthy and there is no inflammatory change.  She feels well. The patient specifically denies any chest pain at rest exertion, dyspnea at rest or with exertion, orthopnea, paroxysmal nocturnal dyspnea, syncope, palpitations, focal neurological deficits, intermittent claudication, lower extremity edema, unexplained weight gain, cough, hemoptysis or wheezing.  She has not had any falls or bleeding problems.  Device interrogation shows normal function.  All lead parameters are good.  Generator longevity is estimated at 13 years.  Activity is also good at about 3.7 hours/day on the average.  The heart rate histogram distribution is appropriate.  She has 91% atrial pacing and only 1.6% ventricular pacing.  The burden of atrial fibrillation is 7.4% since generator change out, but appears to be decreasing over the last month or so.  Rate control is usually very good, there is one episode of high ventricular rate labeled nonsustained ventricular tachycardia which actually represents atrial fibrillation rapid ventricular response.  She is now on amiodarone and had normal liver function tests and TSH last April.     Past Medical History:   Diagnosis Date   Anticoagulant long-term use  WITH PRADAXA 05/27/2011   Anxiety    Cellulitis of buttock, left 12/20/2014   Complication of anesthesia    DIFFICULTY WAKING UP   Depression    Diverticulosis    DJD (degenerative joint disease)    Esophagitis 09/17/06   External hemorrhoids    GERD (gastroesophageal reflux disease)    Glaucoma    History of stress test    a. 2008 - normal nuc.   Hypertension    Hypothyroid    IBS (irritable bowel syndrome)    Paroxysmal A-fib (HCC) diagnosed 2008   a. On propafenone, Pradaxa.   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   s/p MDT PPM by Dr Rubie Maid    Past Surgical History:  Procedure Laterality Date   BREAST REDUCTION SURGERY     BUNIONECTOMY Right 07/09/2015   with rods and pins   CARDIOVERSION N/A 08/02/2014   Procedure: CARDIOVERSION;  Surgeon: Quintella Reichert, MD;  Location: [facility];  Service: Cardiovascular;  Laterality: N/A;   CATARACT EXTRACTION, BILATERAL Bilateral    FACIAL COSMETIC SURGERY     FOOT SURGERY Left    HAND SURGERY     multiple   LIPOSUCTION     NM MYOCAR PERF WALL MOTION  12/29/2006   No significant ischemia; EF 69%   OVARIAN CYST REMOVAL     PACEMAKER INSERTION  04/08/11   MDT Revo implanted by Dr Rubie Maid   PPM GENERATOR CHANGEOUT N/A 10/07/2021   Procedure: PPM GENERATOR CHANGEOUT;  Surgeon: Thurmon Fair,  MD;  Location: [facility];  Service: Cardiovascular;  Laterality: N/A;   TONSILLECTOMY     TOTAL KNEE ARTHROPLASTY Left 05/01/2015   Procedure: TOTAL LEFT KNEE ARTHROPLASTY;  Surgeon: Durene Romans, MD;  Location: [facility];  Service: Orthopedics;  Laterality: Left;   VAGINAL HYSTERECTOMY      Current Medications: Outpatient Medications Prior to Visit  Medication Sig Dispense Refill   amiodarone (PACERONE) 200 MG tablet Take 1 tablet (200 mg total) by mouth daily. 90 tablet 1   Digoxin 62.5 MCG TABS Take 1 tablet by  mouth every other day. 15 tablet 6   diltiazem (CARTIA XT) 120 MG 24 hr capsule Take 1 capsule (120 mg total) by mouth at bedtime. Keep upcoming appointment for future refills. 90 capsule 3   escitalopram (LEXAPRO) 20 MG tablet Take 1 tablet (20 mg total) by mouth daily. 90 tablet 3   HYDROcodone-acetaminophen (NORCO/VICODIN) 5-325 MG tablet Take 1 tablet by mouth every 6 (six) hours as needed. 120 tablet 0   hydroxychloroquine (PLAQUENIL) 200 MG tablet Take 1.5 tablets (300 mg total) by mouth daily with food or milk (Patient taking differently: Take 200 mg by mouth daily.) 45 tablet 2   levothyroxine (SYNTHROID) 112 MCG tablet Take 1 tablet (112 mcg total) by mouth daily. 30 tablet 0   nitrofurantoin (MACRODANTIN) 50 MG capsule Take 1 capsule (50 mg total) by mouth daily. 30 capsule 6   traZODone (DESYREL) 150 MG tablet Take 0.5 tablets (75 mg total) by mouth once a day in the evening. 30 tablet 6   triamcinolone cream (KENALOG) 0.1 % Apply 1 application to affected area(s) up to 2 (two) times daily as needed. Do not apply to face, groin, or armpit. 454 g 3   atenolol (TENORMIN) 50 MG tablet Take 50 mg by mouth daily as needed. (Patient not taking: Reported on 01/13/2022)     clobetasol (TEMOVATE) 0.05 % external solution Apply 1 application topically 2  times daily to affected area(s) as needed, Do not apply to face,groin or armpit (Patient not taking: Reported on 01/13/2022) 50 mL 3   HYDROcodone-acetaminophen (NORCO/VICODIN) 5-325 MG tablet Take 1 tablet by mouth every 6 (six) hours as needed. 120 tablet 0   No facility-administered medications prior to visit.     Allergies:   Azithromycin, Epinephrine, Penicillins, Lidocaine, and Metoprolol   Social History   Socioeconomic History   Marital status: Divorced    Spouse name: Not on file   Number of children: 3   Years of education: Not on file   Highest education level: Not on file  Occupational History   Occupation: retired    Associate Professor:  RETIRED  Tobacco Use   Smoking status: Former    Types: Cigarettes    Quit date: 08/30/1996    Years since quitting: 25.3   Smokeless tobacco: Never  Substance and Sexual Activity   Alcohol use: Yes    Alcohol/week: 14.0 standard drinks of alcohol    Types: 14 Glasses of wine per week    Comment: daily   Drug use: No   Sexual activity: Yes  Other Topics Concern   Not on file  Social History Narrative   Lives in [place],  Former Engineer, civil (consulting),  Separated.     Social Determinants of Health   Financial Resource Strain: Not on file  Food Insecurity: Not on file  Transportation Needs: Not on file  Physical Activity: Not on file  Stress: Not on file  Social Connections:  Not on file     Family History:  The patient's family history includes Anesthesia problems in her mother; Colon polyps in her mother.   ROS:   Please see the history of present illness.    All other systems are reviewed and are negative.   PHYSICAL EXAM:   VS:  BP 122/62 (BP Location: Left Arm, Patient Position: Sitting, Cuff Size: Normal)   Pulse 86   Ht 5\' 3"  (1.6 m)   Wt 119 lb 6.4 oz (54.2 kg)   SpO2 90%   BMI 21.15 kg/m      General: Alert, oriented x3, no distress, appears well.  Left subclavian pacer site has healed nicely.  The device distally Prominently due to weight loss.  She is borderline underweight. Head: no evidence of trauma, PERRL, EOMI, no exophtalmos or lid lag, no myxedema, no xanthelasma; normal ears, nose and oropharynx Neck: normal jugular venous pulsations and no hepatojugular reflux; brisk carotid pulses without delay and no carotid bruits Chest: clear to auscultation, no signs of consolidation by percussion or palpation, normal fremitus, symmetrical and full respiratory excursions Cardiovascular: normal position and quality of the apical impulse, regular rhythm, normal first and second heart sounds, no murmurs, rubs or gallops Abdomen: no tenderness or distention, no  masses by palpation, no abnormal pulsatility or arterial bruits, normal bowel sounds, no hepatosplenomegaly Extremities: no clubbing, cyanosis or edema; 2+ radial, ulnar and brachial pulses bilaterally; 2+ right femoral, posterior tibial and dorsalis pedis pulses; 2+ left femoral, posterior tibial and dorsalis pedis pulses; no subclavian or femoral bruits Neurological: grossly nonfocal Psych: Normal mood and affect     Wt Readings from Last 3 Encounters:  01/13/22 119 lb 6.4 oz (54.2 kg)  12/31/21 122 lb 9.6 oz (55.6 kg)  10/07/21 123 lb (55.8 kg)      Studies/Labs Reviewed:   EKG:  EKG is not ordered today.  The intracardiac electrogram shows atrial paced, ventricular sensed rhythm.  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  Recent Labs: 09/19/2021: ALT 16; BUN 10; Creatinine, Ser 0.76; Potassium 4.3; Sodium 132; TSH 0.622 09/30/2021: Hemoglobin 12.3; Platelets 274   Lipid Panel    Component Value Date/Time   CHOL  12/19/2006 0315    172        ATP III CLASSIFICATION:  <200     mg/dL   Desirable  02/19/2007  mg/dL   Borderline High  474-259    mg/dL   High   TRIG >=563 (H) 875 0315   HDL 37 (L) 12/19/2006 0315   CHOLHDL 4.6 12/19/2006 0315   VLDL 59 (H) 12/19/2006 0315   LDLCALC  12/19/2006 0315    76        Total Cholesterol/HDL:CHD Risk Coronary Heart Disease Risk Table                     Men   Women  1/2 Average Risk   3.4   3.3    Additional studies/ records that were reviewed today include:  Notes from recent hospitalization.  ASSESSMENT:    1. Paroxysmal atrial fibrillation (HCC)   2. Encounter for monitoring amiodarone therapy   3. SSS (sick sinus syndrome) (HCC)   4. Pacemaker   5. Essential hypertension        PLAN:  In order of problems listed above:  AFib: After having a good many years of  control with  propafenone she had severe breakthrough arrhythmia and persistent atrial fibrillation, difficult to control RVR and was switched to amiodarone roughly 3 months ago.  Arrhythmia control has improved since then.  Although there are still breakthrough episodes of atrial fibrillation and RVR, overall rate control is very satisfactory and she is asymptomatic.Marland KitchenCHADSVasc 4 (age 39, gender, HTN) without history of stroke or TIA.  Anticoagulation has been discontinued due to her gait instability and frequent falls which have caused serious injuries. Amiodarone: Normal liver and thyroid function tests 3 months ago should be repeated before the end of the year.  Watch carefully for signs or symptoms of digitalis toxicity due to drug interaction.  If she continues to have good arrhythmia control without frequent episodes of RVR, may decide to stop digoxin.  Dig level was 0.5 in April and should be rechecked when she has her thyroid function tests and liver function test repeated.  Also discussed the possibility of multiple drug interactions, the need to protect from excessive sunlight exposure, yearly ophthalmologic exam and the rare but serious possibility of amiodarone lung injury. SSS: She is much more active than before.  Has good heart rate histogram distribution (virtually 100% atrial paced when she is not in atrial fibrillation. PPM: Well-healed after generator change out 3 months ago. HTN: Well-controlled.  Has a history of symptomatic orthostatic hypotension.  Now that arrhythmia is better controlled may decide to discontinue diltiazem, if low blood pressure becomes an issue.   Medication Adjustments/Labs and Tests Ordered: Current medicines are reviewed at length with the patient today.  Concerns regarding medicines are outlined above.  Medication changes, Labs and Tests ordered today are listed in the Patient Instructions below. Patient Instructions  Medication Instructions:  No changes *If you need a  refill on your cardiac medications before your next appointment, please call your pharmacy*   Lab Work: None ordered If you have labs (blood work) drawn today and your tests are completely normal, you will receive your results only by: MyChart Message (if you have MyChart) OR A paper copy in the mail If you have any lab test that is abnormal or we need to change your treatment, we will call you to review the results.   Testing/Procedures: None ordered   Follow-Up: At [facility], you and your health needs are our priority.  As part of our continuing mission to provide you with exceptional heart care, we have created designated Provider Care Teams.  These Care Teams include your primary Cardiologist (physician) and Advanced Practice Providers (APPs -  Physician Assistants and Nurse Practitioners) who all work together to provide you with the care you need, when you need it.  We recommend signing up for the patient portal called "MyChart".  Sign up information is provided on this After Visit Summary.  MyChart is used to connect with patients for Virtual Visits (Telemedicine).  Patients are able to view lab/test results, encounter notes, upcoming appointments, etc.  Non-urgent messages can be sent to your provider as well.   To learn more about what you can do with MyChart, go to [email or web address].    Your next appointment:   12 month(s)  The format for your next appointment:   In Person  Provider:   Dr. [name]  Important Information About Sugar         Signed, Thurmon Fair, MD  01/13/2022 6:28 PM    [facility] [address] [place], [place], [place]  [address] Phone: [phone]; Fax: [phone]   

## 2022-01-20 ENCOUNTER — Other Ambulatory Visit (HOSPITAL_COMMUNITY): Payer: Self-pay

## 2022-01-20 MED ORDER — HYDROXYCHLOROQUINE SULFATE 200 MG PO TABS
300.0000 mg | ORAL_TABLET | Freq: Every day | ORAL | 2 refills | Status: DC
  Filled 2022-01-20: qty 45, 30d supply, fill #0
  Filled 2022-02-12: qty 45, 30d supply, fill #1
  Filled 2022-03-16: qty 45, 30d supply, fill #2

## 2022-01-22 ENCOUNTER — Encounter: Payer: Medicare Other | Admitting: Psychology

## 2022-01-25 ENCOUNTER — Inpatient Hospital Stay (HOSPITAL_COMMUNITY)
Admission: EM | Admit: 2022-01-25 | Discharge: 2022-01-31 | DRG: 689 | Disposition: A | Discharge status: Home Health | Payer: Medicare Other | Attending: Internal Medicine | Admitting: Internal Medicine

## 2022-01-25 ENCOUNTER — Emergency Department (HOSPITAL_COMMUNITY): Payer: Medicare Other

## 2022-01-25 ENCOUNTER — Other Ambulatory Visit: Payer: Self-pay

## 2022-01-25 ENCOUNTER — Encounter (HOSPITAL_COMMUNITY): Payer: Self-pay | Admitting: Emergency Medicine

## 2022-01-25 DIAGNOSIS — Z881 Allergy status to other antibiotic agents status: Secondary | ICD-10-CM

## 2022-01-25 DIAGNOSIS — E039 Hypothyroidism, unspecified: Secondary | ICD-10-CM | POA: Diagnosis present

## 2022-01-25 DIAGNOSIS — D649 Anemia, unspecified: Secondary | ICD-10-CM

## 2022-01-25 DIAGNOSIS — K219 Gastro-esophageal reflux disease without esophagitis: Secondary | ICD-10-CM | POA: Diagnosis present

## 2022-01-25 DIAGNOSIS — R0902 Hypoxemia: Secondary | ICD-10-CM | POA: Diagnosis not present

## 2022-01-25 DIAGNOSIS — Z20822 Contact with and (suspected) exposure to covid-19: Secondary | ICD-10-CM | POA: Diagnosis present

## 2022-01-25 DIAGNOSIS — J9601 Acute respiratory failure with hypoxia: Secondary | ICD-10-CM | POA: Diagnosis present

## 2022-01-25 DIAGNOSIS — Z90710 Acquired absence of both cervix and uterus: Secondary | ICD-10-CM

## 2022-01-25 DIAGNOSIS — N390 Urinary tract infection, site not specified: Principal | ICD-10-CM | POA: Diagnosis present

## 2022-01-25 DIAGNOSIS — E871 Hypo-osmolality and hyponatremia: Secondary | ICD-10-CM | POA: Diagnosis not present

## 2022-01-25 DIAGNOSIS — D638 Anemia in other chronic diseases classified elsewhere: Secondary | ICD-10-CM | POA: Diagnosis present

## 2022-01-25 DIAGNOSIS — Z79891 Long term (current) use of opiate analgesic: Secondary | ICD-10-CM

## 2022-01-25 DIAGNOSIS — D75839 Thrombocytosis, unspecified: Secondary | ICD-10-CM

## 2022-01-25 DIAGNOSIS — Z880 Allergy status to penicillin: Secondary | ICD-10-CM

## 2022-01-25 DIAGNOSIS — I495 Sick sinus syndrome: Secondary | ICD-10-CM | POA: Diagnosis present

## 2022-01-25 DIAGNOSIS — G309 Alzheimer's disease, unspecified: Secondary | ICD-10-CM | POA: Diagnosis present

## 2022-01-25 DIAGNOSIS — J188 Other pneumonia, unspecified organism: Secondary | ICD-10-CM | POA: Diagnosis present

## 2022-01-25 DIAGNOSIS — F02A3 Dementia in other diseases classified elsewhere, mild, with mood disturbance: Secondary | ICD-10-CM | POA: Diagnosis present

## 2022-01-25 DIAGNOSIS — I10 Essential (primary) hypertension: Secondary | ICD-10-CM | POA: Diagnosis not present

## 2022-01-25 DIAGNOSIS — H409 Unspecified glaucoma: Secondary | ICD-10-CM | POA: Diagnosis present

## 2022-01-25 DIAGNOSIS — I110 Hypertensive heart disease with heart failure: Secondary | ICD-10-CM | POA: Diagnosis present

## 2022-01-25 DIAGNOSIS — I5033 Acute on chronic diastolic (congestive) heart failure: Secondary | ICD-10-CM | POA: Diagnosis present

## 2022-01-25 DIAGNOSIS — Z87891 Personal history of nicotine dependence: Secondary | ICD-10-CM

## 2022-01-25 DIAGNOSIS — Z96652 Presence of left artificial knee joint: Secondary | ICD-10-CM | POA: Diagnosis present

## 2022-01-25 DIAGNOSIS — Z950 Presence of cardiac pacemaker: Secondary | ICD-10-CM

## 2022-01-25 DIAGNOSIS — Z79890 Hormone replacement therapy (postmenopausal): Secondary | ICD-10-CM

## 2022-01-25 DIAGNOSIS — Z888 Allergy status to other drugs, medicaments and biological substances status: Secondary | ICD-10-CM

## 2022-01-25 DIAGNOSIS — Z9181 History of falling: Secondary | ICD-10-CM

## 2022-01-25 DIAGNOSIS — Z79899 Other long term (current) drug therapy: Secondary | ICD-10-CM

## 2022-01-25 DIAGNOSIS — I480 Paroxysmal atrial fibrillation: Secondary | ICD-10-CM | POA: Diagnosis present

## 2022-01-25 LAB — URINALYSIS, ROUTINE W REFLEX MICROSCOPIC
Bilirubin Urine: NEGATIVE
Glucose, UA: NEGATIVE mg/dL
Hgb urine dipstick: NEGATIVE
Ketones, ur: NEGATIVE mg/dL
Nitrite: POSITIVE — AB
Protein, ur: 30 mg/dL — AB
Specific Gravity, Urine: 1.018 (ref 1.005–1.030)
pH: 5 (ref 5.0–8.0)

## 2022-01-25 LAB — PROTIME-INR
INR: 1 (ref 0.8–1.2)
Prothrombin Time: 13.2 seconds (ref 11.4–15.2)

## 2022-01-25 LAB — COMPREHENSIVE METABOLIC PANEL
ALT: 10 U/L (ref 0–44)
AST: 21 U/L (ref 15–41)
Albumin: 3.3 g/dL — ABNORMAL LOW (ref 3.5–5.0)
Alkaline Phosphatase: 56 U/L (ref 38–126)
Anion gap: 8 (ref 5–15)
BUN: 18 mg/dL (ref 8–23)
CO2: 23 mmol/L (ref 22–32)
Calcium: 9.1 mg/dL (ref 8.9–10.3)
Chloride: 99 mmol/L (ref 98–111)
Creatinine, Ser: 1.07 mg/dL — ABNORMAL HIGH (ref 0.44–1.00)
GFR, Estimated: 53 mL/min — ABNORMAL LOW (ref >60)
Glucose, Bld: 102 mg/dL — ABNORMAL HIGH (ref 70–99)
Potassium: 4.5 mmol/L (ref 3.5–5.1)
Sodium: 130 mmol/L — ABNORMAL LOW (ref 135–145)
Total Bilirubin: 0.5 mg/dL (ref 0.3–1.2)
Total Protein: 7.6 g/dL (ref 6.5–8.1)

## 2022-01-25 LAB — CBC WITH DIFFERENTIAL/PLATELET
Abs Immature Granulocytes: 0.05 10*3/uL (ref 0.00–0.07)
Basophils Absolute: 0.1 10*3/uL (ref 0.0–0.1)
Basophils Relative: 1 %
Eosinophils Absolute: 0.1 10*3/uL (ref 0.0–0.5)
Eosinophils Relative: 2 %
HCT: 34.6 % — ABNORMAL LOW (ref 36.0–46.0)
Hemoglobin: 11.2 g/dL — ABNORMAL LOW (ref 12.0–15.0)
Immature Granulocytes: 1 %
Lymphocytes Relative: 10 %
Lymphs Abs: 0.8 10*3/uL (ref 0.7–4.0)
MCH: 28.4 pg (ref 26.0–34.0)
MCHC: 32.4 g/dL (ref 30.0–36.0)
MCV: 87.6 fL (ref 80.0–100.0)
Monocytes Absolute: 0.8 10*3/uL (ref 0.1–1.0)
Monocytes Relative: 10 %
Neutro Abs: 6.4 10*3/uL (ref 1.7–7.7)
Neutrophils Relative %: 76 %
Platelets: 413 10*3/uL — ABNORMAL HIGH (ref 150–400)
RBC: 3.95 MIL/uL (ref 3.87–5.11)
RDW: 12.9 % (ref 11.5–15.5)
WBC: 8.3 10*3/uL (ref 4.0–10.5)
nRBC: 0 % (ref 0.0–0.2)

## 2022-01-25 LAB — BRAIN NATRIURETIC PEPTIDE: B Natriuretic Peptide: 16.4 pg/mL (ref 0.0–100.0)

## 2022-01-25 LAB — DIGOXIN LEVEL: Digoxin Level: 0.6 ng/mL — ABNORMAL LOW (ref 0.8–2.0)

## 2022-01-25 LAB — LACTIC ACID, PLASMA: Lactic Acid, Venous: 1 mmol/L (ref 0.5–1.9)

## 2022-01-25 LAB — APTT: aPTT: 33 seconds (ref 24–36)

## 2022-01-25 LAB — SARS CORONAVIRUS 2 BY RT PCR: SARS Coronavirus 2 by RT PCR: NEGATIVE

## 2022-01-25 MED ORDER — ENOXAPARIN SODIUM 40 MG/0.4ML IJ SOSY
40.0000 mg | PREFILLED_SYRINGE | Freq: Every day | INTRAMUSCULAR | Status: DC
  Administered 2022-01-25 – 2022-01-30 (×6): 40 mg via SUBCUTANEOUS
  Filled 2022-01-25 (×6): qty 0.4

## 2022-01-25 MED ORDER — ALBUTEROL SULFATE (2.5 MG/3ML) 0.083% IN NEBU: 2.5000 mg | INHALATION_SOLUTION | RESPIRATORY_TRACT | Status: DC | PRN

## 2022-01-25 MED ORDER — ONDANSETRON HCL 4 MG/2ML IJ SOLN: 4.0000 mg | Freq: Four times a day (QID) | INTRAMUSCULAR | Status: DC | PRN

## 2022-01-25 MED ORDER — LACTATED RINGERS IV SOLN: INTRAVENOUS | Status: AC

## 2022-01-25 MED ORDER — IPRATROPIUM-ALBUTEROL 0.5-2.5 (3) MG/3ML IN SOLN
3.0000 mL | Freq: Four times a day (QID) | RESPIRATORY_TRACT | Status: DC
Start: 2022-01-25 — End: 2022-01-25
  Administered 2022-01-25: 3 mL via RESPIRATORY_TRACT
  Filled 2022-01-25: qty 3

## 2022-01-25 MED ORDER — BENZONATATE 100 MG PO CAPS
200.0000 mg | ORAL_CAPSULE | Freq: Three times a day (TID) | ORAL | Status: DC | PRN
  Administered 2022-01-29 (×2): 200 mg via ORAL
  Filled 2022-01-25 (×4): qty 2

## 2022-01-25 MED ORDER — ENOXAPARIN SODIUM 40 MG/0.4ML IJ SOSY: 40.0000 mg | PREFILLED_SYRINGE | Freq: Every day | INTRAMUSCULAR | Status: DC

## 2022-01-25 MED ORDER — LEVOFLOXACIN IN D5W 750 MG/150ML IV SOLN
750.0000 mg | Freq: Once | INTRAVENOUS | Status: AC
  Administered 2022-01-25: 750 mg via INTRAVENOUS
  Filled 2022-01-25: qty 150

## 2022-01-25 MED ORDER — SENNOSIDES-DOCUSATE SODIUM 8.6-50 MG PO TABS
1.0000 | ORAL_TABLET | Freq: Every evening | ORAL | Status: DC | PRN
  Administered 2022-01-28 – 2022-01-29 (×2): 1 via ORAL
  Filled 2022-01-25 (×2): qty 1

## 2022-01-25 MED ORDER — LACTATED RINGERS IV SOLN: INTRAVENOUS | Status: DC

## 2022-01-25 MED ORDER — OXYCODONE HCL 5 MG PO TABS
5.0000 mg | ORAL_TABLET | ORAL | Status: DC | PRN
  Administered 2022-01-25 – 2022-01-31 (×13): 5 mg via ORAL
  Filled 2022-01-25 (×13): qty 1

## 2022-01-25 MED ORDER — ACETAMINOPHEN 325 MG PO TABS
650.0000 mg | ORAL_TABLET | Freq: Four times a day (QID) | ORAL | Status: DC | PRN
  Administered 2022-01-26: 650 mg via ORAL
  Filled 2022-01-25: qty 2

## 2022-01-25 MED ORDER — ONDANSETRON HCL 4 MG PO TABS: 4.0000 mg | ORAL_TABLET | Freq: Four times a day (QID) | ORAL | Status: DC | PRN

## 2022-01-25 MED ORDER — IPRATROPIUM-ALBUTEROL 0.5-2.5 (3) MG/3ML IN SOLN
3.0000 mL | Freq: Three times a day (TID) | RESPIRATORY_TRACT | Status: DC
Start: 2022-01-26 — End: 2022-01-31
  Administered 2022-01-26 – 2022-01-31 (×17): 3 mL via RESPIRATORY_TRACT
  Filled 2022-01-25 (×17): qty 3

## 2022-01-25 MED ORDER — LEVOFLOXACIN IN D5W 750 MG/150ML IV SOLN
750.0000 mg | INTRAVENOUS | Status: AC
  Administered 2022-01-27 – 2022-01-29 (×2): 750 mg via INTRAVENOUS
  Filled 2022-01-25 (×2): qty 150

## 2022-01-25 MED ORDER — ACETAMINOPHEN 650 MG RE SUPP: 650.0000 mg | Freq: Four times a day (QID) | RECTAL | Status: DC | PRN

## 2022-01-25 MED ORDER — GUAIFENESIN-DM 100-10 MG/5ML PO SYRP
10.0000 mL | ORAL_SOLUTION | ORAL | Status: DC | PRN
Start: 2022-01-25 — End: 2022-01-31
  Administered 2022-01-26 – 2022-01-29 (×5): 10 mL via ORAL
  Filled 2022-01-25 (×5): qty 10

## 2022-01-25 NOTE — ED Notes (Signed)
Called lab to add urine culture on to urine in lab

## 2022-01-25 NOTE — ED Triage Notes (Signed)
BIB EMS from [facility], productive cough, walking PNA, UC did CXR and she had PNA. Rhonchi in lower lobes. She is hypoxic, pulse oxygen was 91% in office. 97% on 2 L w/ EMS.

## 2022-01-25 NOTE — ED Notes (Signed)
After ambulating, pt's oxygen saturation dropped to 81%.

## 2022-01-25 NOTE — H&P (Signed)
History and Physical    Jacqueline Kim ZOX:[id number] DOB: [birth year] DOA: 01/25/2022  PCP: Georgianne Fick, MD   Patient coming from: Home  I have personally briefly reviewed patient's old medical records in [facility]  Chief Complaint: Weakness and shortness of breath  HPI: Jacqueline Kim is a 76 y.o. female with medical history significant of paroxysmal A-fib not on anticoagulation, hypertension, sick sinus syndrome status post permanent pacemaker, hypothyroidism, depression presents with generalized weakness and worsening shortness of breath. Patient apparently has been having URI-like symptoms for the last 5 days along with cough mostly nonproductive but sounding wet but without any fevers or chills.  Patient went to urgent care, oxygen saturations were found in the low 90s but dropped into the 80s on ambulation and chest x-ray showed?  Multifocal pneumonia.  Patient was sent to the ED.  No chest pain, loss of consciousness, seizures, recent travel, sick contacts, diarrhea.  Patient has had urinary urgency and frequency.  ED Course: UA suggestive of UTI.  Chest x-ray showed no acute abnormality.  Sodium 130.  Upon ambulation, oxygen saturations dropped to 78% on room air. Hospitalist service was called to evaluate the patient.  Review of Systems: Patient is a poor historian because of being forgetful started laboratory is not that great.  Review of systems could not be appropriately done.  Past Medical History:  Diagnosis Date   Anticoagulant long-term use  WITH PRADAXA 05/27/2011   Anxiety    Cellulitis of buttock, left 12/20/2014   Complication of anesthesia    DIFFICULTY WAKING UP   Depression    Diverticulosis    DJD (degenerative joint disease)    Esophagitis 09/17/06   External hemorrhoids    GERD (gastroesophageal reflux disease)    Glaucoma    History of stress test    a. 2008 - normal nuc.   Hypertension    Hypothyroid    IBS (irritable bowel syndrome)     Paroxysmal A-fib (HCC) diagnosed 2008   a. On propafenone, Pradaxa.   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   s/p MDT PPM by Dr Rubie Maid    Past Surgical History:  Procedure Laterality Date   BREAST REDUCTION SURGERY     BUNIONECTOMY Right 07/09/2015   with rods and pins   CARDIOVERSION N/A 08/02/2014   Procedure: CARDIOVERSION;  Surgeon: Quintella Reichert, MD;  Location: [facility] ENDOSCOPY;  Service: Cardiovascular;  Laterality: N/A;   CATARACT EXTRACTION, BILATERAL Bilateral    FACIAL COSMETIC SURGERY     FOOT SURGERY Left    HAND SURGERY     multiple   LIPOSUCTION     NM MYOCAR PERF WALL MOTION  12/29/2006   No significant ischemia; EF 69%   OVARIAN CYST REMOVAL     PACEMAKER INSERTION  04/08/11   MDT Revo implanted by Dr Rubie Maid   PPM GENERATOR CHANGEOUT N/A 10/07/2021   Procedure: PPM GENERATOR CHANGEOUT;  Surgeon: Thurmon Fair, MD;  Location: [facility] INVASIVE CV LAB;  Service: Cardiovascular;  Laterality: N/A;   TONSILLECTOMY     TOTAL KNEE ARTHROPLASTY Left 05/01/2015   Procedure: TOTAL LEFT KNEE ARTHROPLASTY;  Surgeon: Durene Romans, MD;  Location: [facility] ORS;  Service: Orthopedics;  Laterality: Left;   VAGINAL HYSTERECTOMY       reports that she quit smoking about 25 years ago. Her smoking use included cigarettes. She has never used smokeless tobacco. She reports current alcohol use of about 14.0  standard drinks of alcohol per week. She reports that she does not use drugs.  Allergies  Allergen Reactions   Azithromycin Other (See Comments)    Causes heart rhythm issues    Epinephrine Other (See Comments)    Causes A-Fib    Penicillins Anaphylaxis    Has patient had a PCN reaction causing immediate rash, facial/tongue/throat swelling, SOB or lightheadedness with hypotension: Yes Has patient had a PCN reaction causing severe rash involving mucus membranes or skin necrosis: No Has patient had a PCN reaction that  required hospitalization No Has patient had a PCN reaction occurring within the last 10 years: No If all of the above answers are "NO", then may proceed with Cephalosporin use.    Lidocaine Palpitations   Metoprolol Other (See Comments)    alopecia     Family History  Problem Relation Age of Onset   Anesthesia problems Mother    Colon polyps Mother        beign   Colon cancer Neg Hx    Dementia Neg Hx     Prior to Admission medications   Medication Sig Start Date End Date Taking? Authorizing Provider  amiodarone (PACERONE) 200 MG tablet Take 1 tablet (200 mg total) by mouth daily. 09/23/21   Sherran Needs, NP  atenolol (TENORMIN) 50 MG tablet Take 50 mg by mouth daily as needed. Patient not taking: Reported on 01/13/2022    [provider]  clobetasol (TEMOVATE) 0.05 % external solution Apply 1 application topically 2  times daily to affected area(s) as needed, Do not apply to face,groin or armpit Patient not taking: Reported on 01/13/2022 10/30/21     Digoxin 62.5 MCG TABS Take 1 tablet by mouth every other day. 11/26/21   Croitoru, Mihai, MD  diltiazem (CARTIA XT) 120 MG 24 hr capsule Take 1 capsule (120 mg total) by mouth at bedtime. Keep upcoming appointment for future refills. 10/25/21   Troy Sine, MD  escitalopram (LEXAPRO) 20 MG tablet Take 1 tablet (20 mg total) by mouth daily. 03/04/21     HYDROcodone-acetaminophen (NORCO/VICODIN) 5-325 MG tablet Take 1 tablet by mouth every 6 (six) hours as needed. 11/15/21     HYDROcodone-acetaminophen (NORCO/VICODIN) 5-325 MG tablet Take 1 tablet by mouth every 6 (six) hours as needed. 01/13/22     hydroxychloroquine (PLAQUENIL) 200 MG tablet Take 1.5 tablets (300 mg total) by mouth daily with food or milk 01/20/22     levothyroxine (SYNTHROID) 112 MCG tablet Take 1 tablet (112 mcg total) by mouth daily. 12/02/21     nitrofurantoin (MACRODANTIN) 50 MG capsule Take 1 capsule (50 mg total) by mouth daily. 08/12/21     traZODone  (DESYREL) 150 MG tablet Take 0.5 tablets (75 mg total) by mouth once a day in the evening. 01/03/22     triamcinolone cream (KENALOG) 0.1 % Apply 1 application to affected area(s) up to 2 (two) times daily as needed. Do not apply to face, groin, or armpit. 01/10/21       Physical Exam: Vitals:   01/25/22 1323 01/25/22 1610 01/25/22 1630 01/25/22 1700  BP:  (!) 154/70 (!) 147/66 (!) 150/70  Pulse:  78 63 81  Resp:  (!) 23 20 20   Temp:      TempSrc:      SpO2:  100% 97% 94%  Weight: 53.5 kg     Height: 5\' 3"  (1.6 m)       Constitutional: NAD, calm, comfortable.  Intermittently requiring supplemental oxygen.  Elderly female lying in bed. Vitals:   01/25/22 1323 01/25/22 1610 01/25/22 1630 01/25/22 1700  BP:  (!) 154/70 (!) 147/66 (!) 150/70  Pulse:  78 63 81  Resp:  (!) 23 20 20   Temp:      TempSrc:      SpO2:  100% 97% 94%  Weight: 53.5 kg     Height: 5\' 3"  (1.6 m)      Eyes: PERRL, lids and conjunctivae normal ENMT: Mucous membranes are moist. Posterior pharynx clear of any exudate or lesions. Neck: normal, supple, no masses, no thyromegaly Respiratory: bilateral decreased breath sounds at bases, no wheezing, scattered crackles present.  Minimally tachypneic.  No accessory muscle use.  Cardiovascular: S1 S2 positive, rate controlled.  Trace lower extremity edema present. 2+ pedal pulses.  Abdomen: no tenderness, no masses palpated. No hepatosplenomegaly. Bowel sounds positive.  Musculoskeletal: no clubbing / cyanosis. No joint deformity upper and lower extremities.  Skin: no rashes, lesions, ulcers. No induration Neurologic: CN 2-12 grossly intact. Moving extremities. No focal neurologic deficits.  Poor historian.  Slow to respond.  Slightly confused to time. Psychiatric: Flat affect.  Currently showing no signs of agitation.   Labs on Admission: I have personally reviewed following labs and imaging studies  CBC: Recent Labs  Lab 01/25/22 1454  WBC 8.3  NEUTROABS 6.4   HGB 11.2*  HCT 34.6*  MCV 87.6  PLT 413*   Basic Metabolic Panel: Recent Labs  Lab 01/25/22 1454  NA 130*  K 4.5  CL 99  CO2 23  GLUCOSE 102*  BUN 18  CREATININE 1.07*  CALCIUM 9.1   GFR: Estimated Creatinine Clearance: 35.8 mL/min (A) (by C-G formula based on SCr of 1.07 mg/dL (H)). Liver Function Tests: Recent Labs  Lab 01/25/22 1454  AST 21  ALT 10  ALKPHOS 56  BILITOT 0.5  PROT 7.6  ALBUMIN 3.3*   No results for input(s): "LIPASE", "AMYLASE" in the last 168 hours. No results for input(s): "AMMONIA" in the last 168 hours. Coagulation Profile: Recent Labs  Lab 01/25/22 1455  INR 1.0   Cardiac Enzymes: No results for input(s): "CKTOTAL", "CKMB", "CKMBINDEX", "TROPONINI" in the last 168 hours. BNP (last 3 results) No results for input(s): "PROBNP" in the last 8760 hours. HbA1C: No results for input(s): "HGBA1C" in the last 72 hours. CBG: No results for input(s): "GLUCAP" in the last 168 hours. Lipid Profile: No results for input(s): "CHOL", "HDL", "LDLCALC", "TRIG", "CHOLHDL", "LDLDIRECT" in the last 72 hours. Thyroid Function Tests: No results for input(s): "TSH", "T4TOTAL", "FREET4", "T3FREE", "THYROIDAB" in the last 72 hours. Anemia Panel: No results for input(s): "VITAMINB12", "FOLATE", "FERRITIN", "TIBC", "IRON", "RETICCTPCT" in the last 72 hours. Urine analysis:    Component Value Date/Time   COLORURINE YELLOW 01/25/2022 1501   APPEARANCEUR HAZY (A) 01/25/2022 1501   LABSPEC 1.018 01/25/2022 1501   PHURINE 5.0 01/25/2022 1501   GLUCOSEU NEGATIVE 01/25/2022 1501   HGBUR NEGATIVE 01/25/2022 1501   BILIRUBINUR NEGATIVE 01/25/2022 1501   KETONESUR NEGATIVE 01/25/2022 1501   PROTEINUR 30 (A) 01/25/2022 1501   UROBILINOGEN 0.2 04/24/2015 1423   NITRITE POSITIVE (A) 01/25/2022 1501   LEUKOCYTESUR MODERATE (A) 01/25/2022 1501    Radiological Exams on Admission: I have reviewed the chest x-ray myself: No infiltrates noted DG Chest 2 View  Result  Date: 01/25/2022 CLINICAL DATA:  Shortness of breath. EXAM: CHEST - 2 VIEW COMPARISON:  Chest x-ray 08/04/2020 FINDINGS: Left-sided pacemaker is unchanged. External artifact overlies the chest. The heart  size and mediastinal contours are within normal limits. Both lungs are clear. The visualized skeletal structures are unremarkable. IMPRESSION: No active cardiopulmonary disease. Electronically Signed   By: Ronney Asters M.D.   On: 01/25/2022 15:43    EKG: Independently reviewed.  No ST elevations or depressions  Assessment/Plan  UTI: Present on admission -Levaquin started in the ED.  Continue Levaquin.  Follow urine cultures.  Hypoxia ?  Acute bacterial/viral bronchitis -Chest x-ray negative for pneumonia.  Check procalcitonin in AM.  Oxygen supplementation as needed and wean off as able. -Nebs as needed.  Physical deconditioning/generalized weakness -Probably from above.  PT eval  ?  Alzheimer's dementia -Patient is unaware of having dementia.  She is slightly confused to time. -Delirium precautions.  Fall precautions.  PT eval.  Hypertension -Monitor blood pressure.  Resume home regimen once medications are verified  Paroxysmal A-fib not on anticoagulation -Currently rate controlled -Not on anticoagulation because of high risk of falls.  Outpatient follow-up with cardiology.  Resume home regimen once educations are verified  Hypothyroidism -Resume home regimen.  Anemia of chronic disease -Possibly from chronic illnesses.  Hemoglobin stable.  No signs of bleeding  Thrombocytosis -Possibly reactive.  Monitor intermittently  Hyponatremia -Mild.  Monitor.  Encourage oral hydration  Sick sinus syndrome status post permanent pacemaker placement -Outpatient follow-up with cardiology  Depression--resume home regimen once verified   DVT prophylaxis: Lovenox Code Status: Full Family Communication: None at bedside Disposition Plan: Home in 1 to 2 days once clinically  improved Consults called: None Admission status: Observation/MedSurg  Severity of Illness: The appropriate patient status for this patient is OBSERVATION. Observation status is judged to be reasonable and necessary in order to provide the required intensity of service to ensure the patient's safety. The patient's presenting symptoms, physical exam findings, and initial radiographic and laboratory data in the context of their medical condition is felt to place them at decreased risk for further clinical deterioration. Furthermore, it is anticipated that the patient will be medically stable for discharge from the hospital within 2 midnights of admission.     Aline August MD [facility]  01/25/2022, 5:09 PM

## 2022-01-25 NOTE — ED Provider Notes (Signed)
[facility] DEPT Provider Note   CSN: [id number] Arrival date & time: 01/25/22  1313     History  Chief Complaint  Patient presents with   Pneumonia   hypoxia    Jacqueline Kim is a 76 y.o. female.  HPI     76 year old female with history of paroxysmal A-fib not on anticoagulation, Alzheimer's disease, hypertension, CHF comes in with chief complaint of abnormal x-rays.  Patient accompanied by her daughter.  According to the patient's daughter, patient has been having URI-like symptoms for the last 5 days or so.  She has cough, mostly nonproductive but sounds wet.  Patient has not had any fevers or chills.   They went to the urgent care, patient was found to have O2 sats in the low 90s.  Upon ambulation her O2 sats dropped to 80%.  Patient's x-ray had shown multifocal pneumonia.  Patient and family deny any weight gain.  She did have orthopnea-like symptoms on 1 occasion.  She does think that the cough is worse at night.  There is no history of DVT, PE and in general patient is pretty active at home.  Review of systems also positive for urinary frequency and urgency.  Home Medications Prior to Admission medications   Medication Sig Start Date End Date Taking? Authorizing Provider  amiodarone (PACERONE) 200 MG tablet Take 1 tablet (200 mg total) by mouth daily. 09/23/21   Sherran Needs, NP  atenolol (TENORMIN) 50 MG tablet Take 50 mg by mouth daily as needed. Patient not taking: Reported on 01/13/2022    [provider]  clobetasol (TEMOVATE) 0.05 % external solution Apply 1 application topically 2  times daily to affected area(s) as needed, Do not apply to face,groin or armpit Patient not taking: Reported on 01/13/2022 10/30/21     Digoxin 62.5 MCG TABS Take 1 tablet by mouth every other day. 11/26/21   Croitoru, Mihai, MD  diltiazem (CARTIA XT) 120 MG 24 hr capsule Take 1 capsule (120 mg total) by mouth at bedtime. Keep upcoming appointment  for future refills. 10/25/21   Troy Sine, MD  escitalopram (LEXAPRO) 20 MG tablet Take 1 tablet (20 mg total) by mouth daily. 03/04/21     HYDROcodone-acetaminophen (NORCO/VICODIN) 5-325 MG tablet Take 1 tablet by mouth every 6 (six) hours as needed. 11/15/21     HYDROcodone-acetaminophen (NORCO/VICODIN) 5-325 MG tablet Take 1 tablet by mouth every 6 (six) hours as needed. 01/13/22     hydroxychloroquine (PLAQUENIL) 200 MG tablet Take 1.5 tablets (300 mg total) by mouth daily with food or milk 01/20/22     levothyroxine (SYNTHROID) 112 MCG tablet Take 1 tablet (112 mcg total) by mouth daily. 12/02/21     nitrofurantoin (MACRODANTIN) 50 MG capsule Take 1 capsule (50 mg total) by mouth daily. 08/12/21     traZODone (DESYREL) 150 MG tablet Take 0.5 tablets (75 mg total) by mouth once a day in the evening. 01/03/22     triamcinolone cream (KENALOG) 0.1 % Apply 1 application to affected area(s) up to 2 (two) times daily as needed. Do not apply to face, groin, or armpit. 01/10/21         Allergies    Azithromycin, Epinephrine, Penicillins, Lidocaine, and Metoprolol    Review of Systems   Review of Systems  All other systems reviewed and are negative.   Physical Exam Updated Vital Signs BP (!) 150/70   Pulse 81   Temp 98.1 F (36.7 C) (Oral)  Resp 20   Ht 5\' 3"  (1.6 m)   Wt 53.5 kg   SpO2 94%   BMI 20.90 kg/m  Physical Exam Vitals and nursing note reviewed.  Constitutional:      Appearance: She is well-developed.  HENT:     Head: Atraumatic.  Cardiovascular:     Rate and Rhythm: Normal rate.  Pulmonary:     Effort: Pulmonary effort is normal.     Comments: Bilateral lower extremity rhonchi, right worse than left Musculoskeletal:        General: No swelling.     Cervical back: Normal range of motion and neck supple.     Right lower leg: No edema.     Left lower leg: No edema.  Skin:    General: Skin is warm and dry.  Neurological:     Mental Status: She is alert and oriented to  person, place, and time.     ED Results / Procedures / Treatments   Labs (all labs ordered are listed, but only abnormal results are displayed) Labs Reviewed  CBC WITH DIFFERENTIAL/PLATELET - Abnormal; Notable for the following components:      Result Value   Hemoglobin 11.2 (*)    HCT 34.6 (*)    Platelets 413 (*)    All other components within normal limits  COMPREHENSIVE METABOLIC PANEL - Abnormal; Notable for the following components:   Sodium 130 (*)    Glucose, Bld 102 (*)    Creatinine, Ser 1.07 (*)    Albumin 3.3 (*)    GFR, Estimated 53 (*)    All other components within normal limits  URINALYSIS, ROUTINE W REFLEX MICROSCOPIC - Abnormal; Notable for the following components:   APPearance HAZY (*)    Protein, ur 30 (*)    Nitrite POSITIVE (*)    Leukocytes,Ua MODERATE (*)    Bacteria, UA MANY (*)    All other components within normal limits  SARS CORONAVIRUS 2 BY RT PCR  CULTURE, BLOOD (SINGLE)  CULTURE, BLOOD (SINGLE)  URINE CULTURE  LACTIC ACID, PLASMA  PROTIME-INR  APTT  BRAIN NATRIURETIC PEPTIDE  DIGOXIN LEVEL  CBC  CREATININE, SERUM  CBC  COMPREHENSIVE METABOLIC PANEL  MAGNESIUM    EKG EKG Interpretation  Date/Time:  Saturday January 25 2022 13:28:45 EDT Ventricular Rate:  61 PR Interval:  244 QRS Duration: 121 QT Interval:  458 QTC Calculation: 462 R Axis:   -45 Text Interpretation: Sinus or ectopic atrial rhythm Prolonged PR interval Left atrial enlargement Left bundle branch block No acute changes No significant change since last tracing Confirmed by [id number] Derwood Kaplan) on 01/25/2022 3:58:01 PM  Radiology DG Chest 2 View  Result Date: 01/25/2022 CLINICAL DATA:  Shortness of breath. EXAM: CHEST - 2 VIEW COMPARISON:  Chest x-ray 08/04/2020 FINDINGS: Left-sided pacemaker is unchanged. External artifact overlies the chest. The heart size and mediastinal contours are within normal limits. Both lungs are clear. The visualized skeletal structures  are unremarkable. IMPRESSION: No active cardiopulmonary disease. Electronically Signed   By: 08/06/2020 M.D.   On: 01/25/2022 15:43    Procedures .Critical Care  Performed by: 03/27/2022, MD Authorized by: Derwood Kaplan, MD   Critical care provider statement:    Critical care time (minutes):  32   Critical care was necessary to treat or prevent imminent or life-threatening deterioration of the following conditions:  Respiratory failure   Critical care was time spent personally by me on the following activities:  Development of treatment plan with  patient or surrogate, discussions with consultants, evaluation of patient's response to treatment, examination of patient, ordering and review of laboratory studies, ordering and review of radiographic studies, ordering and performing treatments and interventions, pulse oximetry, re-evaluation of patient's condition and review of old charts     Medications Ordered in ED Medications  levofloxacin (LEVAQUIN) IVPB 750 mg (750 mg Intravenous New Bag/Given 01/25/22 1629)  levofloxacin (LEVAQUIN) IVPB 750 mg (has no administration in time range)  lactated ringers infusion (has no administration in time range)  enoxaparin (LOVENOX) injection 40 mg (has no administration in time range)  acetaminophen (TYLENOL) tablet 650 mg (has no administration in time range)    Or  acetaminophen (TYLENOL) suppository 650 mg (has no administration in time range)  oxyCODONE (Oxy IR/ROXICODONE) immediate release tablet 5 mg (has no administration in time range)  ondansetron (ZOFRAN) tablet 4 mg (has no administration in time range)    Or  ondansetron (ZOFRAN) injection 4 mg (has no administration in time range)  senna-docusate (Senokot-S) tablet 1 tablet (has no administration in time range)  albuterol (PROVENTIL) (2.5 MG/3ML) 0.083% nebulizer solution 2.5 mg (has no administration in time range)  ipratropium-albuterol (DUONEB) 0.5-2.5 (3) MG/3ML nebulizer  solution 3 mL (has no administration in time range)    ED Course/ Medical Decision Making/ A&P Clinical Course as of 01/25/22 1712  Sat Jan 25, 2022  1707 DG Chest 2 View X-ray visualized and interpreted independently.  Although there are some areas of opacity appreciated, the x-ray does not look completely different than previous x-ray.  No significant evidence of volume overload.  No pleural effusion [AN]  1707 CBC with Differential(!) Patient CBC is normal.  Metabolic profile is reassuring.  Lactic acid is normal.  COVID-19 test is negative.  Upon ambulation of the patient, O2 sats dropped to 78% on room air.  Differential diagnosis will be brought in the little bit.  BNP added to the work-up.  No signs of DVT.  No history of DVT and no risk factors for the same. We will discuss the case with admitting service on the next best step [AN]  1708 Discussed case with the hospitalist.  Informed him that clinically this sounds like a pneumonia and less likely PE.  Asked them if they would want [place] to his get CT angio or admit the patient with antibiotics and add CT if needed.  The hospitalist service is comfortable admitting the patient and they will add CT scan if needed.  [AN]    Clinical Course User Index [AN] Varney Biles, MD                           Medical Decision Making Amount and/or Complexity of Data Reviewed Labs: ordered. Radiology: ordered.  Risk Prescription drug management.   This patient presents to the ED with chief complaint(s) of exertional shortness of breath, cough with pertinent past medical history of CHF, A-fib not on any anticoagulation which further complicates the presenting complaint. The complaint involves an extensive differential diagnosis and also carries with it a high risk of complications and morbidity.    The differential diagnosis includes : Pleural effusion, pulmonary edema, multifocal pneumonia, PE, severe anemia, CHF exacerbation, acute coronary  syndrome  The initial plan is to get basic labs, appropriate imaging.   Additional history obtained: Additional history obtained from family Records reviewed previous admission documents and previous PCP visit.  Independent labs interpretation:  The following labs were independently interpreted:  See the work-up tab  Independent visualization of imaging: - I independently visualized the following imaging with scope of interpretation limited to determining acute life threatening conditions related to emergency care: X-ray of the chest, which revealed no evidence of pleural effusion, pulmonary edema.  Still see areas of opacity.  Treatment and Reassessment: See work-up tab above     Final Clinical Impression(s) / ED Diagnoses Final diagnoses:  Acute hypoxemic respiratory failure [facility])    Rx / DC Orders ED Discharge Orders     None         Derwood Kaplan, MD 01/25/22 1712

## 2022-01-25 NOTE — ED Notes (Signed)
Called for purple man  

## 2022-01-25 NOTE — Plan of Care (Signed)
  Problem: Education: Goal: Knowledge of General Education information will improve Description: Including pain rating scale, medication(s)/side effects and non-pharmacologic comfort measures Outcome: Progressing   Problem: Activity: Goal: Risk for activity intolerance will decrease Outcome: Progressing   Problem: Pain Managment: Goal: General experience of comfort will improve Outcome: Progressing   

## 2022-01-25 NOTE — ED Provider Triage Note (Signed)
Emergency Medicine Provider Triage Evaluation Note  Jacqueline Kim , a 76 y.o. female  was evaluated in triage.  Pt complains of shortness of breath.  Patient presents to the hospital via EMS from urgent care where she was seen and diagnosed with walking pneumonia.  Rhonchi noted in lower lobes.  Patient had productive cough for approximately 1 week.  She was hypoxic at the urgent care with oxygen saturation of 91% on room air.  Patient on 2 L at this time with oxygen saturations in the upper 90% range.  Patient also complains of vague urinary symptoms.  Denies chest pain, abdominal pain, nausea, vomiting  Review of Systems  Positive: Shortness of breath, urinary symptoms Negative: Chest pain, abdominal pain  Physical Exam  BP 133/67 (BP Location: Right Arm)   Pulse 63   Temp 98.1 F (36.7 C) (Oral)   Resp 18   Ht 5\' 3"  (1.6 m)   Wt 53.5 kg   SpO2 99%   BMI 20.90 kg/m  Gen:   Awake, no distress   Resp:  Normal effort  MSK:   Moves extremities without difficulty  Other:    Medical Decision Making  Medically screening exam initiated at 1:45 PM.  Appropriate orders placed.  KEANU FRICKEY was informed that the remainder of the evaluation will be completed by another provider, this initial triage assessment does not replace that evaluation, and the importance of remaining in the ED until their evaluation is complete.     [name], PA-C 01/25/22 1346

## 2022-01-26 DIAGNOSIS — Z950 Presence of cardiac pacemaker: Secondary | ICD-10-CM | POA: Diagnosis not present

## 2022-01-26 DIAGNOSIS — Z79891 Long term (current) use of opiate analgesic: Secondary | ICD-10-CM | POA: Diagnosis not present

## 2022-01-26 DIAGNOSIS — J188 Other pneumonia, unspecified organism: Secondary | ICD-10-CM | POA: Diagnosis present

## 2022-01-26 DIAGNOSIS — Z881 Allergy status to other antibiotic agents status: Secondary | ICD-10-CM | POA: Diagnosis not present

## 2022-01-26 DIAGNOSIS — I480 Paroxysmal atrial fibrillation: Secondary | ICD-10-CM | POA: Diagnosis present

## 2022-01-26 DIAGNOSIS — N390 Urinary tract infection, site not specified: Secondary | ICD-10-CM | POA: Diagnosis present

## 2022-01-26 DIAGNOSIS — D649 Anemia, unspecified: Secondary | ICD-10-CM | POA: Diagnosis not present

## 2022-01-26 DIAGNOSIS — F02A3 Dementia in other diseases classified elsewhere, mild, with mood disturbance: Secondary | ICD-10-CM | POA: Diagnosis present

## 2022-01-26 DIAGNOSIS — R0602 Shortness of breath: Secondary | ICD-10-CM | POA: Diagnosis not present

## 2022-01-26 DIAGNOSIS — I10 Essential (primary) hypertension: Secondary | ICD-10-CM | POA: Diagnosis not present

## 2022-01-26 DIAGNOSIS — H409 Unspecified glaucoma: Secondary | ICD-10-CM | POA: Diagnosis present

## 2022-01-26 DIAGNOSIS — D638 Anemia in other chronic diseases classified elsewhere: Secondary | ICD-10-CM | POA: Diagnosis present

## 2022-01-26 DIAGNOSIS — I110 Hypertensive heart disease with heart failure: Secondary | ICD-10-CM | POA: Diagnosis present

## 2022-01-26 DIAGNOSIS — J9601 Acute respiratory failure with hypoxia: Secondary | ICD-10-CM | POA: Diagnosis present

## 2022-01-26 DIAGNOSIS — D75839 Thrombocytosis, unspecified: Secondary | ICD-10-CM | POA: Diagnosis present

## 2022-01-26 DIAGNOSIS — I5033 Acute on chronic diastolic (congestive) heart failure: Secondary | ICD-10-CM | POA: Diagnosis present

## 2022-01-26 DIAGNOSIS — Z79899 Other long term (current) drug therapy: Secondary | ICD-10-CM | POA: Diagnosis not present

## 2022-01-26 DIAGNOSIS — Z20822 Contact with and (suspected) exposure to covid-19: Secondary | ICD-10-CM | POA: Diagnosis present

## 2022-01-26 DIAGNOSIS — Z888 Allergy status to other drugs, medicaments and biological substances status: Secondary | ICD-10-CM | POA: Diagnosis not present

## 2022-01-26 DIAGNOSIS — E039 Hypothyroidism, unspecified: Secondary | ICD-10-CM | POA: Diagnosis present

## 2022-01-26 DIAGNOSIS — Z880 Allergy status to penicillin: Secondary | ICD-10-CM | POA: Diagnosis not present

## 2022-01-26 DIAGNOSIS — G309 Alzheimer's disease, unspecified: Secondary | ICD-10-CM | POA: Diagnosis present

## 2022-01-26 DIAGNOSIS — K219 Gastro-esophageal reflux disease without esophagitis: Secondary | ICD-10-CM | POA: Diagnosis present

## 2022-01-26 DIAGNOSIS — R0902 Hypoxemia: Secondary | ICD-10-CM | POA: Diagnosis not present

## 2022-01-26 DIAGNOSIS — I495 Sick sinus syndrome: Secondary | ICD-10-CM | POA: Diagnosis present

## 2022-01-26 DIAGNOSIS — E871 Hypo-osmolality and hyponatremia: Secondary | ICD-10-CM | POA: Diagnosis present

## 2022-01-26 DIAGNOSIS — Z96652 Presence of left artificial knee joint: Secondary | ICD-10-CM | POA: Diagnosis present

## 2022-01-26 DIAGNOSIS — Z79890 Hormone replacement therapy (postmenopausal): Secondary | ICD-10-CM | POA: Diagnosis not present

## 2022-01-26 LAB — COMPREHENSIVE METABOLIC PANEL
ALT: 9 U/L (ref 0–44)
AST: 18 U/L (ref 15–41)
Albumin: 2.7 g/dL — ABNORMAL LOW (ref 3.5–5.0)
Alkaline Phosphatase: 45 U/L (ref 38–126)
Anion gap: 8 (ref 5–15)
BUN: 13 mg/dL (ref 8–23)
CO2: 22 mmol/L (ref 22–32)
Calcium: 8.6 mg/dL — ABNORMAL LOW (ref 8.9–10.3)
Chloride: 102 mmol/L (ref 98–111)
Creatinine, Ser: 0.83 mg/dL (ref 0.44–1.00)
GFR, Estimated: >60 mL/min (ref >60)
Glucose, Bld: 88 mg/dL (ref 70–99)
Potassium: 4.3 mmol/L (ref 3.5–5.1)
Sodium: 132 mmol/L — ABNORMAL LOW (ref 135–145)
Total Bilirubin: 0.4 mg/dL (ref 0.3–1.2)
Total Protein: 6.2 g/dL — ABNORMAL LOW (ref 6.5–8.1)

## 2022-01-26 LAB — PROCALCITONIN: Procalcitonin: <0.1 ng/mL

## 2022-01-26 LAB — CBC
HCT: 42 % (ref 36.0–46.0)
Hemoglobin: 12.8 g/dL (ref 12.0–15.0)
MCH: 28.1 pg (ref 26.0–34.0)
MCHC: 30.5 g/dL (ref 30.0–36.0)
MCV: 92.1 fL (ref 80.0–100.0)
Platelets: 295 10*3/uL (ref 150–400)
RBC: 4.56 MIL/uL (ref 3.87–5.11)
RDW: 12.8 % (ref 11.5–15.5)
WBC: 7 10*3/uL (ref 4.0–10.5)
nRBC: 0 % (ref 0.0–0.2)

## 2022-01-26 LAB — MAGNESIUM: Magnesium: 2 mg/dL (ref 1.7–2.4)

## 2022-01-26 MED ORDER — DILTIAZEM HCL ER COATED BEADS 120 MG PO CP24
120.0000 mg | ORAL_CAPSULE | Freq: Every day | ORAL | Status: DC
  Administered 2022-01-26 – 2022-01-31 (×6): 120 mg via ORAL
  Filled 2022-01-26 (×6): qty 1

## 2022-01-26 MED ORDER — LACTATED RINGERS IV SOLN: INTRAVENOUS | Status: DC

## 2022-01-26 MED ORDER — AMIODARONE HCL 200 MG PO TABS
200.0000 mg | ORAL_TABLET | Freq: Every day | ORAL | Status: DC
  Administered 2022-01-26 – 2022-01-31 (×6): 200 mg via ORAL
  Filled 2022-01-26 (×6): qty 1

## 2022-01-26 MED ORDER — LEVOTHYROXINE SODIUM 112 MCG PO TABS
112.0000 ug | ORAL_TABLET | Freq: Every day | ORAL | Status: DC
  Administered 2022-01-26 – 2022-01-31 (×6): 112 ug via ORAL
  Filled 2022-01-26 (×6): qty 1

## 2022-01-26 MED ORDER — DIGOXIN 0.0625 MG HALF TABLET
0.0625 mg | ORAL_TABLET | Freq: Every day | ORAL | Status: DC
  Administered 2022-01-26 – 2022-01-31 (×6): 0.0625 mg via ORAL
  Filled 2022-01-26 (×6): qty 1

## 2022-01-26 MED ORDER — TRAZODONE HCL 50 MG PO TABS
75.0000 mg | ORAL_TABLET | Freq: Every day | ORAL | Status: DC
Start: 2022-01-26 — End: 2022-01-31
  Administered 2022-01-26 – 2022-01-30 (×5): 75 mg via ORAL
  Filled 2022-01-26 (×7): qty 2

## 2022-01-26 NOTE — Progress Notes (Signed)
The patient is requesting Trazadone for sleep. Message Junious Silk.

## 2022-01-26 NOTE — Progress Notes (Signed)
PT Cancellation Note  Patient Details Name: WINNELL BENTO MRN: [id number] DOB: 26-Sep-1945   Cancelled Treatment:    Reason Eval/Treat Not Completed: Patient declined, no reason specified    Faye Ramsay, PT Acute Rehabilitation  Office: [phone] Pager: [phone]

## 2022-01-26 NOTE — Plan of Care (Signed)
  Problem: Education: Goal: Knowledge of General Education information will improve Description Including pain rating scale, medication(s)/side effects and non-pharmacologic comfort measures Outcome: Progressing   

## 2022-01-26 NOTE — Progress Notes (Addendum)
PROGRESS NOTE    Jacqueline Kim  PJK:[id number] DOB: 07-18-1945 DOA: 01/25/2022 PCP: Georgianne Fick, MD   Brief Narrative:  76 y.o. female with medical history significant of paroxysmal A-fib not on anticoagulation, hypertension, sick sinus syndrome status post permanent pacemaker, hypothyroidism, depression presented with generalized weakness and worsening shortness of breath.  On presentation, UA suggestive of UTI.  Chest x-ray showed no acute abnormality.  Sodium 130.  Upon ambulation, oxygen saturations dropped to 78% on room air.  She was started on IV fluids and antibiotics.  Assessment & Plan:   UTI: Present on admission -Continue Levaquin.  Follow urine cultures.   Hypoxia ?  Acute bacterial/viral bronchitis -Chest x-ray negative for pneumonia.  Procalcitonin pending.  Currently on 2 L oxygen via nasal cannula.  Wean off as able -Nebs as needed.   Physical deconditioning/generalized weakness -Probably from above.  PT eval   ?  Alzheimer's dementia -Patient is unaware of having dementia.  She is slightly confused to time. -Delirium precautions.  Fall precautions.  PT eval.   Hypertension -Monitor blood pressure.  Resume home regimen    Paroxysmal A-fib not on anticoagulation -Currently rate controlled -Not on anticoagulation because of high risk of falls.  Outpatient follow-up with cardiology.    Hypothyroidism -Continue home regimen.   Anemia of chronic disease -Possibly from chronic illnesses.  Hemoglobin stable.   Thrombocytosis -Possibly reactive.  Resolved  Hyponatremia -Mild.  Monitor.  Encourage oral hydration   Sick sinus syndrome status post permanent pacemaker placement -Outpatient follow-up with cardiology   Depression--resume home regimen once verified     DVT prophylaxis: Lovenox Code Status: Full Family Communication: spoke to daughter/Kelly on phone Disposition Plan: Status is: Observation The patient will require care spanning > 2  midnights and should be moved to inpatient because: Of need for IV antibiotics.  PT eval pending.  Consultants: None  Procedures: None  Antimicrobials: Levaquin from 01/25/2022 onwards   Subjective: Patient seen and examined at bedside.  Does not feel well.  Did not get sleep and still coughing.  No overnight fever or vomiting reported.  Objective: Vitals:   01/26/22 0405 01/26/22 0442 01/26/22 0817 01/26/22 0950  BP: (!) 158/75   (!) 143/61  Pulse: 67   65  Resp: 18     Temp: 97.8 F (36.6 C)     TempSrc: Oral     SpO2: 95%  95%   Weight:  58.3 kg    Height:        Intake/Output Summary (Last 24 hours) at 01/26/2022 1055 Last data filed at 01/26/2022 1000 Gross per 24 hour  Intake 1974.81 ml  Output --  Net 1974.81 ml   Filed Weights   01/25/22 1323 01/26/22 0442  Weight: 53.5 kg 58.3 kg    Examination:  General exam: Appears calm and comfortable.  Elderly female lying in bed.  Currently on 2 L oxygen via nasal cannula. Respiratory system: Bilateral decreased breath sounds at bases with some scattered crackles Cardiovascular system: S1 & S2 heard, Rate controlled Gastrointestinal system: Abdomen is nondistended, soft and nontender. Normal bowel sounds heard. Extremities: No cyanosis, clubbing, edema  Central nervous system: Awake, still slightly confused to time.  No focal neurological deficits. Moving extremities Skin: No rashes, lesions or ulcers Psychiatry: Flat affect.  No signs of agitation.    Data Reviewed: I have personally reviewed following labs and imaging studies  CBC: Recent Labs  Lab 01/25/22 1454 01/26/22 0553  WBC 8.3 7.0  NEUTROABS 6.4  --  HGB 11.2* 12.8  HCT 34.6* 42.0  MCV 87.6 92.1  PLT 413* 295   Basic Metabolic Panel: Recent Labs  Lab 01/25/22 1454 01/26/22 0553  NA 130* 132*  K 4.5 4.3  CL 99 102  CO2 23 22  GLUCOSE 102* 88  BUN 18 13  CREATININE 1.07* 0.83  CALCIUM 9.1 8.6*  MG  --  2.0   GFR: Estimated  Creatinine Clearance: 46.2 mL/min (by C-G formula based on SCr of 0.83 mg/dL). Liver Function Tests: Recent Labs  Lab 01/25/22 1454 01/26/22 0553  AST 21 18  ALT 10 9  ALKPHOS 56 45  BILITOT 0.5 0.4  PROT 7.6 6.2*  ALBUMIN 3.3* 2.7*   No results for input(s): "LIPASE", "AMYLASE" in the last 168 hours. No results for input(s): "AMMONIA" in the last 168 hours. Coagulation Profile: Recent Labs  Lab 01/25/22 1455  INR 1.0   Cardiac Enzymes: No results for input(s): "CKTOTAL", "CKMB", "CKMBINDEX", "TROPONINI" in the last 168 hours. BNP (last 3 results) No results for input(s): "PROBNP" in the last 8760 hours. HbA1C: No results for input(s): "HGBA1C" in the last 72 hours. CBG: No results for input(s): "GLUCAP" in the last 168 hours. Lipid Profile: No results for input(s): "CHOL", "HDL", "LDLCALC", "TRIG", "CHOLHDL", "LDLDIRECT" in the last 72 hours. Thyroid Function Tests: No results for input(s): "TSH", "T4TOTAL", "FREET4", "T3FREE", "THYROIDAB" in the last 72 hours. Anemia Panel: No results for input(s): "VITAMINB12", "FOLATE", "FERRITIN", "TIBC", "IRON", "RETICCTPCT" in the last 72 hours. Sepsis Labs: Recent Labs  Lab 01/25/22 1403  LATICACIDVEN 1.0    Recent Results (from the past 240 hour(s))  SARS Coronavirus 2 by RT PCR (hospital order, performed in [facility] hospital lab) *cepheid single result test* Anterior Nasal Swab     Status: None   Collection Time: 01/25/22  1:48 PM   Specimen: Anterior Nasal Swab  Result Value Ref Range Status   SARS Coronavirus 2 by RT PCR NEGATIVE NEGATIVE Final    Comment: (NOTE) SARS-CoV-2 target nucleic acids are NOT DETECTED.  The SARS-CoV-2 RNA is generally detectable in upper and lower respiratory specimens during the acute phase of infection. The lowest concentration of SARS-CoV-2 viral copies this assay can detect is 250 copies / mL. A negative result does not preclude SARS-CoV-2 infection and should not be used as the sole  basis for treatment or other patient management decisions.  A negative result may occur with improper specimen collection / handling, submission of specimen other than nasopharyngeal swab, presence of viral mutation(s) within the areas targeted by this assay, and inadequate number of viral copies (<250 copies / mL). A negative result must be combined with clinical observations, patient history, and epidemiological information.  Fact Sheet for Patients:   RoadLapTop.co.za  Fact Sheet for Healthcare Providers: http://kim-miller.com/  This test is not yet approved or  cleared by the [place] FDA and has been authorized for detection and/or diagnosis of SARS-CoV-2 by FDA under an Emergency Use Authorization (EUA).  This EUA will remain in effect (meaning this test can be used) for the duration of the COVID-19 declaration under Section 564(b)(1) of the Act, 21 U.S.C. section 360bbb-3(b)(1), unless the authorization is terminated or revoked sooner.  Performed at [facility], [address] [place], [place] [address]   Blood culture (routine single)     Status: None (Preliminary result)   Collection Time: 01/25/22  2:50 PM   Specimen: BLOOD  Result Value Ref Range Status   Specimen Description  Final    BLOOD RIGHT ANTECUBITAL Performed at [facility], [address] [place], [place] [address]    Special Requests   Final    BOTTLES DRAWN AEROBIC AND ANAEROBIC Blood Culture adequate volume Performed at [facility], [address] [place], [place] [address]    Culture   Final    NO GROWTH < 24 HOURS Performed at [facility], [address] [place], [place] [address]    Report Status PENDING  Incomplete  Culture, blood (single)     Status: None (Preliminary result)   Collection Time: 01/25/22  4:29 PM   Specimen: BLOOD  Result Value Ref Range Status   Specimen  Description   Final    BLOOD RIGHT ANTECUBITAL Performed at [facility], [address], [place], [place] [address]    Special Requests   Final    BOTTLES DRAWN AEROBIC AND ANAEROBIC Blood Culture results may not be optimal due to an inadequate volume of blood received in culture bottles Performed at [facility], [address], [place], [place] [address]    Culture   Final    NO GROWTH < 24 HOURS Performed at [facility], [address] [place], [place] [address]    Report Status PENDING  Incomplete         Radiology Studies: DG Chest 2 View  Result Date: 01/25/2022 CLINICAL DATA:  Shortness of breath. EXAM: CHEST - 2 VIEW COMPARISON:  Chest x-ray 08/04/2020 FINDINGS: Left-sided pacemaker is unchanged. External artifact overlies the chest. The heart size and mediastinal contours are within normal limits. Both lungs are clear. The visualized skeletal structures are unremarkable. IMPRESSION: No active cardiopulmonary disease. Electronically Signed   By: Darliss Cheney M.D.   On: 01/25/2022 15:43        Scheduled Meds:  amiodarone  200 mg Oral Daily   digoxin  0.0625 mg Oral Daily   diltiazem  120 mg Oral Daily   enoxaparin (LOVENOX) injection  40 mg Subcutaneous QHS   ipratropium-albuterol  3 mL Nebulization TID   levothyroxine  112 mcg Oral Q0600   traZODone  75 mg Oral QHS   Continuous Infusions:  lactated ringers 100 mL/hr at 01/26/22 1010   [START ON 01/27/2022] levofloxacin (LEVAQUIN) IV            Glade Lloyd, MD [facility] 01/26/2022, 10:55 AM

## 2022-01-26 NOTE — Evaluation (Signed)
Clinical/Bedside Swallow Evaluation Patient Details  Name: Jacqueline Kim MRN: [id number] Date of Birth: July 04, 1945  Today's Date: 01/26/2022 Time: SLP Start Time (ACUTE ONLY): 1708 SLP Stop Time (ACUTE ONLY): 1720 SLP Time Calculation (min) (ACUTE ONLY): 12 min  Past Medical History:  Past Medical History:  Diagnosis Date   Anticoagulant long-term use  WITH PRADAXA 05/27/2011   Anxiety    Cellulitis of buttock, left 12/20/2014   Complication of anesthesia    DIFFICULTY WAKING UP   Depression    Diverticulosis    DJD (degenerative joint disease)    Esophagitis 09/17/06   External hemorrhoids    GERD (gastroesophageal reflux disease)    Glaucoma    History of stress test    a. 2008 - normal nuc.   Hypertension    Hypothyroid    IBS (irritable bowel syndrome)    Paroxysmal A-fib (HCC) diagnosed 2008   a. On propafenone, Pradaxa.   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   s/p MDT PPM by Dr Rubie Maid   Past Surgical History:  Past Surgical History:  Procedure Laterality Date   BREAST REDUCTION SURGERY     BUNIONECTOMY Right 07/09/2015   with rods and pins   CARDIOVERSION N/A 08/02/2014   Procedure: CARDIOVERSION;  Surgeon: Quintella Reichert, MD;  Location: [facility] ENDOSCOPY;  Service: Cardiovascular;  Laterality: N/A;   CATARACT EXTRACTION, BILATERAL Bilateral    FACIAL COSMETIC SURGERY     FOOT SURGERY Left    HAND SURGERY     multiple   LIPOSUCTION     NM MYOCAR PERF WALL MOTION  12/29/2006   No significant ischemia; EF 69%   OVARIAN CYST REMOVAL     PACEMAKER INSERTION  04/08/11   MDT Revo implanted by Dr Rubie Maid   PPM GENERATOR CHANGEOUT N/A 10/07/2021   Procedure: PPM GENERATOR CHANGEOUT;  Surgeon: Thurmon Fair, MD;  Location: [facility] INVASIVE CV LAB;  Service: Cardiovascular;  Laterality: N/A;   TONSILLECTOMY     TOTAL KNEE ARTHROPLASTY Left 05/01/2015   Procedure: TOTAL LEFT KNEE ARTHROPLASTY;  Surgeon:  Durene Romans, MD;  Location: [facility] ORS;  Service: Orthopedics;  Laterality: Left;   VAGINAL HYSTERECTOMY     HPI:  Patient is a 76 y.o. female with PMH: paroxysmal a-fib not on anticoagulation, HTN, sick sinus syndrome s/p permanent pacemaker, hypothyroidism, depression. She presented to the hospital on 01/25/22 with generalized weakness and worsening SOB. She has been having URI-like symptoms for past five days along with wet sounding cough but mostly non-productive and without any fevers or chills. She went to urgent care and oxygen saturations were in low 90%'s and dropped to 80%'s on ambulation. CXR showed ?multifocal PNA. She was sent to the ED. In ED, UA was suggestive of a UTI, CXR did not show any acute abnormality. Upon ambulation, her oxygen saturations dropped to 78% on RA. Patient noted to have some confusion and memory deficit.    Assessment / Plan / Recommendation  Clinical Impression  Patient is not currently presenting with clinical s/s of dysphagia as per this bedside/clinical swallow evaluation. She does have h/o GERD but denies needing medications for it. She denies any past or current dysphagia. The only swallowing related change she has noticed is recently she has to take another drink of water when swallowing her pills. SLP provided patient with some suggestions such as drinking water prior to taking pills, taking pills with a thicker liquid or pudding/puree or  trying some gelatin prior to and/or with pills to help with transit. Although patient did have a couple instances of delayed dry, non-productive coughing after straw sips of thin liquids (water), SLP is not highly suspicious that she is penetrating or aspirating. No further skilled intervention needed(in regards to her swallow function) at this time but please reorder SLP if any changes or concerns. Thank you for this consult! SLP Visit Diagnosis: Dysphagia, unspecified (R13.10)    Aspiration Risk  No limitations    Diet  Recommendation Regular;Thin liquid   Liquid Administration via: Cup;Straw Medication Administration: Whole meds with liquid Supervision: Patient able to self feed Postural Changes: Seated upright at 90 degrees;Remain upright for at least 30 minutes after po intake    Other  Recommendations Oral Care Recommendations: Oral care BID;Patient independent with oral care    Recommendations for follow up therapy are one component of a multi-disciplinary discharge planning process, led by the attending physician.  Recommendations may be updated based on patient status, additional functional criteria and insurance authorization.  Follow up Recommendations No SLP follow up      Assistance Recommended at Discharge None  Functional Status Assessment Patient has not had a recent decline in their functional status  Frequency and Duration            Prognosis        Swallow Study   General Date of Onset: 01/25/22 HPI: Patient is a 76 y.o. female with PMH: paroxysmal a-fib not on anticoagulation, HTN, sick sinus syndrome s/p permanent pacemaker, hypothyroidism, depression. She presented to the hospital on 01/25/22 with generalized weakness and worsening SOB. She has been having URI-like symptoms for past five days along with wet sounding cough but mostly non-productive and without any fevers or chills. She went to urgent care and oxygen saturations were in low 90%'s and dropped to 80%'s on ambulation. CXR showed ?multifocal PNA. She was sent to the ED. In ED, UA was suggestive of a UTI, CXR did not show any acute abnormality. Upon ambulation, her oxygen saturations dropped to 78% on RA. Patient noted to have some confusion and memory deficit. Type of Study: Bedside Swallow Evaluation Previous Swallow Assessment: none found Diet Prior to this Study: Regular;Thin liquids Temperature Spikes Noted: No Respiratory Status: Room air History of Recent Intubation: No Behavior/Cognition: Pleasant  mood;Alert;Cooperative Oral Cavity Assessment: Within Functional Limits Oral Care Completed by SLP: No Oral Cavity - Dentition: Adequate natural dentition Vision: Functional for self-feeding Self-Feeding Abilities: Able to feed self Patient Positioning: Upright in bed Baseline Vocal Quality: Normal Volitional Cough: Strong Volitional Swallow: Able to elicit    Oral/Motor/Sensory Function Overall Oral Motor/Sensory Function: Within functional limits   Ice Chips     Thin Liquid Thin Liquid: Within functional limits Presentation: Straw;Self Fed Other Comments: some delayed dry non-productive coughing noted but SLP not highly suspicious of penetration/aspiration    Nectar Thick     Honey Thick     Puree Puree: Not tested   Solid     Solid: Not tested      Angela Nevin, MA, CCC-SLP Speech Therapy

## 2022-01-27 DIAGNOSIS — R0902 Hypoxemia: Secondary | ICD-10-CM | POA: Diagnosis not present

## 2022-01-27 DIAGNOSIS — E871 Hypo-osmolality and hyponatremia: Secondary | ICD-10-CM | POA: Diagnosis not present

## 2022-01-27 DIAGNOSIS — N390 Urinary tract infection, site not specified: Secondary | ICD-10-CM | POA: Diagnosis not present

## 2022-01-27 DIAGNOSIS — D649 Anemia, unspecified: Secondary | ICD-10-CM | POA: Diagnosis not present

## 2022-01-27 LAB — BASIC METABOLIC PANEL
Anion gap: 9 (ref 5–15)
BUN: 16 mg/dL (ref 8–23)
CO2: 23 mmol/L (ref 22–32)
Calcium: 8.1 mg/dL — ABNORMAL LOW (ref 8.9–10.3)
Chloride: 98 mmol/L (ref 98–111)
Creatinine, Ser: 0.89 mg/dL (ref 0.44–1.00)
GFR, Estimated: >60 mL/min (ref >60)
Glucose, Bld: 103 mg/dL — ABNORMAL HIGH (ref 70–99)
Potassium: 4.1 mmol/L (ref 3.5–5.1)
Sodium: 130 mmol/L — ABNORMAL LOW (ref 135–145)

## 2022-01-27 LAB — PROCALCITONIN: Procalcitonin: <0.1 ng/mL

## 2022-01-27 LAB — MAGNESIUM: Magnesium: 1.9 mg/dL (ref 1.7–2.4)

## 2022-01-27 MED ORDER — METHYLPREDNISOLONE SODIUM SUCC 40 MG IJ SOLR
40.0000 mg | Freq: Every day | INTRAMUSCULAR | Status: DC
  Administered 2022-01-27: 40 mg via INTRAVENOUS
  Filled 2022-01-27: qty 1

## 2022-01-27 NOTE — Evaluation (Signed)
Physical Therapy Evaluation Patient Details Name: Jacqueline Kim MRN: [id number] DOB: 1946/03/25 Today's Date: 01/27/2022  History of Present Illness  76 y.o. female with medical history significant of paroxysmal A-fib not on anticoagulation, hypertension, sick sinus syndrome status post permanent pacemaker, hypothyroidism, depression, dementia.  presented with generalized weakness and worsening shortness of breath,  UA suggestive of UTI.  Chest x-ray showed no acute abnormality  Clinical Impression  Pt admitted with above diagnosis.  Pt reports she has been amb to bathroom. She does not feel up to amb greater distance at this time however is agreeable to bed mobility and standing. SpO2=86% at rest, 84% with activity. Able to maintain SpO2= 93% on 3L. Pt is min/guard overall, reports independence at baseline. Recommend HHPT post acute   Pt currently with functional limitations due to the deficits listed below (see PT Problem List). Pt will benefit from skilled PT to increase their independence and safety with mobility to allow discharge to the venue listed below.          Recommendations for follow up therapy are one component of a multi-disciplinary discharge planning process, led by the attending physician.  Recommendations may be updated based on patient status, additional functional criteria and insurance authorization.  Follow Up Recommendations Home health PT      Assistance Recommended at Discharge Intermittent Supervision/Assistance  Patient can return home with the following  Help with stairs or ramp for entrance;Assistance with cooking/housework;Assist for transportation    Equipment Recommendations Other (comment) (TBA)  Recommendations for Other Services       Functional Status Assessment Patient has had a recent decline in their functional status and demonstrates the ability to make significant improvements in function in a reasonable and predictable amount of time.      Precautions / Restrictions Precautions Precautions: Fall Precaution Comments: monitor O2 Restrictions Weight Bearing Restrictions: No      Mobility  Bed Mobility Overal bed mobility: Needs Assistance Bed Mobility: Supine to Sit, Sit to Supine     Supine to sit: Supervision Sit to supine: Supervision   General bed mobility comments: no physical assist, use of bed rail    Transfers Overall transfer level: Needs assistance Equipment used: Rolling walker (2 wheels) Transfers: Sit to/from Stand Sit to Stand: Min guard           General transfer comment: for safety    Ambulation/Gait               General Gait Details: lateral steps along EOB only with min/guard assist. pt reports she is just not up to amb today (has been amb to the bathroom)  Stairs            Wheelchair Mobility    Modified Rankin (Stroke Patients Only)       Balance   Sitting-balance support: Feet supported, No upper extremity supported Sitting balance-Leahy Scale: Good       Standing balance-Leahy Scale: Fair                               Pertinent Vitals/Pain Pain Assessment Pain Assessment: No/denies pain    Home Living Family/patient expects to be discharged to:: Private residence Living Arrangements: Alone Available Help at Discharge: Family;Available PRN/intermittently Type of Home: Other(Comment) (townhome) Home Access: Stairs to enter   Entrance Stairs-Number of Steps: 1 + 2   Home Layout: One level Home Equipment: Rollator (4 wheels);BSC/3in1 Additional Comments: dtrs check  on pt frequently    Prior Function Prior Level of Function : Independent/Modified Independent             Mobility Comments: dtrs drive her to appointments       Hand Dominance        Extremity/Trunk Assessment   Upper Extremity Assessment Upper Extremity Assessment: Defer to OT evaluation;Generalized weakness    Lower Extremity Assessment Lower Extremity  Assessment: Generalized weakness       Communication   Communication: No difficulties  Cognition Arousal/Alertness: Awake/alert Behavior During Therapy: WFL for tasks assessed/performed Overall Cognitive Status: Within Functional Limits for tasks assessed                                          General Comments      Exercises     Assessment/Plan    PT Assessment Patient needs continued PT services  PT Problem List Decreased strength;Decreased mobility;Decreased activity tolerance;Cardiopulmonary status limiting activity;Decreased knowledge of use of DME       PT Treatment Interventions DME instruction;Therapeutic exercise;Gait training;Functional mobility training;Therapeutic activities;Patient/family education    PT Goals (Current goals can be found in the Care Plan section)  Acute Rehab PT Goals Patient Stated Goal: to feel better PT Goal Formulation: With patient Time For Goal Achievement: 02/10/22 Potential to Achieve Goals: Good    Frequency Min 3X/week     Co-evaluation               AM-PAC PT "6 Clicks" Mobility  Outcome Measure Help needed turning from your back to your side while in a flat bed without using bedrails?: A Little Help needed moving from lying on your back to sitting on the side of a flat bed without using bedrails?: A Little Help needed moving to and from a bed to a chair (including a wheelchair)?: A Little Help needed standing up from a chair using your arms (e.g., wheelchair or bedside chair)?: A Little Help needed to walk in hospital room?: A Lot Help needed climbing 3-5 steps with a railing? : A Lot 6 Click Score: 16    End of Session Equipment Utilized During Treatment: Oxygen Activity Tolerance: Patient limited by fatigue Patient left: in bed;with call bell/phone within reach;with bed alarm set;with nursing/sitter in room   PT Visit Diagnosis: Unsteadiness on feet (R26.81);Muscle weakness (generalized)  (M62.81)    Time: 8546-2703 PT Time Calculation (min) (ACUTE ONLY): 12 min   Charges:   PT Evaluation $PT Eval Low Complexity: 1 Low          Tabbitha Janvrin, PT  Acute Rehab Dept [facility]) [phone]  [facility] Weekend Pager [facility] only)  [phone]  01/27/2022   [facility] 01/27/2022, 1:18 PM

## 2022-01-27 NOTE — Progress Notes (Addendum)
PROGRESS NOTE    Jacqueline Kim  ION:[id number] DOB: 12-19-1945 DOA: 01/25/2022 PCP: Georgianne Fick, MD   Brief Narrative:  76 y.o. female with medical history significant of paroxysmal A-fib not on anticoagulation, hypertension, sick sinus syndrome status post permanent pacemaker, hypothyroidism, depression presented with generalized weakness and worsening shortness of breath.  On presentation, UA suggestive of UTI.  Chest x-ray showed no acute abnormality.  Sodium 130.  Upon ambulation, oxygen saturations dropped to 78% on room air.  She was started on IV fluids and antibiotics.  Assessment & Plan:   UTI: Present on admission -Continue Levaquin.  Urine culture growing gram-negative rods.  Follow sensitivities and identification.  Hypoxia ?  Acute bacterial/viral bronchitis -Chest x-ray negative for pneumonia.  Procalcitonin less than 0.1.  - Currently on 2 L oxygen via nasal cannula.  Wean off as able.  Will start Solu-Medrol 40 mg IV daily. -Nebs as needed.   Physical deconditioning/generalized weakness -Probably from above.  PT eval pending.   Dementia -She is slightly confused to time. -Daughter confirmed that patient has diagnosis of dementia and is being followed by neurology -Delirium precautions.  Fall precautions.  PT eval.   Hypertension -Monitor blood pressure.  Continue Cardizem  Paroxysmal A-fib not on anticoagulation -Currently rate controlled.  Continue Cardizem and amiodarone and digoxin -Not on anticoagulation because of high risk of falls.  Outpatient follow-up with cardiology.    Hypothyroidism -Continue home regimen.   Anemia of chronic disease -Possibly from chronic illnesses.  Hemoglobin stable.   Thrombocytosis -Possibly reactive.  Resolved  Hyponatremia -Mild.  Monitor.  Encourage oral hydration   Sick sinus syndrome status post permanent pacemaker placement -Outpatient follow-up with cardiology   DVT prophylaxis: Lovenox Code Status:  Full Family Communication: spoke to daughter/Kelly on phone on 01/27/2022 Disposition Plan: Status is:  inpatient because: Of need for IV antibiotics.  PT eval pending.  Consultants: None  Procedures: None  Antimicrobials: Levaquin from 01/25/2022 onwards   Subjective: Patient seen and examined at bedside.  No fever, seizures, vomiting reported.  Slightly confused to time.  Poor historian.  Asking if she can go home today. Objective: Vitals:   01/26/22 1317 01/26/22 1951 01/26/22 2046 01/27/22 0344  BP:   (!) 106/51 (!) 124/55  Pulse:   60 62  Resp:   19 19  Temp:   97.8 F (36.6 C) 98 F (36.7 C)  TempSrc:      SpO2: 93% 95% 90% 92%  Weight:      Height:        Intake/Output Summary (Last 24 hours) at 01/27/2022 0802 Last data filed at 01/26/2022 2200 Gross per 24 hour  Intake 1814.89 ml  Output 100 ml  Net 1714.89 ml    Filed Weights   01/25/22 1323 01/26/22 0442  Weight: 53.5 kg 58.3 kg    Examination:  General: On room air.  No distress.  Elderly female lying in bed.  Awake, slightly confused to time. respiratory: Decreased breath sounds at bases bilaterally with some crackles CVS: Currently rate controlled; S1-S2 heard  abdominal: Soft, nontender, slightly distended, no organomegaly; bowel sounds are heard  extremities: Trace lower extremity edema; no clubbing.      Data Reviewed: I have personally reviewed following labs and imaging studies  CBC: Recent Labs  Lab 01/25/22 1454 01/26/22 0553  WBC 8.3 7.0  NEUTROABS 6.4  --   HGB 11.2* 12.8  HCT 34.6* 42.0  MCV 87.6 92.1  PLT 413* 295  Basic Metabolic Panel: Recent Labs  Lab 01/25/22 1454 01/26/22 0553 01/27/22 0504  NA 130* 132* 130*  K 4.5 4.3 4.1  CL 99 102 98  CO2 23 22 23   GLUCOSE 102* 88 103*  BUN 18 13 16   CREATININE 1.07* 0.83 0.89  CALCIUM 9.1 8.6* 8.1*  MG  --  2.0 1.9    GFR: Estimated Creatinine Clearance: 43.1 mL/min (by C-G formula based on SCr of 0.89 mg/dL). Liver  Function Tests: Recent Labs  Lab 01/25/22 1454 01/26/22 0553  AST 21 18  ALT 10 9  ALKPHOS 56 45  BILITOT 0.5 0.4  PROT 7.6 6.2*  ALBUMIN 3.3* 2.7*    No results for input(s): "LIPASE", "AMYLASE" in the last 168 hours. No results for input(s): "AMMONIA" in the last 168 hours. Coagulation Profile: Recent Labs  Lab 01/25/22 1455  INR 1.0    Cardiac Enzymes: No results for input(s): "CKTOTAL", "CKMB", "CKMBINDEX", "TROPONINI" in the last 168 hours. BNP (last 3 results) No results for input(s): "PROBNP" in the last 8760 hours. HbA1C: No results for input(s): "HGBA1C" in the last 72 hours. CBG: No results for input(s): "GLUCAP" in the last 168 hours. Lipid Profile: No results for input(s): "CHOL", "HDL", "LDLCALC", "TRIG", "CHOLHDL", "LDLDIRECT" in the last 72 hours. Thyroid Function Tests: No results for input(s): "TSH", "T4TOTAL", "FREET4", "T3FREE", "THYROIDAB" in the last 72 hours. Anemia Panel: No results for input(s): "VITAMINB12", "FOLATE", "FERRITIN", "TIBC", "IRON", "RETICCTPCT" in the last 72 hours. Sepsis Labs: Recent Labs  Lab 01/25/22 1403 01/26/22 0826 01/27/22 0504  PROCALCITON  --  <0.10 <0.10  LATICACIDVEN 1.0  --   --      Recent Results (from the past 240 hour(s))  SARS Coronavirus 2 by RT PCR (hospital order, performed in [facility] hospital lab) *cepheid single result test* Anterior Nasal Swab     Status: None   Collection Time: 01/25/22  1:48 PM   Specimen: Anterior Nasal Swab  Result Value Ref Range Status   SARS Coronavirus 2 by RT PCR NEGATIVE NEGATIVE Final    Comment: (NOTE) SARS-CoV-2 target nucleic acids are NOT DETECTED.  The SARS-CoV-2 RNA is generally detectable in upper and lower respiratory specimens during the acute phase of infection. The lowest concentration of SARS-CoV-2 viral copies this assay can detect is 250 copies / mL. A negative result does not preclude SARS-CoV-2 infection and should not be used as the sole basis  for treatment or other patient management decisions.  A negative result may occur with improper specimen collection / handling, submission of specimen other than nasopharyngeal swab, presence of viral mutation(s) within the areas targeted by this assay, and inadequate number of viral copies (<250 copies / mL). A negative result must be combined with clinical observations, patient history, and epidemiological information.  Fact Sheet for Patients:   https://www.patel.info/  Fact Sheet for Healthcare Providers: https://hall.com/  This test is not yet approved or  cleared by the [place] FDA and has been authorized for detection and/or diagnosis of SARS-CoV-2 by FDA under an Emergency Use Authorization (EUA).  This EUA will remain in effect (meaning this test can be used) for the duration of the COVID-19 declaration under Section 564(b)(1) of the Act, 21 U.S.C. section 360bbb-3(b)(1), unless the authorization is terminated or revoked sooner.  Performed at [facility], [place] [address] [place], [place] [address]   Blood culture (routine single)     Status: None (Preliminary result)   Collection Time: 01/25/22  2:50 PM  Specimen: BLOOD  Result Value Ref Range Status   Specimen Description   Final    BLOOD RIGHT ANTECUBITAL Performed at [facility], [address], [place], [place] [address]    Special Requests   Final    BOTTLES DRAWN AEROBIC AND ANAEROBIC Blood Culture adequate volume Performed at [facility], [address], [place], [place] [address]    Culture   Final    NO GROWTH 2 DAYS Performed at [facility], [address] [place], [place] [address]    Report Status PENDING  Incomplete  Culture, blood (single)     Status: None (Preliminary result)   Collection Time: 01/25/22  4:29 PM   Specimen: BLOOD  Result Value Ref Range Status   Specimen Description    Final    BLOOD RIGHT ANTECUBITAL Performed at [facility], [address], [place], [place] [address]    Special Requests   Final    BOTTLES DRAWN AEROBIC AND ANAEROBIC Blood Culture results may not be optimal due to an inadequate volume of blood received in culture bottles Performed at [facility], [address], [place], [place] [address]    Culture   Final    NO GROWTH 2 DAYS Performed at [facility], [address] [place], [place] [address]    Report Status PENDING  Incomplete  Urine Culture     Status: Abnormal (Preliminary result)   Collection Time: 01/25/22 11:29 PM   Specimen: Urine, Clean Catch  Result Value Ref Range Status   Specimen Description   Final    URINE, CLEAN CATCH Performed at [facility], [address], [place], [place] [address]    Special Requests   Final    NONE Performed at [facility], [address] [place], [place] [address]    Culture >=100,000 COLONIES/mL GRAM NEGATIVE RODS (A)  Final   Report Status PENDING  Incomplete         Radiology Studies: DG Chest 2 View  Result Date: 01/25/2022 CLINICAL DATA:  Shortness of breath. EXAM: CHEST - 2 VIEW COMPARISON:  Chest x-ray 08/04/2020 FINDINGS: Left-sided pacemaker is unchanged. External artifact overlies the chest. The heart size and mediastinal contours are within normal limits. Both lungs are clear. The visualized skeletal structures are unremarkable. IMPRESSION: No active cardiopulmonary disease. Electronically Signed   By: Darliss Cheney M.D.   On: 01/25/2022 15:43        Scheduled Meds:  amiodarone  200 mg Oral Daily   digoxin  0.0625 mg Oral Daily   diltiazem  120 mg Oral Daily   enoxaparin (LOVENOX) injection  40 mg Subcutaneous QHS   ipratropium-albuterol  3 mL Nebulization TID   levothyroxine  112 mcg Oral Q0600   traZODone  75 mg Oral QHS   Continuous Infusions:  levofloxacin (LEVAQUIN)  IV            Glade Lloyd, MD [facility] 01/27/2022, 8:02 AM

## 2022-01-27 NOTE — TOC Initial Note (Signed)
Transition of Care (TOC) - Initial/Assessment Note    Patient Details  Name: Jacqueline Kim MRN: [id number] Date of Birth: 07/25/1945  Transition of Care (TOC) CM/SW Contact:     A , LCSW Phone Number: 01/27/2022, 2:52 PM  Clinical Narrative:                 Met with pt and daughter at bedside and confirmed plan for pt to return home with HHPT. Pt/daughter is unsure if she has had home health services in the past and do not have a preference for agency used. Pt will receive HHPT through [facility]. Pt has DME rolling walker, BSC, and shower chair at  home. TOC will continue to follow for possible home O2 need.    Expected Discharge Plan: Home w Home Health Services Barriers to Discharge: No Barriers Identified   Patient Goals and CMS Choice Patient states their goals for this hospitalization and ongoing recovery are:: To return home   Choice offered to / list presented to : Patient, Adult Children  Expected Discharge Plan and Services Expected Discharge Plan: Home w Home Health Services In-house Referral: Clinical Social Work Discharge Planning Services: CM Consult Post Acute Care Choice: Home Health Living arrangements for the past 2 months: Apartment                           HH Arranged: PT HH Agency: [facility] Date HH Agency Contacted: 01/27/22 Time HH Agency Contacted: 1450 Representative spoke with at HH Agency: Corey  Prior Living Arrangements/Services Living arrangements for the past 2 months: Apartment Lives with:: Self Patient language and need for interpreter reviewed:: Yes Do you feel safe going back to the place where you live?: Yes      Need for Family Participation in Patient Care: Yes (Comment) Care giver support system in place?: No (comment) Current home services: DME Criminal Activity/Legal Involvement Pertinent to Current Situation/Hospitalization: No - Comment as needed  Activities of Daily Living Home Assistive  Devices/Equipment: None ADL Screening (condition at time of admission) Patient's cognitive ability adequate to safely complete daily activities?: Yes Is the patient deaf or have difficulty hearing?: No Does the patient have difficulty seeing, even when wearing glasses/contacts?: Yes Does the patient have difficulty concentrating, remembering, or making decisions?: No Patient able to express need for assistance with ADLs?: Yes Does the patient have difficulty dressing or bathing?: No Independently performs ADLs?: Yes (appropriate for developmental age) Does the patient have difficulty walking or climbing stairs?: Yes Weakness of Legs: None Weakness of Arms/Hands: None  Permission Sought/Granted Permission sought to share information with : Family Supports, Facility Contact Representative Permission granted to share information with : Yes, Verbal Permission Granted  Share Information with NAME: Kelly Trexler     Permission granted to share info w Relationship: Daughter  Permission granted to share info w Contact Information: [phone]  Emotional Assessment Appearance:: Appears stated age Attitude/Demeanor/Rapport: Engaged, Charismatic Affect (typically observed): Accepting, Pleasant Orientation: : Oriented to Self, Oriented to Place, Oriented to  Time, Oriented to Situation Alcohol / Substance Use: Not Applicable Psych Involvement: No (comment)  Admission diagnosis:  UTI (urinary tract infection) [N39.0] Acute hypoxemic respiratory failure (HCC) [J96.01] Patient Active Problem List   Diagnosis Date Noted   Hypoxia 01/25/2022   Thrombocytosis 01/25/2022   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 01/09/2022   Pacemaker battery depletion 10/07/2021   Acute metabolic encephalopathy 08/05/2020     UTI (urinary tract infection) 08/04/2020   Obstructive sleep apnea of adult 06/28/2020   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) 06/28/2020   Advanced nonexudative age-related macular degeneration of left eye with subfoveal involvement 06/28/2020   Hematoma of left thigh 01/21/2019   Normocytic anemia 01/21/2019   Hematoma 01/21/2019   Facial laceration    Rectal bleeding 02/01/2016   Constipation 02/01/2016   Dehydration 05/05/2015   S/P left TKA 05/01/2015   S/P knee replacement 05/01/2015   Hypotension due to drugs 02/20/2015   Cellulitis of left buttock 12/20/2014   Cellulitis 12/20/2014   AKI (acute kidney injury) (HCC) 12/20/2014   Hyponatremia 12/20/2014   Paroxysmal atrial fibrillation (HCC) 08/01/2014   Irritable bowel syndrome 06/14/2014   Fatigue 03/06/2014   HTN (hypertension) 09/28/2013   Atrial fibrillation with rapid ventricular response; paroxysmal 09/18/2013   Chest pain 10/08/2012   D-dimer, elevated, pt. on pradaxa 09/11/2011   Chest pain, negative MI, resolved once heart rate slowed, secondary to rapid a. fib 09/11/2011   Paroxysmal a-fib, recurrent 07/13/12- (on Propafenone)  05/27/2011   Sinus node dysfunction (HCC) 05/27/2011   Hypothyroid 05/27/2011   Pacemaker [employer]  (REVO) placed10/23/12 05/27/2011   Anxiety 05/27/2011   Chronic anticoagulation 05/27/2011   PCP:  Ramachandran, Ajith, MD Pharmacy:   [facility] [address] [place] [address] Phone: [phone] Fax: [phone]     Social Determinants of Health (SDOH) Interventions    Readmission Risk Interventions    01/27/2022    2:49 PM  Readmission Risk Prevention Plan  Post Dischage Appt Complete  Medication Screening Complete  Transportation Screening Complete     

## 2022-01-28 ENCOUNTER — Inpatient Hospital Stay (HOSPITAL_COMMUNITY): Payer: Medicare Other

## 2022-01-28 ENCOUNTER — Encounter (HOSPITAL_COMMUNITY): Payer: Self-pay

## 2022-01-28 DIAGNOSIS — R0902 Hypoxemia: Secondary | ICD-10-CM | POA: Diagnosis not present

## 2022-01-28 DIAGNOSIS — I495 Sick sinus syndrome: Secondary | ICD-10-CM | POA: Diagnosis not present

## 2022-01-28 DIAGNOSIS — I480 Paroxysmal atrial fibrillation: Secondary | ICD-10-CM | POA: Diagnosis not present

## 2022-01-28 DIAGNOSIS — N390 Urinary tract infection, site not specified: Secondary | ICD-10-CM | POA: Diagnosis not present

## 2022-01-28 LAB — BASIC METABOLIC PANEL
Anion gap: 5 (ref 5–15)
BUN: 15 mg/dL (ref 8–23)
CO2: 25 mmol/L (ref 22–32)
Calcium: 8.8 mg/dL — ABNORMAL LOW (ref 8.9–10.3)
Chloride: 103 mmol/L (ref 98–111)
Creatinine, Ser: 0.7 mg/dL (ref 0.44–1.00)
GFR, Estimated: >60 mL/min (ref >60)
Glucose, Bld: 133 mg/dL — ABNORMAL HIGH (ref 70–99)
Potassium: 4.7 mmol/L (ref 3.5–5.1)
Sodium: 133 mmol/L — ABNORMAL LOW (ref 135–145)

## 2022-01-28 LAB — MAGNESIUM: Magnesium: 2.3 mg/dL (ref 1.7–2.4)

## 2022-01-28 LAB — URINE CULTURE: Culture: >=100000 — AB

## 2022-01-28 MED ORDER — MELATONIN 3 MG PO TABS
3.0000 mg | ORAL_TABLET | Freq: Once | ORAL | Status: AC
  Administered 2022-01-28: 3 mg via ORAL
  Filled 2022-01-28: qty 1

## 2022-01-28 MED ORDER — MOMETASONE FURO-FORMOTEROL FUM 200-5 MCG/ACT IN AERO
2.0000 | INHALATION_SPRAY | Freq: Two times a day (BID) | RESPIRATORY_TRACT | Status: DC
  Administered 2022-01-28 – 2022-01-31 (×7): 2 via RESPIRATORY_TRACT
  Filled 2022-01-28: qty 8.8

## 2022-01-28 MED ORDER — METHYLPREDNISOLONE SODIUM SUCC 40 MG IJ SOLR
40.0000 mg | Freq: Two times a day (BID) | INTRAMUSCULAR | Status: DC
  Administered 2022-01-28 – 2022-01-29 (×3): 40 mg via INTRAVENOUS
  Filled 2022-01-28 (×2): qty 1

## 2022-01-28 MED ORDER — SODIUM CHLORIDE (PF) 0.9 % IJ SOLN
INTRAMUSCULAR | Status: AC
  Filled 2022-01-28: qty 50

## 2022-01-28 MED ORDER — IOHEXOL 350 MG/ML SOLN
75.0000 mL | Freq: Once | INTRAVENOUS | Status: AC | PRN
Start: 2022-01-28 — End: 2022-01-28
  Administered 2022-01-28: 75 mL via INTRAVENOUS

## 2022-01-28 MED ORDER — FUROSEMIDE 10 MG/ML IJ SOLN
40.0000 mg | Freq: Once | INTRAMUSCULAR | Status: AC
  Administered 2022-01-28: 40 mg via INTRAVENOUS
  Filled 2022-01-28: qty 4

## 2022-01-28 MED ORDER — IOHEXOL 300 MG/ML  SOLN
100.0000 mL | Freq: Once | INTRAMUSCULAR | Status: DC | PRN
Start: 2022-01-28 — End: 2022-01-28

## 2022-01-28 NOTE — Progress Notes (Addendum)
PROGRESS NOTE    Jacqueline Kim  JEH:[id number] DOB: 04-Sep-1945 DOA: 01/25/2022 PCP: Georgianne Fick, MD   Brief Narrative:  76 y.o. female with medical history significant of paroxysmal A-fib not on anticoagulation, hypertension, sick sinus syndrome status post permanent pacemaker, hypothyroidism, depression presented with generalized weakness and worsening shortness of breath.  On presentation, UA suggestive of UTI.  Chest x-ray showed no acute abnormality.  Sodium 130.  Upon ambulation, oxygen saturations dropped to 78% on room air.  She was started on IV fluids and antibiotics.  Assessment & Plan:   UTI: Present on admission -Continue Levaquin.  Urine culture growing Serratia.  Follow sensitivities and identification.  Acute respiratory failure with hypoxia ?  Acute bacterial/viral bronchitis -Chest x-ray negative for pneumonia.  Procalcitonin less than 0.1.  -Respiratory status has worsened.  Currently on 4 L oxygen via nasal cannula.  Wean off as able.  Started on Solu-Medrol 40 mg IV daily on 01/27/2022.  Increase Solu-Medrol to 40 mg IV every 12 hours.  Check CTA chest.  We will give 1 dose of IV Lasix. -Continue nebs.  Add Dulera   physical deconditioning/generalized weakness -Probably from above.  PT recommends home health PT  Dementia -She is slightly confused to time. -Daughter confirmed that patient has diagnosis of dementia and is being followed by neurology -Delirium precautions.  Fall precautions.     Hypertension -Monitor blood pressure.  Continue Cardizem  Paroxysmal A-fib not on anticoagulation -Currently rate controlled.  Continue Cardizem and amiodarone and digoxin -Not on anticoagulation because of high risk of falls.  Outpatient follow-up with cardiology.    Hypothyroidism -Continue levothyroxine  Anemia of chronic disease -Possibly from chronic illnesses.  Hemoglobin stable..  Monitor intermittently   Thrombocytosis -Possibly reactive.   Resolved  Hyponatremia -Mild.  Improving.  Monitor.  Encourage oral hydration   Sick sinus syndrome status post permanent pacemaker placement -Outpatient follow-up with cardiology   DVT prophylaxis: Lovenox Code Status: Full Family Communication: spoke to daughter/Kelly on phone on 01/27/2022 Disposition Plan: Status is:  inpatient because: Of need for IV antibiotics.  Respiratory status worsening.  Consultants: None  Procedures: None  Antimicrobials: Levaquin from 01/25/2022 onwards   Subjective: Patient seen and examined at bedside.  Poor historian.  Wakes up slightly, still slightly confused to time.  No agitation, vomiting, fever or chest pain reported. Objective: Vitals:   01/27/22 0852 01/27/22 2000 01/27/22 2028 01/28/22 0257  BP:  136/62  132/82  Pulse:  61  73  Resp:  16  18  Temp:  98 F (36.7 C)  98.3 F (36.8 C)  TempSrc:  Oral  Oral  SpO2: 91% 97% 97% 100%  Weight:      Height:        Intake/Output Summary (Last 24 hours) at 01/28/2022 0759 Last data filed at 01/28/2022 0600 Gross per 24 hour  Intake 879.28 ml  Output 950 ml  Net -70.72 ml    Filed Weights   01/25/22 1323 01/26/22 0442  Weight: 53.5 kg 58.3 kg    Examination:  General: On 4 L oxygen via nasal cannula.  No distress.  Elderly female lying in bed.  Looks chronically ill and deconditioned. ENT/neck: No obvious JVD elevation noted.  No palpable neck masses noted.   respiratory: Bilateral decreased breath sounds at bases with scattered crackles CVS: Rate is currently controlled; S1 and S2 are heard  abdominal: Soft, nontender, distended mildly; no organomegaly, bowel sounds heard normally  extremities: No clubbing; mild lower extremity  edema present  CNS: Wakes up slightly, still slightly confused to time.  No focal neurologic deficit.  Moving extremities Lymph: No palpable lymphadenopathy noted  skin: No obvious petechiae/rashes psych: Flat affect.  Not agitated currently     Data  Reviewed: I have personally reviewed following labs and imaging studies  CBC: Recent Labs  Lab 01/25/22 1454 01/26/22 0553  WBC 8.3 7.0  NEUTROABS 6.4  --   HGB 11.2* 12.8  HCT 34.6* 42.0  MCV 87.6 92.1  PLT 413* 295    Basic Metabolic Panel: Recent Labs  Lab 01/25/22 1454 01/26/22 0553 01/27/22 0504 01/28/22 0640  NA 130* 132* 130* 133*  K 4.5 4.3 4.1 4.7  CL 99 102 98 103  CO2 23 22 23 25   GLUCOSE 102* 88 103* 133*  BUN 18 13 16 15   CREATININE 1.07* 0.83 0.89 0.70  CALCIUM 9.1 8.6* 8.1* 8.8*  MG  --  2.0 1.9 2.3    GFR: Estimated Creatinine Clearance: 47.9 mL/min (by C-G formula based on SCr of 0.7 mg/dL). Liver Function Tests: Recent Labs  Lab 01/25/22 1454 01/26/22 0553  AST 21 18  ALT 10 9  ALKPHOS 56 45  BILITOT 0.5 0.4  PROT 7.6 6.2*  ALBUMIN 3.3* 2.7*    No results for input(s): "LIPASE", "AMYLASE" in the last 168 hours. No results for input(s): "AMMONIA" in the last 168 hours. Coagulation Profile: Recent Labs  Lab 01/25/22 1455  INR 1.0    Cardiac Enzymes: No results for input(s): "CKTOTAL", "CKMB", "CKMBINDEX", "TROPONINI" in the last 168 hours. BNP (last 3 results) No results for input(s): "PROBNP" in the last 8760 hours. HbA1C: No results for input(s): "HGBA1C" in the last 72 hours. CBG: No results for input(s): "GLUCAP" in the last 168 hours. Lipid Profile: No results for input(s): "CHOL", "HDL", "LDLCALC", "TRIG", "CHOLHDL", "LDLDIRECT" in the last 72 hours. Thyroid Function Tests: No results for input(s): "TSH", "T4TOTAL", "FREET4", "T3FREE", "THYROIDAB" in the last 72 hours. Anemia Panel: No results for input(s): "VITAMINB12", "FOLATE", "FERRITIN", "TIBC", "IRON", "RETICCTPCT" in the last 72 hours. Sepsis Labs: Recent Labs  Lab 01/25/22 1403 01/26/22 0826 01/27/22 0504  PROCALCITON  --  <0.10 <0.10  LATICACIDVEN 1.0  --   --      Recent Results (from the past 240 hour(s))  SARS Coronavirus 2 by RT PCR (hospital order,  performed in [facility] hospital lab) *cepheid single result test* Anterior Nasal Swab     Status: None   Collection Time: 01/25/22  1:48 PM   Specimen: Anterior Nasal Swab  Result Value Ref Range Status   SARS Coronavirus 2 by RT PCR NEGATIVE NEGATIVE Final    Comment: (NOTE) SARS-CoV-2 target nucleic acids are NOT DETECTED.  The SARS-CoV-2 RNA is generally detectable in upper and lower respiratory specimens during the acute phase of infection. The lowest concentration of SARS-CoV-2 viral copies this assay can detect is 250 copies / mL. A negative result does not preclude SARS-CoV-2 infection and should not be used as the sole basis for treatment or other patient management decisions.  A negative result may occur with improper specimen collection / handling, submission of specimen other than nasopharyngeal swab, presence of viral mutation(s) within the areas targeted by this assay, and inadequate number of viral copies (<250 copies / mL). A negative result must be combined with clinical observations, patient history, and epidemiological information.  Fact Sheet for Patients:   [facility]  Fact Sheet for Healthcare Providers: 03/27/22  This test is not  yet approved or  cleared by the [place] and has been authorized for detection and/or diagnosis of SARS-CoV-2 by FDA under an Emergency Use Authorization (EUA).  This EUA will remain in effect (meaning this test can be used) for the duration of the COVID-19 declaration under Section 564(b)(1) of the Act, 21 U.S.C. section 360bbb-3(b)(1), unless the authorization is terminated or revoked sooner.  Performed at [facility], [address], [place], [place] [address]   Blood culture (routine single)     Status: None (Preliminary result)   Collection Time: 01/25/22  2:50 PM   Specimen: BLOOD  Result Value Ref Range Status   Specimen  Description   Final    BLOOD RIGHT ANTECUBITAL Performed at [facility], [address], [place], [place] [address]    Special Requests   Final    BOTTLES DRAWN AEROBIC AND ANAEROBIC Blood Culture adequate volume Performed at [facility], [address] [place], [place] [address]    Culture   Final    NO GROWTH 2 DAYS Performed at [facility], [address], [place], [place] [address]    Report Status PENDING  Incomplete  Culture, blood (single)     Status: None (Preliminary result)   Collection Time: 01/25/22  4:29 PM   Specimen: BLOOD  Result Value Ref Range Status   Specimen Description   Final    BLOOD RIGHT ANTECUBITAL Performed at [facility], [address] [place], [place] [address]    Special Requests   Final    BOTTLES DRAWN AEROBIC AND ANAEROBIC Blood Culture results may not be optimal due to an inadequate volume of blood received in culture bottles Performed at [facility], [address] [place], [place] [address]    Culture   Final    NO GROWTH 2 DAYS Performed at [facility], [address] [place], [place] [address]    Report Status PENDING  Incomplete  Urine Culture     Status: Abnormal (Preliminary result)   Collection Time: 01/25/22 11:29 PM   Specimen: Urine, Clean Catch  Result Value Ref Range Status   Specimen Description   Final    URINE, CLEAN CATCH Performed at [facility], [address] [place], [place] [address]    Special Requests   Final    NONE Performed at [facility], [address], [place], [place] [address]    Culture >=100,000 COLONIES/mL SERRATIA MARCESCENS (A)  Final   Report Status PENDING  Incomplete         Radiology Studies: No results found.      Scheduled Meds:  amiodarone  200 mg Oral Daily   digoxin  0.0625 mg Oral Daily   diltiazem  120 mg Oral Daily   enoxaparin  (LOVENOX) injection  40 mg Subcutaneous QHS   ipratropium-albuterol  3 mL Nebulization TID   levothyroxine  112 mcg Oral Q0600   methylPREDNISolone (SOLU-MEDROL) injection  40 mg Intravenous Daily   traZODone  75 mg Oral QHS   Continuous Infusions:  levofloxacin (LEVAQUIN) IV Stopped (01/27/22 1715)          Glade Lloyd, MD [facility] 01/28/2022, 7:59 AM

## 2022-01-29 DIAGNOSIS — N390 Urinary tract infection, site not specified: Secondary | ICD-10-CM | POA: Diagnosis not present

## 2022-01-29 LAB — CBC WITH DIFFERENTIAL/PLATELET
Abs Immature Granulocytes: 0.07 10*3/uL (ref 0.00–0.07)
Basophils Absolute: 0 10*3/uL (ref 0.0–0.1)
Basophils Relative: 0 %
Eosinophils Absolute: 0 10*3/uL (ref 0.0–0.5)
Eosinophils Relative: 0 %
HCT: 31.4 % — ABNORMAL LOW (ref 36.0–46.0)
Hemoglobin: 10.1 g/dL — ABNORMAL LOW (ref 12.0–15.0)
Immature Granulocytes: 1 %
Lymphocytes Relative: 6 %
Lymphs Abs: 0.5 10*3/uL — ABNORMAL LOW (ref 0.7–4.0)
MCH: 28.3 pg (ref 26.0–34.0)
MCHC: 32.2 g/dL (ref 30.0–36.0)
MCV: 88 fL (ref 80.0–100.0)
Monocytes Absolute: 0.3 10*3/uL (ref 0.1–1.0)
Monocytes Relative: 4 %
Neutro Abs: 8.3 10*3/uL — ABNORMAL HIGH (ref 1.7–7.7)
Neutrophils Relative %: 89 %
Platelets: 373 10*3/uL (ref 150–400)
RBC: 3.57 MIL/uL — ABNORMAL LOW (ref 3.87–5.11)
RDW: 13 % (ref 11.5–15.5)
WBC: 9.2 10*3/uL (ref 4.0–10.5)
nRBC: 0 % (ref 0.0–0.2)

## 2022-01-29 LAB — BASIC METABOLIC PANEL
Anion gap: 8 (ref 5–15)
BUN: 18 mg/dL (ref 8–23)
CO2: 26 mmol/L (ref 22–32)
Calcium: 8.9 mg/dL (ref 8.9–10.3)
Chloride: 98 mmol/L (ref 98–111)
Creatinine, Ser: 0.9 mg/dL (ref 0.44–1.00)
GFR, Estimated: >60 mL/min (ref >60)
Glucose, Bld: 139 mg/dL — ABNORMAL HIGH (ref 70–99)
Potassium: 4.1 mmol/L (ref 3.5–5.1)
Sodium: 132 mmol/L — ABNORMAL LOW (ref 135–145)

## 2022-01-29 LAB — MAGNESIUM: Magnesium: 2.1 mg/dL (ref 1.7–2.4)

## 2022-01-29 MED ORDER — METHYLPREDNISOLONE SODIUM SUCC 40 MG IJ SOLR
40.0000 mg | Freq: Every day | INTRAMUSCULAR | Status: DC
  Administered 2022-01-30 – 2022-01-31 (×2): 40 mg via INTRAVENOUS
  Filled 2022-01-29 (×2): qty 1

## 2022-01-29 MED ORDER — FUROSEMIDE 10 MG/ML IJ SOLN
40.0000 mg | Freq: Once | INTRAMUSCULAR | Status: AC
  Administered 2022-01-29: 40 mg via INTRAVENOUS
  Filled 2022-01-29: qty 4

## 2022-01-29 MED ORDER — MELATONIN 5 MG PO TABS
5.0000 mg | ORAL_TABLET | Freq: Every evening | ORAL | Status: DC | PRN
Start: 2022-01-29 — End: 2022-01-31
  Administered 2022-01-30: 5 mg via ORAL
  Filled 2022-01-29: qty 1

## 2022-01-29 NOTE — Plan of Care (Signed)
Pt weaned to 3L this shift. O2 at 94%. Problem: Education: Goal: Knowledge of General Education information will improve Description: Including pain rating scale, medication(s)/side effects and non-pharmacologic comfort measures Outcome: Progressing   Problem: Health Behavior/Discharge Planning: Goal: Ability to manage health-related needs will improve Outcome: Progressing   Problem: Clinical Measurements: Goal: Ability to maintain clinical measurements within normal limits will improve Outcome: Progressing Goal: Will remain free from infection Outcome: Progressing Goal: Diagnostic test results will improve Outcome: Progressing Goal: Respiratory complications will improve Outcome: Progressing Goal: Cardiovascular complication will be avoided Outcome: Progressing   Problem: Activity: Goal: Risk for activity intolerance will decrease Outcome: Progressing   Problem: Nutrition: Goal: Adequate nutrition will be maintained Outcome: Progressing   Problem: Coping: Goal: Level of anxiety will decrease Outcome: Progressing   Problem: Elimination: Goal: Will not experience complications related to bowel motility Outcome: Progressing Goal: Will not experience complications related to urinary retention Outcome: Progressing   Problem: Pain Managment: Goal: General experience of comfort will improve Outcome: Progressing   Problem: Safety: Goal: Ability to remain free from injury will improve Outcome: Progressing   Problem: Skin Integrity: Goal: Risk for impaired skin integrity will decrease Outcome: Progressing

## 2022-01-29 NOTE — Progress Notes (Signed)
PROGRESS NOTE  Jacqueline Kim XFG:[id number] DOB: 20-Mar-1946 DOA: 01/25/2022 PCP: Georgianne Fick, MD   LOS: 3 days   Brief Narrative / Interim history: 76 year old female with history of PAF not on anticoagulation, HTN, SSS status post pacemaker, hypothyroidism, comes into the hospital with generalized weakness, shortness of breath.  She was found to be hypoxic with sats of 78% on room air, and was placed on antibiotics due to concern for urinary tract infection.  Subjective / 24h Interval events: She is doing better.  Still shortness of breath at times but feels improved compared to yesterday  Assesement and Plan: Principal Problem:   UTI (urinary tract infection) Active Problems:   Sinus node dysfunction (HCC)   HTN (hypertension)   Paroxysmal atrial fibrillation (HCC)   Hyponatremia   Normocytic anemia   Hypoxia   Thrombocytosis   Principal problem Urinary tract infection, POA-urine cultures grew Serratia resistant to cefazolin and nitrofurantoin but otherwise sensitive.  She has been placed on Levaquin, continue for now.  Active problems Acute hypoxic respiratory failure, probable acute on chronic diastolic CHF-there was concern for bronchitis, antibiotics above should cover.  Respiratory status worsened 8/15, also had wheezing, and has been placed on steroids.  She underwent a CT angiogram 8/15 which was negative for PE but it did show moderate bilateral pleural effusions, as well as upper lobe infiltrates suspicious for pulmonary edema but atypical infection could not be excluded, suspect related to receiving IV fluids initially in the setting of infection.  Received Lasix yesterday, respiratory status improved today.  Repeat Lasix again  Mild dementia-slightly confused but appropriate for most part.  Essential hypertension-continue Cardizem  PAF-continue Cardizem, amiodarone, digoxin.  Not on anticoagulation because of risk of falls.  Hypothyroidism-Continue  levothyroxine   Anemia of chronic disease-Possibly from chronic illnesses.  Hemoglobin stable..  Monitor intermittently   Thrombocytosis -Possibly reactive.  Resolved   Hyponatremia -Mild.  Overall stable   Sick sinus syndrome status post permanent pacemaker placement -Outpatient follow-up with cardiology  Scheduled Meds:  amiodarone  200 mg Oral Daily   digoxin  0.0625 mg Oral Daily   diltiazem  120 mg Oral Daily   enoxaparin (LOVENOX) injection  40 mg Subcutaneous QHS   ipratropium-albuterol  3 mL Nebulization TID   levothyroxine  112 mcg Oral Q0600   methylPREDNISolone (SOLU-MEDROL) injection  40 mg Intravenous Q12H   mometasone-formoterol  2 puff Inhalation BID   traZODone  75 mg Oral QHS   Continuous Infusions:  levofloxacin (LEVAQUIN) IV Stopped (01/27/22 1715)   PRN Meds:.acetaminophen **OR** acetaminophen, albuterol, benzonatate, guaiFENesin-dextromethorphan, ondansetron **OR** ondansetron (ZOFRAN) IV, oxyCODONE, senna-docusate  Diet Orders (From admission, onward)     Start     Ordered   01/25/22 1708  Diet Heart Room service appropriate? Yes; Fluid consistency: Thin; Fluid restriction: 1500 mL Fluid  Diet effective now       Question Answer Comment  Room service appropriate? Yes   Fluid consistency: Thin   Fluid restriction: 1500 mL Fluid      01/25/22 1709            DVT prophylaxis: enoxaparin (LOVENOX) injection 40 mg Start: 01/25/22 2200   Lab Results  Component Value Date   PLT 373 01/29/2022      Code Status: Full Code  Family Communication: Family at bedside, will call daughter later  Status is: Inpatient Remains inpatient appropriate because: Shortness of breath   Level of care: Med-Surg   Objective: Vitals:   01/29/22 0501 01/29/22 0740  01/29/22 0741 01/29/22 0845  BP: (!) 146/66     Pulse: 71     Resp: (!) 21     Temp: 97.9 F (36.6 C)     TempSrc:      SpO2: 92% 96% 96% 92%  Weight: 54 kg     Height:        Intake/Output  Summary (Last 24 hours) at 01/29/2022 1128 Last data filed at 01/29/2022 1054 Gross per 24 hour  Intake 760 ml  Output 1700 ml  Net -940 ml   Wt Readings from Last 3 Encounters:  01/29/22 54 kg  01/13/22 54.2 kg  12/31/21 55.6 kg    Examination:  Constitutional: NAD Eyes: no scleral icterus ENMT: Mucous membranes are moist.  Neck: normal, supple Respiratory: clear to auscultation bilaterally, faint bibasilar crackles.  Tachypneic at times Cardiovascular: Regular rate and rhythm, no murmurs / rubs / gallops.  Trace edema Abdomen: non distended, no tenderness. Bowel sounds positive.  Musculoskeletal: no clubbing / cyanosis.  Skin: no rashes Neurologic: non focal   Data Reviewed: I have independently reviewed following labs and imaging studies   CBC Recent Labs  Lab 01/25/22 1454 01/26/22 0553 01/29/22 0802  WBC 8.3 7.0 9.2  HGB 11.2* 12.8 10.1*  HCT 34.6* 42.0 31.4*  PLT 413* 295 373  MCV 87.6 92.1 88.0  MCH 28.4 28.1 28.3  MCHC 32.4 30.5 32.2  RDW 12.9 12.8 13.0  LYMPHSABS 0.8  --  0.5*  MONOABS 0.8  --  0.3  EOSABS 0.1  --  0.0  BASOSABS 0.1  --  0.0    Recent Labs  Lab 01/25/22 1403 01/25/22 1454 01/25/22 1455 01/25/22 1630 01/26/22 0553 01/26/22 0826 01/27/22 0504 01/28/22 0640 01/29/22 0802  NA  --  130*  --   --  132*  --  130* 133* 132*  K  --  4.5  --   --  4.3  --  4.1 4.7 4.1  CL  --  99  --   --  102  --  98 103 98  CO2  --  23  --   --  22  --  23 25 26   GLUCOSE  --  102*  --   --  88  --  103* 133* 139*  BUN  --  18  --   --  13  --  16 15 18   CREATININE  --  1.07*  --   --  0.83  --  0.89 0.70 0.90  CALCIUM  --  9.1  --   --  8.6*  --  8.1* 8.8* 8.9  AST  --  21  --   --  18  --   --   --   --   ALT  --  10  --   --  9  --   --   --   --   ALKPHOS  --  56  --   --  45  --   --   --   --   BILITOT  --  0.5  --   --  0.4  --   --   --   --   ALBUMIN  --  3.3*  --   --  2.7*  --   --   --   --   MG  --   --   --   --  2.0  --  1.9 2.3  2.1  PROCALCITON  --   --   --   --   --  <  0.10 <0.10  --   --   LATICACIDVEN 1.0  --   --   --   --   --   --   --   --   INR  --   --  1.0  --   --   --   --   --   --   BNP  --   --   --  16.4  --   --   --   --   --     ------------------------------------------------------------------------------------------------------------------ No results for input(s): "CHOL", "HDL", "LDLCALC", "TRIG", "CHOLHDL", "LDLDIRECT" in the last 72 hours.  Lab Results  Component Value Date   HGBA1C 5.3 04/01/2011   ------------------------------------------------------------------------------------------------------------------ No results for input(s): "TSH", "T4TOTAL", "T3FREE", "THYROIDAB" in the last 72 hours.  Invalid input(s): "FREET3"  Cardiac Enzymes No results for input(s): "CKMB", "TROPONINI", "MYOGLOBIN" in the last 168 hours.  Invalid input(s): "CK" ------------------------------------------------------------------------------------------------------------------    Component Value Date/Time   BNP 16.4 01/25/2022 1630    CBG: No results for input(s): "GLUCAP" in the last 168 hours.  Recent Results (from the past 240 hour(s))  SARS Coronavirus 2 by RT PCR (hospital order, performed in [facility] hospital lab) *cepheid single result test* Anterior Nasal Swab     Status: None   Collection Time: 01/25/22  1:48 PM   Specimen: Anterior Nasal Swab  Result Value Ref Range Status   SARS Coronavirus 2 by RT PCR NEGATIVE NEGATIVE Final    Comment: (NOTE) SARS-CoV-2 target nucleic acids are NOT DETECTED.  The SARS-CoV-2 RNA is generally detectable in upper and lower respiratory specimens during the acute phase of infection. The lowest concentration of SARS-CoV-2 viral copies this assay can detect is 250 copies / mL. A negative result does not preclude SARS-CoV-2 infection and should not be used as the sole basis for treatment or other patient management decisions.  A negative result may  occur with improper specimen collection / handling, submission of specimen other than nasopharyngeal swab, presence of viral mutation(s) within the areas targeted by this assay, and inadequate number of viral copies (<250 copies / mL). A negative result must be combined with clinical observations, patient history, and epidemiological information.  Fact Sheet for Patients:   [employer]LapTop[employer]co.za  Fact Sheet for Healthcare Providers: http://kim-miller.com/  This test is not yet approved or  cleared by the [place] FDA and has been authorized for detection and/or diagnosis of SARS-CoV-2 by FDA under an Emergency Use Authorization (EUA).  This EUA will remain in effect (meaning this test can be used) for the duration of the COVID-19 declaration under Section 564(b)(1) of the Act, 21 U.S.C. section 360bbb-3(b)(1), unless the authorization is terminated or revoked sooner.  Performed at [facility], [address], [place], [place] [address]   Blood culture (routine single)     Status: None (Preliminary result)   Collection Time: 01/25/22  2:50 PM   Specimen: BLOOD  Result Value Ref Range Status   Specimen Description   Final    BLOOD RIGHT ANTECUBITAL Performed at [facility], [address] [place], [place] [address]    Special Requests   Final    BOTTLES DRAWN AEROBIC AND ANAEROBIC Blood Culture adequate volume Performed at [facility], [address], [place], [place] [address]    Culture   Final    NO GROWTH 4 DAYS Performed at [facility], [address] [place], [place] [address]  Report Status PENDING  Incomplete  Culture, blood (single)     Status: None (Preliminary result)   Collection Time: 01/25/22  4:29 PM   Specimen: BLOOD  Result Value Ref Range Status   Specimen Description   Final    BLOOD RIGHT ANTECUBITAL Performed at [place] [address], [place], [place] [address]    Special Requests   Final    BOTTLES DRAWN AEROBIC AND ANAEROBIC Blood Culture results may not be optimal due to an inadequate volume of blood received in culture bottles Performed at [place] [address] [place], [place] [address]    Culture   Final    NO GROWTH 4 DAYS Performed at [facility], [place] [address] [place], [place] [address]    Report Status PENDING  Incomplete  Urine Culture     Status: Abnormal   Collection Time: 01/25/22 11:29 PM   Specimen: Urine, Clean Catch  Result Value Ref Range Status   Specimen Description   Final    URINE, CLEAN CATCH Performed at [facility], [place] [address], [place], [place] [address]    Special Requests   Final    NONE Performed at [facility], [place] [address], [place], [place] [address]    Culture >=100,000 COLONIES/mL SERRATIA MARCESCENS (A)  Final   Report Status 01/28/2022 FINAL  Final   Organism ID, Bacteria SERRATIA MARCESCENS (A)  Final      Susceptibility   Serratia marcescens - MIC*    CEFAZOLIN >=64 RESISTANT Resistant     CEFEPIME <=0.12 SENSITIVE Sensitive     CEFTRIAXONE <=0.25 SENSITIVE Sensitive     CIPROFLOXACIN <=0.25 SENSITIVE Sensitive     GENTAMICIN <=1 SENSITIVE Sensitive     NITROFURANTOIN >=512 RESISTANT Resistant     TRIMETH/SULFA <=20 SENSITIVE Sensitive     * >=100,000 COLONIES/mL SERRATIA MARCESCENS     Radiology Studies: No results found.   Marzetta Board, MD, PhD [facility]  Between 7 am - 7 pm I am available, please contact me via Amion (for emergencies) or Securechat (non urgent messages)  Between 7 pm - 7 am I am not available, please contact night coverage MD/APP via Amion

## 2022-01-30 DIAGNOSIS — N390 Urinary tract infection, site not specified: Secondary | ICD-10-CM | POA: Diagnosis not present

## 2022-01-30 LAB — CULTURE, BLOOD (SINGLE)
Culture: NO GROWTH
Culture: NO GROWTH
Special Requests: ADEQUATE

## 2022-01-30 LAB — BASIC METABOLIC PANEL
Anion gap: 7 (ref 5–15)
BUN: 22 mg/dL (ref 8–23)
CO2: 28 mmol/L (ref 22–32)
Calcium: 8.7 mg/dL — ABNORMAL LOW (ref 8.9–10.3)
Chloride: 97 mmol/L — ABNORMAL LOW (ref 98–111)
Creatinine, Ser: 1.04 mg/dL — ABNORMAL HIGH (ref 0.44–1.00)
GFR, Estimated: 55 mL/min — ABNORMAL LOW (ref >60)
Glucose, Bld: 122 mg/dL — ABNORMAL HIGH (ref 70–99)
Potassium: 3.5 mmol/L (ref 3.5–5.1)
Sodium: 132 mmol/L — ABNORMAL LOW (ref 135–145)

## 2022-01-30 MED ORDER — POTASSIUM CHLORIDE CRYS ER 20 MEQ PO TBCR
40.0000 meq | EXTENDED_RELEASE_TABLET | Freq: Once | ORAL | Status: AC
  Administered 2022-01-30: 40 meq via ORAL
  Filled 2022-01-30: qty 2

## 2022-01-30 MED ORDER — FUROSEMIDE 10 MG/ML IJ SOLN
40.0000 mg | Freq: Once | INTRAMUSCULAR | Status: AC
  Administered 2022-01-30: 40 mg via INTRAVENOUS
  Filled 2022-01-30: qty 4

## 2022-01-30 NOTE — Progress Notes (Signed)
Physical Therapy Treatment Patient Details Name: Jacqueline Kim MRN: [id number] DOB: [birth year] Today's Date: 01/30/2022   History of Present Illness 76 y.o. female with medical history significant of paroxysmal A-fib not on anticoagulation, hypertension, sick sinus syndrome status post permanent pacemaker, hypothyroidism, depression, dementia.  presented with generalized weakness and worsening shortness of breath,  UA suggestive of UTI.  Chest x-ray showed no acute abnormality    PT Comments    Pt supine in bed and agreeable to be seen, reports she did ambulate early today with nursing staff but had to take a seated rest break to recover O2. Pt on 3LO2 via [place], SpO2 89-100% throughout session though pulseox in room had poor pleth. Pt demonstrated modified independence with bed mobility and transfers, supervision for ambulation in hallway 119f with single standing rest break with pursed lip breathing to recover O2 from 88%SpO2. Pt impulsive and kept picking up RW despite cuing; encouraged pt to use RW upon discharge for increased stability and safety, pt verbalized understanding. Adding pt to mobility specialist caseload for increased ambulation practice. Pt has met acute therapy mobility goals for safe discharge home with HHPT, PT is signing off, should needs change please re-consult. Thank you for this referral.     Recommendations for follow up therapy are one component of a multi-disciplinary discharge planning process, led by the attending physician.  Recommendations may be updated based on patient status, additional functional criteria and insurance authorization.  Follow Up Recommendations  Home health PT     Assistance Recommended at Discharge Intermittent Supervision/Assistance  Patient can return home with the following Help with stairs or ramp for entrance;Assistance with cooking/housework;Assist for transportation   Equipment Recommendations  Rolling walker (2 wheels)     Recommendations for Other Services       Precautions / Restrictions Precautions Precautions: Fall Precaution Comments: monitor O2 Restrictions Weight Bearing Restrictions: No     Mobility  Bed Mobility Overal bed mobility: Modified Independent       Supine to sit: Modified independent (Device/Increase time) Sit to supine: Modified independent (Device/Increase time)   General bed mobility comments: Increased time, pt uses bed rail    Transfers Overall transfer level: Needs assistance Equipment used: Rolling walker (2 wheels) Transfers: Sit to/from Stand Sit to Stand: Modified independent (Device/Increase time)           General transfer comment: Increased time, use of RW.    Ambulation/Gait Ambulation/Gait assistance: Supervision Gait Distance (Feet): 160 Feet Assistive device: Rolling walker (2 wheels) Gait Pattern/deviations: Step-to pattern, Drifts right/left Gait velocity: decreased     General Gait Details: Pt ambulated 1665fwith RW and supervision, mild drifting R/L with pt impulsively picking up and putting down RW despite PT cuing to simply advance AD. 3LO2 via [place]; SpO2 monitored throughout, ranged 89-100%, recovered quickly with standing rest break and pursed lip breathing.   Stairs             Wheelchair Mobility    Modified Rankin (Stroke Patients Only)       Balance Overall balance assessment: Needs assistance Sitting-balance support: Feet supported, No upper extremity supported Sitting balance-Leahy Scale: Good     Standing balance support: During functional activity, Reliant on assistive device for balance, Bilateral upper extremity supported Standing balance-Leahy Scale: Poor                              Cognition Arousal/Alertness: Awake/alert Behavior During Therapy: [facility]  for tasks assessed/performed Overall Cognitive Status: Within Functional Limits for tasks assessed                                           Exercises      General Comments        Pertinent Vitals/Pain Pain Assessment Pain Assessment: No/denies pain    Home Living                          Prior Function            PT Goals (current goals can now be found in the care plan section) Acute Rehab PT Goals Patient Stated Goal: to feel better PT Goal Formulation: With patient Time For Goal Achievement: 02/10/22 Potential to Achieve Goals: Good Progress towards PT goals: Progressing toward goals    Frequency    Min 3X/week      PT Plan Current plan remains appropriate    Co-evaluation              AM-PAC PT "6 Clicks" Mobility   Outcome Measure  Help needed turning from your back to your side while in a flat bed without using bedrails?: None Help needed moving from lying on your back to sitting on the side of a flat bed without using bedrails?: None Help needed moving to and from a bed to a chair (including a wheelchair)?: A Little Help needed standing up from a chair using your arms (e.g., wheelchair or bedside chair)?: A Little Help needed to walk in hospital room?: A Lot Help needed climbing 3-5 steps with a railing? : A Lot 6 Click Score: 18    End of Session Equipment Utilized During Treatment: Oxygen Activity Tolerance: Patient limited by fatigue Patient left: in bed;with call bell/phone within reach;with bed alarm set;with nursing/sitter in room (MD in room) Nurse Communication: Mobility status (SpO2) PT Visit Diagnosis: Unsteadiness on feet (R26.81);Muscle weakness (generalized) (M62.81)     Time: 7948-0165 PT Time Calculation (min) (ACUTE ONLY): 17 min  Charges:  $Gait Training: 8-22 mins                    Coolidge Breeze, PT, DPT [facility] Office: [phone] Pager: [phone]  Coolidge Breeze 01/30/2022, 1:47 PM

## 2022-01-30 NOTE — Progress Notes (Signed)
Jacqueline Kim:[id number] DOB: 02/14/1946 DOA: 01/25/2022 PCP: Georgianne Fick, MD   LOS: 4 days   Brief Narrative / Interim history: 76 year old female with history of PAF not on anticoagulation, HTN, SSS status post pacemaker, hypothyroidism, comes into the hospital with generalized weakness, shortness of breath.  She was found to be hypoxic with sats of 78% on room air, and was placed on antibiotics due to concern for urinary tract infection.  Subjective / 24h Interval events: She wants to go home. States that she is feeling better.   Assesement and Plan: Principal Problem:   UTI (urinary tract infection) Active Problems:   Sinus node dysfunction (HCC)   HTN (hypertension)   Paroxysmal atrial fibrillation (HCC)   Hyponatremia   Normocytic anemia   Hypoxia   Thrombocytosis   Principal problem Urinary tract infection, POA-urine cultures grew Serratia resistant to cefazolin and nitrofurantoin but otherwise sensitive.  She has been placed on Levaquin, continue for now.  Active problems Acute hypoxic respiratory failure, probable acute on chronic diastolic CHF-there was concern for bronchitis, antibiotics above should cover.  Respiratory status worsened 8/15, also had wheezing, and has been placed on steroids.  She underwent a CT angiogram 8/15 which was negative for PE but it did show moderate bilateral pleural effusions, as well as upper lobe infiltrates suspicious for pulmonary edema but atypical infection could not be excluded, suspect related to receiving IV fluids initially in the setting of infection. Repeat IV Lasix today. Continue Levaquin. Persistently hypoxic requiring 3L [place]. Obtain an updated 2d echocardiogram  Mild dementia-slightly confused but appropriate for most part.  Essential hypertension-continue Cardizem  PAF-continue Cardizem, amiodarone, digoxin.  Not on anticoagulation because of risk of falls.  Hypothyroidism-Continue levothyroxine    Anemia of chronic disease-Possibly from chronic illnesses.  Hemoglobin stable..  Monitor intermittently   Thrombocytosis -Possibly reactive.  Resolved   Hyponatremia -Mild.  Overall stable   Sick sinus syndrome status post permanent pacemaker placement -Outpatient follow-up with cardiology. 2D echo as above  Scheduled Meds:  amiodarone  200 mg Oral Daily   digoxin  0.0625 mg Oral Daily   diltiazem  120 mg Oral Daily   enoxaparin (LOVENOX) injection  40 mg Subcutaneous QHS   furosemide  40 mg Intravenous Once   ipratropium-albuterol  3 mL Nebulization TID   levothyroxine  112 mcg Oral Q0600   methylPREDNISolone (SOLU-MEDROL) injection  40 mg Intravenous Daily   mometasone-formoterol  2 puff Inhalation BID   potassium chloride  40 mEq Oral Once   traZODone  75 mg Oral QHS   Continuous Infusions:   PRN Meds:.acetaminophen **OR** acetaminophen, albuterol, benzonatate, guaiFENesin-dextromethorphan, melatonin, ondansetron **OR** ondansetron (ZOFRAN) IV, oxyCODONE, senna-docusate  Diet Orders (From admission, onward)     Start     Ordered   01/25/22 1708  Diet Heart Room service appropriate? Yes; Fluid consistency: Thin; Fluid restriction: 1500 mL Fluid  Diet effective now       Question Answer Comment  Room service appropriate? Yes   Fluid consistency: Thin   Fluid restriction: 1500 mL Fluid      01/25/22 1709            DVT prophylaxis: enoxaparin (LOVENOX) injection 40 mg Start: 01/25/22 2200   Lab Results  Component Value Date   PLT 373 01/29/2022      Code Status: Full Code  Family Communication: Family at bedside, will call daughter later  Status is: Inpatient Remains inpatient appropriate because: Shortness of breath  Level of care: Med-Surg   Objective: Vitals:   01/29/22 2120 01/30/22 0313 01/30/22 0745 01/30/22 1301  BP: (!) 142/60 130/72  127/65  Pulse: 93 93  64  Resp: (!) 21 (!) 21  18  Temp: 97.7 F (36.5 C) (!) 97.5 F (36.4 C)  97.9 F  (36.6 C)  TempSrc:    Oral  SpO2: 99% 93% (!) 88% 98%  Weight:      Height:        Intake/Output Summary (Last 24 hours) at 01/30/2022 1354 Last data filed at 01/30/2022 1200 Gross per 24 hour  Intake 318 ml  Output 1300 ml  Net -982 ml    Wt Readings from Last 3 Encounters:  01/29/22 54 kg  01/13/22 54.2 kg  12/31/21 55.6 kg    Examination:   Constitutional: NAD Eyes: lids and conjunctivae normal, no scleral icterus ENMT: mmm Neck: normal, supple Respiratory: faint rhonchi, no wheezing.  Cardiovascular: Regular rate and rhythm, no murmurs / rubs / gallops. No LE edema. Abdomen: soft, no distention, no tenderness. Bowel sounds positive.  Skin: no rashes Neurologic: no focal deficits, equal strength   Data Reviewed: I have independently reviewed following labs and imaging studies   CBC Recent Labs  Lab 01/25/22 1454 01/26/22 0553 01/29/22 0802  WBC 8.3 7.0 9.2  HGB 11.2* 12.8 10.1*  HCT 34.6* 42.0 31.4*  PLT 413* 295 373  MCV 87.6 92.1 88.0  MCH 28.4 28.1 28.3  MCHC 32.4 30.5 32.2  RDW 12.9 12.8 13.0  LYMPHSABS 0.8  --  0.5*  MONOABS 0.8  --  0.3  EOSABS 0.1  --  0.0  BASOSABS 0.1  --  0.0     Recent Labs  Lab 01/25/22 1403 01/25/22 1454 01/25/22 1454 01/25/22 1455 01/25/22 1630 01/26/22 0553 01/26/22 0826 01/27/22 0504 01/28/22 0640 01/29/22 0802 01/30/22 0621  NA  --  130*   < >  --   --  132*  --  130* 133* 132* 132*  K  --  4.5   < >  --   --  4.3  --  4.1 4.7 4.1 3.5  CL  --  99   < >  --   --  102  --  98 103 98 97*  CO2  --  23   < >  --   --  22  --  23 25 26 28   GLUCOSE  --  102*   < >  --   --  88  --  103* 133* 139* 122*  BUN  --  18   < >  --   --  13  --  16 15 18 22   CREATININE  --  1.07*   < >  --   --  0.83  --  0.89 0.70 0.90 1.04*  CALCIUM  --  9.1   < >  --   --  8.6*  --  8.1* 8.8* 8.9 8.7*  AST  --  21  --   --   --  18  --   --   --   --   --   ALT  --  10  --   --   --  9  --   --   --   --   --   ALKPHOS  --  56   --   --   --  45  --   --   --   --   --  BILITOT  --  0.5  --   --   --  0.4  --   --   --   --   --   ALBUMIN  --  3.3*  --   --   --  2.7*  --   --   --   --   --   MG  --   --   --   --   --  2.0  --  1.9 2.3 2.1  --   PROCALCITON  --   --   --   --   --   --  <0.10 <0.10  --   --   --   LATICACIDVEN 1.0  --   --   --   --   --   --   --   --   --   --   INR  --   --   --  1.0  --   --   --   --   --   --   --   BNP  --   --   --   --  16.4  --   --   --   --   --   --    < > = values in this interval not displayed.     ------------------------------------------------------------------------------------------------------------------ No results for input(s): "CHOL", "HDL", "LDLCALC", "TRIG", "CHOLHDL", "LDLDIRECT" in the last 72 hours.  Lab Results  Component Value Date   HGBA1C 5.3 04/01/2011   ------------------------------------------------------------------------------------------------------------------ No results for input(s): "TSH", "T4TOTAL", "T3FREE", "THYROIDAB" in the last 72 hours.  Invalid input(s): "FREET3"  Cardiac Enzymes No results for input(s): "CKMB", "TROPONINI", "MYOGLOBIN" in the last 168 hours.  Invalid input(s): "CK" ------------------------------------------------------------------------------------------------------------------    Component Value Date/Time   BNP 16.4 01/25/2022 1630    CBG: No results for input(s): "GLUCAP" in the last 168 hours.  Recent Results (from the past 240 hour(s))  SARS Coronavirus 2 by RT PCR (hospital order, performed in [facility] hospital lab) *cepheid single result test* Anterior Nasal Swab     Status: None   Collection Time: 01/25/22  1:48 PM   Specimen: Anterior Nasal Swab  Result Value Ref Range Status   SARS Coronavirus 2 by RT PCR NEGATIVE NEGATIVE Final    Comment: (NOTE) SARS-CoV-2 target nucleic acids are NOT DETECTED.  The SARS-CoV-2 RNA is generally detectable in upper and lower respiratory  specimens during the acute phase of infection. The lowest concentration of SARS-CoV-2 viral copies this assay can detect is 250 copies / mL. A negative result does not preclude SARS-CoV-2 infection and should not be used as the sole basis for treatment or other patient management decisions.  A negative result may occur with improper specimen collection / handling, submission of specimen other than nasopharyngeal swab, presence of viral mutation(s) within the areas targeted by this assay, and inadequate number of viral copies (<250 copies / mL). A negative result must be combined with clinical observations, patient history, and epidemiological information.  Fact Sheet for Patients:   RoadLapTop.co.za  Fact Sheet for Healthcare Providers: http://kim-miller.com/  This test is not yet approved or  cleared by the [place] FDA and has been authorized for detection and/or diagnosis of SARS-CoV-2 by FDA under an Emergency Use Authorization (EUA).  This EUA will remain in effect (meaning this test can be used) for the duration of the COVID-19 declaration under Section 564(b)(1) of the Act, 21 U.S.C. section 360bbb-3(b)(1), unless the  authorization is terminated or revoked sooner.  Performed at [facility], [address], [place], [place] [address]   Blood culture (routine single)     Status: None   Collection Time: 01/25/22  2:50 PM   Specimen: BLOOD  Result Value Ref Range Status   Specimen Description   Final    BLOOD RIGHT ANTECUBITAL Performed at [facility], [address] [place], [place] [address]    Special Requests   Final    BOTTLES DRAWN AEROBIC AND ANAEROBIC Blood Culture adequate volume Performed at [facility], [address] [place], [place] [address]    Culture   Final    NO GROWTH 5 DAYS Performed at [facility], [address] [place], [place]  [address]    Report Status 01/30/2022 FINAL  Final  Culture, blood (single)     Status: None   Collection Time: 01/25/22  4:29 PM   Specimen: BLOOD  Result Value Ref Range Status   Specimen Description   Final    BLOOD RIGHT ANTECUBITAL Performed at [facility], [address] [place], [place] [address]    Special Requests   Final    BOTTLES DRAWN AEROBIC AND ANAEROBIC Blood Culture results may not be optimal due to an inadequate volume of blood received in culture bottles Performed at [facility], [address], [place], [place] [address]    Culture   Final    NO GROWTH 5 DAYS Performed at [facility], [address] [place], [place] [address]    Report Status 01/30/2022 FINAL  Final  Urine Culture     Status: Abnormal   Collection Time: 01/25/22 11:29 PM   Specimen: Urine, Clean Catch  Result Value Ref Range Status   Specimen Description   Final    URINE, CLEAN CATCH Performed at [facility], [address] [place], [place] [address]    Special Requests   Final    NONE Performed at [facility], [address], [place], [place] [address]    Culture >=100,000 COLONIES/mL SERRATIA MARCESCENS (A)  Final   Report Status 01/28/2022 FINAL  Final   Organism ID, Bacteria SERRATIA MARCESCENS (A)  Final      Susceptibility   Serratia marcescens - MIC*    CEFAZOLIN >=64 RESISTANT Resistant     CEFEPIME <=0.12 SENSITIVE Sensitive     CEFTRIAXONE <=0.25 SENSITIVE Sensitive     CIPROFLOXACIN <=0.25 SENSITIVE Sensitive     GENTAMICIN <=1 SENSITIVE Sensitive     NITROFURANTOIN >=512 RESISTANT Resistant     TRIMETH/SULFA <=20 SENSITIVE Sensitive     * >=100,000 COLONIES/mL SERRATIA MARCESCENS     Radiology Studies: No results found.   Pamella Pert, MD, PhD [facility]  Between 7 am - 7 pm I am available, please contact me via Amion (for emergencies) or Securechat (non urgent  messages)  Between 7 pm - 7 am I am not available, please contact night coverage MD/APP via Amion

## 2022-01-30 NOTE — Plan of Care (Signed)

## 2022-01-30 NOTE — Progress Notes (Signed)
SATURATION QUALIFICATIONS: (This note is used to comply with regulatory documentation for home oxygen)  Patient Saturations on Room Air at Rest = 85%  Patient Saturations on Room Air while Ambulating = 78%  Patient Saturations on 3 Liters of oxygen while Ambulating = 92%  Please briefly explain why patient needs home oxygen: needs home o2 to maintain sats

## 2022-01-30 NOTE — Care Management Important Message (Signed)
Important Message  Patient Details IM Letter placed in Patients room. Name: Jacqueline Kim MRN: [id number] Date of Birth: June 05, 1946   Medicare Important Message Given:  Yes     Caren Macadam 01/30/2022, 10:54 AM

## 2022-01-31 ENCOUNTER — Inpatient Hospital Stay (HOSPITAL_COMMUNITY): Payer: Medicare Other

## 2022-01-31 DIAGNOSIS — R0602 Shortness of breath: Secondary | ICD-10-CM | POA: Diagnosis not present

## 2022-01-31 DIAGNOSIS — N390 Urinary tract infection, site not specified: Secondary | ICD-10-CM | POA: Diagnosis not present

## 2022-01-31 LAB — ECHOCARDIOGRAM COMPLETE
AR max vel: 2.53 cm2
AV Area VTI: 2.33 cm2
AV Area mean vel: 2.31 cm2
AV Mean grad: 6 mmHg
AV Peak grad: 11 mmHg
Ao pk vel: 1.66 m/s
Area-P 1/2: 4.8 cm2
Calc EF: 75.2 %
Height: 63 in
MV VTI: 2.09 cm2
Radius: 0.2 cm
S' Lateral: 3 cm
Single Plane A2C EF: 75.5 %
Single Plane A4C EF: 76.5 %
Weight: 1894.19 oz

## 2022-01-31 LAB — BASIC METABOLIC PANEL
Anion gap: 7 (ref 5–15)
BUN: 28 mg/dL — ABNORMAL HIGH (ref 8–23)
CO2: 30 mmol/L (ref 22–32)
Calcium: 8.9 mg/dL (ref 8.9–10.3)
Chloride: 98 mmol/L (ref 98–111)
Creatinine, Ser: 1.11 mg/dL — ABNORMAL HIGH (ref 0.44–1.00)
GFR, Estimated: 51 mL/min — ABNORMAL LOW (ref >60)
Glucose, Bld: 111 mg/dL — ABNORMAL HIGH (ref 70–99)
Potassium: 4.5 mmol/L (ref 3.5–5.1)
Sodium: 135 mmol/L (ref 135–145)

## 2022-01-31 MED ORDER — PREDNISONE 10 MG PO TABS: 40.0000 mg | ORAL_TABLET | Freq: Every day | ORAL | 0 refills | Status: AC

## 2022-01-31 MED ORDER — LEVOFLOXACIN 750 MG PO TABS: 750.0000 mg | ORAL_TABLET | ORAL | 0 refills | Status: AC

## 2022-01-31 MED ORDER — GUAIFENESIN-DM 100-10 MG/5ML PO SYRP: 10.0000 mL | ORAL_SOLUTION | ORAL | 0 refills | Status: DC | PRN

## 2022-01-31 NOTE — TOC Transition Note (Signed)
Transition of Care [facility]) - CM/SW Discharge Note   Patient Details  Name: Jacqueline Kim MRN: [id number] Date of Birth: 1946-05-27  [facility] CM/SW Contact:  Otelia Santee, LCSW Phone Number: 01/31/2022, 9:12 AM   Clinical Narrative:    Pt is to return home with home health PT provided through [facility]. Pt requiring 2L of O2. Home O2 has been ordered through [employer] and will be delivered to pt's room prior to discharge. No further DME needs identified.    Final next level of care: Home w Home Health Services Barriers to Discharge: No Barriers Identified   Patient Goals and CMS Choice Patient states their goals for this hospitalization and ongoing recovery are:: To return home   Choice offered to / list presented to : Patient, Adult Children  Discharge Placement                       Discharge Plan and Services In-house Referral: Clinical Social Work Discharge Planning Services: CM Consult Post Acute Care Choice: Home Health          DME Arranged: Oxygen DME Agency: [employer] Date DME Agency Contacted: 01/31/22 Time DME Agency Contacted: [phone] Representative spoke with at DME Agency: Elease Hashimoto HH Arranged: PT HH Agency: [facility] Date [facility] Agency Contacted: 01/27/22 Time HH Agency Contacted: 1450 Representative spoke with at [facility] Agency: Denyse Amass  Social Determinants of Health (SDOH) Interventions     Readmission Risk Interventions    01/31/2022    9:11 AM 01/27/2022    2:49 PM  Readmission Risk Prevention Plan  Post Dischage Appt  Complete  Medication Screening  Complete  Transportation Screening Complete Complete  PCP or Specialist Appt within 5-7 Days Complete   Home Care Screening Complete   Medication Review (RN CM) Complete

## 2022-01-31 NOTE — Discharge Summary (Signed)
Physician Discharge Summary  Jacqueline Kim NWG:[id number] DOB: 04-29-1946 DOA: 01/25/2022  PCP: Georgianne Fick, MD  Admit date: 01/25/2022 Discharge date: 01/31/2022  Admitted From: home Disposition:  home  Recommendations for Outpatient Follow-up:  Follow up with PCP in 1-2 weeks  Home Health: PT Equipment/Devices: home O2, 2L  Discharge Condition: stable CODE STATUS: Full code Diet Orders (From admission, onward)     Start     Ordered   01/25/22 1708  Diet Heart Room service appropriate? Yes; Fluid consistency: Thin; Fluid restriction: 1500 mL Fluid  Diet effective now       Question Answer Comment  Room service appropriate? Yes   Fluid consistency: Thin   Fluid restriction: 1500 mL Fluid      01/25/22 1709            HPI: Per admitting MD, Jacqueline Kim is a 76 y.o. female with medical history significant of paroxysmal A-fib not on anticoagulation, hypertension, sick sinus syndrome status post permanent pacemaker, hypothyroidism, depression presents with generalized weakness and worsening shortness of breath. Patient apparently has been having URI-like symptoms for the last 5 days along with cough mostly nonproductive but sounding wet but without any fevers or chills.  Patient went to urgent care, oxygen saturations were found in the low 90s but dropped into the 80s on ambulation and chest x-ray showed?  Multifocal pneumonia.  Patient was sent to the ED.  No chest pain, loss of consciousness, seizures, recent travel, sick contacts, diarrhea.  Patient has had urinary urgency and frequency.  Hospital Course / Discharge diagnoses: Principal Problem:   UTI (urinary tract infection) Active Problems:   Sinus node dysfunction (HCC)   HTN (hypertension)   Paroxysmal atrial fibrillation (HCC)   Hyponatremia   Normocytic anemia   Hypoxia   Thrombocytosis   Principal problem Urinary tract infection, POA-urine cultures grew Serratia resistant to cefazolin and  nitrofurantoin but otherwise sensitive.  She has been placed on Levaquin, continue for few more days. Urinary symptoms resolved.    Active problems Acute hypoxic respiratory failure, probable acute on chronic diastolic CHF versus atypical pneumonia-on admission patient became more hypoxic and underwent a CT angiogram on 8/15, it was negative for PE but it did show moderate bilateral pleural effusions and upper lobe infiltrates, suspicious for pulmonary edema versus atypical infection.  Patient was diuresed with IV Lasix which has been held with slight lateral rise in her creatinine and currently euvolemic.  She was also placed on Levaquin.  Clinically has improved, able to ambulate in the hallway without any further difficulties, however still requiring 2 L of oxygen on discharge.  She is to complete Levaquin as an outpatient.  Due to wheezing she was also placed on prednisone, she will have quick taper.  She has remote history of tobacco use, she would benefit from PFTs as an outpatient once acute illness resolves. A 2D echo showed normal EF 60-65%, no WMA, normal RV.  There is moderately elevated PA pressure Mild dementia-slightly confused but appropriate for most part.  Outpatient follow-up Essential hypertension-continue Cardizem PAF-continue Cardizem, amiodarone, digoxin.  Not on anticoagulation because of risk of falls. Hypothyroidism-Continue levothyroxine Anemia of chronic disease-Possibly from chronic illnesses.  Hemoglobin stable. Monitor intermittently Thrombocytosis -Possibly reactive.  Resolved Hyponatremia -Mild.  Overall stable Sick sinus syndrome status post permanent pacemaker placement -Outpatient follow-up with cardiology.   Sepsis ruled out  Discharge Instructions  Allergies as of 01/31/2022       Reactions   Azithromycin Other (  See Comments)   Causes heart rhythm issues   Epinephrine Other (See Comments)   Causes A-Fib   Penicillins Anaphylaxis   Has patient had a PCN  reaction causing immediate rash, facial/tongue/throat swelling, SOB or lightheadedness with hypotension: Yes Has patient had a PCN reaction causing severe rash involving mucus membranes or skin necrosis: No Has patient had a PCN reaction that required hospitalization No Has patient had a PCN reaction occurring within the last 10 years: No If all of the above answers are "NO", then may proceed with Cephalosporin use.   Lidocaine Palpitations   Metoprolol Other (See Comments)   alopecia        Medication List     TAKE these medications    amiodarone 200 MG tablet Commonly known as: PACERONE Take 1 tablet (200 mg total) by mouth daily.   Cartia XT 120 MG 24 hr capsule Generic drug: diltiazem Take 1 capsule (120 mg total) by mouth at bedtime. Keep upcoming appointment for future refills.   clobetasol 0.05 % external solution Commonly known as: TEMOVATE Apply 1 application topically 2  times daily to affected area(s) as needed, Do not apply to face,groin or armpit What changed:  when to take this reasons to take this   Digoxin 62.5 MCG Tabs Take 1 tablet by mouth every other day. What changed: how much to take   escitalopram 20 MG tablet Commonly known as: LEXAPRO Take 1 tablet (20 mg total) by mouth daily.   guaiFENesin-dextromethorphan 100-10 MG/5ML syrup Commonly known as: ROBITUSSIN DM Take 10 mLs by mouth every 4 (four) hours as needed for cough.   HYDROcodone-acetaminophen 5-325 MG tablet Commonly known as: NORCO/VICODIN Take 1 tablet by mouth every 6 (six) hours as needed. What changed:  when to take this Another medication with the same name was removed. Continue taking this medication, and follow the directions you see here.   hydroxychloroquine 200 MG tablet Commonly known as: PLAQUENIL Take 1.5 tablets (300 mg total) by mouth daily with food or milk What changed:  how much to take when to take this   levofloxacin 750 MG tablet Commonly known as:  Levaquin Take 1 tablet (750 mg total) by mouth every other day for 2 doses.   levothyroxine 112 MCG tablet Commonly known as: SYNTHROID Take 1 tablet (112 mcg total) by mouth daily. What changed: when to take this   nitrofurantoin 50 MG capsule Commonly known as: MACRODANTIN Take 1 capsule (50 mg total) by mouth daily.   predniSONE 10 MG tablet Commonly known as: DELTASONE Take 4 tablets (40 mg total) by mouth daily for 3 days.   traZODone 150 MG tablet Commonly known as: DESYREL Take 0.5 tablets (75 mg total) by mouth once a day in the evening. What changed: when to take this   triamcinolone cream 0.1 % Commonly known as: KENALOG Apply 1 application to affected area(s) up to 2 (two) times daily as needed. Do not apply to face, groin, or armpit. What changed: reasons to take this               Durable Medical Equipment  (From admission, onward)           Start     Ordered   01/30/22 1610  For home use only DME oxygen  Once       Question Answer Comment  Length of Need 6 Months   Mode or (Route) Nasal cannula   Liters per Minute 3   Frequency Continuous (stationary  and portable oxygen unit needed)   Oxygen delivery system Gas      01/30/22 1609   01/27/22 1050  For home use only DME oxygen  Once       Question Answer Comment  Length of Need 6 Months   Oxygen delivery system Gas      01/27/22 1050            Follow-up Information     [facility], [facility] Follow up.   Specialty: Home Health Services Why: [facility] is to provide home health PT Contact information: [address] [place] [address] [phone]                 Consultations: none  Procedures/Studies:  CT Angio Chest Pulmonary Embolism (PE) W or WO Contrast  Result Date: 01/28/2022 CLINICAL DATA:  Shortness of breath, weakness and hypoxia. EXAM: CT ANGIOGRAPHY CHEST WITH CONTRAST TECHNIQUE: Multidetector CT imaging of the chest was performed using the  standard protocol during bolus administration of intravenous contrast. Multiplanar CT image reconstructions and MIPs were obtained to evaluate the vascular anatomy. RADIATION DOSE REDUCTION: This exam was performed according to the departmental dose-optimization program which includes automated exposure control, adjustment of the mA and/or kV according to patient size and/or use of iterative reconstruction technique. CONTRAST:  61mL OMNIPAQUE IOHEXOL 350 MG/ML SOLN COMPARISON:  Chest x-ray on 01/25/2022 FINDINGS: Cardiovascular: Pulmonary arteries are well opacified. There is no evidence of pulmonary embolism. Central pulmonary arteries are mildly dilated with the main pulmonary artery measuring up to 3.2 cm. The heart size is normal. No pericardial fluid identified. There is some scattered calcified coronary artery plaque. Atherosclerosis of the thoracic aorta present without evidence aneurysmal disease or dissection. Dual-chamber pacemaker present with appropriate lead positioning at the level of the right atrial appendage and right ventricular apex. Mediastinum/Nodes: No enlarged mediastinal, hilar, or axillary lymph nodes. Thyroid gland, trachea, and esophagus demonstrate no significant findings. Lungs/Pleura: Small/nearly moderate volume bilateral pleural effusions of nearly equal volume. Lungs demonstrate diffuse interstitial and ground-glass opacities in both lungs, especially in the upper lobes. Given the presence of pleural effusions, pulmonary edema is suspected. Atypical infection may also be present. No pneumothorax or discrete masses identified. Upper Abdomen: No acute abnormality. Musculoskeletal: No chest wall abnormality. No acute or significant osseous findings. Review of the MIP images confirms the above findings. IMPRESSION: 1. No evidence of pulmonary embolism. 2. Small/nearly moderate volume bilateral pleural effusions. 3. Diffuse interstitial and ground-glass opacities in both lungs, especially  in the upper lobes. Pulmonary edema is suspected. Atypical infectious etiology cannot be excluded. 4. Mild central pulmonary artery dilatation consistent with some degree of underlying pulmonary hypertension. 5. Coronary and aortic atherosclerosis. Aortic Atherosclerosis (ICD10-I70.0). Electronically Signed   By: Irish Lack M.D.   On: 01/28/2022 11:31   DG Chest 2 View  Result Date: 01/25/2022 CLINICAL DATA:  Shortness of breath. EXAM: CHEST - 2 VIEW COMPARISON:  Chest x-ray 08/04/2020 FINDINGS: Left-sided pacemaker is unchanged. External artifact overlies the chest. The heart size and mediastinal contours are within normal limits. Both lungs are clear. The visualized skeletal structures are unremarkable. IMPRESSION: No active cardiopulmonary disease. Electronically Signed   By: Darliss Cheney M.D.   On: 01/25/2022 15:43   Color Fundus Photography Optos - OU - Both Eyes  Result Date: 01/09/2022 Right Eye Progression has been stable. Disc findings include normal observations. Macula : drusen, geographic atrophy. Vessels : normal observations. Left Eye Progression has been stable. Disc findings  include normal observations. Macula : drusen, geographic atrophy. Vessels : normal observations. Periphery : normal observations.   OCT, Retina - OU - Both Eyes  Result Date: 01/09/2022 Right Eye Quality was good. Scan locations included subfoveal. Central Foveal Thickness: 226. Progression has been stable. Findings include retinal drusen , central retinal atrophy, inner retinal atrophy, outer retinal atrophy. Left Eye Quality was good. Scan locations included subfoveal. Central Foveal Thickness: 201. Progression has been stable. Findings include retinal drusen , central retinal atrophy, inner retinal atrophy, outer retinal atrophy. Notes No sign of active CNVM in either eye. Subfoveal atrophy OS from geographic atrophy seen clinically accounts for acuity  CUP PACEART REMOTE DEVICE CHECK  Result Date:  01/07/2022 Scheduled remote reviewed. Normal device function.  AF burden 7.7%, longest <12hrs per cardiac compass trend. Rates varied with some RVR. Rates >100bpm approx 55%.  2 SVT/NSVT, all EGM's show AF/AFL with RVR some short 1:1 noted, rates 160's. Anticoagulation discontinued due to gait instability and frequent falls. On Amiodarone Next remote 91 days- JJB   Subjective: - no chest pain, shortness of breath, no abdominal pain, nausea or vomiting.   Discharge Exam: BP (!) 130/57 (BP Location: Right Arm)   Pulse 66   Temp 97.8 F (36.6 C) (Oral)   Resp 20   Ht  (1.6 m)   Wt 53.7 kg   SpO2 97%   BMI 20.97 kg/m   General: Pt is alert, awake, not in acute distress Cardiovascular: RRR, S1/S2 +, no rubs, no gallops Respiratory: CTA bilaterally, no wheezing, no rhonchi Abdominal: Soft, NT, ND, bowel sounds + Extremities: no edema, no cyanosis  The results of significant diagnostics from this hospitalization (including imaging, microbiology, ancillary and laboratory) are listed below for reference.     Microbiology: Recent Results (from the past 240 hour(s))  SARS Coronavirus 2 by RT PCR (hospital order, performed in [facility] hospital lab) *cepheid single result test* Anterior Nasal Swab     Status: None   Collection Time: 01/25/22  1:48 PM   Specimen: Anterior Nasal Swab  Result Value Ref Range Status   SARS Coronavirus 2 by RT PCR NEGATIVE NEGATIVE Final    Comment: (NOTE) SARS-CoV-2 target nucleic acids are NOT DETECTED.  The SARS-CoV-2 RNA is generally detectable in upper and lower respiratory specimens during the acute phase of infection. The lowest concentration of SARS-CoV-2 viral copies this assay can detect is 250 copies / mL. A negative result does not preclude SARS-CoV-2 infection and should not be used as the sole basis for treatment or other patient management decisions.  A negative result may occur with improper specimen collection / handling, submission  of specimen other than nasopharyngeal swab, presence of viral mutation(s) within the areas targeted by this assay, and inadequate number of viral copies (<250 copies / mL). A negative result must be combined with clinical observations, patient history, and epidemiological information.  Fact Sheet for Patients:   RoadLapTop.co.za  Fact Sheet for Healthcare Providers: http://kim-miller.com/  This test is not yet approved or  cleared by the [place] FDA and has been authorized for detection and/or diagnosis of SARS-CoV-2 by FDA under an Emergency Use Authorization (EUA).  This EUA will remain in effect (meaning this test can be used) for the duration of the COVID-19 declaration under Section 564(b)(1) of the Act, 21 U.S.C. section 360bbb-3(b)(1), unless the authorization is terminated or revoked sooner.  Performed at [facility], [address], [place], [place] [address]   Blood  culture (routine single)     Status: None   Collection Time: 01/25/22  2:50 PM   Specimen: BLOOD  Result Value Ref Range Status   Specimen Description   Final    BLOOD RIGHT ANTECUBITAL Performed at [facility], [address], [place], [place] [address]    Special Requests   Final    BOTTLES DRAWN AEROBIC AND ANAEROBIC Blood Culture adequate volume Performed at [facility], [address], [place], [place] [address]    Culture   Final    NO GROWTH 5 DAYS Performed at [facility], [address], [place], [place] [address]    Report Status 01/30/2022 FINAL  Final  Culture, blood (single)     Status: None   Collection Time: 01/25/22  4:29 PM   Specimen: BLOOD  Result Value Ref Range Status   Specimen Description   Final    BLOOD RIGHT ANTECUBITAL Performed at [facility], [address], [place], [place] [address]    Special Requests   Final    BOTTLES DRAWN  AEROBIC AND ANAEROBIC Blood Culture results may not be optimal due to an inadequate volume of blood received in culture bottles Performed at [facility], [address] [place], [place] [address]    Culture   Final    NO GROWTH 5 DAYS Performed at [facility], [address] [place], [place] [address]    Report Status 01/30/2022 FINAL  Final  Urine Culture     Status: Abnormal   Collection Time: 01/25/22 11:29 PM   Specimen: Urine, Clean Catch  Result Value Ref Range Status   Specimen Description   Final    URINE, CLEAN CATCH Performed at [facility], [address], [place], [place] [address]    Special Requests   Final    NONE Performed at [facility], [address] [place], [place] [address]    Culture >=100,000 COLONIES/mL SERRATIA MARCESCENS (A)  Final   Report Status 01/28/2022 FINAL  Final   Organism ID, Bacteria SERRATIA MARCESCENS (A)  Final      Susceptibility   Serratia marcescens - MIC*    CEFAZOLIN >=64 RESISTANT Resistant     CEFEPIME <=0.12 SENSITIVE Sensitive     CEFTRIAXONE <=0.25 SENSITIVE Sensitive     CIPROFLOXACIN <=0.25 SENSITIVE Sensitive     GENTAMICIN <=1 SENSITIVE Sensitive     NITROFURANTOIN >=512 RESISTANT Resistant     TRIMETH/SULFA <=20 SENSITIVE Sensitive     * >=100,000 COLONIES/mL SERRATIA MARCESCENS     Labs: Basic Metabolic Panel: Recent Labs  Lab 01/26/22 0553 01/27/22 0504 01/28/22 0640 01/29/22 0802 01/30/22 0621 01/31/22 0528  NA 132* 130* 133* 132* 132* 135  K 4.3 4.1 4.7 4.1 3.5 4.5  CL 102 98 103 98 97* 98  CO2 GLUCOSE 88 103* 133* 139* 122* 111*  BUN 28*  CREATININE 0.83 0.89 0.70 0.90 1.04* 1.11*  CALCIUM 8.6* 8.1* 8.8* 8.9 8.7* 8.9  MG 2.0 1.9 2.3 2.1  --   --    Liver Function Tests: Recent Labs  Lab 01/25/22 1454 01/26/22 0553  AST 21 18  ALT 10 9  ALKPHOS 56 45  BILITOT 0.5 0.4  PROT 7.6 6.2*  ALBUMIN  3.3* 2.7*   CBC: Recent Labs  Lab 01/25/22 1454 01/26/22 0553 01/29/22 0802  WBC 8.3 7.0 9.2  NEUTROABS 6.4  --  8.3*  HGB 11.2* 12.8 10.1*  HCT 34.6* 42.0 31.4*  MCV 87.6 92.1 88.0  PLT 413* 295 373   CBG: No results for input(s): "GLUCAP" in the last 168 hours. Hgb A1c No results for input(s): "HGBA1C" in the last 72 hours. Lipid Profile No results for input(s): "CHOL", "HDL", "LDLCALC", "TRIG", "CHOLHDL", "LDLDIRECT" in the last 72 hours. Thyroid function studies No results for input(s): "TSH", "T4TOTAL", "T3FREE", "THYROIDAB" in the last 72 hours.  Invalid input(s): "FREET3" Urinalysis    Component Value Date/Time   COLORURINE YELLOW 01/25/2022 1501   APPEARANCEUR HAZY (A) 01/25/2022 1501   LABSPEC 1.018 01/25/2022 1501   PHURINE 5.0 01/25/2022 1501   GLUCOSEU NEGATIVE 01/25/2022 1501   HGBUR NEGATIVE 01/25/2022 1501   BILIRUBINUR NEGATIVE 01/25/2022 1501   KETONESUR NEGATIVE 01/25/2022 1501   PROTEINUR 30 (A) 01/25/2022 1501   UROBILINOGEN 0.2 04/24/2015 1423   NITRITE POSITIVE (A) 01/25/2022 1501   LEUKOCYTESUR MODERATE (A) 01/25/2022 1501    FURTHER DISCHARGE INSTRUCTIONS:   Get Medicines reviewed and adjusted: Please take all your medications with you for your next visit with your Primary MD   Laboratory/radiological data: Please request your Primary MD to go over all hospital tests and procedure/radiological results at the follow up, please ask your Primary MD to get all Hospital records sent to his/her office.   In some cases, they will be blood work, cultures and biopsy results pending at the time of your discharge. Please request that your primary care M.D. goes through all the records of your hospital data and follows up on these results.   Also Note the following: If you experience worsening of your admission symptoms, develop shortness of breath, life threatening emergency, suicidal or homicidal thoughts you must seek medical attention immediately  by calling 911 or calling your MD immediately  if symptoms less severe.   You must read complete instructions/literature along with all the possible adverse reactions/side effects for all the Medicines you take and that have been prescribed to you. Take any new Medicines after you have completely understood and accpet all the possible adverse reactions/side effects.    Do not drive when taking Pain medications or sleeping medications (Benzodaizepines)   Do not take more than prescribed Pain, Sleep and Anxiety Medications. It is not advisable to combine anxiety,sleep and pain medications without talking with your primary care practitioner   Special Instructions: If you have smoked or chewed Tobacco  in the last 2 yrs please stop smoking, stop any regular Alcohol  and or any Recreational drug use.   Wear Seat belts while driving.   Please note: You were cared for by a hospitalist during your hospital stay. Once you are discharged, your primary care physician will handle any further medical issues. Please note that NO REFILLS for any discharge medications will be authorized once you are discharged, as it is imperative that you return to your primary care physician (or establish a relationship with a primary care physician if you do not have one) for your post hospital discharge needs so that they can reassess your need for medications and monitor your lab values.  Time coordinating discharge: 40 minutes  SIGNED:  Pamella Pert, MD, PhD 01/31/2022, 3:14 PM

## 2022-01-31 NOTE — Progress Notes (Signed)
Mobility Specialist - Progress Note   01/31/22 1448  Mobility  HOB Elevated/Bed Position Self regulated  Activity Ambulated with assistance in hallway  Range of Motion/Exercises Active  Level of Assistance Contact guard assist, steadying assist  Assistive Device Front wheel walker  Distance Ambulated (ft) 50 ft  Activity Response Tolerated well  Transport method Ambulatory  $Mobility charge 1 Mobility   Pt received in bed and agreeable to mobility. C/o exhaustion post ambulation. Pt to bed after session with all needs met.     [facility]

## 2022-01-31 NOTE — Progress Notes (Signed)
Mobility Specialist Cancellation/Refusal Note:   Reason for Cancellation/Refusal: Pt declined mobility at this time. Pt w/ respiratory & eating lunch.  Will check back as schedule permits.   [facility]

## 2022-01-31 NOTE — Progress Notes (Signed)
  Echocardiogram 2D Echocardiogram has been performed.  Jacqueline Kim 01/31/2022, 3:41 PM

## 2022-02-03 NOTE — Progress Notes (Signed)
Remote pacemaker transmission.   

## 2022-02-04 ENCOUNTER — Encounter: Payer: Medicare Other | Admitting: Psychology

## 2022-02-06 ENCOUNTER — Encounter: Payer: Self-pay | Admitting: Cardiovascular Disease

## 2022-02-06 DIAGNOSIS — Z79899 Other long term (current) drug therapy: Secondary | ICD-10-CM

## 2022-02-06 NOTE — Telephone Encounter (Signed)
Please have her stop amiodarone and recommend pulmonary consultation. Check ESR and CRP.

## 2022-02-07 LAB — C-REACTIVE PROTEIN: CRP: 4 mg/L (ref 0–10)

## 2022-02-07 LAB — SEDIMENTATION RATE: Sed Rate: 13 mm/hr (ref 0–40)

## 2022-02-07 NOTE — Telephone Encounter (Signed)
Please contact [employer] and order for in-line humidifier for her O2.

## 2022-02-10 ENCOUNTER — Encounter: Payer: Medicare Other | Attending: Psychology | Admitting: Psychology

## 2022-02-10 DIAGNOSIS — I495 Sick sinus syndrome: Secondary | ICD-10-CM | POA: Diagnosis not present

## 2022-02-10 DIAGNOSIS — G4733 Obstructive sleep apnea (adult) (pediatric): Secondary | ICD-10-CM | POA: Diagnosis not present

## 2022-02-10 DIAGNOSIS — Z79899 Other long term (current) drug therapy: Secondary | ICD-10-CM | POA: Diagnosis not present

## 2022-02-10 DIAGNOSIS — I480 Paroxysmal atrial fibrillation: Secondary | ICD-10-CM | POA: Insufficient documentation

## 2022-02-10 DIAGNOSIS — I679 Cerebrovascular disease, unspecified: Secondary | ICD-10-CM

## 2022-02-10 DIAGNOSIS — Z950 Presence of cardiac pacemaker: Secondary | ICD-10-CM | POA: Insufficient documentation

## 2022-02-10 DIAGNOSIS — F0280 Dementia in other diseases classified elsewhere without behavioral disturbance: Secondary | ICD-10-CM

## 2022-02-10 DIAGNOSIS — H353133 Nonexudative age-related macular degeneration, bilateral, advanced atrophic without subfoveal involvement: Secondary | ICD-10-CM | POA: Diagnosis not present

## 2022-02-10 DIAGNOSIS — I10 Essential (primary) hypertension: Secondary | ICD-10-CM | POA: Insufficient documentation

## 2022-02-10 DIAGNOSIS — Z5181 Encounter for therapeutic drug level monitoring: Secondary | ICD-10-CM | POA: Insufficient documentation

## 2022-02-11 ENCOUNTER — Other Ambulatory Visit: Payer: Self-pay | Admitting: Psychology

## 2022-02-12 ENCOUNTER — Other Ambulatory Visit (HOSPITAL_COMMUNITY): Payer: Self-pay

## 2022-02-24 ENCOUNTER — Other Ambulatory Visit (HOSPITAL_COMMUNITY): Payer: Self-pay

## 2022-02-24 MED ORDER — LEVOTHYROXINE SODIUM 112 MCG PO TABS
112.0000 ug | ORAL_TABLET | Freq: Every day | ORAL | 3 refills | Status: DC
  Filled 2022-02-24: qty 30, 30d supply, fill #0
  Filled 2022-03-26: qty 30, 30d supply, fill #1
  Filled 2022-04-22: qty 30, 30d supply, fill #2
  Filled 2022-05-22: qty 30, 30d supply, fill #3

## 2022-02-24 MED ORDER — ESCITALOPRAM OXALATE 20 MG PO TABS
20.0000 mg | ORAL_TABLET | Freq: Every day | ORAL | 0 refills | Status: DC
  Filled 2022-02-24: qty 90, 90d supply, fill #0

## 2022-02-25 ENCOUNTER — Ambulatory Visit: Payer: Medicare Other | Admitting: Psychology

## 2022-03-03 ENCOUNTER — Ambulatory Visit (HOSPITAL_BASED_OUTPATIENT_CLINIC_OR_DEPARTMENT_OTHER)
Admission: RE | Admit: 2022-03-03 | Discharge: 2022-03-03 | Disposition: A | Discharge status: Home / Self Care | Payer: Medicare Other | Source: Ambulatory Visit | Attending: Internal Medicine | Admitting: Internal Medicine

## 2022-03-03 ENCOUNTER — Other Ambulatory Visit (HOSPITAL_BASED_OUTPATIENT_CLINIC_OR_DEPARTMENT_OTHER): Payer: Self-pay | Admitting: Internal Medicine

## 2022-03-03 ENCOUNTER — Other Ambulatory Visit: Payer: Self-pay | Admitting: Internal Medicine

## 2022-03-03 ENCOUNTER — Other Ambulatory Visit (HOSPITAL_COMMUNITY): Payer: Self-pay

## 2022-03-03 DIAGNOSIS — R1032 Left lower quadrant pain: Secondary | ICD-10-CM | POA: Insufficient documentation

## 2022-03-03 MED ORDER — HYDROCODONE-ACETAMINOPHEN 7.5-325 MG PO TABS
1.0000 | ORAL_TABLET | Freq: Four times a day (QID) | ORAL | 0 refills | Status: DC | PRN
  Filled 2022-03-03: qty 46, 12d supply, fill #0
  Filled 2022-03-03: qty 74, 18d supply, fill #0

## 2022-03-03 MED ORDER — HYDROCODONE-ACETAMINOPHEN 5-325 MG PO TABS
1.0000 | ORAL_TABLET | Freq: Four times a day (QID) | ORAL | 0 refills | Status: DC
  Filled 2022-03-03: qty 120, 30d supply, fill #0

## 2022-03-04 ENCOUNTER — Other Ambulatory Visit (HOSPITAL_COMMUNITY): Payer: Self-pay

## 2022-03-04 ENCOUNTER — Encounter: Payer: Self-pay | Admitting: Pulmonary Disease

## 2022-03-04 ENCOUNTER — Ambulatory Visit (INDEPENDENT_AMBULATORY_CARE_PROVIDER_SITE_OTHER): Payer: Medicare Other | Admitting: Pulmonary Disease

## 2022-03-04 VITALS — BP 122/70 | HR 81 | Ht 63.0 in | Wt 112.8 lb

## 2022-03-04 DIAGNOSIS — R0902 Hypoxemia: Secondary | ICD-10-CM | POA: Diagnosis not present

## 2022-03-04 DIAGNOSIS — R918 Other nonspecific abnormal finding of lung field: Secondary | ICD-10-CM | POA: Diagnosis not present

## 2022-03-04 MED ORDER — PREDNISONE 10 MG PO TABS
ORAL_TABLET | ORAL | 0 refills | Status: DC
  Filled 2022-03-04: qty 70, 28d supply, fill #0

## 2022-03-04 MED ORDER — SULFAMETHOXAZOLE-TRIMETHOPRIM 400-80 MG PO TABS
1.0000 | ORAL_TABLET | ORAL | 0 refills | Status: DC
Start: 2022-03-05 — End: 2022-06-24
  Filled 2022-03-04: qty 30, 70d supply, fill #0

## 2022-03-04 NOTE — Patient Instructions (Signed)
Nice to meet you  I think based on the imaging some of your symptoms are related to congestive heart failure.  Given some of the changes that are different compared to 2020, I have prescribed prednisone to treat possible amiodarone toxicity.  Is hard to know if this diagnosis is correct but I think it is worth trying for a few weeks to see if your symptoms improved.  Take prednisone 30 mg daily for 2 weeks then decrease to 20 mg daily for 2 weeks.  I prescribed the first 4 weeks of therapy.  I would like to see you back in 3 weeks to assess your response and decide ongoing taper of prednisone, the speed of which will depend on your response to treatment.  To try to prevent a complication of a fungal infection that can occur with long-term prednisone use, take Bactrim 1 pill once a day on Monday Wednesday and Friday, 3 times a week.  Do not take it every day.  Return to clinic in 3 weeks or sooner if needed with Dr. Silas Flood

## 2022-03-04 NOTE — Progress Notes (Signed)
02/10/2022 4 PM-5 PM:  Today I provided feedback regarding the results of the recent repeat neuropsychological evaluation.  Below I have included a copy of the summary of that formal neuropsychological evaluation that can be found in its entirety dated 01/01/2022.  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  There did not appear to be any progression over the past year and diagnostically it does appear to be primarily of a vascular nature rather than a progressive cortical dementia.    .Impression/Diagnosis:                     Overall, the results of the current neuropsychological evaluation are generally encouraging.  The patient is now completely stopped all alcohol consumption and is monitoring and with weight loss may be having less issues her obstructive sleep apnea.  There is also been an improvement in her overall medical wellness with better management of her metabolic status with most recent lab works all within normal limits for the most part.  There does not appear to be any significant progression in general although she does show worsening with regard to information processing speed, free recall of recently experienced information with continued profound deficits for visual memory and moderate deficits for auditory memory.  The patient continues to show multiple domains of cognitive deficits including memory, information processing speed, visual-spatial and visual constructional capacities with generally well-preserved expressive language.  The patient and her daughter continue to deny any significant geographic disorientation, changes in motor functioning, or any visual or auditory hallucinations.  There is also no significant behavioral disturbance.  As far as  diagnostic considerations the patient continues to meet the criterion for a major neurocognitive disorder/dementia with multiple domains of significant cognitive changes and impairments.  There does not appear to be any progressive change over the past year for the most part although this stability and interpretation of the stability over time is impacted by the significant improvement in her overall health and the patient completely stopping alcohol which was likely playing a role to some degree in her overall deficits.  She has not been having significant medical illness and UTIs have been less of an issue recently.  While this stability would argue against the patient's cognitive deficits being primarily related to an Alzheimer's type condition this still cannot be ruled out but the likelihood of her cognitive deficits to be more of a cerebrovascular issue particularly microvascular ischemic disease/small vessel disease is more likely an prominent with this repeat testing results.  The patient has not been able to have an MRI leaving [place] with CT exams as our only neuro imaging pathway.  I do, however, temper my initial assessment attributing the observed neurocognitive deficits as being most likely attributed to an Alzheimer's type cortical progressive pathology and at this point these cognitive deficits may be more related to cerebrovascular microvascular ischemic changes.  As far as treatment recommendations it is important for the patient to continue with good health practices around diet, exercise, and good sleep hygiene.  The patient should remain very diligent as far as managing her diagnosis of obstructive sleep apnea.  The patient did not initiate CPAP use and after weight loss was reported to have less observed symptoms consistent with obstructive sleep apnea.  However, leaving a condition  like sleep apnea untreated could have a significant impact if microvascular ischemic changes/small vessel disease  are the primary culprit.  I will encourage the patient to consider having a new sleep study to make sure we are not missing something that is highly treatable.   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 Clinical Neuropsychologist

## 2022-03-05 NOTE — Progress Notes (Signed)
@Patient  ID: Jacqueline Kim, female    DOB: [birth year], 76 y.o.   MRN: [id number]  Chief Complaint  Patient presents with   Consult    Pt is a consult for possible lung toxicity. Pt states that she get winded when she is up walking around. She is on 2L of oxygen. Pt states that she does get SOB, cough and some wheezingnoted. Pt states no active inhalers     Referring provider: Croitoru, Dani Gobble, MD  HPI:   76 y.o. whom are seen in consultation for evaluation of abnormal lung imaging and hypoxemic respiratory failure.  Most recent cardiology note reviewed.  Most recent discharge summary reviewed.  Patient was in usual state of health.  About 3 months ago was placed on amiodarone for atrial fibrillation.  Within the last month to 6-week she developed worsening shortness of breath and cough.  Seems cough made was the first symptom.  Eventually was hospitalized 01/2022.  Found to be hypoxemic on room air.  CT scan of the chest with contrast on my review interpretation reveals no evidence of PE, moderate to large bilateral pleural effusions, scattered and diffuse interstitial thickening, interlobular septal thickening, as well as scattered groundglass opacities most consistent with volume overload.  In the hospital, she was treated for pneumonia as well as pulmonary edema.  She received Lasix, antibiotics.  She also was sent out with a steroid taper.  The flow rate of oxygen was decreased and she has been on 2 L since.  Per daughters in the room, they are not sure steroids really helped.  Does not feel like there is marked improvement with steroid therapy.  She has good days and bad days.  Some days her oxygen stays up, some days she has worsening desaturation.  Today seems like relative good day.  In her oxygen saturation today when walking to the room was higher and it was yesterday at a doctor's visit.  Patient describes cough has persisted although hard to tell if better or worse.  Her dyspnea is  largely unchanged despite interventions.  She does not take Lasix daily.  Sounds like they describe orthostatic hypotension, dizziness lightheadedness when rising from seated position.  In addition recently in the hospital she had development of mild AKI in the setting of diuretic therapy.  Only using Lasix as needed.  Not using very much.  After discharge from the hospital, her EP doctor was contacted.  He recommended stopping amiodarone.  Recommend pulmonary consultation was prompted today's visit.  Labs were obtained by him which show normal CRP and sedimentation rate.  PMH: Congestive heart failure, mild cognitive impairment, hypothyroidism, chronic UTIs Surgical history: Breast surgery, hand surgery, foot surgery, tonsillectomy, knee replacement, hysterectomy Family history: No significant rest or illness in first relatives  Questionaires / Pulmonary Flowsheets:   ACT:      No data to display          MMRC:     No data to display          Epworth:      No data to display          Tests:   FENO:  No results found for: "NITRICOXIDE"  PFT:     No data to display          WALK:      No data to display          Imaging: Personally reviewed and as per EMR discussion in this note CT ABDOMEN  PELVIS WO CONTRAST  Result Date: 03/03/2022 CLINICAL DATA:  Left lower quadrant abdominal pain EXAM: CT ABDOMEN AND PELVIS WITHOUT CONTRAST TECHNIQUE: Multidetector CT imaging of the abdomen and pelvis was performed following the standard protocol without IV contrast. RADIATION DOSE REDUCTION: This exam was performed according to the departmental dose-optimization program which includes automated exposure control, adjustment of the mA and/or kV according to patient size and/or use of iterative reconstruction technique. COMPARISON:  CT 03/05/2020 FINDINGS: Lower chest: Trace bilateral pleural effusions with interlobular septal thickening throughout the imaged lung bases.  Hepatobiliary: Unremarkable unenhanced appearance of the liver. No focal liver lesion identified. Gallbladder within normal limits. No hyperdense gallstone. No biliary dilatation. Pancreas: Unremarkable. No pancreatic ductal dilatation or surrounding inflammatory changes. Spleen: Normal in size without focal abnormality. Adrenals/Urinary Tract: Adrenal glands are unremarkable. Kidneys are normal, without renal calculi, focal lesion, or hydronephrosis. Bladder is unremarkable for the degree of distension. Stomach/Bowel: Stomach is within normal limits. Appendix not visualized. Minimal scattered diverticulosis. No evidence of bowel wall thickening, distention, or inflammatory changes. Vascular/Lymphatic: Aortic atherosclerosis. No enlarged abdominal or pelvic lymph nodes. Reproductive: Status post hysterectomy. No adnexal masses. Other: No free fluid. No abdominopelvic fluid collection. No pneumoperitoneum. No abdominal wall hernia. Musculoskeletal: Dextroscoliotic curvature of the lumbar spine with multilevel degenerative disc disease. No acute bony findings. IMPRESSION: 1. No acute abdominopelvic findings. 2. Minimal scattered diverticulosis without evidence of acute diverticulitis. 3. Trace bilateral pleural effusions with interlobular septal thickening throughout the imaged lung bases, suggestive of pulmonary edema. 4. Aortic Atherosclerosis (ICD10-I70.0). Electronically Signed   By: Davina Poke D.O.   On: 03/03/2022 13:13    Lab Results: Personally reviewed CBC    Component Value Date/Time   WBC 9.2 01/29/2022 0802   RBC 3.57 (L) 01/29/2022 0802   HGB 10.1 (L) 01/29/2022 0802   HGB 12.3 09/30/2021 1335   HCT 31.4 (L) 01/29/2022 0802   HCT 36.1 09/30/2021 1335   PLT 373 01/29/2022 0802   PLT 274 09/30/2021 1335   MCV 88.0 01/29/2022 0802   MCV 92 09/30/2021 1335   MCH 28.3 01/29/2022 0802   MCHC 32.2 01/29/2022 0802   RDW 13.0 01/29/2022 0802   RDW 12.0 09/30/2021 1335   LYMPHSABS 0.5 (L)  01/29/2022 0802   MONOABS 0.3 01/29/2022 0802   EOSABS 0.0 01/29/2022 0802   BASOSABS 0.0 01/29/2022 0802    BMET    Component Value Date/Time   NA 135 01/31/2022 0528   K 4.5 01/31/2022 0528   CL 98 01/31/2022 0528   CO2 30 01/31/2022 0528   GLUCOSE 111 (H) 01/31/2022 0528   BUN 28 (H) 01/31/2022 0528   CREATININE 1.11 (H) 01/31/2022 0528   CREATININE 1.05 02/23/2013 1550   CALCIUM 8.9 01/31/2022 0528   GFRNONAA 51 (L) 01/31/2022 0528   GFRAA >60 03/05/2020 1620    BNP    Component Value Date/Time   BNP 16.4 01/25/2022 1630    ProBNP    Component Value Date/Time   PROBNP 1,110.0 (H) 09/18/2013 0332    Specialty Problems       Pulmonary Problems   Obstructive sleep apnea of adult    Patient had diagnosis some 2 years or so ago and was not able to comply with use of CPAP.  Since this time however she has lost weight and by family information she does make fewer "noises of snoring" during night time  Patient still has review of systems positive for sleep apnea with nocturia at least once a  night if not more often 2-3 times nightly      Hypoxia    Allergies  Allergen Reactions   Azithromycin Other (See Comments)    Causes heart rhythm issues    Epinephrine Other (See Comments)    Causes A-Fib    Penicillins Anaphylaxis    Has patient had a PCN reaction causing immediate rash, facial/tongue/throat swelling, SOB or lightheadedness with hypotension: Yes Has patient had a PCN reaction causing severe rash involving mucus membranes or skin necrosis: No Has patient had a PCN reaction that required hospitalization No Has patient had a PCN reaction occurring within the last 10 years: No If all of the above answers are "NO", then may proceed with Cephalosporin use.    Lidocaine Palpitations   Metoprolol Other (See Comments)    alopecia     Immunization History  Administered Date(s) Administered   Influenza, High Dose Seasonal PF 04/18/2017, 03/11/2018    Influenza,inj,quad, With Preservative 04/15/2017   Influenza-Unspecified 03/08/2013, 04/15/2017, 03/11/2018   PFIZER(Purple Top)SARS-COV-2 Vaccination 07/01/2019, 07/22/2019   Pneumococcal Polysaccharide-23 10/09/2012   Pneumococcal-Unspecified 10/14/2016    Past Medical History:  Diagnosis Date   Anticoagulant long-term use  WITH PRADAXA 05/27/2011   Anxiety    Cellulitis of buttock, left [id number]   Complication of anesthesia    DIFFICULTY WAKING UP   Depression    Diverticulosis    DJD (degenerative joint disease)    Esophagitis 09/17/06   External hemorrhoids    GERD (gastroesophageal reflux disease)    Glaucoma    History of stress test    a. 2008 - normal nuc.   Hypertension    Hypothyroid    IBS (irritable bowel syndrome)    Paroxysmal A-fib ([place]) diagnosed 2008   a. On propafenone, Pradaxa.   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   s/p MDT PPM by Dr Recardo Evangelist    Tobacco History: Social History   Tobacco Use  Smoking Status Former   Types: Cigarettes   Quit date: 08/30/1996   Years since quitting: 25.5  Smokeless Tobacco Never   Counseling Kim: Not Answered   Continue to not smoke  Outpatient Encounter Medications as of 03/04/2022  Medication Sig   clobetasol (TEMOVATE) 0.05 % external solution Apply 1 application topically 2  times daily to affected area(s) as needed, Do not apply to face,groin or armpit (Patient taking differently: Apply 1 application  topically 2 (two) times daily as needed (to affected areas of the scalp- avoid face/groin/armpits).)   Digoxin 62.5 MCG TABS Take 1 tablet by mouth every other day. (Patient taking differently: Take 62.5 mcg by mouth every other day.)   diltiazem (CARTIA XT) 120 MG 24 hr capsule Take 1 capsule (120 mg total) by mouth at bedtime. Keep upcoming appointment for future refills.   escitalopram (LEXAPRO) 20 MG tablet Take 1 tablet (20 mg total)  by mouth daily.   HYDROcodone-acetaminophen (NORCO/VICODIN) 5-325 MG tablet Take 1 tablet by mouth every 6 (six) hours as needed. (Patient taking differently: Take 1 tablet by mouth 4 (four) times daily.)   HYDROcodone-acetaminophen (NORCO/VICODIN) 5-325 MG tablet Take 1 tablet by mouth every 6 (six) hours as needed.   hydroxychloroquine (PLAQUENIL) 200 MG tablet Take 1.5 tablets (300 mg total) by mouth daily with food or milk (Patient taking differently: Take 200 mg by mouth in the morning.)   levothyroxine (SYNTHROID) 112 MCG tablet Take 1 tablet (112 mcg total) by mouth daily.  nitrofurantoin (MACRODANTIN) 50 MG capsule Take 1 capsule (50 mg total) by mouth daily.   predniSONE (DELTASONE) 10 MG tablet Take 3 tablets (30 mg total) by mouth daily with breakfast for 14 days, THEN 2 tablets (20 mg total) daily with breakfast for 14 days.   sulfamethoxazole-trimethoprim (BACTRIM) 400-80 MG tablet Take 1 tablet by mouth 3 (three) times a week. Monday, Wednesday, Friday.   traZODone (DESYREL) 150 MG tablet Take 0.5 tablets (75 mg total) by mouth once a day in the evening. (Patient taking differently: Take 75 mg by mouth at bedtime.)   triamcinolone cream (KENALOG) 0.1 % Apply 1 application to affected area(s) up to 2 (two) times daily as needed. Do not apply to face, groin, or armpit. (Patient taking differently: Apply 1 application  topically 2 (two) times daily as needed (to affected areas, for contact dermatitis- avoid facegroin/armpits).)   [DISCONTINUED] guaiFENesin-dextromethorphan (ROBITUSSIN DM) 100-10 MG/5ML syrup Take 10 mLs by mouth every 4 (four) hours as needed for cough.   [DISCONTINUED] HYDROcodone-acetaminophen (NORCO) 7.5-325 MG tablet Take 1 tablet by mouth every 6 (six) hours as needed.   No facility-administered encounter medications on file as of 03/04/2022.     Review of Systems  Review of Systems  No chest pain with exertion.  No orthopnea or PND.  Comprehensive review of  systems otherwise negative. Physical Exam  BP 122/70 (BP Location: Left Arm, Patient Position: Sitting, Cuff Size: Normal)   Pulse 81   Ht 5\' 3"  (1.6 m)   Wt 112 lb 12.8 oz (51.2 kg)   SpO2 97%   BMI 19.98 kg/m   Wt Readings from Last 5 Encounters:  03/04/22 112 lb 12.8 oz (51.2 kg)  01/31/22 118 lb 6.2 oz (53.7 kg)  01/13/22 119 lb 6.4 oz (54.2 kg)  12/31/21 122 lb 9.6 oz (55.6 kg)  10/07/21 123 lb (55.8 kg)    BMI Readings from Last 5 Encounters:  03/04/22 19.98 kg/m  01/31/22 20.97 kg/m  01/13/22 21.15 kg/m  12/31/21 21.72 kg/m  10/07/21 21.11 kg/m     Physical Exam General: Frail, elderly, sitting in chair Eyes: EOMI, no icterus Neck: Supple, no JVP appreciated sitting upright Pulmonary: Diffuse crackles most prominent in the bases, normal work of breathing on 2 L nasal cannula Cardiovascular: No lower extremity edema, warm Abdomen: Nondistended, bowel sounds present MSK: No synovitis, no joint effusion Neuro: Slow gait, no focal weakness Psych: Alert, attentive, normal mood   Assessment & Plan:   Interstitial and alveolar filling/GGO's on CT scan: Unclear etiology.  Certainly a component of volume overload Kim concomitant moderate to large pleural effusions on initial CT scan 01/29/2022.  The presence of relative volume overload makes further radiographic evaluation very challenging to near impossible.  Recent CT scan abdomen pelvis this week demonstrates improved size of pleural effusions, less GGO's, ongoing interstitial infiltrates.  Suspect again largely this is driven by volume overload.  However, compared to CT abdomen pelvis in 2020, with presence of similar sized pleural effusions and lack of interstitial infiltrates, other etiologies are possible.  In addition, what argues against inflammatory infiltrate such as amiodarone toxicity is the lack of elevated inflammatory markers.  It is possible this does represent amiodarone toxicity or some other initial  lung disease but the lack of inflammatory markers makes me think the likelihood of responding to prednisone is relatively low.  After long discussion with patient and daughters in room today, offering course of steroid therapy to target amiodarone toxicity versus ILD NOS.  Start prednisone 30 mg daily (this is over 0.5 mg/kg) for 2 weeks then start tapering.  Reassess in 3 to 4 weeks.  If no improvement with recommend rapid taper.  If there is significant treatment we will plan to taper over the course of 3 to 6 months for presumed amiodarone toxicity.  Agree with discontinuation of amiodarone, do not resume.  Bactrim thrice weekly for PJP prophylaxis.  I still think there is a certain degree of left-sided volume overload contributing to the imaging findings.  Fortunately she shows no signs of RV failure, lower extremity edema etc.  Will contact cardiologist to consider course of diuresis.  This certainly will be challenging in the setting of her reported orthostatic vital signs, chronic kidney disease stage IIIa based on estimated GFR but likely more severe chronic kidney disease Kim her overall lack of muscle mass.   Return in about 3 weeks (around 03/25/2022).   Lanier Clam, MD 03/05/2022

## 2022-03-14 ENCOUNTER — Encounter: Payer: Self-pay | Admitting: Cardiovascular Disease

## 2022-03-14 ENCOUNTER — Other Ambulatory Visit (HOSPITAL_COMMUNITY): Payer: Self-pay

## 2022-03-14 ENCOUNTER — Ambulatory Visit: Payer: Medicare Other | Attending: Cardiovascular Disease | Admitting: Cardiovascular Disease

## 2022-03-14 VITALS — BP 100/60 | HR 95 | Ht 63.0 in | Wt 119.0 lb

## 2022-03-14 DIAGNOSIS — J9601 Acute respiratory failure with hypoxia: Secondary | ICD-10-CM

## 2022-03-14 DIAGNOSIS — Z79899 Other long term (current) drug therapy: Secondary | ICD-10-CM | POA: Diagnosis present

## 2022-03-14 DIAGNOSIS — I480 Paroxysmal atrial fibrillation: Secondary | ICD-10-CM

## 2022-03-14 DIAGNOSIS — Z950 Presence of cardiac pacemaker: Secondary | ICD-10-CM | POA: Diagnosis present

## 2022-03-14 DIAGNOSIS — I10 Essential (primary) hypertension: Secondary | ICD-10-CM

## 2022-03-14 DIAGNOSIS — I495 Sick sinus syndrome: Secondary | ICD-10-CM | POA: Diagnosis not present

## 2022-03-14 DIAGNOSIS — Z5181 Encounter for therapeutic drug level monitoring: Secondary | ICD-10-CM

## 2022-03-14 MED ORDER — DIGOXIN 62.5 MCG PO TABS
1.0000 | ORAL_TABLET | Freq: Every day | ORAL | 1 refills | Status: DC
  Filled 2022-03-14: qty 90, fill #0
  Filled 2022-03-19: qty 30, 30d supply, fill #0

## 2022-03-14 MED ORDER — MIDODRINE HCL 5 MG PO TABS
5.0000 mg | ORAL_TABLET | Freq: Four times a day (QID) | ORAL | 1 refills | Status: DC | PRN
  Filled 2022-03-14: qty 30, 8d supply, fill #0

## 2022-03-14 NOTE — Patient Instructions (Signed)
Medication Instructions:  START Midodrine 5 mg every 6 hours as needed for low blood pressure. Do not take for a systolic (top number) over 120 -Do not take it more than 4 hours before going to bed.   TAKE Digoxin 62.5 mg daily  *If you need a refill on your cardiac medications before your next appointment, please call your pharmacy*   Lab Work: None ordered If you have labs (blood work) drawn today and your tests are completely normal, you will receive your results only by: [place] (if you have MyChart) OR A paper copy in the mail If you have any lab test that is abnormal or we need to change your treatment, we will call you to review the results.   Testing/Procedures: None ordered   Follow-Up: At [facility], you and your health needs are our priority.  As part of our continuing mission to provide you with exceptional heart care, we have created designated Provider Care Teams.  These Care Teams include your primary Cardiologist (physician) and Advanced Practice Providers (APPs -  Physician Assistants and Nurse Practitioners) who all work together to provide you with the care you need, when you need it.  We recommend signing up for the patient portal called "MyChart".  Sign up information is provided on this After Visit Summary.  MyChart is used to connect with patients for Virtual Visits (Telemedicine).  Patients are able to view lab/test results, encounter notes, upcoming appointments, etc.  Non-urgent messages can be sent to your provider as well.   To learn more about what you can do with MyChart, go to [email or web address].    Your next appointment:   3 month(s) on a device day  The format for your next appointment:   In Person  Provider:   [name]  Important Information About Sugar

## 2022-03-14 NOTE — Progress Notes (Signed)
Cardiology Office Note    Date:  03/14/2022   ID:  Jacqueline Kim, DOB [birth year], MRN [id number]  PCP:  Merrilee Seashore, MD  Cardiologist:  Shelva Majestic, MD; Sanda Klein, MD   No chief complaint on file.   History of Present Illness:  Jacqueline Kim is a 76 y.o. female with paroxysmal atrial fibrillation and sinus node dysfunction, here for pacemaker check.  She is accompanied by her daughter.  She recently saw Dr. Claiborne Billings in clinic.  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  After years of good response to treatment with propafenone she developed frequent episodes of atrial fibrillation rapid ventricular response, so we switched to amiodarone in April 2023.  This led to a remarkable reduction in the burden of atrial fibrillation, but unfortunately on January 25, 2022 she was admitted with acute hypoxemic respiratory failure, with chest x-ray findings suggestive of multifocal pneumonia.  She was mildly hyponatremic.  CT angiogram August 15 was negative for PE but showed moderate bilateral pleural effusions and upper lobe infiltrate suspicious for pulmonary edema versus atypical infection.  She was given IV furosemide, but she promptly developed worsening renal function.  Urinalysis showed abnormal findings in urine cultures grew Serratia.  She was treated with antibiotics for this and for suspected atypical pneumonia.  She was given a tapering dose of prednisone, as well as diuretics.  Echo showed normal left ventricular systolic function with EF 60-65% and there was moderately elevated PA pressure.  She failed to improve after discharge and remains on oxygen.  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  We advised her to stop the amiodarone on 02/06/2022.  ESR and CRP were normal.  On 03/03/2022 she had a CT of the abdomen and pelvis, for left lower quadrant abdominal pain.  There were no acute abdominal pelvic  findings but there was evidence of pulmonary abnormalities with interlobular septal thickening throughout the imaged lung bases suggestive of pulmonary edema.  Note that on 01/25/2022 the BNP was only 16.  On 03/04/2022 she saw Dr. Silas Flood in the pulmonary clinic.  He was skeptical about the benefits of corticosteroid since the inflammatory markers were low, but did attempt an empirical treatment with prednisone starting at 30 mg daily for 2 weeks and then tapering down.  She has been on that regimen now for just under 2 weeks.  She is also receiving Bactrim for secondary infection prophylaxis.  She is still on 2 L of oxygen by nasal cannula.  She underwent pacemaker generator change out on October 07, 2021.  The site has healed very nicely.  The device sticks out rather prominently since she has lost so much weight, but the overlying skin appears healthy and there is no inflammatory change.  ***. The patient specifically denies any chest pain at rest exertion, dyspnea at rest or with exertion, orthopnea, paroxysmal nocturnal dyspnea, syncope, palpitations, focal neurological deficits, intermittent claudication, lower extremity edema, unexplained weight gain, cough, hemoptysis or wheezing.  She has not had any falls or bleeding problems.  Device interrogation shows normal function.  All lead parameters are good.  Generator longevity is estimated at 13 years.  Activity is also good at about 3.7 hours/day on the average.  The heart rate histogram distribution is appropriate.  She has 91% atrial pacing and only 1.6% ventricular pacing.  The burden of atrial fibrillation is 7.4% since generator change out, but appears to be decreasing over the last month or so.  Rate control is usually very good, there is one episode of high ventricular rate labeled nonsustained ventricular tachycardia which actually represents atrial fibrillation rapid ventricular response.  She is now on amiodarone and had normal liver  function tests and TSH last April.     Past Medical History:  Diagnosis Date   Anticoagulant long-term use  WITH PRADAXA 05/27/2011   Anxiety    Cellulitis of buttock, left 08/65/7846   Complication of anesthesia    DIFFICULTY WAKING UP   Depression    Diverticulosis    DJD (degenerative joint disease)    Esophagitis 09/17/06   External hemorrhoids    GERD (gastroesophageal reflux disease)    Glaucoma    History of stress test    a. 2008 - normal nuc.   Hypertension    Hypothyroid    IBS (irritable bowel syndrome)    Paroxysmal A-fib ([place]) diagnosed 2008   a. On propafenone, Pradaxa.   Presence of permanent cardiac pacemaker    rheumatoid    Tachycardia-bradycardia syndrome ([place]) 05/27/2011   s/p MDT PPM by Dr Recardo Evangelist    Past Surgical History:  Procedure Laterality Date   BREAST REDUCTION SURGERY     BUNIONECTOMY Right 07/09/2015   with rods and pins   CARDIOVERSION N/A 08/02/2014   Procedure: CARDIOVERSION;  Surgeon: Sueanne Margarita, MD;  Location: [facility];  Service: Cardiovascular;  Laterality: N/A;   CATARACT EXTRACTION, BILATERAL Bilateral    FACIAL COSMETIC SURGERY     FOOT SURGERY Left    HAND SURGERY     multiple   LIPOSUCTION     NM MYOCAR PERF WALL MOTION  12/29/2006   No significant ischemia; EF 69%   OVARIAN CYST REMOVAL     PACEMAKER INSERTION  04/08/11   MDT Revo implanted by Dr Recardo Evangelist   PPM GENERATOR CHANGEOUT N/A 10/07/2021   Procedure: PPM GENERATOR CHANGEOUT;  Surgeon: Sanda Klein, MD;  Location: [facility];  Service: Cardiovascular;  Laterality: N/A;   TONSILLECTOMY     TOTAL KNEE ARTHROPLASTY Left 05/01/2015   Procedure: TOTAL LEFT KNEE ARTHROPLASTY;  Surgeon: Paralee Cancel, MD;  Location: [facility];  Service: Orthopedics;  Laterality: Left;   VAGINAL HYSTERECTOMY      Current Medications: Outpatient Medications Prior to Visit  Medication Sig Dispense Refill   clobetasol (TEMOVATE) 0.05 % external solution Apply 1 application  topically 2  times daily to affected area(s) as needed, Do not apply to face,groin or armpit (Patient taking differently: Apply 1 application  topically 2 (two) times daily as needed (to affected areas of the scalp- avoid face/groin/armpits).) 50 mL 3   Digoxin 62.5 MCG TABS Take 1 tablet by mouth every other day. (Patient taking differently: Take 62.5 mcg by mouth every other day.) 15 tablet 6   diltiazem (CARTIA XT) 120 MG 24 hr capsule Take 1 capsule (120 mg total) by mouth at bedtime. Keep upcoming appointment for future refills. 90 capsule 3   escitalopram (LEXAPRO) 20 MG tablet Take 1 tablet (20 mg total) by mouth daily. 90 tablet 0   HYDROcodone-acetaminophen (NORCO/VICODIN) 5-325 MG tablet Take 1 tablet by mouth every 6 (six) hours as needed. (Patient taking differently: Take 1 tablet by mouth 4 (four) times daily.) 120 tablet 0   HYDROcodone-acetaminophen (NORCO/VICODIN) 5-325 MG tablet Take 1 tablet by mouth every 6 (six) hours as needed. 120 tablet 0   hydroxychloroquine (PLAQUENIL) 200 MG tablet Take 1.5 tablets (300 mg total) by mouth daily with food or milk (  Patient taking differently: Take 200 mg by mouth in the morning.) 45 tablet 2   levothyroxine (SYNTHROID) 112 MCG tablet Take 1 tablet (112 mcg total) by mouth daily. 30 tablet 3   nitrofurantoin (MACRODANTIN) 50 MG capsule Take 1 capsule (50 mg total) by mouth daily. 30 capsule 6   predniSONE (DELTASONE) 10 MG tablet Take 3 tablets (30 mg total) by mouth daily with breakfast for 14 days, THEN 2 tablets (20 mg total) daily with breakfast for 14 days. 70 tablet 0   sulfamethoxazole-trimethoprim (BACTRIM) 400-80 MG tablet Take 1 tablet by mouth 3 (three) times a week. Monday, Wednesday, Friday. 30 tablet 0   traZODone (DESYREL) 150 MG tablet Take 0.5 tablets (75 mg total) by mouth once a day in the evening. (Patient taking differently: Take 75 mg by mouth at bedtime.) 30 tablet 6   triamcinolone cream (KENALOG) 0.1 % Apply 1 application  to affected area(s) up to 2 (two) times daily as needed. Do not apply to face, groin, or armpit. (Patient taking differently: Apply 1 application  topically 2 (two) times daily as needed (to affected areas, for contact dermatitis- avoid facegroin/armpits).) 454 g 3   No facility-administered medications prior to visit.     Allergies:   Azithromycin, Epinephrine, Penicillins, Lidocaine, and Metoprolol   Social History   Socioeconomic History   Marital status: Divorced    Spouse name: Not on file   Number of children: 3   Years of education: Not on file   Highest education level: Not on file  Occupational History   Occupation: retired    Fish farm manager: RETIRED  Tobacco Use   Smoking status: Former    Types: Cigarettes    Quit date: 08/30/1996    Years since quitting: 25.5   Smokeless tobacco: Never  Substance and Sexual Activity   Alcohol use: Yes    Alcohol/week: 14.0 standard drinks of alcohol    Types: 14 Glasses of wine per week    Comment: daily   Drug use: No   Sexual activity: Yes  Other Topics Concern   Not on file  Social History Narrative   Lives in [place],  Former Engineer, structural,  Separated.     Social Determinants of Health   Financial Resource Strain: Not on file  Food Insecurity: Not on file  Transportation Needs: Not on file  Physical Activity: Not on file  Stress: Not on file  Social Connections: Not on file     Family History:  The patient's family history includes Anesthesia problems in her mother; Colon polyps in her mother.   ROS:   Please see the history of present illness.    All other systems are reviewed and are negative.   PHYSICAL EXAM:   VS:  There were no vitals taken for this visit.     General: Alert, oriented x3, no distress, appears well.  Left subclavian pacer site has healed nicely.  The device distally Prominently due to weight loss.  She is borderline underweight. Head: no evidence of trauma, PERRL, EOMI, no exophtalmos  or lid lag, no myxedema, no xanthelasma; normal ears, nose and oropharynx Neck: normal jugular venous pulsations and no hepatojugular reflux; brisk carotid pulses without delay and no carotid bruits Chest: clear to auscultation, no signs of consolidation by percussion or palpation, normal fremitus, symmetrical and full respiratory excursions Cardiovascular: normal position and quality of the apical impulse, regular rhythm, normal first and second heart sounds, no murmurs, rubs or gallops Abdomen: no tenderness  or distention, no masses by palpation, no abnormal pulsatility or arterial bruits, normal bowel sounds, no hepatosplenomegaly Extremities: no clubbing, cyanosis or edema; 2+ radial, ulnar and brachial pulses bilaterally; 2+ right femoral, posterior tibial and dorsalis pedis pulses; 2+ left femoral, posterior tibial and dorsalis pedis pulses; no subclavian or femoral bruits Neurological: grossly nonfocal Psych: Normal mood and affect     Wt Readings from Last 3 Encounters:  03/04/22 112 lb 12.8 oz (51.2 kg)  01/31/22 118 lb 6.2 oz (53.7 kg)  01/13/22 119 lb 6.4 oz (54.2 kg)      Studies/Labs Reviewed:   EKG:  EKG is not ordered today.  The intracardiac electrogram shows atrial paced, ventricular sensed rhythm.  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  Recent Labs: 09/19/2021: TSH 0.622 01/25/2022: B Natriuretic Peptide 16.4 01/26/2022: ALT 9 01/29/2022: Hemoglobin 10.1; Magnesium 2.1; Platelets 373 01/31/2022: BUN 28; Creatinine, Ser 1.11; Potassium 4.5; Sodium 135   Lipid Panel    Component Value Date/Time   CHOL  12/19/2006 0315    172        ATP III CLASSIFICATION:  <200     mg/dL   Desirable  200-239  mg/dL   Borderline High  >=240    mg/dL   High   TRIG 293 (H) 12/19/2006 0315   HDL 37 (L) 12/19/2006 0315   CHOLHDL 4.6 12/19/2006 0315    VLDL 59 (H) 12/19/2006 0315   LDLCALC  12/19/2006 0315    76        Total Cholesterol/HDL:CHD Risk Coronary Heart Disease Risk Table                     Men   Women  1/2 Average Risk   3.4   3.3    Additional studies/ records that were reviewed today include:  Notes from recent hospitalization.  ASSESSMENT:    No diagnosis found.      PLAN:  In order of problems listed above:  AFib: After having a good many years of control with propafenone she had severe breakthrough arrhythmia and persistent atrial fibrillation, difficult to control RVR and was switched to amiodarone roughly 3 months ago.  Arrhythmia control has improved since then.  Although there are still breakthrough episodes of atrial fibrillation and RVR, overall rate control is very satisfactory and she is asymptomatic.Marland KitchenCHADSVasc 4 (age 1, gender, HTN) without history of stroke or TIA.  Anticoagulation has been discontinued due to her gait instability and frequent falls which have caused serious injuries. Amiodarone: Normal liver and thyroid function tests 3 months ago should be repeated before the end of the year.  Watch carefully for signs or symptoms of digitalis toxicity due to drug interaction.  If she continues to have good arrhythmia control without frequent episodes of RVR, may decide to stop digoxin.  Dig level was 0.5 in April and should be rechecked when she has her thyroid function tests and liver function test repeated.  Also discussed the possibility of multiple drug interactions, the need to protect from excessive sunlight exposure, yearly ophthalmologic exam and the rare but serious possibility of amiodarone lung injury. SSS: She is much more active than before.  Has good heart rate histogram distribution (virtually 100% atrial paced when she is not in atrial fibrillation. PPM: Well-healed after generator change out 3 months ago. HTN: Well-controlled.  Has a history of symptomatic orthostatic hypotension.  Now  that arrhythmia is better controlled may decide to discontinue diltiazem, if low blood pressure becomes an issue.   Medication Adjustments/Labs and Tests Ordered: Current medicines are reviewed at length with the patient today.  Concerns regarding medicines are outlined above.  Medication changes, Labs and Tests ordered today are listed in the Patient Instructions below. There are no Patient Instructions on file for this visit.   Signed, Sanda Klein, MD  03/14/2022 3:47 PM    [facility] [place], [place], [place]  [address] Phone: [phone]; Fax: [phone]

## 2022-03-17 ENCOUNTER — Other Ambulatory Visit (HOSPITAL_COMMUNITY): Payer: Self-pay

## 2022-03-19 ENCOUNTER — Other Ambulatory Visit (HOSPITAL_COMMUNITY): Payer: Self-pay

## 2022-03-20 ENCOUNTER — Other Ambulatory Visit (HOSPITAL_COMMUNITY): Payer: Self-pay

## 2022-03-20 ENCOUNTER — Encounter: Payer: Self-pay | Admitting: Cardiovascular Disease

## 2022-03-20 MED ORDER — DIGOXIN 125 MCG PO TABS
0.1250 mg | ORAL_TABLET | ORAL | 3 refills | Status: DC
  Filled 2022-03-20: qty 45, 90d supply, fill #0
  Filled 2022-06-20: qty 45, 90d supply, fill #1

## 2022-03-20 NOTE — Telephone Encounter (Signed)
Just as good to take 0.125 mg every other day, because the tablets are very difficult to cut. Please change the prescription.

## 2022-03-20 NOTE — Telephone Encounter (Signed)
sent to the Dr Sallyanne Kuster for review and response

## 2022-03-26 ENCOUNTER — Ambulatory Visit (INDEPENDENT_AMBULATORY_CARE_PROVIDER_SITE_OTHER): Payer: Medicare Other | Admitting: Pulmonary Disease

## 2022-03-26 ENCOUNTER — Encounter: Payer: Self-pay | Admitting: Pulmonary Disease

## 2022-03-26 ENCOUNTER — Other Ambulatory Visit (HOSPITAL_COMMUNITY): Payer: Self-pay

## 2022-03-26 VITALS — BP 126/66 | HR 85 | Temp 98.1°F | Ht 63.0 in | Wt 123.0 lb

## 2022-03-26 DIAGNOSIS — T462X1S Poisoning by other antidysrhythmic drugs, accidental (unintentional), sequela: Secondary | ICD-10-CM | POA: Diagnosis not present

## 2022-03-26 DIAGNOSIS — R0902 Hypoxemia: Secondary | ICD-10-CM

## 2022-03-26 MED ORDER — PREDNISONE 10 MG PO TABS
ORAL_TABLET | ORAL | 0 refills | Status: AC
  Filled 2022-03-26: qty 108, 90d supply, fill #0

## 2022-03-26 NOTE — Progress Notes (Signed)
@Patient  ID: , female    DOB: 1946/01/13, 76 y.o.   MRN: [id number]  Chief Complaint  Patient presents with   Follow-up    Steroid helped some. SOB has gotten a little better    Referring provider: [id number], MD  HPI:   76 y.o. whom are seen in the follow-up for evaluation of abnormal lung imaging and hypoxemic respiratory failure.  Most recent cardiology note reviewed.   Placed on prednisone therapy at last visit for possible amiodarone lung toxicity.  40 mg for 2 weeks, now on 20 mg.  Her oxygenation seems improved.  The saturations with exertion without her oxygen are now high 80s, [id number].  Previously in the high 70s.  At rest her oxygen stays in the mid 90s.  She has more energy.  Appetite is better.  Likely result of prednisone.  HPI at initial visit 03/04/2022 Patient was in usual state of health.  About 3 months ago was placed on amiodarone for atrial fibrillation.  Within the last month to 6-week she developed worsening shortness of breath and cough.  Seems cough made was the first symptom.  Eventually was hospitalized 01/2022.  Found to be hypoxemic on room air.  CT scan of the chest with contrast on my review interpretation reveals no evidence of PE, moderate to large bilateral pleural effusions, scattered and diffuse interstitial thickening, interlobular septal thickening, as well as scattered groundglass opacities most consistent with volume overload.  In the hospital, she was treated for pneumonia as well as pulmonary edema.  She received Lasix, antibiotics.  She also was sent out with a steroid taper.  The flow rate of oxygen was decreased and she has been on 2 L since.  Per daughters in the room, they are not sure steroids really helped.  Does not feel like there is marked improvement with steroid therapy.  She has good days and bad days.  Some days her oxygen stays up, some days she has worsening desaturation.  Today seems like relative good day.  In her oxygen  saturation today when walking to the room was higher and it was yesterday at a doctor's visit.  Patient describes cough has persisted although hard to tell if better or worse.  Her dyspnea is largely unchanged despite interventions.  She does not take Lasix daily.  Sounds like they describe orthostatic hypotension, dizziness lightheadedness when rising from seated position.  In addition recently in the hospital she had development of mild AKI in the setting of diuretic therapy.  Only using Lasix as needed.  Not using very much.  After discharge from the hospital, her EP doctor was contacted.  He recommended stopping amiodarone.  Recommend pulmonary consultation was prompted today's visit.  Labs were obtained by him which show normal CRP and sedimentation rate.  PMH: Congestive heart failure, mild cognitive impairment, hypothyroidism, chronic UTIs Surgical history: Breast surgery, hand surgery, foot surgery, tonsillectomy, knee replacement, hysterectomy Family history: No significant rest or illness in first relatives  Questionaires / Pulmonary Flowsheets:   ACT:      No data to display           MMRC:     No data to display           Epworth:      No data to display           Tests:   FENO:  No results found for: "NITRICOXIDE"  PFT:     No data to display  WALK:      No data to display           Imaging: Personally reviewed and as per EMR discussion in this note CT ABDOMEN PELVIS WO CONTRAST  Result Date: 03/03/2022 CLINICAL DATA:  Left lower quadrant abdominal pain EXAM: CT ABDOMEN AND PELVIS WITHOUT CONTRAST TECHNIQUE: Multidetector CT imaging of the abdomen and pelvis was performed following the standard protocol without IV contrast. RADIATION DOSE REDUCTION: This exam was performed according to the departmental dose-optimization program which includes automated exposure control, adjustment of the mA and/or kV according to patient size  and/or use of iterative reconstruction technique. COMPARISON:  CT 03/05/2020 FINDINGS: Lower chest: Trace bilateral pleural effusions with interlobular septal thickening throughout the imaged lung bases. Hepatobiliary: Unremarkable unenhanced appearance of the liver. No focal liver lesion identified. Gallbladder within normal limits. No hyperdense gallstone. No biliary dilatation. Pancreas: Unremarkable. No pancreatic ductal dilatation or surrounding inflammatory changes. Spleen: Normal in size without focal abnormality. Adrenals/Urinary Tract: Adrenal glands are unremarkable. Kidneys are normal, without renal calculi, focal lesion, or hydronephrosis. Bladder is unremarkable for the degree of distension. Stomach/Bowel: Stomach is within normal limits. Appendix not visualized. Minimal scattered diverticulosis. No evidence of bowel wall thickening, distention, or inflammatory changes. Vascular/Lymphatic: Aortic atherosclerosis. No enlarged abdominal or pelvic lymph nodes. Reproductive: Status post hysterectomy. No adnexal masses. Other: No free fluid. No abdominopelvic fluid collection. No pneumoperitoneum. No abdominal wall hernia. Musculoskeletal: Dextroscoliotic curvature of the lumbar spine with multilevel degenerative disc disease. No acute bony findings. IMPRESSION: 1. No acute abdominopelvic findings. 2. Minimal scattered diverticulosis without evidence of acute diverticulitis. 3. Trace bilateral pleural effusions with interlobular septal thickening throughout the imaged lung bases, suggestive of pulmonary edema. 4. Aortic Atherosclerosis (ICD10-I70.0). Electronically Signed   By: Davina Poke D.O.   On: 03/03/2022 13:13    Lab Results: Personally reviewed CBC    Component Value Date/Time   WBC 9.2 01/29/2022 0802   RBC 3.57 (L) 01/29/2022 0802   HGB 10.1 (L) 01/29/2022 0802   HGB 12.3 09/30/2021 1335   HCT 31.4 (L) 01/29/2022 0802   HCT 36.1 09/30/2021 1335   PLT 373 01/29/2022 0802   PLT 274  09/30/2021 1335   MCV 88.0 01/29/2022 0802   MCV 92 09/30/2021 1335   MCH 28.3 01/29/2022 0802   MCHC 32.2 01/29/2022 0802   RDW 13.0 01/29/2022 0802   RDW 12.0 09/30/2021 1335   LYMPHSABS 0.5 (L) 01/29/2022 0802   MONOABS 0.3 01/29/2022 0802   EOSABS 0.0 01/29/2022 0802   BASOSABS 0.0 01/29/2022 0802    BMET    Component Value Date/Time   NA 135 01/31/2022 0528   K 4.5 01/31/2022 0528   CL 98 01/31/2022 0528   CO2 30 01/31/2022 0528   GLUCOSE 111 (H) 01/31/2022 0528   BUN 28 (H) 01/31/2022 0528   CREATININE 1.11 (H) 01/31/2022 0528   CREATININE 1.05 02/23/2013 1550   CALCIUM 8.9 01/31/2022 0528   GFRNONAA 51 (L) 01/31/2022 0528   GFRAA >60 03/05/2020 1620    BNP    Component Value Date/Time   BNP 16.4 01/25/2022 1630    ProBNP    Component Value Date/Time   PROBNP 1,110.0 (H) 09/18/2013 0332    Specialty Problems       Pulmonary Problems   Obstructive sleep apnea of adult    Patient had diagnosis some 2 years or so ago and was not able to comply with use of CPAP.  Since this time however she has  lost weight and by family information she does make fewer "noises of snoring" during night time  Patient still has review of systems positive for sleep apnea with nocturia at least once a night if not more often 2-3 times nightly      Hypoxia    Allergies  Allergen Reactions   Azithromycin Other (See Comments)    Causes heart rhythm issues    Epinephrine Other (See Comments)    Causes A-Fib    Penicillins Anaphylaxis    Has patient had a PCN reaction causing immediate rash, facial/tongue/throat swelling, SOB or lightheadedness with hypotension: Yes Has patient had a PCN reaction causing severe rash involving mucus membranes or skin necrosis: No Has patient had a PCN reaction that required hospitalization No Has patient had a PCN reaction occurring within the last 10 years: No If all of the above answers are "NO", then may proceed with Cephalosporin use.     Lidocaine Palpitations   Metoprolol Other (See Comments)    alopecia     Immunization History  Administered Date(s) Administered   Influenza, High Dose Seasonal PF 04/18/2017, 03/11/2018   Influenza,inj,quad, With Preservative 04/15/2017   Influenza-Unspecified 03/08/2013, 04/15/2017, 03/11/2018   PFIZER(Purple Top)SARS-COV-2 Vaccination 07/01/2019, 07/22/2019   Pneumococcal Polysaccharide-23 10/09/2012   Pneumococcal-Unspecified 10/14/2016    Past Medical History:  Diagnosis Date   Anticoagulant long-term use  WITH PRADAXA 05/27/2011   Anxiety    Cellulitis of buttock, left 12/20/2014   Complication of anesthesia    DIFFICULTY WAKING UP   Depression    Diverticulosis    DJD (degenerative joint disease)    Esophagitis 09/17/06   External hemorrhoids    GERD (gastroesophageal reflux disease)    Glaucoma    History of stress test    a. 2008 - normal nuc.   Hypertension    Hypothyroid    IBS (irritable bowel syndrome)    Paroxysmal A-fib (HCC) diagnosed 2008   a. On propafenone, Pradaxa.   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   s/p MDT PPM by Dr Rubie Maid    Tobacco History: Social History   Tobacco Use  Smoking Status Former   Types: Cigarettes   Quit date: 08/30/1996   Years since quitting: 25.5  Smokeless Tobacco Never   Counseling given: Not Answered   Continue to not smoke  Outpatient Encounter Medications as of 03/26/2022  Medication Sig   clobetasol (TEMOVATE) 0.05 % external solution Apply 1 application topically 2  times daily to affected area(s) as needed, Do not apply to face,groin or armpit (Patient taking differently: Apply 1 application  topically 2 (two) times daily as needed (to affected areas of the scalp- avoid face/groin/armpits).)   digoxin (LANOXIN) 0.125 MG tablet Take 1 tablet (0.125 mg total) by mouth every other day.   diltiazem (CARTIA XT) 120 MG 24 hr capsule Take 1  capsule (120 mg total) by mouth at bedtime. Keep upcoming appointment for future refills.   escitalopram (LEXAPRO) 20 MG tablet Take 1 tablet (20 mg total) by mouth daily.   HYDROcodone-acetaminophen (NORCO/VICODIN) 5-325 MG tablet Take 1 tablet by mouth every 6 (six) hours as needed. (Patient taking differently: Take 1 tablet by mouth 4 (four) times daily.)   hydroxychloroquine (PLAQUENIL) 200 MG tablet Take 1.5 tablets (300 mg total) by mouth daily with food or milk (Patient taking differently: Take 200 mg by mouth in the morning.)   levothyroxine (SYNTHROID) 112 MCG tablet Take 1  tablet (112 mcg total) by mouth daily.   midodrine (PROAMATINE) 5 MG tablet Take 1 tablet (5 mg total) by mouth every 6 (six) hours as needed for a low blood pressure. Do not take 4 hours before bed. Do not take for a systolic blood pressure over 120.   nitrofurantoin (MACRODANTIN) 50 MG capsule Take 1 capsule (50 mg total) by mouth daily.   predniSONE (DELTASONE) 10 MG tablet Take 2 tablets  by mouth daily with breakfast for 14 days, THEN 1.5 tablets daily with breakfast for 28 days, THEN 1 tablet daily with breakfast for 28 days, THEN 0.5 tablets daily with breakfast for 28 days.   sulfamethoxazole-trimethoprim (BACTRIM) 400-80 MG tablet Take 1 tablet by mouth 3 (three) times a week. Monday, Wednesday, Friday.   traZODone (DESYREL) 150 MG tablet Take 0.5 tablets (75 mg total) by mouth once a day in the evening. (Patient taking differently: Take 75 mg by mouth at bedtime.)   triamcinolone cream (KENALOG) 0.1 % Apply 1 application to affected area(s) up to 2 (two) times daily as needed. Do not apply to face, groin, or armpit. (Patient taking differently: Apply 1 application  topically 2 (two) times daily as needed (to affected areas, for contact dermatitis- avoid facegroin/armpits).)   [DISCONTINUED] predniSONE (DELTASONE) 10 MG tablet Take 3 tablets (30 mg total) by mouth daily with breakfast for 14 days, THEN 2 tablets (20  mg total) daily with breakfast for 14 days.   [DISCONTINUED] HYDROcodone-acetaminophen (NORCO/VICODIN) 5-325 MG tablet Take 1 tablet by mouth every 6 (six) hours as needed.   No facility-administered encounter medications on file as of 03/26/2022.     Review of Systems  Review of Systems  No chest pain with exertion.  No orthopnea or PND.  Comprehensive review of systems otherwise negative. Physical Exam  BP 126/66 (BP Location: Left Arm, Patient Position: Sitting, Cuff Size: Normal)   Pulse 85   Temp 98.1 F (36.7 C) (Oral)   Ht 5\' 3"  (1.6 m)   Wt 123 lb (55.8 kg)   SpO2 93%   BMI 21.79 kg/m   Wt Readings from Last 5 Encounters:  03/26/22 123 lb (55.8 kg)  03/14/22 119 lb (54 kg)  03/04/22 112 lb 12.8 oz (51.2 kg)  01/31/22 118 lb 6.2 oz (53.7 kg)  01/13/22 119 lb 6.4 oz (54.2 kg)    BMI Readings from Last 5 Encounters:  03/26/22 21.79 kg/m  03/14/22 21.08 kg/m  03/04/22 19.98 kg/m  01/31/22 20.97 kg/m  01/13/22 21.15 kg/m     Physical Exam General: Frail, elderly, sitting in chair Eyes: EOMI, no icterus Neck: Supple, no JVP appreciated sitting upright Pulmonary: Clear, prior crackles no longer heard, normal work of breathing on 2 L nasal cannula Cardiovascular: No lower extremity edema, warm Abdomen: Nondistended, bowel sounds present MSK: No synovitis, no joint effusion Neuro: Slow gait, no focal weakness Psych: Alert, attentive, normal mood   Assessment & Plan:   Interstitial and alveolar filling/GGO's on CT scan due to 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  Initially, certainly a component of volume overload given concomitant moderate to large pleural effusions on initial CT scan 01/29/2022.  The presence of relative volume overload makes further radiographic evaluation very challenging to near impossible.  CT scan abdomen pelvis 03/03/2022 demonstrates improved size of pleural effusions, less GGO's, ongoing interstitial infiltrates.  Again,  element overload is suspected.  However, compared to CT abdomen pelvis in 2020, with presence of similar sized pleural effusions and lack of interstitial infiltrates,  other etiologies are possible.  Curiously, CRP and sed rate within normal limits on initial evaluation.  It is possible symptoms and imaging do represent amiodarone toxicity or some other initial lung disease but the lack of inflammatory markers is concerning.  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 she has completed 2 weeks of prednisone 30 mg daily (0.5 mg/KG) now on 20 mg daily.  Her oxygenation seems to improved.  Her subjective dyspnea is improved.  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  Continue prednisone taper.  Plan total 4 weeks on 20 mg, then decrease by 5 mg every 4 weeks thereafter.  New prescription today.  Continue Bactrim Monday Wednesday Friday until prednisone dose is 15 mg or less.  Would benefit from repeat cross-sectional imaging toward the end or after prednisone therapy.  Return in about 3 months (around 06/26/2022).   Karren Burly, MD 03/26/2022

## 2022-03-26 NOTE — Patient Instructions (Signed)
Nice to see you again  I am glad that the prednisone seems to be helping particular with your oxygen saturation  The goal oxygen saturation is 88%.  It is okay to try to go down or turn off the oxygen and check your oxygen saturations long to stays above 88% then you will not need it.  , Continue with exertion but continue to check to see if it continues to improve.  I extended the prednisone taper.  Please use the supply you currently have.  Supply has ended take prednisone as follows:  20 mg for 2 weeks, 15 mg for 4 weeks, 10 mg for 4 weeks, grams for 4 weeks then stop  Continue Bactrim Monday Wednesday and Friday until your dose is 15 mg.  Once you start the 15 mg dose it is okay to stop the Bactrim.  Return to clinic in 3 months or sooner as needed with Dr. Silas Flood

## 2022-03-27 ENCOUNTER — Other Ambulatory Visit (HOSPITAL_COMMUNITY): Payer: Self-pay

## 2022-04-03 ENCOUNTER — Other Ambulatory Visit (HOSPITAL_COMMUNITY): Payer: Self-pay

## 2022-04-03 MED ORDER — HYDROCODONE-ACETAMINOPHEN 5-325 MG PO TABS
1.0000 | ORAL_TABLET | Freq: Four times a day (QID) | ORAL | 0 refills | Status: DC | PRN
  Filled 2022-04-03: qty 120, 30d supply, fill #0

## 2022-04-07 ENCOUNTER — Other Ambulatory Visit (HOSPITAL_COMMUNITY): Payer: Self-pay

## 2022-04-07 MED ORDER — NITROFURANTOIN MACROCRYSTAL 50 MG PO CAPS
50.0000 mg | ORAL_CAPSULE | Freq: Every day | ORAL | 6 refills | Status: DC
  Filled 2022-04-07: qty 30, 30d supply, fill #0
  Filled 2022-05-02: qty 30, 30d supply, fill #1
  Filled 2022-06-02: qty 30, 30d supply, fill #2
  Filled 2022-07-01: qty 30, 30d supply, fill #3
  Filled 2022-07-29: qty 30, 30d supply, fill #4

## 2022-04-08 ENCOUNTER — Other Ambulatory Visit (HOSPITAL_COMMUNITY): Payer: Self-pay

## 2022-04-08 ENCOUNTER — Ambulatory Visit (INDEPENDENT_AMBULATORY_CARE_PROVIDER_SITE_OTHER): Payer: Medicare Other

## 2022-04-08 DIAGNOSIS — I495 Sick sinus syndrome: Secondary | ICD-10-CM

## 2022-04-09 LAB — CUP PACEART REMOTE DEVICE CHECK
Battery Remaining Longevity: 157 mo
Battery Voltage: 3.16 V
Brady Statistic AP VP Percent: 0.08 %
Brady Statistic AP VS Percent: 95.6 %
Brady Statistic AS VP Percent: 0.03 %
Brady Statistic AS VS Percent: 4.31 %
Brady Statistic RA Percent Paced: 89.13 %
Brady Statistic RV Percent Paced: 0.96 %
Date Time Interrogation Session: 20231024154059
Implantable Lead Connection Status: 753985
Implantable Lead Connection Status: 753985
Implantable Lead Implant Date: 20121023
Implantable Lead Implant Date: 20121023
Implantable Lead Location: 753859
Implantable Lead Location: 753860
Implantable Pulse Generator Implant Date: 20230424
Lead Channel Impedance Value: 1007 Ohm
Lead Channel Impedance Value: 1311 Ohm
Lead Channel Impedance Value: 323 Ohm
Lead Channel Impedance Value: 399 Ohm
Lead Channel Pacing Threshold Amplitude: 0.5 V
Lead Channel Pacing Threshold Amplitude: 2 V
Lead Channel Pacing Threshold Pulse Width: 0.4 ms
Lead Channel Pacing Threshold Pulse Width: 0.4 ms
Lead Channel Sensing Intrinsic Amplitude: 1.75 mV
Lead Channel Sensing Intrinsic Amplitude: 1.75 mV
Lead Channel Sensing Intrinsic Amplitude: 9.25 mV
Lead Channel Sensing Intrinsic Amplitude: 9.25 mV
Lead Channel Setting Pacing Amplitude: 1.5 V
Lead Channel Setting Pacing Amplitude: 3.5 V
Lead Channel Setting Pacing Pulse Width: 0.7 ms
Lead Channel Setting Sensing Sensitivity: 0.9 mV
Zone Setting Status: 755011
Zone Setting Status: 755011

## 2022-04-17 ENCOUNTER — Other Ambulatory Visit (HOSPITAL_COMMUNITY): Payer: Self-pay

## 2022-04-18 ENCOUNTER — Other Ambulatory Visit (HOSPITAL_COMMUNITY): Payer: Self-pay

## 2022-04-18 MED ORDER — HYDROXYCHLOROQUINE SULFATE 200 MG PO TABS
300.0000 mg | ORAL_TABLET | Freq: Every day | ORAL | 2 refills | Status: DC
  Filled 2022-04-18: qty 45, 30d supply, fill #0
  Filled 2022-05-12: qty 45, 30d supply, fill #1
  Filled 2022-06-20: qty 45, 30d supply, fill #2

## 2022-04-22 ENCOUNTER — Other Ambulatory Visit (HOSPITAL_COMMUNITY): Payer: Self-pay

## 2022-04-28 NOTE — Progress Notes (Signed)
Remote pacemaker transmission.   

## 2022-05-01 ENCOUNTER — Other Ambulatory Visit (HOSPITAL_COMMUNITY): Payer: Self-pay

## 2022-05-01 MED ORDER — HYDROCODONE-ACETAMINOPHEN 5-325 MG PO TABS
1.0000 | ORAL_TABLET | Freq: Four times a day (QID) | ORAL | 0 refills | Status: DC | PRN
  Filled 2022-05-01: qty 120, 30d supply, fill #0

## 2022-05-02 ENCOUNTER — Other Ambulatory Visit (HOSPITAL_COMMUNITY): Payer: Self-pay

## 2022-05-12 ENCOUNTER — Other Ambulatory Visit (HOSPITAL_COMMUNITY): Payer: Self-pay

## 2022-05-15 ENCOUNTER — Encounter (INDEPENDENT_AMBULATORY_CARE_PROVIDER_SITE_OTHER): Payer: Medicare Other | Admitting: Ophthalmology

## 2022-05-15 ENCOUNTER — Encounter (INDEPENDENT_AMBULATORY_CARE_PROVIDER_SITE_OTHER): Payer: Self-pay

## 2022-05-16 ENCOUNTER — Telehealth: Payer: Self-pay | Admitting: Pulmonary Disease

## 2022-05-16 NOTE — Telephone Encounter (Signed)
Called and spoke with [name] (patient's daughter, on [place]). She stated that her mother's face has appeared red, swollen and itchy for the past 2 days. She is not sure if her mother put something on her face to cause the reaction as she has dementia. [name] spoke with the local pharmacist and he suggested that it may be from the prednisone. She has been on the prednisone since September and this has never happened before.   She denied any increased SOB, tongue swelling or difficulty swallowing. [name] was at the [employer] some Aquaphor for her to use on her face. She has also given her 25mg  of Benadryl to help with the itching. Patient has not started any new medications recently. She is currently on 10mg  of prednisone daily.   I advised that I would send a message to one of our APPs just to be on the safe side. She verbalized understanding.   TP, can you please advise? Thanks!

## 2022-05-16 NOTE — Telephone Encounter (Signed)
Spoke with [name] (pt's daughter per [facility]) and reviewed Tammy's advice. [name] stated understanding. Nothing further needed at this time.

## 2022-05-16 NOTE — Telephone Encounter (Signed)
Definitely prednisone can cause skin irritation typically not a drug reaction but it is always possible.  Can try the Aquaphor to see if this helps.  Also can take half of a Benadryl or Zyrtec to see if this helps as well.  If not improving or worsens will definitely need to be seen or go to the emergency room or urgent care.  She is on multiple medications so would be difficult to determine which medication is causing  Please contact office for sooner follow up if symptoms do not improve or worsen or seek emergency care

## 2022-05-30 ENCOUNTER — Other Ambulatory Visit (HOSPITAL_COMMUNITY): Payer: Self-pay

## 2022-06-02 ENCOUNTER — Other Ambulatory Visit (HOSPITAL_COMMUNITY): Payer: Self-pay

## 2022-06-02 MED ORDER — HYDROCODONE-ACETAMINOPHEN 5-325 MG PO TABS
1.0000 | ORAL_TABLET | Freq: Four times a day (QID) | ORAL | 0 refills | Status: DC | PRN
  Filled 2022-06-02: qty 120, 30d supply, fill #0

## 2022-06-02 MED ORDER — ESCITALOPRAM OXALATE 20 MG PO TABS
20.0000 mg | ORAL_TABLET | Freq: Every day | ORAL | 2 refills | Status: DC
  Filled 2022-06-02: qty 90, 90d supply, fill #0

## 2022-06-20 ENCOUNTER — Other Ambulatory Visit: Payer: Self-pay

## 2022-06-20 ENCOUNTER — Other Ambulatory Visit (HOSPITAL_COMMUNITY): Payer: Self-pay

## 2022-06-20 MED ORDER — LEVOTHYROXINE SODIUM 112 MCG PO TABS
112.0000 ug | ORAL_TABLET | Freq: Every day | ORAL | 3 refills | Status: DC
  Filled 2022-06-20: qty 30, 30d supply, fill #0
  Filled 2022-07-17: qty 30, 30d supply, fill #1

## 2022-06-24 ENCOUNTER — Ambulatory Visit (INDEPENDENT_AMBULATORY_CARE_PROVIDER_SITE_OTHER): Payer: Medicare Other | Admitting: Pulmonary Disease

## 2022-06-24 ENCOUNTER — Encounter: Payer: Self-pay | Admitting: Pulmonary Disease

## 2022-06-24 VITALS — BP 128/64 | HR 66 | Temp 98.9°F | Wt 136.6 lb

## 2022-06-24 DIAGNOSIS — R0902 Hypoxemia: Secondary | ICD-10-CM

## 2022-06-24 DIAGNOSIS — T462X1S Poisoning by other antidysrhythmic drugs, accidental (unintentional), sequela: Secondary | ICD-10-CM

## 2022-06-24 DIAGNOSIS — J702 Acute drug-induced interstitial lung disorders: Secondary | ICD-10-CM

## 2022-06-24 NOTE — Progress Notes (Signed)
@Patient  ID: , female    DOB: January 24, 1946, 77 y.o.   MRN: [id number]  Chief Complaint  Patient presents with   Follow-up    Daughter states that she feels like her mom is needing the oxygen more. Pt is needing hte oxygen around the house more. But she states she is feeling better. 2L of o2 per tank. Pt is on prednisone and has 10 more days left    Referring provider: [id number], MD  HPI:   77 y.o. 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  Most recent cardiology note reviewed.   She continues on prednisone therapy.  Initial improvement on prednisone therapy.  Now down to 5 mg a day.  With decreasing slowly every 4 weeks.  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  She has plateaued in terms of oxygen therapy.  Less energy recently.  Do query if this is related to decreasing prednisone doses.  No issues with cough.  Otherwise seems to be doing relatively okay.  HPI at initial visit 03/04/2022 Patient was in usual state of health.  About 3 months ago was placed on amiodarone for atrial fibrillation.  Within the last month to 6-week she developed worsening shortness of breath and cough.  Seems cough made was the first symptom.  Eventually was hospitalized 01/2022.  Found to be hypoxemic on room air.  CT scan of the chest with contrast on my review interpretation reveals no evidence of PE, moderate to large bilateral pleural effusions, scattered and diffuse interstitial thickening, interlobular septal thickening, as well as scattered groundglass opacities most consistent with volume overload.  In the hospital, she was treated for pneumonia as well as pulmonary edema.  She received Lasix, antibiotics.  She also was sent out with a steroid taper.  The flow rate of oxygen was decreased and she has been on 2  L since.  Per daughters in the room, they are not sure steroids really helped.  Does not feel like there is marked improvement with steroid therapy.  She has good days and bad days.  Some days her oxygen stays up, some days she has worsening desaturation.  Today seems like relative good day.  In her oxygen saturation today when walking to the room was higher and it was yesterday at a doctor's visit.  Patient describes cough has persisted although hard to tell if better or worse.  Her dyspnea is largely unchanged despite interventions.  She does not take Lasix daily.  Sounds like they describe orthostatic hypotension, dizziness lightheadedness when rising from seated position.  In addition recently in the hospital she had development of mild AKI in the setting of diuretic therapy.  Only using Lasix as needed.  Not using very much.  After discharge from the hospital, her EP doctor was contacted.  He recommended stopping amiodarone.  Recommend pulmonary consultation was prompted today's visit.  Labs were obtained by him which show normal CRP and sedimentation rate.  PMH: Congestive heart failure, mild cognitive impairment, hypothyroidism, chronic UTIs Surgical history: Breast surgery, hand surgery, foot surgery, tonsillectomy, knee replacement, hysterectomy Family history: No significant rest or illness in first relatives  Questionaires / Pulmonary Flowsheets:   ACT:      No data to display           MMRC:     No  data to display           Epworth:      No data to display           Tests:   FENO:  No results found for: "NITRICOXIDE"  PFT:     No data to display           WALK:      No data to display           Imaging: Personally reviewed and as per EMR discussion in this note No results found.  Lab Results: Personally reviewed CBC    Component Value Date/Time   WBC 9.2 01/29/2022 0802   RBC 3.57 (L) 01/29/2022 0802   HGB 10.1 (L) 01/29/2022 0802    HGB 12.3 09/30/2021 1335   HCT 31.4 (L) 01/29/2022 0802   HCT 36.1 09/30/2021 1335   PLT 373 01/29/2022 0802   PLT 274 09/30/2021 1335   MCV 88.0 01/29/2022 0802   MCV 92 09/30/2021 1335   MCH 28.3 01/29/2022 0802   MCHC 32.2 01/29/2022 0802   RDW 13.0 01/29/2022 0802   RDW 12.0 09/30/2021 1335   LYMPHSABS 0.5 (L) 01/29/2022 0802   MONOABS 0.3 01/29/2022 0802   EOSABS 0.0 01/29/2022 0802   BASOSABS 0.0 01/29/2022 0802    BMET    Component Value Date/Time   NA 135 01/31/2022 0528   K 4.5 01/31/2022 0528   CL 98 01/31/2022 0528   CO2 30 01/31/2022 0528   GLUCOSE 111 (H) 01/31/2022 0528   BUN 28 (H) 01/31/2022 0528   CREATININE 1.11 (H) 01/31/2022 0528   CREATININE 1.05 02/23/2013 1550   CALCIUM 8.9 01/31/2022 0528   GFRNONAA 51 (L) 01/31/2022 0528   GFRAA >60 03/05/2020 1620    BNP    Component Value Date/Time   BNP 16.4 01/25/2022 1630    ProBNP    Component Value Date/Time   PROBNP 1,110.0 (H) 09/18/2013 0332    Specialty Problems       Pulmonary Problems   Obstructive sleep apnea of adult    Patient had diagnosis some 2 years or so ago and was not able to comply with use of CPAP.  Since this time however she has lost weight and by family information she does make fewer "noises of snoring" during night time  Patient still has review of systems positive for sleep apnea with nocturia at least once a night if not more often 2-3 times nightly      Hypoxia    Allergies  Allergen Reactions   Azithromycin Other (See Comments)    Causes heart rhythm issues    Epinephrine Other (See Comments)    Causes A-Fib    Penicillins Anaphylaxis    Has patient had a PCN reaction causing immediate rash, facial/tongue/throat swelling, SOB or lightheadedness with hypotension: Yes Has patient had a PCN reaction causing severe rash involving mucus membranes or skin necrosis: No Has patient had a PCN reaction that required hospitalization No Has patient had a PCN reaction  occurring within the last 10 years: No If all of the above answers are "NO", then may proceed with Cephalosporin use.    Lidocaine Palpitations   Metoprolol Other (See Comments)    alopecia     Immunization History  Administered Date(s) Administered   Influenza, High Dose Seasonal PF 04/18/2017, 03/11/2018   Influenza,inj,quad, With Preservative 04/15/2017   Influenza-Unspecified 03/08/2013, 04/15/2017, 03/11/2018   PFIZER(Purple Top)SARS-COV-2 Vaccination 07/01/2019, 07/22/2019   Pneumococcal Polysaccharide-23 10/09/2012  Pneumococcal-Unspecified 10/14/2016    Past Medical History:  Diagnosis Date   Anticoagulant long-term use  WITH PRADAXA 05/27/2011   Anxiety    Cellulitis of buttock, left 72/02/4708   Complication of anesthesia    DIFFICULTY WAKING UP   Depression    Diverticulosis    DJD (degenerative joint disease)    Esophagitis 09/17/06   External hemorrhoids    GERD (gastroesophageal reflux disease)    Glaucoma    History of stress test    a. 2008 - normal nuc.   Hypertension    Hypothyroid    IBS (irritable bowel syndrome)    Paroxysmal A-fib ([place]) diagnosed 2008   a. On propafenone, Pradaxa.   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   s/p MDT PPM by Dr Recardo Evangelist    Tobacco History: Social History   Tobacco Use  Smoking Status Former   Types: Cigarettes   Quit date: 08/30/1996   Years since quitting: 25.8  Smokeless Tobacco Never   Counseling given: Not Answered   Continue to not smoke  Outpatient Encounter Medications as of 06/24/2022  Medication Sig   clobetasol (TEMOVATE) 0.05 % external solution Apply 1 application topically 2  times daily to affected area(s) as needed, Do not apply to face,groin or armpit (Patient taking differently: Apply 1 application  topically 2 (two) times daily as needed (to affected areas of the scalp- avoid face/groin/armpits).)   digoxin (LANOXIN)  0.125 MG tablet Take 1 tablet (0.125 mg total) by mouth every other day.   diltiazem (CARTIA XT) 120 MG 24 hr capsule Take 1 capsule (120 mg total) by mouth at bedtime. Keep upcoming appointment for future refills.   escitalopram (LEXAPRO) 20 MG tablet Take 1 tablet (20 mg total) by mouth daily.   HYDROcodone-acetaminophen (NORCO/VICODIN) 5-325 MG tablet Take 1 tablet by mouth every 6 (six) hours as needed.   hydroxychloroquine (PLAQUENIL) 200 MG tablet Take 1.5 tablets (300 mg total) by mouth daily with food or milk   levothyroxine (SYNTHROID) 112 MCG tablet Take 1 tablet (112 mcg total) by mouth daily.   midodrine (PROAMATINE) 5 MG tablet Take 1 tablet (5 mg total) by mouth every 6 (six) hours as needed for a low blood pressure. Do not take 4 hours before bed. Do not take for a systolic blood pressure over 120.   nitrofurantoin (MACRODANTIN) 50 MG capsule Take 1 capsule (50 mg total) by mouth daily.   predniSONE (DELTASONE) 10 MG tablet Take 2 tablets  by mouth daily with breakfast for 14 days, THEN 1.5 tablets daily with breakfast for 28 days, THEN 1 tablet daily with breakfast for 28 days, THEN 0.5 tablets daily with breakfast for 28 days.   traZODone (DESYREL) 150 MG tablet Take 0.5 tablets (75 mg total) by mouth once a day in the evening. (Patient taking differently: Take 75 mg by mouth at bedtime.)   triamcinolone cream (KENALOG) 0.1 % Apply 1 application to affected area(s) up to 2 (two) times daily as needed. Do not apply to face, groin, or armpit. (Patient taking differently: Apply 1 application  topically 2 (two) times daily as needed (to affected areas, for contact dermatitis- avoid facegroin/armpits).)   [DISCONTINUED] sulfamethoxazole-trimethoprim (BACTRIM) 400-80 MG tablet Take 1 tablet by mouth 3 (three) times a week. Monday, Wednesday, Friday.   [DISCONTINUED] HYDROcodone-acetaminophen (NORCO/VICODIN) 5-325 MG tablet Take 1 tablet by mouth every 6 (six) hours as needed. (Patient taking  differently: Take 1 tablet  by mouth 4 (four) times daily.)   [DISCONTINUED] HYDROcodone-acetaminophen (NORCO/VICODIN) 5-325 MG tablet Take 1 tablet by mouth every 6 (six) hours as needed.   No facility-administered encounter medications on file as of 06/24/2022.     Review of Systems  Review of Systems  N/a Physical Exam  BP 128/64 (BP Location: Left Arm, Patient Position: Sitting, Cuff Size: Normal)   Pulse 66   Temp 98.9 F (37.2 C) (Oral)   Wt 136 lb 9.6 oz (62 kg)   SpO2 98%   BMI 24.20 kg/m   Wt Readings from Last 5 Encounters:  06/24/22 136 lb 9.6 oz (62 kg)  03/26/22 123 lb (55.8 kg)  03/14/22 119 lb (54 kg)  03/04/22 112 lb 12.8 oz (51.2 kg)  01/31/22 118 lb 6.2 oz (53.7 kg)    BMI Readings from Last 5 Encounters:  06/24/22 24.20 kg/m  03/26/22 21.79 kg/m  03/14/22 21.08 kg/m  03/04/22 19.98 kg/m  01/31/22 20.97 kg/m     Physical Exam General: Frail, elderly, sitting in chair Eyes: EOMI, no icterus Neck: Supple, no JVP appreciated sitting upright Pulmonary: Clear, prior crackles no longer heard, normal work of breathing on 2 L nasal cannula Cardiovascular: No lower extremity edema, warm Abdomen: Nondistended, bowel sounds present MSK: No synovitis, no joint effusion Neuro: Slow gait, no focal weakness Psych: Alert, attentive, normal mood   Assessment & Plan:   Interstitial and alveolar filling/GGO's on CT scan due to 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  Initially, certainly a component of volume overload given concomitant moderate to large pleural effusions on initial CT scan 01/29/2022.  The presence of relative volume overload makes further radiographic evaluation very challenging to near impossible.  CT scan abdomen pelvis 03/03/2022 demonstrates improved size of pleural effusions, less GGO's, ongoing interstitial infiltrates.  Again, element overload is suspected.  However, compared to CT abdomen pelvis in 2020, with presence of similar  sized pleural effusions and lack of interstitial infiltrates, other etiologies are possible.  Curiously, CRP and sed rate within normal limits on initial evaluation.  It is possible symptoms and imaging do represent amiodarone toxicity or some other initial lung disease but the lack of inflammatory markers is concerning.  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 she has nearly completed a total of 4 months of prednisone therapy.  She stopped Bactrim for PJP prophylaxis once at 15 mg doses and less.  She continues on oxygen therapy.  Repeat high-res CT scan now.  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  Again the bilateral pleural effusions do not fit very well but attempted diuresis have been unsuccessful per cardiology.    Return in about 3 months (around 09/23/2022).   Karren Burly, MD 06/24/2022  I spent 41 minutes in the care of the patient including review of records, face-to-face visit, coordination of care.

## 2022-06-24 NOTE — Addendum Note (Signed)
Addended by: Monna Fam L on: 06/24/2022 01:45 PM   Modules accepted: Orders

## 2022-06-24 NOTE — Patient Instructions (Addendum)
Nice to see you again  Continue the prednisone 5 mg daily  We will get a CT scan in the coming days to decide next steps, we will contact Claiborne Billings via phone  Return to clinic in 3 months or sooner as needed with Dr. Silas Flood

## 2022-06-27 ENCOUNTER — Other Ambulatory Visit (HOSPITAL_COMMUNITY): Payer: Self-pay

## 2022-06-27 MED ORDER — CIPROFLOXACIN HCL 500 MG PO TABS
500.0000 mg | ORAL_TABLET | Freq: Two times a day (BID) | ORAL | 0 refills | Status: DC
  Filled 2022-06-27: qty 10, 5d supply, fill #0

## 2022-06-28 ENCOUNTER — Ambulatory Visit (HOSPITAL_BASED_OUTPATIENT_CLINIC_OR_DEPARTMENT_OTHER)
Admission: RE | Admit: 2022-06-28 | Discharge: 2022-06-28 | Disposition: A | Discharge status: Home / Self Care | Payer: Medicare Other | Source: Ambulatory Visit | Attending: Pulmonary Disease | Admitting: Pulmonary Disease

## 2022-06-28 DIAGNOSIS — T462X1S Poisoning by other antidysrhythmic drugs, accidental (unintentional), sequela: Secondary | ICD-10-CM | POA: Insufficient documentation

## 2022-06-28 DIAGNOSIS — J702 Acute drug-induced interstitial lung disorders: Secondary | ICD-10-CM | POA: Diagnosis present

## 2022-06-29 NOTE — Progress Notes (Signed)
Please contact daughter and inform her CT with residual fibrosis or scarring. Infiltrates improved from prior in August of 2023. Suspect that additional steroids are not going to help. Suspect this is new baseline.

## 2022-07-01 ENCOUNTER — Encounter: Payer: Self-pay | Admitting: Pulmonary Disease

## 2022-07-01 ENCOUNTER — Other Ambulatory Visit (HOSPITAL_COMMUNITY): Payer: Self-pay

## 2022-07-04 ENCOUNTER — Other Ambulatory Visit (HOSPITAL_COMMUNITY): Payer: Self-pay

## 2022-07-04 MED ORDER — HYDROCODONE-ACETAMINOPHEN 5-325 MG PO TABS
1.0000 | ORAL_TABLET | Freq: Four times a day (QID) | ORAL | 0 refills | Status: DC | PRN
  Filled 2022-07-04: qty 120, 30d supply, fill #0

## 2022-07-07 ENCOUNTER — Encounter: Payer: Self-pay | Admitting: Cardiovascular Disease

## 2022-07-07 ENCOUNTER — Ambulatory Visit: Payer: Medicare Other | Attending: Cardiovascular Disease | Admitting: Cardiovascular Disease

## 2022-07-07 VITALS — BP 123/69 | HR 62 | Ht 63.0 in | Wt 134.4 lb

## 2022-07-07 DIAGNOSIS — Z5181 Encounter for therapeutic drug level monitoring: Secondary | ICD-10-CM | POA: Insufficient documentation

## 2022-07-07 DIAGNOSIS — I480 Paroxysmal atrial fibrillation: Secondary | ICD-10-CM | POA: Diagnosis present

## 2022-07-07 DIAGNOSIS — J9611 Chronic respiratory failure with hypoxia: Secondary | ICD-10-CM | POA: Insufficient documentation

## 2022-07-07 DIAGNOSIS — I10 Essential (primary) hypertension: Secondary | ICD-10-CM | POA: Insufficient documentation

## 2022-07-07 DIAGNOSIS — Z950 Presence of cardiac pacemaker: Secondary | ICD-10-CM | POA: Diagnosis present

## 2022-07-07 DIAGNOSIS — I495 Sick sinus syndrome: Secondary | ICD-10-CM | POA: Diagnosis not present

## 2022-07-07 DIAGNOSIS — Z79899 Other long term (current) drug therapy: Secondary | ICD-10-CM | POA: Insufficient documentation

## 2022-07-07 DIAGNOSIS — M069 Rheumatoid arthritis, unspecified: Secondary | ICD-10-CM | POA: Insufficient documentation

## 2022-07-07 DIAGNOSIS — R4189 Other symptoms and signs involving cognitive functions and awareness: Secondary | ICD-10-CM | POA: Insufficient documentation

## 2022-07-07 NOTE — Patient Instructions (Signed)
Medication Instructions:  No changes *If you need a refill on your cardiac medications before your next appointment, please call your pharmacy*  Follow-Up: At [facility], you and your health needs are our priority.  As part of our continuing mission to provide you with exceptional heart care, we have created designated Provider Care Teams.  These Care Teams include your primary Cardiologist (physician) and Advanced Practice Providers (APPs -  Physician Assistants and Nurse Practitioners) who all work together to provide you with the care you need, when you need it.  We recommend signing up for the patient portal called "MyChart".  Sign up information is provided on this After Visit Summary.  MyChart is used to connect with patients for Virtual Visits (Telemedicine).  Patients are able to view lab/test results, encounter notes, upcoming appointments, etc.  Non-urgent messages can be sent to your provider as well.   To learn more about what you can do with MyChart, go to [email or web address].    Your next appointment:   1 year(s)  Provider:   Dr Croitoru  

## 2022-07-07 NOTE — Progress Notes (Unsigned)
Cardiology Office Note    Date:  07/10/2022   ID:  Jacqueline Kim, DOB April 13, 1946, MRN [id number]  PCP:  Jacqueline Kim, Jacqueline Kim  Cardiologist:  Jacqueline Kim, Jacqueline Kim; Jacqueline Kim, Jacqueline Kim   No chief complaint on file.   History of Present Illness:  Jacqueline Kim is a 77 y.o. female with paroxysmal atrial fibrillation and sinus node dysfunction, here for pacemaker check.  She is accompanied by her daughter.  She recently saw Dr. Tresa Kim in clinic.  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  After years of good response to treatment with propafenone she developed frequent episodes of atrial fibrillation rapid ventricular response, so we switched to amiodarone in April 2023.  This led to a remarkable reduction in the burden of atrial fibrillation, but unfortunately on January 25, 2022 she was admitted with acute hypoxemic respiratory failure, with chest x-ray findings suggestive of multifocal pneumonia.  She was mildly hyponatremic.  CT angiogram August 15 was negative for PE but showed moderate bilateral pleural effusions and upper lobe infiltrate suspicious for pulmonary edema versus atypical infection.  She was given IV furosemide, but she promptly developed worsening renal function.  Urinalysis showed abnormal findings in urine cultures grew Serratia.  She was treated with antibiotics for this and for suspected atypical pneumonia.  She was given a tapering dose of prednisone, as well as diuretics.  Echo showed normal left ventricular systolic function with EF 60-65% and there was moderately elevated PA pressure.  She failed to improve after discharge and remains on oxygen.  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  We advised her to stop the amiodarone on 02/06/2022.  ESR and CRP were normal.   Empirical treatment with anti-inflammatory medications unfortunately has not led to any improvement in lung abnormalities and she  is no longer taking steroids.  Follow-up high-resolution chest CT performed last week "at least mild underlying pulmonary fibrosis with slight upper lobe predominance".  She is now using oxygen at 3 L/min by nasal cannula and occasionally has to increase to 4 L during activity.  Her oxygen saturation will drop to as low as 77% with minimal activity.  She has functional class III exertional dyspnea.  She has not had chest pain, palpitations, dizziness or syncope.  She is unaware of the atrial fibrillation when it occurs.  She has been seeing Dr. Dierdre Kim for rheumatoid arthritis and we are trying to find an alternative medication for her joint symptoms.  After stopping the steroids that she was taking for her lungs, her joint complaints have worsened.  She is currently just on hydroxychloroquine.  She remains very prone to falls.  Thankfully she has not had any new serious injuries.  On 03/03/2022 she had a CT of the abdomen and pelvis, for left lower quadrant abdominal pain.  There were no acute abdominal pelvic findings but there was evidence of pulmonary abnormalities with interlobular septal thickening throughout the imaged lung bases suggestive of pulmonary edema.  Note that on 01/25/2022 the BNP was only 16. On 03/07/2022 her BNP was 165, very mildly abnormal and maybe even in normal range for her age, although higher than at her previous assessment in August.  She underwent pacemaker generator change out on October 07, 2021.  The site has healed very nicely.  The device sticks out rather prominently since she has lost so much weight, but the overlying skin appears healthy and there is no inflammatory change.  Pacemaker interrogation shows  a stable burden of atrial fibrillation at about 3%, sometimes with rapid ventricular rates (when she has a pattern more suggestive of atrial tachycardia or organized atrial flutter).  Pacemaker interrogation shows that her overall burden of atrial fibrillation is  around 2-3% and has not really changed since her amiodarone was discontinued.  She has 82 % atrial pacing and less than 0.1 % ventricular pacing.  The generator is at beginning of life with estimated longevity of over 13 years.  Liver and thyroid function tests were normal on September 22.  Hemoglobin was 11.3, WBC 8.1K, creatinine 0.99, potassium 3.8, BNP 165.  Past Medical History:  Diagnosis Date   Anticoagulant long-term use  WITH PRADAXA 05/27/2011   Anxiety    Cellulitis of buttock, left 12/20/2014   Complication of anesthesia    DIFFICULTY WAKING UP   Depression    Diverticulosis    DJD (degenerative joint disease)    Esophagitis 09/17/06   External hemorrhoids    GERD (gastroesophageal reflux disease)    Glaucoma    History of stress test    a. 2008 - normal nuc.   Hypertension    Hypothyroid    IBS (irritable bowel syndrome)    Paroxysmal A-fib (HCC) diagnosed 2008   a. On propafenone, Pradaxa.   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   s/p MDT PPM by Dr Jacqueline Kim    Past Surgical History:  Procedure Laterality Date   BREAST REDUCTION SURGERY     BUNIONECTOMY Right 07/09/2015   with rods and pins   CARDIOVERSION N/A 08/02/2014   Procedure: CARDIOVERSION;  Surgeon: Jacqueline Kim, Jacqueline Kim;  Location: [facility];  Service: Cardiovascular;  Laterality: N/A;   CATARACT EXTRACTION, BILATERAL Bilateral    FACIAL COSMETIC SURGERY     FOOT SURGERY Left    HAND SURGERY     multiple   LIPOSUCTION     NM MYOCAR PERF WALL MOTION  12/29/2006   No significant ischemia; EF 69%   OVARIAN CYST REMOVAL     PACEMAKER INSERTION  04/08/11   MDT Revo implanted by Dr Jacqueline Kim   PPM GENERATOR CHANGEOUT N/A 10/07/2021   Procedure: PPM GENERATOR CHANGEOUT;  Surgeon: Jacqueline Kim, Jacqueline Kim;  Location: [facility];  Service: Cardiovascular;  Laterality: N/A;   TONSILLECTOMY     TOTAL KNEE ARTHROPLASTY Left 05/01/2015    Procedure: TOTAL LEFT KNEE ARTHROPLASTY;  Surgeon: Jacqueline Kim, Jacqueline Kim;  Location: [facility];  Service: Orthopedics;  Laterality: Left;   VAGINAL HYSTERECTOMY      Current Medications: Outpatient Medications Prior to Visit  Medication Sig Dispense Refill   clobetasol (TEMOVATE) 0.05 % external solution Apply 1 application topically 2  times daily to affected area(s) as needed, Do not apply to face,groin or armpit (Patient taking differently: Apply 1 application  topically 2 (two) times daily as needed (to affected areas of the scalp- avoid face/groin/armpits).) 50 mL 3   digoxin (LANOXIN) 0.125 MG tablet Take 1 tablet (0.125 mg total) by mouth every other day. 45 tablet 3   diltiazem (CARTIA XT) 120 MG 24 hr capsule Take 1 capsule (120 mg total) by mouth at bedtime. Keep upcoming appointment for future refills. 90 capsule 3   escitalopram (LEXAPRO) 20 MG tablet Take 1 tablet (20 mg total) by mouth daily. 90 tablet 2   HYDROcodone-acetaminophen (NORCO/VICODIN) 5-325 MG tablet Take 1 tablet by mouth every 6 (six) hours as needed. 120 tablet 0  hydroxychloroquine (PLAQUENIL) 200 MG tablet Take 1.5 tablets (300 mg total) by mouth daily with food or milk 45 tablet 2   levothyroxine (SYNTHROID) 112 MCG tablet Take 1 tablet (112 mcg total) by mouth daily. 30 tablet 3   midodrine (PROAMATINE) 5 MG tablet Take 1 tablet (5 mg total) by mouth every 6 (six) hours as needed for a low blood pressure. Do not take 4 hours before bed. Do not take for a systolic blood pressure over 120. 30 tablet 1   nitrofurantoin (MACRODANTIN) 50 MG capsule Take 1 capsule (50 mg total) by mouth daily. 30 capsule 6   traZODone (DESYREL) 150 MG tablet Take 0.5 tablets (75 mg total) by mouth once a day in the evening. (Patient taking differently: Take 75 mg by mouth at bedtime.) 30 tablet 6   triamcinolone cream (KENALOG) 0.1 % Apply 1 application to affected area(s) up to 2 (two) times daily as needed. Do not apply to face, groin, or  armpit. (Patient taking differently: Apply 1 application  topically 2 (two) times daily as needed (to affected areas, for contact dermatitis- avoid facegroin/armpits).) 454 g 3   ciprofloxacin (CIPRO) 500 MG tablet Take 1 tablet (500 mg total) by mouth every 12 (twelve) hours for 5 days 10 tablet 0   No facility-administered medications prior to visit.     Allergies:   Azithromycin, Epinephrine, Penicillins, Lidocaine, and Metoprolol   Social History   Socioeconomic History   Marital status: Divorced    Spouse name: Not on file   Number of children: 3   Years of education: Not on file   Highest education level: Not on file  Occupational History   Occupation: retired    Associate Professor: RETIRED  Tobacco Use   Smoking status: Former    Types: Cigarettes    Quit date: 08/30/1996    Years since quitting: 25.8   Smokeless tobacco: Never  Substance and Sexual Activity   Alcohol use: Yes    Alcohol/week: 14.0 standard drinks of alcohol    Types: 14 Glasses of wine per week    Comment: daily   Drug use: No   Sexual activity: Yes  Other Topics Concern   Not on file  Social History Narrative   Lives in [place],  Former Engineer, civil (consulting),  Separated.     Social Determinants of Health   Financial Resource Strain: Not on file  Food Insecurity: Not on file  Transportation Needs: Not on file  Physical Activity: Not on file  Stress: Not on file  Social Connections: Not on file     Family History:  The patient's family history includes Anesthesia problems in her mother; Colon polyps in her mother.   ROS:   Please see the history of present illness.    All other systems are reviewed and are negative.   PHYSICAL EXAM:   VS:  BP 123/69   Pulse 62   Ht 5\' 3"  (1.6 m)   Wt 134 lb 6.4 oz (61 kg)   SpO2 99%   BMI 23.81 kg/m       General: Alert, oriented x3, no distress, Lean.  After substantial weight loss, the pacemaker site is very prominent, but the overlying skin is  healthy.   Head: no evidence of trauma, PERRL, EOMI, no exophtalmos or lid lag, no myxedema, no xanthelasma; normal ears, nose and oropharynx Neck: normal jugular venous pulsations and no hepatojugular reflux; brisk carotid pulses without delay and no carotid bruits Chest: clear to auscultation,  no signs of consolidation by percussion or palpation, normal fremitus, symmetrical and full respiratory excursions Cardiovascular: normal position and quality of the apical impulse, regular rhythm, normal first and second heart sounds, no murmurs, rubs or gallops Abdomen: no tenderness or distention, no masses by palpation, no abnormal pulsatility or arterial bruits, normal bowel sounds, no hepatosplenomegaly Extremities: no clubbing, cyanosis or edema; 2+ radial, ulnar and brachial pulses bilaterally; 2+ right femoral, posterior tibial and dorsalis pedis pulses; 2+ left femoral, posterior tibial and dorsalis pedis pulses; no subclavian or femoral bruits Neurological: grossly nonfocal Psych: Normal mood and affect    Wt Readings from Last 3 Encounters:  07/07/22 134 lb 6.4 oz (61 kg)  06/24/22 136 lb 9.6 oz (62 kg)  03/26/22 123 lb (55.8 kg)      Studies/Labs Reviewed:   EKG:  EKG is ordered today and personally reviewed.  It shows atrial paced, ventricular sensed rhythm.  AV delay is prolonged at 240 ms.  There is left axis deviation pulmonary disease pattern, incomplete right bundle branch block and left anterior fascicular block.  There are no ischemic repolarization abnormalities.  QTc 430 ms. Recent Labs: 09/19/2021: TSH 0.622 01/25/2022: B Natriuretic Peptide 16.4 01/26/2022: ALT 9 01/29/2022: Hemoglobin 10.1; Magnesium 2.1; Platelets 373 01/31/2022: BUN 28; Creatinine, Ser 1.11; Potassium 4.5; Sodium 135   03/07/2022 Labs from PCP BNP 165 TSH 0.838 Normal liver function tests  hemoglobin 11.3, WBC 8.1K creatinine 0.99, potassium 3.8, BNP 165.  Lipid Panel    Component Value Date/Time    CHOL  12/19/2006 0315    172        ATP III CLASSIFICATION:  <200     mg/dL   Desirable  200-239  mg/dL   Borderline High  >=240    mg/dL   High   TRIG 293 (H) 12/19/2006 0315   HDL 37 (L) 12/19/2006 0315   CHOLHDL 4.6 12/19/2006 0315   VLDL 59 (H) 12/19/2006 0315   LDLCALC  12/19/2006 0315    76        Total Cholesterol/HDL:CHD Risk Coronary Heart Disease Risk Table                     Men   Women  1/2 Average Risk   3.4   3.3    Additional studies/ records that were reviewed today include:  Notes from recent hospitalization.  ASSESSMENT:    1. Paroxysmal atrial fibrillation (HCC)   2. SSS (sick sinus syndrome) ([place])   3. Encounter for monitoring digoxin therapy   4. Chronic respiratory failure with hypoxia (HCC)   5. Pacemaker   6. Essential hypertension   7. Cognitive decline   8. Rheumatoid arthritis, involving unspecified site, unspecified whether rheumatoid factor present ([place])     PLAN:  In order of problems listed above:  AFib: Low burden of atrial arrhythmia, of which she is completely unaware.  When she has atrial fibrillation rate control is appropriate, but during periods of atrial flutter she has rapid ventricular rates.  She has not had any symptoms or signs of heart failure related to this.  Medications limited by side effects.  She is currently on the maximum tolerated dose of beta-blocker and digoxin for rate control. CHADSVasc 4 (age 54, gender, HTN) without history of stroke or TIA.  Anticoagulation has been discontinued due to her gait instability and frequent falls which have caused serious injuries.  Digoxin: Limited choices for ventricular rate control.  Increase digoxin to daily.  We will check a dig level (to be drawn at trough, before the administration of medication that day) after giving her some time to achieve a steady state. 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  No improvement with steroid therapy. SSS:  Virtually 100% atrial paced, ventricular sensed with a good heart rate histogram distribution with current sensor settings PPM: Well-healed.  Normal device function. HTN: Currently well-controlled.  She does have a history of symptomatic orthostatic hypotension, improved with midodrine.  Cognitive deficits: She appears to be partially oriented today.  Trying to avoid invasive procedures as much as possible. RA: Seeing Dr. Amil Amen.  Currently just on hydroxychloroquine.   Medication Adjustments/Labs and Tests Ordered: Current medicines are reviewed at length with the patient today.  Concerns regarding medicines are outlined above.  Medication changes, Labs and Tests ordered today are listed in the Patient Instructions below. Patient Instructions  Medication Instructions:  No changes *If you need a refill on your cardiac medications before your next appointment, please call your pharmacy*  Follow-Up: At [facility], you and your health needs are our priority.  As part of our continuing mission to provide you with exceptional heart care, we have created designated Provider Care Teams.  These Care Teams include your primary Cardiologist (physician) and Advanced Practice Providers (APPs -  Physician Assistants and Nurse Practitioners) who all work together to provide you with the care you need, when you need it.  We recommend signing up for the patient portal called "MyChart".  Sign up information is provided on this After Visit Summary.  MyChart is used to connect with patients for Virtual Visits (Telemedicine).  Patients are able to view lab/test results, encounter notes, upcoming appointments, etc.  Non-urgent messages can be sent to your provider as well.   To learn more about what you can do with MyChart, go to [email or web address].    Your next appointment:   1 year(s)  Provider:   Dr Sallyanne Kuster    Signed, Jacqueline Klein, Jacqueline Kim  07/10/2022 3:36 PM    [address], [place], [place]  [address] Phone: [phone]; Fax: [phone]

## 2022-07-08 ENCOUNTER — Ambulatory Visit: Payer: Medicare Other | Attending: Cardiovascular Disease

## 2022-07-08 DIAGNOSIS — I495 Sick sinus syndrome: Secondary | ICD-10-CM

## 2022-07-08 LAB — CUP PACEART REMOTE DEVICE CHECK
Battery Remaining Longevity: 155 mo
Battery Voltage: 3.12 V
Brady Statistic AP VP Percent: 0.05 %
Brady Statistic AP VS Percent: 82.07 %
Brady Statistic AS VP Percent: 0.02 %
Brady Statistic AS VS Percent: 17.87 %
Brady Statistic RA Percent Paced: 80.21 %
Brady Statistic RV Percent Paced: 0.25 %
Date Time Interrogation Session: 20240122212220
Implantable Lead Connection Status: 753985
Implantable Lead Connection Status: 753985
Implantable Lead Implant Date: 20121023
Implantable Lead Implant Date: 20121023
Implantable Lead Location: 753859
Implantable Lead Location: 753860
Implantable Pulse Generator Implant Date: 20230424
Lead Channel Impedance Value: 1045 Ohm
Lead Channel Impedance Value: 1387 Ohm
Lead Channel Impedance Value: 342 Ohm
Lead Channel Impedance Value: 399 Ohm
Lead Channel Pacing Threshold Amplitude: 0.5 V
Lead Channel Pacing Threshold Amplitude: 1.875 V
Lead Channel Pacing Threshold Pulse Width: 0.4 ms
Lead Channel Pacing Threshold Pulse Width: 0.4 ms
Lead Channel Sensing Intrinsic Amplitude: 2.5 mV
Lead Channel Sensing Intrinsic Amplitude: 2.5 mV
Lead Channel Sensing Intrinsic Amplitude: 8.625 mV
Lead Channel Sensing Intrinsic Amplitude: 8.625 mV
Lead Channel Setting Pacing Amplitude: 1.5 V
Lead Channel Setting Pacing Amplitude: 3.5 V
Lead Channel Setting Pacing Pulse Width: 0.7 ms
Lead Channel Setting Sensing Sensitivity: 0.9 mV
Zone Setting Status: 755011
Zone Setting Status: 755011

## 2022-07-09 ENCOUNTER — Telehealth: Payer: Self-pay | Admitting: Pulmonary Disease

## 2022-07-09 ENCOUNTER — Other Ambulatory Visit (HOSPITAL_COMMUNITY): Payer: Self-pay

## 2022-07-09 DIAGNOSIS — F0390 Unspecified dementia without behavioral disturbance: Secondary | ICD-10-CM

## 2022-07-09 DIAGNOSIS — J9611 Chronic respiratory failure with hypoxia: Secondary | ICD-10-CM

## 2022-07-09 MED ORDER — PREDNISONE 10 MG PO TABS
ORAL_TABLET | ORAL | 0 refills | Status: AC
  Filled 2022-07-09: qty 18, 15d supply, fill #0

## 2022-07-09 NOTE — Telephone Encounter (Signed)
Rx for prednisone has been sent. New message sent was asking if you thought that pt might be eligible for additional care such as palliative care for additional support.  Dr. Silas Flood, please advise on this.

## 2022-07-09 NOTE — Telephone Encounter (Signed)
Can try a short course of higher dose prednisone - 20 mg for 5 days, 10 mg for 5 days, then 5 mg for 5 days then stop. Other option is to see if we have openings for acute visit this week or go to ED if worsens.

## 2022-07-09 NOTE — Telephone Encounter (Signed)
Dr. Silas Flood, please see mychart messages sent on pt and advise, especially the most recent ones.

## 2022-07-09 NOTE — Telephone Encounter (Signed)
Discussed ongoing care with daughter.  We discussed obtaining more resources to help at home.  Help with symptoms, palliate symptoms.  She has chronic hypoxemia, underlying dementia.  She qualifies for hospice.  I will place referral today.  Daughter is amenable to this.

## 2022-07-15 ENCOUNTER — Other Ambulatory Visit (HOSPITAL_COMMUNITY): Payer: Self-pay

## 2022-07-17 ENCOUNTER — Other Ambulatory Visit: Payer: Self-pay

## 2022-07-17 ENCOUNTER — Other Ambulatory Visit (HOSPITAL_COMMUNITY): Payer: Self-pay

## 2022-07-17 MED ORDER — LORAZEPAM 0.5 MG PO TABS
0.5000 mg | ORAL_TABLET | ORAL | 0 refills | Status: DC | PRN
  Filled 2022-07-17: qty 45, 8d supply, fill #0

## 2022-07-18 ENCOUNTER — Other Ambulatory Visit (HOSPITAL_COMMUNITY): Payer: Self-pay

## 2022-07-21 ENCOUNTER — Other Ambulatory Visit (HOSPITAL_COMMUNITY): Payer: Self-pay

## 2022-07-21 MED ORDER — IPRATROPIUM-ALBUTEROL 0.5-2.5 (3) MG/3ML IN SOLN
3.0000 mL | Freq: Four times a day (QID) | RESPIRATORY_TRACT | 1 refills | Status: DC | PRN
  Filled 2022-07-21: qty 90, 8d supply, fill #0

## 2022-07-22 ENCOUNTER — Other Ambulatory Visit (HOSPITAL_COMMUNITY): Payer: Self-pay

## 2022-07-23 ENCOUNTER — Other Ambulatory Visit (HOSPITAL_BASED_OUTPATIENT_CLINIC_OR_DEPARTMENT_OTHER): Payer: Self-pay

## 2022-07-23 ENCOUNTER — Other Ambulatory Visit (HOSPITAL_COMMUNITY): Payer: Self-pay

## 2022-07-23 MED ORDER — ALPRAZOLAM 0.5 MG PO TABS
0.5000 mg | ORAL_TABLET | ORAL | 0 refills | Status: DC | PRN
  Filled 2022-07-23: qty 180, 30d supply, fill #0

## 2022-07-24 ENCOUNTER — Other Ambulatory Visit (HOSPITAL_BASED_OUTPATIENT_CLINIC_OR_DEPARTMENT_OTHER): Payer: Self-pay

## 2022-07-24 ENCOUNTER — Encounter (HOSPITAL_COMMUNITY): Payer: Self-pay | Admitting: *Deleted

## 2022-08-05 ENCOUNTER — Other Ambulatory Visit (HOSPITAL_COMMUNITY): Payer: Self-pay

## 2022-08-06 ENCOUNTER — Other Ambulatory Visit (HOSPITAL_COMMUNITY): Payer: Self-pay

## 2022-08-07 ENCOUNTER — Encounter: Payer: Self-pay | Admitting: Cardiovascular Disease

## 2022-08-07 ENCOUNTER — Encounter: Payer: Self-pay | Admitting: Pulmonary Disease

## 2022-08-07 NOTE — Telephone Encounter (Signed)
FYI to Dr. Silas Flood of pt passing.

## 2022-08-15 NOTE — Progress Notes (Signed)
Remote pacemaker transmission.   

## 2022-08-15 DEATH — deceased

## 2022-08-18 ENCOUNTER — Other Ambulatory Visit (HOSPITAL_COMMUNITY): Payer: Self-pay

## 2022-10-07 ENCOUNTER — Ambulatory Visit: Payer: Medicare Other

## 2023-01-06 ENCOUNTER — Ambulatory Visit: Payer: Medicare Other

## 2023-09-19 ENCOUNTER — Other Ambulatory Visit (HOSPITAL_COMMUNITY): Payer: Self-pay
